# Patient Record
Sex: Male | Born: 1935 | Race: White | Hispanic: No | Marital: Married | Smoking: Former smoker
Health system: Southern US, Community
[De-identification: ages and names within clinical notes are randomized; demographics above are authoritative.]

## PROBLEM LIST (undated history)

## (undated) DIAGNOSIS — I4891 Unspecified atrial fibrillation: Secondary | ICD-10-CM

## (undated) DIAGNOSIS — N028 Recurrent and persistent hematuria with other morphologic changes: Secondary | ICD-10-CM

## (undated) DIAGNOSIS — D649 Anemia, unspecified: Secondary | ICD-10-CM

## (undated) DIAGNOSIS — I10 Essential (primary) hypertension: Secondary | ICD-10-CM

## (undated) DIAGNOSIS — I350 Nonrheumatic aortic (valve) stenosis: Secondary | ICD-10-CM

## (undated) DIAGNOSIS — H9190 Unspecified hearing loss, unspecified ear: Secondary | ICD-10-CM

## (undated) DIAGNOSIS — I2510 Atherosclerotic heart disease of native coronary artery without angina pectoris: Secondary | ICD-10-CM

## (undated) DIAGNOSIS — M1990 Unspecified osteoarthritis, unspecified site: Secondary | ICD-10-CM

## (undated) DIAGNOSIS — N02B9 Other recurrent and persistent immunoglobulin A nephropathy: Secondary | ICD-10-CM

## (undated) DIAGNOSIS — R011 Cardiac murmur, unspecified: Secondary | ICD-10-CM

## (undated) DIAGNOSIS — E119 Type 2 diabetes mellitus without complications: Secondary | ICD-10-CM

## (undated) DIAGNOSIS — E785 Hyperlipidemia, unspecified: Secondary | ICD-10-CM

## (undated) DIAGNOSIS — G510 Bell's palsy: Secondary | ICD-10-CM

## (undated) DIAGNOSIS — Z87442 Personal history of urinary calculi: Secondary | ICD-10-CM

## (undated) DIAGNOSIS — I499 Cardiac arrhythmia, unspecified: Secondary | ICD-10-CM

## (undated) DIAGNOSIS — N390 Urinary tract infection, site not specified: Secondary | ICD-10-CM

## (undated) DIAGNOSIS — I509 Heart failure, unspecified: Secondary | ICD-10-CM

## (undated) DIAGNOSIS — C9110 Chronic lymphocytic leukemia of B-cell type not having achieved remission: Secondary | ICD-10-CM

## (undated) HISTORY — DX: Type 2 diabetes mellitus without complications: E11.9

## (undated) HISTORY — DX: Other recurrent and persistent immunoglobulin A nephropathy: N02.B9

## (undated) HISTORY — DX: Hyperlipidemia, unspecified: E78.5

## (undated) HISTORY — DX: Unspecified atrial fibrillation: I48.91

## (undated) HISTORY — DX: Recurrent and persistent hematuria with other morphologic changes: N02.8

## (undated) HISTORY — DX: Essential (primary) hypertension: I10

## (undated) HISTORY — PX: TRANSURETHRAL RESECTION OF PROSTATE: SHX73

## (undated) HISTORY — DX: Chronic lymphocytic leukemia of B-cell type not having achieved remission: C91.10

## (undated) HISTORY — DX: Atherosclerotic heart disease of native coronary artery without angina pectoris: I25.10

## (undated) HISTORY — PX: ANAL FISSURE REPAIR: SHX2312

## (undated) HISTORY — PX: TONSILLECTOMY: SUR1361

## (undated) HISTORY — PX: FEMORAL HERNIA REPAIR: SHX632

---

## 2000-11-25 ENCOUNTER — Encounter: Payer: Self-pay | Admitting: Specialist

## 2000-11-25 ENCOUNTER — Ambulatory Visit: Admission: RE | Admit: 2000-11-25 | Discharge: 2000-11-25 | Payer: Self-pay | Admitting: Specialist

## 2001-01-11 ENCOUNTER — Inpatient Hospital Stay (HOSPITAL_COMMUNITY): Admission: RE | Admit: 2001-01-11 | Discharge: 2001-01-16 | Payer: Self-pay | Admitting: Specialist

## 2003-03-27 ENCOUNTER — Ambulatory Visit (HOSPITAL_COMMUNITY): Admission: RE | Admit: 2003-03-27 | Discharge: 2003-03-27 | Payer: Self-pay | Admitting: Orthopedic Surgery

## 2003-03-27 ENCOUNTER — Encounter: Payer: Self-pay | Admitting: Orthopedic Surgery

## 2004-01-18 ENCOUNTER — Ambulatory Visit (HOSPITAL_COMMUNITY): Admission: RE | Admit: 2004-01-18 | Discharge: 2004-01-18 | Payer: Self-pay | Admitting: Internal Medicine

## 2004-06-20 ENCOUNTER — Ambulatory Visit (HOSPITAL_COMMUNITY): Admission: RE | Admit: 2004-06-20 | Discharge: 2004-06-20 | Payer: Self-pay | Admitting: Internal Medicine

## 2004-09-02 ENCOUNTER — Ambulatory Visit (HOSPITAL_COMMUNITY): Admission: RE | Admit: 2004-09-02 | Discharge: 2004-09-02 | Payer: Self-pay | Admitting: General Surgery

## 2005-03-02 ENCOUNTER — Inpatient Hospital Stay (HOSPITAL_COMMUNITY): Admission: RE | Admit: 2005-03-02 | Discharge: 2005-03-04 | Payer: Self-pay | Admitting: Urology

## 2005-03-02 ENCOUNTER — Encounter (INDEPENDENT_AMBULATORY_CARE_PROVIDER_SITE_OTHER): Payer: Self-pay | Admitting: Urology

## 2006-08-23 ENCOUNTER — Inpatient Hospital Stay (HOSPITAL_COMMUNITY): Admission: EM | Admit: 2006-08-23 | Discharge: 2006-08-26 | Payer: Self-pay | Admitting: Emergency Medicine

## 2006-08-23 ENCOUNTER — Ambulatory Visit: Payer: Self-pay | Admitting: Orthopedic Surgery

## 2006-08-24 ENCOUNTER — Encounter: Payer: Self-pay | Admitting: Orthopedic Surgery

## 2006-09-02 ENCOUNTER — Ambulatory Visit: Payer: Self-pay | Admitting: Orthopedic Surgery

## 2006-09-20 ENCOUNTER — Ambulatory Visit: Payer: Self-pay | Admitting: Orthopedic Surgery

## 2006-10-05 ENCOUNTER — Ambulatory Visit: Payer: Self-pay | Admitting: Orthopedic Surgery

## 2006-10-18 ENCOUNTER — Ambulatory Visit: Payer: Self-pay | Admitting: Orthopedic Surgery

## 2006-11-18 ENCOUNTER — Ambulatory Visit: Payer: Self-pay | Admitting: Orthopedic Surgery

## 2007-06-03 ENCOUNTER — Encounter: Payer: Self-pay | Admitting: Orthopedic Surgery

## 2007-06-14 ENCOUNTER — Ambulatory Visit: Payer: Self-pay | Admitting: Orthopedic Surgery

## 2007-06-14 DIAGNOSIS — E119 Type 2 diabetes mellitus without complications: Secondary | ICD-10-CM

## 2007-06-14 DIAGNOSIS — M25569 Pain in unspecified knee: Secondary | ICD-10-CM

## 2007-07-05 ENCOUNTER — Ambulatory Visit: Payer: Self-pay | Admitting: Orthopedic Surgery

## 2007-08-12 ENCOUNTER — Encounter: Payer: Self-pay | Admitting: Orthopedic Surgery

## 2007-08-22 HISTORY — PX: OTHER SURGICAL HISTORY: SHX169

## 2008-05-01 ENCOUNTER — Ambulatory Visit: Payer: Self-pay | Admitting: Cardiology

## 2008-05-07 ENCOUNTER — Ambulatory Visit: Payer: Self-pay | Admitting: Cardiology

## 2008-05-17 ENCOUNTER — Inpatient Hospital Stay (HOSPITAL_BASED_OUTPATIENT_CLINIC_OR_DEPARTMENT_OTHER): Admission: RE | Admit: 2008-05-17 | Discharge: 2008-05-17 | Payer: Self-pay | Admitting: Cardiology

## 2008-05-17 ENCOUNTER — Ambulatory Visit: Payer: Self-pay | Admitting: Cardiovascular Disease

## 2008-05-21 ENCOUNTER — Ambulatory Visit: Payer: Self-pay | Admitting: Cardiology

## 2008-05-21 DIAGNOSIS — I4891 Unspecified atrial fibrillation: Secondary | ICD-10-CM

## 2008-05-21 DIAGNOSIS — E785 Hyperlipidemia, unspecified: Secondary | ICD-10-CM | POA: Insufficient documentation

## 2008-05-21 DIAGNOSIS — I2510 Atherosclerotic heart disease of native coronary artery without angina pectoris: Secondary | ICD-10-CM

## 2008-06-12 ENCOUNTER — Ambulatory Visit: Payer: Self-pay | Admitting: Cardiology

## 2008-06-19 ENCOUNTER — Ambulatory Visit: Payer: Self-pay | Admitting: Cardiology

## 2008-06-29 ENCOUNTER — Ambulatory Visit: Payer: Self-pay | Admitting: Cardiology

## 2008-07-13 ENCOUNTER — Ambulatory Visit: Payer: Self-pay | Admitting: Cardiology

## 2008-08-03 ENCOUNTER — Ambulatory Visit: Payer: Self-pay | Admitting: Cardiology

## 2008-09-05 ENCOUNTER — Ambulatory Visit: Payer: Self-pay

## 2008-10-05 ENCOUNTER — Ambulatory Visit: Payer: Self-pay | Admitting: Cardiology

## 2008-11-02 ENCOUNTER — Ambulatory Visit: Payer: Self-pay | Admitting: Cardiology

## 2008-11-19 ENCOUNTER — Encounter: Payer: Self-pay | Admitting: *Deleted

## 2008-11-30 ENCOUNTER — Ambulatory Visit: Payer: Self-pay | Admitting: Cardiology

## 2008-11-30 LAB — CONVERTED CEMR LAB
POC INR: 2
Prothrombin Time: 17.5 s

## 2008-12-21 ENCOUNTER — Ambulatory Visit: Payer: Self-pay | Admitting: Cardiology

## 2008-12-21 LAB — CONVERTED CEMR LAB: POC INR: 2

## 2009-01-16 ENCOUNTER — Encounter (INDEPENDENT_AMBULATORY_CARE_PROVIDER_SITE_OTHER): Payer: Self-pay | Admitting: *Deleted

## 2009-01-18 ENCOUNTER — Ambulatory Visit: Payer: Self-pay | Admitting: Cardiology

## 2009-02-08 ENCOUNTER — Telehealth (INDEPENDENT_AMBULATORY_CARE_PROVIDER_SITE_OTHER): Payer: Self-pay | Admitting: *Deleted

## 2009-02-26 ENCOUNTER — Ambulatory Visit: Payer: Self-pay | Admitting: Cardiology

## 2009-03-22 ENCOUNTER — Ambulatory Visit: Payer: Self-pay | Admitting: Cardiology

## 2009-03-22 LAB — CONVERTED CEMR LAB: POC INR: 2.6

## 2009-04-19 ENCOUNTER — Ambulatory Visit: Payer: Self-pay | Admitting: Cardiology

## 2009-05-17 ENCOUNTER — Ambulatory Visit: Payer: Self-pay | Admitting: Cardiology

## 2009-06-14 ENCOUNTER — Ambulatory Visit: Payer: Self-pay | Admitting: Cardiology

## 2009-06-14 LAB — CONVERTED CEMR LAB: POC INR: 3

## 2009-07-12 ENCOUNTER — Ambulatory Visit: Payer: Self-pay | Admitting: Cardiology

## 2009-07-12 LAB — CONVERTED CEMR LAB: POC INR: 2.2

## 2009-08-20 ENCOUNTER — Ambulatory Visit: Payer: Self-pay | Admitting: Cardiology

## 2009-08-20 LAB — CONVERTED CEMR LAB: POC INR: 2.1

## 2009-09-20 ENCOUNTER — Ambulatory Visit: Payer: Self-pay | Admitting: Cardiology

## 2009-09-20 LAB — CONVERTED CEMR LAB: POC INR: 1.9

## 2009-10-22 ENCOUNTER — Ambulatory Visit: Payer: Self-pay | Admitting: Cardiology

## 2009-10-22 LAB — CONVERTED CEMR LAB: POC INR: 1.8

## 2009-11-15 ENCOUNTER — Ambulatory Visit: Payer: Self-pay | Admitting: Cardiology

## 2009-11-15 LAB — CONVERTED CEMR LAB: POC INR: 2

## 2009-12-13 ENCOUNTER — Ambulatory Visit: Payer: Self-pay | Admitting: Cardiology

## 2010-01-10 ENCOUNTER — Ambulatory Visit: Payer: Self-pay | Admitting: Cardiology

## 2010-02-07 ENCOUNTER — Ambulatory Visit: Payer: Self-pay | Admitting: Cardiology

## 2010-02-07 LAB — CONVERTED CEMR LAB: POC INR: 3.4

## 2010-03-07 ENCOUNTER — Ambulatory Visit: Payer: Self-pay | Admitting: Cardiology

## 2010-04-04 ENCOUNTER — Ambulatory Visit: Payer: Self-pay | Admitting: Cardiology

## 2010-05-02 ENCOUNTER — Ambulatory Visit: Admission: RE | Admit: 2010-05-02 | Discharge: 2010-05-02 | Payer: Self-pay | Source: Home / Self Care

## 2010-05-02 LAB — CONVERTED CEMR LAB: POC INR: 2.2

## 2010-05-06 NOTE — Medication Information (Signed)
Summary: ccr-lr  Anticoagulant Therapy  Managed by: Vashti Hey, RN Supervising MD: Diona Browner MD, Remi Deter Indication 1: Atrial Fibrillation (ICD-427.31) [facility] [place] Site: [place] INR POC 2.2  Dietary changes: no    Health status changes: no    Bleeding/hemorrhagic complications: no    Recent/future hospitalizations: no    Any changes in medication regimen? no    Recent/future dental: no  Any missed doses?: no       Is patient compliant with meds? yes       Allergies: 1)  ! * Penicillin  Anticoagulation Management History:      The patient is taking warfarin and comes in today for a routine follow up visit.  Positive risk factors for bleeding include an age of 74 years or older and presence of serious comorbidities.  The bleeding index is 'intermediate risk'.  Positive CHADS2 values include History of Diabetes.  Negative CHADS2 values include Age > 74 years old.  The start date was 06/08/2008.  Anticoagulation responsible provider: Diona Browner MD, Remi Deter.  INR POC: 2.2.  Cuvette Lot#: [id number].  Exp: 07/10.    Anticoagulation Management Assessment/Plan:      The patient's current anticoagulation dose is Coumadin 5 mg tabs: Take 1-2 tablet by mouth once a day as directed. Contact our office for an appt..  The target INR is 2 - 3.  The next INR is due 05/17/2009.  Anticoagulation instructions were given to patient.  Results were reviewed/authorized by Vashti Hey, RN.  He was notified by Vashti Hey RN.         Prior Anticoagulation Instructions: INR 2.6 Continue coumadin 5mg  once daily except 7.5mg  on Tuesdays and Thursdays  Current Anticoagulation Instructions: INR 2.2 continue coumadin 5mg  once daily except 7.5mg  on Tuesdays and Thursdays

## 2010-05-06 NOTE — Medication Information (Signed)
Summary: ccr-lr  Anticoagulant Therapy  Managed by: Vashti Hey, RN Supervising MD: Diona Browner MD, Remi Deter Indication 1: Atrial Fibrillation (ICD-427.31) [facility] [place] Site: [place] INR POC 2.2  Dietary changes: no    Health status changes: no    Bleeding/hemorrhagic complications: no    Recent/future hospitalizations: no    Any changes in medication regimen? no    Recent/future dental: no  Any missed doses?: no       Is patient compliant with meds? yes       Allergies: 1)  ! * Penicillin  Anticoagulation Management History:      The patient is taking warfarin and comes in today for a routine follow up visit.  Positive risk factors for bleeding include an age of 76 years or older and presence of serious comorbidities.  The bleeding index is 'intermediate risk'.  Positive CHADS2 values include History of Diabetes.  Negative CHADS2 values include Age > 53 years old.  The start date was 06/08/2008.  Anticoagulation responsible provider: Diona Browner MD, Remi Deter.  INR POC: 2.2.  Cuvette Lot#: [id number].  Exp: 07/10.    Anticoagulation Management Assessment/Plan:      The patient's current anticoagulation dose is Coumadin 5 mg tabs: Take 1-2 tablet by mouth once a day as directed. Contact our office for an appt..  The target INR is 2 - 3.  The next INR is due 08/09/2009.  Anticoagulation instructions were given to patient.  Results were reviewed/authorized by Vashti Hey, RN.  He was notified by Vashti Hey RN.         Prior Anticoagulation Instructions: INR 3.0 Continue coumadin 5mg  once daily except 7.5mg  on Tuesdays and Thursdays  Current Anticoagulation Instructions: INR 2.2 Continue coumadin 5mg  once daily except 7.5mg  on T,Th

## 2010-05-06 NOTE — Medication Information (Signed)
Summary: ccr-lr  Anticoagulant Therapy  Managed by: Vashti Hey, RN Supervising MD: Myrtis Ser MD, Tinnie Gens Indication 1: Atrial Fibrillation (ICD-427.31) Lab [facility] [place] Site: [place] INR POC 1.8  Dietary changes: no    Health status changes: no    Bleeding/hemorrhagic complications: no    Recent/future hospitalizations: no    Any changes in medication regimen? no    Recent/future dental: no  Any missed doses?: no       Is patient compliant with meds? yes       Allergies: 1)  ! * Penicillin  Anticoagulation Management History:      The patient is taking warfarin and comes in today for a routine follow up visit.  Positive risk factors for bleeding include an age of 20 years or older and presence of serious comorbidities.  The bleeding index is 'intermediate risk'.  Positive CHADS2 values include History of Diabetes.  Negative CHADS2 values include Age > 22 years old.  The start date was 06/08/2008.  Anticoagulation responsible provider: Myrtis Ser MD, Tinnie Gens.  INR POC: 1.8.  Cuvette Lot#: [id number].  Exp: 07/10.    Anticoagulation Management Assessment/Plan:      The patient's current anticoagulation dose is Coumadin 5 mg tabs: Take 1-2 tablet by mouth once a day as directed. Contact our office for an appt..  The target INR is 2 - 3.  The next INR is due 11/15/2009.  Anticoagulation instructions were given to patient.  Results were reviewed/authorized by Vashti Hey, RN.  He was notified by Vashti Hey RN.         Prior Anticoagulation Instructions: INR 1.9 Take coumadin 10mg  tonight then resume 5mg  once daily except 7.5mg  on Tuesdays and Thursdays  Current Anticoagulation Instructions: INR 1.8 Take coumadin 10mg  tonight then increase dose to 5mg  once daily except 7.5mg  on T,Th,Sat

## 2010-05-06 NOTE — Medication Information (Signed)
Summary: ccr-lr  Anticoagulant Therapy  Managed by: Vashti Hey, RN Supervising MD: Diona Browner MD, Remi Deter Indication 1: Atrial Fibrillation (ICD-427.31) Lab [facility] [place] Site: [place] INR POC 3.0  Dietary changes: no    Health status changes: no    Bleeding/hemorrhagic complications: no    Recent/future hospitalizations: no    Any changes in medication regimen? no    Recent/future dental: no  Any missed doses?: no       Is patient compliant with meds? yes       Allergies: 1)  ! * Penicillin  Anticoagulation Management History:      The patient is taking warfarin and comes in today for a routine follow up visit.  Positive risk factors for bleeding include an age of 76 years or older and presence of serious comorbidities.  The bleeding index is 'intermediate risk'.  Positive CHADS2 values include History of Diabetes.  Negative CHADS2 values include Age > 21 years old.  The start date was 06/08/2008.  Anticoagulation responsible provider: Diona Browner MD, Remi Deter.  INR POC: 3.0.  Cuvette Lot#: [id number].  Exp: 07/10.    Anticoagulation Management Assessment/Plan:      The patient's current anticoagulation dose is Coumadin 5 mg tabs: Take 1-2 tablet by mouth once a day as directed. Contact our office for an appt..  The target INR is 2 - 3.  The next INR is due 07/12/2009.  Anticoagulation instructions were given to patient.  Results were reviewed/authorized by Vashti Hey, RN.         Prior Anticoagulation Instructions: INR 2.7 Continue coumadin 5mg  once daily except 7.5mg  on Tuesdays and Thursdays  Current Anticoagulation Instructions: INR 3.0 Continue coumadin 5mg  once daily except 7.5mg  on Tuesdays and Thursdays

## 2010-05-06 NOTE — Medication Information (Signed)
Summary: ccr-lr  Anticoagulant Therapy  Managed by: Vashti Hey, RN Supervising MD: Andee Lineman MD, Michelle Piper Indication 1: Atrial Fibrillation (ICD-427.31) Lab [facility] [place] Site: [place] INR POC 1.9  Dietary changes: no    Health status changes: no    Bleeding/hemorrhagic complications: no    Recent/future hospitalizations: no    Any changes in medication regimen? no    Recent/future dental: no  Any missed doses?: no       Is patient compliant with meds? yes       Allergies: 1)  ! * Penicillin  Anticoagulation Management History:      The patient is taking warfarin and comes in today for a routine follow up visit.  Positive risk factors for bleeding include an age of 76 years or older and presence of serious comorbidities.  The bleeding index is 'intermediate risk'.  Positive CHADS2 values include History of Diabetes.  Negative CHADS2 values include Age > 49 years old.  The start date was 06/08/2008.  Anticoagulation responsible provider: Andee Lineman MD, Michelle Piper.  INR POC: 1.9.  Cuvette Lot#: [id number].  Exp: 07/10.    Anticoagulation Management Assessment/Plan:      The patient's current anticoagulation dose is Coumadin 5 mg tabs: Take 1-2 tablet by mouth once a day as directed. Contact our office for an appt..  The target INR is 2 - 3.  The next INR is due 10/22/2009.  Anticoagulation instructions were given to patient.  Results were reviewed/authorized by Vashti Hey, RN.  He was notified by Vashti Hey RN.         Prior Anticoagulation Instructions: INR 2.1 Continue coumadin 5mg  once daily except 7.5mg  on Tuesdays and Thursdays  Current Anticoagulation Instructions: INR 1.9 Take coumadin 10mg  tonight then resume 5mg  once daily except 7.5mg  on Tuesdays and Thursdays

## 2010-05-06 NOTE — Medication Information (Signed)
Summary: ccr-lr  Anticoagulant Therapy  Managed by: Vashti Hey, RN Supervising MD: Diona Browner MD, Remi Deter Indication 1: Atrial Fibrillation (ICD-427.31) [facility]  Site: [place] INR POC 2.8  Dietary changes: no    Health status changes: no    Bleeding/hemorrhagic complications: no    Recent/future hospitalizations: no    Any changes in medication regimen? no    Recent/future dental: no  Any missed doses?: no       Is patient compliant with meds? yes       Allergies: 1)  ! * Penicillin  Anticoagulation Management History:      The patient is taking warfarin and comes in today for a routine follow up visit.  Positive risk factors for bleeding include an age of 28 years or older and presence of serious comorbidities.  The bleeding index is 'intermediate risk'.  Positive CHADS2 values include History of Diabetes.  Negative CHADS2 values include Age > 52 years old.  The start date was 06/08/2008.  Anticoagulation responsible provider: Diona Browner MD, Remi Deter.  INR POC: 2.8.  Cuvette Lot#: [id number].  Exp: 07/10.    Anticoagulation Management Assessment/Plan:      The patient's current anticoagulation dose is Coumadin 5 mg tabs: Take 1-2 tablet by mouth once a day as directed. Contact our office for an appt..  The target INR is 2 - 3.  The next INR is due 04/04/2010.  Anticoagulation instructions were given to patient.  Results were reviewed/authorized by Vashti Hey, RN.  He was notified by Vashti Hey RN.         Prior Anticoagulation Instructions: INR 3.4 Hold coumadin tonight then resume 7.5mg  once daily except 5mg  on M,W,F  Current Anticoagulation Instructions: INR 2.8 Continue coumadin 7.5mg  once daily except 5mg  on M,W,F

## 2010-05-06 NOTE — Medication Information (Signed)
Summary: ccr-lr  Anticoagulant Therapy  Managed by: Vashti Hey, RN Supervising MD: Diona Browner MD, Remi Deter Indication 1: Atrial Fibrillation (ICD-427.31) [facility]  Site: [place] INR POC 2.1  Dietary changes: no    Health status changes: no    Bleeding/hemorrhagic complications: no    Recent/future hospitalizations: no    Any changes in medication regimen? no    Recent/future dental: no  Any missed doses?: no       Is patient compliant with meds? yes       Allergies: 1)  ! * Penicillin  Anticoagulation Management History:      The patient is taking warfarin and comes in today for a routine follow up visit.  Positive risk factors for bleeding include an age of 76 years or older and presence of serious comorbidities.  The bleeding index is 'intermediate risk'.  Positive CHADS2 values include History of Diabetes.  Negative CHADS2 values include Age > 8 years old.  The start date was 06/08/2008.  Anticoagulation responsible provider: Diona Browner MD, Remi Deter.  INR POC: 2.1.  Cuvette Lot#: [id number].  Exp: 07/10.    Anticoagulation Management Assessment/Plan:      The patient's current anticoagulation dose is Coumadin 5 mg tabs: Take 1-2 tablet by mouth once a day as directed. Contact our office for an appt..  The target INR is 2 - 3.  The next INR is due 09/20/2009.  Anticoagulation instructions were given to patient.  Results were reviewed/authorized by Vashti Hey, RN.  He was notified by Vashti Hey RN.         Prior Anticoagulation Instructions: INR 2.2 Continue coumadin 5mg  once daily except 7.5mg  on T,Th  Current Anticoagulation Instructions: INR 2.1 Continue coumadin 5mg  once daily except 7.5mg  on Tuesdays and Thursdays

## 2010-05-06 NOTE — Medication Information (Signed)
Summary: ccr-lr  Anticoagulant Therapy  Managed by: Vashti Hey, RN Supervising MD: Antoine Poche MD, Fayrene Fearing Indication 1: Atrial Fibrillation (ICD-427.31) Lab [facility] [place] Site: [place] INR POC 2.4  Dietary changes: no    Health status changes: no    Bleeding/hemorrhagic complications: no    Recent/future hospitalizations: no    Any changes in medication regimen? no    Recent/future dental: no  Any missed doses?: no       Is patient compliant with meds? yes       Allergies: 1)  ! * Penicillin  Anticoagulation Management History:      The patient is taking warfarin and comes in today for a routine follow up visit.  Positive risk factors for bleeding include an age of 76 years or older and presence of serious comorbidities.  The bleeding index is 'intermediate risk'.  Positive CHADS2 values include History of Diabetes.  Negative CHADS2 values include Age > 74 years old.  The start date was 06/08/2008.  Anticoagulation responsible Velma Agnes: Antoine Poche MD, Fayrene Fearing.  INR POC: 2.4.  Cuvette Lot#: [id number].  Exp: 07/10.    Anticoagulation Management Assessment/Plan:      The patient's current anticoagulation dose is Coumadin 5 mg tabs: Take 1-2 tablet by mouth once a day as directed. Contact our office for an appt..  The target INR is 2 - 3.  The next INR is due 02/07/2010.  Anticoagulation instructions were given to patient.  Results were reviewed/authorized by Vashti Hey, RN.  He was notified by Vashti Hey RN.         Prior Anticoagulation Instructions: INR 2.2 Continue coumadin 7.5mg  once daily except 5mg  on M,W,F  Current Anticoagulation Instructions: INR 2.4 Continue coumadin 7.5mg  once daily exept 5mg  on M,W,F

## 2010-05-06 NOTE — Medication Information (Signed)
Summary: ccr-lr  Anticoagulant Therapy  Managed by: Vashti Hey, RN Supervising MD: Andee Lineman MD, Michelle Piper Indication 1: Atrial Fibrillation (ICD-427.31) Lab [facility] [place] Site: [place] INR POC 3.4  Dietary changes: no    Health status changes: no    Bleeding/hemorrhagic complications: no    Recent/future hospitalizations: no    Any changes in medication regimen? no    Recent/future dental: no  Any missed doses?: no       Is patient compliant with meds? yes       Allergies: 1)  ! * Penicillin  Anticoagulation Management History:      The patient is taking warfarin and comes in today for a routine follow up visit.  Positive risk factors for bleeding include an age of 68 years or older and presence of serious comorbidities.  The bleeding index is 'intermediate risk'.  Positive CHADS2 values include History of Diabetes.  Negative CHADS2 values include Age > 66 years old.  The start date was 06/08/2008.  Anticoagulation responsible Tekeya Geffert: Andee Lineman MD, Michelle Piper.  INR POC: 3.4.  Cuvette Lot#: [id number].  Exp: 07/10.    Anticoagulation Management Assessment/Plan:      The patient's current anticoagulation dose is Coumadin 5 mg tabs: Take 1-2 tablet by mouth once a day as directed. Contact our office for an appt..  The target INR is 2 - 3.  The next INR is due 03/07/2010.  Anticoagulation instructions were given to patient.  Results were reviewed/authorized by Vashti Hey, RN.  He was notified by Vashti Hey RN.         Prior Anticoagulation Instructions: INR 2.4 Continue coumadin 7.5mg  once daily exept 5mg  on M,W,F  Current Anticoagulation Instructions: INR 3.4 Hold coumadin tonight then resume 7.5mg  once daily except 5mg  on M,W,F

## 2010-05-06 NOTE — Medication Information (Signed)
Summary: ccr-lr  Anticoagulant Therapy  Managed by: [name] Hey, RN Supervising MD: Myrtis Ser MD, Tinnie Gens Indication 1: Atrial Fibrillation (ICD-427.31) Lab [facility] [place] Site: [place] INR POC 2.7  Dietary changes: no    Health status changes: no    Bleeding/hemorrhagic complications: no    Recent/future hospitalizations: no    Any changes in medication regimen? no    Recent/future dental: no  Any missed doses?: no       Is patient compliant with meds? yes       Allergies: 1)  ! * Penicillin  Anticoagulation Management History:      The patient is taking warfarin and comes in today for a routine follow up visit.  Positive risk factors for bleeding include an age of 76 years or older and presence of serious comorbidities.  The bleeding index is 'intermediate risk'.  Positive CHADS2 values include History of Diabetes.  Negative CHADS2 values include Age > 9 years old.  The start date was 06/08/2008.  Anticoagulation responsible provider: Myrtis Ser MD, Tinnie Gens.  INR POC: 2.7.  Cuvette Lot#: [id number].  Exp: 07/10.    Anticoagulation Management Assessment/Plan:      The patient's current anticoagulation dose is Coumadin 5 mg tabs: Take 1-2 tablet by mouth once a day as directed. Contact our office for an appt..  The target INR is 2 - 3.  The next INR is due 06/14/2009.  Anticoagulation instructions were given to patient.  Results were reviewed/authorized by [name] Hey, RN.  He was notified by [name] Hey RN.         Prior Anticoagulation Instructions: INR 2.2 continue coumadin 5mg  once daily except 7.5mg  on Tuesdays and Thursdays  Current Anticoagulation Instructions: INR 2.7 Continue coumadin 5mg  once daily except 7.5mg  on Tuesdays and Thursdays Prescriptions: COUMADIN 5 MG TABS (WARFARIN SODIUM) Take 1-2 tablet by mouth once a day as directed. Contact our office for an appt.  #90 x 2   Entered by:   [name] Hey RN   Authorized by:   Lewayne Bunting, MD, [facility]   Signed by:    [name] Hey RN on 05/17/2009   Method used:   Electronically to        [facility] (retail)       [address]       [place], [place]  [address]       Ph: [phone]       Fax: [phone]   RxID:   [id number]

## 2010-05-06 NOTE — Medication Information (Signed)
Summary: ccr-lr  Anticoagulant Therapy  Managed by: Vashti Hey, RN Supervising MD: Myrtis Ser MD, Tinnie Gens Indication 1: Atrial Fibrillation (ICD-427.31) Lab [facility] [place] Site: [place] INR POC 2.2  Dietary changes: no    Health status changes: no    Bleeding/hemorrhagic complications: no    Recent/future hospitalizations: no    Any changes in medication regimen? no    Recent/future dental: no  Any missed doses?: no       Is patient compliant with meds? yes       Allergies: 1)  ! * Penicillin  Anticoagulation Management History:      The patient is taking warfarin and comes in today for a routine follow up visit.  Positive risk factors for bleeding include an age of 76 years or older and presence of serious comorbidities.  The bleeding index is 'intermediate risk'.  Positive CHADS2 values include History of Diabetes.  Negative CHADS2 values include Age > 74 years old.  The start date was 06/08/2008.  Anticoagulation responsible [place]: Myrtis Ser MD, Tinnie Gens.  INR POC: 2.2.  Exp: 07/10.    Anticoagulation Management Assessment/Plan:      The patient's current anticoagulation dose is Coumadin 5 mg tabs: Take 1-2 tablet by mouth once a day as directed. Contact our office for an appt..  The target INR is 2 - 3.  The next INR is due 01/10/2010.  Anticoagulation instructions were given to patient.  Results were reviewed/authorized by Vashti Hey, RN.  He was notified by Vashti Hey RN.         Prior Anticoagulation Instructions: INR 2.0 Increase coumadin to 7.5mg  once daily except 5mg  on M,W,F  Current Anticoagulation Instructions: INR 2.2 Continue coumadin 7.5mg  once daily except 5mg  on M,W,F Prescriptions: COUMADIN 5 MG TABS (WARFARIN SODIUM) Take 1-2 tablet by mouth once a day as directed. Contact our office for an appt.  #90 x 2   Entered by:   Vashti Hey RN   Authorized by:   Lewayne Bunting, MD, [facility]   Signed by:   Vashti Hey RN on 12/13/2009   Method used:    Electronically to        [facility] (retail)       [address]       [place], [place]  [address]       Ph: [phone]       Fax: [phone]   RxID:   [id number]

## 2010-05-06 NOTE — Medication Information (Signed)
Summary: ccr-lr  Anticoagulant Therapy  Managed by: Vashti Hey, RN Supervising MD: Myrtis Ser MD, Tinnie Gens Indication 1: Atrial Fibrillation (ICD-427.31) Lab [facility] [place] Site: [place] INR POC 2.0  Dietary changes: no    Health status changes: no    Bleeding/hemorrhagic complications: no    Recent/future hospitalizations: no    Any changes in medication regimen? no    Recent/future dental: no  Any missed doses?: no       Is patient compliant with meds? yes       Allergies: 1)  ! * Penicillin  Anticoagulation Management History:      The patient is taking warfarin and comes in today for a routine follow up visit.  Positive risk factors for bleeding include an age of 76 years or older and presence of serious comorbidities.  The bleeding index is 'intermediate risk'.  Positive CHADS2 values include History of Diabetes.  Negative CHADS2 values include Age > 55 years old.  The start date was 06/08/2008.  Anticoagulation responsible provider: Myrtis Ser MD, Tinnie Gens.  INR POC: 2.0.  Cuvette Lot#: [id number].  Exp: 07/10.    Anticoagulation Management Assessment/Plan:      The patient's current anticoagulation dose is Coumadin 5 mg tabs: Take 1-2 tablet by mouth once a day as directed. Contact our office for an appt..  The target INR is 2 - 3.  The next INR is due 12/13/2009.  Anticoagulation instructions were given to patient.  Results were reviewed/authorized by Vashti Hey, RN.  He was notified by Vashti Hey RN.         Prior Anticoagulation Instructions: INR 1.8 Take coumadin 10mg  tonight then increase dose to 5mg  once daily except 7.5mg  on T,Th,Sat  Current Anticoagulation Instructions: INR 2.0 Increase coumadin to 7.5mg  once daily except 5mg  on M,W,F

## 2010-05-08 NOTE — Medication Information (Signed)
Summary: ccr-lr  Anticoagulant Therapy  Managed by: Vashti Hey, RN Supervising MD: Andee Lineman MD, Michelle Piper Indication 1: Atrial Fibrillation (ICD-427.31) Lab [facility] [place] Site: [place] INR POC 2.2  Dietary changes: no    Health status changes: no    Bleeding/hemorrhagic complications: no    Recent/future hospitalizations: no    Any changes in medication regimen? no    Recent/future dental: no  Any missed doses?: no       Is patient compliant with meds? yes       Allergies: 1)  ! * Penicillin  Anticoagulation Management History:      The patient is taking warfarin and comes in today for a routine follow up visit.  Positive risk factors for bleeding include an age of 76 years or older and presence of serious comorbidities.  The bleeding index is 'intermediate risk'.  Positive CHADS2 values include History of Diabetes.  Negative CHADS2 values include Age > 48 years old.  The start date was 06/08/2008.  Anticoagulation responsible provider: Andee Lineman MD, Michelle Piper.  INR POC: 2.2.  Cuvette Lot#: [id number].  Exp: 07/10.    Anticoagulation Management Assessment/Plan:      The patient's current anticoagulation dose is Coumadin 5 mg tabs: Take 1-2 tablet by mouth once a day as directed. Contact our office for an appt..  The target INR is 2 - 3.  The next INR is due 05/30/2010.  Anticoagulation instructions were given to patient.  Results were reviewed/authorized by Vashti Hey, RN.  He was notified by Vashti Hey RN.         Prior Anticoagulation Instructions: INR 2.2 Continue coumadin 7.5mg  once daily except 5mg  on M,W,F  Current Anticoagulation Instructions: Same as Prior Instructions.

## 2010-05-08 NOTE — Medication Information (Signed)
Summary: ccr-lr  Anticoagulant Therapy  Managed by: Vashti Hey, RN Supervising MD: Diona Browner MD, Remi Deter Indication 1: Atrial Fibrillation (ICD-427.31) Lab Used: [facility] [place] Site: [place] INR POC 2.2  Dietary changes: no    Health status changes: no    Bleeding/hemorrhagic complications: no    Recent/future hospitalizations: no    Any changes in medication regimen? no    Recent/future dental: no  Any missed doses?: no       Is patient compliant with meds? yes       Allergies: 1)  ! * Penicillin  Anticoagulation Management History:      The patient is taking warfarin and comes in today for a routine follow up visit.  Positive risk factors for bleeding include an age of 74 years or older and presence of serious comorbidities.  The bleeding index is 'intermediate risk'.  Positive CHADS2 values include History of Diabetes.  Negative CHADS2 values include Age > 74 years old.  The start date was 06/08/2008.  Anticoagulation responsible Fran Mcree: Diona Browner MD, Remi Deter.  INR POC: 2.2.  Cuvette Lot#: [id number].  Exp: 07/10.    Anticoagulation Management Assessment/Plan:      The patient's current anticoagulation dose is Coumadin 5 mg tabs: Take 1-2 tablet by mouth once a day as directed. Contact our office for an appt..  The target INR is 2 - 3.  The next INR is due 05/02/2010.  Anticoagulation instructions were given to patient.  Results were reviewed/authorized by Vashti Hey, RN.  He was notified by Vashti Hey RN.         Prior Anticoagulation Instructions: INR 2.8 Continue coumadin 7.5mg  once daily except 5mg  on M,W,F  Current Anticoagulation Instructions: INR 2.2 Continue coumadin 7.5mg  once daily except 5mg  on M,W,F

## 2010-05-30 ENCOUNTER — Encounter (INDEPENDENT_AMBULATORY_CARE_PROVIDER_SITE_OTHER): Payer: Medicare Other

## 2010-05-30 ENCOUNTER — Encounter: Payer: Self-pay | Admitting: Cardiology

## 2010-05-30 DIAGNOSIS — Z7901 Long term (current) use of anticoagulants: Secondary | ICD-10-CM

## 2010-05-30 DIAGNOSIS — I4891 Unspecified atrial fibrillation: Secondary | ICD-10-CM

## 2010-05-30 LAB — CONVERTED CEMR LAB: POC INR: 2.7

## 2010-06-03 NOTE — Medication Information (Signed)
Summary: ccr-lr  Anticoagulant Therapy  Managed by: Vashti Hey, RN Supervising MD: Diona Browner MD, Remi Deter Indication 1: Atrial Fibrillation (ICD-427.31) Lab [facility]: [facility] [place] Site: [place] INR POC 2.7  Dietary changes: no    Health status changes: no    Bleeding/hemorrhagic complications: no    Recent/future hospitalizations: no    Any changes in medication regimen? no    Recent/future dental: no  Any missed doses?: no       Is patient compliant with meds? yes       Allergies: 1)  ! * Penicillin  Anticoagulation Management History:      The patient is taking warfarin and comes in today for a routine follow up visit.  Positive risk factors for bleeding include an age of 26 years or older and presence of serious comorbidities.  The bleeding index is 'intermediate risk'.  Positive CHADS2 values include History of Diabetes.  Negative CHADS2 values include Age > 63 years old.  The start date was 06/08/2008.  Anticoagulation responsible provider: Diona Browner MD, Remi Deter.  INR POC: 2.7.  Cuvette Lot#: [id number].  Exp: 07/10.    Anticoagulation Management Assessment/Plan:      The patient's current anticoagulation dose is Coumadin 5 mg tabs: Take 1-2 tablet by mouth once a day as directed. Contact our office for an appt..  The target INR is 2 - 3.  The next INR is due 06/27/2010.  Anticoagulation instructions were given to patient.  Results were reviewed/authorized by Vashti Hey, RN.  He was notified by Vashti Hey RN.         Prior Anticoagulation Instructions: INR 2.2 Continue coumadin 7.5mg  once daily except 5mg  on M,W,F  Current Anticoagulation Instructions: INR 2.7 Continue coumadin 7.5mg  once daily except 5mg  on M,W,F

## 2010-06-25 ENCOUNTER — Encounter: Payer: Self-pay | Admitting: *Deleted

## 2010-06-25 DIAGNOSIS — Z7901 Long term (current) use of anticoagulants: Secondary | ICD-10-CM

## 2010-06-25 DIAGNOSIS — I4891 Unspecified atrial fibrillation: Secondary | ICD-10-CM

## 2010-06-27 ENCOUNTER — Ambulatory Visit (INDEPENDENT_AMBULATORY_CARE_PROVIDER_SITE_OTHER): Payer: Medicare Other | Admitting: *Deleted

## 2010-06-27 DIAGNOSIS — I4891 Unspecified atrial fibrillation: Secondary | ICD-10-CM

## 2010-06-27 DIAGNOSIS — Z7901 Long term (current) use of anticoagulants: Secondary | ICD-10-CM

## 2010-07-04 ENCOUNTER — Other Ambulatory Visit: Payer: Self-pay | Admitting: *Deleted

## 2010-07-04 MED ORDER — WARFARIN SODIUM 5 MG PO TABS: 5.0000 mg | ORAL_TABLET | ORAL | Status: DC

## 2010-07-22 LAB — POCT I-STAT GLUCOSE: Operator id: 221371

## 2010-07-29 ENCOUNTER — Ambulatory Visit (INDEPENDENT_AMBULATORY_CARE_PROVIDER_SITE_OTHER): Payer: Medicare Other | Admitting: *Deleted

## 2010-07-29 DIAGNOSIS — Z7901 Long term (current) use of anticoagulants: Secondary | ICD-10-CM

## 2010-07-29 DIAGNOSIS — I4891 Unspecified atrial fibrillation: Secondary | ICD-10-CM

## 2010-08-19 NOTE — Assessment & Plan Note (Signed)
[facility]                          [facility] NOTE   [name] [name], [name] [name]                        MRN:          [id number]  DATE:05/21/2008                            DOB:          03-21-1935    PRIMARY CARE PHYSICIAN:  Kirstie Peri, MD   REASON FOR VISIT:  Followup cardiac catheterization.   HISTORY OF PRESENT ILLNESS:  I saw [name] [name] as a new patient earlier  this month.  His history is detailed in the previous note by [name] [name].  Followup coronary angiography was actually very reassuring.  He was  noted to have only mild non-obstructive coronary atherosclerosis with  30% stenosis in the left anterior descending, 40% stenosis in the  circumflex, and luminal irregularities in the right coronary artery.  Ejection fraction was 55%.  Based on this, we have recommended risk  factor modification strategies.  At his last visit, [name] [name] and I did  discuss his permanent atrial fibrillation and indications for Coumadin  with a CHADS2 score of 2 (3 when he reaches a 77).  He also has a  systolic murmur at the base with no recent echocardiogram.  We talked  about the risk-benefit profile and our plan will be for him to enroll in  our Coumadin Clinic after [place] from the beach and have a followup  echocardiogram as well.   ALLERGIES:  PENICILLIN.   PRESENT MEDICATIONS:  1. Amaryl 4 mg p.o. b.i.d.  2. Lanoxin 0.25 mg p.o. daily.  3. Norvasc 5 mg p.o. daily.  4. Aspirin 81 mg p.o. b.i.d.  5. Vitamin E 400 international units daily.  6. Hydrochlorothiazide 12.5 mg p.o. q.a.m.  7. Multivitamin.  8. Co Q10 daily.  9. Calcium with vitamin D 600 mg p.o. b.i.d.  10.Omega-3 supplements 1000 mg 2 tablets p.o. b.i.d.  11.Lisinopril 40 mg p.o. daily.  12.Lipitor 10 mg p.o. daily.  13.Januvia 100 mg p.o. daily.  14.Actos 30 mg p.o. nightly.  15.Ativan 1 mg p.o. nightly.  16.Levemir 22 units subcu q.a.m.   REVIEW OF SYSTEMS:  As described in  the history of present illness,  otherwise negative.   PHYSICAL EXAMINATION:  VITAL SIGNS:  Blood pressure is 120/59, heart  rate is 72 and irregular, and weight is 179 pounds.  GENERAL:  The patient is comfortable and in no acute distress.  HEENT:  Conjunctiva is normal.  Oropharynx is clear.  NECK:  Supple.  No elevated jugular venous pressure.  No loud bruits.  No thyromegaly is noted.  LUNGS:  Clear without labored breathing at rest.  CARDIAC:  Irregularly irregular rhythm with a harsh 2-3/6 systolic  murmur heard best at the base.  No S3 gallop or diastolic murmur.  ABDOMEN:  Soft, nontender, normoactive bowel sounds.  EXTREMITIES:  1+ to 2+ distal pulses, some venous stasis and  varicosities noted.  No pitting edema.  SKIN:  Warm and dry.  MUSCULOSKELETAL:  No kyphosis noted.  NEUROPSYCHIATRIC:  The patient is alert and oriented x3.  Affect is  normal.   IMPRESSION AND RECOMMENDATIONS:  1. Mild nonobstructive coronary  artery disease based on cardiac      catheterization on May 17, 2008.  Based on this, we will      recommend continued risk factor modification strategies.  He will      continue on aspirin and statin therapy.  We would aim for LDL      control around 70.  Blood pressure looks good today.  He is not      reporting any angina.  Follow up will be in the next 3 months.  2. Permanent atrial fibrillation, rate controlled.  He is not      particularly symptomatic with this.  His CHADS2 score is 2 at this      time, and will be 3 at age 63.  We talked about the risk-benefit      profile of Coumadin and he will plan to enroll in our Coumadin      Clinic once he returns from the beach over the next few weeks.  His      goal INR would be 2.0-3.0.  3. Cardiac murmur with reported history of rheumatic heart disease.      We will plan a followup echocardiogram to reassess.     Jonelle Sidle, MD  Electronically Signed    SGM/MedQ  DD: 05/21/2008  DT:  05/22/2008  Job #: [id number]   cc:   Kirstie Peri, MD

## 2010-08-19 NOTE — Assessment & Plan Note (Signed)
[facility]                          [facility] NOTE   ROURKE, MCQUITTY                        MRN:          [id number]  DATE:05/07/2008                            DOB:          05-11-1934    REFERRING PHYSICIAN:  Kirstie Peri, MD   PRIMARY CARDIOLOGIST:  Jonelle Sidle, MD (new).   REASON FOR CONSULTATION:  Mr. Dymek is a very pleasant 76 year old male,  with no documented history of coronary artery disease, who underwent  recent screening pharmacologic stress testing.  This was reviewed by Dr.  Andee Lineman, who noted a medium-sized, partially reversible mid-to-basal  inferior defect, consistent with ischemia.  Calculated ejection fraction  was 57%.  The patient is now referred for further evaluation of this  finding, and further recommendations.   Clinically, Mr. Russom denies any antecedent history of exertional angina  pectoris.  He suggests some stable, chronic exertional dyspnea which is  mild.  He denies any symptoms suggestive of congestive heart failure.  He denies any history of myocardial infarction, congestive heart  failure, syncope, or stroke.   Mr. Goehring also presents with atrial fibrillation.  He states that he was  diagnosed with this at age 6.  He has never been treated with  Coumadin.  He denies any history of rheumatic heart disease.   Mr. Tinnel presents with cardiac risk factors notable for longstanding  diabetes mellitus, hypertension, dyslipidemia, and age.  He quit smoking  in 1965.  He denies any history for premature coronary artery disease.   EKG in our office today indicates atrial fibrillation at 72 bpm with  borderline normal axis, and nonspecific ST abnormalities.   ALLERGIES:  PENICILLIN.   CURRENT MEDICATIONS:  1. Aspirin 81 daily.  2. Norvasc 5 daily.  3. Lanoxin 0.25 daily.  4. Amaryl 4 b.i.d.  5. Hydrochlorothiazide 12.5 daily.  6. Fish oil 2 g b.i.d.  7. Lisinopril 40 daily.  8. Lipitor  10 daily.  9. Januvia 100 daily.  10.Actos 30 daily.  11.Ativan 1 mg nightly.  12.Levemir 22 units q. a.m.   PAST MEDICAL HISTORY:  1. Permanent atrial fibrillation.  2. History of normal left ventricular function.  3. Valvular heart disease.      a.     Mild mitral regurgitation, July 1997.  4. IgA nephropathy, followed by Dr. Kristian Covey.  5. IDDM.  6. Hypertension.  7. Dyslipidemia.  8. Chronic lymphocytic leukemia, followed by Dr. Isabel Caprice.  9. Status post right total knee replacement.  10.Status post hernia repair.  11.Status post TURP.  12.Status post anal fissure repair.  13.Remote tobacco.   SOCIAL HISTORY:  The patient is married, has 2 children, and 4  grandchildren.  He is a retired Education administrator.  He quit smoking in 1965.   FAMILY HISTORY:  Mother has history of atrial fibrillation.  There is no  known history of coronary artery disease.   PHYSICAL EXAMINATION:  VITAL SIGNS:  Blood pressure 120/65, pulse 84,  regular, and weight 278.  GENERAL:  A 76 year old male, obese, sitting upright, in no distress.  HEENT:  Normocephalic,  atraumatic.  PERRLA.  EOMI.  NECK:  Palpable bilateral carotid pulses without bruits; unable to  assess JVD, secondary to neck girth.  LUNGS:  Clear to auscultation in all fields.  HEART:  Irregularly regular.  A grade 2-3/6 harsh systolic murmur from  the base extending to the left preaxillary line.  No diastolic blow.  No  rubs.  ABDOMEN:  Protuberant, intact bowel sounds.  Unable to palpate an  epigastric mass.  No bruits.  EXTREMITIES:  Palpable bilateral femoral pulses without bruits.  Minimally palpable dorsalis pedis pulses with 1-2+, bilateral pitting  edema.  SKIN:  No obvious rash or lesions.  MUSCULOSKELETAL:  No gross deformity.  NEURO:  No focal deficit.   IMPRESSION:  1. Abnormal pharmacologic stress test.      a.     Medium-sized inferobasal defect suggestive of ischemia; EF       57%.  2. Permanent atrial fibrillation.       a.     CHAD2:  2.  3. Insulin-dependent diabetes mellitus.  4. Hypertension.  5. Dyslipidemia.  6. Immunoglobulin A nephropathy.  7. Chronic lymphocytic leukemia.  8. Mitral regurgitation.  9. History of normal ventricular function.   PLAN:  Following review with Dr. Diona Browner, recommendation is as follows:  1. The plan is to proceed with diagnostic coronary angiography to      exclude any significant underlying coronary artery disease.  The      patient is in agreement with this plan, and risk/benefits were      discussed.  We will make arrangements to have this scheduled      sometime later this week, with Dr. Charlies Constable, who is well-known      to the family and who has previously treated their son-in-law.  2. The patient is to continue on current medication regimen.  Of note,      we will need to receive his most recent lipid profile, for further      clarification of his underlying lipid status.  3. The issue of whether or not to initiate Coumadin anticoagulation      was briefly discussed.  However, this will be deferred, pending      results of the coronary angiogram.  4. Schedule return clinic follow up with myself and Dr. Diona Browner in 1      month, for review of catheterization results and further      recommendations.      Rozell Searing, PA-C  Electronically Signed      Jonelle Sidle, MD  Electronically Signed   GS/MedQ  DD: 05/07/2008  DT: 05/08/2008  Job #: [id number]   cc:   Kirstie Peri, MD

## 2010-08-19 NOTE — Discharge Summary (Signed)
NAME:  [name] [name], [name] [name] NO.:  [id number]   MEDICAL RECORD NO.:  [id number]          PATIENT TYPE:  INP   LOCATION:  A311                          FACILITY:  [facility]   PHYSICIAN:  Vickki Hearing, M.D.DATE OF BIRTH:  1935/01/11   DATE OF ADMISSION:  08/23/2006  DATE OF DISCHARGE:  05/22/2008LH                               DISCHARGE SUMMARY   ADMITTING DIAGNOSIS:  Ruptured quadriceps tendon, left knee.   DISCHARGE DIAGNOSIS:  Ruptured quadriceps tendon, left knee.   PROCEDURE:  Open repair of the left quadriceps tendon.   DATE OF SURGERY:  Aug 24, 2006.   OPERATIVE FINDINGS:  Complete rupture of the quadriceps tendon, left  knee.   IMPLANTS.:  A #5 Tycron suture, no metal.   HOSPITAL COURSE:  The patient was admitted on the 19th, did well and was  discharged on a the 22nd.  Physical therapy was initiated after surgery.  He tolerated that well.  He ambulated 120 feet, had 0-30 degrees passive  range of motion.  He was in a brace.   His discharge medications:  1. Vicodin 5 mg one p.o. q.4 h. p.r.n.  2. Amaryl 4 mg twice daily.  3. Lanoxin 0.25 mg daily.  4. Norvasc 5 mg daily.  5. Stop aspirin.  6. Hydrochlorothiazide 12.5 mg day.  7. Lisinopril 40 mg daily.  8. Lipitor 10 mg daily.  9. Actos 30 mg daily.  10.Ativan 1 mg at bedtime.  11.Phenergan suppository one p.o. q.6 h. p.r.n. nausea.  12.Coumadin 2.5 mg p.o. daily, #21.  13.Dulcolax suppository one pr daily p.r.n. constipation, #7.   The patient's followup visit in my office on Sep 02, 2006.   He is weightbearing as tolerated.  Passive range of motion zero to 30  until otherwise stated.   Wound to be taken care of at the office.   CONDITION ON DISCHARGE:  Stable      Vickki Hearing, M.D.  Electronically Signed     SEH/MEDQ  D:  09/17/2006  T:  09/17/2006  Job:  [id number]

## 2010-08-19 NOTE — Op Note (Signed)
NAME:  [name] [name], [name] [name] NO.:  [id number]   MEDICAL RECORD NO.:  [id number]          PATIENT TYPE:  INP   LOCATION:  A311                          FACILITY:  [facility]   PHYSICIAN:  Vickki Hearing, M.D.DATE OF BIRTH:  [birth year]   DATE OF PROCEDURE:  08/24/2006  DATE OF DISCHARGE:                               OPERATIVE REPORT   HISTORY:  This 76 year old male stepped off a height, felt a pop in his  left leg, and ruptured his quadriceps tendon.   PREOP DIAGNOSIS:  Quadriceps tendon rupture.   POSTOP DIAGNOSIS:  Quadriceps tendon rupture.   PROCEDURE:  Left quadriceps tendon repair, left knee.   SURGEON:  Vickki Hearing, M.D.   ASSISTANT:  Deniece Portela __________   ANESTHETIC:  Spinal.   OPERATIVE FINDINGS:  Complete rupture of the medial lateral-lateral  retinaculum and the quadriceps tendon from the bone.  Tibial femoral  surfaces were relatively clean with some mild degenerative changes.  There were no specimens.  Blood loss was minimal.  Complications none.  The patient went to PACU in good condition.   PROCEDURE DETAILS:  [name] [name] was identified as [name] and his  left leg was marked for surgery over the knee, countersigned by the  surgeon.  History and physical update was completed.  Antibiotics were  started using vancomycin due to PENICILLIN allergy.   The patient went to the operating room, had a spinal anesthetic; and was  placed supine on the operating table.  His left leg was prepped and  draped using sterile technique.  Time-out procedure completed.   Tourniquet was elevated after exsanguination of the limb elevation to  300 mmHg   Straight incision was made and centered over the patellar tendon,  extended proximally and distally.  Subcutaneous tissue divided.  The  tendon tear was encountered.  The joint was irrigated, and debrided of  soft tissue.  There was calcification in the tendon, itself, which may  explain the rupture.   Joint surfaces were inspected.  X-rays were  correlated with findings on of the joint; and we proceeded to irrigate;  and then repair the tendon passing two #5 sutures; and then with four  strands of suture material three drills holes were made in the sutures  were passed through the patellar tendon; and then tied over the soft  tissue bridge.  The knee was then taken through a range of motion; and  there was no tension on the suture line until about 70-degrees of  flexion.  I think it would be safe to start 0-30 degrees of flexion with  no compromise to the repair.   The wound was irrigated a final time.  Retinaculum was closed with #0  running Monocryl suture.  Subcu layer was closed with #0 and 2-0.  Staples were provided to the skin; 20 mL of Marcaine was injected into  the joint.  It included epinephrine solution.   Sterile dressings were applied after staples; and the cryo cuff and  brace were reapplied.  Postop plan weight bearing as tolerated with a  walker; 0-30  degrees for the first 4 weeks, 0-90 degrees for the  following 4 weeks, then full range of motion is allowed after 6 weeks.  He will go in a hinged knee brace after his staples are removed.      Vickki Hearing, M.D.  Electronically Signed     SEH/MEDQ  D:  08/24/2006  T:  08/24/2006  Job:  [id number]

## 2010-08-19 NOTE — Cardiovascular Report (Signed)
NAME:  [name] [name], [name] [name] NO.:  [id number]   MEDICAL RECORD NO.:  [id number]          PATIENT TYPE:  OIB   LOCATION:  1961                         FACILITY:  [facility]   PHYSICIAN:  Verne Carrow, MDDATE OF BIRTH:  March 04, 1935   DATE OF PROCEDURE:  05/17/2008  DATE OF DISCHARGE:  05/17/2008                            CARDIAC CATHETERIZATION   PRIMARY CARDIOLOGIST:  Jonelle Sidle, MD   PRIMARY CARE PHYSICIAN:  Kirstie Peri, MD   PROCEDURES PERFORMED:  1. Left heart catheterization.  2. Selective coronary angiography.  3. Left ventricular angiogram.   OPERATOR:  Verne Carrow, MD   INDICATION:  This is a 76 year old Caucasian male with a history of  insulin-dependent diabetes mellitus, hypertension, hyperlipidemia,  atrial fibrillation who presented with recent complaints of mild chronic  exertional chest pain.  The patient was referred for a myocardial  perfusion study, which showed a mild reversible defect in the  inferobasal wall.  The patient was referred for an outpatient cardiac  catheterization today.   DETAILS OF PROCEDURE:  The patient was brought to the outpatient cardiac  catheterization laboratory after signing informed consent.  The right  groin was prepped and draped in the sterile fashion.  A 1% lidocaine was  used for local anesthesia.  A 4-French sheath was inserted into the  right femoral artery without difficulty.  A JL4 diagnostic catheter was  used to selectively engage the left coronary system.  A 3DRC diagnostic  catheter was used to selectively engage the right coronary artery.  A 4-  French pigtail catheter was used to cross the aortic valve into the left  ventricle.  Following performance of the left ventricular angiogram, the  catheter was pulled back across the aortic valve with no significant  pressure gradient measured.  The patient tolerated the procedure well.  The sheath was removed in the holding area.  The patient  will have 2  hours of bedrest.   ANGIOGRAPHIC FINDINGS:  1. Left main coronary artery had 20% plaque in the midportion of the      vessel.  2. The left anterior descending had a 30% stenosis in the proximal      portion, just after a large septal perforator.  There were luminal      irregularities in the midportion of the vessel.  The first and      second diagonal branches were small-caliber vessels but had no      disease.  There was no obstructive disease in the left coronary      system.  3. The circumflex artery had a 40% stenosis in the midportion of the      vessel.  There is a small-to-moderate size first obtuse marginal      branch that was free of disease.  4. There is a moderate-to-large size ramus intermedius branch and has      luminal irregularities.  5. The right coronary artery is a large dominant vessel and has      luminal irregularities in the proximal mid and distal portions.      The distal right  coronary bifurcates into a moderate size posterior      descending artery that has plaque disease and a large      posterolateral segment that has luminal irregularities.  6. Left ventricular angiogram was performed in the RAO projection and      shows normal systolic function with no wall motion abnormalities.      Ejection fraction is estimated at 55%.   HEMODYNAMIC DATA:  Central aortic pressure 109/51, LV pressure 113/8,  end-diastolic pressure 12.   IMPRESSION:  1. Mild nonobstructive coronary artery disease.  2. Normal left ventricular systolic function.   RECOMMENDATIONS:  We will continue medical management.  This patient has  mild nonobstructive coronary artery disease.  The patient will follow up  with Dr. Nona Dell in our [place] in 1-2 weeks.      Verne Carrow, MD  Electronically Signed     CM/MEDQ  D:  05/17/2008  T:  05/17/2008  Job:  [id number]   cc:   Kirstie Peri, MD  Jonelle Sidle, MD

## 2010-08-19 NOTE — H&P (Signed)
NAME:  [name] [name], [name] [name] NO.:  [id number]   MEDICAL RECORD NO.:  [id number]          PATIENT TYPE:  INP   LOCATION:  A311                          FACILITY:  [facility]   PHYSICIAN:  Vickki Hearing, M.D.DATE OF BIRTH:  1934/05/01   DATE OF ADMISSION:  08/23/2006  DATE OF DISCHARGE:  LH                              HISTORY & PHYSICAL   CHIEF COMPLAINT:  Left knee pain.   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   MEDICATIONS:  1. Lanoxin 0.25 daily.  2. Aranesp 5 mg daily.  3. Hydrochlorothiazide 12.5 mg daily.  4. Prolix 120 mg three times a day 15 minutes before meals.  5. Lisinopril 40 a day.  6. Lipitor 10 a day.  7. Flomax 0.4 mg a day.  8. Actos 30 mg at bedtime.  9. Ativan 1 mg at bedtime.  10.He takes a one-a-day aspirin.   This gentleman was walking, stepped down a 2-1/2 foot height, felt a pop  in his knee, then fell, was unable to ambulate and was brought to the  hospital.  X-rays showed a low-riding patella indicative of quadriceps  tendon rupture.  He had sustained an abrasion over the left knee, as  well.   This is a surgical condition and will require suture fixation.  Risks  and benefits were explained, postoperative course, and the patient  agrees to have surgery.   PHYSICAL EXAMINATION:  VITAL SIGNS:  Temperature 97.2, pulse 71,  respiratory rate 16, blood pressure 148/73.  CBG at 0600 was 180.  GENERAL APPEARANCE:  The patient is well-developed and nourished.  Grooming and hygiene normal.  There are no deformities.  CARDIOVASCULAR:  There is no peripheral edema.  Good pulses.  No  swelling or  varicose veins.  LYMPH EXAM:  Negative.  SKIN:  Abrasion over the left knee, very superficial.  Otherwise, skin  integrity intact.  Right total knee incision healed and normal.  NEUROLOGIC:  He is awake, alert and oriented x3.  His mood and affect is  normal.  He has normal coordination, sensation and reflexes.  MUSCULOSKELETAL:  He has a large palpable painful defect at the superior  aspect of his patella and medial retinaculum with tenderness, swelling  of the knee joint, and inactive extension.  Otherwise, limbs are normal  in terms of alignment.  No contracture, subluxation, atrophy, tremor,  weakness or deformity.   IMPRESSION:  Quadriceps tendon rupture.   PLAN:  Suture fixation quadriceps back to patella.   LABORATORY DATA:  Results of all labs are sodium 139, potassium 3.8,  chloride 104, CO2 of 29, glucose 171, BUN and creatinine 24 and 0.96.  Hemoglobin is 13.4, white count 31.7, neutrophil count 15%, lymphocyte  count 78%.  Chest x-ray portable revealed mild  cardiomegaly, COPD with  mild bronchitic changes.      Vickki Hearing, M.D.  Electronically Signed     SEH/MEDQ  D:  08/24/2006  T:  08/24/2006  Job:  [id number]   cc:   [facility]

## 2010-08-22 NOTE — Discharge Summary (Signed)
[facility]  Patient:    [name] [name], [name] [name] Visit Number: [id number] MRN: [id number]          Service Type: Attending:  R. Valma Cava, M.D. Dictated by:   Marcie Bal Trocale, P.A.-C. Adm. Date:  01/11/01   CC:         R. Valma Cava, M.D.  Dr. Clelia Croft in [facility]   Referring Physician Discharge [facility]  DATE OF BIRTH:  03-19-1935.  CHIEF COMPLAINT:  Right knee pain.  HISTORY OF PRESENT ILLNESS:  The patient is a 75 year old male who presents with complaints of progressive right knee pain.  He has had knee pain for several years now, and underwent a knee arthroscopy in 1996.  This provided him with significant relief for a few years.  Over the last year though, he has had progressively worsening pain to that right knee.  He has undergone a series of injections in that knee as well as tried various anti-inflammatory medications without improvement.  Because of the impact on his activity to daily living and worsening symptoms, it was recommended he may benefit from surgical intervention.  Of note; he was scheduled for surgery on August 27th of this year, but this surgery had to be postponed.  On his preoperative labs he was noted to have elevated lymphocyte count and overall white blood cell count.  He was sent back to his primary medical physician who saw him and diagnosed him with chronic lymphocytic leukemia.  Apparently this is in the very early stage, and he has been stable from this.  Dr. Thomasena Edis has personally spoke with Dr. Clelia Croft, the patient medical physician, on August 29th and felt the patient had no contraindication to proceeding with surgery even with his diagnosis of CLL.  He was medically clear at that point.  REVIEW OF SYSTEMS:  Denies any recent fever, chills, headaches, diplopia or blurred vision.  No earaches, sore throat, or rhinorrhea.  No chest pain, shortness of breath, or cough.  No abdominal pain, nausea, vomiting,  diarrhea, or constipation.  No melena or bright-red blood per rectum.  No urinary frequency, dysuria, or hematuria.  No numbness or tingling in his extremities.   PAST MEDICAL HISTORY:  Significant for;  1. Chronic lymphocytic leukemia.  2. Chronic atrial fibrillation diagnosed in 1967 not on anticoagulation     therapy.  3. IGA nephropathy.  4. Microscopic hematuria.  5. Type 2 diabetes mellitus.  6. Hypertension.  7. Osteoarthritis.  8. History of fatty liver.  9. Umbilical hernia. 10. Varicose veins of bilateral lower extremities, left more than right.  PAST SURGICAL HISTORY:  Arthroscopy right knee in 1996, tonsillectomy as a child, inguinal hernia repair in 1970, prostate biopsy secondary to a high PSA which revealed a benign nodule and vasectomy.  ALLERGIES:  PENICILLIN CAUSING A RASH AND AMOXICILLIN CAUSING GI DISTRESS.  MEDICATIONS:  1. Amaryl 4 mg q.d. after breakfast.  2. Lanoxin 0.25 mg q.d.  after breakfast.  3. Norvasc 5 mg q.d. after breakfast.  4. Lodine 500 mg q.d.  5. Aspirin 81 mg q.d.  6. Vitamin E 400 IE units q.d. after breakfast.  7. Glucosamine chondroitin.  8. Accupril 40 mg q.d. with supper.  9. Lipitor 10 mg q.d. with supper. 10. Ativan 1 mg p.o. q.h.s.  SOCIAL HISTORY:  The patient is married.  He is has two steps leading into his home.  He does have some equipment at home to assist when he returns.  Denies any  alcohol or tobacco intake.   His primary medical physician is Dr. ___________ Clelia Croft at [facility].  FAMILY HISTORY:  Father died at age 80 of colon cancer.  The patients mother died at age 71 with TIAs.  She also has history of colon cancer, dementia, and atrial fibrillation.  He does have one sister with atrial fibrillation as well.  PHYSICAL EXAMINATION:  VITAL SIGNS: Pulse 86, respiratory 12, blood pressure 138/78.  GENERAL:  This is a well-developed, well-nourished male in no acute distress. He is  moderately obese.  HEENT:  Head atraumatic and normocephalic.  Pupils equal, round and reactive to light.  Extraocular movements are grossly intact.  Oropharynx is clear without redness, exudates, or lesions.  NECK:  Supple with no cervical lymphadenopathy.  CHEST:  Clear to auscultation bilaterally with no wheezes or crackles.  HEART:  An occasional irregular beat, but overall with no murmur, rub, or gallop.  ABDOMEN: Obese, soft, nontender, nondistended with no masses, no hepatosplenomegaly.  BREAST and GENITOURINARY:  Not exam as not pertinent to present illness.  EXTREMITIES:  He has a valgus clunk to the right knee.  Range of motion to 5-110 degrees with crepitation and pain.  There are 2+ dorsalis pedis and posterior tibialis pulses.  Motor and sensory are grossly intact in both lower extremities.  SKIN:  Intact without rashes or lesions.  STUDIES:  X-rays demonstrate valgus deformity with marked narrowing of the lateral compartment. Large osteophytes and subchondral sclerosis.  IMPRESSION:   1. Right knee osteoarthritis.  2. Chronic lymphocytic leukemia.  3. Chronic atrial fibrillation.  4. Immunoglobulin A nephropathy.  5. Type 2 diabetes mellitus.  6. Microscopic hematuria.  7. Hypertension.  8. Osteoarthritis.  9. Fatty liver. 10. Umbilical hernia.  PLAN:  The patient will be admitted to [facility] on January 11, 2001 to undergo a right total knee arthroplasty by Dr. Thomasena Edis.  We have already received preoperative medical clearance from Dr. Clelia Croft as noted earlier in the HPI.  All questions have been encouraged and answered for the patient. Dictated by:   Marcie Bal Trocale, P.A.-C. Attending:  R. Valma Cava, M.D. DD:  01/04/01 TD:  01/04/01 Job: [id number]

## 2010-08-22 NOTE — Consult Note (Signed)
NAME:  [name] [name], [name] [name] NO.:  [id number]   MEDICAL RECORD NO.:  [id number]          PATIENT TYPE:  INP   LOCATION:  A332                          FACILITY:  [facility]   PHYSICIAN:  Vania Rea, M.D. DATE OF BIRTH:  1935-01-29   DATE OF CONSULTATION:  03/02/2005  DATE OF DISCHARGE:                                   CONSULTATION   PRIMARY CARE PHYSICIAN:  Dr. Sherryll Burger in [place].   PHYSICIAN REQUESTING THE CONSULT:  Dennie Maizes, M.D.   REASON FOR CONSULT:  Elderly patient with multiple medical problems, status  post TURP, for medical management.   ASSESSMENT:  1.  Hypertension, controlled.  2.  Chronic atrial fibrillation, rate controlled.  3.  Diabetes type 2, control unknown.  4.  Chronic lymphocytic leukemia, stable.  5.  History of IgA nephropathy, stable.   RECOMMENDATIONS:  1.  Continue current therapy for hypertension, diabetes.  Will go ahead and      get regular Accu-Cheks and get a hemoglobin A1c.  2.  Will go ahead and get an EKG to confirm his atrial fibrillation, but      will withhold anticoagulation this early post TURP.  Will have him      follow up with cardiologist as an outpatient.  Consider anticoagulation      in an elderly patient with chronic atrial fibrillation and multiple      medical problems including hypertension.   HISTORY AND PHYSICAL:  This is a 76 year old obese Caucasian gentleman with  medical problems noted above, who was admitted electively for TURP.  The  patient had successful surgery today, and the hospitalist service was  consulted for management.  Currently, the patient has no acute problems.  He  is lying comfortably in bed and is having no pains.   PAST MEDICAL HISTORY:  1.  Diabetes.  2.  Hypertension.  3.  CLL, with a baseline white count of around 20,000.  4.  Paroxysmal atrial fibrillation since age 11, now converted to chronic      atrial fibrillation.   CURRENT MEDICATIONS:  1.  Norvasc 5 mg daily.  2.   Cipro 500 mg twice daily.  3.  Digoxin 0.25 mg daily.  4.  HCTZ 12.5 mg daily.  5.  Lisinopril 40 mg daily.  6.  Ativan 1 mg at bedtime.  7.  Multivitamins 1 daily.  8.  Starlix 120 mg 3 times daily before meals.  9.  Actos 30 mg at bedtime.  10. Simvastatin 20 mg at bedtime.  Substitute for the patient's Lipitor.  11. Morphine 2 mg IV every 3 hours when necessary.  12. Tylox 1 capsule every 8 hours when necessary.  13. The patient's aspirin and Lodine are on hold.  14. The patient's Flomax is unnecessary status post TURP.   ALLERGIES:  PENICILLIN causes a rash and swelling of the throat.   SOCIAL HISTORY:  No history of tobacco, alcohol, or illicit drug use.   FAMILY HISTORY:  Significant for colon cancer in both parents, and atrial  fibrillation in his mother and sister.  REVIEW OF SYSTEMS:  A 10-point review of systems revealed no additional  information.   PHYSICAL EXAMINATION:  GENERAL:  This is a pleasant morbidly obese Caucasian  gentleman lying flat on his back in no distress, getting continuous bladder  irrigation status post TURP.  VITAL SIGNS:  Temperature 97.9; pulse 70; respirations 20; blood pressure  123/57.  HEENT:  His pupils are round, equal, and reactive.  His mucous membranes are  pink and anicteric.  He is not dehydrated.  He has no oral Candida.  His  dentition is good.  His face is moon shaped and plethoric.  He appears to  have bilateral parotid hypertrophy.  NECK:  Thick.  There is no obvious thyromegaly.  Jugular venous distention  cannot be appreciated.  CHEST:  Clear to auscultation bilaterally.  There is no dullness.  CARDIOVASCULAR:  He has an irregularly irregular rhythm, but he is not  tachycardic.  ABDOMEN:  Obese, soft, and nontender.  EXTREMITIES:  He is wearing TED hose, but he does have 1+ edema bilaterally.  His pulses are full.  NEUROLOGIC:  No obvious cranial nerve deficit is appreciated, and there is  no focal neurologic deficit.    His labs were drawn on February 27, 2005 and reveal a white count of 26.8, a  hemoglobin of 14.1, platelets 172, 16% neutrophils, 78% lymphocytes.  His  absolute lymphocyte count was 20.9.  His INR was 1.1.  His sodium was 134,  potassium 4.2, chloride 98, CO2 30, glucose 193, BUN 26, creatinine 1,  calcium 9.3.      Vania Rea, M.D.  Electronically Signed     LC/MEDQ  D:  03/02/2005  T:  03/02/2005  Job:  [id number]   cc:   Dennie Maizes, M.D.  Fax: [phone]   Kirstie Peri, MD  Fax: [phone]

## 2010-08-22 NOTE — Op Note (Signed)
NAME:  [name] [name], [name] [name]                 ACCOUNT NO.:  [id number]   MEDICAL RECORD NO.:  [id number]          PATIENT TYPE:  AMB   LOCATION:  DAY                           FACILITY:  [facility]   PHYSICIAN:  Jerolyn Shin C. Katrinka Blazing, M.D.   DATE OF BIRTH:  [birth year]   DATE OF PROCEDURE:  DATE OF DISCHARGE:                                 OPERATIVE REPORT   PREOPERATIVE DIAGNOSIS:  Chronic anal fissure, anal stenosis.   POSTOPERATIVE DIAGNOSIS:  Chronic anal fissure, anal stenosis.   PROCEDURE:  Anal fissurectomy with internal anal sphincterotomy.   SURGEON:  Dirk Dress. Katrinka Blazing, M.D.   DESCRIPTION OF PROCEDURE:  Under spinal anesthesia, the patient's perianal  area was prepped and draped in a sterile field.  Digital dilatation to two  fingerbreadths of the anal canal was carried out.  Digital dilatation to two  fingerbreadths of the anal canal was carried out.  Large operating anoscope  was placed and full view of anal canal was carried out.  The patient has a  solitary Y posterior to the anal fissure.  Using Metzenbaum scissors the  internal sphincter was separated.  The internal sphincter was separated from  the underlying musculature.  The undermining was extended about 1/2 of the  length of the sphincter which was about 1.5 cm.  The muscle and surrounding  mucosa was then excised.  The mucosa was undermined on each side and then  closed in the midline using running locking 2-0 chromic.  Local infiltration  with 1% Xylocaine was carried out.  The patient tolerated the procedure  well.  There was essentially no bleeding.  He was then transferred to a bed  and taken to the post anesthetic care unit for monitoring.       LCS/MEDQ  D:  09/02/2004  T:  09/02/2004  Job:  [id number]

## 2010-08-22 NOTE — Op Note (Signed)
NAME:  [name] [name], [name] [name]                 ACCOUNT NO.:  [id number]   MEDICAL RECORD NO.:  [id number]          PATIENT TYPE:  AMB   LOCATION:  DAY                           FACILITY:  [facility]   PHYSICIAN:  [name] [name], M.D.    DATE OF BIRTH:  [birth year]   DATE OF PROCEDURE:  01/18/2004  DATE OF DISCHARGE:                                 OPERATIVE REPORT   PROCEDURE:  Total colonoscopy.   INDICATIONS:  [name] [name] is a 76 year old Caucasian male who is undergoing high-  risk screening colonoscopy.  Both of his parents had colon carcinoma.  His  last colonoscopy was about five years ago.  He presently is free of any GI  symptoms other than occasional hematochezia when his stools are dry.  Procedure and risks were reviewed and informed consent was obtained.   PREOPERATIVE MEDICATIONS:  Demerol 25 mg IV, Versed 4 mg IV.   FINDINGS:  The procedure was performed in the endoscopy suite.  The  patient's vital signs and O2 saturation were monitored during the procedure  and remained stable.  The patient was placed in the left lateral recumbent  position.  Rectal examination was performed.  No abnormality noted on  external or digital exam.  The Olympus videoscope was placed in the rectum  and advanced under vision into the sigmoid colon and beyond.  Preparation  was satisfactory.  The scope was passed to the cecum which was identified by  the appendiceal orifice and ileocecal valve. There were three small polyps  at the ascending colon which were ablated by cold biopsy and submitted in  one container.  These were suspicious or hyperplastic polyps.  The fourth  polyp was slightly larger at mid transverse colon which was also ablated by  cold biopsy and submitted in a separate container.  The mucosa of the rest  of the container was normal.  Rectal mucosa similarly was normal.  The scope  was retroflexed and the anorectal junction was unremarkable.  Endoscope was  straightened and withdrawn.  The patient  tolerated the procedure well.   FINAL DIAGNOSIS:  Four small polyps found that were all ablated by cold  biopsy.  Three were at the ascending colon and the fourth one was at the  transverse colon.   RECOMMENDATIONS:  He will resume his aspirin and Lodine as before as well as  his usual diet and medications.  I will be contacting the patient with the  biopsy results.  Given his family history, he will need to return for repeat  exam in five years from now.     [name] [name]  NR/MEDQ  D:  01/18/2004  T:  01/18/2004  Job:  [id number]   cc:   [name] [name], M.D.  [address]  [place], [place] [address]

## 2010-08-22 NOTE — H&P (Signed)
NAME:  [name] [name], CICERO                 ACCOUNT NO.:  [id number]   MEDICAL RECORD NO.:  [id number]          PATIENT TYPE:  AMB   LOCATION:  DAY                           FACILITY:  [facility]   PHYSICIAN:  Dennie Maizes, M.D.   DATE OF BIRTH:  1935/02/10   DATE OF ADMISSION:  03/02/2005  DATE OF DISCHARGE:  LH                                HISTORY & PHYSICAL   CHIEF COMPLAINT:  Voiding difficulty, urinary frequency, nocturia, enlarged  prostate.   HISTORY OF PRESENT ILLNESS:  A 76 year old male who complains of voiding  difficulty, urinary frequency x6 to 8, and nocturia x2 for several years.  He feels he is not emptying the bladder well.  He has been under the care of  the urologist at [facility] for about 47 years.  He has been  treated with Flomax for his obstructive symptoms.  He did not have good  symptomatic relief.  He was started on Proscar along with Flomax.  Developed  rashes, and the Proscar was discontinued.   PAST MEDICAL HISTORY:  Significant for:  1.  Chronic prostatitis.  2.  Kidney stones.  3.  Prostate biopsy in 1998 which was negative.  4.  Has been evaluated for microhematuria, and abnormality has been noted.  5.  He has been diagnosed with IgM nephropathy by renal biopsy.  6.  History of chronic atrial fibrillation.  7.  Chronic lymphocytic leukemia in remission.  8.  Arthritis.  9.  Status post right knee replacement.  10. Status post inguinal hernia repair.  11. Type 2 diabetes mellitus.  12. BPH with bladder neck obstruction.  13. History of umbilical hernia.  14. Status post vasectomy.   MEDICATIONS:  1.  Lanoxin 0.25 mg p.o. daily.  2.  Aranesp 5 mg 1 p.o. daily.  3.  Hydrochlorothiazide 12.5 mg 1 p.o. daily.  4.  Prolix 120 mg 3 times a day 15 minutes before meals.  5.  Lisinopril 40 mg q p.o. daily.  6.  Lipitor 10 mg 1 p.o. daily.  7.  Flomax 0.4 mg 1 p.o. daily.  8.  Actos 30 mg at bedtime.  9.  Ativan 1 mg at bedtime.  10. The  patient also has been taking aspirin 81 mg daily and Lodine 5 mg      p.o. daily which have been stopped for the surgery.   ALLERGIES:  PENICILLIN.  PROSCAR causes some rashes.   PHYSICAL EXAMINATION:  VITAL SIGNS:  Height 6 feet, weight 175 pounds.  HEAD, EYES, EARS, NOSE, AND THROAT:  Normal.  NECK:  No masses.  LUNGS:  Clear to auscultation.  HEART:  Irregular rate and rhythm, no murmurs.  ABDOMEN:  Soft, no palpable flank mass, no CVA tenderness.  Bladder is not  palpable.  GU:  Penis and testes are normal.  RECTAL:  Large benign prostate about 60 g size.   The patient was evaluated further in the office.  Urinary flow study  revealed a peak rate of 7 mL per second. The patient voided 351 mL.  Cystoscopy was done under local  anesthesia.  Postvoid residual was 150 mL.  There was moderate hypertrophy bilaterally prostate with bladder neck  obstruction.  The bladder was normal.   IMPRESSION:  Benign prostatic hypertrophy with bladder neck obstruction,  prostatism.   PLAN:  The patient has stable medical therapy.  I discussed with him  surgical treatment of benign prostatic hypertrophy, and he was agreeable.  He is scheduled to undergo transurethral resection of the prostate under  anesthesia in short-stay center at [facility].  I have informed the  patient regarding the diagnosis, operative details, alternative treatments,  outcome, possible risks and complications, and he has agreed for the surgery  to be done.  His prostate is too large to be treated with minimal invasive  therapy.      Dennie Maizes, M.D.  Electronically Signed     SK/MEDQ  D:  03/01/2005  T:  03/01/2005  Job:  [id number]

## 2010-08-22 NOTE — H&P (Signed)
[name] [name]. [facility]  Patient:    [name] [name], [name] [name] Visit Number: [id number] MRN: [id number]          Service Type: Attending:  R. Valma Cava, M.D. Dictated by:   Della Goo, P.A.-C. Adm. Date:  11/30/00                           History and Physical  CHIEF COMPLAINT:  Right knee pain.  HISTORY OF PRESENT ILLNESS:  [name] [name] is a  very pleasant 76 year old white male with a longstanding history of right knee pain.  He had a right knee arthroscopy in 1996, which gave him significant relief for a couple of years; however, over the past year or so he has had progressive pain and dysfunction of the right knee.  He has failed conservative treatment, including anti-inflammatory medications, as well as analgesics.  He has also had injections to the knee with corticosteroids.  He has continued to have pain limiting his activity.  X-rays reveal end-stage osteoarthritis of the right knee.  Due to his continued symptoms of pain and dysfunction, as well as findings on x-ray, it was felt that he would require surgical intervention. He is now being admitted to undergo a right total knee replacement.  CURRENT MEDICATIONS:  1. Amaryl 4 mg q.d. after breakfast.  2. Lanoxin 0.25 mg q.d. after breakfast.  3. Norvasc 5 mg q.d. after breakfast.  4. Lodine 500 mg q.d., stopped on November 20, 2000.  5. Aspirin 81 mg q.d., stopped on November 20, 2000.  6. Vitamin E 400 IU q.d. after breakfast.  7. Glucosamine/chondroitin q.d.  8. Accupril 40 mg q.d. with supper.  9. Lipitor 10 mg q.d. with supper. 10. Ativan 1 mg p.o. q.h.s.  ALLERGIES:  The patient reports that PENICILLIN causes him a rash, and AMOXICILLIN causes GI distress.  PAST MEDICAL HISTORY: 1. Chronic atrial fibrillation without anticoagulation therapy. 2. IgA neuropathy. 3. Microscopic hematuria. 4. Type 2 diabetes mellitus. 5. Hypertension. 6. Osteoarthritis. 7. History of fatty liver. 8. Umbilical  hernia. 9. Varicose veins, bilateral lower extremities, left greater than right.  PAST SURGICAL HISTORY: 1. Significant for an arthroscopy of the right knee in 1996. 2. Tonsillectomy as a child. 3. Inguinal hernia repair in the 1970s. 4. Prostate biopsy, secondary to a high PSA, which revealed a benign    nodule.  FAMILY MEDICAL DOCTOR:  Kirstie Peri, M.D. of [place], [facility].  SOCIAL HISTORY:  The patient lives with his wife.  He has two steps into the usual entrance of his home.  He has some equipment available to him, including a walker and an elevated toilet seat which he will use at home.  He has no intake of tobacco and no intake of alcohol.  FAMILY HISTORY:  The patients father died at age 44 of colon cancer.  The patients mother died at age 77 with TIAs.  She also had a history of colon cancer, dementia, and atrial fibrillation.  The patient has one sister with atrial fibrillation as well.  REVIEW OF SYSTEMS:  CNS:  The patient denies blurred vision, double vision, seizure disorder, headaches, or paralysis.  CARDIORESPIRATORY:  No chest pain, shortness of breath, cough, sputum production, or hemoptysis.  GU/GI:  No nausea, vomiting, diarrhea, or constipation.  He has no dysuria.  He does have a history of microscopic hematuria.  He also has erectile dysfunction and has a pump.  He has no  melena or bloody stools.  MUSCULOSKELETAL:  As per the history of present illness.  HEMATOLOGIC:  The patient denies jaundice, hepatitis, and blood clots or bleeding tendencies.  He has not had a blood transfusion in the past.  PHYSICAL EXAMINATION:  VITAL SIGNS:  The patient is afebrile, pulse 72 and irregular, blood pressure 152/62.  GENERAL:  He is a well-developed, well-nourished white male, alert and oriented x 4, in no acute distress.  He is pleasant, and cooperative.  HEENT:  Normocephalic, atraumatic.  Pupils equal, round, reactive to light  and accommodation.  Extraocular movements intact.  Nose without drainage. Oropharynx without edema or erythema.  NECK:  Supple, no adenopathy or thyromegaly.  No carotid bruits heard.  LUNGS:  Clear to auscultation.  HEART:  An irregular rate and rhythm, no murmur appreciated.  ABDOMEN:  Soft, nontender.  Bowel sounds present.  GENITOURINARY/RECTAL/BREASTS:  Examinations not performed, not pertinent to the present illness.  EXTREMITIES:  The patient has varicosities of bilateral lower extremities, with the left being greater than the right.  Pulses +2 at the dorsalis pedis bilaterally.  Sensation intact in both lower extremities.  Examination of the right knee reveals range of motion 5-110 degrees.  He has +1 crepitus.  There is on palpable effusion.  He has joint line tenderness throughout.  A valgus deformity of the right knee.  He does ambulate with a limp.  IMPRESSION: 1. End-stage osteoarthritis of the right knee. 2. Chronic atrial fibrillation. 3. IgA neuropathy. 4. Type 2 diabetes mellitus. 5. Microscopic hematuria. 6. Hypertension. 7. Osteoarthritis. 8. Fatty liver. 9. Umbilical hernia.  PLAN:  The patient is being admitted to the hospital to undergo a right total knee arthroplasty.  He has been seen by his family physician, Dr. Sherryll Burger, and has been cleared for surgical intervention.  He will be on Coumadin postoperatively for deep vein thrombosis and PE prophylaxis.  He will plan to return to him home postoperatively with the use of [facility] for home health physical therapy and other needs. Dictated by:   Della Goo, P.A.-C. Attending:  R. Valma Cava, M.D. DD:  11/22/00 TD:  11/22/00 Job: [id number]

## 2010-08-22 NOTE — Op Note (Signed)
[place]. [facility]  Patient:    [name] [name], [name] [name] Visit Number: [id number] MRN: [id number]          Service Type: SUR Location: 5000 [id number] [id number] Attending Physician:  Erasmo Leventhal Dictated by:   R. Valma Cava, M.D. Proc. Date: 01/11/01 Admit Date:  01/11/2001                             Operative Report  PREOPERATIVE DIAGNOSIS:  Right knee end-stage osteoarthritis with valgus malalignment.  POSTOPERATIVE DIAGNOSIS:  Right knee end-stage osteoarthritis with valgus malalignment.  PROCEDURE:  Right total knee arthroplasty.  SURGEON: R. Valma Cava, M.D.  ASSISTANT:  Irena Cords, P.A.-C.  ANESTHESIA:  General followed by epidural.  ESTIMATED BLOOD LOSS:  Less than 50 cc.  DRAINS:  Two medium Hemovac.  COMPLICATIONS:  None.  TOURNIQUET TIME:  Two hours at 375 mmHg.  OPERATIVE IMPLANTS:  Osteonics components, all cemented, size 13 femur, size 13 tibia, 10 mm polyethylene insert, 28 mm polyethylene patella. Posterior-stabilized.  DESCRIPTION OF PROCEDURE:  The patient was counseled in the holding area, the correct side was identified, an IV started, and antibiotics were given.  The patient had a history of an allergy to penicillin in the past with a rash, and it was decided to give him vancomycin without me being asked.  He was then counseled, the chart was signed, and taken to the OR.  There he was placed under general anesthesia.  The right knee was examined, a 5 knee flexion contracture with good flexion to 120 degrees.  Valgus malalignment.  A Foley catheter was placed utilizing sterile technique by the OR circulating nurse. The right lower extremity was elevated.  It was prepped with Duraprep and all was draped into a sterile fashion.  Exsanguinated with an Esmarch, and the tourniquet was inflated to 375 mmHg.  Midline incision was made through the skin and subcutaneous tissue.  Small veins coagulated.  Medial and lateral  soft tissue flaps were developed at the appropriate level.  He had a thickened prepatellar bursa, and it was excised.  A medial parapatellar arthrotomy was performed, and a very slight medial release was done to the proximal tibia.  The knee was then flexed.  He had end-stage osteoarthritic changes.  Bone-on-bone contact.  The cruciate ligaments were resected.  A starter hole made in the distal femur and the canal  was irrigated until the effluent was clear.  The intramedullary rod was gently placed and I took a 10 mm cut off the distal femur.  The distal femur was found to be a size #13.  Rotation marks were made.  It was cut to fit a size #13.  Medial and lateral menisci were removed, geniculate vessels were coagulated. Osteophytes removed from the proximal tibia, as was the tibial eminence. Proximal tibia was found to be size #13.  Starter hole was made.  Step reamer was utilized.  The canal was again irrigated until the effluent was clear. Intramedullary rod was gently placed, and then I took a 10 mm cut based upon the medial side.  Posterolateral and posteromedial femoral osteophytes were removed under direct visualization.  The femoral trochlea was prepared in standard fashion.  At this time with the trial implants of a 13 femur and a 13 tibia with a 10 mm insert, we were a little bit tight in flexion and extension.  The trials were removed.  We took another 2  mm off the proximal tibia.  At this point in time we had excellent trials with a 13 on both sides, had excellent range of motion and soft tissue balance with the 10 mm insert. Rotation marks were made in the proximal tibia, and the delta keel was then performed in standard fashion.  The patella was found to be a size #28.  It was reamed to a depth of 10 mm, locking holes were made, and excess bone was removed.  At this point using Modern cement technique, all components were cemented into place, a size 13 tibia, size 13  femur, with a 28 patella.  After the cement had cured, we went through trials with trial of a 10 mm tibial insert.  We had excellent flexion-extension gaps, range of motion, and soft tissue balance. This was then removed, excess cement was removed, and a final 10 mm tibial insert was placed.  Patellar tracking was a little bit tight laterally.  A lateral release was performed.  In addition, to help with the soft tissue balance of the knee, a popliteus recession was performed.  At this time patellofemoral tracking was anatomic, the knee was well-balanced.  Bone wax was placed on the exposed bony surfaces, wounds were irrigated with _____ antibiotic solution during the closure.  Two medium Hemovac drains were placed.  A sequential closure of layers were done, the synovium with Vicryl, arthrotomy with Vicryl, subcu with Vicryl, skin closed with subcuticular Monocryl suture. Benzoin and Steri-Strips were applied, a sterile dressing was applied to the knee, Ace wrap.  The tourniquet was deflated, normal pulses in the foot and ankle at the end of the case.  Ice pack, knee immobilizer.  The patient was then turned lateral, where the epidural catheter was placed.  He was then awakened and extubated, taken from the operating room to PACU in stable condition.  Sponge and needle count were correct.  No complications. Dictated by:   R. Valma Cava, M.D. Attending Physician:  Erasmo Leventhal DD:  01/11/01 TD:  01/11/01 Job: [id number]

## 2010-08-22 NOTE — Op Note (Signed)
NAME:  [name] [name], [name] [name]                 ACCOUNT NO.:  [id number]   MEDICAL RECORD NO.:  [id number]          PATIENT TYPE:  AMB   LOCATION:  DAY                           FACILITY:  [facility]   PHYSICIAN:  Dennie Maizes, M.D.   DATE OF BIRTH:  14-Sep-1934   DATE OF PROCEDURE:  03/02/2005  DATE OF DISCHARGE:                                 OPERATIVE REPORT   PREOPERATIVE DIAGNOSIS:  Benign prostate hypertrophy and bladder neck  obstruction, prostatism.   POSTOPERATIVE DIAGNOSIS:  Benign prostate hypertrophy and bladder neck  obstruction, prostatism.   OPERATIVE PROCEDURE:  Transurethral resection of the prostate.   ANESTHESIA:  Spinal.   SURGEON:  Dr. Rito Ehrlich.   COMPLICATIONS:  None.   ESTIMATED BLOOD LOSS:  200 mL.   DRAINS:  A 22-French triple lumen Foley catheter with 30 cc balloon in the  bladder.   SPECIMEN:  Prostate chips which were sent to the pathology lab for  histopathological examination   COMPLICATIONS:  None.   INDICATIONS FOR PROCEDURE:  This 76 year old male had a enlarged prostate  with significant bladder outlet obstructive symptoms. He did not respond  well to medical therapy. He was taken to the OR today for transurethral  resection of the prostate.   DESCRIPTION OF PROCEDURE:  Spinal anesthesia was induced, and the patient  was placed on the OR table in the dorsolithotomy position. The lower abdomen  and genitalia were prepped and draped in a sterile fashion. Cystoscopy was  done with a 25-French scope. The urethra was normal. There was hypertrophy  of both lateral lobes of prostate with bladder neck obstruction. There was  also small median lobe enlargement. The appearance of the bladder was  normal. The cystoscope was then removed.   Ureter was dilated to up 32-French with Sissy Hoff sounds. A 28-French  Iglesias resectoscope with continuous bladder irrigation was then inserted  into the bladder. The small median lobe was resected first. The right and  left lateral lobes were then resected up to the level of capsule. Finally,  dissection was done in anterior midline area. The prostatic fossa was then  closely examined, and complete hemostasis was obtained by cauterization. The  prostate chips were then removed and sent for histopathological examination.  The estimated blood loss blood loss was about  200 mL. A 22-French triple lumen Foley catheter with 30 cc balloon was  inserted into the bladder. Continuous bladder irrigation was started, and  the returns were clear. The patient was transferred to the PACU in  satisfactory condition.      Dennie Maizes, M.D.  Electronically Signed     SK/MEDQ  D:  03/02/2005  T:  03/02/2005  Job:  [id number]   cc:   Sherryll Burger, M.D.  [employer]

## 2010-08-22 NOTE — Discharge Summary (Signed)
[place]. [facility]  Patient:    [name] [name], [name] [name] Visit Number: [id number] MRN: [id number]          Service Type: SUR Location: 5000 [id number] [id number] Attending Physician:  Erasmo Leventhal Dictated by:   Dorie Rank, P.A. Admit Date:  01/11/2001 Discharge Date: 01/16/2001                             Discharge Summary  ADMISSION DIAGNOSES:  1. Osteoarthritis to the right knee.  2. Chronic lymphocytic leukemia.  3. Chronic atrial fibrillation.  4. Immunoglobulin A nephropathy.  5. Type 2 diabetes mellitus.  6. Microscopic hematuria.  7. Hypertension.  8. Osteoarthritis.  9. Spotty liver. 10. Umbilical hernia.  DISCHARGE DIAGNOSES: 1. Right knee osteoarthritis, status post right total knee arthroplasty. 2. Hypokalemia, resolved. 3. Postoperative ______ anemia, stable.  PROCEDURES: 1. On January 11, 2001, the patient was taken to the operating room and    underwent a right total knee arthroplasty.  Surgeon Dr. Benny Lennert,    assistant Irena Cords, P.A.-C.  Anesthesia was general, followed by    epidural.  Complications were none. 2. Placement of epidural catheter for postoperative analgesia.    Anesthesiologist was Dr. Burna Forts.  HISTORY OF PRESENT ILLNESS:  [name] [name] is a pleasant 76 year old male who presented to the office with complaints of progressive right knee pain.  He had had knee pain for several years and underwent a knee arthroscopy in 1996. Over the last year, he had had a significant increase in the pain to his right knee.  He had undergone a series of injections to that knee, as well as had tried various anti-inflammatory medications without improvement.  The pain was to the point where it was interfering with his activities of daily living. Radiographs in the office revealed severe osteoarthritis to the right knee. It was thought he would benefit from undergoing a right total knee arthroplasty.  The risks and  benefits of the procedure were discussed with the patient and Dr. Thomasena Edis spoke with the patients physician, Dr. Sherryll Burger, and he felt there were no contraindications for proceeding with this surgery.  CONSULTS:  None.  HOSPITAL COURSE:  The patient had the above stated surgery on January 11, 2001. He tolerated the surgery well.  While in the operating room, a Hemovac drain was placed into the wound.  This was discontinued on postoperative day #2 without difficulty.  The incision was dressed in a sterile fashion.  On postoperative day #1, it was clean, dry, and intact.  The dressing was changed on postoperative day #2 and daily thereafter found to be free of any erythema or drainage.  While in the operating room, a Foley catheter was placed.  This was discontinued on postoperative day #2.  The patient was voiding well on his own throughout the remainder of his hospital stay.  Initially for pain control, an epidural was placed while in the operating room.  This controlled patients pain postoperatively.  This was discontinued on postoperative day #2. Following that, the patient utilized p.o. pain medications for pain control and this was adequate.  Hemoglobin and hematocrit were checked daily x 3 days and found to be stable. A BMET on postoperative day #1 did reveal some hypokalemia.  He was supplemented with potassium and this did resolve prior to discharge. Discharge potassium was 4.3 on January 16, 2001.  The patient was placed on Coumadin for DVT  prophylaxis.  This was monitored and dosed by the pharmacy.  This was started on postoperative day #1.  An IV was placed while in the operating room and this was [facility] on postoperative day #2, when he was noted to be taking p.o.s well.  An ADA diet was resumed postoperatively.  He tolerated p.o. intake well.  IV Ancef was used for infection control for 48 hours.  His home medications were resumed, including his glycemic control agents.  He was  also placed on a sliding scale of insulin as a precaution.  He utilized a CPN machine while in the hospital for eight to ten hours a day, starting on January 12, 2001.  He utilized a knee immobilizer to his right knee when walking and when he was in bed.  He was weight-bearing as tolerated to the right lower extremity. Physical therapy and occupational therapy worked with the patient for ambulation, as well as ADLs.  On January 16, 2001, he was felt to be medically and orthopedically stable for discharge.  LABORATORY VALUES:  H&H on January 06, 2001, were 15.5 and 45.3 respectively. H&H on January 13, 2001, 13.6 and 38.8 respectively.  On January 14, 2001, hemoglobin was 14.2, hematocrit 41.1.  Coagulation studies on January 06, 2001, prior to Coumadin therapy, PT and INR were 14.0 and 1.1 respectively.  On January 15, 2001, PT 19.9 and INR 2.0.  On January 16, 2001, PT was 22.3 and INR 2.4.  Preoperative BMET on January 06, 2001, was within normal limits other than glucose of 146.  Patient had hypokalemia on January 13, 2001, of 3.4.  On January 14, 2001, potassium was 3.4.  On January 16, 2001, potassium 4.3.  Urinalysis on January 06, 2001, was within normal limits, other than a glucose of 250.  Liver function tests on January 06, 2001, within normal limits.  Blood type on January 06, 2001, was A positive.  RADIOLOGY:  Two-view chest x-ray on November 25, 2000, revealed no active disease.  EKG:  No tracings available on the chart at the time of this dictation.  CONDITION ON DISCHARGE:  Stable.  DISCHARGE PLAN:  The patient was discharged home.  He was to have home health physical therapy and occupational therapy with [place].  Also, Coumadin maintenance through [facility].  There were to check a potassium on their first home blood draw and call the results to Dr. Sherryll Burger.  The patient was to call Dr. Fara Boros office on Wednesday to check recommendations from Dr. Sherryll Burger for his  potassium.   ACTIVITY:  He was to continue his weight-bearing as tolerated activity status to the right lower extremity.  DIET:  ADA diet.  DISCHARGE MEDICATIONS: 1. Resume home medications. 2. Prescriptions for Percocet, Robaxin, and Coumadin per pharmacy. 3. Tylenol p.r.n. fever of greater than 101 and call the office.  FOLLOWUP:  He is to follow up with Dr. Thomasena Edis in two weeks from the date of surgery.  WOUND CARE:  Daily dressing changes. Dictated by:   Dorie Rank, P.A. Attending Physician:  Erasmo Leventhal DD:  02/23/01 TD:  02/23/01 Job: [id number]

## 2010-08-22 NOTE — Discharge Summary (Signed)
[name], [name]                 ACCOUNT NO.:  [id number]   MEDICAL RECORD NO.:  [id number]          PATIENT TYPE:  INP   LOCATION:  A332                          FACILITY:  [facility]   PHYSICIAN:  [name] [name], M.D.   DATE OF BIRTH:  02-06-1935   DATE OF ADMISSION:  03/02/2005  DATE OF DISCHARGE:  11/29/2006LH                                 DISCHARGE SUMMARY   FINAL DIAGNOSES:  1.  Benign prostate hypertrophy, bladder neck obstruction.  2.  Prostatism.  3.  Diabetes mellitus.  4.  Hypertension.  5.  Chronic lymphocytic leukemia.  6.  Paroxysmal atrial fibrillation.   OPERATIVE PROCEDURE:  Transurethral resection of the prostate on March 02, 2005.   COMPLICATIONS:  None.   DISCHARGE SUMMARY:  This 76 year old male had voiding difficulty, urinary  frequency x6-8 and nocturia x2 for several years.  He feels he is not  emptying the bladder well.  He has been under the care of urologist at  [facility] for 47 years.  Was treated with Flomax for his  obstructive symptoms.  He did not have good symptomatic improvement.  He was  started on Proscar along with Flomax.  He developed rash and the Proscar was  discontinued.   The patient was evaluated in the office.  He was noted to have BPH with  bladder neck obstruction.  He was part of the [facility] for  transurethral resection of the prostate.   PAST MEDICAL HISTORY:  Chronic prostatitis, kidney stones, prostate biopsy  in 1998 which was negative.  He has been evaluated for microhematuria and no  abnormality has been noted.  He has IgM nephropathy by renal biopsy, history  of chronic atrial fibrillation, chronic lymphocytic leukemia and remission  arthritis status post right knee replacement, status post inguinal hernia  repair, type 2 diabetes mellitus, BPH with bladder neck obstruction, history  of umbilical hernia, status post vasectomy.   MEDICATIONS:  1.  Lanoxin 0.25 mg one p.o. daily.  2.   Aranesp 5 mg one p.o. daily.  3.  Hydrochlorothiazide 12.5 mg one p.o. daily.  4.  Prolix 120 mg t.i.d. and 15 minutes before meals.  5.  Lisinopril 40 mg one p.o. daily.  6.  Lipitor one p.o. daily.  7.  Flomax 0.1 mg p.o. daily.  8.  Actos 30 mg q.h.s.  9.  Ativan 1 mg q.h.s.  10. Aspirin and Lodine were discontinued for the surgery.   ALLERGIES:  PENICILLIN AND PROSCAR WHICH CAUSES RASHES.   PHYSICAL EXAMINATION:  HEENT:  Normal.  NECK:  No masses.  LUNGS:  Clear to auscultation.  HEART:  Irregular rate and rhythm.  No murmurs.  ABDOMEN:  Soft, nonpalpable flank mass or CVA tenderness.  Bladder is not  palpable.  Penis and testes are normal.  RECTAL:  Large benign prostate about 60 g in size.   HOSPITAL COURSE:  The patient was taken to the [facility] on March 02, 2005.  Under spinal anesthesia, transurethral resection of the prostate  was done.  There were no intraoperative  problems.  The patient did well in  the postoperative.  The bladder irrigation was discontinued after 24 hours.  The patient was seen by the hospitalist for follow up of his medical  problems.  The patient's postoperative labs are within normal range.  Hemoglobin 13.3, hematocrit 38.5, BUN 11, creatinine 1.0.  WBC 22.2.  By  postop day #2, the Foley catheter was removed, and the patient was voiding  well after this.  He had mild hematuria.  He was discharged and sent home on  March 04, 2005.  He was given Cipro 500 mg p.o. b.i.d. and Percocet 5/325  mg one p.o. q.8 h p.r.n. pain.  Pathology of the prostate revealed benign  prostate hyperplasia.  There was no evidence of malignancy.  Condition of  the patient at the time of discharge was stable.  He was advised to call me  for any fever, chills, voiding difficulty or gross hematuria.  He will be  reviewed in the office in two weeks.      [name] [name], M.D.  Electronically Signed     SK/MEDQ  D:  03/18/2005  T:  03/18/2005  Job:   [id number]

## 2010-08-22 NOTE — Procedures (Signed)
. [facility]  Patient:    [name], [name] Visit Number: [id number] MRN: [id number]          Service Type: SUR Location: [id number] Attending Physician:  Erasmo Leventhal Dictated by:   Burna Forts, M.D. Proc. Date: 01/11/01 Admit Date:  01/11/2001                             Procedure Report  PREOPERATIVE DIAGNOSIS:  Degenerative joint disease of the knee.  OPERATIVE PROCEDURE:  Total knee replacement, performed by R. Valma Cava, M.D.  ANESTHESIA PROCEDURE:  Placement of epidural catheter for postoperative analgesia.  DESCRIPTION OF PROCEDURE:  Preoperatively, risks and benefits of placement of the epidural catheter for postoperative analgesia were discussed with the patient, including alternatives for pain control.  This also additionally had been requested by his attending surgeon, Dr. Thomasena Edis.  The patient consented to placement of the epidural catheter for postoperative analgesia.  At the end of the operative procedure, the patient was turned to the right lateral decubitus position and a sterile prep of the lumbar area was conducted using a #17 gauge Tuohy needle adjacent to the L3-4 interspace.  The epidural space was contacted with loss of resistance technique and a catheter threaded approximately 3-4 cm beyond the needle tip, and the needle was removed.  After negative aspiration of both heme and CSF, the catheter was incrementally injected with a total of 8 cc of 0.25% Marcaine containing epinephrine plus 100 mcg of fentanyl.  This was secured in place with tape, patient turned supine and transferred to the PACU.  The patient will be followed daily the department of anesthesiology for his postoperative analgesia via the epidural catheter. Dictated by:   Burna Forts, M.D. Attending Physician:  Erasmo Leventhal DD:  01/11/01 TD:  01/11/01 Job: [id number]

## 2010-08-22 NOTE — H&P (Signed)
NAME:  [name] [name], [name] [name]                 ACCOUNT NO.:  [id number]   MEDICAL RECORD NO.:  [id number]          PATIENT TYPE:  AMB   LOCATION:  DAY                           FACILITY:  [facility]   PHYSICIAN:  Jerolyn Shin C. Katrinka Blazing, M.D.   DATE OF BIRTH:  January 09, 1935   DATE OF ADMISSION:  DATE OF DISCHARGE:  [facility]                                HISTORY & PHYSICAL   A 76 year old male with history of alternating constipation and diarrhea  with pain with each bowel movement.  He has had fresh bleeding occasionally.  He has burning and severe pain occasionally.  He underwent colonoscopy in  October 2005 by Dr. Karilyn Cota.  Colonoscopy showed no hemorrhoids.  No  treatment was given.  He has had five colonoscopies in five years. This last  episode of bleeding was about 14 May.  He was seen in the office and was  found to have a tight anal stenosis with fissure.  The patient was given a  trial of Anusol HC cream, but this has not been effective.  He continues  severe pain.  He is now scheduled for examination under anesthesia with  internal anal sphincterotomy.   PAST MEDICAL HISTORY:  1.  Hypertension.  2.  Chronic lymphocytic leukemia.  3.  Diabetes mellitus.  4.  Osteoarthritis.  5.  IgA nephropathy.   PAST SURGICAL HISTORY:  1.  Right total knee arthroplasty.  2.  Right inguinal hernia repair.   ALLERGIES:  PENICILLIN.   SOCIAL HISTORY:  He is married, retired Financial risk analyst.  No history of  drug, alcohol, or tobacco abuse.   PHYSICAL EXAMINATION:  VITAL SIGNS:  Blood pressure 124/66, pulse 82,  respirations 18, weight 276 pounds.  HEENT:  Unremarkable.  NECK:  Supple.  No JVD, bruit, adenopathy, or thyromegaly.  CHEST:  Clear to auscultation.  HEART:  Regular rate and rhythm without murmur, gallop, or rub.  ABDOMEN: Soft, nontender.  No masses.  RECTAL:  Tight anal stenosis with fissure on the right lateral and posterior  anal canal.  No hemorrhoids noted.  EXTREMITIES:  Bilateral  varicosities.  NEUROLOGIC:  No focal motor, sensory, or cerebellar deficits.   IMPRESSION:  1.  Chronic anal fissure with anal stenosis.  2.  Diabetes mellitus.  3.  Hypertension.  4.  Chronic lymphocytic leukemia.  5.  Varicose vein disease.  6.  IgA nephropathy.   PLAN:  Examination under anesthesia with internal anal sphincterotomy.       LCS/MEDQ  D:  09/01/2004  T:  09/01/2004  Job:  [id number]   cc:   Short Stay Center, [facility]

## 2010-09-05 ENCOUNTER — Ambulatory Visit (INDEPENDENT_AMBULATORY_CARE_PROVIDER_SITE_OTHER): Payer: Medicare Other | Admitting: *Deleted

## 2010-09-05 DIAGNOSIS — I4891 Unspecified atrial fibrillation: Secondary | ICD-10-CM

## 2010-09-05 DIAGNOSIS — Z7901 Long term (current) use of anticoagulants: Secondary | ICD-10-CM

## 2010-09-05 MED ORDER — WARFARIN SODIUM 5 MG PO TABS: ORAL_TABLET | ORAL | Status: DC

## 2010-09-16 ENCOUNTER — Encounter: Payer: Self-pay | Admitting: Cardiology

## 2010-10-03 ENCOUNTER — Ambulatory Visit (INDEPENDENT_AMBULATORY_CARE_PROVIDER_SITE_OTHER): Payer: Medicare Other | Admitting: *Deleted

## 2010-10-03 DIAGNOSIS — I4891 Unspecified atrial fibrillation: Secondary | ICD-10-CM

## 2010-10-03 DIAGNOSIS — Z7901 Long term (current) use of anticoagulants: Secondary | ICD-10-CM

## 2010-10-03 LAB — POCT INR: INR: 2.4

## 2010-10-10 ENCOUNTER — Ambulatory Visit (INDEPENDENT_AMBULATORY_CARE_PROVIDER_SITE_OTHER): Payer: Medicare Other | Admitting: Cardiology

## 2010-10-10 ENCOUNTER — Encounter: Payer: Self-pay | Admitting: Cardiology

## 2010-10-10 VITALS — BP 116/65 | HR 84 | Ht 72.0 in | Wt 270.1 lb

## 2010-10-10 DIAGNOSIS — E782 Mixed hyperlipidemia: Secondary | ICD-10-CM

## 2010-10-10 DIAGNOSIS — E119 Type 2 diabetes mellitus without complications: Secondary | ICD-10-CM

## 2010-10-10 DIAGNOSIS — I2510 Atherosclerotic heart disease of native coronary artery without angina pectoris: Secondary | ICD-10-CM

## 2010-10-10 DIAGNOSIS — I4891 Unspecified atrial fibrillation: Secondary | ICD-10-CM

## 2010-10-10 NOTE — Patient Instructions (Signed)
Your physician you to follow up in 1 year. You will receive a reminder letter in the mail one-two months in advance. If you don't receive a letter, please call our office to schedule the follow-up appointment. Your physician recommends that you continue on your current medications as directed. Please refer to the Current Medication list given to you today. 

## 2010-10-10 NOTE — Assessment & Plan Note (Signed)
Symptomatically stable without palpitations. Heart rate seems well controlled. Reportedly tolerating Coumadin without significant bleeding problems. Continue followup in the Coumadin clinic.

## 2010-10-10 NOTE — Assessment & Plan Note (Signed)
Continue follow up Dr. Sherryll Burger. Would aim for LDL control around 70.

## 2010-10-10 NOTE — Assessment & Plan Note (Signed)
Continue followup with Dr. Shah. 

## 2010-10-10 NOTE — Assessment & Plan Note (Signed)
No active angina, history of nonobstructive disease. 

## 2010-10-10 NOTE — Progress Notes (Signed)
Clinical Summary [name] [name] is a 76 y.o.male presents to the office for followup. He was last seen in February 2010.  History as noted below. He reports no angina or progressive shortness of breath. Not exercising regularly. Reports no problems with palpitations. Continues on Coumadin with follow up in the Coumadin clinic.  He continues to follow [name] [name] for primary care. Lipids are followed at [facility] internal medicine.  Today we discussed diet and exercise. Cardiac testing from 2010 is noted below.   Allergies  Allergen Reactions  . Penicillins     Current outpatient prescriptions:amLODipine (NORVASC) 5 MG tablet, Take 5 mg by mouth daily.  , Disp: , Rfl: ;  Ascorbic Acid (VITAMIN C) 1000 MG tablet, Take 1,000 mg by mouth daily.  , Disp: , Rfl: ;  aspirin 81 MG tablet, Take 81 mg by mouth daily.  , Disp: , Rfl: ;  atorvastatin (LIPITOR) 10 MG tablet, Take 10 mg by mouth daily. Dosage and instructions not specified  , Disp: , Rfl:  Calcium Carbonate-Vitamin D (CALCIUM + D) 600-200 MG-UNIT TABS, Take by mouth 2 (two) times daily.  , Disp: , Rfl: ;  Cholecalciferol (VITAMIN D3) 1000 UNITS CAPS, Take by mouth. Take one table daily , Disp: , Rfl: ;  digoxin (LANOXIN) 0.25 MG tablet, Take 250 mcg by mouth daily. Dosage and instructions not specified , Disp: , Rfl:  glimepiride (AMARYL) 1 MG tablet, Take 1 mg by mouth 2 (two) times daily. Dosage and instructions not specified., Disp: , Rfl: ;  hydrochlorothiazide 25 MG tablet, Take 25 mg by mouth daily.  , Disp: , Rfl: ;  insulin aspart (NOVOLOG) 100 UNIT/ML injection, Inject into the skin 2 (two) times daily.  , Disp: , Rfl: ;  insulin detemir (LEVEMIR FLEXPEN) 100 UNIT/ML injection, Inject 40 Units into the skin daily with breakfast.  , Disp: , Rfl:  Liraglutide (VICTOZA) 18 MG/3ML SOLN, Inject into the skin. One injection every morning , Disp: , Rfl: ;  lisinopril (PRINIVIL,ZESTRIL) 40 MG tablet, Take 40 mg by mouth daily.  , Disp: , Rfl: ;  LORazepam  (ATIVAN) 1 MG tablet, Take 1 mg by mouth at bedtime. UAD. Dosage and instructions not specified, Disp: , Rfl: ;  Multiple Vitamin (MULTIVITAMIN) capsule, Take 1 capsule by mouth daily.  , Disp: , Rfl:  Omega-3 Fatty Acids (FISH OIL) 1000 MG CAPS, Take by mouth. Take 2 tabs twice a day , Disp: , Rfl: ;  pioglitazone (ACTOS) 30 MG tablet, Take 30 mg by mouth daily.  , Disp: , Rfl: ;  vitamin E 400 UNIT capsule, Take 400 Units by mouth daily.  , Disp: , Rfl: ;  warfarin (COUMADIN) 5 MG tablet, Taking 7.5mg  daily except 5mg  on M,W,F, Disp: 90 tablet, Rfl: 3 DISCONTD: amLODipine (NORVASC) 10 MG tablet, Take 10 mg by mouth daily. Dosage and instructions not specified. , Disp: , Rfl: ;  DISCONTD: etodolac (LODINE) 200 MG capsule, Take 200 mg by mouth every 8 (eight) hours. Dosage and instructions not specified , Disp: , Rfl: ;  DISCONTD: HYDROcodone-acetaminophen (VICODIN) 5-500 MG per tablet, Take 1 tablet by mouth every 6 (six) hours as needed.  , Disp: , Rfl:  DISCONTD: levofloxacin (LEVAQUIN) 500 MG tablet, Take 500 mg by mouth daily.  , Disp: , Rfl: ;  DISCONTD: pioglitazone (ACTOS) 15 MG tablet, Take 15 mg by mouth daily. Dosage and instructions not specified. , Disp: , Rfl: ;  DISCONTD: quinapril (ACCUPRIL) 10 MG tablet, Take 10 mg  by mouth daily. Dosage and instructions not specified   , Disp: , Rfl:  DISCONTD: vitamin E 100 UNIT capsule, Take 100 Units by mouth daily. Dosage and instructions not specified. , Disp: , Rfl:   Past Medical History  Diagnosis Date  . Type 2 diabetes mellitus   . IgA nephropathy     Dr. Kristian Covey  . Atrial fibrillation   . Coronary atherosclerosis of native coronary artery     Nonobstructive  . Hyperlipidemia   . Essential hypertension, benign   . Chronic lymphocytic leukemia     Past Surgical History  Procedure Date  . Right total knee replacement   . Open repair left quadriceps tendon. 08/22/07    Dr. Romeo Apple  . Transurethral resection of prostate   . Anal  fissure repair   . Femoral hernia repair     Family History  Problem Relation Age of Onset  . Atrial fibrillation Mother     Social History [name] [name] reports that he quit smoking about 47 years ago. His smoking use included Cigarettes. He quit after 10 years of use. He has never used smokeless tobacco. [name] [name] reports that he does not drink alcohol.  Review of Systems Otherwise negative except as outlined.  Physical Examination Filed Vitals:   10/10/10 0958  BP: 116/65  Pulse: 84  Obese male in no acute distress. HEENT: Conjunctiva and lids are normal, oropharynx with moist mucosa. Neck: Supple, no elevated JVP or carotid bruits, no thyromegaly. Lungs: Clear auscultation, nonlabored. Cardiac: Irregularly irregular, soft systolic murmur at the base, preserved second heart sound, no rub. Abdomen: Obese, protuberant, bowel sounds present, nontender. Skin: Warm and dry. Musculoskeletal: No kyphosis. Extremities: Chronic appearing edema, 2+ pitting, symmetrical, distal pulses one plus. Neuropsychiatric: Alert and oriented x3, hard of hearing, affect appropriate.   ECG Atrial fibrillation at 79 beats per minute with leftward axis, incomplete right bundle-branch block.  Studies Cardiac catheterization 05/17/2008: ANGIOGRAPHIC FINDINGS:   1. Left main coronary artery had 20% plaque in the midportion of the       vessel.   2. The left anterior descending had a 30% stenosis in the proximal       portion, just after a large septal perforator.  There were luminal       irregularities in the midportion of the vessel.  The first and       second diagonal branches were small-caliber vessels but had no       disease.  There was no obstructive disease in the left coronary       system.   3. The circumflex artery had a 40% stenosis in the midportion of the       vessel.  There is a small-to-moderate size first obtuse marginal       branch that was free of disease.   4. There is a  moderate-to-large size ramus intermedius branch and has       luminal irregularities.   5. The right coronary artery is a large dominant vessel and has       luminal irregularities in the proximal mid and distal portions.       The distal right coronary bifurcates into a moderate size posterior       descending artery that has plaque disease and a large       posterolateral segment that has luminal irregularities.   6. Left ventricular angiogram was performed in the RAO projection and       shows normal  systolic function with no wall motion abnormalities.       Ejection fraction is estimated at 55%.  Echocardiogram 05/21/2008: LVEF 55-60%, trace aortic regurgitation, mild mitral regurgitation, mild tricuspid regurgitation, RVSP 35 mm mercury.   Problem List and Plan

## 2010-10-31 ENCOUNTER — Ambulatory Visit (INDEPENDENT_AMBULATORY_CARE_PROVIDER_SITE_OTHER): Payer: Medicare Other | Admitting: *Deleted

## 2010-10-31 DIAGNOSIS — Z7901 Long term (current) use of anticoagulants: Secondary | ICD-10-CM

## 2010-10-31 DIAGNOSIS — I4891 Unspecified atrial fibrillation: Secondary | ICD-10-CM

## 2010-11-28 ENCOUNTER — Ambulatory Visit (INDEPENDENT_AMBULATORY_CARE_PROVIDER_SITE_OTHER): Payer: Medicare Other | Admitting: *Deleted

## 2010-11-28 DIAGNOSIS — I4891 Unspecified atrial fibrillation: Secondary | ICD-10-CM

## 2010-11-28 DIAGNOSIS — Z7901 Long term (current) use of anticoagulants: Secondary | ICD-10-CM

## 2010-11-28 LAB — POCT INR: INR: 2.3

## 2010-12-26 ENCOUNTER — Ambulatory Visit (INDEPENDENT_AMBULATORY_CARE_PROVIDER_SITE_OTHER): Payer: Medicare Other | Admitting: *Deleted

## 2010-12-26 DIAGNOSIS — I4891 Unspecified atrial fibrillation: Secondary | ICD-10-CM

## 2010-12-26 DIAGNOSIS — Z7901 Long term (current) use of anticoagulants: Secondary | ICD-10-CM

## 2011-01-23 ENCOUNTER — Ambulatory Visit (INDEPENDENT_AMBULATORY_CARE_PROVIDER_SITE_OTHER): Payer: Medicare Other | Admitting: *Deleted

## 2011-01-23 DIAGNOSIS — Z7901 Long term (current) use of anticoagulants: Secondary | ICD-10-CM

## 2011-01-23 DIAGNOSIS — I4891 Unspecified atrial fibrillation: Secondary | ICD-10-CM

## 2011-02-20 ENCOUNTER — Encounter: Payer: Medicare Other | Admitting: *Deleted

## 2011-02-24 ENCOUNTER — Encounter (INDEPENDENT_AMBULATORY_CARE_PROVIDER_SITE_OTHER): Payer: Medicare Other | Admitting: *Deleted

## 2011-02-24 ENCOUNTER — Ambulatory Visit (INDEPENDENT_AMBULATORY_CARE_PROVIDER_SITE_OTHER): Payer: Medicare Other | Admitting: *Deleted

## 2011-02-24 DIAGNOSIS — I4891 Unspecified atrial fibrillation: Secondary | ICD-10-CM

## 2011-02-24 DIAGNOSIS — Z7901 Long term (current) use of anticoagulants: Secondary | ICD-10-CM

## 2011-02-24 LAB — POCT INR: INR: 2

## 2011-02-24 NOTE — Progress Notes (Signed)
This encounter was created in error - please disregard.

## 2011-03-27 ENCOUNTER — Ambulatory Visit (INDEPENDENT_AMBULATORY_CARE_PROVIDER_SITE_OTHER): Payer: Medicare Other | Admitting: *Deleted

## 2011-03-27 DIAGNOSIS — Z7901 Long term (current) use of anticoagulants: Secondary | ICD-10-CM

## 2011-03-27 DIAGNOSIS — I4891 Unspecified atrial fibrillation: Secondary | ICD-10-CM

## 2011-03-27 LAB — POCT INR: INR: 2.6

## 2011-04-24 ENCOUNTER — Ambulatory Visit (INDEPENDENT_AMBULATORY_CARE_PROVIDER_SITE_OTHER): Payer: Medicare Other | Admitting: *Deleted

## 2011-04-24 DIAGNOSIS — I4891 Unspecified atrial fibrillation: Secondary | ICD-10-CM

## 2011-04-24 DIAGNOSIS — Z7901 Long term (current) use of anticoagulants: Secondary | ICD-10-CM

## 2011-04-24 LAB — POCT INR: INR: 2.5

## 2011-05-29 ENCOUNTER — Ambulatory Visit (INDEPENDENT_AMBULATORY_CARE_PROVIDER_SITE_OTHER): Payer: Medicare Other | Admitting: *Deleted

## 2011-05-29 DIAGNOSIS — I4891 Unspecified atrial fibrillation: Secondary | ICD-10-CM

## 2011-05-29 DIAGNOSIS — Z7901 Long term (current) use of anticoagulants: Secondary | ICD-10-CM

## 2011-07-02 ENCOUNTER — Telehealth (INDEPENDENT_AMBULATORY_CARE_PROVIDER_SITE_OTHER): Payer: Self-pay | Admitting: *Deleted

## 2011-07-02 ENCOUNTER — Other Ambulatory Visit (INDEPENDENT_AMBULATORY_CARE_PROVIDER_SITE_OTHER): Payer: Self-pay | Admitting: *Deleted

## 2011-07-02 DIAGNOSIS — Z800 Family history of malignant neoplasm of digestive organs: Secondary | ICD-10-CM

## 2011-07-02 DIAGNOSIS — Z86010 Personal history of colonic polyps: Secondary | ICD-10-CM

## 2011-07-02 NOTE — Telephone Encounter (Signed)
Patient needs movi prep 

## 2011-07-06 MED ORDER — PEG-KCL-NACL-NASULF-NA ASC-C 100 G PO SOLR: 1.0000 | Freq: Once | ORAL | Status: DC

## 2011-07-10 ENCOUNTER — Ambulatory Visit (INDEPENDENT_AMBULATORY_CARE_PROVIDER_SITE_OTHER): Payer: Medicare Other | Admitting: *Deleted

## 2011-07-10 DIAGNOSIS — I4891 Unspecified atrial fibrillation: Secondary | ICD-10-CM

## 2011-07-10 DIAGNOSIS — Z7901 Long term (current) use of anticoagulants: Secondary | ICD-10-CM

## 2011-07-10 LAB — POCT INR: INR: 3.4

## 2011-07-10 MED ORDER — WARFARIN SODIUM 5 MG PO TABS: 5.0000 mg | ORAL_TABLET | ORAL | Status: DC

## 2011-07-16 ENCOUNTER — Encounter (INDEPENDENT_AMBULATORY_CARE_PROVIDER_SITE_OTHER): Payer: Self-pay | Admitting: *Deleted

## 2011-08-05 ENCOUNTER — Telehealth (INDEPENDENT_AMBULATORY_CARE_PROVIDER_SITE_OTHER): Payer: Self-pay | Admitting: *Deleted

## 2011-08-05 NOTE — Telephone Encounter (Signed)
PCP/Requesting MD: shah    Name & DOB: [name] [name] Mar 19, 2035      Procedure: tcs  Reason/Indication:  Hx polyps, fam hx crc  Has patient had this procedure before?  yes  If so, when, by whom and where?  10/05  Is there a family history of colon cancer?  yes  Who?  What age when diagnosed?  parents  Is patient diabetic?   yes      Does patient have prosthetic heart valve?  no  Do you have a pacemaker?  no  Has patient had joint replacement within last 12 months?  no  Is patient on Coumadin, Plavix and/or Aspirin? yes  Medications: Current outpatient prescriptions:amLODipine (NORVASC) 5 MG tablet, Take 5 mg by mouth daily.  , Disp: , Rfl: ;  Ascorbic Acid (VITAMIN C) 1000 MG tablet, Take 1,000 mg by mouth daily.  , Disp: , Rfl: ;  aspirin 81 MG tablet, Take 81 mg by mouth daily.  , Disp: , Rfl: ;  atorvastatin (LIPITOR) 10 MG tablet, Take 10 mg by mouth daily. Dosage and instructions not specified  , Disp: , Rfl:  Calcium Carbonate-Vitamin D (CALCIUM + D) 600-200 MG-UNIT TABS, Take by mouth 2 (two) times daily.  , Disp: , Rfl: ;  Cholecalciferol (VITAMIN D3) 1000 UNITS CAPS, Take by mouth. Take one table daily , Disp: , Rfl: ;  digoxin (LANOXIN) 0.25 MG tablet, Take 250 mcg by mouth daily. Dosage and instructions not specified , Disp: , Rfl:  glimepiride (AMARYL) 1 MG tablet, Take 1 mg by mouth 2 (two) times daily. Dosage and instructions not specified., Disp: , Rfl: ;  hydrochlorothiazide 25 MG tablet, Take 25 mg by mouth daily.  , Disp: , Rfl: ;  insulin aspart (NOVOLOG) 100 UNIT/ML injection, Inject into the skin 2 (two) times daily.  , Disp: , Rfl: ;  insulin detemir (LEVEMIR FLEXPEN) 100 UNIT/ML injection, Inject 40 Units into the skin daily with breakfast.  , Disp: , Rfl:  Liraglutide (VICTOZA) 18 MG/3ML SOLN, Inject into the skin. One injection every morning , Disp: , Rfl: ;  lisinopril (PRINIVIL,ZESTRIL) 40 MG tablet, Take 40 mg by mouth daily.  , Disp: , Rfl: ;  LORazepam (ATIVAN) 1  MG tablet, Take 1 mg by mouth at bedtime. UAD. Dosage and instructions not specified, Disp: , Rfl: ;  Multiple Vitamin (MULTIVITAMIN) capsule, Take 1 capsule by mouth daily.  , Disp: , Rfl:  Omega-3 Fatty Acids (FISH OIL) 1000 MG CAPS, Take by mouth. Take 2 tabs twice a day , Disp: , Rfl: ;  peg 3350 powder (MOVIPREP) SOLR, Take 1 kit (100 g total) by mouth once., Disp: 1 kit, Rfl: 0;  pioglitazone (ACTOS) 30 MG tablet, Take 30 mg by mouth daily.  , Disp: , Rfl: ;  vitamin E 400 UNIT capsule, Take 400 Units by mouth daily.  , Disp: , Rfl:  warfarin (COUMADIN) 5 MG tablet, Take 1 tablet (5 mg total) by mouth as directed. Taking 5mg  daily except 7.5mg  on Tuesdays and Fridays, Disp: 45 tablet, Rfl: 3  Allergies: pcn  Medication Adjustment: per dr Karilyn Cota: coumadin 5 days, asa 2 days, decrease levemir to 40 units day before, continue victoza & novolog, stop amaryl  Procedure date & time: 08/19/11 @ 930

## 2011-08-06 NOTE — Telephone Encounter (Signed)
agree

## 2011-08-07 ENCOUNTER — Ambulatory Visit (INDEPENDENT_AMBULATORY_CARE_PROVIDER_SITE_OTHER): Payer: Medicare Other | Admitting: *Deleted

## 2011-08-07 DIAGNOSIS — Z7901 Long term (current) use of anticoagulants: Secondary | ICD-10-CM

## 2011-08-07 DIAGNOSIS — I4891 Unspecified atrial fibrillation: Secondary | ICD-10-CM

## 2011-08-07 LAB — POCT INR: INR: 2.9

## 2011-08-18 MED ORDER — SODIUM CHLORIDE 0.45 % IV SOLN
Freq: Once | INTRAVENOUS | Status: AC
  Administered 2011-08-19: 10:00:00 via INTRAVENOUS

## 2011-08-19 ENCOUNTER — Ambulatory Visit (HOSPITAL_COMMUNITY)
Admission: RE | Admit: 2011-08-19 | Discharge: 2011-08-19 | Disposition: A | Discharge status: Home / Self Care | Payer: Medicare Other | Source: Ambulatory Visit | Attending: Internal Medicine | Admitting: Internal Medicine

## 2011-08-19 ENCOUNTER — Encounter (HOSPITAL_COMMUNITY): Payer: Self-pay | Admitting: *Deleted

## 2011-08-19 ENCOUNTER — Encounter (HOSPITAL_COMMUNITY)
Admission: RE | Disposition: A | Discharge status: Home / Self Care | Payer: Self-pay | Source: Ambulatory Visit | Attending: Internal Medicine

## 2011-08-19 DIAGNOSIS — K5730 Diverticulosis of large intestine without perforation or abscess without bleeding: Secondary | ICD-10-CM | POA: Insufficient documentation

## 2011-08-19 DIAGNOSIS — D126 Benign neoplasm of colon, unspecified: Secondary | ICD-10-CM | POA: Insufficient documentation

## 2011-08-19 DIAGNOSIS — Z860100 Personal history of colon polyps, unspecified: Secondary | ICD-10-CM | POA: Insufficient documentation

## 2011-08-19 DIAGNOSIS — Z79899 Other long term (current) drug therapy: Secondary | ICD-10-CM | POA: Insufficient documentation

## 2011-08-19 DIAGNOSIS — K621 Rectal polyp: Secondary | ICD-10-CM | POA: Insufficient documentation

## 2011-08-19 DIAGNOSIS — E785 Hyperlipidemia, unspecified: Secondary | ICD-10-CM | POA: Insufficient documentation

## 2011-08-19 DIAGNOSIS — Z800 Family history of malignant neoplasm of digestive organs: Secondary | ICD-10-CM | POA: Insufficient documentation

## 2011-08-19 DIAGNOSIS — I10 Essential (primary) hypertension: Secondary | ICD-10-CM | POA: Insufficient documentation

## 2011-08-19 DIAGNOSIS — Z09 Encounter for follow-up examination after completed treatment for conditions other than malignant neoplasm: Secondary | ICD-10-CM | POA: Insufficient documentation

## 2011-08-19 DIAGNOSIS — E119 Type 2 diabetes mellitus without complications: Secondary | ICD-10-CM | POA: Insufficient documentation

## 2011-08-19 DIAGNOSIS — Z01812 Encounter for preprocedural laboratory examination: Secondary | ICD-10-CM | POA: Insufficient documentation

## 2011-08-19 DIAGNOSIS — D128 Benign neoplasm of rectum: Secondary | ICD-10-CM

## 2011-08-19 DIAGNOSIS — Z794 Long term (current) use of insulin: Secondary | ICD-10-CM | POA: Insufficient documentation

## 2011-08-19 DIAGNOSIS — D129 Benign neoplasm of anus and anal canal: Secondary | ICD-10-CM

## 2011-08-19 DIAGNOSIS — K620 Anal polyp: Secondary | ICD-10-CM | POA: Insufficient documentation

## 2011-08-19 HISTORY — PX: COLONOSCOPY: SHX5424

## 2011-08-19 LAB — GLUCOSE, CAPILLARY: Glucose-Capillary: 143 mg/dL — ABNORMAL HIGH (ref 70–99)

## 2011-08-19 SURGERY — COLONOSCOPY
Anesthesia: Moderate Sedation

## 2011-08-19 MED ORDER — MEPERIDINE HCL 50 MG/ML IJ SOLN
INTRAMUSCULAR | Status: DC | PRN
  Administered 2011-08-19 (×2): 25 mg via INTRAVENOUS

## 2011-08-19 MED ORDER — MIDAZOLAM HCL 5 MG/5ML IJ SOLN
INTRAMUSCULAR | Status: AC
  Filled 2011-08-19: qty 10

## 2011-08-19 MED ORDER — MEPERIDINE HCL 50 MG/ML IJ SOLN
INTRAMUSCULAR | Status: AC
  Filled 2011-08-19: qty 1

## 2011-08-19 MED ORDER — MIDAZOLAM HCL 5 MG/5ML IJ SOLN
INTRAMUSCULAR | Status: DC | PRN
  Administered 2011-08-19 (×4): 2 mg via INTRAVENOUS

## 2011-08-19 MED ORDER — STERILE WATER FOR IRRIGATION IR SOLN
Status: DC | PRN
  Administered 2011-08-19: 11:00:00

## 2011-08-19 NOTE — Discharge Instructions (Addendum)
Resume warfarin at usual dose this evening. Resume aspirin in one week. Resume other medications and diet as before. No driving for 24 hours. Physician will contact you with biopsy results.Colon Polyps A polyp is extra tissue that grows inside your body. Colon polyps grow in the large intestine. The large intestine, also called the colon, is part of your digestive system. It is a long, hollow tube at the end of your digestive tract where your body makes and stores stool. Most polyps are not dangerous. They are benign. This means they are not cancerous. But over time, some types of polyps can turn into cancer. Polyps that are smaller than a pea are usually not harmful. But larger polyps could someday become or may already be cancerous. To be safe, doctors remove all polyps and test them.  WHO GETS POLYPS? Anyone can get polyps, but certain people are more likely than others. You may have a greater chance of getting polyps if:  You are over 50.   You have had polyps before.   Someone in your family has had polyps.   Someone in your family has had cancer of the large intestine.   Find out if someone in your family has had polyps. You may also be more likely to get polyps if you:   Eat a lot of fatty foods.   Smoke.   Drink alcohol.   Do not exercise.   Eat too much.  SYMPTOMS  Most small polyps do not cause symptoms. People often do not know they have one until their caregiver finds it during a regular checkup or while testing them for something else. Some people do have symptoms like these:  Bleeding from the anus. You might notice blood on your underwear or on toilet paper after you have had a bowel movement.   Constipation or diarrhea that lasts more than a week.   Blood in the stool. Blood can make stool look black or it can show up as red streaks in the stool.  If you have any of these symptoms, see your caregiver. HOW DOES THE DOCTOR TEST FOR POLYPS? The doctor can use four  tests to check for polyps:  Digital rectal exam. The caregiver wears gloves and checks your rectum (the last part of the large intestine) to see if it feels normal. This test would find polyps only in the rectum. Your caregiver may need to do one of the other tests listed below to find polyps higher up in the intestine.   Barium enema. The caregiver puts a liquid called barium into your rectum before taking x-rays of your large intestine. Barium makes your intestine look white in the pictures. Polyps are dark, so they are easy to see.   Sigmoidoscopy. With this test, the caregiver can see inside your large intestine. A thin flexible tube is placed into your rectum. The device is called a sigmoidoscope, which has a light and a tiny video camera in it. The caregiver uses the sigmoidoscope to look at the last third of your large intestine.   Colonoscopy. This test is like sigmoidoscopy, but the caregiver looks at all of the large intestine. It usually requires sedation. This is the most common method for finding and removing polyps.  TREATMENT   The caregiver will remove the polyp during sigmoidoscopy or colonoscopy. The polyp is then tested for cancer.   If you have had polyps, your caregiver may want you to get tested regularly in the future.  PREVENTION  There  is not one sure way to prevent polyps. You might be able to lower your risk of getting them if you:  Eat more fruits and vegetables and less fatty food.   Do not smoke.   Avoid alcohol.   Exercise every day.   Lose weight if you are overweight.   Eating more calcium and folate can also lower your risk of getting polyps. Some foods that are rich in calcium are milk, cheese, and broccoli. Some foods that are rich in folate are chickpeas, kidney beans, and spinach.   Aspirin might help prevent polyps. Studies are under way.  Document Released: 12/18/2003 Document Revised: 03/12/2011 Document Reviewed: 05/25/2007 [facility] Patient  Information 2012 [place], [place].

## 2011-08-19 NOTE — H&P (Signed)
[name] [name] is an 77 y.o. male.   Chief Complaint: Patient is here for colonoscopy. HPI: Patient is 77 year old Caucasian male with history of colonic polyps and is here for surveillance examination. He has occasional hematochezia. He denies Raylon rectal bleeding abdominal pain or melena. He has umbilicus hernia but does not have any symptoms pertaining to it. Family history is significant for colon carcinoma in his father at age 46 or 72 and he died 10 years later metastatic disease. Mother had part of her colon resected for colonic polyps; she was in her 19s.  Past Medical History  Diagnosis Date  . Type 2 diabetes mellitus   . IgA nephropathy     Dr. Kristian Covey  . Atrial fibrillation   . Coronary atherosclerosis of native coronary artery     Nonobstructive  . Hyperlipidemia   . Essential hypertension, benign   . Chronic lymphocytic leukemia     Past Surgical History  Procedure Date  . Right total knee replacement   . Open repair left quadriceps tendon. 08/22/07    Dr. Romeo Apple  . Transurethral resection of prostate   . Anal fissure repair   . Femoral hernia repair     Family History  Problem Relation Age of Onset  . Atrial fibrillation Mother    Social History:  reports that he quit smoking about 48 years ago. His smoking use included Cigarettes. He quit after 10 years of use. He has never used smokeless tobacco. He reports that he does not drink alcohol or use illicit drugs.  Allergies:  Allergies  Allergen Reactions  . Penicillins Hives    Medications Prior to Admission  Medication Sig Dispense Refill  . amLODipine (NORVASC) 5 MG tablet Take 5 mg by mouth every morning.       . Ascorbic Acid (VITAMIN C) 1000 MG tablet Take 1,000 mg by mouth every morning.       Marland Kitchen aspirin EC 81 MG tablet Take 81 mg by mouth every morning.      Marland Kitchen atorvastatin (LIPITOR) 10 MG tablet Take 10 mg by mouth at bedtime.       . cholecalciferol (VITAMIN D) 1000 UNITS tablet Take 1,000 Units  by mouth every morning.      . digoxin (LANOXIN) 0.25 MG tablet Take 250 mcg by mouth every morning. Dosage and instructions not specified      . glimepiride (AMARYL) 4 MG tablet Take 4 mg by mouth 2 (two) times daily.      . hydrochlorothiazide 25 MG tablet Take 25 mg by mouth every morning.       . insulin aspart (NOVOLOG) 100 UNIT/ML injection Inject 4-10 Units into the skin 2 (two) times daily as needed. For high blood sugar      . insulin detemir (LEVEMIR FLEXPEN) 100 UNIT/ML injection Inject 42 Units into the skin daily with breakfast.       . Liraglutide (VICTOZA) 18 MG/3ML SOLN Inject 18 mg into the skin every morning. One injection every morning      . lisinopril (PRINIVIL,ZESTRIL) 40 MG tablet Take 40 mg by mouth every morning.       Marland Kitchen LORazepam (ATIVAN) 1 MG tablet Take 2 mg by mouth at bedtime.       . Multiple Vitamin (MULITIVITAMIN WITH MINERALS) TABS Take 1 tablet by mouth every morning.      . Omega-3 Fatty Acids (FISH OIL) 1000 MG CAPS Take 2 capsules by mouth every morning. Take 2 tabs twice a day      .  peg 3350 powder (MOVIPREP) SOLR Take 1 kit (100 g total) by mouth once.  1 kit  0  . pioglitazone (ACTOS) 30 MG tablet Take 30 mg by mouth at bedtime.       . vitamin E 400 UNIT capsule Take 400 Units by mouth every morning.       . warfarin (COUMADIN) 5 MG tablet Take 5-7.5 mg by mouth daily. Take 1 and 1/2 tablet (7.5 mg) on Tuesdays, Thursdays and saturdays. Take 1 tablet (5mg ) on all other days        Results for orders placed during the hospital encounter of 08/19/11 (from the past 48 hour(s))  GLUCOSE, CAPILLARY     Status: Abnormal   Collection Time   08/19/11 10:10 AM      Component Value Range Comment   Glucose-Capillary 143 (*) 70 - 99 (mg/dL)    No results found.  Review of Systems  Constitutional: Negative for weight loss.  Gastrointestinal: Negative for abdominal pain, diarrhea, constipation and melena.    Blood pressure 124/60, pulse 73, temperature 97 F  (36.1 C), temperature source Oral, resp. rate 18, SpO2 98.00%. Physical Exam  Constitutional: He appears well-developed and well-nourished.  HENT:  Mouth/Throat: Oropharynx is clear and moist.  Eyes: Conjunctivae are normal. No scleral icterus.  Neck: No thyromegaly present.  Cardiovascular:       Irregular rhythm normal S1 and S2. No murmur or gallop noted.  Respiratory: Effort normal and breath sounds normal.  GI: Soft. He exhibits no distension and no mass. There is no tenderness.       Umbilicus hernia which is soft and reducible.  Musculoskeletal: He exhibits no edema.  Lymphadenopathy:    He has no cervical adenopathy.  Neurological: He is alert.  Skin: Skin is warm.     Assessment/Plan History of colonic polyps. Family history of colon carcinoma. Surveillance colonoscopy.  Calyssa Zobrist U 08/19/2011, 10:44 AM

## 2011-08-19 NOTE — Op Note (Signed)
COLONOSCOPY PROCEDURE REPORT  PATIENT:  [name] [name]  MR#:  [id number] Birthdate:  1934/04/20, 77 y.o., male Endoscopist:  Dr. Malissa Hippo, MD Referred By:  Dr. Bonnetta Barry ref. provider found Procedure Date: 08/19/2011  Procedure:   Colonoscopy with snare polypectomy.  Indications:  Patient is 77 year old Caucasian male with history of colonic adenomas and family history of colon carcinoma.  Informed Consent:  The procedure and risks were reviewed with the patient and informed consent was obtained.  Medications:  Demerol 50 mg IV Versed 8 mg IV  Description of procedure:  After a digital rectal exam was performed, that colonoscope was advanced from the anus through the rectum and colon to the area of the cecum, ileocecal valve and appendiceal orifice. The cecum was deeply intubated. These structures were well-seen and photographed for the record. From the level of the cecum and ileocecal valve, the scope was slowly and cautiously withdrawn. The mucosal surfaces were carefully surveyed utilizing scope tip to flexion to facilitate fold flattening as needed. The scope was pulled down into the rectum where a thorough exam including retroflexion was performed.  Findings:   Prep satisfactory. Few small diverticula at sigmoid and descending colon. 3 small cecal polyps. One ablated via cold biopsy. 2 others were coagulated using APC. 8-10 mm polyp snared from ascending colon. 2 small Mall polyps snared from ascending colon. Another small polyp was coagulated with APC. 5 mm polyp snared from transverse colon. Another 5 mm polyp snared from descending colon. 2 small rectal polyps coagulated with snare tip.  Therapeutic/Diagnostic Maneuvers Performed:  See above  Complications:  None  Cecal Withdrawal Time:  40 minutes  Impression:  Examination performed to cecum. Mild left-sided diverticulosis. 11 polyps treated (5 polyps were snared. One polyp ablated via cold biopsy and rest of the polyps  are coagulated; largest polyp was 8-10 mm at ascending colon).  Recommendations:  Standard instructions given. I will be contacting patient with biopsy results and further recommendations.  Yeison Sippel U  08/19/2011 11:47 AM  CC: Dr. Kirstie Peri, MD, MD & Dr. Bonnetta Barry ref. provider found

## 2011-08-21 ENCOUNTER — Encounter (HOSPITAL_COMMUNITY): Payer: Self-pay | Admitting: Internal Medicine

## 2011-09-01 ENCOUNTER — Telehealth (INDEPENDENT_AMBULATORY_CARE_PROVIDER_SITE_OTHER): Payer: Self-pay | Admitting: *Deleted

## 2011-09-01 NOTE — Telephone Encounter (Signed)
Patient would like for Dr. Karilyn Cota to give him a call with his TCS and biopsy results. The return phone number is [phone] or [phone].

## 2011-09-02 ENCOUNTER — Encounter (INDEPENDENT_AMBULATORY_CARE_PROVIDER_SITE_OTHER): Payer: Self-pay | Admitting: *Deleted

## 2011-09-02 NOTE — Telephone Encounter (Signed)
3 yr TCS noted, procedure note and pathology result letter faxed to PCP  

## 2011-09-02 NOTE — Telephone Encounter (Signed)
Of the polyps that were removed one was serrated adenoma at ascending colon and risks were tubular adenomas. Results reviewed with the patient. He should return for colonoscopy in 3 years. Please send a report to PCP

## 2011-09-25 ENCOUNTER — Ambulatory Visit (INDEPENDENT_AMBULATORY_CARE_PROVIDER_SITE_OTHER): Payer: Medicare Other | Admitting: *Deleted

## 2011-09-25 DIAGNOSIS — I4891 Unspecified atrial fibrillation: Secondary | ICD-10-CM

## 2011-09-25 DIAGNOSIS — Z7901 Long term (current) use of anticoagulants: Secondary | ICD-10-CM

## 2011-10-21 ENCOUNTER — Encounter: Payer: Self-pay | Admitting: Cardiology

## 2011-10-21 ENCOUNTER — Other Ambulatory Visit: Payer: Self-pay | Admitting: Cardiology

## 2011-10-21 ENCOUNTER — Ambulatory Visit (INDEPENDENT_AMBULATORY_CARE_PROVIDER_SITE_OTHER): Payer: Medicare Other | Admitting: Cardiology

## 2011-10-21 VITALS — BP 112/66 | HR 70 | Ht 71.0 in | Wt 280.1 lb

## 2011-10-21 DIAGNOSIS — I2510 Atherosclerotic heart disease of native coronary artery without angina pectoris: Secondary | ICD-10-CM

## 2011-10-21 DIAGNOSIS — R011 Cardiac murmur, unspecified: Secondary | ICD-10-CM

## 2011-10-21 DIAGNOSIS — I4891 Unspecified atrial fibrillation: Secondary | ICD-10-CM

## 2011-10-21 NOTE — Patient Instructions (Addendum)
Your physician recommends that you schedule a follow-up appointment in: 1 year with Dr. McDowell. You will receive a reminder letter in the mail in about 10 months reminding you to call and schedule your appointment. If you don't receive this letter, please contact our office. Your physician recommends that you continue on your current medications as directed. Please refer to the Current Medication list given to you today.  Your physician has requested that you have an echocardiogram. Echocardiography is a painless test that uses sound waves to create images of your heart. It provides your doctor with information about the size and shape of your heart and how well your heart's chambers and valves are working. This procedure takes approximately one hour. There are no restrictions for this procedure.  

## 2011-10-21 NOTE — Progress Notes (Signed)
Clinical Summary [name] is a 77 y.o.male presenting for followup. He was last seen in July 2012. He states that he has been doing reasonably well, has no sense of palpitations, no progressive shortness of breath or chest pain. States he is as active as he is able, still mows his yard.  Recent colonoscopy noted in May with findings of mild left-sided diverticulosis, removal of polyps. This was done for routine surveillance. He denies any obvious melena or hematochezia.  He does have chronic leg edema, wear his compression hose and also elevates his legs.  His last echocardiogram in 2010 showed LVEF of 55-60% with trace aortic regurgitation, mild mitral regurgitation, mild tricuspid regurgitation, and RVSP of 35 mm mercury.   Allergies  Allergen Reactions  . Penicillins Hives    Current Outpatient Prescriptions  Medication Sig Dispense Refill  . amLODipine (NORVASC) 5 MG tablet Take 5 mg by mouth every morning.       . Ascorbic Acid (VITAMIN C) 1000 MG tablet Take 1,000 mg by mouth every morning.       Marland Kitchen atorvastatin (LIPITOR) 10 MG tablet Take 10 mg by mouth at bedtime.       . cholecalciferol (VITAMIN D) 1000 UNITS tablet Take 1,000 Units by mouth every morning.      . digoxin (LANOXIN) 0.25 MG tablet Take 250 mcg by mouth every morning. Dosage and instructions not specified      . glimepiride (AMARYL) 4 MG tablet Take 4 mg by mouth 2 (two) times daily.      . hydrochlorothiazide 25 MG tablet Take 25 mg by mouth every morning.       . insulin aspart (NOVOLOG) 100 UNIT/ML injection Inject 4-10 Units into the skin 2 (two) times daily as needed. For high blood sugar      . insulin detemir (LEVEMIR FLEXPEN) 100 UNIT/ML injection Inject 42 Units into the skin daily with breakfast.       . Liraglutide (VICTOZA) 18 MG/3ML SOLN Inject 30 mg into the skin every morning. One injection every morning      . lisinopril (PRINIVIL,ZESTRIL) 40 MG tablet Take 40 mg by mouth every morning.       Marland Kitchen  LORazepam (ATIVAN) 1 MG tablet Take 2 mg by mouth at bedtime.       . Multiple Vitamin (MULITIVITAMIN WITH MINERALS) TABS Take 1 tablet by mouth every morning.      . Omega-3 Fatty Acids (FISH OIL) 1000 MG CAPS Take 2 capsules by mouth every morning. Take 2 tabs twice a day      . pioglitazone (ACTOS) 30 MG tablet Take 30 mg by mouth at bedtime.       . vitamin E 400 UNIT capsule Take 400 Units by mouth every morning.       . warfarin (COUMADIN) 5 MG tablet Take 5-7.5 mg by mouth daily. Take 1 and 1/2 tablet (7.5 mg) on Tuesdays, Thursdays and saturdays. Take 1 tablet (5mg ) on all other days      . aspirin 81 MG tablet Take 81 mg by mouth daily.        Past Medical History  Diagnosis Date  . Type 2 diabetes mellitus   . IgA nephropathy     Dr. Kristian Covey  . Atrial fibrillation   . Coronary atherosclerosis of native coronary artery     Nonobstructive  . Hyperlipidemia   . Essential hypertension, benign   . Chronic lymphocytic leukemia     Social History Mr.  [name] [name] reports that he quit smoking about 48 years ago. His smoking use included Cigarettes. He quit after 10 years of use. He has never used smokeless tobacco. [name] reports that he does not drink alcohol.  Review of Systems Hard of hearing. Otherwise negative except as outlined above.  Physical Examination Filed Vitals:   10/21/11 1343  BP: 112/66  Pulse: 70    Obese male in no acute distress.  HEENT: Conjunctiva and lids are normal, oropharynx with moist mucosa.  Neck: Supple, no elevated JVP or carotid bruits, no thyromegaly.  Lungs: Clear auscultation, nonlabored.  Cardiac: Irregularly irregular, 2/6 systolic murmur at the base, preserved second heart sound, no rub.  Abdomen: Obese, protuberant, bowel sounds present, nontender.  Skin: Warm and dry.  Musculoskeletal: No kyphosis.  Extremities: Chronic appearing edema, 2+ pitting, symmetrical, distal pulses one plus.  Neuropsychiatric: Alert and oriented x3, hard of  hearing, affect appropriate.    ECG Atrial fibrillation at 73 beats per minute with right bundle branch block.  Problem List and Plan   ATRIAL FIBRILLATION Chronic, no complaint of palpitations, ECG reviewed. Continue medical therapy including Coumadin.  CORONARY ATHEROSCLEROSIS NATIVE CORONARY ARTERY Nonobstructive based on previous assessment without active angina symptoms. Continue followup with Dr. Sherryll Burger for risk factor modification.  Cardiac murmur Seems more prominent compared to previous office visit. Last echocardiogram was in 2010. Will arrange a followup study.    Jonelle Sidle, M.D., F.A.C.C.

## 2011-10-21 NOTE — Assessment & Plan Note (Signed)
Seems more prominent compared to previous office visit. Last echocardiogram was in 2010. Will arrange a followup study.

## 2011-10-21 NOTE — Assessment & Plan Note (Signed)
Nonobstructive based on previous assessment without active angina symptoms. Continue followup with Dr. Sherryll Burger for risk factor modification.

## 2011-10-21 NOTE — Assessment & Plan Note (Signed)
Chronic, no complaint of palpitations, ECG reviewed. Continue medical therapy including Coumadin.

## 2011-10-27 ENCOUNTER — Ambulatory Visit (INDEPENDENT_AMBULATORY_CARE_PROVIDER_SITE_OTHER): Payer: Medicare Other | Admitting: *Deleted

## 2011-10-27 DIAGNOSIS — I4891 Unspecified atrial fibrillation: Secondary | ICD-10-CM

## 2011-10-27 DIAGNOSIS — Z7901 Long term (current) use of anticoagulants: Secondary | ICD-10-CM

## 2011-10-29 ENCOUNTER — Telehealth: Payer: Self-pay | Admitting: *Deleted

## 2011-10-29 NOTE — Telephone Encounter (Signed)
Patient informed. 

## 2011-10-29 NOTE — Telephone Encounter (Signed)
Message copied by Eustace Moore on Thu Oct 29, 2011 10:54 AM ------      Message from: MCDOWELL, Illene Bolus      Created: Wed Oct 28, 2011  9:31 AM       Reviewed. LV function normal at 65%, mild mitral regurgitation, trace aortic regurgitation, RV pressure stable. No major changes.

## 2011-11-27 ENCOUNTER — Ambulatory Visit (INDEPENDENT_AMBULATORY_CARE_PROVIDER_SITE_OTHER): Payer: Medicare Other | Admitting: *Deleted

## 2011-11-27 DIAGNOSIS — I4891 Unspecified atrial fibrillation: Secondary | ICD-10-CM

## 2011-11-27 DIAGNOSIS — Z7901 Long term (current) use of anticoagulants: Secondary | ICD-10-CM

## 2011-11-27 LAB — POCT INR: INR: 2.9

## 2011-12-25 ENCOUNTER — Ambulatory Visit (INDEPENDENT_AMBULATORY_CARE_PROVIDER_SITE_OTHER): Payer: Medicare Other | Admitting: *Deleted

## 2011-12-25 DIAGNOSIS — Z7901 Long term (current) use of anticoagulants: Secondary | ICD-10-CM

## 2011-12-25 DIAGNOSIS — I4891 Unspecified atrial fibrillation: Secondary | ICD-10-CM

## 2011-12-25 MED ORDER — WARFARIN SODIUM 5 MG PO TABS: ORAL_TABLET | ORAL | Status: DC

## 2011-12-29 ENCOUNTER — Encounter: Payer: Medicare Other | Admitting: Hematology and Oncology

## 2011-12-29 DIAGNOSIS — N400 Enlarged prostate without lower urinary tract symptoms: Secondary | ICD-10-CM

## 2011-12-29 DIAGNOSIS — I2510 Atherosclerotic heart disease of native coronary artery without angina pectoris: Secondary | ICD-10-CM

## 2011-12-29 DIAGNOSIS — C9110 Chronic lymphocytic leukemia of B-cell type not having achieved remission: Secondary | ICD-10-CM

## 2011-12-29 DIAGNOSIS — E119 Type 2 diabetes mellitus without complications: Secondary | ICD-10-CM

## 2012-01-22 ENCOUNTER — Ambulatory Visit (INDEPENDENT_AMBULATORY_CARE_PROVIDER_SITE_OTHER): Payer: Medicare Other | Admitting: *Deleted

## 2012-01-22 DIAGNOSIS — I4891 Unspecified atrial fibrillation: Secondary | ICD-10-CM

## 2012-01-22 DIAGNOSIS — Z7901 Long term (current) use of anticoagulants: Secondary | ICD-10-CM

## 2012-03-08 ENCOUNTER — Ambulatory Visit (INDEPENDENT_AMBULATORY_CARE_PROVIDER_SITE_OTHER): Payer: Medicare Other | Admitting: *Deleted

## 2012-03-08 DIAGNOSIS — Z7901 Long term (current) use of anticoagulants: Secondary | ICD-10-CM

## 2012-03-08 DIAGNOSIS — I4891 Unspecified atrial fibrillation: Secondary | ICD-10-CM

## 2012-04-05 ENCOUNTER — Ambulatory Visit (INDEPENDENT_AMBULATORY_CARE_PROVIDER_SITE_OTHER): Payer: Medicare Other | Admitting: *Deleted

## 2012-04-05 DIAGNOSIS — I4891 Unspecified atrial fibrillation: Secondary | ICD-10-CM

## 2012-04-05 DIAGNOSIS — Z7901 Long term (current) use of anticoagulants: Secondary | ICD-10-CM

## 2012-04-05 LAB — POCT INR: INR: 1.9

## 2012-05-10 ENCOUNTER — Ambulatory Visit (INDEPENDENT_AMBULATORY_CARE_PROVIDER_SITE_OTHER): Payer: Medicare Other | Admitting: *Deleted

## 2012-05-10 DIAGNOSIS — I4891 Unspecified atrial fibrillation: Secondary | ICD-10-CM

## 2012-05-10 DIAGNOSIS — Z7901 Long term (current) use of anticoagulants: Secondary | ICD-10-CM

## 2012-06-07 ENCOUNTER — Ambulatory Visit (INDEPENDENT_AMBULATORY_CARE_PROVIDER_SITE_OTHER): Payer: Medicare Other | Admitting: *Deleted

## 2012-06-07 DIAGNOSIS — Z7901 Long term (current) use of anticoagulants: Secondary | ICD-10-CM

## 2012-06-07 DIAGNOSIS — I4891 Unspecified atrial fibrillation: Secondary | ICD-10-CM

## 2012-07-05 ENCOUNTER — Ambulatory Visit (INDEPENDENT_AMBULATORY_CARE_PROVIDER_SITE_OTHER): Payer: Medicare Other | Admitting: *Deleted

## 2012-07-05 DIAGNOSIS — Z7901 Long term (current) use of anticoagulants: Secondary | ICD-10-CM

## 2012-07-05 DIAGNOSIS — I4891 Unspecified atrial fibrillation: Secondary | ICD-10-CM

## 2012-07-05 LAB — POCT INR: INR: 2.3

## 2012-08-09 ENCOUNTER — Other Ambulatory Visit (HOSPITAL_COMMUNITY): Payer: Self-pay | Admitting: Podiatry

## 2012-08-09 DIAGNOSIS — Z01818 Encounter for other preprocedural examination: Secondary | ICD-10-CM

## 2012-08-09 DIAGNOSIS — E119 Type 2 diabetes mellitus without complications: Secondary | ICD-10-CM

## 2012-08-12 ENCOUNTER — Ambulatory Visit (HOSPITAL_COMMUNITY)
Admission: RE | Admit: 2012-08-12 | Discharge: 2012-08-12 | Disposition: A | Discharge status: Home / Self Care | Payer: Medicare Other | Source: Ambulatory Visit | Attending: Podiatry | Admitting: Podiatry

## 2012-08-12 DIAGNOSIS — E119 Type 2 diabetes mellitus without complications: Secondary | ICD-10-CM | POA: Insufficient documentation

## 2012-08-12 DIAGNOSIS — Z01818 Encounter for other preprocedural examination: Secondary | ICD-10-CM | POA: Insufficient documentation

## 2012-08-16 ENCOUNTER — Ambulatory Visit (INDEPENDENT_AMBULATORY_CARE_PROVIDER_SITE_OTHER): Payer: Medicare Other | Admitting: *Deleted

## 2012-08-16 DIAGNOSIS — Z7901 Long term (current) use of anticoagulants: Secondary | ICD-10-CM

## 2012-08-16 DIAGNOSIS — I4891 Unspecified atrial fibrillation: Secondary | ICD-10-CM

## 2012-08-16 LAB — POCT INR: INR: 2.5

## 2012-08-25 ENCOUNTER — Other Ambulatory Visit: Payer: Self-pay | Admitting: Podiatry

## 2012-09-01 ENCOUNTER — Encounter (HOSPITAL_COMMUNITY): Payer: Self-pay | Admitting: Pharmacy Technician

## 2012-09-06 NOTE — Addendum Note (Signed)
Addended by: Ferman Hamming on: 09/06/2012 08:06 AM   Modules accepted: Orders

## 2012-09-15 ENCOUNTER — Ambulatory Visit (HOSPITAL_COMMUNITY)
Admission: RE | Admit: 2012-09-15 | Discharge: 2012-09-15 | Disposition: A | Discharge status: Home / Self Care | Payer: Medicare Other | Source: Ambulatory Visit | Attending: Podiatry | Admitting: Podiatry

## 2012-09-15 ENCOUNTER — Encounter (HOSPITAL_COMMUNITY): Payer: Self-pay | Admitting: *Deleted

## 2012-09-15 ENCOUNTER — Encounter (HOSPITAL_COMMUNITY)
Admission: RE | Disposition: A | Discharge status: Home / Self Care | Payer: Self-pay | Source: Ambulatory Visit | Attending: Podiatry

## 2012-09-15 ENCOUNTER — Ambulatory Visit (HOSPITAL_COMMUNITY): Payer: Medicare Other

## 2012-09-15 DIAGNOSIS — E119 Type 2 diabetes mellitus without complications: Secondary | ICD-10-CM | POA: Insufficient documentation

## 2012-09-15 DIAGNOSIS — M2042 Other hammer toe(s) (acquired), left foot: Secondary | ICD-10-CM

## 2012-09-15 DIAGNOSIS — M79609 Pain in unspecified limb: Secondary | ICD-10-CM | POA: Insufficient documentation

## 2012-09-15 DIAGNOSIS — Z01812 Encounter for preprocedural laboratory examination: Secondary | ICD-10-CM | POA: Insufficient documentation

## 2012-09-15 DIAGNOSIS — M2040 Other hammer toe(s) (acquired), unspecified foot: Secondary | ICD-10-CM | POA: Insufficient documentation

## 2012-09-15 DIAGNOSIS — Z794 Long term (current) use of insulin: Secondary | ICD-10-CM | POA: Insufficient documentation

## 2012-09-15 HISTORY — PX: AMPUTATION: SHX166

## 2012-09-15 LAB — GLUCOSE, CAPILLARY: Glucose-Capillary: 149 mg/dL — ABNORMAL HIGH (ref 70–99)

## 2012-09-15 SURGERY — AMPUTATION DIGIT
Anesthesia: LOCAL | Site: Toe | Laterality: Left | Wound class: Clean

## 2012-09-15 MED ORDER — 0.9 % SODIUM CHLORIDE (POUR BTL) OPTIME
TOPICAL | Status: DC | PRN
  Administered 2012-09-15: 500 mL

## 2012-09-15 MED ORDER — CLINDAMYCIN PHOSPHATE 600 MG/50ML IV SOLN
INTRAVENOUS | Status: AC
  Filled 2012-09-15: qty 50

## 2012-09-15 MED ORDER — LIDOCAINE HCL (PF) 1 % IJ SOLN
INTRAMUSCULAR | Status: AC
  Filled 2012-09-15: qty 30

## 2012-09-15 MED ORDER — BUPIVACAINE HCL (PF) 0.5 % IJ SOLN
INTRAMUSCULAR | Status: AC
  Filled 2012-09-15: qty 30

## 2012-09-15 MED ORDER — LACTATED RINGERS IV SOLN
INTRAVENOUS | Status: DC
  Administered 2012-09-15: 20 mL/h via INTRAVENOUS

## 2012-09-15 MED ORDER — BUPIVACAINE HCL (PF) 0.5 % IJ SOLN
INTRAMUSCULAR | Status: DC | PRN
  Administered 2012-09-15: 5 mL

## 2012-09-15 MED ORDER — CLINDAMYCIN PHOSPHATE 600 MG/50ML IV SOLN
600.0000 mg | Freq: Once | INTRAVENOUS | Status: AC
  Administered 2012-09-15: 600 mg via INTRAVENOUS

## 2012-09-15 SURGICAL SUPPLY — 37 items
BAG HAMPER (MISCELLANEOUS) ×2 IMPLANT
BANDAGE CONFORM 2  STR LF (GAUZE/BANDAGES/DRESSINGS) ×2 IMPLANT
BANDAGE ELASTIC 4 VELCRO NS (GAUZE/BANDAGES/DRESSINGS) ×2 IMPLANT
BANDAGE ESMARK 4X12 BL STRL LF (DISPOSABLE) ×1 IMPLANT
BANDAGE GAUZE ELAST BULKY 4 IN (GAUZE/BANDAGES/DRESSINGS) ×2 IMPLANT
BLADE AVERAGE 25X9 (BLADE) IMPLANT
BLADE SURG 15 STRL LF DISP TIS (BLADE) ×2 IMPLANT
BLADE SURG 15 STRL SS (BLADE) ×4
BNDG CMPR 12X4 ELC STRL LF (DISPOSABLE) ×1
BNDG ESMARK 4X12 BLUE STRL LF (DISPOSABLE) ×2
CLOTH BEACON ORANGE TIMEOUT ST (SAFETY) ×2 IMPLANT
COVER LIGHT HANDLE STERIS (MISCELLANEOUS) ×4 IMPLANT
CUFF TOURNIQUET SINGLE 18IN (TOURNIQUET CUFF) ×2 IMPLANT
DECANTER SPIKE VIAL GLASS SM (MISCELLANEOUS) ×2 IMPLANT
DRSG ADAPTIC 3X8 NADH LF (GAUZE/BANDAGES/DRESSINGS) ×2 IMPLANT
ELECT REM PT RETURN 9FT ADLT (ELECTROSURGICAL) ×2
ELECTRODE REM PT RTRN 9FT ADLT (ELECTROSURGICAL) ×1 IMPLANT
GLOVE BIO SURGEON STRL SZ7.5 (GLOVE) ×2 IMPLANT
GLOVE ECLIPSE 6.5 STRL STRAW (GLOVE) ×1 IMPLANT
GLOVE INDICATOR 7.0 STRL GRN (GLOVE) ×2 IMPLANT
GOWN STRL REIN XL XLG (GOWN DISPOSABLE) ×4 IMPLANT
KIT ROOM TURNOVER APOR (KITS) ×2 IMPLANT
MANIFOLD NEPTUNE II (INSTRUMENTS) ×2 IMPLANT
NDL HYPO 27GX1-1/4 (NEEDLE) ×2 IMPLANT
NEEDLE HYPO 27GX1-1/4 (NEEDLE) ×4 IMPLANT
NS IRRIG 1000ML POUR BTL (IV SOLUTION) ×2 IMPLANT
PACK BASIC LIMB (CUSTOM PROCEDURE TRAY) ×2 IMPLANT
PAD ARMBOARD 7.5X6 YLW CONV (MISCELLANEOUS) ×2 IMPLANT
RASP SM TEAR CROSS CUT (RASP) IMPLANT
SET BASIN LINEN APH (SET/KITS/TRAYS/PACK) ×2 IMPLANT
SOL PREP PROV IODINE SCRUB 4OZ (MISCELLANEOUS) ×2 IMPLANT
SPONGE GAUZE 4X4 12PLY (GAUZE/BANDAGES/DRESSINGS) ×2 IMPLANT
SPONGE LAP 18X18 X RAY DECT (DISPOSABLE) ×1 IMPLANT
SUT ETHILON 4 0 PS 2 18 (SUTURE) ×1 IMPLANT
SUT PROLENE 4 0 PS 2 18 (SUTURE) ×1 IMPLANT
SUT VIC AB 4-0 PS2 27 (SUTURE) IMPLANT
SYR CONTROL 10ML LL (SYRINGE) ×4 IMPLANT

## 2012-09-15 NOTE — H&P (Signed)
HISTORY AND PHYSICAL INTERVAL NOTE:  09/15/2012  12:09 PM  [name]  has presented today for surgery, with the diagnosis of hammer toe deformity 3rd toe left foot.  The various methods of treatment have been discussed with the patient.  No guarantees were given.  After consideration of risks, benefits and other options for treatment, the patient has consented to surgery.  I have reviewed the patients' chart and labs.    Patient Vitals for the past 24 hrs:  BP Temp Temp src Pulse Resp SpO2 Height Weight  09/15/12 1126 129/61 mmHg 97.6 F (36.4 C) Oral 81 22 93 % - -  09/15/12 1108 - - - - - - 5\' 11"  (1.803 m) 124.739 kg (275 lb)    A history and physical examination was performed in my office.  The patient was reexamined.  There have been no changes to this history and physical examination.  Dallas Schimke, DPM

## 2012-09-15 NOTE — Op Note (Signed)
OPERATIVE NOTE  DATE OF PROCEDURE:  09/15/2012  SURGEON:   Dallas Schimke, DPM  OR STAFF:   Circulator: Lennox Pippins, RN; Rogene Houston, RN Relief Circulator: Eliane Decree Page, RN Scrub Person: Diana Eves, CST   PREOPERATIVE DIAGNOSIS:   Hammer toe deformity 3rd toe left foot  POSTOPERATIVE DIAGNOSIS: Same  PROCEDURE: Amputation of the distal aspect of the 3rd toe left foot  ANESTHESIA:  Local   HEMOSTASIS:   Pneumatic ankle tourniquet set at 250 mmHg  ESTIMATED BLOOD LOSS:   Minimal (<5 cc)  MATERIALS USED:  None  INJECTABLES: Marcaine 0.5% plain; 5mL  PATHOLOGY:   Distal aspect of the left third toe  COMPLICATIONS:   None  INDICATIONS:  Painful hammertoe deformity of the left 3rd toe that failed to respond to non-surgical care.  DESCRIPTION OF THE PROCEDURE:   The patient was brought to the operating room and placed on the operative table in the supine position. The foot was anesthetized using 0.5% Marcaine plain.  The foot was scrubbed prepped and draped in the usual sterile manner.  The limb was then elevated and exsanguinated and the pneumatic ankle tourniquet inflated to 250 mmHg.  Attention was directed to the distal aspect of the left third toe.  A fishmouth incision was performed.  Dissection was continued deep down to the level of the distal interphalangeal joint.  The distal interphalangeal joint was disarticulated using a #15 blade.  The distal aspect of the toe was removed and passed from the operative field.  The wound was irrigated with copious amounts of sterile irrigant.  The skin was reapproximated using 4-0 nylon and a simple suture technique.  A sterile compressive dressing was applied to the left foot.  The pneumatic ankle tourniquet was deflated and a prompt hyperemic response was noted to all digits of the left foot.  The patient tolerated the procedure and anesthesia well.  He was transferred from the operating room to the  post anesthesia care unit with vital signs stable and vascular status intact to all digits of the left foot.

## 2012-09-16 ENCOUNTER — Encounter (HOSPITAL_COMMUNITY): Payer: Self-pay | Admitting: Podiatry

## 2012-09-23 ENCOUNTER — Telehealth: Payer: Self-pay | Admitting: *Deleted

## 2012-09-23 NOTE — Telephone Encounter (Signed)
Pt needed to rescheduled INR appt.  Had partial toe amputation.  Is on bedrest til 7/1.  Will come for INR check that day.

## 2012-09-23 NOTE — Telephone Encounter (Signed)
Please call patient- has questions.

## 2012-10-04 ENCOUNTER — Ambulatory Visit (INDEPENDENT_AMBULATORY_CARE_PROVIDER_SITE_OTHER): Payer: Medicare Other | Admitting: *Deleted

## 2012-10-04 DIAGNOSIS — Z7901 Long term (current) use of anticoagulants: Secondary | ICD-10-CM

## 2012-10-04 DIAGNOSIS — I4891 Unspecified atrial fibrillation: Secondary | ICD-10-CM

## 2012-10-18 ENCOUNTER — Ambulatory Visit (INDEPENDENT_AMBULATORY_CARE_PROVIDER_SITE_OTHER): Payer: Medicare Other | Admitting: *Deleted

## 2012-10-18 DIAGNOSIS — Z7901 Long term (current) use of anticoagulants: Secondary | ICD-10-CM

## 2012-10-18 DIAGNOSIS — I4891 Unspecified atrial fibrillation: Secondary | ICD-10-CM

## 2012-10-18 LAB — POCT INR: INR: 1.6

## 2012-10-28 ENCOUNTER — Ambulatory Visit (INDEPENDENT_AMBULATORY_CARE_PROVIDER_SITE_OTHER): Payer: Medicare Other | Admitting: Cardiology

## 2012-10-28 ENCOUNTER — Encounter: Payer: Self-pay | Admitting: Cardiology

## 2012-10-28 VITALS — BP 111/70 | HR 77 | Ht 71.0 in | Wt 275.8 lb

## 2012-10-28 DIAGNOSIS — I4891 Unspecified atrial fibrillation: Secondary | ICD-10-CM

## 2012-10-28 DIAGNOSIS — R011 Cardiac murmur, unspecified: Secondary | ICD-10-CM

## 2012-10-28 DIAGNOSIS — E782 Mixed hyperlipidemia: Secondary | ICD-10-CM

## 2012-10-28 DIAGNOSIS — I2510 Atherosclerotic heart disease of native coronary artery without angina pectoris: Secondary | ICD-10-CM

## 2012-10-28 NOTE — Assessment & Plan Note (Signed)
Sclerotic aortic valve with trace aortic regurgitation, mild mitral regurgitation.

## 2012-10-28 NOTE — Patient Instructions (Addendum)

## 2012-10-28 NOTE — Progress Notes (Signed)
Clinical Summary [name] [name] is a 78 y.o.male last seen in July 2013. He has been doing well from a cardiac perspective, does not report any sense of palpitations, no dizziness or syncope. He is not having any exertional chest pain symptoms.  Echocardiogram from July 2013 revealed mild to moderate LVH with LVEF greater than 65%, moderate left atrial enlargement, mild mitral regurgitation, trace aortic regurgitation, mildly dilated right ventricle and moderately dilated the right atrium, RVSP 40 mmHg.  ECG today shows atrial fibrillation with right bundle branch block and left anterior fascicular block.  Recent lab work from June showed LDL 73, normal AST and ALT, BUN 27, creatinine 0.8, potassium 4.4, hemoglobin 14.6, platelets 182, TSH 1.5.  Allergies  Allergen Reactions  . Penicillins Hives    Current Outpatient Prescriptions  Medication Sig Dispense Refill  . amLODipine (NORVASC) 5 MG tablet Take 5 mg by mouth every morning.       . Ascorbic Acid (VITAMIN C) 1000 MG tablet Take 1,000 mg by mouth every morning.       Marland Kitchen aspirin 81 MG tablet Take 81 mg by mouth daily.      Marland Kitchen atorvastatin (LIPITOR) 10 MG tablet Take 10 mg by mouth at bedtime.       . calcium carbonate (OS-CAL) 600 MG TABS Take 600 mg by mouth daily.      . cholecalciferol (VITAMIN D) 1000 UNITS tablet Take 1,000 Units by mouth every morning.      . Coenzyme Q10 (CO Q-10) 100 MG CAPS Take 100 mg by mouth daily.      . digoxin (LANOXIN) 0.25 MG tablet Take 250 mcg by mouth every morning. Dosage and instructions not specified      . glimepiride (AMARYL) 4 MG tablet Take 4 mg by mouth 2 (two) times daily.      . hydrochlorothiazide 25 MG tablet Take 25 mg by mouth every morning.       . insulin aspart (NOVOLOG) 100 UNIT/ML injection Inject 4-18 Units into the skin 2 (two) times daily as needed. For high blood sugar, takes before lunch and dinner      . insulin detemir (LEVEMIR FLEXPEN) 100 UNIT/ML injection Inject 42 Units  into the skin daily with breakfast.       . Liraglutide (VICTOZA) 18 MG/3ML SOLN Inject 1.8 mg into the skin every morning. One injection every morning      . lisinopril (PRINIVIL,ZESTRIL) 40 MG tablet Take 40 mg by mouth daily after supper.       Marland Kitchen LORazepam (ATIVAN) 1 MG tablet Take 2 mg by mouth at bedtime.       . Multiple Vitamin (MULITIVITAMIN WITH MINERALS) TABS Take 1 tablet by mouth every morning.      . Omega-3 Fatty Acids (FISH OIL) 1200 MG CAPS Take 2,400 mg by mouth 2 (two) times daily.      . pioglitazone (ACTOS) 15 MG tablet Take 15 mg by mouth daily after supper.      . vitamin E 400 UNIT capsule Take 400 Units by mouth every morning.       . warfarin (COUMADIN) 5 MG tablet Take 1 tablet on Sun, Mon, Wed, and Friday and take 1 1/2 tablets all the other days.       No current facility-administered medications for this visit.    Past Medical History  Diagnosis Date  . Type 2 diabetes mellitus   . IgA nephropathy     Dr. Kristian Covey  . Atrial  fibrillation   . Coronary atherosclerosis of native coronary artery     Nonobstructive  . Hyperlipidemia   . Essential hypertension, benign   . Chronic lymphocytic leukemia     Social History [name] [name] reports that he quit smoking about 49 years ago. His smoking use included Cigarettes. He smoked 0.00 packs per day for 10 years. He has never used smokeless tobacco. [name] [name] reports that he does not drink alcohol.  Review of Systems Part of hearing. Stable appetite, no reported bleeding problems. Had some minor foot surgery since I saw him. Otherwise negative.  Physical Examination Filed Vitals:   10/28/12 1308  BP: 111/70  Pulse: 77   Filed Weights   10/28/12 1308  Weight: 275 lb 12.8 oz (125.102 kg)    Appears comfortable at rest. HEENT: Conjunctiva and lids are normal, oropharynx with moist mucosa.  Neck: Supple, no elevated JVP or carotid bruits, no thyromegaly.  Lungs: Clear auscultation, nonlabored.  Cardiac:  Irregularly irregular, 2/6 systolic murmur at the base, preserved second heart sound, no rub.  Abdomen: Obese, protuberant, bowel sounds present, nontender.  Skin: Warm and dry.  Musculoskeletal: No kyphosis.  Extremities: Chronic appearing edema, 2+ pitting, symmetrical, distal pulses one plus.  Neuropsychiatric: Alert and oriented x3, hard of hearing, affect appropriate.    Problem List and Plan   Atrial fibrillation Chronic, symptomatically stable. ECG reviewed. Continue medical therapy including Coumadin.  Cardiac murmur Sclerotic aortic valve with trace aortic regurgitation, mild mitral regurgitation.  CORONARY ATHEROSCLEROSIS NATIVE CORONARY ARTERY Nonobstructive by prior assessment, no active angina symptoms.  Mixed hyperlipidemia Recent LDL 73.    Jonelle Sidle, M.D., F.A.C.C.

## 2012-10-28 NOTE — Assessment & Plan Note (Signed)
Recent LDL 73.

## 2012-10-28 NOTE — Assessment & Plan Note (Signed)
Nonobstructive by prior assessment, no active angina symptoms.

## 2012-10-28 NOTE — Assessment & Plan Note (Signed)
Chronic, symptomatically stable. ECG reviewed. Continue medical therapy including Coumadin.

## 2012-11-01 ENCOUNTER — Ambulatory Visit (INDEPENDENT_AMBULATORY_CARE_PROVIDER_SITE_OTHER): Payer: Medicare Other | Admitting: *Deleted

## 2012-11-01 DIAGNOSIS — I4891 Unspecified atrial fibrillation: Secondary | ICD-10-CM

## 2012-11-01 DIAGNOSIS — Z7901 Long term (current) use of anticoagulants: Secondary | ICD-10-CM

## 2012-11-01 LAB — POCT INR: INR: 1.9

## 2012-11-09 ENCOUNTER — Other Ambulatory Visit: Payer: Self-pay

## 2012-11-22 ENCOUNTER — Ambulatory Visit (INDEPENDENT_AMBULATORY_CARE_PROVIDER_SITE_OTHER): Payer: Medicare Other | Admitting: *Deleted

## 2012-11-22 DIAGNOSIS — Z7901 Long term (current) use of anticoagulants: Secondary | ICD-10-CM

## 2012-11-22 DIAGNOSIS — I4891 Unspecified atrial fibrillation: Secondary | ICD-10-CM

## 2012-12-27 ENCOUNTER — Ambulatory Visit (INDEPENDENT_AMBULATORY_CARE_PROVIDER_SITE_OTHER): Payer: Medicare Other | Admitting: *Deleted

## 2012-12-27 DIAGNOSIS — I4891 Unspecified atrial fibrillation: Secondary | ICD-10-CM

## 2012-12-27 DIAGNOSIS — Z7901 Long term (current) use of anticoagulants: Secondary | ICD-10-CM

## 2012-12-27 MED ORDER — WARFARIN SODIUM 5 MG PO TABS: 5.0000 mg | ORAL_TABLET | ORAL | Status: DC

## 2013-02-07 ENCOUNTER — Ambulatory Visit (INDEPENDENT_AMBULATORY_CARE_PROVIDER_SITE_OTHER): Payer: Medicare Other | Admitting: *Deleted

## 2013-02-07 DIAGNOSIS — I4891 Unspecified atrial fibrillation: Secondary | ICD-10-CM

## 2013-02-07 DIAGNOSIS — Z7901 Long term (current) use of anticoagulants: Secondary | ICD-10-CM

## 2013-02-07 LAB — POCT INR: INR: 3

## 2013-02-09 ENCOUNTER — Other Ambulatory Visit: Payer: Self-pay

## 2013-03-21 ENCOUNTER — Ambulatory Visit (INDEPENDENT_AMBULATORY_CARE_PROVIDER_SITE_OTHER): Payer: Medicare Other | Admitting: *Deleted

## 2013-03-21 DIAGNOSIS — I4891 Unspecified atrial fibrillation: Secondary | ICD-10-CM

## 2013-03-21 DIAGNOSIS — Z7901 Long term (current) use of anticoagulants: Secondary | ICD-10-CM

## 2013-03-27 ENCOUNTER — Other Ambulatory Visit: Payer: Self-pay | Admitting: Podiatry

## 2013-04-11 ENCOUNTER — Encounter (HOSPITAL_COMMUNITY): Payer: Self-pay | Admitting: Pharmacy Technician

## 2013-04-11 ENCOUNTER — Ambulatory Visit (INDEPENDENT_AMBULATORY_CARE_PROVIDER_SITE_OTHER): Payer: Medicare Other | Admitting: *Deleted

## 2013-04-11 DIAGNOSIS — I4891 Unspecified atrial fibrillation: Secondary | ICD-10-CM

## 2013-04-11 DIAGNOSIS — Z7901 Long term (current) use of anticoagulants: Secondary | ICD-10-CM

## 2013-04-11 LAB — POCT INR: INR: 2.2

## 2013-04-19 ENCOUNTER — Other Ambulatory Visit: Payer: Self-pay | Admitting: Podiatry

## 2013-04-20 ENCOUNTER — Ambulatory Visit (HOSPITAL_COMMUNITY): Payer: Medicare Other

## 2013-04-20 ENCOUNTER — Encounter (HOSPITAL_COMMUNITY): Payer: Self-pay | Admitting: *Deleted

## 2013-04-20 ENCOUNTER — Encounter (HOSPITAL_COMMUNITY)
Admission: RE | Disposition: A | Discharge status: Home / Self Care | Payer: Self-pay | Source: Ambulatory Visit | Attending: Podiatry

## 2013-04-20 ENCOUNTER — Ambulatory Visit (HOSPITAL_COMMUNITY)
Admission: RE | Admit: 2013-04-20 | Discharge: 2013-04-20 | Disposition: A | Discharge status: Home / Self Care | Payer: Medicare Other | Source: Ambulatory Visit | Attending: Podiatry | Admitting: Podiatry

## 2013-04-20 DIAGNOSIS — Z01812 Encounter for preprocedural laboratory examination: Secondary | ICD-10-CM | POA: Diagnosis not present

## 2013-04-20 DIAGNOSIS — Z7982 Long term (current) use of aspirin: Secondary | ICD-10-CM | POA: Diagnosis not present

## 2013-04-20 DIAGNOSIS — M2040 Other hammer toe(s) (acquired), unspecified foot: Secondary | ICD-10-CM | POA: Diagnosis present

## 2013-04-20 DIAGNOSIS — Z794 Long term (current) use of insulin: Secondary | ICD-10-CM | POA: Insufficient documentation

## 2013-04-20 DIAGNOSIS — E119 Type 2 diabetes mellitus without complications: Secondary | ICD-10-CM | POA: Insufficient documentation

## 2013-04-20 DIAGNOSIS — M2042 Other hammer toe(s) (acquired), left foot: Secondary | ICD-10-CM

## 2013-04-20 HISTORY — PX: AMPUTATION: SHX166

## 2013-04-20 LAB — GLUCOSE, CAPILLARY
GLUCOSE-CAPILLARY: 84 mg/dL (ref 70–99)
Glucose-Capillary: 111 mg/dL — ABNORMAL HIGH (ref 70–99)

## 2013-04-20 SURGERY — AMPUTATION DIGIT
Anesthesia: LOCAL | Site: Toe | Laterality: Left

## 2013-04-20 MED ORDER — BUPIVACAINE HCL (PF) 0.5 % IJ SOLN
INTRAMUSCULAR | Status: DC | PRN
  Administered 2013-04-20: 3.5 mL

## 2013-04-20 MED ORDER — LIDOCAINE HCL (PF) 1 % IJ SOLN
INTRAMUSCULAR | Status: AC
  Filled 2013-04-20: qty 30

## 2013-04-20 MED ORDER — CLINDAMYCIN PHOSPHATE 600 MG/50ML IV SOLN
600.0000 mg | Freq: Once | INTRAVENOUS | Status: AC
  Administered 2013-04-20: 600 mg via INTRAVENOUS

## 2013-04-20 MED ORDER — BUPIVACAINE HCL (PF) 0.5 % IJ SOLN
INTRAMUSCULAR | Status: AC
  Filled 2013-04-20: qty 30

## 2013-04-20 MED ORDER — 0.9 % SODIUM CHLORIDE (POUR BTL) OPTIME
TOPICAL | Status: DC | PRN
  Administered 2013-04-20: 1000 mL

## 2013-04-20 MED ORDER — SODIUM CHLORIDE 0.9 % IV SOLN
INTRAVENOUS | Status: DC
  Administered 2013-04-20: 1000 mL via INTRAVENOUS

## 2013-04-20 MED ORDER — CLINDAMYCIN PHOSPHATE 600 MG/50ML IV SOLN
INTRAVENOUS | Status: AC
  Filled 2013-04-20: qty 50

## 2013-04-20 MED ORDER — POVIDONE-IODINE 10 % EX SOLN
CUTANEOUS | Status: DC | PRN
  Administered 2013-04-20: 1 via TOPICAL

## 2013-04-20 MED ORDER — LIDOCAINE HCL (PF) 1 % IJ SOLN
INTRAMUSCULAR | Status: DC | PRN
  Administered 2013-04-20: 3.5 mL

## 2013-04-20 SURGICAL SUPPLY — 39 items
BAG HAMPER (MISCELLANEOUS) ×2 IMPLANT
BANDAGE CONFORM 2  STR LF (GAUZE/BANDAGES/DRESSINGS) ×2 IMPLANT
BANDAGE ELASTIC 4 VELCRO NS (GAUZE/BANDAGES/DRESSINGS) ×2 IMPLANT
BANDAGE ESMARK 4X12 BL STRL LF (DISPOSABLE) ×1 IMPLANT
BANDAGE GAUZE ELAST BULKY 4 IN (GAUZE/BANDAGES/DRESSINGS) ×2 IMPLANT
BLADE SURG 15 STRL LF DISP TIS (BLADE) ×2 IMPLANT
BLADE SURG 15 STRL SS (BLADE) ×4
BNDG CMPR 12X4 ELC STRL LF (DISPOSABLE) ×1
BNDG ESMARK 4X12 BLUE STRL LF (DISPOSABLE) ×2
CLOTH BEACON ORANGE TIMEOUT ST (SAFETY) ×2 IMPLANT
COVER LIGHT HANDLE STERIS (MISCELLANEOUS) ×4 IMPLANT
CUFF TOURNIQUET SINGLE 18IN (TOURNIQUET CUFF) ×2 IMPLANT
DECANTER SPIKE VIAL GLASS SM (MISCELLANEOUS) ×3 IMPLANT
DRSG ADAPTIC 3X8 NADH LF (GAUZE/BANDAGES/DRESSINGS) ×2 IMPLANT
ELECT REM PT RETURN 9FT ADLT (ELECTROSURGICAL) ×2
ELECTRODE REM PT RTRN 9FT ADLT (ELECTROSURGICAL) ×1 IMPLANT
GAUZE KERLIX 2  STERILE LF (GAUZE/BANDAGES/DRESSINGS) ×1 IMPLANT
GLOVE BIO SURGEON STRL SZ7.5 (GLOVE) ×2 IMPLANT
GLOVE BIOGEL PI IND STRL 7.0 (GLOVE) IMPLANT
GLOVE BIOGEL PI IND STRL 7.5 (GLOVE) IMPLANT
GLOVE BIOGEL PI INDICATOR 7.0 (GLOVE) ×2
GLOVE BIOGEL PI INDICATOR 7.5 (GLOVE) ×3
GLOVE ECLIPSE 7.0 STRL STRAW (GLOVE) ×3 IMPLANT
GLOVE SS BIOGEL STRL SZ 6.5 (GLOVE) IMPLANT
GLOVE SUPERSENSE BIOGEL SZ 6.5 (GLOVE) ×1
GOWN STRL REUS W/TWL LRG LVL3 (GOWN DISPOSABLE) ×5 IMPLANT
KIT ROOM TURNOVER APOR (KITS) ×2 IMPLANT
MANIFOLD NEPTUNE II (INSTRUMENTS) ×2 IMPLANT
NDL HYPO 27GX1-1/4 (NEEDLE) ×2 IMPLANT
NEEDLE HYPO 27GX1-1/4 (NEEDLE) ×4 IMPLANT
NS IRRIG 1000ML POUR BTL (IV SOLUTION) ×2 IMPLANT
PACK BASIC LIMB (CUSTOM PROCEDURE TRAY) ×2 IMPLANT
PAD ARMBOARD 7.5X6 YLW CONV (MISCELLANEOUS) ×2 IMPLANT
SET BASIN LINEN APH (SET/KITS/TRAYS/PACK) ×2 IMPLANT
SOL PREP PROV IODINE SCRUB 4OZ (MISCELLANEOUS) ×2 IMPLANT
SPONGE GAUZE 4X4 12PLY (GAUZE/BANDAGES/DRESSINGS) ×2 IMPLANT
SPONGE LAP 18X18 X RAY DECT (DISPOSABLE) ×2 IMPLANT
SUT PROLENE 4 0 PS 2 18 (SUTURE) ×2 IMPLANT
SYR CONTROL 10ML LL (SYRINGE) ×4 IMPLANT

## 2013-04-20 NOTE — H&P (Signed)
HISTORY AND PHYSICAL INTERVAL NOTE:  04/20/2013  11:01 AM  [name] [name]  has presented today for surgery, with the diagnosis of hammer toe deformity 2nd left foot.  The various methods of treatment have been discussed with the patient.  No guarantees were given.  After consideration of risks, benefits and other options for treatment, the patient has consented to surgery.  I have reviewed the patients' chart and labs.    Patient Vitals for the past 24 hrs:  BP Temp Temp src Pulse Resp SpO2 Height Weight  04/20/13 1043 115/67 mmHg 97.5 F (36.4 C) Oral 69 22 97 % 5\' 11"  (1.803 m) 275 lb (124.739 kg)    A history and physical examination was performed in my office.  The patient was reexamined.  There have been no changes to this history and physical examination.  Marcheta Grammes, DPM

## 2013-04-20 NOTE — Op Note (Signed)
OPERATIVE NOTE  DATE OF PROCEDURE:  04/20/2013  SURGEON:   Marcheta Grammes, DPM  OR STAFF:   Circulator: Kristopher Oppenheim Protzek, RN; Effie Berkshire, RN Scrub Person: Romero Liner, CST; Sue Lush   PREOPERATIVE DIAGNOSIS:   Hammer toe deformity 2nd digit, left foot.  POSTOPERATIVE DIAGNOSIS: Same  PROCEDURE: Partial amputation of the 2nd digit, left foot.  ANESTHESIA:  Local   HEMOSTASIS:   Pneumatic ankle tourniquet set at 250 mmHg  ESTIMATED BLOOD LOSS:   Minimal (<5 cc)  MATERIALS USED:  None  INJECTABLES: Marcaine 0.5% plain; 53mL  PATHOLOGY:   Distal aspect of the 2nd toe, left foot.  COMPLICATIONS:   None  INDICATIONS:  Painful hammertoe deformity of the left second toe.  DESCRIPTION OF THE PROCEDURE:   The patient was brought to the operating room and placed on the operative table in the supine position.  A pneumatic ankle tourniquet was applied to the patient's ankle.  The surgical site was anesthetized with 0.5% Marcaine plain.  The foot was then prepped, scrubbed, and draped in the usual sterile technique.  The foot was elevated, exsanguinated and the pneumatic ankle tourniquet inflated to 250 mmHg.    Attention was directed to the second toe of the left foot.  2 semi-elliptical incisions were made encompassing the distal aspect of the toes circumferentially.  Dissection was continued deep down to the level of the distal interphalangeal joint.  The joint was disarticulated.  The distal aspect of the toe was passed from the operative field and sent to pathology for evaluation.  The contracture of the second toe was found to be reduced.  The wound was irrigated with copious amounts of sterile irrigant.  The incision was closed with 4-0 Prolene in a simple suture technique.  A sterile compressive dressing was applied to the left foot.  The pneumatic ankle tourniquet was deflated and a prompt hyperemic response was noted to all digits of the left  foot.   The patient tolerated the procedure well.  The patient was then transferred to Short Stay with vital signs stable and vascular status intact to all toes of the operative foot.  Following a period of postoperative monitoring, the patient will be discharged home.

## 2013-04-20 NOTE — Discharge Instructions (Signed)
OK to start coumadin tomorrow night   Toe Injuries and Amputations You have cut off (amputated) part of your toe. Your outcome depends largely on how much was amputated. If just the tip is amputated, often the end of the toe will grow back and the toe may return much to the same as it was before the injury. If more of the toe is missing, your caregiver has done the best with the tissue remaining to allow you to keep as much toe as is possible or has finished the amputation at a level that will leave you with the most functional toe. This means a toe that will work the best for you. Please read the instructions outlined below and refer to this sheet in the next few weeks. These instructions provide you with general information on caring for yourself. Your caregiver may also give you specific instructions. While your treatment has been done according to the most current medical practices available, unavoidable complications occasionally occur. If you have any problems or questions after discharge, call your caregiver. HOME CARE INSTRUCTIONS   You may resume a normal diet and activities as directed or allowed.  Keep your foot elevated when possible. This helps decrease pain and swelling.  Keep ice packs (a bag of ice wrapped in a towel) on the injured area for 15-20 minutes, 03-04 times per day, for the first two days. Use ice only if OK with your caregiver.  Change dressings if necessary or as directed.  Clean the wounded area as directed.  Only take over-the-counter or prescription medicines for pain, discomfort, or fever as directed by your caregiver.  Keep appointments as directed. SEEK IMMEDIATE MEDICAL CARE IF:  There is redness, swelling, numbness or increasing pain in the wound.  There is pus coming from wound.  You have an unexplained oral temperature above 102 F (38.9 C) or as your caregiver suggests.  There is a bad (foul) smell coming from the wound or dressing.  The edges of  the wound break open (the edges are not staying together) after sutures or staples have been removed. Document Released: 02/11/2005 Document Revised: 06/15/2011 Document Reviewed: 07/11/2008 [facility] Patient Information 2014 [place], [place].

## 2013-04-24 ENCOUNTER — Encounter (HOSPITAL_COMMUNITY): Payer: Self-pay | Admitting: Podiatry

## 2013-05-16 ENCOUNTER — Ambulatory Visit (INDEPENDENT_AMBULATORY_CARE_PROVIDER_SITE_OTHER): Payer: Medicare Other | Admitting: *Deleted

## 2013-05-16 DIAGNOSIS — I4891 Unspecified atrial fibrillation: Secondary | ICD-10-CM

## 2013-05-16 DIAGNOSIS — Z7901 Long term (current) use of anticoagulants: Secondary | ICD-10-CM

## 2013-05-16 DIAGNOSIS — Z5181 Encounter for therapeutic drug level monitoring: Secondary | ICD-10-CM | POA: Insufficient documentation

## 2013-05-16 LAB — POCT INR: INR: 2

## 2013-05-25 ENCOUNTER — Ambulatory Visit: Payer: Medicare Other | Admitting: Cardiology

## 2013-06-06 ENCOUNTER — Ambulatory Visit (INDEPENDENT_AMBULATORY_CARE_PROVIDER_SITE_OTHER): Payer: Medicare Other | Admitting: *Deleted

## 2013-06-06 DIAGNOSIS — I4891 Unspecified atrial fibrillation: Secondary | ICD-10-CM

## 2013-06-06 DIAGNOSIS — Z5181 Encounter for therapeutic drug level monitoring: Secondary | ICD-10-CM

## 2013-06-06 DIAGNOSIS — Z7901 Long term (current) use of anticoagulants: Secondary | ICD-10-CM

## 2013-06-06 LAB — POCT INR: INR: 1.9

## 2013-07-04 ENCOUNTER — Ambulatory Visit (INDEPENDENT_AMBULATORY_CARE_PROVIDER_SITE_OTHER): Payer: Medicare Other | Admitting: *Deleted

## 2013-07-04 DIAGNOSIS — Z7901 Long term (current) use of anticoagulants: Secondary | ICD-10-CM

## 2013-07-04 DIAGNOSIS — I4891 Unspecified atrial fibrillation: Secondary | ICD-10-CM

## 2013-07-04 DIAGNOSIS — Z5181 Encounter for therapeutic drug level monitoring: Secondary | ICD-10-CM

## 2013-07-04 LAB — POCT INR: INR: 2.1

## 2013-07-31 ENCOUNTER — Encounter: Payer: Self-pay | Admitting: Cardiology

## 2013-07-31 ENCOUNTER — Ambulatory Visit (INDEPENDENT_AMBULATORY_CARE_PROVIDER_SITE_OTHER): Payer: Medicare Other | Admitting: Cardiology

## 2013-07-31 VITALS — BP 122/65 | HR 73 | Ht 71.0 in | Wt 281.4 lb

## 2013-07-31 DIAGNOSIS — I2510 Atherosclerotic heart disease of native coronary artery without angina pectoris: Secondary | ICD-10-CM

## 2013-07-31 DIAGNOSIS — E782 Mixed hyperlipidemia: Secondary | ICD-10-CM

## 2013-07-31 DIAGNOSIS — I4891 Unspecified atrial fibrillation: Secondary | ICD-10-CM

## 2013-07-31 NOTE — Assessment & Plan Note (Signed)
No angina symptoms with history of nonobstructive disease. 

## 2013-07-31 NOTE — Assessment & Plan Note (Signed)
He continues on Lipitor, followed by Dr. Manuella Ghazi.

## 2013-07-31 NOTE — Progress Notes (Signed)
Clinical Summary [name] is a 79 y.o.male last seen in July 2014. He continues to do well from a cardiac perspective, denies any chest pain or palpitations. Tells me that since I saw him he did have a toe amputation, also UTI. He was off Coumadin intermittently. Last INR was 2.1 on March 31.  Echocardiogram from July 2013 revealed mild to moderate LVH with LVEF greater than 65%, moderate left atrial enlargement, mild mitral regurgitation, trace aortic regurgitation, mildly dilated right ventricle and moderately dilated the right atrium, RVSP 40 mmHg.   Allergies  Allergen Reactions  . Penicillins Hives    Current Outpatient Prescriptions  Medication Sig Dispense Refill  . amLODipine (NORVASC) 5 MG tablet Take 5 mg by mouth every morning.       . Ascorbic Acid (VITAMIN C) 1000 MG tablet Take 1,000 mg by mouth every morning.       Marland Kitchen aspirin 81 MG tablet Take 81 mg by mouth daily.      Marland Kitchen atorvastatin (LIPITOR) 10 MG tablet Take 10 mg by mouth at bedtime.       . calcium carbonate (OS-CAL) 600 MG TABS Take 600 mg by mouth daily.      . cholecalciferol (VITAMIN D) 1000 UNITS tablet Take 1,000 Units by mouth every morning.      . Coenzyme Q10 (CO Q-10) 100 MG CAPS Take 200 mg by mouth daily.       . digoxin (LANOXIN) 0.25 MG tablet Take 250 mcg by mouth every morning. Dosage and instructions not specified      . glimepiride (AMARYL) 4 MG tablet Take 4 mg by mouth 2 (two) times daily.      . hydrochlorothiazide 25 MG tablet Take 25 mg by mouth every morning.       . insulin aspart (NOVOLOG FLEXPEN) 100 UNIT/ML FlexPen Inject 6-20 Units into the skin every evening.      . insulin detemir (LEVEMIR FLEXPEN) 100 UNIT/ML injection Inject 42 Units into the skin daily with breakfast.       . Liraglutide (VICTOZA) 18 MG/3ML SOLN Inject 1.8 mg into the skin every morning. One injection every morning      . lisinopril (PRINIVIL,ZESTRIL) 40 MG tablet Take 40 mg by mouth daily after supper.       Marland Kitchen  LORazepam (ATIVAN) 1 MG tablet Take 2 mg by mouth at bedtime.       . Multiple Vitamin (MULITIVITAMIN WITH MINERALS) TABS Take 1 tablet by mouth every morning.      . Omega-3 Fatty Acids (FISH OIL) 1200 MG CAPS Take 1,200 mg by mouth 2 (two) times daily.       . pioglitazone (ACTOS) 30 MG tablet Take 30 mg by mouth daily.      . vitamin E 400 UNIT capsule Take 400 Units by mouth every morning.       . warfarin (COUMADIN) 5 MG tablet Take 5-7.5 mg by mouth daily. Take 1 tablet every day. Except on Tuesday's and Friday's take 1 & 1/2 tablets       No current facility-administered medications for this visit.    Past Medical History  Diagnosis Date  . Type 2 diabetes mellitus   . IgA nephropathy     Dr. Lowanda Foster  . Atrial fibrillation   . Coronary atherosclerosis of native coronary artery     Nonobstructive  . Hyperlipidemia   . Essential hypertension, benign   . Chronic lymphocytic leukemia     Social  History [name] [name] reports that he quit smoking about 50 years ago. His smoking use included Cigarettes. He smoked 0.00 packs per day for 10 years. He has never used smokeless tobacco. [name] [name] reports that he does not drink alcohol.  Review of Systems Hard of hearing. Otherwise negative except as outlined.  Physical Examination Filed Vitals:   07/31/13 1356  BP: 122/65  Pulse: 73   Filed Weights   07/31/13 1356  Weight: 281 lb 6.4 oz (127.642 kg)    Appears comfortable at rest.  HEENT: Conjunctiva and lids are normal, oropharynx with moist mucosa.  Neck: Supple, no elevated JVP or carotid bruits, no thyromegaly.  Lungs: Clear auscultation, nonlabored.  Cardiac: Irregularly irregular, 2/6 systolic murmur at the base, preserved second heart sound, no rub.  Abdomen: Obese, protuberant, bowel sounds present, nontender.  Skin: Warm and dry.  Musculoskeletal: No kyphosis.  Extremities: Chronic appearing edema, 2+ pitting, symmetrical, distal pulses one plus.  Neuropsychiatric:  Alert and oriented x3, hard of hearing, affect appropriate.     Problem List and Plan   Atrial fibrillation Chronic and symptomatically stable. Continue medical therapy including Coumadin.  CORONARY ATHEROSCLEROSIS NATIVE CORONARY ARTERY No angina symptoms with history of nonobstructive disease.  Mixed hyperlipidemia He continues on Lipitor, followed by Dr. Manuella Ghazi.    Satira Sark, M.D., F.A.C.C.

## 2013-07-31 NOTE — Patient Instructions (Signed)

## 2013-07-31 NOTE — Assessment & Plan Note (Signed)
Chronic and symptomatically stable. Continue medical therapy including Coumadin.

## 2013-08-08 ENCOUNTER — Ambulatory Visit (INDEPENDENT_AMBULATORY_CARE_PROVIDER_SITE_OTHER): Payer: Medicare Other | Admitting: *Deleted

## 2013-08-08 DIAGNOSIS — I4891 Unspecified atrial fibrillation: Secondary | ICD-10-CM

## 2013-08-08 DIAGNOSIS — Z5181 Encounter for therapeutic drug level monitoring: Secondary | ICD-10-CM

## 2013-08-08 DIAGNOSIS — Z7901 Long term (current) use of anticoagulants: Secondary | ICD-10-CM

## 2013-08-08 LAB — POCT INR: INR: 2.2

## 2013-09-19 ENCOUNTER — Ambulatory Visit (INDEPENDENT_AMBULATORY_CARE_PROVIDER_SITE_OTHER): Payer: Medicare Other | Admitting: *Deleted

## 2013-09-19 DIAGNOSIS — Z7901 Long term (current) use of anticoagulants: Secondary | ICD-10-CM

## 2013-09-19 DIAGNOSIS — Z5181 Encounter for therapeutic drug level monitoring: Secondary | ICD-10-CM

## 2013-09-19 DIAGNOSIS — I4891 Unspecified atrial fibrillation: Secondary | ICD-10-CM

## 2013-09-19 LAB — POCT INR: INR: 2.7

## 2013-10-31 ENCOUNTER — Ambulatory Visit (INDEPENDENT_AMBULATORY_CARE_PROVIDER_SITE_OTHER): Payer: Medicare Other | Admitting: *Deleted

## 2013-10-31 DIAGNOSIS — I4891 Unspecified atrial fibrillation: Secondary | ICD-10-CM

## 2013-10-31 DIAGNOSIS — Z5181 Encounter for therapeutic drug level monitoring: Secondary | ICD-10-CM

## 2013-10-31 DIAGNOSIS — Z7901 Long term (current) use of anticoagulants: Secondary | ICD-10-CM

## 2013-10-31 LAB — POCT INR: INR: 3

## 2013-11-28 ENCOUNTER — Ambulatory Visit (INDEPENDENT_AMBULATORY_CARE_PROVIDER_SITE_OTHER): Payer: Medicare Other | Admitting: *Deleted

## 2013-11-28 DIAGNOSIS — Z7901 Long term (current) use of anticoagulants: Secondary | ICD-10-CM

## 2013-11-28 DIAGNOSIS — I4891 Unspecified atrial fibrillation: Secondary | ICD-10-CM

## 2013-11-28 DIAGNOSIS — Z5181 Encounter for therapeutic drug level monitoring: Secondary | ICD-10-CM

## 2013-11-28 LAB — POCT INR: INR: 2.6

## 2014-01-09 ENCOUNTER — Ambulatory Visit (INDEPENDENT_AMBULATORY_CARE_PROVIDER_SITE_OTHER): Payer: Medicare Other | Admitting: Pharmacist Clinician (PhC)/ Clinical Pharmacy Specialist

## 2014-01-09 DIAGNOSIS — I4891 Unspecified atrial fibrillation: Secondary | ICD-10-CM

## 2014-01-09 DIAGNOSIS — Z5181 Encounter for therapeutic drug level monitoring: Secondary | ICD-10-CM

## 2014-01-09 DIAGNOSIS — Z7901 Long term (current) use of anticoagulants: Secondary | ICD-10-CM

## 2014-01-09 LAB — POCT INR: INR: 2

## 2014-01-09 MED ORDER — WARFARIN SODIUM 5 MG PO TABS: ORAL_TABLET | ORAL | Status: DC

## 2014-01-19 ENCOUNTER — Other Ambulatory Visit: Payer: Self-pay

## 2014-02-22 ENCOUNTER — Ambulatory Visit (INDEPENDENT_AMBULATORY_CARE_PROVIDER_SITE_OTHER): Payer: Medicare Other | Admitting: *Deleted

## 2014-02-22 DIAGNOSIS — Z5181 Encounter for therapeutic drug level monitoring: Secondary | ICD-10-CM

## 2014-02-22 DIAGNOSIS — I4891 Unspecified atrial fibrillation: Secondary | ICD-10-CM

## 2014-02-22 DIAGNOSIS — Z7901 Long term (current) use of anticoagulants: Secondary | ICD-10-CM

## 2014-02-22 LAB — POCT INR: INR: 2.5

## 2014-04-05 ENCOUNTER — Ambulatory Visit (INDEPENDENT_AMBULATORY_CARE_PROVIDER_SITE_OTHER): Payer: Medicare Other | Admitting: *Deleted

## 2014-04-05 DIAGNOSIS — Z7901 Long term (current) use of anticoagulants: Secondary | ICD-10-CM

## 2014-04-05 DIAGNOSIS — I4891 Unspecified atrial fibrillation: Secondary | ICD-10-CM

## 2014-04-05 DIAGNOSIS — Z5181 Encounter for therapeutic drug level monitoring: Secondary | ICD-10-CM

## 2014-04-05 LAB — POCT INR: INR: 2.5

## 2014-04-12 ENCOUNTER — Ambulatory Visit (INDEPENDENT_AMBULATORY_CARE_PROVIDER_SITE_OTHER): Payer: Medicare Other | Admitting: Otolaryngology

## 2014-04-12 DIAGNOSIS — H903 Sensorineural hearing loss, bilateral: Secondary | ICD-10-CM

## 2014-05-24 ENCOUNTER — Ambulatory Visit (INDEPENDENT_AMBULATORY_CARE_PROVIDER_SITE_OTHER): Payer: Medicare Other | Admitting: *Deleted

## 2014-05-24 DIAGNOSIS — I4891 Unspecified atrial fibrillation: Secondary | ICD-10-CM

## 2014-05-24 DIAGNOSIS — Z7901 Long term (current) use of anticoagulants: Secondary | ICD-10-CM

## 2014-05-24 DIAGNOSIS — Z5181 Encounter for therapeutic drug level monitoring: Secondary | ICD-10-CM

## 2014-05-24 LAB — POCT INR: INR: 3.2

## 2014-06-05 ENCOUNTER — Telehealth: Payer: Self-pay | Admitting: *Deleted

## 2014-06-05 NOTE — Telephone Encounter (Signed)
Spoke with pt.  He was started on Levaquin and cough syrup with codeine on 2/25.  Told pt to take coumadin 2.5mg  tonight and tomorrow night then take 5mg  daily until INR check 3/10.  He verbalized understanding.

## 2014-06-14 ENCOUNTER — Ambulatory Visit (INDEPENDENT_AMBULATORY_CARE_PROVIDER_SITE_OTHER): Payer: Medicare Other | Admitting: *Deleted

## 2014-06-14 DIAGNOSIS — Z7901 Long term (current) use of anticoagulants: Secondary | ICD-10-CM | POA: Diagnosis not present

## 2014-06-14 DIAGNOSIS — Z5181 Encounter for therapeutic drug level monitoring: Secondary | ICD-10-CM

## 2014-06-14 DIAGNOSIS — I4891 Unspecified atrial fibrillation: Secondary | ICD-10-CM | POA: Diagnosis not present

## 2014-06-14 DIAGNOSIS — I480 Paroxysmal atrial fibrillation: Secondary | ICD-10-CM | POA: Diagnosis not present

## 2014-06-14 LAB — POCT INR: INR: 2.8

## 2014-06-19 ENCOUNTER — Ambulatory Visit (INDEPENDENT_AMBULATORY_CARE_PROVIDER_SITE_OTHER): Payer: Medicare Other | Admitting: Cardiology

## 2014-06-19 ENCOUNTER — Encounter: Payer: Self-pay | Admitting: Cardiology

## 2014-06-19 VITALS — BP 128/80 | HR 90 | Ht 71.0 in | Wt 283.0 lb

## 2014-06-19 DIAGNOSIS — I4891 Unspecified atrial fibrillation: Secondary | ICD-10-CM

## 2014-06-19 DIAGNOSIS — I10 Essential (primary) hypertension: Secondary | ICD-10-CM

## 2014-06-19 DIAGNOSIS — I482 Chronic atrial fibrillation, unspecified: Secondary | ICD-10-CM

## 2014-06-19 DIAGNOSIS — I2510 Atherosclerotic heart disease of native coronary artery without angina pectoris: Secondary | ICD-10-CM

## 2014-06-19 NOTE — Patient Instructions (Signed)
Your physician recommends that you schedule a follow-up appointment in: 6 months. You will receive a reminder letter in the mail in about 4 months reminding you to call and schedule your appointment. If you don't receive this letter, please contact our office. Your physician has recommended you make the following change in your medication:  Stop aspirin. Continue all other medications the same.

## 2014-06-19 NOTE — Progress Notes (Signed)
Cardiology Office Note  Date: 06/19/2014   ID: [name] [name], DOB 08-19-1934, MRN [id number]  PCP: [name], MD  Primary Cardiologist: [name], MD   Chief Complaint  Patient presents with  . Atrial Fibrillation  . Coronary Artery Disease    History of Present Illness: [name] [name] is a 80 y.o. male last seen in April 2015. He presents for a routine visit today. Reports no palpitations or chest pain. States that he had an upper respiratory tract infection back in January and February, thought he might of had "pneumonia." Ultimately he was prescribed antibiotics and cough syrup, his symptoms have resolved.  He continues on Coumadin, recent INR 2.8.  We reviewed his cardiac medications which are outlined below. He has continued on Lanoxin long-term for rate control of his atrial fibrillation, dose is at 0.125 mg daily. No obvious side effects.  He has a history of nonobstructive CAD, no active angina symptoms. I asked him to go ahead and stop aspirin for now since he continues on Coumadin.   Past Medical History  Diagnosis Date  . Type 2 diabetes mellitus   . IgA nephropathy     Dr. Lowanda Foster  . Atrial fibrillation   . Coronary atherosclerosis of native coronary artery     Nonobstructive  . Hyperlipidemia   . Essential hypertension, benign   . Chronic lymphocytic leukemia     Past Surgical History  Procedure Laterality Date  . Right total knee replacement    . Open repair left quadriceps tendon.  08/22/07    Dr. Aline Brochure  . Transurethral resection of prostate    . Anal fissure repair    . Femoral hernia repair    . Colonoscopy  08/19/2011    Procedure: COLONOSCOPY;  Surgeon: Rogene Houston, MD;  [facility];  Service: Endoscopy;  Laterality: N/A;  930  . Amputation Left 09/15/2012    Procedure: PARTIAL AMPUTATION 3rd TOE LEFT FOOT;  Surgeon: Marcheta Grammes, DPM;  Location: [facility];  Service: Orthopedics;  Laterality: Left;  . Amputation  Left 04/20/2013    Procedure: PARTIAL AMPUTATION 2ND TOE LEFT FOOT;  Surgeon: Marcheta Grammes, DPM;  Location: [facility];  Service: Orthopedics;  Laterality: Left;    Current Outpatient Prescriptions  Medication Sig Dispense Refill  . amLODipine (NORVASC) 5 MG tablet Take 5 mg by mouth every morning.     . Ascorbic Acid (VITAMIN C) 1000 MG tablet Take 1,000 mg by mouth every morning.     Marland Kitchen atorvastatin (LIPITOR) 10 MG tablet Take 10 mg by mouth at bedtime.     . calcium carbonate (OS-CAL) 600 MG TABS Take 600 mg by mouth daily.    . cholecalciferol (VITAMIN D) 1000 UNITS tablet Take 1,000 Units by mouth every morning.    . Coenzyme Q10 (CO Q-10) 100 MG CAPS Take 200 mg by mouth daily.     . digoxin (LANOXIN) 0.25 MG tablet Take 0.125 mcg by mouth every morning. Dosage and instructions not specified    . glimepiride (AMARYL) 4 MG tablet Take 4 mg by mouth 2 (two) times daily.    . hydrochlorothiazide 25 MG tablet Take 25 mg by mouth every morning.     . insulin aspart (NOVOLOG FLEXPEN) 100 UNIT/ML FlexPen Inject 6-20 Units into the skin every evening.    . insulin detemir (LEVEMIR FLEXPEN) 100 UNIT/ML injection Inject 42 Units into the skin daily with breakfast.     . Liraglutide (VICTOZA) 18 MG/3ML  SOLN Inject 1.8 mg into the skin every morning. One injection every morning    . lisinopril (PRINIVIL,ZESTRIL) 40 MG tablet Take 40 mg by mouth daily after supper.     Marland Kitchen LORazepam (ATIVAN) 1 MG tablet Take 2 mg by mouth at bedtime.     . Multiple Vitamin (MULITIVITAMIN WITH MINERALS) TABS Take 1 tablet by mouth every morning.    . Omega-3 Fatty Acids (FISH OIL) 1200 MG CAPS Take 1,200 mg by mouth 2 (two) times daily.     . pioglitazone (ACTOS) 30 MG tablet Take 30 mg by mouth daily.    . vitamin E 400 UNIT capsule Take 400 Units by mouth every morning.     . warfarin (COUMADIN) 5 MG tablet Take 1 to 1.5 tablets by mouth daily as directed by coumadin clinic 105 tablet 2   No current  facility-administered medications for this visit.    Allergies:  Penicillins   Social History: The patient  reports that he quit smoking about 51 years ago. His smoking use included Cigarettes. He quit after 10 years of use. He has never used smokeless tobacco. He reports that he does not drink alcohol or use illicit drugs.   ROS:  Please see the history of present illness. Otherwise, complete review of systems is positive for none.  All other systems are reviewed and negative.    Physical Exam: VS:  BP 128/80 mmHg  Pulse 90  Ht 5\' 11"  (1.803 m)  Wt 283 lb (128.368 kg)  BMI 39.49 kg/m2  SpO2 97%, BMI Body mass index is 39.49 kg/(m^2).  Wt Readings from Last 3 Encounters:  06/19/14 283 lb (128.368 kg)  07/31/13 281 lb 6.4 oz (127.642 kg)  04/20/13 275 lb (124.739 kg)     Appears comfortable at rest.  HEENT: Conjunctiva and lids are normal, oropharynx with moist mucosa.  Neck: Supple, no elevated JVP or carotid bruits, no thyromegaly.  Lungs: Clear auscultation, nonlabored.  Cardiac: Irregularly irregular, 2/6 systolic murmur at the base, preserved second heart sound, no rub.  Abdomen: Obese, protuberant, bowel sounds present, nontender.  Skin: Warm and dry.  Musculoskeletal: No kyphosis.  Extremities: Chronic appearing edema, 2+ pitting, symmetrical, distal pulses one plus.  Neuropsychiatric: Alert and oriented x3, hard of hearing, affect appropriate.    ECG: ECG is ordered today and reviewed showing rate-controlled atrial fibrillation with right bundle branch block and left anterior fascicular block.   Other Studies Reviewed Today:  Echocardiogram from July 2013 revealed mild to moderate LVH with LVEF greater than 65%, moderate left atrial enlargement, mild mitral regurgitation, trace aortic regurgitation, mildly dilated right ventricle and moderately dilated right atrium, RVSP 40 mmHg.  Assessment and Plan:  1. Chronic atrial fibrillation. Continue strategy of  heart rate control and anticoagulation.  2. History of nonobstructive CAD without active angina symptoms. He continues on statin therapy. We are dropping aspirin from his medical regimen.  3. Essential hypertension, good blood pressure control today.  Current medicines are reviewed at length with the patient today.  The patient does not have concerns regarding medicines.   Orders Placed This Encounter  Procedures  . EKG 12-Lead    Disposition: FU with me in 6 months.   Signed, Satira Sark, MD, [facility] 06/19/2014 1:32 PM    [facility] [address] [address], [place], [place] [address] Phone: [phone]; Fax: [phone]

## 2014-07-12 ENCOUNTER — Ambulatory Visit (INDEPENDENT_AMBULATORY_CARE_PROVIDER_SITE_OTHER): Payer: Medicare Other | Admitting: *Deleted

## 2014-07-12 DIAGNOSIS — I4891 Unspecified atrial fibrillation: Secondary | ICD-10-CM | POA: Diagnosis not present

## 2014-07-12 DIAGNOSIS — Z5181 Encounter for therapeutic drug level monitoring: Secondary | ICD-10-CM | POA: Diagnosis not present

## 2014-07-12 DIAGNOSIS — Z7901 Long term (current) use of anticoagulants: Secondary | ICD-10-CM

## 2014-07-12 LAB — POCT INR: INR: 2.5

## 2014-08-02 ENCOUNTER — Other Ambulatory Visit (INDEPENDENT_AMBULATORY_CARE_PROVIDER_SITE_OTHER): Payer: Self-pay | Admitting: *Deleted

## 2014-08-02 ENCOUNTER — Telehealth (INDEPENDENT_AMBULATORY_CARE_PROVIDER_SITE_OTHER): Payer: Self-pay | Admitting: *Deleted

## 2014-08-02 ENCOUNTER — Encounter (INDEPENDENT_AMBULATORY_CARE_PROVIDER_SITE_OTHER): Payer: Self-pay | Admitting: *Deleted

## 2014-08-02 DIAGNOSIS — Z800 Family history of malignant neoplasm of digestive organs: Secondary | ICD-10-CM

## 2014-08-02 DIAGNOSIS — Z86010 Personal history of colonic polyps: Secondary | ICD-10-CM

## 2014-08-02 NOTE — Telephone Encounter (Signed)
This encounter was created in error - please disregard.

## 2014-08-02 NOTE — Telephone Encounter (Signed)
[name] [name] is scheduled for colonoscopy 09/19/14 and needs to stop Coumadin 5 days prior, is this ok, please advise. thanks

## 2014-08-07 NOTE — Telephone Encounter (Signed)
OK to hold coumadin 5 days before procedure and resume night of procedure if OK with Dr Laural Golden.

## 2014-08-08 ENCOUNTER — Telehealth (INDEPENDENT_AMBULATORY_CARE_PROVIDER_SITE_OTHER): Payer: Self-pay | Admitting: *Deleted

## 2014-08-08 DIAGNOSIS — Z1211 Encounter for screening for malignant neoplasm of colon: Secondary | ICD-10-CM

## 2014-08-08 NOTE — Telephone Encounter (Signed)
Patient needs trilyte 

## 2014-08-09 ENCOUNTER — Ambulatory Visit (INDEPENDENT_AMBULATORY_CARE_PROVIDER_SITE_OTHER): Payer: Medicare Other | Admitting: *Deleted

## 2014-08-09 DIAGNOSIS — Z7901 Long term (current) use of anticoagulants: Secondary | ICD-10-CM | POA: Diagnosis not present

## 2014-08-09 DIAGNOSIS — Z5181 Encounter for therapeutic drug level monitoring: Secondary | ICD-10-CM | POA: Diagnosis not present

## 2014-08-09 DIAGNOSIS — I4891 Unspecified atrial fibrillation: Secondary | ICD-10-CM | POA: Diagnosis not present

## 2014-08-09 LAB — POCT INR: INR: 2.5

## 2014-08-16 MED ORDER — PEG 3350-KCL-NA BICARB-NACL 420 G PO SOLR: 4000.0000 mL | Freq: Once | ORAL | Status: DC

## 2014-08-21 ENCOUNTER — Telehealth (INDEPENDENT_AMBULATORY_CARE_PROVIDER_SITE_OTHER): Payer: Self-pay | Admitting: *Deleted

## 2014-08-21 NOTE — Telephone Encounter (Signed)
Referring MD/PCP: shah   Procedure: tcs  Reason/Indication:  Hx polyps, fam hx colon ca  Has patient had this procedure before?  Yes, 2013 -- epic  If so, when, by whom and where?    Is there a family history of colon cancer?  Yes, parents  Who?  What age when diagnosed?    Is patient diabetic?   yes      Does patient have prosthetic heart valve?  no  Do you have a pacemaker?  no  Has patient ever had endocarditis? no  Has patient had joint replacement within last 12 months?  no  Does patient tend to be constipated or take laxatives? no  Is patient on Coumadin, Plavix and/or Aspirin? yes  Medications: coumadin 5 mg daily, actos 30 mg daily, victoza 1.8 mg daily, levemir 42 units daily, amaryl 4 mg bid, novolog sliding scale, lisinopril 40 mg daily, Lipitor 10 mg daily, digoxin  0.125 mg daily, hctz 25 mg daily, co q 10 200 mg daily, fish oil 1200 mg 2 tab bid, vit e 400 iu daily, vit d3 1000 iu daily, multi vit daily, calcium + d 600 mg daily, vit c 1000 mg daily  Allergies: pcn  Medication Adjustment: coumadin 5 days, hold actos, victoza & amaryl evening before & morning of, decrease levemir to 20 units days before & decrease novolog to 1/2 normal dose  Procedure date & time: 09/19/14 at 1055

## 2014-08-22 NOTE — Telephone Encounter (Signed)
agree

## 2014-09-19 ENCOUNTER — Ambulatory Visit (HOSPITAL_COMMUNITY)
Admission: RE | Admit: 2014-09-19 | Discharge: 2014-09-19 | Disposition: A | Discharge status: Home / Self Care | Payer: Medicare Other | Source: Ambulatory Visit | Attending: Internal Medicine | Admitting: Internal Medicine

## 2014-09-19 ENCOUNTER — Encounter (HOSPITAL_COMMUNITY)
Admission: RE | Disposition: A | Discharge status: Home / Self Care | Payer: Self-pay | Source: Ambulatory Visit | Attending: Internal Medicine

## 2014-09-19 DIAGNOSIS — D123 Benign neoplasm of transverse colon: Secondary | ICD-10-CM

## 2014-09-19 DIAGNOSIS — D122 Benign neoplasm of ascending colon: Secondary | ICD-10-CM | POA: Diagnosis not present

## 2014-09-19 DIAGNOSIS — K648 Other hemorrhoids: Secondary | ICD-10-CM

## 2014-09-19 DIAGNOSIS — I2510 Atherosclerotic heart disease of native coronary artery without angina pectoris: Secondary | ICD-10-CM | POA: Insufficient documentation

## 2014-09-19 DIAGNOSIS — I10 Essential (primary) hypertension: Secondary | ICD-10-CM | POA: Diagnosis not present

## 2014-09-19 DIAGNOSIS — Z86010 Personal history of colonic polyps: Secondary | ICD-10-CM | POA: Insufficient documentation

## 2014-09-19 DIAGNOSIS — D124 Benign neoplasm of descending colon: Secondary | ICD-10-CM | POA: Diagnosis not present

## 2014-09-19 DIAGNOSIS — Z880 Allergy status to penicillin: Secondary | ICD-10-CM | POA: Insufficient documentation

## 2014-09-19 DIAGNOSIS — E119 Type 2 diabetes mellitus without complications: Secondary | ICD-10-CM | POA: Diagnosis not present

## 2014-09-19 DIAGNOSIS — Z1211 Encounter for screening for malignant neoplasm of colon: Secondary | ICD-10-CM | POA: Insufficient documentation

## 2014-09-19 DIAGNOSIS — Z800 Family history of malignant neoplasm of digestive organs: Secondary | ICD-10-CM | POA: Insufficient documentation

## 2014-09-19 DIAGNOSIS — E785 Hyperlipidemia, unspecified: Secondary | ICD-10-CM | POA: Diagnosis not present

## 2014-09-19 DIAGNOSIS — Z794 Long term (current) use of insulin: Secondary | ICD-10-CM | POA: Diagnosis not present

## 2014-09-19 DIAGNOSIS — K649 Unspecified hemorrhoids: Secondary | ICD-10-CM | POA: Diagnosis not present

## 2014-09-19 DIAGNOSIS — Z87891 Personal history of nicotine dependence: Secondary | ICD-10-CM | POA: Diagnosis not present

## 2014-09-19 DIAGNOSIS — I4891 Unspecified atrial fibrillation: Secondary | ICD-10-CM | POA: Diagnosis not present

## 2014-09-19 DIAGNOSIS — Z79899 Other long term (current) drug therapy: Secondary | ICD-10-CM | POA: Diagnosis not present

## 2014-09-19 DIAGNOSIS — K644 Residual hemorrhoidal skin tags: Secondary | ICD-10-CM | POA: Insufficient documentation

## 2014-09-19 DIAGNOSIS — R195 Other fecal abnormalities: Secondary | ICD-10-CM

## 2014-09-19 DIAGNOSIS — D120 Benign neoplasm of cecum: Secondary | ICD-10-CM

## 2014-09-19 HISTORY — PX: COLONOSCOPY: SHX5424

## 2014-09-19 LAB — CLOSTRIDIUM DIFFICILE BY PCR: CDIFFPCR: NEGATIVE

## 2014-09-19 LAB — GLUCOSE, CAPILLARY: GLUCOSE-CAPILLARY: 169 mg/dL — AB (ref 65–99)

## 2014-09-19 SURGERY — COLONOSCOPY
Anesthesia: Moderate Sedation

## 2014-09-19 MED ORDER — MEPERIDINE HCL 50 MG/ML IJ SOLN
INTRAMUSCULAR | Status: AC
  Filled 2014-09-19: qty 1

## 2014-09-19 MED ORDER — SODIUM CHLORIDE 0.9 % IV SOLN
INTRAVENOUS | Status: DC
  Administered 2014-09-19: 1000 mL via INTRAVENOUS

## 2014-09-19 MED ORDER — MIDAZOLAM HCL 5 MG/5ML IJ SOLN
INTRAMUSCULAR | Status: DC | PRN
  Administered 2014-09-19: 2 mg via INTRAVENOUS
  Administered 2014-09-19: 1 mg via INTRAVENOUS
  Administered 2014-09-19: 2 mg via INTRAVENOUS

## 2014-09-19 MED ORDER — MIDAZOLAM HCL 5 MG/5ML IJ SOLN
INTRAMUSCULAR | Status: AC
  Filled 2014-09-19: qty 10

## 2014-09-19 MED ORDER — STERILE WATER FOR IRRIGATION IR SOLN
Status: DC | PRN
  Administered 2014-09-19: 11:00:00

## 2014-09-19 MED ORDER — MEPERIDINE HCL 50 MG/ML IJ SOLN
INTRAMUSCULAR | Status: DC | PRN
  Administered 2014-09-19 (×2): 25 mg via INTRAVENOUS

## 2014-09-19 NOTE — H&P (Signed)
[name] [name] is an 80 y.o. male.   Chief Complaint: Patient is here for colonoscopy. HPI: A she is 80 year old Caucasian male who has history of multiple colonic adenomas was here for surveillance colonoscopy. His last exam was in May 2013 with removal of 11 polyps. Most of the polyps are tubular adenomas and one was sessile serrated polyp. He denies abdominal pain change in his bowel habits. Family history significant for CRC and father who was ports 49 time of diagnosis and died at 57. Mother had segment of her colon removed for large colonic polyp.  Past Medical History  Diagnosis Date  . Type 2 diabetes mellitus   . IgA nephropathy     Dr. Lowanda Foster  . Atrial fibrillation   . Coronary atherosclerosis of native coronary artery     Nonobstructive  . Hyperlipidemia   . Essential hypertension, benign   . Chronic lymphocytic leukemia     Past Surgical History  Procedure Laterality Date  . Right total knee replacement    . Open repair left quadriceps tendon.  08/22/07    Dr. Aline Brochure  . Transurethral resection of prostate    . Anal fissure repair    . Femoral hernia repair    . Colonoscopy  08/19/2011    Procedure: COLONOSCOPY;  Surgeon: Rogene Houston, MD;  [facility];  Service: Endoscopy;  Laterality: N/A;  930  . Amputation Left 09/15/2012    Procedure: PARTIAL AMPUTATION 3rd TOE LEFT FOOT;  Surgeon: Marcheta Grammes, DPM;  Location: [facility];  Service: Orthopedics;  Laterality: Left;  . Amputation Left 04/20/2013    Procedure: PARTIAL AMPUTATION 2ND TOE LEFT FOOT;  Surgeon: Marcheta Grammes, DPM;  Location: [facility];  Service: Orthopedics;  Laterality: Left;    Family History  Problem Relation Age of Onset  . Atrial fibrillation Mother    Social History:  reports that he quit smoking about 51 years ago. His smoking use included Cigarettes. He quit after 10 years of use. He has never used smokeless tobacco. He reports that he does not drink alcohol or use  illicit drugs.  Allergies:  Allergies  Allergen Reactions  . Penicillins Hives    Medications Prior to Admission  Medication Sig Dispense Refill  . amLODipine (NORVASC) 5 MG tablet Take 5 mg by mouth every morning.     . Ascorbic Acid (VITAMIN C) 1000 MG tablet Take 1,000 mg by mouth every morning.     Marland Kitchen atorvastatin (LIPITOR) 10 MG tablet Take 10 mg by mouth at bedtime.     . calcium carbonate (OS-CAL) 600 MG TABS Take 600 mg by mouth daily.    . cholecalciferol (VITAMIN D) 1000 UNITS tablet Take 1,000 Units by mouth every morning.    . Coenzyme Q10 (CO Q-10) 100 MG CAPS Take 200 mg by mouth daily.     . digoxin (LANOXIN) 0.25 MG tablet Take 0.125 mcg by mouth every morning. Dosage and instructions not specified    . glimepiride (AMARYL) 4 MG tablet Take 4 mg by mouth 2 (two) times daily.    . hydrochlorothiazide 25 MG tablet Take 25 mg by mouth every morning.     . insulin aspart (NOVOLOG FLEXPEN) 100 UNIT/ML FlexPen Inject 6-20 Units into the skin every evening.    . insulin detemir (LEVEMIR FLEXPEN) 100 UNIT/ML injection Inject 42 Units into the skin daily with breakfast.     . Liraglutide (VICTOZA) 18 MG/3ML SOLN Inject 1.8 mg into the skin every  morning. One injection every morning    . lisinopril (PRINIVIL,ZESTRIL) 40 MG tablet Take 40 mg by mouth daily after supper.     Marland Kitchen LORazepam (ATIVAN) 1 MG tablet Take 2 mg by mouth at bedtime.     . Multiple Vitamin (MULITIVITAMIN WITH MINERALS) TABS Take 1 tablet by mouth every morning.    . Omega-3 Fatty Acids (FISH OIL) 1200 MG CAPS Take 1,200 mg by mouth 2 (two) times daily.     . pioglitazone (ACTOS) 30 MG tablet Take 30 mg by mouth daily.    . polyethylene glycol-electrolytes (NULYTELY/GOLYTELY) 420 G solution Take 4,000 mLs by mouth once. 4000 mL 0  . vitamin E 400 UNIT capsule Take 400 Units by mouth every morning.     . warfarin (COUMADIN) 5 MG tablet Take 1 to 1.5 tablets by mouth daily as directed by coumadin clinic 105 tablet 2     Results for orders placed or performed during the hospital encounter of 09/19/14 (from the past 48 hour(s))  Glucose, capillary     Status: Abnormal   Collection Time: 09/19/14 10:00 AM  Result Value Ref Range   Glucose-Capillary 169 (H) 65 - 99 mg/dL   No results found.  ROS  Blood pressure 124/58, pulse 78, temperature 97.6 F (36.4 C), temperature source Oral, resp. rate 13, SpO2 95 %. Physical Exam  Constitutional: He appears well-developed and well-nourished.  HENT:  Mouth/Throat: Oropharynx is clear and moist.  Eyes: Conjunctivae are normal. No scleral icterus.  Neck: No thyromegaly present.  Cardiovascular:  Irregular rhythm normal S1 and S2. Grade 2/6 systolic ejection murmur best heard at left sternal border.  Respiratory: Effort normal and breath sounds normal.  GI:  Umbilicus hernia about the size of golf ball. Abdomen is soft and nontender without organomegaly or masses.  Musculoskeletal: He exhibits edema (trace edema around ankles).  Lymphadenopathy:    He has no cervical adenopathy.  Neurological: He is alert.  Skin: Skin is warm.     Assessment/Plan History of colonic adenomas. Family history of CRC in a first-degree relative(father at age 105 at the time of diagnosis).  REHMAN,NAJEEB U 09/19/2014, 11:00 AM

## 2014-09-19 NOTE — Discharge Instructions (Signed)
Resume warfarin starting this evening at usual dose and get INR checked in 7-10 days. Resume other medications and diet as before. No driving for 24 hours. Patient will call with results of biopsy and stool test.  Colonoscopy, Care After These instructions give you information on caring for yourself after your procedure. Your doctor may also give you more specific instructions. Call your doctor if you have any problems or questions after your procedure. HOME CARE  Do not drive for 24 hours.  Do not sign important papers or use machinery for 24 hours.  You may shower.  You may go back to your usual activities, but go slower for the first 24 hours.  Take rest breaks often during the first 24 hours.  Walk around or use warm packs on your belly (abdomen) if you have belly cramping or gas.  Drink enough fluids to keep your pee (urine) clear or pale yellow.  Resume your normal diet. Avoid heavy or fried foods.  Avoid drinking alcohol for 24 hours or as told by your doctor.  Only take medicines as told by your doctor. If a tissue sample (biopsy) was taken during the procedure:   Do not take aspirin or blood thinners for 7 days, or as told by your doctor.  Do not drink alcohol for 7 days, or as told by your doctor.  Eat soft foods for the first 24 hours. GET HELP IF: You still have a small amount of blood in your poop (stool) 2-3 days after the procedure. GET HELP RIGHT AWAY IF:  You have more than a small amount of blood in your poop.  You see clumps of tissue (blood clots) in your poop.  Your belly is puffy (swollen).  You feel sick to your stomach (nauseous) or throw up (vomit).  You have a fever.  You have belly pain that gets worse and medicine does not help. MAKE SURE YOU:  Understand these instructions.  Will watch your condition.  Will get help right away if you are not doing well or get worse. Document Released: 04/25/2010 Document Revised: 03/28/2013  Document Reviewed: 11/28/2012 [facility] Patient Information 2015 [place], [place]. This information is not intended to replace advice given to you by your health care provider. Make sure you discuss any questions you have with your health care provider.  Colon Polyps Polyps are lumps of extra tissue growing inside the body. Polyps can grow in the large intestine (colon). Most colon polyps are noncancerous (benign). However, some colon polyps can become cancerous over time. Polyps that are larger than a pea may be harmful. To be safe, caregivers remove and test all polyps. CAUSES  Polyps form when mutations in the genes cause your cells to grow and divide even though no more tissue is needed. RISK FACTORS There are a number of risk factors that can increase your chances of getting colon polyps. They include:  Being older than 50 years.  Family history of colon polyps or colon cancer.  Long-term colon diseases, such as colitis or Crohn disease.  Being overweight.  Smoking.  Being inactive.  Drinking too much alcohol. SYMPTOMS  Most small polyps do not cause symptoms. If symptoms are present, they may include:  Blood in the stool. The stool may look dark red or black.  Constipation or diarrhea that lasts longer than 1 week. DIAGNOSIS People often do not know they have polyps until their caregiver finds them during a regular checkup. Your caregiver can use 4 tests to check for  polyps:  Digital rectal exam. The caregiver wears gloves and feels inside the rectum. This test would find polyps only in the rectum.  Barium enema. The caregiver puts a liquid called barium into your rectum before taking X-rays of your colon. Barium makes your colon look white. Polyps are dark, so they are easy to see in the X-ray pictures.  Sigmoidoscopy. A thin, flexible tube (sigmoidoscope) is placed into your rectum. The sigmoidoscope has a light and tiny camera in it. The caregiver uses the sigmoidoscope to  look at the last third of your colon.  Colonoscopy. This test is like sigmoidoscopy, but the caregiver looks at the entire colon. This is the most common method for finding and removing polyps. TREATMENT  Any polyps will be removed during a sigmoidoscopy or colonoscopy. The polyps are then tested for cancer. PREVENTION  To help lower your risk of getting more colon polyps:  Eat plenty of fruits and vegetables. Avoid eating fatty foods.  Do not smoke.  Avoid drinking alcohol.  Exercise every day.  Lose weight if recommended by your caregiver.  Eat plenty of calcium and folate. Foods that are rich in calcium include milk, cheese, and broccoli. Foods that are rich in folate include chickpeas, kidney beans, and spinach. HOME CARE INSTRUCTIONS Keep all follow-up appointments as directed by your caregiver. You may need periodic exams to check for polyps. SEEK MEDICAL CARE IF: You notice bleeding during a bowel movement. Document Released: 12/18/2003 Document Revised: 06/15/2011 Document Reviewed: 06/02/2011 [facility] Patient Information 2015 [place], [place]. This information is not intended to replace advice given to you by your health care provider. Make sure you discuss any questions you have with your health care provider.

## 2014-09-19 NOTE — Op Note (Addendum)
COLONOSCOPY PROCEDURE REPORT  PATIENT:  [name] [name]  MR#:  [id number] Birthdate:  19-Aug-1934, 80 y.o., male Endoscopist:  Dr. Rogene Houston, MD Referred By:  Dr. [name], MD  Procedure Date: 09/19/2014  Procedure:   Colonoscopy  Indications:  Patient is 80 year old Caucasian male who has history of colonic adenomas and is returning for surveillance colonoscopy. Family history of CRC in father. His last colonoscopy was in May 2013 with removal of 11 polyps most of which were adenomatous.   Informed Consent:  The procedure and risks were reviewed with the patient and informed consent was obtained.  Medications:  Demerol 50 mg IV Versed 5 mg IV  Description of procedure:  After a digital rectal exam was performed, that colonoscope was advanced from the anus through the rectum and colon to the area of the cecum, ileocecal valve and appendiceal orifice. The cecum was deeply intubated. These structures were well-seen and photographed for the record. From the level of the cecum and ileocecal valve, the scope was slowly and cautiously withdrawn. The mucosal surfaces were carefully surveyed utilizing scope tip to flexion to facilitate fold flattening as needed. The scope was pulled down into the rectum where a thorough exam including retroflexion was performed.  Findings:   Prep satisfactory. He had a lot of liquid stool. Sample was aspirated and sent to the lab for C. difficile by PCR. Patchy fine nodularity noted to mucosa without erythema or friability. 5 small polyps were cold snared and submitted together. These are located at cecum, ascending colon, hepatic flexure and descending colon. Normal rectal mucosa. Small hemorrhoids below the dentate line.   Therapeutic/Diagnostic Maneuvers Performed:  See above  Complications:  None  Cecal Withdrawal Time:  20 minutes  Impression:  Examination performed to cecum. Patchy fine nodularity noted to colonic mucosa consistent with  lymphoid hyperplasia without changes of colitis. Stool sample sent to the left for C. difficile by PCR. 5 small polyps were cold snared and submitted together(2 at cecum, one each at ascending colon, hepatic flexure and descending colon0. Small external hemorrhoids.  Comment; Patchy lymphoid hyperplasia may be marker for early or resolving C. difficile colitis. Resume he was on amoxicillin for UTI for 3 weeks.  Recommendations:  Standard instructions given. Patient will resume warfarin this evening at usual dose and get INR checked in 7-10 days. I will contact patient with biopsy results and further recommendations.  Joycelyn Liska U  09/19/2014 11:58 AM  CC: Dr. [name], MD & Dr. Rayne Du ref. provider found

## 2014-09-21 ENCOUNTER — Encounter (HOSPITAL_COMMUNITY): Payer: Self-pay | Admitting: Internal Medicine

## 2014-09-27 ENCOUNTER — Ambulatory Visit (INDEPENDENT_AMBULATORY_CARE_PROVIDER_SITE_OTHER): Payer: Medicare Other | Admitting: *Deleted

## 2014-09-27 DIAGNOSIS — Z7901 Long term (current) use of anticoagulants: Secondary | ICD-10-CM

## 2014-09-27 DIAGNOSIS — I4891 Unspecified atrial fibrillation: Secondary | ICD-10-CM

## 2014-09-27 DIAGNOSIS — Z5181 Encounter for therapeutic drug level monitoring: Secondary | ICD-10-CM | POA: Diagnosis not present

## 2014-09-27 LAB — POCT INR: INR: 2

## 2014-09-28 ENCOUNTER — Encounter (INDEPENDENT_AMBULATORY_CARE_PROVIDER_SITE_OTHER): Payer: Self-pay | Admitting: *Deleted

## 2014-10-01 ENCOUNTER — Other Ambulatory Visit: Payer: Self-pay

## 2014-11-08 ENCOUNTER — Ambulatory Visit (INDEPENDENT_AMBULATORY_CARE_PROVIDER_SITE_OTHER): Payer: Medicare Other | Admitting: *Deleted

## 2014-11-08 DIAGNOSIS — I4891 Unspecified atrial fibrillation: Secondary | ICD-10-CM | POA: Diagnosis not present

## 2014-11-08 DIAGNOSIS — Z7901 Long term (current) use of anticoagulants: Secondary | ICD-10-CM | POA: Diagnosis not present

## 2014-11-08 DIAGNOSIS — Z5181 Encounter for therapeutic drug level monitoring: Secondary | ICD-10-CM | POA: Diagnosis not present

## 2014-11-08 LAB — POCT INR: INR: 2.9

## 2014-12-20 ENCOUNTER — Ambulatory Visit (INDEPENDENT_AMBULATORY_CARE_PROVIDER_SITE_OTHER): Payer: Medicare Other | Admitting: Otolaryngology

## 2014-12-20 DIAGNOSIS — J310 Chronic rhinitis: Secondary | ICD-10-CM | POA: Diagnosis not present

## 2014-12-20 DIAGNOSIS — J343 Hypertrophy of nasal turbinates: Secondary | ICD-10-CM | POA: Diagnosis not present

## 2014-12-20 DIAGNOSIS — J342 Deviated nasal septum: Secondary | ICD-10-CM | POA: Diagnosis not present

## 2014-12-20 DIAGNOSIS — R0982 Postnasal drip: Secondary | ICD-10-CM

## 2014-12-25 ENCOUNTER — Ambulatory Visit (INDEPENDENT_AMBULATORY_CARE_PROVIDER_SITE_OTHER): Payer: Medicare Other | Admitting: *Deleted

## 2014-12-25 DIAGNOSIS — I4891 Unspecified atrial fibrillation: Secondary | ICD-10-CM | POA: Diagnosis not present

## 2014-12-25 DIAGNOSIS — Z5181 Encounter for therapeutic drug level monitoring: Secondary | ICD-10-CM

## 2014-12-25 DIAGNOSIS — Z7901 Long term (current) use of anticoagulants: Secondary | ICD-10-CM | POA: Diagnosis not present

## 2014-12-25 LAB — POCT INR: INR: 2.9

## 2014-12-25 MED ORDER — WARFARIN SODIUM 5 MG PO TABS: ORAL_TABLET | ORAL | Status: DC

## 2015-01-03 ENCOUNTER — Encounter: Payer: Self-pay | Admitting: Cardiology

## 2015-01-03 ENCOUNTER — Ambulatory Visit (INDEPENDENT_AMBULATORY_CARE_PROVIDER_SITE_OTHER): Payer: Medicare Other | Admitting: Cardiology

## 2015-01-03 VITALS — BP 117/70 | HR 83 | Ht 71.0 in | Wt 292.1 lb

## 2015-01-03 DIAGNOSIS — I2510 Atherosclerotic heart disease of native coronary artery without angina pectoris: Secondary | ICD-10-CM | POA: Diagnosis not present

## 2015-01-03 DIAGNOSIS — I482 Chronic atrial fibrillation, unspecified: Secondary | ICD-10-CM

## 2015-01-03 NOTE — Patient Instructions (Signed)
Your physician recommends that you continue on your current medications as directed. Please refer to the Current Medication list given to you today. Your physician recommends that you schedule a follow-up appointment in: 6 months. You will receive a reminder letter in the mail in about 4 months reminding you to call and schedule your appointment. If you don't receive this letter, please contact our office. 

## 2015-01-03 NOTE — Progress Notes (Signed)
Cardiology Office Note  Date: 01/03/2015   ID: [name] [name], DOB 06-19-1934, MRN [id number]  PCP: [name], MD  Primary Cardiologist: [name], MD   Chief Complaint  Patient presents with  . Atrial Fibrillation    History of Present Illness: [name] [name] is a 80 y.o. male last seen in March. He presents for a routine follow-up visit. Since last encounter a does not report any progressing since of palpitations or chest pain. He continues on Coumadin and digoxin which he has been on long-term for management of atrial fibrillation. He has had no obvious side effects or problems with this combination.  He continues to follow with Dr. Manuella Ghazi. He will be having lab work later this year. He denies any significant bleeding problems. Recent INR was 2.9 on September 20.   Past Medical History  Diagnosis Date  . Type 2 diabetes mellitus   . IgA nephropathy     Dr. Lowanda Foster  . Atrial fibrillation   . Coronary atherosclerosis of native coronary artery     Nonobstructive  . Hyperlipidemia   . Essential hypertension, benign   . Chronic lymphocytic leukemia     Current Outpatient Prescriptions  Medication Sig Dispense Refill  . amLODipine (NORVASC) 5 MG tablet Take 5 mg by mouth every morning.     . Ascorbic Acid (VITAMIN C) 1000 MG tablet Take 1,000 mg by mouth every morning.     Marland Kitchen atorvastatin (LIPITOR) 10 MG tablet Take 10 mg by mouth at bedtime.     . calcium carbonate (OS-CAL) 600 MG TABS Take 600 mg by mouth daily.    . cholecalciferol (VITAMIN D) 1000 UNITS tablet Take 1,000 Units by mouth every morning.    . Coenzyme Q10 (CO Q-10) 100 MG CAPS Take 200 mg by mouth daily.     . digoxin (LANOXIN) 0.25 MG tablet Take 0.125 mcg by mouth every morning. Dosage and instructions not specified    . glimepiride (AMARYL) 4 MG tablet Take 4 mg by mouth 2 (two) times daily.    . hydrochlorothiazide 25 MG tablet Take 25 mg by mouth every morning.     . insulin aspart (NOVOLOG  FLEXPEN) 100 UNIT/ML FlexPen Inject 6-20 Units into the skin every evening.    . insulin detemir (LEVEMIR FLEXPEN) 100 UNIT/ML injection Inject 42 Units into the skin daily with breakfast.     . Liraglutide (VICTOZA) 18 MG/3ML SOLN Inject 1.8 mg into the skin every morning. One injection every morning    . lisinopril (PRINIVIL,ZESTRIL) 40 MG tablet Take 40 mg by mouth daily after supper.     Marland Kitchen LORazepam (ATIVAN) 1 MG tablet Take 2 mg by mouth at bedtime.     . Multiple Vitamin (MULITIVITAMIN WITH MINERALS) TABS Take 1 tablet by mouth every morning.    . Omega-3 Fatty Acids (FISH OIL) 1200 MG CAPS Take 1,200 mg by mouth 2 (two) times daily.     . pioglitazone (ACTOS) 30 MG tablet Take 30 mg by mouth daily.    . vitamin E 400 UNIT capsule Take 400 Units by mouth every morning.     . warfarin (COUMADIN) 5 MG tablet Take 1 to 1.5 tablets by mouth daily as directed by coumadin clinic 105 tablet 3   No current facility-administered medications for this visit.    Allergies:  Penicillins   Social History: The patient  reports that he quit smoking about 51 years ago. His smoking use included Cigarettes. He quit after 10  years of use. He has never used smokeless tobacco. He reports that he does not drink alcohol or use illicit drugs.   ROS:  Please see the history of present illness. Otherwise, complete review of systems is positive for arthritic pains.  All other systems are reviewed and negative.   Physical Exam: VS:  BP 117/70 mmHg  Pulse 83  Ht 5\' 11"  (1.803 m)  Wt 292 lb 1.9 oz (132.505 kg)  BMI 40.76 kg/m2  SpO2 96%, BMI Body mass index is 40.76 kg/(m^2).  Wt Readings from Last 3 Encounters:  01/03/15 292 lb 1.9 oz (132.505 kg)  06/19/14 283 lb (128.368 kg)  07/31/13 281 lb 6.4 oz (127.642 kg)     Appears comfortable at rest.  HEENT: Conjunctiva and lids are normal, oropharynx with moist mucosa.  Neck: Supple, no elevated JVP or carotid bruits, no thyromegaly.  Lungs: Clear  auscultation, nonlabored.  Cardiac: Irregularly irregular, 2/6 systolic murmur at the base, preserved second heart sound, no rub.  Abdomen: Obese, protuberant, bowel sounds present, nontender.  Skin: Warm and dry.    ECG: ECG is not ordered today.  Other Studies Reviewed Today:  Echocardiogram from July 2013 revealed mild to moderate LVH with LVEF greater than 65%, moderate left atrial enlargement, mild mitral regurgitation, trace aortic regurgitation, mildly dilated right ventricle and moderately dilated right atrium, RVSP 40 mmHg.  Assessment and Plan:  1. Chronic atrial fibrillation. Symptomatically stable. Continue strategy of heart rate control and anticoagulation. He has been on digoxin for many years and has tolerated it well.  2. History of nonobstructive CAD without active angina symptoms. He is on ACE inhibitor and statin therapy.  Current medicines were reviewed with the patient today.  Disposition: FU with me in 6 months.   Signed, Satira Sark, MD, [facility] 01/03/2015 1:55 PM    [facility] [place], [place], [place] [address] Phone: [phone]; Fax: [phone]

## 2015-01-31 ENCOUNTER — Ambulatory Visit (INDEPENDENT_AMBULATORY_CARE_PROVIDER_SITE_OTHER): Payer: Medicare Other | Admitting: Otolaryngology

## 2015-01-31 DIAGNOSIS — J310 Chronic rhinitis: Secondary | ICD-10-CM | POA: Diagnosis not present

## 2015-02-05 ENCOUNTER — Ambulatory Visit (INDEPENDENT_AMBULATORY_CARE_PROVIDER_SITE_OTHER): Payer: Medicare Other | Admitting: *Deleted

## 2015-02-05 DIAGNOSIS — Z7901 Long term (current) use of anticoagulants: Secondary | ICD-10-CM | POA: Diagnosis not present

## 2015-02-05 DIAGNOSIS — I4891 Unspecified atrial fibrillation: Secondary | ICD-10-CM | POA: Diagnosis not present

## 2015-02-05 DIAGNOSIS — Z5181 Encounter for therapeutic drug level monitoring: Secondary | ICD-10-CM | POA: Diagnosis not present

## 2015-02-05 LAB — POCT INR: INR: 2.7

## 2015-03-19 ENCOUNTER — Ambulatory Visit (INDEPENDENT_AMBULATORY_CARE_PROVIDER_SITE_OTHER): Payer: Medicare Other | Admitting: *Deleted

## 2015-03-19 DIAGNOSIS — Z7901 Long term (current) use of anticoagulants: Secondary | ICD-10-CM

## 2015-03-19 DIAGNOSIS — I4891 Unspecified atrial fibrillation: Secondary | ICD-10-CM | POA: Diagnosis not present

## 2015-03-19 DIAGNOSIS — Z5181 Encounter for therapeutic drug level monitoring: Secondary | ICD-10-CM

## 2015-03-19 LAB — POCT INR: INR: 2.5

## 2015-04-11 ENCOUNTER — Ambulatory Visit (INDEPENDENT_AMBULATORY_CARE_PROVIDER_SITE_OTHER): Payer: Medicare Other | Admitting: Otolaryngology

## 2015-04-11 DIAGNOSIS — J310 Chronic rhinitis: Secondary | ICD-10-CM

## 2015-04-11 DIAGNOSIS — H903 Sensorineural hearing loss, bilateral: Secondary | ICD-10-CM

## 2015-04-12 DIAGNOSIS — Z418 Encounter for other procedures for purposes other than remedying health state: Secondary | ICD-10-CM | POA: Diagnosis not present

## 2015-04-12 DIAGNOSIS — Z789 Other specified health status: Secondary | ICD-10-CM | POA: Diagnosis not present

## 2015-04-12 DIAGNOSIS — E1142 Type 2 diabetes mellitus with diabetic polyneuropathy: Secondary | ICD-10-CM | POA: Diagnosis not present

## 2015-04-30 ENCOUNTER — Ambulatory Visit (INDEPENDENT_AMBULATORY_CARE_PROVIDER_SITE_OTHER): Payer: Medicare Other | Admitting: *Deleted

## 2015-04-30 DIAGNOSIS — E1142 Type 2 diabetes mellitus with diabetic polyneuropathy: Secondary | ICD-10-CM | POA: Diagnosis not present

## 2015-04-30 DIAGNOSIS — I4891 Unspecified atrial fibrillation: Secondary | ICD-10-CM | POA: Diagnosis not present

## 2015-04-30 DIAGNOSIS — B351 Tinea unguium: Secondary | ICD-10-CM | POA: Diagnosis not present

## 2015-04-30 DIAGNOSIS — L851 Acquired keratosis [keratoderma] palmaris et plantaris: Secondary | ICD-10-CM | POA: Diagnosis not present

## 2015-04-30 DIAGNOSIS — Z5181 Encounter for therapeutic drug level monitoring: Secondary | ICD-10-CM | POA: Diagnosis not present

## 2015-04-30 DIAGNOSIS — Z7901 Long term (current) use of anticoagulants: Secondary | ICD-10-CM

## 2015-04-30 LAB — POCT INR: INR: 2.4

## 2015-05-13 DIAGNOSIS — E1142 Type 2 diabetes mellitus with diabetic polyneuropathy: Secondary | ICD-10-CM | POA: Diagnosis not present

## 2015-05-13 DIAGNOSIS — N182 Chronic kidney disease, stage 2 (mild): Secondary | ICD-10-CM | POA: Diagnosis not present

## 2015-05-13 DIAGNOSIS — Z6841 Body Mass Index (BMI) 40.0 and over, adult: Secondary | ICD-10-CM | POA: Diagnosis not present

## 2015-05-13 DIAGNOSIS — C9111 Chronic lymphocytic leukemia of B-cell type in remission: Secondary | ICD-10-CM | POA: Diagnosis not present

## 2015-06-14 ENCOUNTER — Ambulatory Visit (INDEPENDENT_AMBULATORY_CARE_PROVIDER_SITE_OTHER): Payer: Medicare Other | Admitting: *Deleted

## 2015-06-14 DIAGNOSIS — Z5181 Encounter for therapeutic drug level monitoring: Secondary | ICD-10-CM | POA: Diagnosis not present

## 2015-06-14 DIAGNOSIS — Z7901 Long term (current) use of anticoagulants: Secondary | ICD-10-CM | POA: Diagnosis not present

## 2015-06-14 DIAGNOSIS — I4891 Unspecified atrial fibrillation: Secondary | ICD-10-CM

## 2015-06-14 LAB — POCT INR: INR: 2

## 2015-07-09 DIAGNOSIS — E1142 Type 2 diabetes mellitus with diabetic polyneuropathy: Secondary | ICD-10-CM | POA: Diagnosis not present

## 2015-07-09 DIAGNOSIS — L851 Acquired keratosis [keratoderma] palmaris et plantaris: Secondary | ICD-10-CM | POA: Diagnosis not present

## 2015-07-09 DIAGNOSIS — B351 Tinea unguium: Secondary | ICD-10-CM | POA: Diagnosis not present

## 2015-07-25 ENCOUNTER — Ambulatory Visit (INDEPENDENT_AMBULATORY_CARE_PROVIDER_SITE_OTHER): Payer: Medicare Other | Admitting: *Deleted

## 2015-07-25 DIAGNOSIS — I4891 Unspecified atrial fibrillation: Secondary | ICD-10-CM

## 2015-07-25 DIAGNOSIS — Z7901 Long term (current) use of anticoagulants: Secondary | ICD-10-CM | POA: Diagnosis not present

## 2015-07-25 DIAGNOSIS — Z5181 Encounter for therapeutic drug level monitoring: Secondary | ICD-10-CM | POA: Diagnosis not present

## 2015-07-25 LAB — POCT INR: INR: 2

## 2015-07-26 ENCOUNTER — Encounter: Payer: Self-pay | Admitting: Cardiology

## 2015-07-26 ENCOUNTER — Ambulatory Visit (INDEPENDENT_AMBULATORY_CARE_PROVIDER_SITE_OTHER): Payer: Medicare Other | Admitting: Cardiology

## 2015-07-26 VITALS — BP 109/66 | HR 73 | Ht 71.0 in | Wt 293.4 lb

## 2015-07-26 DIAGNOSIS — I10 Essential (primary) hypertension: Secondary | ICD-10-CM

## 2015-07-26 DIAGNOSIS — I482 Chronic atrial fibrillation, unspecified: Secondary | ICD-10-CM

## 2015-07-26 DIAGNOSIS — E782 Mixed hyperlipidemia: Secondary | ICD-10-CM

## 2015-07-26 DIAGNOSIS — I4891 Unspecified atrial fibrillation: Secondary | ICD-10-CM | POA: Diagnosis not present

## 2015-07-26 NOTE — Patient Instructions (Signed)
Your physician recommends that you continue on your current medications as directed. Please refer to the Current Medication list given to you today. Your physician recommends that you schedule a follow-up appointment in: 6 months. You will receive a reminder letter in the mail in about 4 months reminding you to call and schedule your appointment. If you don't receive this letter, please contact our office. 

## 2015-07-26 NOTE — Progress Notes (Signed)
Cardiology Office Note  Date: 07/26/2015   ID: [name] [name], DOB [birth year], MRN [id number]  PCP: [name], MD  Primary Cardiologist: [name], MD   Chief Complaint  Patient presents with  . Atrial Fibrillation    History of Present Illness: [name] [name] is a 81 y.o. male last seen in September 2016. He presents for a routine follow-up visit. Since last encounter he has not reported any significant palpitations or chest pain. He continues to follow with Dr. Manuella Ghazi, has been working on trying to get his glucose under better control.  He continues on Coumadin with follow-up in the anticoagulation clinic. Recent INR 2.0. I reviewed his ECG today which shows rate-controlled atrial fibrillation with right bundle branch block and left anterior second block.  I reviewed his medications. He continues on Norvasc, Lipitor, Lanoxin, lisinopril, and Coumadin.  Past Medical History  Diagnosis Date  . Type 2 diabetes mellitus ([place])   . IgA nephropathy     Dr. Lowanda Foster  . Atrial fibrillation ([place])   . Coronary atherosclerosis of native coronary artery     Nonobstructive  . Hyperlipidemia   . Essential hypertension, benign   . Chronic lymphocytic leukemia ([place])     Past Surgical History  Procedure Laterality Date  . Right total knee replacement    . Open repair left quadriceps tendon.  08/22/07    Dr. Aline Brochure  . Transurethral resection of prostate    . Anal fissure repair    . Femoral hernia repair    . Colonoscopy  08/19/2011    Procedure: COLONOSCOPY;  Surgeon: Rogene Houston, MD;  Location: [facility];  Service: Endoscopy;  Laterality: N/A;  930  . Amputation Left 09/15/2012    Procedure: PARTIAL AMPUTATION 3rd TOE LEFT FOOT;  Surgeon: Marcheta Grammes, DPM;  Location: [facility] ORS;  Service: Orthopedics;  Laterality: Left;  . Amputation Left 04/20/2013    Procedure: PARTIAL AMPUTATION 2ND TOE LEFT FOOT;  Surgeon: Marcheta Grammes, DPM;  Location: [facility] OR[facility];   Service: Orthopedics;  Laterality: Left;  . Colonoscopy N/A 09/19/2014    Procedure: COLONOSCOPY;  Surgeon: Rogene Houston, MD;  Location: [facility];  Service: Endoscopy;  Laterality: N/A;  1055    Current Outpatient Prescriptions  Medication Sig Dispense Refill  . amLODipine (NORVASC) 5 MG tablet Take 5 mg by mouth every morning.     . Ascorbic Acid (VITAMIN C) 1000 MG tablet Take 1,000 mg by mouth every morning.     Marland Kitchen atorvastatin (LIPITOR) 10 MG tablet Take 10 mg by mouth at bedtime.     . calcium carbonate (OS-CAL) 600 MG TABS Take 600 mg by mouth daily.    . cholecalciferol (VITAMIN D) 1000 UNITS tablet Take 1,000 Units by mouth every morning.    . Coenzyme Q10 (CO Q-10) 100 MG CAPS Take 200 mg by mouth daily.     . digoxin (LANOXIN) 0.25 MG tablet Take 0.125 mcg by mouth every morning. Dosage and instructions not specified    . glimepiride (AMARYL) 4 MG tablet Take 4 mg by mouth 2 (two) times daily.    . hydrochlorothiazide 25 MG tablet Take 25 mg by mouth every morning.     . insulin aspart (NOVOLOG FLEXPEN) 100 UNIT/ML FlexPen Inject 6-20 Units into the skin every evening.    . insulin detemir (LEVEMIR FLEXPEN) 100 UNIT/ML injection Inject 50 Units into the skin daily with breakfast.     . Liraglutide (VICTOZA) 18 MG/3ML SOLN  Inject 1.8 mg into the skin every morning. One injection every morning    . lisinopril (PRINIVIL,ZESTRIL) 40 MG tablet Take 40 mg by mouth daily after supper.     Marland Kitchen LORazepam (ATIVAN) 1 MG tablet Take 2 mg by mouth at bedtime.     . Multiple Vitamin (MULITIVITAMIN WITH MINERALS) TABS Take 1 tablet by mouth every morning.    . Omega-3 Fatty Acids (FISH OIL) 1200 MG CAPS Take 1,200 mg by mouth 2 (two) times daily.     . pioglitazone (ACTOS) 30 MG tablet Take 30 mg by mouth daily.    . vitamin E 400 UNIT capsule Take 400 Units by mouth every morning.     . warfarin (COUMADIN) 5 MG tablet Take 1 to 1.5 tablets by mouth daily as directed by coumadin clinic 105  tablet 3   No current facility-administered medications for this visit.   Allergies:  Penicillins   Social History: The patient  reports that he quit smoking about 52 years ago. His smoking use included Cigarettes. He quit after 10 years of use. He has never used smokeless tobacco. He reports that he does not drink alcohol or use illicit drugs.   ROS:  Please see the history of present illness. Otherwise, complete review of systems is positive for decreased hearing.  All other systems are reviewed and negative.   Physical Exam: VS:  BP 109/66 mmHg  Pulse 73  Ht 5\' 11"  (1.803 m)  Wt 293 lb 6.4 oz (133.085 kg)  BMI 40.94 kg/m2  SpO2 95%, BMI Body mass index is 40.94 kg/(m^2).  Wt Readings from Last 3 Encounters:  07/26/15 293 lb 6.4 oz (133.085 kg)  01/03/15 292 lb 1.9 oz (132.505 kg)  06/19/14 283 lb (128.368 kg)    Appears comfortable at rest.  HEENT: Conjunctiva and lids are normal, oropharynx with moist mucosa.  Neck: Supple, no elevated JVP or carotid bruits, no thyromegaly.  Lungs: Clear auscultation, nonlabored.  Cardiac: Irregularly irregular, 2/6 systolic murmur at the base, preserved second heart sound, no rub.  Abdomen: Obese, protuberant, bowel sounds present, nontender.  Skin: Warm and dry.   ECG: I personally reviewed the prior tracing from 06/19/2014 which showed rate-controlled atrial fibrillation with right bundle branch block.  Other Studies Reviewed Today:  Echocardiogram July 2013: Mild to moderate LVH with LVEF greater than 65%, moderate left atrial enlargement, mild mitral regurgitation, trace aortic regurgitation, mildly dilated right ventricle and moderately dilated right atrium, RVSP 40 mmHg.  Assessment and Plan:  1. Chronic atrial fibrillation, asymptomatic. Continue strategy of heart rate control and anticoagulation.  2. Essential hypertension, blood pressure is well controlled today.  3. Hyperlipidemia, continues on Lipitor. Followed by Dr.  Manuella Ghazi.  Current medicines were reviewed with the patient today.   Orders Placed This Encounter  Procedures  . EKG 12-Lead    Disposition: FU with me in 6 months.   Signed, Satira Sark, MD, [facility] 07/26/2015 2:51 PM    [facility] [place], [place], [place] [address] Phone: [phone]; Fax: [phone]

## 2015-08-02 DIAGNOSIS — E119 Type 2 diabetes mellitus without complications: Secondary | ICD-10-CM | POA: Diagnosis not present

## 2015-08-02 DIAGNOSIS — Z7984 Long term (current) use of oral hypoglycemic drugs: Secondary | ICD-10-CM | POA: Diagnosis not present

## 2015-08-02 DIAGNOSIS — Z794 Long term (current) use of insulin: Secondary | ICD-10-CM | POA: Diagnosis not present

## 2015-08-02 DIAGNOSIS — E1165 Type 2 diabetes mellitus with hyperglycemia: Secondary | ICD-10-CM | POA: Diagnosis not present

## 2015-08-08 DIAGNOSIS — I10 Essential (primary) hypertension: Secondary | ICD-10-CM | POA: Diagnosis not present

## 2015-08-08 DIAGNOSIS — E119 Type 2 diabetes mellitus without complications: Secondary | ICD-10-CM | POA: Diagnosis not present

## 2015-08-16 DIAGNOSIS — E1129 Type 2 diabetes mellitus with other diabetic kidney complication: Secondary | ICD-10-CM | POA: Diagnosis not present

## 2015-08-16 DIAGNOSIS — F419 Anxiety disorder, unspecified: Secondary | ICD-10-CM | POA: Diagnosis not present

## 2015-08-16 DIAGNOSIS — N182 Chronic kidney disease, stage 2 (mild): Secondary | ICD-10-CM | POA: Diagnosis not present

## 2015-09-05 ENCOUNTER — Ambulatory Visit (INDEPENDENT_AMBULATORY_CARE_PROVIDER_SITE_OTHER): Payer: Medicare Other | Admitting: *Deleted

## 2015-09-05 DIAGNOSIS — Z7901 Long term (current) use of anticoagulants: Secondary | ICD-10-CM

## 2015-09-05 DIAGNOSIS — I4891 Unspecified atrial fibrillation: Secondary | ICD-10-CM | POA: Diagnosis not present

## 2015-09-05 DIAGNOSIS — Z5181 Encounter for therapeutic drug level monitoring: Secondary | ICD-10-CM

## 2015-09-05 LAB — POCT INR: INR: 2.4

## 2015-09-09 DIAGNOSIS — H2511 Age-related nuclear cataract, right eye: Secondary | ICD-10-CM | POA: Diagnosis not present

## 2015-09-09 DIAGNOSIS — H35371 Puckering of macula, right eye: Secondary | ICD-10-CM | POA: Diagnosis not present

## 2015-09-09 DIAGNOSIS — H2513 Age-related nuclear cataract, bilateral: Secondary | ICD-10-CM | POA: Diagnosis not present

## 2015-09-09 DIAGNOSIS — E119 Type 2 diabetes mellitus without complications: Secondary | ICD-10-CM | POA: Diagnosis not present

## 2015-09-17 DIAGNOSIS — L851 Acquired keratosis [keratoderma] palmaris et plantaris: Secondary | ICD-10-CM | POA: Diagnosis not present

## 2015-09-17 DIAGNOSIS — E1142 Type 2 diabetes mellitus with diabetic polyneuropathy: Secondary | ICD-10-CM | POA: Diagnosis not present

## 2015-09-17 DIAGNOSIS — B351 Tinea unguium: Secondary | ICD-10-CM | POA: Diagnosis not present

## 2015-09-24 NOTE — Patient Instructions (Addendum)
Your procedure is scheduled on:  09/30/2015               Report to [facility] at   6:30  AM.  Call this number if you have problems the morning of surgery: [phone]   Remember:   Do not eat or drink :After Midnight.    Take these medicines the morning of surgery with A SIP OF WATER:  Amlodipine, digoxin, and lisinopril - Takes at night        No diabetic medication morning of procedure   Do not wear jewelry, make-up or nail polish.  Do not wear lotions, powders, or perfumes. You may wear deodorant.  Do not bring valuables to the hospital.  Contacts, dentures or bridgework may not be worn into surgery.  Patients discharged the day of surgery will not be allowed to drive home.  Name and phone number of your driver:    @[email or web address] @ Cataract Surgery  A cataract is a clouding of the lens of the eye. When a lens becomes cloudy, vision is reduced based on the degree and nature of the clouding. Surgery may be needed to improve vision. Surgery removes the cloudy lens and usually replaces it with a substitute lens (intraocular lens, IOL). LET YOUR EYE DOCTOR KNOW ABOUT:  Allergies to food or medicine.   Medicines taken including herbs, eyedrops, over-the-counter medicines, and creams.   Use of steroids (by mouth or creams).   Previous problems with anesthetics or numbing medicine.   History of bleeding problems or blood clots.   Previous surgery.   Other health problems, including diabetes and kidney problems.   Possibility of pregnancy, if this applies.  RISKS AND COMPLICATIONS  Infection.   Inflammation of the eyeball (endophthalmitis) that can spread to both eyes (sympathetic ophthalmia).   Poor wound healing.   If an IOL is inserted, it can later fall out of proper position. This is very uncommon.   Clouding of the part of your eye that holds an IOL in place. This is called an "after-cataract." These are uncommon, but easily treated.  BEFORE THE PROCEDURE  Do  not eat or drink anything except small amounts of water for 8 to 12 before your surgery, or as directed by your caregiver.   Unless you are told otherwise, continue any eyedrops you have been prescribed.   Talk to your primary caregiver about all other medicines that you take (both prescription and non-prescription). In some cases, you may need to stop or change medicines near the time of your surgery. This is most important if you are taking blood-thinning medicine.Do not stop medicines unless you are told to do so.   Arrange for someone to drive you to and from the procedure.   Do not put contact lenses in either eye on the day of your surgery.  PROCEDURE There is more than one method for safely removing a cataract. Your doctor can explain the differences and help determine which is best for you. Phacoemulsification surgery is the most common form of cataract surgery.  An injection is given behind the eye or eyedrops are given to make this a painless procedure.   A small cut (incision) is made on the edge of the clear, dome-shaped surface that covers the front of the eye (cornea).   A tiny probe is painlessly inserted into the eye. This device gives off ultrasound waves that soften and break up the cloudy center of the lens. This makes it easier for the cloudy  lens to be removed by suction.   An IOL may be implanted.   The normal lens of the eye is covered by a clear capsule. Part of that capsule is intentionally left in the eye to support the IOL.   Your surgeon may or may not use stitches to close the incision.  There are other forms of cataract surgery that require a larger incision and stiches to close the eye. This approach is taken in cases where the doctor feels that the cataract cannot be easily removed using phacoemulsification. AFTER THE PROCEDURE  When an IOL is implanted, it does not need care. It becomes a permanent part of your eye and cannot be seen or felt.   Your  doctor will schedule follow-up exams to check on your progress.   Review your other medicines with your doctor to see which can be resumed after surgery.   Use eyedrops or take medicine as prescribed by your doctor.  Document Released: 03/12/2011 Document Reviewed: 03/09/2011 [facility] Patient Information 2012 [place].  .Cataract Surgery Care After Refer to this sheet in the next few weeks. These instructions provide you with information on caring for yourself after your procedure. Your caregiver may also give you more specific instructions. Your treatment has been planned according to current medical practices, but problems sometimes occur. Call your caregiver if you have any problems or questions after your procedure.  HOME CARE INSTRUCTIONS   Avoid strenuous activities as directed by your caregiver.   Ask your caregiver when you can resume driving.   Use eyedrops or other medicines to help healing and control pressure inside your eye as directed by your caregiver.   Only take over-the-counter or prescription medicines for pain, discomfort, or fever as directed by your caregiver.   Do not to touch or rub your eyes.   You may be instructed to use a protective shield during the first few days and nights after surgery. If not, wear sunglasses to protect your eyes. This is to protect the eye from pressure or from being accidentally bumped.   Keep the area around your eye clean and dry. Avoid swimming or allowing water to hit you directly in the face while showering. Keep soap and shampoo out of your eyes.   Do not bend or lift heavy objects. Bending increases pressure in the eye. You can walk, climb stairs, and do light household chores.   Do not put a contact lens into the eye that had surgery until your caregiver says it is okay to do so.   Ask your doctor when you can return to work. This will depend on the kind of work that you do. If you work in a dusty environment, you may  be advised to wear protective eyewear for a period of time.   Ask your caregiver when it will be safe to engage in sexual activity.   Continue with your regular eye exams as directed by your caregiver.  What to expect:  It is normal to feel itching and mild discomfort for a few days after cataract surgery. Some fluid discharge is also common, and your eye may be sensitive to light and touch.   After 1 to 2 days, even moderate discomfort should disappear. In most cases, healing will take about 6 weeks.   If you received an intraocular lens (IOL), you may notice that colors are very bright or have a blue tinge. Also, if you have been in bright sunlight, everything may appear reddish for  a few hours. If you see these color tinges, it is because your lens is clear and no longer cloudy. Within a few months after receiving an IOL, these extra colors should go away. When you have healed, you will probably need new glasses.  SEEK MEDICAL CARE IF:   You have increased bruising around your eye.   You have discomfort not helped by medicine.  SEEK IMMEDIATE MEDICAL CARE IF:   You have a fever.   You have a worsening or sudden vision loss.   You have redness, swelling, or increasing pain in the eye.   You have a thick discharge from the eye that had surgery.  MAKE SURE YOU:  Understand these instructions.   Will watch your condition.   Will get help right away if you are not doing well or get worse.  Document Released: 10/10/2004 Document Revised: 03/12/2011 Document Reviewed: 11/14/2010 [facility] Patient Information 2012 [place].    Monitored Anesthesia Care  Monitored anesthesia care is an anesthesia service for a medical procedure. Anesthesia is the loss of the ability to feel pain. It is produced by medications called anesthetics. It may affect a small area of your body (local anesthesia), a large area of your body (regional anesthesia), or your entire body (general anesthesia).  The need for monitored anesthesia care depends your procedure, your condition, and the potential need for regional or general anesthesia. It is often provided during procedures where:   General anesthesia may be needed if there are complications. This is because you need special care when you are under general anesthesia.   You will be under local or regional anesthesia. This is so that you are able to have higher levels of anesthesia if needed.   You will receive calming medications (sedatives). This is especially the case if sedatives are given to put you in a semi-conscious state of relaxation (deep sedation). This is because the amount of sedative needed to produce this state can be hard to predict. Too much of a sedative can produce general anesthesia. Monitored anesthesia care is performed by one or more caregivers who have special training in all types of anesthesia. You will need to meet with these caregivers before your procedure. During this meeting, they will ask you about your medical history. They will also give you instructions to follow. (For example, you will need to stop eating and drinking before your procedure. You may also need to stop or change medications you are taking.) During your procedure, your caregivers will stay with you. They will:   Watch your condition. This includes watching you blood pressure, breathing, and level of pain.   Diagnose and treat problems that occur.   Give medications if they are needed. These may include calming medications (sedatives) and anesthetics.   Make sure you are comfortable.  Having monitored anesthesia care does not necessarily mean that you will be under anesthesia. It does mean that your caregivers will be able to manage anesthesia if you need it or if it occurs. It also means that you will be able to have a different type of anesthesia than you are having if you need it. When your procedure is complete, your caregivers will  continue to watch your condition. They will make sure any medications wear off before you are allowed to go home.  Document Released: 12/17/2004 Document Revised: 07/18/2012 Document Reviewed: 05/04/2012 [facility] Patient Information 2014 [place], [place].

## 2015-09-25 ENCOUNTER — Encounter (HOSPITAL_COMMUNITY)
Admission: RE | Admit: 2015-09-25 | Discharge: 2015-09-25 | Disposition: A | Discharge status: Home / Self Care | Payer: Medicare Other | Source: Ambulatory Visit | Attending: Ophthalmology | Admitting: Ophthalmology

## 2015-09-25 ENCOUNTER — Encounter (HOSPITAL_COMMUNITY): Payer: Self-pay

## 2015-09-25 DIAGNOSIS — Z01812 Encounter for preprocedural laboratory examination: Secondary | ICD-10-CM | POA: Insufficient documentation

## 2015-09-25 DIAGNOSIS — H269 Unspecified cataract: Secondary | ICD-10-CM | POA: Insufficient documentation

## 2015-09-25 LAB — BASIC METABOLIC PANEL
Anion gap: 7 (ref 5–15)
BUN: 29 mg/dL — AB (ref 6–20)
CO2: 26 mmol/L (ref 22–32)
Calcium: 9.3 mg/dL (ref 8.9–10.3)
Chloride: 103 mmol/L (ref 101–111)
Creatinine, Ser: 0.88 mg/dL (ref 0.61–1.24)
GFR calc Af Amer: >60 mL/min (ref >60)
GFR calc non Af Amer: >60 mL/min (ref >60)
Glucose, Bld: 188 mg/dL — ABNORMAL HIGH (ref 65–99)
Potassium: 3.9 mmol/L (ref 3.5–5.1)
SODIUM: 136 mmol/L (ref 135–145)

## 2015-09-25 LAB — CBC
HCT: 43.5 % (ref 39.0–52.0)
Hemoglobin: 14.1 g/dL (ref 13.0–17.0)
MCH: 28.9 pg (ref 26.0–34.0)
MCHC: 32.4 g/dL (ref 30.0–36.0)
MCV: 89.1 fL (ref 78.0–100.0)
Platelets: 163 10*3/uL (ref 150–400)
RBC: 4.88 MIL/uL (ref 4.22–5.81)
RDW: 15.3 % (ref 11.5–15.5)
WBC: 16.5 10*3/uL — ABNORMAL HIGH (ref 4.0–10.5)

## 2015-09-26 NOTE — Anesthesia Preprocedure Evaluation (Addendum)
Anesthesia Evaluation  Patient identified by MRN, date of birth, ID band Patient awake    Reviewed: Allergy & Precautions, NPO status , Patient's Chart, lab work & pertinent test results  Airway Mallampati: II  TM Distance: >3 FB Neck ROM: Full    Dental  (+) Partial Upper   Pulmonary former smoker,    breath sounds clear to auscultation       Cardiovascular hypertension, Pt. on medications + CAD  + dysrhythmias Atrial Fibrillation  Rhythm:Regular Rate:Normal     Neuro/Psych negative neurological ROS  negative psych ROS   GI/Hepatic negative GI ROS, Neg liver ROS,   Endo/Other  diabetes, Type 2, Insulin Dependent, Oral Hypoglycemic Agents  Renal/GU Renal disease  negative genitourinary   Musculoskeletal negative musculoskeletal ROS (+)   Abdominal   Peds negative pediatric ROS (+)  Hematology negative hematology ROS (+)   Anesthesia Other Findings   Reproductive/Obstetrics negative OB ROS                            Lab Results  Component Value Date   WBC 16.5* 09/25/2015   HGB 14.1 09/25/2015   HCT 43.5 09/25/2015   MCV 89.1 09/25/2015   PLT 163 09/25/2015   Lab Results  Component Value Date   INR 2.4 09/05/2015   INR 2.0 07/25/2015   INR 2.0 06/14/2015     Anesthesia Physical Anesthesia Plan  ASA: III  Anesthesia Plan: MAC   Post-op Pain Management:    Induction: Intravenous  Airway Management Planned: Natural Airway  Additional Equipment:   Intra-op Plan:   Post-operative Plan:   Informed Consent: I have reviewed the patients History and Physical, chart, labs and discussed the procedure including the risks, benefits and alternatives for the proposed anesthesia with the patient or authorized representative who has indicated his/her understanding and acceptance.     Plan Discussed with: CRNA  Anesthesia Plan Comments:         Anesthesia Quick  Evaluation

## 2015-09-30 ENCOUNTER — Ambulatory Visit (HOSPITAL_COMMUNITY)
Admission: RE | Admit: 2015-09-30 | Discharge: 2015-09-30 | Disposition: A | Discharge status: Home / Self Care | Payer: Medicare Other | Source: Ambulatory Visit | Attending: Ophthalmology | Admitting: Ophthalmology

## 2015-09-30 ENCOUNTER — Ambulatory Visit (HOSPITAL_COMMUNITY): Payer: Medicare Other | Admitting: Anesthesiology

## 2015-09-30 ENCOUNTER — Encounter (HOSPITAL_COMMUNITY)
Admission: RE | Disposition: A | Discharge status: Home / Self Care | Payer: Self-pay | Source: Ambulatory Visit | Attending: Ophthalmology

## 2015-09-30 ENCOUNTER — Encounter (HOSPITAL_COMMUNITY): Payer: Self-pay | Admitting: Ophthalmology

## 2015-09-30 DIAGNOSIS — Z87891 Personal history of nicotine dependence: Secondary | ICD-10-CM | POA: Diagnosis not present

## 2015-09-30 DIAGNOSIS — Z794 Long term (current) use of insulin: Secondary | ICD-10-CM | POA: Insufficient documentation

## 2015-09-30 DIAGNOSIS — Z79899 Other long term (current) drug therapy: Secondary | ICD-10-CM | POA: Insufficient documentation

## 2015-09-30 DIAGNOSIS — I4891 Unspecified atrial fibrillation: Secondary | ICD-10-CM | POA: Insufficient documentation

## 2015-09-30 DIAGNOSIS — H2511 Age-related nuclear cataract, right eye: Secondary | ICD-10-CM | POA: Diagnosis not present

## 2015-09-30 DIAGNOSIS — I10 Essential (primary) hypertension: Secondary | ICD-10-CM | POA: Diagnosis not present

## 2015-09-30 DIAGNOSIS — E118 Type 2 diabetes mellitus with unspecified complications: Secondary | ICD-10-CM | POA: Insufficient documentation

## 2015-09-30 DIAGNOSIS — Z7901 Long term (current) use of anticoagulants: Secondary | ICD-10-CM | POA: Diagnosis not present

## 2015-09-30 HISTORY — PX: CATARACT EXTRACTION W/PHACO: SHX586

## 2015-09-30 LAB — GLUCOSE, CAPILLARY: GLUCOSE-CAPILLARY: 95 mg/dL (ref 65–99)

## 2015-09-30 SURGERY — PHACOEMULSIFICATION, CATARACT, WITH IOL INSERTION
Anesthesia: Monitor Anesthesia Care | Site: Eye | Laterality: Right

## 2015-09-30 MED ORDER — LIDOCAINE HCL 3.5 % OP GEL
1.0000 "application " | Freq: Once | OPHTHALMIC | Status: AC
  Administered 2015-09-30: 1 via OPHTHALMIC

## 2015-09-30 MED ORDER — PROVISC 10 MG/ML IO SOLN
INTRAOCULAR | Status: DC | PRN
  Administered 2015-09-30: 0.85 mL via INTRAOCULAR

## 2015-09-30 MED ORDER — MIDAZOLAM HCL 2 MG/2ML IJ SOLN
1.0000 mg | INTRAMUSCULAR | Status: DC | PRN
  Administered 2015-09-30: 2 mg via INTRAVENOUS

## 2015-09-30 MED ORDER — NEOMYCIN-POLYMYXIN-DEXAMETH 3.5-10000-0.1 OP SUSP
OPHTHALMIC | Status: DC | PRN
  Administered 2015-09-30: 2 [drp] via OPHTHALMIC

## 2015-09-30 MED ORDER — LIDOCAINE HCL (PF) 1 % IJ SOLN
INTRAOCULAR | Status: DC | PRN
  Administered 2015-09-30: .9 mL via OPHTHALMIC

## 2015-09-30 MED ORDER — LACTATED RINGERS IV SOLN
INTRAVENOUS | Status: DC
  Administered 2015-09-30: 08:00:00 via INTRAVENOUS

## 2015-09-30 MED ORDER — MIDAZOLAM HCL 2 MG/2ML IJ SOLN
INTRAMUSCULAR | Status: AC
  Filled 2015-09-30: qty 2

## 2015-09-30 MED ORDER — CYCLOPENTOLATE-PHENYLEPHRINE 0.2-1 % OP SOLN
1.0000 [drp] | OPHTHALMIC | Status: AC
  Administered 2015-09-30 (×3): 1 [drp] via OPHTHALMIC

## 2015-09-30 MED ORDER — LIDOCAINE 3.5 % OP GEL OPTIME - NO CHARGE
OPHTHALMIC | Status: DC | PRN
  Administered 2015-09-30: 1 [drp] via OPHTHALMIC

## 2015-09-30 MED ORDER — BSS IO SOLN
INTRAOCULAR | Status: DC | PRN
  Administered 2015-09-30: 15 mL

## 2015-09-30 MED ORDER — FENTANYL CITRATE (PF) 100 MCG/2ML IJ SOLN
25.0000 ug | INTRAMUSCULAR | Status: DC | PRN
  Administered 2015-09-30: 25 ug via INTRAVENOUS

## 2015-09-30 MED ORDER — EPINEPHRINE HCL 1 MG/ML IJ SOLN
INTRAMUSCULAR | Status: AC
  Filled 2015-09-30: qty 1

## 2015-09-30 MED ORDER — TETRACAINE HCL 0.5 % OP SOLN
1.0000 [drp] | OPHTHALMIC | Status: AC
  Administered 2015-09-30 (×3): 1 [drp] via OPHTHALMIC

## 2015-09-30 MED ORDER — FENTANYL CITRATE (PF) 100 MCG/2ML IJ SOLN
INTRAMUSCULAR | Status: AC
  Filled 2015-09-30: qty 2

## 2015-09-30 MED ORDER — EPINEPHRINE HCL 1 MG/ML IJ SOLN
INTRAOCULAR | Status: DC | PRN
  Administered 2015-09-30: 500 mL

## 2015-09-30 MED ORDER — PHENYLEPHRINE HCL 2.5 % OP SOLN
1.0000 [drp] | OPHTHALMIC | Status: AC
  Administered 2015-09-30 (×3): 1 [drp] via OPHTHALMIC

## 2015-09-30 MED ORDER — POVIDONE-IODINE 5 % OP SOLN
OPHTHALMIC | Status: DC | PRN
  Administered 2015-09-30: 1 via OPHTHALMIC

## 2015-09-30 MED ORDER — LACTATED RINGERS IV SOLN
INTRAVENOUS | Status: DC | PRN
  Administered 2015-09-30: 07:00:00 via INTRAVENOUS

## 2015-09-30 SURGICAL SUPPLY — 11 items
CLOTH BEACON ORANGE TIMEOUT ST (SAFETY) ×1 IMPLANT
EYE SHIELD UNIVERSAL CLEAR (GAUZE/BANDAGES/DRESSINGS) ×1 IMPLANT
GLOVE BIOGEL PI IND STRL 7.0 (GLOVE) IMPLANT
GLOVE BIOGEL PI INDICATOR 7.0 (GLOVE) ×1
GLOVE EXAM NITRILE MD LF STRL (GLOVE) ×1 IMPLANT
PAD ARMBOARD 7.5X6 YLW CONV (MISCELLANEOUS) ×1 IMPLANT
SIGHTPATH CAT PROC W REG LENS (Ophthalmic Related) ×2 IMPLANT
SYRINGE LUER LOK 1CC (MISCELLANEOUS) ×1 IMPLANT
TAPE SURG TRANSPORE 1 IN (GAUZE/BANDAGES/DRESSINGS) IMPLANT
TAPE SURGICAL TRANSPORE 1 IN (GAUZE/BANDAGES/DRESSINGS) ×1
WATER STERILE IRR 250ML POUR (IV SOLUTION) ×1 IMPLANT

## 2015-09-30 NOTE — Anesthesia Postprocedure Evaluation (Signed)
Anesthesia Post Note  Patient: [name] [name]  Procedure(s) Performed: Procedure(s) (LRB): CATARACT EXTRACTION PHACO AND INTRAOCULAR LENS PLACEMENT (IOC) (Right)  Patient location during evaluation: Short Stay Anesthesia Type: MAC Level of consciousness: awake and alert, oriented and patient cooperative Pain management: pain level controlled Vital Signs Assessment: post-procedure vital signs reviewed and stable Respiratory status: spontaneous breathing, nonlabored ventilation and respiratory function stable Cardiovascular status: blood pressure returned to baseline Postop Assessment: no signs of nausea or vomiting Anesthetic complications: no    Last Vitals:  Filed Vitals:   09/30/15 0745 09/30/15 0750  BP: 117/51 116/59  Temp:    Resp: 15 22    Last Pain: There were no vitals filed for this visit.               Champagne Paletta J

## 2015-09-30 NOTE — Transfer of Care (Signed)
Immediate Anesthesia Transfer of Care Note  Patient: [name] [name]  Procedure(s) Performed: Procedure(s) with comments: CATARACT EXTRACTION PHACO AND INTRAOCULAR LENS PLACEMENT (IOC) (Right) - CDE: 11.78  Patient Location: Short Stay  Anesthesia Type:MAC  Level of Consciousness: awake, alert , oriented and patient cooperative  Airway & Oxygen Therapy: Patient Spontanous Breathing  Post-op Assessment: Report given to RN, Post -op Vital signs reviewed and stable and Patient moving all extremities  Post vital signs: Reviewed and stable  Last Vitals:  Filed Vitals:   09/30/15 0745 09/30/15 0750  BP: 117/51 116/59  Temp:    Resp: 15 22    Last Pain: There were no vitals filed for this visit.    Patients Stated Pain Goal: 8 ([id number])  Complications: No apparent anesthesia complications

## 2015-09-30 NOTE — Discharge Instructions (Signed)
Anesthesia, Adult, Care After °Refer to this sheet in the next few weeks. These instructions provide you with information on caring for yourself after your procedure. Your health care provider may also give you more specific instructions. Your treatment has been planned according to current medical practices, but problems sometimes occur. Call your health care provider if you have any problems or questions after your procedure. °WHAT TO EXPECT AFTER THE PROCEDURE °After the procedure, it is typical to experience: °· Sleepiness. °· Nausea and vomiting. °HOME CARE INSTRUCTIONS °· For the first 24 hours after general anesthesia: °¨ Have a responsible person with you. °¨ Do not drive a car. If you are alone, do not take public transportation. °¨ Do not drink alcohol. °¨ Do not take medicine that has not been prescribed by your health care provider. °¨ Do not sign important papers or make important decisions. °¨ You may resume a normal diet and activities as directed by your health care provider. °· Change bandages (dressings) as directed. °· If you have questions or problems that seem related to general anesthesia, call the hospital and ask for the anesthetist or anesthesiologist on call. °SEEK MEDICAL CARE IF: °· You have nausea and vomiting that continue the day after anesthesia. °· You develop a rash. °SEEK IMMEDIATE MEDICAL CARE IF:  °· You have difficulty breathing. °· You have chest pain. °· You have any allergic problems. °  °This information is not intended to replace advice given to you by your health care provider. Make sure you discuss any questions you have with your health care provider. °  °Document Released: 06/29/2000 Document Revised: 04/13/2014 Document Reviewed: 07/22/2011 °Elsevier Interactive Patient Education ©2016 [employer] ° °

## 2015-09-30 NOTE — H&P (Signed)
I have reviewed the H&P, the patient was re-examined, and I have identified no interval changes in medical condition and plan of care since the history and physical of record  

## 2015-09-30 NOTE — Op Note (Signed)
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  Assistants: None  Anesthesia: Topical with MAC  Indications: Painless, progressive loss of vision with compromise of daily activities.  Surgery: Cataract Extraction with Intraocular lens Implant Right Eye  Discription: The patient had dilating drops and viscous lidocaine placed into the Right eye in the pre-op holding area. After transfer to the operating room, a time out was performed. The patient was then prepped and draped. Beginning with a 69 degree blade a paracentesis port was made at the surgeon's 2 o'clock position. The anterior chamber was then filled with 1% non-preserved lidocaine with epinepherine. This was followed by filling the anterior chamber with Provisc.  A 2.63mm keratome blade was used to make a clear corneal incision at the temporal limbus.  A bent cystatome needle was used to create a continuous tear capsulotomy. Hydrodissection was performed with balanced salt solution on a Fine canula. The lens nucleus was then removed using the phacoemulsification handpiece. Residual cortex was removed with the I&A handpiece. The anterior chamber and capsular bag were refilled with Provisc. A posterior chamber intraocular lens was placed into the capsular bag with it's injector. The implant was positioned with the Kuglan hook. The Provisc was then removed from the anterior chamber and capsular bag with the I&A handpiece. Stromal hydration of the main incision and paracentesis port was performed with BSS on a Fine canula. The wounds were tested for leak which was negative. The patient tolerated the procedure well. There were no operative complications. The patient was then transferred to the recovery room in stable condition.  Complications: None  Specimen: None  EBL: None  Prosthetic device: Hoya iSert  250, power 20.5 D, SN [id number].

## 2015-10-01 ENCOUNTER — Encounter (HOSPITAL_COMMUNITY): Payer: Self-pay | Admitting: Ophthalmology

## 2015-10-03 DIAGNOSIS — I10 Essential (primary) hypertension: Secondary | ICD-10-CM | POA: Diagnosis not present

## 2015-10-03 DIAGNOSIS — E119 Type 2 diabetes mellitus without complications: Secondary | ICD-10-CM | POA: Diagnosis not present

## 2015-10-15 DIAGNOSIS — N3090 Cystitis, unspecified without hematuria: Secondary | ICD-10-CM | POA: Diagnosis not present

## 2015-10-15 DIAGNOSIS — N400 Enlarged prostate without lower urinary tract symptoms: Secondary | ICD-10-CM | POA: Diagnosis not present

## 2015-10-21 DIAGNOSIS — Z961 Presence of intraocular lens: Secondary | ICD-10-CM | POA: Diagnosis not present

## 2015-10-21 DIAGNOSIS — H2512 Age-related nuclear cataract, left eye: Secondary | ICD-10-CM | POA: Diagnosis not present

## 2015-10-22 ENCOUNTER — Ambulatory Visit (INDEPENDENT_AMBULATORY_CARE_PROVIDER_SITE_OTHER): Payer: Medicare Other | Admitting: *Deleted

## 2015-10-22 DIAGNOSIS — I4891 Unspecified atrial fibrillation: Secondary | ICD-10-CM | POA: Diagnosis not present

## 2015-10-22 DIAGNOSIS — Z7901 Long term (current) use of anticoagulants: Secondary | ICD-10-CM

## 2015-10-22 DIAGNOSIS — Z5181 Encounter for therapeutic drug level monitoring: Secondary | ICD-10-CM | POA: Diagnosis not present

## 2015-10-22 LAB — POCT INR: INR: 1.8

## 2015-10-28 DIAGNOSIS — R350 Frequency of micturition: Secondary | ICD-10-CM | POA: Diagnosis not present

## 2015-10-28 DIAGNOSIS — I4891 Unspecified atrial fibrillation: Secondary | ICD-10-CM | POA: Diagnosis not present

## 2015-10-28 DIAGNOSIS — E7800 Pure hypercholesterolemia, unspecified: Secondary | ICD-10-CM | POA: Diagnosis not present

## 2015-10-28 DIAGNOSIS — E1142 Type 2 diabetes mellitus with diabetic polyneuropathy: Secondary | ICD-10-CM | POA: Diagnosis not present

## 2015-10-28 DIAGNOSIS — N419 Inflammatory disease of prostate, unspecified: Secondary | ICD-10-CM | POA: Diagnosis not present

## 2015-10-29 ENCOUNTER — Encounter (HOSPITAL_COMMUNITY)
Admission: RE | Admit: 2015-10-29 | Discharge: 2015-10-29 | Disposition: A | Discharge status: Home / Self Care | Payer: Medicare Other | Source: Ambulatory Visit | Attending: Ophthalmology | Admitting: Ophthalmology

## 2015-10-31 ENCOUNTER — Ambulatory Visit (HOSPITAL_COMMUNITY): Payer: Medicare Other | Admitting: Anesthesiology

## 2015-10-31 ENCOUNTER — Encounter (HOSPITAL_COMMUNITY): Payer: Self-pay | Admitting: *Deleted

## 2015-10-31 ENCOUNTER — Ambulatory Visit (HOSPITAL_COMMUNITY)
Admission: RE | Admit: 2015-10-31 | Discharge: 2015-10-31 | Disposition: A | Discharge status: Home / Self Care | Payer: Medicare Other | Source: Ambulatory Visit | Attending: Ophthalmology | Admitting: Ophthalmology

## 2015-10-31 ENCOUNTER — Encounter (HOSPITAL_COMMUNITY)
Admission: RE | Disposition: A | Discharge status: Home / Self Care | Payer: Self-pay | Source: Ambulatory Visit | Attending: Ophthalmology

## 2015-10-31 DIAGNOSIS — H269 Unspecified cataract: Secondary | ICD-10-CM | POA: Diagnosis not present

## 2015-10-31 DIAGNOSIS — E119 Type 2 diabetes mellitus without complications: Secondary | ICD-10-CM | POA: Insufficient documentation

## 2015-10-31 DIAGNOSIS — Z87891 Personal history of nicotine dependence: Secondary | ICD-10-CM | POA: Insufficient documentation

## 2015-10-31 DIAGNOSIS — I2510 Atherosclerotic heart disease of native coronary artery without angina pectoris: Secondary | ICD-10-CM | POA: Diagnosis not present

## 2015-10-31 DIAGNOSIS — I10 Essential (primary) hypertension: Secondary | ICD-10-CM | POA: Insufficient documentation

## 2015-10-31 DIAGNOSIS — H2512 Age-related nuclear cataract, left eye: Secondary | ICD-10-CM | POA: Insufficient documentation

## 2015-10-31 DIAGNOSIS — Z794 Long term (current) use of insulin: Secondary | ICD-10-CM | POA: Diagnosis not present

## 2015-10-31 HISTORY — DX: Urinary tract infection, site not specified: N39.0

## 2015-10-31 HISTORY — PX: CATARACT EXTRACTION W/PHACO: SHX586

## 2015-10-31 LAB — GLUCOSE, CAPILLARY: Glucose-Capillary: 111 mg/dL — ABNORMAL HIGH (ref 65–99)

## 2015-10-31 SURGERY — PHACOEMULSIFICATION, CATARACT, WITH IOL INSERTION
Anesthesia: Monitor Anesthesia Care | Site: Eye | Laterality: Left

## 2015-10-31 MED ORDER — EPINEPHRINE HCL 1 MG/ML IJ SOLN
INTRAMUSCULAR | Status: AC
  Filled 2015-10-31: qty 1

## 2015-10-31 MED ORDER — FENTANYL CITRATE (PF) 100 MCG/2ML IJ SOLN
25.0000 ug | INTRAMUSCULAR | Status: DC | PRN
  Administered 2015-10-31: 25 ug via INTRAVENOUS

## 2015-10-31 MED ORDER — TETRACAINE HCL 0.5 % OP SOLN
1.0000 [drp] | OPHTHALMIC | Status: AC
  Administered 2015-10-31 (×3): 1 [drp] via OPHTHALMIC

## 2015-10-31 MED ORDER — PROVISC 10 MG/ML IO SOLN
INTRAOCULAR | Status: DC | PRN
  Administered 2015-10-31: 0.85 mL via INTRAOCULAR

## 2015-10-31 MED ORDER — EPINEPHRINE HCL 1 MG/ML IJ SOLN
INTRAOCULAR | Status: DC | PRN
  Administered 2015-10-31: 500 mL

## 2015-10-31 MED ORDER — CYCLOPENTOLATE-PHENYLEPHRINE 0.2-1 % OP SOLN
1.0000 [drp] | OPHTHALMIC | Status: AC
  Administered 2015-10-31 (×3): 1 [drp] via OPHTHALMIC

## 2015-10-31 MED ORDER — LACTATED RINGERS IV SOLN
INTRAVENOUS | Status: DC
  Administered 2015-10-31: 10:00:00 via INTRAVENOUS

## 2015-10-31 MED ORDER — FENTANYL CITRATE (PF) 100 MCG/2ML IJ SOLN
INTRAMUSCULAR | Status: AC
  Filled 2015-10-31: qty 2

## 2015-10-31 MED ORDER — LIDOCAINE HCL 3.5 % OP GEL
1.0000 "application " | Freq: Once | OPHTHALMIC | Status: AC
  Administered 2015-10-31: 1 via OPHTHALMIC

## 2015-10-31 MED ORDER — LIDOCAINE HCL (PF) 1 % IJ SOLN
INTRAOCULAR | Status: DC | PRN
  Administered 2015-10-31: 1 mL via OPHTHALMIC

## 2015-10-31 MED ORDER — MIDAZOLAM HCL 2 MG/2ML IJ SOLN
1.0000 mg | INTRAMUSCULAR | Status: DC | PRN
  Administered 2015-10-31: 2 mg via INTRAVENOUS

## 2015-10-31 MED ORDER — MIDAZOLAM HCL 2 MG/2ML IJ SOLN
INTRAMUSCULAR | Status: AC
  Filled 2015-10-31: qty 2

## 2015-10-31 MED ORDER — NEOMYCIN-POLYMYXIN-DEXAMETH 3.5-10000-0.1 OP SUSP
OPHTHALMIC | Status: DC | PRN
  Administered 2015-10-31: 2 [drp] via OPHTHALMIC

## 2015-10-31 MED ORDER — POVIDONE-IODINE 5 % OP SOLN
OPHTHALMIC | Status: DC | PRN
  Administered 2015-10-31: 1 via OPHTHALMIC

## 2015-10-31 MED ORDER — BSS IO SOLN
INTRAOCULAR | Status: DC | PRN
  Administered 2015-10-31: 15 mL via INTRAOCULAR

## 2015-10-31 MED ORDER — PHENYLEPHRINE HCL 2.5 % OP SOLN
1.0000 [drp] | OPHTHALMIC | Status: AC
  Administered 2015-10-31 (×3): 1 [drp] via OPHTHALMIC

## 2015-10-31 SURGICAL SUPPLY — 25 items
CAPSULAR TENSION RING-AMO (OPHTHALMIC RELATED) IMPLANT
CLOTH BEACON ORANGE TIMEOUT ST (SAFETY) ×1 IMPLANT
EYE SHIELD UNIVERSAL CLEAR (GAUZE/BANDAGES/DRESSINGS) ×1 IMPLANT
GLOVE BIOGEL PI IND STRL 6.5 (GLOVE) IMPLANT
GLOVE BIOGEL PI IND STRL 7.0 (GLOVE) IMPLANT
GLOVE BIOGEL PI IND STRL 7.5 (GLOVE) IMPLANT
GLOVE BIOGEL PI INDICATOR 6.5 (GLOVE) ×1
GLOVE BIOGEL PI INDICATOR 7.0 (GLOVE)
GLOVE BIOGEL PI INDICATOR 7.5 (GLOVE)
GLOVE EXAM NITRILE LRG STRL (GLOVE) IMPLANT
GLOVE EXAM NITRILE MD LF STRL (GLOVE) ×1 IMPLANT
KIT VITRECTOMY (OPHTHALMIC RELATED) IMPLANT
PAD ARMBOARD 7.5X6 YLW CONV (MISCELLANEOUS) ×1 IMPLANT
PROC W NO LENS (INTRAOCULAR LENS)
PROC W SPEC LENS (INTRAOCULAR LENS)
PROCESS W NO LENS (INTRAOCULAR LENS) IMPLANT
PROCESS W SPEC LENS (INTRAOCULAR LENS) IMPLANT
RETRACTOR IRIS SIGHTPATH (OPHTHALMIC RELATED) IMPLANT
RING MALYGIN (MISCELLANEOUS) IMPLANT
SIGHTPATH CAT PROC W REG LENS (Ophthalmic Related) ×2 IMPLANT
SYRINGE LUER LOK 1CC (MISCELLANEOUS) ×1 IMPLANT
TAPE SURG TRANSPORE 1 IN (GAUZE/BANDAGES/DRESSINGS) IMPLANT
TAPE SURGICAL TRANSPORE 1 IN (GAUZE/BANDAGES/DRESSINGS) ×1
VISCOELASTIC ADDITIONAL (OPHTHALMIC RELATED) IMPLANT
WATER STERILE IRR 250ML POUR (IV SOLUTION) ×1 IMPLANT

## 2015-10-31 NOTE — H&P (Signed)
I have reviewed the H&P, the patient was re-examined, and I have identified no interval changes in medical condition and plan of care since the history and physical of record  

## 2015-10-31 NOTE — Anesthesia Postprocedure Evaluation (Signed)
Anesthesia Post Note  Patient: [name] [name]  Procedure(s) Performed: Procedure(s) (LRB): CATARACT EXTRACTION PHACO AND INTRAOCULAR LENS PLACEMENT LEFT EYE; CDE:  9.10 (Left)  Patient location during evaluation: Short Stay Anesthesia Type: MAC Level of consciousness: awake and alert, oriented and patient cooperative Pain management: pain level controlled Vital Signs Assessment: post-procedure vital signs reviewed and stable Respiratory status: spontaneous breathing, nonlabored ventilation and respiratory function stable Cardiovascular status: blood pressure returned to baseline Postop Assessment: no signs of nausea or vomiting Anesthetic complications: no    Last Vitals:  Vitals:   10/31/15 0914  Temp: 36.7 C    Last Pain: There were no vitals filed for this visit.               Nainoa Woldt J

## 2015-10-31 NOTE — Discharge Instructions (Signed)

## 2015-10-31 NOTE — Anesthesia Preprocedure Evaluation (Signed)
Anesthesia Evaluation  Patient identified by MRN, date of birth, ID band Patient awake    Reviewed: Allergy & Precautions, NPO status , Patient's Chart, lab work & pertinent test results  Airway Mallampati: II  TM Distance: >3 FB Neck ROM: Full    Dental  (+) Partial Upper   Pulmonary former smoker,    breath sounds clear to auscultation       Cardiovascular hypertension, Pt. on medications + CAD  + dysrhythmias Atrial Fibrillation  Rhythm:Regular Rate:Normal     Neuro/Psych negative neurological ROS  negative psych ROS   GI/Hepatic negative GI ROS, Neg liver ROS,   Endo/Other  diabetes, Type 2, Insulin Dependent, Oral Hypoglycemic Agents  Renal/GU Renal disease  negative genitourinary   Musculoskeletal negative musculoskeletal ROS (+)   Abdominal   Peds negative pediatric ROS (+)  Hematology negative hematology ROS (+)   Anesthesia Other Findings   Reproductive/Obstetrics negative OB ROS                             Anesthesia Physical Anesthesia Plan  ASA: III  Anesthesia Plan: MAC   Post-op Pain Management:    Induction: Intravenous  Airway Management Planned: Natural Airway  Additional Equipment:   Intra-op Plan:   Post-operative Plan:   Informed Consent: I have reviewed the patients History and Physical, chart, labs and discussed the procedure including the risks, benefits and alternatives for the proposed anesthesia with the patient or authorized representative who has indicated his/her understanding and acceptance.     Plan Discussed with: CRNA  Anesthesia Plan Comments:         Anesthesia Quick Evaluation

## 2015-10-31 NOTE — Transfer of Care (Signed)
Immediate Anesthesia Transfer of Care Note  Patient: [name] [name]  Procedure(s) Performed: Procedure(s): CATARACT EXTRACTION PHACO AND INTRAOCULAR LENS PLACEMENT LEFT EYE; CDE:  9.10 (Left)  Patient Location: Short Stay  Anesthesia Type:MAC  Level of Consciousness: awake, alert , oriented and patient cooperative  Airway & Oxygen Therapy: Patient Spontanous Breathing  Post-op Assessment: Report given to RN, Post -op Vital signs reviewed and stable and Patient moving all extremities  Post vital signs: Reviewed and stable  Last Vitals:  Vitals:   10/31/15 0914  Temp: 36.7 C    Last Pain: There were no vitals filed for this visit.    Patients Stated Pain Goal: 3 ([id number])  Complications: No apparent anesthesia complications

## 2015-10-31 NOTE — Op Note (Signed)
Date of Admission: 10/31/2015  Date of Surgery: 10/31/2015   Pre-Op Dx: Cataract Left Eye  Post-Op Dx: Senile Nuclear Cataract Left  Eye,  Dx Code H25.12  Surgeon: Tonny Branch, M.D.  Assistants: None  Anesthesia: Topical with MAC  Indications: Painless, progressive loss of vision with compromise of daily activities.  Surgery: Cataract Extraction with Intraocular lens Implant Left Eye  Discription: The patient had dilating drops and viscous lidocaine placed into the Left eye in the pre-op holding area. After transfer to the operating room, a time out was performed. The patient was then prepped and draped. Beginning with a 65 degree blade a paracentesis port was made at the surgeon's 2 o'clock position. The anterior chamber was then filled with 1% non-preserved lidocaine. This was followed by filling the anterior chamber with Provisc.  A 2.67mm keratome blade was used to make a clear corneal incision at the temporal limbus.  A bent cystatome needle was used to create a continuous tear capsulotomy. Hydrodissection was performed with balanced salt solution on a Fine canula. The lens nucleus was then removed using the phacoemulsification handpiece. Residual cortex was removed with the I&A handpiece. The anterior chamber and capsular bag were refilled with Provisc. A posterior chamber intraocular lens was placed into the capsular bag with it's injector. The implant was positioned with the Kuglan hook. The Provisc was then removed from the anterior chamber and capsular bag with the I&A handpiece. Stromal hydration of the main incision and paracentesis port was performed with BSS on a Fine canula. The wounds were tested for leak which was negative. The patient tolerated the procedure well. There were no operative complications. The patient was then transferred to the recovery room in stable condition.  Complications: None  Specimen: None  EBL: None  Prosthetic device: Hoya iSert 250, power 21.0 D, SN  [id number].

## 2015-11-06 ENCOUNTER — Encounter (HOSPITAL_COMMUNITY): Payer: Self-pay | Admitting: Ophthalmology

## 2015-11-12 ENCOUNTER — Ambulatory Visit (INDEPENDENT_AMBULATORY_CARE_PROVIDER_SITE_OTHER): Payer: Medicare Other | Admitting: *Deleted

## 2015-11-12 DIAGNOSIS — Z5181 Encounter for therapeutic drug level monitoring: Secondary | ICD-10-CM | POA: Diagnosis not present

## 2015-11-12 DIAGNOSIS — I4891 Unspecified atrial fibrillation: Secondary | ICD-10-CM

## 2015-11-12 DIAGNOSIS — E119 Type 2 diabetes mellitus without complications: Secondary | ICD-10-CM | POA: Diagnosis not present

## 2015-11-12 DIAGNOSIS — I10 Essential (primary) hypertension: Secondary | ICD-10-CM | POA: Diagnosis not present

## 2015-11-12 DIAGNOSIS — Z7901 Long term (current) use of anticoagulants: Secondary | ICD-10-CM

## 2015-11-12 LAB — POCT INR: INR: 1.8

## 2015-11-20 DIAGNOSIS — E1365 Other specified diabetes mellitus with hyperglycemia: Secondary | ICD-10-CM | POA: Diagnosis not present

## 2015-11-20 DIAGNOSIS — I10 Essential (primary) hypertension: Secondary | ICD-10-CM | POA: Diagnosis not present

## 2015-11-20 DIAGNOSIS — N182 Chronic kidney disease, stage 2 (mild): Secondary | ICD-10-CM | POA: Diagnosis not present

## 2015-11-20 DIAGNOSIS — E1322 Other specified diabetes mellitus with diabetic chronic kidney disease: Secondary | ICD-10-CM | POA: Diagnosis not present

## 2015-11-26 DIAGNOSIS — E1142 Type 2 diabetes mellitus with diabetic polyneuropathy: Secondary | ICD-10-CM | POA: Diagnosis not present

## 2015-11-26 DIAGNOSIS — B351 Tinea unguium: Secondary | ICD-10-CM | POA: Diagnosis not present

## 2015-11-26 DIAGNOSIS — L851 Acquired keratosis [keratoderma] palmaris et plantaris: Secondary | ICD-10-CM | POA: Diagnosis not present

## 2015-11-28 ENCOUNTER — Ambulatory Visit (INDEPENDENT_AMBULATORY_CARE_PROVIDER_SITE_OTHER): Payer: Medicare Other | Admitting: Internal Medicine

## 2015-11-28 ENCOUNTER — Encounter (INDEPENDENT_AMBULATORY_CARE_PROVIDER_SITE_OTHER): Payer: Self-pay

## 2015-11-28 ENCOUNTER — Encounter (INDEPENDENT_AMBULATORY_CARE_PROVIDER_SITE_OTHER): Payer: Self-pay | Admitting: Internal Medicine

## 2015-11-28 VITALS — BP 160/80 | HR 72 | Temp 97.7°F | Ht 71.0 in | Wt 289.0 lb

## 2015-11-28 DIAGNOSIS — R195 Other fecal abnormalities: Secondary | ICD-10-CM | POA: Diagnosis not present

## 2015-11-28 LAB — CBC
HEMATOCRIT: 39.6 % (ref 38.5–50.0)
Hemoglobin: 13.7 g/dL (ref 13.2–17.1)
MCH: 30.6 pg (ref 27.0–33.0)
MCHC: 34.6 g/dL (ref 32.0–36.0)
MCV: 88.6 fL (ref 80.0–100.0)
MPV: 9.9 fL (ref 7.5–12.5)
PLATELETS: 163 10*3/uL (ref 140–400)
RBC: 4.47 MIL/uL (ref 4.20–5.80)
RDW: 15.6 % — AB (ref 11.0–15.0)
WBC: 17.7 10*3/uL — AB (ref 3.8–10.8)

## 2015-11-28 NOTE — Patient Instructions (Signed)
CBC and 3 stool cards home with patient.  

## 2015-11-28 NOTE — Progress Notes (Signed)
Subjective:    Patient ID: [name] [name], male    DOB: 1934-08-31, 81 y.o.   MRN: [id number]  HPI Referred by Dr. Manuella Ghazi for blood in stool/colonoscopy. He tells me he had a positive stool card. He does not know if he ate any red meat or had a hard BM. He says he has not seen any blood.  There has been no change in his stools.  Sometimes his stools are soft and sometimes they are hard. There  Has been no weight loss. Appetite is good.  No abdominal pain.  No rectal bleeding.      09/20/2014:   Colonoscopy  Indications:  Patient is 81 year old Caucasian male who has history of colonic adenomas and is returning for surveillance colonoscopy. Family history of CRC in father. His last colonoscopy was in May 2013 with removal of 11 polyps most of which were adenomatous.  Impression:  Examination performed to cecum. Patchy fine nodularity noted to colonic mucosa consistent with lymphoid hyperplasia without changes of colitis. Stool sample sent to the left for C. difficile by PCR. 5 small polyps were cold snared and submitted together(2 at cecum, one each at ascending colon, hepatic flexure and descending colon0. Small external hemorrhoids.  He had 5 small polyps and these are tubular adenomas.     Review of Systems Past Medical History:  Diagnosis Date  . Atrial fibrillation ([place])   . Chronic lymphocytic leukemia ([place])   . Coronary atherosclerosis of native coronary artery    Nonobstructive  . Essential hypertension, benign   . Hyperlipidemia   . IgA nephropathy    Dr. Lowanda Foster  . Type 2 diabetes mellitus ([place])   . UTI (urinary tract infection) july  10 th   taking  med     Past Surgical History:  Procedure Laterality Date  . AMPUTATION Left 09/15/2012   Procedure: PARTIAL AMPUTATION 3rd TOE LEFT FOOT;  Surgeon: Marcheta Grammes, DPM;  Location: [facility];  Service: Orthopedics;  Laterality: Left;  . AMPUTATION Left 04/20/2013   Procedure: PARTIAL AMPUTATION 2ND TOE LEFT  FOOT;  Surgeon: Marcheta Grammes, DPM;  Location: [facility];  Service: Orthopedics;  Laterality: Left;  . ANAL FISSURE REPAIR    . CATARACT EXTRACTION W/PHACO Right 09/30/2015   Procedure: CATARACT EXTRACTION PHACO AND INTRAOCULAR LENS PLACEMENT (IOC);  Surgeon: Tonny Branch, MD;  Location: [facility];  Service: Ophthalmology;  Laterality: Right;  CDE: 11.78  . CATARACT EXTRACTION W/PHACO Left 10/31/2015   Procedure: CATARACT EXTRACTION PHACO AND INTRAOCULAR LENS PLACEMENT LEFT EYE; CDE:  9.10;  Surgeon: Tonny Branch, MD;  Location: [facility];  Service: Ophthalmology;  Laterality: Left;  . COLONOSCOPY  08/19/2011   Procedure: COLONOSCOPY;  Surgeon: Rogene Houston, MD;  Location: [facility];  Service: Endoscopy;  Laterality: N/A;  930  . COLONOSCOPY N/A 09/19/2014   Procedure: COLONOSCOPY;  Surgeon: Rogene Houston, MD;  Location: [facility];  Service: Endoscopy;  Laterality: N/A;  1055  . FEMORAL HERNIA REPAIR    . Open repair left quadriceps tendon.  08/22/07   Dr. Aline Brochure  . Right total knee replacement    . TRANSURETHRAL RESECTION OF PROSTATE      Allergies  Allergen Reactions  . Penicillins Hives    Has patient had a PCN reaction causing immediate rash, facial/tongue/throat swelling, SOB or lightheadedness with hypotension: No Has patient had a PCN reaction causing severe rash involving mucus membranes or skin necrosis: No Has patient had a PCN reaction that  required hospitalization No Has patient had a PCN reaction occurring within the last 10 years: No If all of the above answers are "NO", then may proceed with Cephalosporin use.     Current Outpatient Prescriptions on File Prior to Visit  Medication Sig Dispense Refill  . amLODipine (NORVASC) 5 MG tablet Take 5 mg by mouth every morning.     . Ascorbic Acid (VITAMIN C) 1000 MG tablet Take 1,000 mg by mouth every morning.     . calcium carbonate (OS-CAL) 600 MG TABS Take 600 mg by mouth daily.    . cholecalciferol (VITAMIN D) 1000  UNITS tablet Take 1,000 Units by mouth every morning.    . Coenzyme Q10 (CO Q-10) 100 MG CAPS Take 200 mg by mouth daily.     . digoxin (LANOXIN) 0.25 MG tablet Take 0.125 mcg by mouth every morning. Dosage and instructions not specified    . glimepiride (AMARYL) 4 MG tablet Take 4 mg by mouth 2 (two) times daily.    . hydrochlorothiazide 25 MG tablet Take 25 mg by mouth every morning.     . insulin aspart (NOVOLOG FLEXPEN) 100 UNIT/ML FlexPen Inject 4-18 Units into the skin 3 (three) times daily with meals.     . insulin detemir (LEVEMIR FLEXPEN) 100 UNIT/ML injection Inject 50 Units into the skin daily with breakfast.     . ipratropium (ATROVENT) 0.06 % nasal spray Place 1 spray into both nostrils daily.     . Liraglutide (VICTOZA) 18 MG/3ML SOLN Inject 1.8 mg into the skin every morning. One injection every morning    . lisinopril (PRINIVIL,ZESTRIL) 40 MG tablet Take 40 mg by mouth daily after supper.     Marland Kitchen LORazepam (ATIVAN) 1 MG tablet Take 2 mg by mouth at bedtime.     . Multiple Vitamin (MULITIVITAMIN WITH MINERALS) TABS Take 1 tablet by mouth every morning.    . Omega-3 Fatty Acids (FISH OIL) 1200 MG CAPS Take 1,200 mg by mouth 2 (two) times daily.     . pioglitazone (ACTOS) 30 MG tablet Take 30 mg by mouth daily.    . vitamin E 400 UNIT capsule Take 400 Units by mouth every morning.     . warfarin (COUMADIN) 5 MG tablet Take 1 to 1.5 tablets by mouth daily as directed by coumadin clinic (Patient taking differently: Take 5 mg by mouth daily at 6 PM. 5mg  daily.) 105 tablet 3  . acetaminophen (TYLENOL) 650 MG CR tablet Take 650 mg by mouth every 8 (eight) hours as needed for pain.    . ciclopirox (LOPROX) 0.77 % cream Apply 1 application topically 2 (two) times daily.     No current facility-administered medications on file prior to visit.        Objective:   Physical Exam Blood pressure (!) 160/80, pulse 72, temperature 97.7 F (36.5 C), height 5\' 11"  (1.803 m), weight 289 lb (131.1  kg). Alert and oriented. Skin warm and dry. Oral mucosa is moist.   . Sclera anicteric, conjunctivae is pink. Thyroid not enlarged. No cervical lymphadenopathy. Lungs clear. Heart regular rate and rhythm.  Abdomen is soft. Bowel sounds are positive. No hepatomegaly. No abdominal masses felt. No tenderness.  No edema to lower extremities.          Assessment & Plan:  Guaiac positive stool. Hx of tubular adenoma.  I discussed with Dr. Pollyann Kennedy. CBC today and 3 stool cards home with patient.

## 2015-12-03 ENCOUNTER — Ambulatory Visit (INDEPENDENT_AMBULATORY_CARE_PROVIDER_SITE_OTHER): Payer: Medicare Other | Admitting: *Deleted

## 2015-12-03 DIAGNOSIS — I4891 Unspecified atrial fibrillation: Secondary | ICD-10-CM | POA: Diagnosis not present

## 2015-12-03 DIAGNOSIS — Z5181 Encounter for therapeutic drug level monitoring: Secondary | ICD-10-CM | POA: Diagnosis not present

## 2015-12-03 DIAGNOSIS — Z7901 Long term (current) use of anticoagulants: Secondary | ICD-10-CM

## 2015-12-03 LAB — POCT INR: INR: 3.3

## 2015-12-10 ENCOUNTER — Encounter (INDEPENDENT_AMBULATORY_CARE_PROVIDER_SITE_OTHER): Payer: Self-pay | Admitting: Internal Medicine

## 2015-12-10 ENCOUNTER — Other Ambulatory Visit (INDEPENDENT_AMBULATORY_CARE_PROVIDER_SITE_OTHER): Payer: Self-pay | Admitting: Internal Medicine

## 2015-12-10 DIAGNOSIS — K921 Melena: Secondary | ICD-10-CM

## 2015-12-10 LAB — POC HEMOCCULT BLD/STL (HOME/3-CARD/SCREEN)
Card #3 Fecal Occult Blood, POC: NEGATIVE
FECAL OCCULT BLD: POSITIVE
Fecal Occult Blood, POC: POSITIVE — AB

## 2015-12-13 ENCOUNTER — Telehealth (INDEPENDENT_AMBULATORY_CARE_PROVIDER_SITE_OTHER): Payer: Self-pay | Admitting: Internal Medicine

## 2015-12-13 DIAGNOSIS — E1142 Type 2 diabetes mellitus with diabetic polyneuropathy: Secondary | ICD-10-CM | POA: Diagnosis not present

## 2015-12-13 NOTE — Telephone Encounter (Signed)
Dr. Laural Golden. We discussed this case. Patient referred for positive stool card.  I got a CBC and he turned in 3 stool cards. Two were positive. What next? Last colonoscopy in 2016.  CBC    Component Value Date/Time   WBC 17.7 (H) 11/28/2015 0956   RBC 4.47 11/28/2015 0956   HGB 13.7 11/28/2015 0956   HCT 39.6 11/28/2015 0956   PLT 163 11/28/2015 0956   MCV 88.6 11/28/2015 0956   MCH 30.6 11/28/2015 0956   MCHC 34.6 11/28/2015 0956   RDW 15.6 (H) 11/28/2015 [id number]

## 2015-12-13 NOTE — Progress Notes (Signed)
Results given to patient. I sent a note to Dr. Laural Golden

## 2015-12-16 ENCOUNTER — Telehealth (INDEPENDENT_AMBULATORY_CARE_PROVIDER_SITE_OTHER): Payer: Self-pay | Admitting: Internal Medicine

## 2015-12-16 ENCOUNTER — Other Ambulatory Visit (INDEPENDENT_AMBULATORY_CARE_PROVIDER_SITE_OTHER): Payer: Self-pay | Admitting: *Deleted

## 2015-12-16 DIAGNOSIS — Z6841 Body Mass Index (BMI) 40.0 and over, adult: Secondary | ICD-10-CM | POA: Diagnosis not present

## 2015-12-16 DIAGNOSIS — E1142 Type 2 diabetes mellitus with diabetic polyneuropathy: Secondary | ICD-10-CM | POA: Diagnosis not present

## 2015-12-16 DIAGNOSIS — R195 Other fecal abnormalities: Secondary | ICD-10-CM

## 2015-12-16 DIAGNOSIS — N390 Urinary tract infection, site not specified: Secondary | ICD-10-CM | POA: Diagnosis not present

## 2015-12-16 DIAGNOSIS — R350 Frequency of micturition: Secondary | ICD-10-CM | POA: Diagnosis not present

## 2015-12-16 NOTE — Telephone Encounter (Signed)
Dr. Olevia Perches recommendations given to wife .Repeat CBC in 3 months and we are going to watch.    Tammy, CBC in 3 months

## 2015-12-16 NOTE — Telephone Encounter (Signed)
CBC is noted for 3 months.

## 2015-12-16 NOTE — Telephone Encounter (Signed)
Since patient has no symptoms and his H&H is normal would recommend repeating CBC and Hemoccult in 3 months.

## 2015-12-24 ENCOUNTER — Ambulatory Visit (INDEPENDENT_AMBULATORY_CARE_PROVIDER_SITE_OTHER): Payer: Medicare Other | Admitting: *Deleted

## 2015-12-24 DIAGNOSIS — I4891 Unspecified atrial fibrillation: Secondary | ICD-10-CM | POA: Diagnosis not present

## 2015-12-24 DIAGNOSIS — Z5181 Encounter for therapeutic drug level monitoring: Secondary | ICD-10-CM

## 2015-12-24 DIAGNOSIS — Z7901 Long term (current) use of anticoagulants: Secondary | ICD-10-CM

## 2015-12-24 LAB — POCT INR: INR: 3

## 2015-12-27 DIAGNOSIS — N3090 Cystitis, unspecified without hematuria: Secondary | ICD-10-CM | POA: Diagnosis not present

## 2015-12-27 DIAGNOSIS — N400 Enlarged prostate without lower urinary tract symptoms: Secondary | ICD-10-CM | POA: Diagnosis not present

## 2015-12-27 DIAGNOSIS — R351 Nocturia: Secondary | ICD-10-CM | POA: Diagnosis not present

## 2015-12-27 DIAGNOSIS — R350 Frequency of micturition: Secondary | ICD-10-CM | POA: Diagnosis not present

## 2016-01-20 DIAGNOSIS — N401 Enlarged prostate with lower urinary tract symptoms: Secondary | ICD-10-CM | POA: Diagnosis not present

## 2016-01-20 DIAGNOSIS — R350 Frequency of micturition: Secondary | ICD-10-CM | POA: Diagnosis not present

## 2016-01-21 ENCOUNTER — Ambulatory Visit (INDEPENDENT_AMBULATORY_CARE_PROVIDER_SITE_OTHER): Payer: Medicare Other | Admitting: *Deleted

## 2016-01-21 DIAGNOSIS — Z5181 Encounter for therapeutic drug level monitoring: Secondary | ICD-10-CM | POA: Diagnosis not present

## 2016-01-21 DIAGNOSIS — Z7901 Long term (current) use of anticoagulants: Secondary | ICD-10-CM

## 2016-01-21 DIAGNOSIS — Z6841 Body Mass Index (BMI) 40.0 and over, adult: Secondary | ICD-10-CM | POA: Diagnosis not present

## 2016-01-21 DIAGNOSIS — Z87891 Personal history of nicotine dependence: Secondary | ICD-10-CM | POA: Diagnosis not present

## 2016-01-21 DIAGNOSIS — E1129 Type 2 diabetes mellitus with other diabetic kidney complication: Secondary | ICD-10-CM | POA: Diagnosis not present

## 2016-01-21 DIAGNOSIS — I4891 Unspecified atrial fibrillation: Secondary | ICD-10-CM | POA: Diagnosis not present

## 2016-01-21 DIAGNOSIS — J069 Acute upper respiratory infection, unspecified: Secondary | ICD-10-CM | POA: Diagnosis not present

## 2016-01-21 DIAGNOSIS — Z299 Encounter for prophylactic measures, unspecified: Secondary | ICD-10-CM | POA: Diagnosis not present

## 2016-01-21 LAB — POCT INR: INR: 2.2

## 2016-01-21 MED ORDER — WARFARIN SODIUM 5 MG PO TABS: ORAL_TABLET | ORAL | 3 refills | Status: DC

## 2016-01-21 NOTE — Progress Notes (Signed)
Cardiology Office Note  Date: 01/22/2016   ID: [name] [name], DOB 11/30/1934, MRN [id number]  PCP: [name] Blitz, MD  Primary Cardiologist: [name] Lesches, MD   Chief Complaint  Patient presents with  . Atrial Fibrillation    History of Present Illness: [name] [name] is a 81 y.o. male last seen in April. He presents for a routine follow-up visit. Reports no problems with palpitations or chest pain. He has had a recent cold, saw Dr. Manuella [name]. No other major health changes other than cataract surgery.  He continues on Coumadin with follow-up in the anticoagulation clinic. He is due for a visit next week.  I reviewed his medications. Regimen includes Norvasc, Lipitor, Lanoxin, lisinopril, and Coumadin.  Past Medical History:  Diagnosis Date  . Atrial fibrillation ([place])   . Chronic lymphocytic leukemia ([name] [name])   . Coronary atherosclerosis of native coronary artery    Nonobstructive  . Essential hypertension, benign   . Hyperlipidemia   . IgA nephropathy    Dr. Lowanda [name]  . Type 2 diabetes mellitus ([place])   . UTI (urinary tract infection) july  10 th   taking  med     Current Outpatient Prescriptions  Medication Sig Dispense Refill  . acetaminophen (TYLENOL) 650 MG CR tablet Take 650 mg by mouth every 8 (eight) hours as needed for pain.    Marland Kitchen amLODipine (NORVASC) 5 MG tablet Take 5 mg by mouth every morning.     . Ascorbic Acid (VITAMIN C) 1000 MG tablet Take 1,000 mg by mouth every morning.     Marland Kitchen atorvastatin (LIPITOR) 40 MG tablet Take 40 mg by mouth daily.    . calcium carbonate (OS-CAL) 600 MG TABS Take 600 mg by mouth daily.    . cholecalciferol (VITAMIN D) 1000 UNITS tablet Take 1,000 Units by mouth every morning.    . ciclopirox (LOPROX) 0.77 % cream Apply 1 application topically 2 (two) times daily.    . Coenzyme Q10 (CO Q-10) 100 MG CAPS Take 200 mg by mouth daily.     . digoxin (LANOXIN) 0.25 MG tablet Take 0.125 mcg by mouth every morning. Dosage and instructions not  specified    . glimepiride (AMARYL) 4 MG tablet Take 4 mg by mouth 2 (two) times daily.    . hydrochlorothiazide 25 MG tablet Take 25 mg by mouth every morning.     . insulin aspart (NOVOLOG FLEXPEN) 100 UNIT/ML FlexPen Inject 4-18 Units into the skin 3 (three) times daily with meals.     . insulin detemir (LEVEMIR FLEXPEN) 100 UNIT/ML injection Inject 50 Units into the skin daily with breakfast.     . ipratropium (ATROVENT) 0.06 % nasal spray Place 1 spray into both nostrils daily.     Marland Kitchen levofloxacin (LEVAQUIN) 500 MG tablet Take 500 mg by mouth daily. 10 days starting 01/22/16    . Liraglutide (VICTOZA) 18 MG/3ML SOLN Inject 1.8 mg into the skin every morning. One injection every morning    . lisinopril (PRINIVIL,ZESTRIL) 40 MG tablet Take 40 mg by mouth daily after supper.     Marland Kitchen LORazepam (ATIVAN) 1 MG tablet Take 2 mg by mouth at bedtime.     . Multiple Vitamin (MULITIVITAMIN WITH MINERALS) TABS Take 1 tablet by mouth every morning.    . Omega-3 Fatty Acids (FISH OIL) 1200 MG CAPS Take 1,200 mg by mouth 2 (two) times daily.     . pioglitazone (ACTOS) 30 MG tablet Take 30 mg by mouth daily.    Marland Kitchen  prednisoLONE 5 MG TABS tablet Take 5 mg by mouth. 21 tabs - starting 01/22/16    . vitamin E 400 UNIT capsule Take 400 Units by mouth every morning.     . warfarin (COUMADIN) 5 MG tablet Take 1 to 1.5 tablets by mouth daily as directed by coumadin clinic 105 tablet 3   No current facility-administered medications for this visit.    Allergies:  Penicillins   Social History: The patient  reports that he quit smoking about 52 years ago. His smoking use included Cigarettes. He quit after 10.00 years of use. He has never used smokeless tobacco. He reports that he does not drink alcohol or use drugs.   ROS:  Please see the history of present illness. Otherwise, complete review of systems is positive for decreased hearing.  All other systems are reviewed and negative.   Physical Exam: VS:  BP 132/60    Pulse 89   Ht 6' (1.829 m)   Wt 295 lb (133.8 kg)   SpO2 94%   BMI 40.01 kg/m , BMI Body mass index is 40.01 kg/m.  Wt Readings from Last 3 Encounters:  01/22/16 295 lb (133.8 kg)  11/28/15 289 lb (131.1 kg)  10/31/15 296 lb (134.3 kg)    Obese male, appears comfortable at rest.  HEENT: Conjunctiva and lids are normal, oropharynx with moist mucosa.  Neck: Supple, no elevated JVP or carotid bruits, no thyromegaly.  Lungs: Clear auscultation, nonlabored.  Cardiac: Irregularly irregular, 2/6 systolic murmur at the base, preserved second heart sound, no rub.  Abdomen: Obese, protuberant, bowel sounds present, nontender.  Skin: Warm and dry.   ECG: I personally reviewed the tracing from 07/26/2015 which showed rate-controlled atrial fibrillation with right bundle branch block and left anterior fascicular block.  Recent Labwork: 09/25/2015: BUN 29; Creatinine, Ser 0.88; Potassium 3.9; Sodium 136 11/28/2015: Hemoglobin 13.7; Platelets 163   Other Studies Reviewed Today:  Echocardiogram July 2013: Mild to moderate LVH with LVEF greater than 65%, moderate left atrial enlargement, mild mitral regurgitation, trace aortic regurgitation, mildly dilated right ventricle and moderately dilated right atrium, RVSP 40 mmHg.  Assessment and Plan:  1. Chronic atrial fibrillation. Asymptomatic in terms of palpitations. Plan to continue current medical regimen including Coumadin with follow-up in anticoagulation clinic.  2. Essential hypertension, blood pressure is adequate control today.  Current medicines were reviewed with the patient today.   Disposition: Follow-up with me in 6 months.  Signed, [name] Sark, MD, [facility] 01/22/2016 1:09 PM    [facility] [place], [place], [place] [address] Phone: [phone]; Fax: [phone]

## 2016-01-22 ENCOUNTER — Encounter: Payer: Self-pay | Admitting: Cardiology

## 2016-01-22 ENCOUNTER — Ambulatory Visit (INDEPENDENT_AMBULATORY_CARE_PROVIDER_SITE_OTHER): Payer: Medicare Other | Admitting: Cardiology

## 2016-01-22 VITALS — BP 132/60 | HR 89 | Ht 72.0 in | Wt 295.0 lb

## 2016-01-22 DIAGNOSIS — I10 Essential (primary) hypertension: Secondary | ICD-10-CM | POA: Diagnosis not present

## 2016-01-22 DIAGNOSIS — I482 Chronic atrial fibrillation, unspecified: Secondary | ICD-10-CM

## 2016-01-22 NOTE — Patient Instructions (Signed)

## 2016-01-30 ENCOUNTER — Ambulatory Visit (INDEPENDENT_AMBULATORY_CARE_PROVIDER_SITE_OTHER): Payer: Medicare Other | Admitting: *Deleted

## 2016-01-30 DIAGNOSIS — Z7901 Long term (current) use of anticoagulants: Secondary | ICD-10-CM | POA: Diagnosis not present

## 2016-01-30 DIAGNOSIS — I4891 Unspecified atrial fibrillation: Secondary | ICD-10-CM | POA: Diagnosis not present

## 2016-01-30 DIAGNOSIS — Z5181 Encounter for therapeutic drug level monitoring: Secondary | ICD-10-CM

## 2016-01-30 LAB — POCT INR: INR: 2

## 2016-02-03 DIAGNOSIS — I10 Essential (primary) hypertension: Secondary | ICD-10-CM | POA: Diagnosis not present

## 2016-02-03 DIAGNOSIS — E119 Type 2 diabetes mellitus without complications: Secondary | ICD-10-CM | POA: Diagnosis not present

## 2016-02-03 DIAGNOSIS — I4891 Unspecified atrial fibrillation: Secondary | ICD-10-CM | POA: Diagnosis not present

## 2016-02-04 DIAGNOSIS — L851 Acquired keratosis [keratoderma] palmaris et plantaris: Secondary | ICD-10-CM | POA: Diagnosis not present

## 2016-02-04 DIAGNOSIS — B351 Tinea unguium: Secondary | ICD-10-CM | POA: Diagnosis not present

## 2016-02-04 DIAGNOSIS — E1142 Type 2 diabetes mellitus with diabetic polyneuropathy: Secondary | ICD-10-CM | POA: Diagnosis not present

## 2016-02-05 DIAGNOSIS — Z961 Presence of intraocular lens: Secondary | ICD-10-CM | POA: Diagnosis not present

## 2016-02-05 DIAGNOSIS — H26493 Other secondary cataract, bilateral: Secondary | ICD-10-CM | POA: Diagnosis not present

## 2016-02-11 DIAGNOSIS — Z23 Encounter for immunization: Secondary | ICD-10-CM | POA: Diagnosis not present

## 2016-02-24 ENCOUNTER — Encounter (INDEPENDENT_AMBULATORY_CARE_PROVIDER_SITE_OTHER): Payer: Self-pay | Admitting: *Deleted

## 2016-02-24 ENCOUNTER — Other Ambulatory Visit (INDEPENDENT_AMBULATORY_CARE_PROVIDER_SITE_OTHER): Payer: Self-pay | Admitting: *Deleted

## 2016-02-24 DIAGNOSIS — R195 Other fecal abnormalities: Secondary | ICD-10-CM

## 2016-02-25 ENCOUNTER — Ambulatory Visit (INDEPENDENT_AMBULATORY_CARE_PROVIDER_SITE_OTHER): Payer: Medicare Other | Admitting: *Deleted

## 2016-02-25 DIAGNOSIS — I4891 Unspecified atrial fibrillation: Secondary | ICD-10-CM

## 2016-02-25 DIAGNOSIS — Z5181 Encounter for therapeutic drug level monitoring: Secondary | ICD-10-CM | POA: Diagnosis not present

## 2016-02-25 DIAGNOSIS — Z7901 Long term (current) use of anticoagulants: Secondary | ICD-10-CM | POA: Diagnosis not present

## 2016-02-25 LAB — POCT INR: INR: 3.2

## 2016-03-03 DIAGNOSIS — I4891 Unspecified atrial fibrillation: Secondary | ICD-10-CM | POA: Diagnosis not present

## 2016-03-03 DIAGNOSIS — I10 Essential (primary) hypertension: Secondary | ICD-10-CM | POA: Diagnosis not present

## 2016-03-03 DIAGNOSIS — E119 Type 2 diabetes mellitus without complications: Secondary | ICD-10-CM | POA: Diagnosis not present

## 2016-03-04 DIAGNOSIS — N182 Chronic kidney disease, stage 2 (mild): Secondary | ICD-10-CM | POA: Diagnosis not present

## 2016-03-04 DIAGNOSIS — I10 Essential (primary) hypertension: Secondary | ICD-10-CM | POA: Diagnosis not present

## 2016-03-04 DIAGNOSIS — Z299 Encounter for prophylactic measures, unspecified: Secondary | ICD-10-CM | POA: Diagnosis not present

## 2016-03-04 DIAGNOSIS — E1129 Type 2 diabetes mellitus with other diabetic kidney complication: Secondary | ICD-10-CM | POA: Diagnosis not present

## 2016-03-04 DIAGNOSIS — E7800 Pure hypercholesterolemia, unspecified: Secondary | ICD-10-CM | POA: Diagnosis not present

## 2016-03-16 DIAGNOSIS — R195 Other fecal abnormalities: Secondary | ICD-10-CM | POA: Diagnosis not present

## 2016-03-17 LAB — CBC
HCT: 41.4 % (ref 38.5–50.0)
Hemoglobin: 13.8 g/dL (ref 13.2–17.1)
MCH: 29.9 pg (ref 27.0–33.0)
MCHC: 33.3 g/dL (ref 32.0–36.0)
MCV: 89.6 fL (ref 80.0–100.0)
MPV: 9.6 fL (ref 7.5–12.5)
PLATELETS: 178 10*3/uL (ref 140–400)
RBC: 4.62 MIL/uL (ref 4.20–5.80)
RDW: 15.3 % — AB (ref 11.0–15.0)
WBC: 15.6 10*3/uL — AB (ref 3.8–10.8)

## 2016-03-18 ENCOUNTER — Other Ambulatory Visit (INDEPENDENT_AMBULATORY_CARE_PROVIDER_SITE_OTHER): Payer: Self-pay | Admitting: *Deleted

## 2016-03-18 DIAGNOSIS — R195 Other fecal abnormalities: Secondary | ICD-10-CM

## 2016-03-19 DIAGNOSIS — Z299 Encounter for prophylactic measures, unspecified: Secondary | ICD-10-CM | POA: Diagnosis not present

## 2016-03-19 DIAGNOSIS — I10 Essential (primary) hypertension: Secondary | ICD-10-CM | POA: Diagnosis not present

## 2016-03-19 DIAGNOSIS — J069 Acute upper respiratory infection, unspecified: Secondary | ICD-10-CM | POA: Diagnosis not present

## 2016-03-19 DIAGNOSIS — N182 Chronic kidney disease, stage 2 (mild): Secondary | ICD-10-CM | POA: Diagnosis not present

## 2016-03-19 DIAGNOSIS — I2510 Atherosclerotic heart disease of native coronary artery without angina pectoris: Secondary | ICD-10-CM | POA: Diagnosis not present

## 2016-03-20 DIAGNOSIS — I10 Essential (primary) hypertension: Secondary | ICD-10-CM | POA: Diagnosis not present

## 2016-03-20 DIAGNOSIS — I4891 Unspecified atrial fibrillation: Secondary | ICD-10-CM | POA: Diagnosis not present

## 2016-03-20 DIAGNOSIS — E119 Type 2 diabetes mellitus without complications: Secondary | ICD-10-CM | POA: Diagnosis not present

## 2016-03-24 ENCOUNTER — Ambulatory Visit (INDEPENDENT_AMBULATORY_CARE_PROVIDER_SITE_OTHER): Payer: Medicare Other | Admitting: *Deleted

## 2016-03-24 DIAGNOSIS — Z7901 Long term (current) use of anticoagulants: Secondary | ICD-10-CM | POA: Diagnosis not present

## 2016-03-24 DIAGNOSIS — Z5181 Encounter for therapeutic drug level monitoring: Secondary | ICD-10-CM | POA: Diagnosis not present

## 2016-03-24 DIAGNOSIS — I4891 Unspecified atrial fibrillation: Secondary | ICD-10-CM

## 2016-03-24 LAB — POCT INR: INR: 3.3

## 2016-03-25 DIAGNOSIS — J309 Allergic rhinitis, unspecified: Secondary | ICD-10-CM | POA: Diagnosis not present

## 2016-03-25 DIAGNOSIS — I4891 Unspecified atrial fibrillation: Secondary | ICD-10-CM | POA: Diagnosis not present

## 2016-03-25 DIAGNOSIS — Z6841 Body Mass Index (BMI) 40.0 and over, adult: Secondary | ICD-10-CM | POA: Diagnosis not present

## 2016-03-25 DIAGNOSIS — Z299 Encounter for prophylactic measures, unspecified: Secondary | ICD-10-CM | POA: Diagnosis not present

## 2016-03-25 DIAGNOSIS — Z87891 Personal history of nicotine dependence: Secondary | ICD-10-CM | POA: Diagnosis not present

## 2016-03-25 DIAGNOSIS — E1129 Type 2 diabetes mellitus with other diabetic kidney complication: Secondary | ICD-10-CM | POA: Diagnosis not present

## 2016-04-14 ENCOUNTER — Ambulatory Visit (INDEPENDENT_AMBULATORY_CARE_PROVIDER_SITE_OTHER): Payer: Medicare Other | Admitting: *Deleted

## 2016-04-14 DIAGNOSIS — I4891 Unspecified atrial fibrillation: Secondary | ICD-10-CM | POA: Diagnosis not present

## 2016-04-14 DIAGNOSIS — E1142 Type 2 diabetes mellitus with diabetic polyneuropathy: Secondary | ICD-10-CM | POA: Diagnosis not present

## 2016-04-14 DIAGNOSIS — Z7901 Long term (current) use of anticoagulants: Secondary | ICD-10-CM

## 2016-04-14 DIAGNOSIS — Z5181 Encounter for therapeutic drug level monitoring: Secondary | ICD-10-CM

## 2016-04-14 DIAGNOSIS — L851 Acquired keratosis [keratoderma] palmaris et plantaris: Secondary | ICD-10-CM | POA: Diagnosis not present

## 2016-04-14 DIAGNOSIS — B351 Tinea unguium: Secondary | ICD-10-CM | POA: Diagnosis not present

## 2016-04-14 LAB — POCT INR: INR: 2.4

## 2016-04-16 ENCOUNTER — Ambulatory Visit (INDEPENDENT_AMBULATORY_CARE_PROVIDER_SITE_OTHER): Payer: Medicare Other | Admitting: Otolaryngology

## 2016-04-16 DIAGNOSIS — H903 Sensorineural hearing loss, bilateral: Secondary | ICD-10-CM | POA: Diagnosis not present

## 2016-04-16 DIAGNOSIS — J310 Chronic rhinitis: Secondary | ICD-10-CM

## 2016-05-12 ENCOUNTER — Ambulatory Visit (INDEPENDENT_AMBULATORY_CARE_PROVIDER_SITE_OTHER): Payer: Medicare Other | Admitting: *Deleted

## 2016-05-12 DIAGNOSIS — Z7901 Long term (current) use of anticoagulants: Secondary | ICD-10-CM | POA: Diagnosis not present

## 2016-05-12 DIAGNOSIS — I4891 Unspecified atrial fibrillation: Secondary | ICD-10-CM

## 2016-05-12 DIAGNOSIS — Z5181 Encounter for therapeutic drug level monitoring: Secondary | ICD-10-CM

## 2016-05-12 LAB — POCT INR: INR: 2.9

## 2016-06-01 ENCOUNTER — Encounter (INDEPENDENT_AMBULATORY_CARE_PROVIDER_SITE_OTHER): Payer: Self-pay | Admitting: *Deleted

## 2016-06-01 ENCOUNTER — Other Ambulatory Visit (INDEPENDENT_AMBULATORY_CARE_PROVIDER_SITE_OTHER): Payer: Self-pay | Admitting: *Deleted

## 2016-06-01 DIAGNOSIS — R195 Other fecal abnormalities: Secondary | ICD-10-CM

## 2016-06-09 ENCOUNTER — Ambulatory Visit (INDEPENDENT_AMBULATORY_CARE_PROVIDER_SITE_OTHER): Payer: Medicare Other | Admitting: *Deleted

## 2016-06-09 DIAGNOSIS — Z7901 Long term (current) use of anticoagulants: Secondary | ICD-10-CM

## 2016-06-09 DIAGNOSIS — I4891 Unspecified atrial fibrillation: Secondary | ICD-10-CM | POA: Diagnosis not present

## 2016-06-09 DIAGNOSIS — Z5181 Encounter for therapeutic drug level monitoring: Secondary | ICD-10-CM | POA: Diagnosis not present

## 2016-06-09 LAB — POCT INR: INR: 2.8

## 2016-06-11 DIAGNOSIS — E1129 Type 2 diabetes mellitus with other diabetic kidney complication: Secondary | ICD-10-CM | POA: Diagnosis not present

## 2016-06-11 DIAGNOSIS — N182 Chronic kidney disease, stage 2 (mild): Secondary | ICD-10-CM | POA: Diagnosis not present

## 2016-06-11 DIAGNOSIS — E1365 Other specified diabetes mellitus with hyperglycemia: Secondary | ICD-10-CM | POA: Diagnosis not present

## 2016-06-11 DIAGNOSIS — E1322 Other specified diabetes mellitus with diabetic chronic kidney disease: Secondary | ICD-10-CM | POA: Diagnosis not present

## 2016-06-11 DIAGNOSIS — E7800 Pure hypercholesterolemia, unspecified: Secondary | ICD-10-CM | POA: Diagnosis not present

## 2016-06-11 DIAGNOSIS — E1142 Type 2 diabetes mellitus with diabetic polyneuropathy: Secondary | ICD-10-CM | POA: Diagnosis not present

## 2016-06-11 DIAGNOSIS — I4891 Unspecified atrial fibrillation: Secondary | ICD-10-CM | POA: Diagnosis not present

## 2016-06-11 DIAGNOSIS — Z6841 Body Mass Index (BMI) 40.0 and over, adult: Secondary | ICD-10-CM | POA: Diagnosis not present

## 2016-06-11 DIAGNOSIS — Z87891 Personal history of nicotine dependence: Secondary | ICD-10-CM | POA: Diagnosis not present

## 2016-06-11 DIAGNOSIS — Z299 Encounter for prophylactic measures, unspecified: Secondary | ICD-10-CM | POA: Diagnosis not present

## 2016-06-11 DIAGNOSIS — I10 Essential (primary) hypertension: Secondary | ICD-10-CM | POA: Diagnosis not present

## 2016-06-11 DIAGNOSIS — C9191 Lymphoid leukemia, unspecified, in remission: Secondary | ICD-10-CM | POA: Diagnosis not present

## 2016-06-16 DIAGNOSIS — R195 Other fecal abnormalities: Secondary | ICD-10-CM | POA: Diagnosis not present

## 2016-06-16 LAB — CBC
HCT: 44 % (ref 38.5–50.0)
Hemoglobin: 14.3 g/dL (ref 13.2–17.1)
MCH: 29.5 pg (ref 27.0–33.0)
MCHC: 32.5 g/dL (ref 32.0–36.0)
MCV: 90.7 fL (ref 80.0–100.0)
MPV: 10.1 fL (ref 7.5–12.5)
PLATELETS: 199 10*3/uL (ref 140–400)
RBC: 4.85 MIL/uL (ref 4.20–5.80)
RDW: 15 % (ref 11.0–15.0)
WBC: 15.9 10*3/uL — ABNORMAL HIGH (ref 3.8–10.8)

## 2016-06-18 ENCOUNTER — Other Ambulatory Visit (INDEPENDENT_AMBULATORY_CARE_PROVIDER_SITE_OTHER): Payer: Self-pay | Admitting: *Deleted

## 2016-06-18 DIAGNOSIS — K921 Melena: Secondary | ICD-10-CM

## 2016-06-20 ENCOUNTER — Encounter (HOSPITAL_COMMUNITY): Payer: Self-pay | Admitting: *Deleted

## 2016-06-20 ENCOUNTER — Emergency Department (HOSPITAL_COMMUNITY): Payer: Medicare Other

## 2016-06-20 ENCOUNTER — Inpatient Hospital Stay (HOSPITAL_COMMUNITY)
Admission: EM | Admit: 2016-06-20 | Discharge: 2016-06-25 | DRG: 871 | Disposition: A | Discharge status: Home / Self Care | Payer: Medicare Other | Attending: Internal Medicine | Admitting: Internal Medicine

## 2016-06-20 DIAGNOSIS — E162 Hypoglycemia, unspecified: Secondary | ICD-10-CM | POA: Diagnosis present

## 2016-06-20 DIAGNOSIS — A419 Sepsis, unspecified organism: Secondary | ICD-10-CM

## 2016-06-20 DIAGNOSIS — I119 Hypertensive heart disease without heart failure: Secondary | ICD-10-CM | POA: Diagnosis present

## 2016-06-20 DIAGNOSIS — E119 Type 2 diabetes mellitus without complications: Secondary | ICD-10-CM

## 2016-06-20 DIAGNOSIS — N501 Vascular disorders of male genital organs: Secondary | ICD-10-CM | POA: Diagnosis not present

## 2016-06-20 DIAGNOSIS — Z9841 Cataract extraction status, right eye: Secondary | ICD-10-CM

## 2016-06-20 DIAGNOSIS — G9341 Metabolic encephalopathy: Secondary | ICD-10-CM | POA: Diagnosis present

## 2016-06-20 DIAGNOSIS — Z961 Presence of intraocular lens: Secondary | ICD-10-CM | POA: Diagnosis present

## 2016-06-20 DIAGNOSIS — B9620 Unspecified Escherichia coli [E. coli] as the cause of diseases classified elsewhere: Secondary | ICD-10-CM | POA: Diagnosis present

## 2016-06-20 DIAGNOSIS — E11649 Type 2 diabetes mellitus with hypoglycemia without coma: Secondary | ICD-10-CM | POA: Diagnosis present

## 2016-06-20 DIAGNOSIS — R310 Gross hematuria: Secondary | ICD-10-CM

## 2016-06-20 DIAGNOSIS — A4150 Gram-negative sepsis, unspecified: Principal | ICD-10-CM | POA: Diagnosis present

## 2016-06-20 DIAGNOSIS — R6883 Chills (without fever): Secondary | ICD-10-CM | POA: Diagnosis not present

## 2016-06-20 DIAGNOSIS — Z880 Allergy status to penicillin: Secondary | ICD-10-CM

## 2016-06-20 DIAGNOSIS — Z79899 Other long term (current) drug therapy: Secondary | ICD-10-CM

## 2016-06-20 DIAGNOSIS — I700 Atherosclerosis of aorta: Secondary | ICD-10-CM | POA: Diagnosis not present

## 2016-06-20 DIAGNOSIS — Z7901 Long term (current) use of anticoagulants: Secondary | ICD-10-CM

## 2016-06-20 DIAGNOSIS — N390 Urinary tract infection, site not specified: Secondary | ICD-10-CM | POA: Diagnosis not present

## 2016-06-20 DIAGNOSIS — R531 Weakness: Secondary | ICD-10-CM | POA: Diagnosis not present

## 2016-06-20 DIAGNOSIS — Z9842 Cataract extraction status, left eye: Secondary | ICD-10-CM

## 2016-06-20 DIAGNOSIS — E785 Hyperlipidemia, unspecified: Secondary | ICD-10-CM | POA: Diagnosis present

## 2016-06-20 DIAGNOSIS — R404 Transient alteration of awareness: Secondary | ICD-10-CM | POA: Diagnosis not present

## 2016-06-20 DIAGNOSIS — E118 Type 2 diabetes mellitus with unspecified complications: Secondary | ICD-10-CM

## 2016-06-20 DIAGNOSIS — I482 Chronic atrial fibrillation: Secondary | ICD-10-CM | POA: Diagnosis not present

## 2016-06-20 DIAGNOSIS — Z794 Long term (current) use of insulin: Secondary | ICD-10-CM

## 2016-06-20 DIAGNOSIS — C9110 Chronic lymphocytic leukemia of B-cell type not having achieved remission: Secondary | ICD-10-CM | POA: Diagnosis not present

## 2016-06-20 DIAGNOSIS — I2510 Atherosclerotic heart disease of native coronary artery without angina pectoris: Secondary | ICD-10-CM | POA: Diagnosis present

## 2016-06-20 DIAGNOSIS — I517 Cardiomegaly: Secondary | ICD-10-CM | POA: Diagnosis present

## 2016-06-20 DIAGNOSIS — R4182 Altered mental status, unspecified: Secondary | ICD-10-CM | POA: Diagnosis not present

## 2016-06-20 DIAGNOSIS — I4891 Unspecified atrial fibrillation: Secondary | ICD-10-CM | POA: Diagnosis present

## 2016-06-20 LAB — CBC WITH DIFFERENTIAL/PLATELET
Basophils Absolute: 0 10*3/uL (ref 0.0–0.1)
Basophils Relative: 0 %
EOS PCT: 0 %
Eosinophils Absolute: 0 10*3/uL (ref 0.0–0.7)
HCT: 43.8 % (ref 39.0–52.0)
HEMOGLOBIN: 14.9 g/dL (ref 13.0–17.0)
LYMPHS ABS: 3.8 10*3/uL (ref 0.7–4.0)
LYMPHS PCT: 31 %
MCH: 30.2 pg (ref 26.0–34.0)
MCHC: 34 g/dL (ref 30.0–36.0)
MCV: 88.8 fL (ref 78.0–100.0)
MONOS PCT: 1 %
Monocytes Absolute: 0.1 10*3/uL (ref 0.1–1.0)
Neutro Abs: 8.3 10*3/uL — ABNORMAL HIGH (ref 1.7–7.7)
Neutrophils Relative %: 68 %
PLATELETS: 141 10*3/uL — AB (ref 150–400)
RBC: 4.93 MIL/uL (ref 4.22–5.81)
RDW: 15.2 % (ref 11.5–15.5)
WBC: 12.2 10*3/uL — AB (ref 4.0–10.5)

## 2016-06-20 LAB — COMPREHENSIVE METABOLIC PANEL
ALT: 21 U/L (ref 17–63)
AST: 41 U/L (ref 15–41)
Albumin: 3.7 g/dL (ref 3.5–5.0)
Alkaline Phosphatase: 122 U/L (ref 38–126)
Anion gap: 10 (ref 5–15)
BUN: 26 mg/dL — AB (ref 6–20)
CO2: 27 mmol/L (ref 22–32)
CREATININE: 1.09 mg/dL (ref 0.61–1.24)
Calcium: 10 mg/dL (ref 8.9–10.3)
Chloride: 100 mmol/L — ABNORMAL LOW (ref 101–111)
GFR calc Af Amer: >60 mL/min (ref >60)
GLUCOSE: 62 mg/dL — AB (ref 65–99)
POTASSIUM: 3.3 mmol/L — AB (ref 3.5–5.1)
SODIUM: 137 mmol/L (ref 135–145)
Total Bilirubin: 2.3 mg/dL — ABNORMAL HIGH (ref 0.3–1.2)
Total Protein: 6.4 g/dL — ABNORMAL LOW (ref 6.5–8.1)

## 2016-06-20 LAB — I-STAT CG4 LACTIC ACID, ED: LACTIC ACID, VENOUS: 3.67 mmol/L — AB (ref 0.5–1.9)

## 2016-06-20 LAB — CBG MONITORING, ED: GLUCOSE-CAPILLARY: 70 mg/dL (ref 65–99)

## 2016-06-20 MED ORDER — DEXTROSE 50 % IV SOLN
25.0000 mL | Freq: Once | INTRAVENOUS | Status: AC
  Administered 2016-06-20: 25 mL via INTRAVENOUS

## 2016-06-20 MED ORDER — SODIUM CHLORIDE 0.9 % IV BOLUS (SEPSIS)
1000.0000 mL | Freq: Once | INTRAVENOUS | Status: AC
  Administered 2016-06-20: 1000 mL via INTRAVENOUS

## 2016-06-20 MED ORDER — SODIUM CHLORIDE 0.9 % IV BOLUS (SEPSIS): 1000.0000 mL | Freq: Once | INTRAVENOUS | Status: AC

## 2016-06-20 MED ORDER — DEXTROSE 5 % IV SOLN
2.0000 g | Freq: Once | INTRAVENOUS | Status: AC
  Administered 2016-06-20: 2 g via INTRAVENOUS
  Filled 2016-06-20: qty 2

## 2016-06-20 MED ORDER — DEXTROSE 5 % IV SOLN
2.0000 g | Freq: Once | INTRAVENOUS | Status: DC
  Filled 2016-06-20: qty 2

## 2016-06-20 MED ORDER — LEVOFLOXACIN IN D5W 750 MG/150ML IV SOLN
750.0000 mg | Freq: Once | INTRAVENOUS | Status: AC
  Administered 2016-06-20: 750 mg via INTRAVENOUS
  Filled 2016-06-20: qty 150

## 2016-06-20 MED ORDER — VANCOMYCIN HCL IN DEXTROSE 1-5 GM/200ML-% IV SOLN
1000.0000 mg | Freq: Once | INTRAVENOUS | Status: AC
  Administered 2016-06-20: 1000 mg via INTRAVENOUS
  Filled 2016-06-20: qty 200

## 2016-06-20 MED ORDER — DEXTROSE 50 % IV SOLN
INTRAVENOUS | Status: AC
  Filled 2016-06-20: qty 50

## 2016-06-20 NOTE — ED Notes (Signed)
Dr Yelverton in to assess 

## 2016-06-20 NOTE — ED Triage Notes (Signed)
Pt c/o bleeding from penis, ems reports that wife sent pt to er for /uti symptoms, was given a dose of cipro that was his brother's by spouse before ems arrival to residence,

## 2016-06-20 NOTE — ED Provider Notes (Signed)
[facility] DEPT Provider Note   CSN: [id number] Arrival date & time: 06/20/16  2158   By signing my name below, I, Eunice Blase, attest that this documentation has been prepared under the direction and in the presence of Julianne Rice, MD. Electronically signed, Eunice Blase, ED Scribe. 06/20/16. 10:20 PM.   History   Chief Complaint Chief Complaint  Patient presents with  . Urinary Tract Infection   The history is provided by medical records. The history is limited by the condition of the patient. No language interpreter was used.    LEVEL 5 CAVEAT D/T ALTERED MENTAL STATUS  HPI Comments: [name] [name] is a 82 y.o. male BIB EMS who presents to the Emergency Department for bloody penile discharge. Pt reportedly sent to [facility] ED this evening by his wife to rule out UTI. Pt further reportedly given 1 dose of ciprofloxin that belonged to a family member prior to EMS arrival.   Past Medical History:  Diagnosis Date  . Atrial fibrillation ([place])   . Chronic lymphocytic leukemia ([place])   . Coronary atherosclerosis of native coronary artery    Nonobstructive  . Essential hypertension, benign   . Hyperlipidemia   . IgA nephropathy    Dr. Lowanda Foster  . Type 2 diabetes mellitus ([place])   . UTI (urinary tract infection) july  10 th   taking  med     Patient Active Problem List   Diagnosis Date Noted  . Sepsis ([place]) 06/21/2016  . Hypoglycemia 06/21/2016  . Acute metabolic encephalopathy 64/33/2951  . Type 2 diabetes mellitus ([place])   . Chronic lymphocytic leukemia ([place])   . Encounter for therapeutic drug monitoring 05/16/2013  . Cardiac murmur 10/21/2011  . Long term current use of anticoagulant therapy 06/27/2010  . Mixed hyperlipidemia 05/21/2008  . CORONARY ATHEROSCLEROSIS NATIVE CORONARY ARTERY 05/21/2008  . Atrial fibrillation ([place]) 05/21/2008  . DIABETES 06/14/2007  . KNEE PAIN 06/14/2007    Past Surgical History:  Procedure Laterality Date  . AMPUTATION Left  09/15/2012   Procedure: PARTIAL AMPUTATION 3rd TOE LEFT FOOT;  Surgeon: Marcheta Grammes, DPM;  Location: [facility] ORS;  Service: Orthopedics;  Laterality: Left;  . AMPUTATION Left 04/20/2013   Procedure: PARTIAL AMPUTATION 2ND TOE LEFT FOOT;  Surgeon: Marcheta Grammes, DPM;  Location: [facility];  Service: Orthopedics;  Laterality: Left;  . ANAL FISSURE REPAIR    . CATARACT EXTRACTION W/PHACO Right 09/30/2015   Procedure: CATARACT EXTRACTION PHACO AND INTRAOCULAR LENS PLACEMENT (IOC);  Surgeon: Tonny Branch, MD;  Location: [facility];  Service: Ophthalmology;  Laterality: Right;  CDE: 11.78  . CATARACT EXTRACTION W/PHACO Left 10/31/2015   Procedure: CATARACT EXTRACTION PHACO AND INTRAOCULAR LENS PLACEMENT LEFT EYE; CDE:  9.10;  Surgeon: Tonny Branch, MD;  Location: [facility];  Service: Ophthalmology;  Laterality: Left;  . COLONOSCOPY  08/19/2011   Procedure: COLONOSCOPY;  Surgeon: Rogene Houston, MD;  Location: [facility] END[facility] SU[facility];  Service: Endoscopy;  Laterality: N/A;  930  . COLONOSCOPY N/A 09/19/2014   Procedure: COLONOSCOPY;  Surgeon: Rogene Houston, MD;  Location: [facility];  Service: Endoscopy;  Laterality: N/A;  1055  . FEMORAL HERNIA REPAIR    . Open repair left quadriceps tendon.  08/22/07   Dr. Aline Brochure  . Right total knee replacement    . TRANSURETHRAL RESECTION OF PROSTATE         Home Medications    Prior to Admission medications   Medication Sig Start Date End Date Taking? Authorizing Provider  acetaminophen (  TYLENOL) 650 MG CR tablet Take 650 mg by mouth every 8 (eight) hours as needed for pain.   Yes Historical Provider, MD  amLODipine (NORVASC) 5 MG tablet Take 5 mg by mouth daily.    Yes Historical Provider, MD  Ascorbic Acid (VITAMIN C) 1000 MG tablet Take 1,000 mg by mouth daily.    Yes Historical Provider, MD  atorvastatin (LIPITOR) 10 MG tablet Take 10 mg by mouth every evening.   Yes Historical Provider, MD  Calcium Carbonate-Vitamin D (CALCIUM 600+D) 600-400 MG-UNIT tablet  Take 1 tablet by mouth daily.   Yes Historical Provider, MD  cholecalciferol (VITAMIN D) 1000 UNITS tablet Take 1,000 Units by mouth daily.    Yes Historical Provider, MD  ciclopirox (LOPROX) 0.77 % cream Apply 1 application topically daily as needed (for rash on foot).   Yes Historical Provider, MD  Coenzyme Q10 (CO Q-10) 100 MG CAPS Take 200 mg by mouth daily.    Yes Historical Provider, MD  digoxin (LANOXIN) 0.125 MG tablet Take 0.125 mg by mouth daily.   Yes Historical Provider, MD  glimepiride (AMARYL) 4 MG tablet Take 4 mg by mouth 2 (two) times daily.   Yes Historical Provider, MD  hydrochlorothiazide 25 MG tablet Take 25 mg by mouth daily.    Yes Historical Provider, MD  insulin aspart (NOVOLOG FLEXPEN) 100 UNIT/ML FlexPen Inject 4-18 Units into the skin 2 (two) times daily before a meal. Pt uses per sliding scale before lunch and dinner.   Yes Historical Provider, MD  insulin detemir (LEVEMIR FLEXPEN) 100 UNIT/ML injection Inject 50 Units into the skin daily.    Yes Historical Provider, MD  Liraglutide (VICTOZA) 18 MG/3ML SOLN Inject 1.8 mg into the skin daily.    Yes Historical Provider, MD  lisinopril (PRINIVIL,ZESTRIL) 40 MG tablet Take 40 mg by mouth every evening.    Yes Historical Provider, MD  LORazepam (ATIVAN) 1 MG tablet Take 2 mg by mouth at bedtime.    Yes Historical Provider, MD  Multiple Vitamin (MULITIVITAMIN WITH MINERALS) TABS Take 1 tablet by mouth daily.    Yes Historical Provider, MD  Omega-3 Fatty Acids (FISH OIL) 1200 MG CAPS Take 1,200 mg by mouth 2 (two) times daily.    Yes Historical Provider, MD  pioglitazone (ACTOS) 30 MG tablet Take 30 mg by mouth every evening.    Yes Historical Provider, MD  vitamin E 400 UNIT capsule Take 400 Units by mouth daily.    Yes Historical Provider, MD  warfarin (COUMADIN) 5 MG tablet Take 5 mg by mouth every evening.   Yes Historical Provider, MD    Family History Family History  Problem Relation Age of Onset  . Atrial  fibrillation Mother     Social History Social History  Substance Use Topics  . Smoking status: Former Smoker    Years: 10.00    Types: Cigarettes    Quit date: 04/07/1963  . Smokeless tobacco: Never Used  . Alcohol use No     Allergies   Penicillins   Review of Systems Review of Systems  Unable to perform ROS: Mental status change     Physical Exam Updated Vital Signs BP (!) 129/54 (BP Location: Right Arm)   Pulse (!) 109   Temp (!) 101.1 F (38.4 C) (Oral)   Resp (!) 28   Ht 5\' 11"  (1.803 m)   Wt 295 lb (133.8 kg)   SpO2 94%   BMI 41.14 kg/m   Physical Exam  Constitutional: He  appears well-developed and well-nourished. He appears distressed.  Lethargic  HENT:  Head: Normocephalic and atraumatic.  Mouth/Throat: Oropharynx is clear and moist.  Dry mucous membranes  Eyes: EOM are normal. Pupils are equal, round, and reactive to light.  3 mm and sluggish  Neck: Normal range of motion. Neck supple.  Cardiovascular: Regular rhythm.   Tachycardia  Pulmonary/Chest: Effort normal and breath sounds normal.  Shallow breathing. Tachypnea. diminished breath sounds in bilateral bases.  Abdominal: Soft. Bowel sounds are normal. There is no tenderness. There is no rebound and no guarding.  Obese abdomen. Reducible umbilical hernia  Genitourinary:  Genitourinary Comments: Uncircumcised penis. Small amount of blood around the urethral meatus. No obvious injury.  Musculoskeletal: Normal range of motion. He exhibits no edema or tenderness.  Neurological:  Lethargic. Will awaken to voice. Appears to be moving all extremities.  Skin: Skin is warm and dry. Capillary refill takes less than 2 seconds. No rash noted. No erythema.  Nursing note and vitals reviewed.    ED Treatments / Results  DIAGNOSTIC STUDIES: Oxygen Saturation is 94% on RA, normal by my interpretation.    COORDINATION OF CARE: 10:20 PM Discussed treatment plan with pt at bedside and pt agreed to plan.   Labs (all labs ordered are listed, but only abnormal results are displayed) Labs Reviewed  CULTURE, BLOOD (ROUTINE X 2) - Abnormal; Notable for the following:       Result Value   Culture ESCHERICHIA COLI (*)    All other components within normal limits  URINE CULTURE - Abnormal; Notable for the following:    Culture >=100,000 COLONIES/mL ESCHERICHIA COLI (*)    Organism ID, Bacteria ESCHERICHIA COLI (*)    All other components within normal limits  BLOOD CULTURE ID PANEL (REFLEXED) - Abnormal; Notable for the following:    Enterobacteriaceae species DETECTED (*)    Escherichia coli DETECTED (*)    All other components within normal limits  COMPREHENSIVE METABOLIC PANEL - Abnormal; Notable for the following:    Potassium 3.3 (*)    Chloride 100 (*)    Glucose, Bld 62 (*)    BUN 26 (*)    Total Protein 6.4 (*)    Total Bilirubin 2.3 (*)    All other components within normal limits  CBC WITH DIFFERENTIAL/PLATELET - Abnormal; Notable for the following:    WBC 12.2 (*)    Platelets 141 (*)    Neutro Abs 8.3 (*)    All other components within normal limits  URINALYSIS, ROUTINE W REFLEX MICROSCOPIC - Abnormal; Notable for the following:    Color, Urine AMBER (*)    APPearance HAZY (*)    Hgb urine dipstick SMALL (*)    Bacteria, UA RARE (*)    All other components within normal limits  PROTIME-INR - Abnormal; Notable for the following:    Prothrombin Time 27.9 (*)    All other components within normal limits  LACTIC ACID, PLASMA - Abnormal; Notable for the following:    Lactic Acid, Venous 3.1 (*)    All other components within normal limits  LACTIC ACID, PLASMA - Abnormal; Notable for the following:    Lactic Acid, Venous 3.6 (*)    All other components within normal limits  DIGOXIN LEVEL - Abnormal; Notable for the following:    Digoxin Level 0.3 (*)    All other components within normal limits  BASIC METABOLIC PANEL - Abnormal; Notable for the following:    Sodium 134 (*)  Potassium 3.4 (*)    Glucose, Bld 137 (*)    BUN 26 (*)    Calcium 8.6 (*)    GFR calc non Af Amer 59 (*)    All other components within normal limits  CBC - Abnormal; Notable for the following:    WBC 21.8 (*)    Platelets 122 (*)    All other components within normal limits  GLUCOSE, CAPILLARY - Abnormal; Notable for the following:    Glucose-Capillary 105 (*)    All other components within normal limits  GLUCOSE, CAPILLARY - Abnormal; Notable for the following:    Glucose-Capillary 122 (*)    All other components within normal limits  GLUCOSE, CAPILLARY - Abnormal; Notable for the following:    Glucose-Capillary 128 (*)    All other components within normal limits  GLUCOSE, CAPILLARY - Abnormal; Notable for the following:    Glucose-Capillary 138 (*)    All other components within normal limits  PROTIME-INR - Abnormal; Notable for the following:    Prothrombin Time 32.1 (*)    All other components within normal limits  GLUCOSE, CAPILLARY - Abnormal; Notable for the following:    Glucose-Capillary 229 (*)    All other components within normal limits  GLUCOSE, CAPILLARY - Abnormal; Notable for the following:    Glucose-Capillary 283 (*)    All other components within normal limits  BASIC METABOLIC PANEL - Abnormal; Notable for the following:    Potassium 3.1 (*)    Glucose, Bld 172 (*)    BUN 38 (*)    Calcium 8.6 (*)    All other components within normal limits  CBC - Abnormal; Notable for the following:    WBC 22.7 (*)    RBC 4.09 (*)    Hemoglobin 12.4 (*)    HCT 35.9 (*)    Platelets 110 (*)    All other components within normal limits  PROTIME-INR - Abnormal; Notable for the following:    Prothrombin Time 24.1 (*)    All other components within normal limits  GLUCOSE, CAPILLARY - Abnormal; Notable for the following:    Glucose-Capillary 229 (*)    All other components within normal limits  GLUCOSE, CAPILLARY - Abnormal; Notable for the following:     Glucose-Capillary 172 (*)    All other components within normal limits  GLUCOSE, CAPILLARY - Abnormal; Notable for the following:    Glucose-Capillary 157 (*)    All other components within normal limits  MAGNESIUM - Abnormal; Notable for the following:    Magnesium 1.4 (*)    All other components within normal limits  HEMOGLOBIN A1C - Abnormal; Notable for the following:    Hgb A1c MFr Bld 7.7 (*)    All other components within normal limits  GLUCOSE, CAPILLARY - Abnormal; Notable for the following:    Glucose-Capillary 302 (*)    All other components within normal limits  GLUCOSE, CAPILLARY - Abnormal; Notable for the following:    Glucose-Capillary 286 (*)    All other components within normal limits  BASIC METABOLIC PANEL - Abnormal; Notable for the following:    Glucose, Bld 178 (*)    BUN 30 (*)    Calcium 8.5 (*)    All other components within normal limits  CBC - Abnormal; Notable for the following:    WBC 15.2 (*)    Hemoglobin 12.7 (*)    HCT 37.4 (*)    RDW 15.6 (*)    Platelets 112 (*)  All other components within normal limits  PROTIME-INR - Abnormal; Notable for the following:    Prothrombin Time 20.0 (*)    All other components within normal limits  MAGNESIUM - Abnormal; Notable for the following:    Magnesium 1.6 (*)    All other components within normal limits  GLUCOSE, CAPILLARY - Abnormal; Notable for the following:    Glucose-Capillary 220 (*)    All other components within normal limits  GLUCOSE, CAPILLARY - Abnormal; Notable for the following:    Glucose-Capillary 193 (*)    All other components within normal limits  GLUCOSE, CAPILLARY - Abnormal; Notable for the following:    Glucose-Capillary 303 (*)    All other components within normal limits  GLUCOSE, CAPILLARY - Abnormal; Notable for the following:    Glucose-Capillary 245 (*)    All other components within normal limits  BASIC METABOLIC PANEL - Abnormal; Notable for the following:     Glucose, Bld 201 (*)    BUN 22 (*)    Calcium 8.3 (*)    All other components within normal limits  CBC - Abnormal; Notable for the following:    WBC 14.1 (*)    Hemoglobin 12.8 (*)    HCT 37.5 (*)    Platelets 113 (*)    All other components within normal limits  PROTIME-INR - Abnormal; Notable for the following:    Prothrombin Time 21.9 (*)    All other components within normal limits  GLUCOSE, CAPILLARY - Abnormal; Notable for the following:    Glucose-Capillary 238 (*)    All other components within normal limits  GLUCOSE, CAPILLARY - Abnormal; Notable for the following:    Glucose-Capillary 163 (*)    All other components within normal limits  GLUCOSE, CAPILLARY - Abnormal; Notable for the following:    Glucose-Capillary 187 (*)    All other components within normal limits  GLUCOSE, CAPILLARY - Abnormal; Notable for the following:    Glucose-Capillary 311 (*)    All other components within normal limits  GLUCOSE, CAPILLARY - Abnormal; Notable for the following:    Glucose-Capillary 248 (*)    All other components within normal limits  I-STAT CG4 LACTIC ACID, ED - Abnormal; Notable for the following:    Lactic Acid, Venous 3.67 (*)    All other components within normal limits  I-STAT CG4 LACTIC ACID, ED - Abnormal; Notable for the following:    Lactic Acid, Venous 3.66 (*)    All other components within normal limits  CBG MONITORING, ED - Abnormal; Notable for the following:    Glucose-Capillary 114 (*)    All other components within normal limits  CULTURE, BLOOD (ROUTINE X 2)  LACTIC ACID, PLASMA  PROTIME-INR  CBG MONITORING, ED    EKG  EKG Interpretation None       Radiology US Renal  Result Date: 06/23/2016 CLINICAL DATA:  Gross hematuria and urinary tract infections EXAM: RENAL / URINARY TRACT ULTRASOUND COMPLETE COMPARISON:  None. FINDINGS: Right Kidney: Length: 13.6 cm. Echogenicity and renal cortical thickness are within normal limits. No perinephric  fluid or hydronephrosis visualized. There is a cyst arising from the upper pole the right kidney measuring 5.3 x 3.9 x 4.3 cm. A cyst arising from the lower pole of the right kidney measures 2.3 x 2.6 x 2.7 cm. No sonographically demonstrable calculus or ureterectasis. Left Kidney: Length: 13.2 cm. Echogenicity and renal cortical thickness are within normal limits. No perinephric fluid or hydronephrosis visualized. There is a  cyst arising from the upper pole of the left kidney measuring 2.6 x 2.4 x 3.7 cm. There is a nearby cyst measuring 1.9 x 1.7 x 1.7 cm. No sonographically demonstrable calculus or ureterectasis. Bladder: There is irregular debris noted in the urinary bladder. Urinary bladder wall appears mildly thickened. Flow from the distal ureters is seen in the bladder. The patient was unable to void. IMPRESSION: Complex debris noted within the urinary bladder with mild urinary bladder wall thickening. Suspect cystitis. Note that patient was unable to void; a finding concerning for bladder outlet obstruction. Etiology for bladder outlet obstruction uncertain. Cysts noted in each kidney.  No obstructing focus in either kidney. Electronically Signed   By: Lowella Grip III M.D.   On: 06/23/2016 11:18    Procedures Procedures (including critical care time)  Medications Ordered in ED Medications  digoxin (LANOXIN) tablet 0.125 mg (0.125 mg Oral Given 06/24/16 1044)  acetaminophen (TYLENOL) tablet 650 mg (650 mg Oral Given 06/22/16 0149)    Or  acetaminophen (TYLENOL) suppository 650 mg ( Rectal See Alternative 06/22/16 0149)  insulin aspart (novoLOG) injection 0-9 Units (3 Units Subcutaneous Given 06/24/16 1718)  0.9 % NaCl with KCl 20 mEq/ L  infusion ( Intravenous Stopped 06/22/16 1713)  Warfarin - Pharmacist Dosing Inpatient ( Does not apply Given 06/24/16 1600)  insulin aspart (novoLOG) injection 8 Units (8 Units Subcutaneous Given 06/24/16 1718)  insulin glargine (LANTUS) injection 12 Units (12  Units Subcutaneous Given 06/23/16 2112)  cefTRIAXone (ROCEPHIN) 2 g in dextrose 5 % 50 mL IVPB (2 g Intravenous Given 06/24/16 1044)  atorvastatin (LIPITOR) tablet 10 mg (10 mg Oral Given 06/24/16 1718)  amLODipine (NORVASC) tablet 5 mg (5 mg Oral Given 06/24/16 1718)  lisinopril (PRINIVIL,ZESTRIL) tablet 40 mg (40 mg Oral Given 06/24/16 1718)  LORazepam (ATIVAN) tablet 2 mg (not administered)  levofloxacin (LEVAQUIN) IVPB 750 mg (0 mg Intravenous Stopped 06/21/16 0031)  aztreonam (AZACTAM) 2 g in dextrose 5 % 50 mL IVPB (0 g Intravenous Stopped 06/20/16 2334)  vancomycin (VANCOCIN) IVPB 1000 mg/200 mL premix (0 mg Intravenous Stopped 06/21/16 0016)  sodium chloride 0.9 % bolus 1,000 mL (1,000 mLs Intravenous Given by EMS 06/20/16 2233)  dextrose 50 % solution 25 mL (25 mLs Intravenous Given 06/20/16 2300)  vancomycin (VANCOCIN) IVPB 1000 mg/200 mL premix (0 mg Intravenous Stopped 06/21/16 0121)  sodium chloride 0.9 % bolus 1,000 mL (0 mLs Intravenous Stopped 06/21/16 0016)  warfarin (COUMADIN) tablet 2.5 mg (2.5 mg Oral Given 06/21/16 1702)  potassium chloride SA (K-DUR,KLOR-CON) CR tablet 60 mEq (60 mEq Oral Given 06/22/16 0917)  warfarin (COUMADIN) tablet 5 mg (5 mg Oral Given 06/22/16 1712)  warfarin (COUMADIN) tablet 6 mg (6 mg Oral Given 06/23/16 1256)  warfarin (COUMADIN) tablet 5 mg (5 mg Oral Given 06/24/16 1718)   CRITICAL CARE Performed by: Lita Mains, Camryn Lampson Total critical care time: 35 minutes Critical care time was exclusive of separately billable procedures and treating other patients. Critical care was necessary to treat or prevent imminent or life-threatening deterioration. Critical care was time spent personally by me on the following activities: development of treatment plan with patient and/or surrogate as well as nursing, discussions with consultants, evaluation of patient's response to treatment, examination of patient, obtaining history from patient or surrogate, ordering and performing  treatments and interventions, ordering and review of laboratory studies, ordering and review of radiographic studies, pulse oximetry and re-evaluation of patient's condition.  Initial Impression / Assessment and Plan / ED Course  I have  reviewed the triage vital signs and the nursing notes.  Pertinent labs & imaging results that were available during my care of the patient were reviewed by me and considered in my medical decision making (see chart for details).     I personally performed the services described in this documentation, which was scribed in my presence. The recorded information has been reviewed and is accurate.   Patient appears to be septic likely from urinary source.  Patient noted to be hypoglycemic. Given D50. Also given IV fluids and antibiotics cover for urinary sepsis. Patient with improved mentation. Vital signs remained stable. Given stable vital signs and concern for pulmonary edema, only 2 L of IV fluids were given in the emergency department. Discuss with hospitalist and will admit.  Final Clinical Impressions(s) / ED Diagnoses   Final diagnoses:  Sepsis due to urinary tract infection [facility])    New Prescriptions Current Discharge Medication List       Julianne Rice, MD 06/24/16 2118

## 2016-06-21 ENCOUNTER — Encounter (HOSPITAL_COMMUNITY): Payer: Self-pay | Admitting: Family Medicine

## 2016-06-21 DIAGNOSIS — Z7901 Long term (current) use of anticoagulants: Secondary | ICD-10-CM | POA: Diagnosis not present

## 2016-06-21 DIAGNOSIS — A4151 Sepsis due to Escherichia coli [E. coli]: Secondary | ICD-10-CM | POA: Diagnosis not present

## 2016-06-21 DIAGNOSIS — R310 Gross hematuria: Secondary | ICD-10-CM | POA: Diagnosis not present

## 2016-06-21 DIAGNOSIS — E162 Hypoglycemia, unspecified: Secondary | ICD-10-CM | POA: Diagnosis present

## 2016-06-21 DIAGNOSIS — I119 Hypertensive heart disease without heart failure: Secondary | ICD-10-CM | POA: Diagnosis present

## 2016-06-21 DIAGNOSIS — Z9841 Cataract extraction status, right eye: Secondary | ICD-10-CM | POA: Diagnosis not present

## 2016-06-21 DIAGNOSIS — E785 Hyperlipidemia, unspecified: Secondary | ICD-10-CM | POA: Diagnosis present

## 2016-06-21 DIAGNOSIS — Z880 Allergy status to penicillin: Secondary | ICD-10-CM | POA: Diagnosis not present

## 2016-06-21 DIAGNOSIS — Z794 Long term (current) use of insulin: Secondary | ICD-10-CM | POA: Diagnosis not present

## 2016-06-21 DIAGNOSIS — A419 Sepsis, unspecified organism: Secondary | ICD-10-CM | POA: Diagnosis not present

## 2016-06-21 DIAGNOSIS — G9341 Metabolic encephalopathy: Secondary | ICD-10-CM | POA: Diagnosis not present

## 2016-06-21 DIAGNOSIS — C9110 Chronic lymphocytic leukemia of B-cell type not having achieved remission: Secondary | ICD-10-CM | POA: Diagnosis not present

## 2016-06-21 DIAGNOSIS — Z79899 Other long term (current) drug therapy: Secondary | ICD-10-CM | POA: Diagnosis not present

## 2016-06-21 DIAGNOSIS — N281 Cyst of kidney, acquired: Secondary | ICD-10-CM | POA: Diagnosis not present

## 2016-06-21 DIAGNOSIS — A4150 Gram-negative sepsis, unspecified: Secondary | ICD-10-CM | POA: Diagnosis present

## 2016-06-21 DIAGNOSIS — I517 Cardiomegaly: Secondary | ICD-10-CM | POA: Diagnosis present

## 2016-06-21 DIAGNOSIS — R531 Weakness: Secondary | ICD-10-CM | POA: Diagnosis not present

## 2016-06-21 DIAGNOSIS — I2510 Atherosclerotic heart disease of native coronary artery without angina pectoris: Secondary | ICD-10-CM | POA: Diagnosis not present

## 2016-06-21 DIAGNOSIS — N390 Urinary tract infection, site not specified: Secondary | ICD-10-CM | POA: Diagnosis not present

## 2016-06-21 DIAGNOSIS — B9620 Unspecified Escherichia coli [E. coli] as the cause of diseases classified elsewhere: Secondary | ICD-10-CM | POA: Diagnosis not present

## 2016-06-21 DIAGNOSIS — I4891 Unspecified atrial fibrillation: Secondary | ICD-10-CM | POA: Diagnosis not present

## 2016-06-21 DIAGNOSIS — Z961 Presence of intraocular lens: Secondary | ICD-10-CM | POA: Diagnosis present

## 2016-06-21 DIAGNOSIS — E118 Type 2 diabetes mellitus with unspecified complications: Secondary | ICD-10-CM | POA: Diagnosis not present

## 2016-06-21 DIAGNOSIS — E11649 Type 2 diabetes mellitus with hypoglycemia without coma: Secondary | ICD-10-CM | POA: Diagnosis present

## 2016-06-21 DIAGNOSIS — Z9842 Cataract extraction status, left eye: Secondary | ICD-10-CM | POA: Diagnosis not present

## 2016-06-21 LAB — CBC
HEMATOCRIT: 39.3 % (ref 39.0–52.0)
Hemoglobin: 13.1 g/dL (ref 13.0–17.0)
MCH: 30 pg (ref 26.0–34.0)
MCHC: 33.3 g/dL (ref 30.0–36.0)
MCV: 90.1 fL (ref 78.0–100.0)
Platelets: 122 10*3/uL — ABNORMAL LOW (ref 150–400)
RBC: 4.36 MIL/uL (ref 4.22–5.81)
RDW: 15.2 % (ref 11.5–15.5)
WBC: 21.8 10*3/uL — AB (ref 4.0–10.5)

## 2016-06-21 LAB — I-STAT CG4 LACTIC ACID, ED: LACTIC ACID, VENOUS: 3.66 mmol/L — AB (ref 0.5–1.9)

## 2016-06-21 LAB — BASIC METABOLIC PANEL
Anion gap: 10 (ref 5–15)
BUN: 26 mg/dL — AB (ref 6–20)
CALCIUM: 8.6 mg/dL — AB (ref 8.9–10.3)
CO2: 22 mmol/L (ref 22–32)
CREATININE: 1.15 mg/dL (ref 0.61–1.24)
Chloride: 102 mmol/L (ref 101–111)
GFR calc Af Amer: >60 mL/min (ref >60)
GFR, EST NON AFRICAN AMERICAN: 59 mL/min — AB (ref >60)
Glucose, Bld: 137 mg/dL — ABNORMAL HIGH (ref 65–99)
POTASSIUM: 3.4 mmol/L — AB (ref 3.5–5.1)
SODIUM: 134 mmol/L — AB (ref 135–145)

## 2016-06-21 LAB — URINALYSIS, ROUTINE W REFLEX MICROSCOPIC
Bilirubin Urine: NEGATIVE
Glucose, UA: NEGATIVE mg/dL
Ketones, ur: NEGATIVE mg/dL
Leukocytes, UA: NEGATIVE
Nitrite: NEGATIVE
PH: 5 (ref 5.0–8.0)
Protein, ur: NEGATIVE mg/dL
Specific Gravity, Urine: 1.01 (ref 1.005–1.030)

## 2016-06-21 LAB — PROTIME-INR
INR: 2.55
INR: 3.04
Prothrombin Time: 27.9 seconds — ABNORMAL HIGH (ref 11.4–15.2)
Prothrombin Time: 32.1 seconds — ABNORMAL HIGH (ref 11.4–15.2)

## 2016-06-21 LAB — GLUCOSE, CAPILLARY
GLUCOSE-CAPILLARY: 122 mg/dL — AB (ref 65–99)
GLUCOSE-CAPILLARY: 128 mg/dL — AB (ref 65–99)
GLUCOSE-CAPILLARY: 138 mg/dL — AB (ref 65–99)
GLUCOSE-CAPILLARY: 283 mg/dL — AB (ref 65–99)
Glucose-Capillary: 105 mg/dL — ABNORMAL HIGH (ref 65–99)
Glucose-Capillary: 229 mg/dL — ABNORMAL HIGH (ref 65–99)
Glucose-Capillary: 229 mg/dL — ABNORMAL HIGH (ref 65–99)

## 2016-06-21 LAB — CBG MONITORING, ED: GLUCOSE-CAPILLARY: 114 mg/dL — AB (ref 65–99)

## 2016-06-21 LAB — LACTIC ACID, PLASMA
LACTIC ACID, VENOUS: 3.6 mmol/L — AB (ref 0.5–1.9)
Lactic Acid, Venous: 3.1 mmol/L (ref 0.5–1.9)

## 2016-06-21 LAB — DIGOXIN LEVEL: DIGOXIN LVL: 0.3 ng/mL — AB (ref 0.8–2.0)

## 2016-06-21 MED ORDER — DIGOXIN 125 MCG PO TABS
0.1250 mg | ORAL_TABLET | Freq: Every day | ORAL | Status: DC
Start: 2016-06-21 — End: 2016-06-25
  Administered 2016-06-21 – 2016-06-25 (×5): 0.125 mg via ORAL
  Filled 2016-06-21 (×5): qty 1

## 2016-06-21 MED ORDER — DEXTROSE 5 % IV SOLN
INTRAVENOUS | Status: AC
  Filled 2016-06-21: qty 2

## 2016-06-21 MED ORDER — SODIUM CHLORIDE 0.9 % IV SOLN
INTRAVENOUS | Status: DC
  Administered 2016-06-21: 03:00:00 via INTRAVENOUS

## 2016-06-21 MED ORDER — WARFARIN - PHARMACIST DOSING INPATIENT
Status: DC
  Administered 2016-06-22 – 2016-06-25 (×4)

## 2016-06-21 MED ORDER — WARFARIN - PHYSICIAN DOSING INPATIENT: Freq: Every day | Status: DC

## 2016-06-21 MED ORDER — WARFARIN SODIUM 2.5 MG PO TABS
2.5000 mg | ORAL_TABLET | Freq: Once | ORAL | Status: AC
  Administered 2016-06-21: 2.5 mg via ORAL
  Filled 2016-06-21: qty 1

## 2016-06-21 MED ORDER — ACETAMINOPHEN 325 MG PO TABS
650.0000 mg | ORAL_TABLET | Freq: Four times a day (QID) | ORAL | Status: DC | PRN
  Administered 2016-06-22: 650 mg via ORAL
  Filled 2016-06-21: qty 2

## 2016-06-21 MED ORDER — ACETAMINOPHEN 650 MG RE SUPP: 650.0000 mg | Freq: Four times a day (QID) | RECTAL | Status: DC | PRN

## 2016-06-21 MED ORDER — WARFARIN SODIUM 5 MG PO TABS: 5.0000 mg | ORAL_TABLET | Freq: Every evening | ORAL | Status: DC

## 2016-06-21 MED ORDER — WARFARIN - PHARMACIST DOSING INPATIENT: Freq: Every day | Status: DC

## 2016-06-21 MED ORDER — INSULIN ASPART 100 UNIT/ML ~~LOC~~ SOLN
0.0000 [IU] | Freq: Three times a day (TID) | SUBCUTANEOUS | Status: DC
  Administered 2016-06-21: 3 [IU] via SUBCUTANEOUS
  Administered 2016-06-21 – 2016-06-22 (×2): 5 [IU] via SUBCUTANEOUS
  Administered 2016-06-22: 2 [IU] via SUBCUTANEOUS
  Administered 2016-06-22: 7 [IU] via SUBCUTANEOUS
  Administered 2016-06-23: 2 [IU] via SUBCUTANEOUS
  Administered 2016-06-23: 7 [IU] via SUBCUTANEOUS
  Administered 2016-06-23: 3 [IU] via SUBCUTANEOUS
  Administered 2016-06-24: 7 [IU] via SUBCUTANEOUS
  Administered 2016-06-24: 3 [IU] via SUBCUTANEOUS
  Administered 2016-06-24: 2 [IU] via SUBCUTANEOUS
  Administered 2016-06-25: 3 [IU] via SUBCUTANEOUS
  Administered 2016-06-25: 5 [IU] via SUBCUTANEOUS
  Administered 2016-06-25: 7 [IU] via SUBCUTANEOUS

## 2016-06-21 MED ORDER — DEXTROSE 5 % IV SOLN
2.0000 g | Freq: Three times a day (TID) | INTRAVENOUS | Status: DC
  Administered 2016-06-21 – 2016-06-22 (×4): 2 g via INTRAVENOUS
  Filled 2016-06-21 (×7): qty 2

## 2016-06-21 MED ORDER — POTASSIUM CHLORIDE IN NACL 20-0.9 MEQ/L-% IV SOLN
INTRAVENOUS | Status: AC
Start: 2016-06-21 — End: 2016-06-22
  Administered 2016-06-21 – 2016-06-22 (×3): via INTRAVENOUS

## 2016-06-21 MED ORDER — LEVOFLOXACIN IN D5W 750 MG/150ML IV SOLN
750.0000 mg | INTRAVENOUS | Status: DC
  Administered 2016-06-21 – 2016-06-22 (×2): 750 mg via INTRAVENOUS
  Filled 2016-06-21 (×2): qty 150

## 2016-06-21 MED ORDER — VANCOMYCIN HCL 10 G IV SOLR
1500.0000 mg | INTRAVENOUS | Status: DC
  Administered 2016-06-21: 1500 mg via INTRAVENOUS
  Filled 2016-06-21 (×2): qty 1500

## 2016-06-21 NOTE — Progress Notes (Signed)
06/20/2016  9:58 PM  06/21/2016 1:27 PM  [name] was seen and examined.  The H&P by the admitting provider , orders, imaging was reviewed.  Please see orders.  Will continue to follow.   Murvin Natal, MD [facility]

## 2016-06-21 NOTE — Progress Notes (Signed)
CRITICAL VALUE ALERT  Critical value received:  Lactic acid 3.66  Date of notification:  06/21/16  Time of notification:  0210  Critical value read back:Yes.    Nurse who received alert:  Elmyra Ricks, RN  MD notified (1st page):  Dr. Shanon Brow  Time of first page:  Notified in person at bedside  MD notified (2nd page):  Time of second page:  Responding MD:   Time MD responded:

## 2016-06-21 NOTE — Progress Notes (Signed)
ANTICOAGULATION CONSULT NOTE - Initial Consult  Pharmacy Consult for Coumadin Indication: atrial fibrillation  Allergies  Allergen Reactions  . Penicillins Hives and Other (See Comments)    Has patient had a PCN reaction causing immediate rash, facial/tongue/throat swelling, SOB or lightheadedness with hypotension: No Has patient had a PCN reaction causing severe rash involving mucus membranes or skin necrosis: No Has patient had a PCN reaction that required hospitalization No Has patient had a PCN reaction occurring within the last 10 years: No If all of the above answers are "NO", then may proceed with Cephalosporin use.     Patient Measurements: Height: 5\' 11"  (180.3 cm) Weight: 288 lb (130.6 kg) IBW/kg (Calculated) : 75.3 Heparin Dosing Weight:   Vital Signs: Temp: 97.9 F (36.6 C) (03/18 0200) Temp Source: Oral (03/18 0200) BP: 115/57 (03/18 0200) Pulse Rate: 88 (03/18 0200)  Labs:  Recent Labs  06/20/16 2219 06/21/16 0425 06/21/16 1029  HGB 14.9 13.1  --   HCT 43.8 39.3  --   PLT 141* 122*  --   LABPROT 27.9*  --  32.1*  INR 2.55  --  3.04  CREATININE 1.09 1.15  --     Estimated Creatinine Clearance: 71.8 mL/min (by C-G formula based on SCr of 1.15 mg/dL).   Medical History: Past Medical History:  Diagnosis Date  . Atrial fibrillation ([place])   . Chronic lymphocytic leukemia ([place])   . Coronary atherosclerosis of native coronary artery    Nonobstructive  . Essential hypertension, benign   . Hyperlipidemia   . IgA nephropathy    Dr. Lowanda Foster  . Type 2 diabetes mellitus ([place])   . UTI (urinary tract infection) july  10 th   taking  med     Medications:  Prescriptions Prior to Admission  Medication Sig Dispense Refill Last Dose  . acetaminophen (TYLENOL) 650 MG CR tablet Take 650 mg by mouth every 8 (eight) hours as needed for pain.   Past Week at Unknown time  . amLODipine (NORVASC) 5 MG tablet Take 5 mg by mouth daily.    06/20/2016 at Unknown time   . Ascorbic Acid (VITAMIN C) 1000 MG tablet Take 1,000 mg by mouth daily.    06/20/2016 at Unknown time  . atorvastatin (LIPITOR) 10 MG tablet Take 10 mg by mouth every evening.   06/20/2016 at Unknown time  . Calcium Carbonate-Vitamin D (CALCIUM 600+D) 600-400 MG-UNIT tablet Take 1 tablet by mouth daily.   06/20/2016 at Unknown time  . cholecalciferol (VITAMIN D) 1000 UNITS tablet Take 1,000 Units by mouth daily.    06/20/2016 at Unknown time  . ciclopirox (LOPROX) 0.77 % cream Apply 1 application topically daily as needed (for rash on foot).   Past Week at Unknown time  . Coenzyme Q10 (CO Q-10) 100 MG CAPS Take 200 mg by mouth daily.    06/20/2016 at Unknown time  . digoxin (LANOXIN) 0.125 MG tablet Take 0.125 mg by mouth daily.   06/20/2016 at unsure  . glimepiride (AMARYL) 4 MG tablet Take 4 mg by mouth 2 (two) times daily.   06/20/2016 at Unknown time  . hydrochlorothiazide 25 MG tablet Take 25 mg by mouth daily.    06/20/2016 at Unknown time  . insulin aspart (NOVOLOG FLEXPEN) 100 UNIT/ML FlexPen Inject 4-18 Units into the skin 2 (two) times daily before a meal. Pt uses per sliding scale before lunch and dinner.   06/20/2016 at Unknown time  . insulin detemir (LEVEMIR FLEXPEN) 100 UNIT/ML injection Inject  50 Units into the skin daily.    06/20/2016 at Unknown time  . Liraglutide (VICTOZA) 18 MG/3ML SOLN Inject 1.8 mg into the skin daily.    06/20/2016 at Unknown time  . lisinopril (PRINIVIL,ZESTRIL) 40 MG tablet Take 40 mg by mouth every evening.    06/20/2016 at Unknown time  . LORazepam (ATIVAN) 1 MG tablet Take 2 mg by mouth at bedtime.    06/19/2016 at Unknown time  . Multiple Vitamin (MULITIVITAMIN WITH MINERALS) TABS Take 1 tablet by mouth daily.    06/20/2016 at Unknown time  . Omega-3 Fatty Acids (FISH OIL) 1200 MG CAPS Take 1,200 mg by mouth 2 (two) times daily.    06/20/2016 at Unknown time  . pioglitazone (ACTOS) 30 MG tablet Take 30 mg by mouth every evening.    06/19/2016 at Unknown time  .  vitamin E 400 UNIT capsule Take 400 Units by mouth daily.    06/20/2016 at Unknown time  . warfarin (COUMADIN) 5 MG tablet Take 5 mg by mouth every evening.   06/20/2016 at 1800    Assessment: Continuation of coumadin PTA for AFIB. INR slightly elevated this AM, 3.04 Coumadin taken last evening before admission per PTA list. Anticoagulation Dose Instructions as of 06/09/2016    Total Sun Mon Tue Wed Thu Fri Sat  New Dose 35 mg 5 mg 5 mg 5 mg 5 mg 5 mg 5 mg 5 mg    (5 mg x 1) (5 mg x 1) (5 mg x 1) (5 mg x 1) (5 mg x 1) (5 mg x 1) (5 mg x 1)    Goal of Therapy:  INR 2-3 Monitor platelets by anticoagulation protocol: Yes   Plan:  Coumadin 2.5 mg po x 1 dose today, lower dose than normal dose due to slightly elevated INR. INR/PT daily  Abner Greenspan, Kjell Brannen Wendell 06/21/2016,11:33 AM

## 2016-06-21 NOTE — H&P (Signed)
History and Physical    [name] [name] DPO:[id number] DOB: 1934/10/27 DOA: 06/20/2016  PCP: [name], MD  Patient coming from: home  Chief Complaint:  Weakness, blood in urine  HPI: [name] [name] is a 82 y.o. male with medical history significant of afib, DM, CAD, CLL lives at home with wife comes in tonight after not being able to get up and stand up.  He reports he went to pee and it was bloody.  Family noted he was very confused also.  In the ED on arrival his temp was over 101.  He denies any cough or respiratory symptoms.  Denies any sob, denies any dysuria or change in urinary stream.  No rashes.  He did vomit earlier once, no diarrhea.  No abdominal pain or chest pain.  Pt sugar was also in the 60s.  He was given some ivf, glucose in the ED and his confusion has resolved.  Pt referred for admission for likely sepsis.  Review of Systems: As per HPI otherwise 10 point review of systems negative.   Past Medical History:  Diagnosis Date  . Atrial fibrillation ([place])   . Chronic lymphocytic leukemia ([place])   . Coronary atherosclerosis of native coronary artery    Nonobstructive  . Essential hypertension, benign   . Hyperlipidemia   . IgA nephropathy    Dr. Lowanda Foster  . Type 2 diabetes mellitus ([place])   . UTI (urinary tract infection) july  10 th   taking  med     Past Surgical History:  Procedure Laterality Date  . AMPUTATION Left 09/15/2012   Procedure: PARTIAL AMPUTATION 3rd TOE LEFT FOOT;  Surgeon: Marcheta Grammes, DPM;  Location: [facility];  Service: Orthopedics;  Laterality: Left;  . AMPUTATION Left 04/20/2013   Procedure: PARTIAL AMPUTATION 2ND TOE LEFT FOOT;  Surgeon: Marcheta Grammes, DPM;  Location: [facility];  Service: Orthopedics;  Laterality: Left;  . ANAL FISSURE REPAIR    . CATARACT EXTRACTION W/PHACO Right 09/30/2015   Procedure: CATARACT EXTRACTION PHACO AND INTRAOCULAR LENS PLACEMENT (IOC);  Surgeon: Tonny Branch, MD;  Location: [facility];  Service: Ophthalmology;   Laterality: Right;  CDE: 11.78  . CATARACT EXTRACTION W/PHACO Left 10/31/2015   Procedure: CATARACT EXTRACTION PHACO AND INTRAOCULAR LENS PLACEMENT LEFT EYE; CDE:  9.10;  Surgeon: Tonny Branch, MD;  Location: [facility];  Service: Ophthalmology;  Laterality: Left;  . COLONOSCOPY  08/19/2011   Procedure: COLONOSCOPY;  Surgeon: Rogene Houston, MD;  Location: [facility];  Service: Endoscopy;  Laterality: N/A;  930  . COLONOSCOPY N/A 09/19/2014   Procedure: COLONOSCOPY;  Surgeon: Rogene Houston, MD;  Location: [facility];  Service: Endoscopy;  Laterality: N/A;  1055  . FEMORAL HERNIA REPAIR    . Open repair left quadriceps tendon.  08/22/07   Dr. Aline Brochure  . Right total knee replacement    . TRANSURETHRAL RESECTION OF PROSTATE       reports that he quit smoking about 53 years ago. His smoking use included Cigarettes. He quit after 10.00 years of use. He has never used smokeless tobacco. He reports that he does not drink alcohol or use drugs.  Allergies  Allergen Reactions  . Penicillins Hives and Other (See Comments)    Has patient had a PCN reaction causing immediate rash, facial/tongue/throat swelling, SOB or lightheadedness with hypotension: No Has patient had a PCN reaction causing severe rash involving mucus membranes or skin necrosis: No Has patient had a PCN reaction that required hospitalization  No Has patient had a PCN reaction occurring within the last 10 years: No If all of the above answers are "NO", then may proceed with Cephalosporin use.     Family History  Problem Relation Age of Onset  . Atrial fibrillation Mother     Prior to Admission medications   Medication Sig Start Date End Date Taking? Authorizing Provider  acetaminophen (TYLENOL) 650 MG CR tablet Take 650 mg by mouth every 8 (eight) hours as needed for pain.   Yes Historical Provider, MD  amLODipine (NORVASC) 5 MG tablet Take 5 mg by mouth daily.    Yes Historical Provider, MD  Ascorbic Acid (VITAMIN C) 1000 MG  tablet Take 1,000 mg by mouth daily.    Yes Historical Provider, MD  atorvastatin (LIPITOR) 10 MG tablet Take 10 mg by mouth every evening.   Yes Historical Provider, MD  Calcium Carbonate-Vitamin D (CALCIUM 600+D) 600-400 MG-UNIT tablet Take 1 tablet by mouth daily.   Yes Historical Provider, MD  cholecalciferol (VITAMIN D) 1000 UNITS tablet Take 1,000 Units by mouth daily.    Yes Historical Provider, MD  ciclopirox (LOPROX) 0.77 % cream Apply 1 application topically daily as needed (for rash on foot).   Yes Historical Provider, MD  Coenzyme Q10 (CO Q-10) 100 MG CAPS Take 200 mg by mouth daily.    Yes Historical Provider, MD  digoxin (LANOXIN) 0.125 MG tablet Take 0.125 mg by mouth daily.   Yes Historical Provider, MD  glimepiride (AMARYL) 4 MG tablet Take 4 mg by mouth 2 (two) times daily.   Yes Historical Provider, MD  hydrochlorothiazide 25 MG tablet Take 25 mg by mouth daily.    Yes Historical Provider, MD  insulin aspart (NOVOLOG FLEXPEN) 100 UNIT/ML FlexPen Inject 4-18 Units into the skin 2 (two) times daily before a meal. Pt uses per sliding scale before lunch and dinner.   Yes Historical Provider, MD  insulin detemir (LEVEMIR FLEXPEN) 100 UNIT/ML injection Inject 50 Units into the skin daily.    Yes Historical Provider, MD  Liraglutide (VICTOZA) 18 MG/3ML SOLN Inject 1.8 mg into the skin daily.    Yes Historical Provider, MD  lisinopril (PRINIVIL,ZESTRIL) 40 MG tablet Take 40 mg by mouth every evening.    Yes Historical Provider, MD  LORazepam (ATIVAN) 1 MG tablet Take 2 mg by mouth at bedtime.    Yes Historical Provider, MD  Multiple Vitamin (MULITIVITAMIN WITH MINERALS) TABS Take 1 tablet by mouth daily.    Yes Historical Provider, MD  Omega-3 Fatty Acids (FISH OIL) 1200 MG CAPS Take 1,200 mg by mouth 2 (two) times daily.    Yes Historical Provider, MD  pioglitazone (ACTOS) 30 MG tablet Take 30 mg by mouth every evening.    Yes Historical Provider, MD  vitamin E 400 UNIT capsule Take 400  Units by mouth daily.    Yes Historical Provider, MD  warfarin (COUMADIN) 5 MG tablet Take 5 mg by mouth every evening.   Yes Historical Provider, MD    Physical Exam: Vitals:   06/21/16 0030 06/21/16 0045 06/21/16 0100 06/21/16 0115  BP: 105/61 118/62 125/60 135/66  Pulse: 94 93 93 90  Resp: (!) 26 (!) 28 (!) 28 (!) 23  Temp:      TempSrc:      SpO2: 98% 98% 99% 98%  Weight:      Height:         Constitutional: NAD, calm, comfortable Vitals:   06/21/16 0030 06/21/16 0045 06/21/16 0100 06/21/16  0115  BP: 105/61 118/62 125/60 135/66  Pulse: 94 93 93 90  Resp: (!) 26 (!) 28 (!) 28 (!) 23  Temp:      TempSrc:      SpO2: 98% 98% 99% 98%  Weight:      Height:       Eyes: PERRL, lids and conjunctivae normal ENMT: Mucous membranes are moist. Posterior pharynx clear of any exudate or lesions.Normal dentition.  Neck: normal, supple, no masses, no thyromegaly Respiratory: clear to auscultation bilaterally, no wheezing, no crackles. Normal respiratory effort. No accessory muscle use.  Cardiovascular: Regular rate and rhythm, no murmurs / rubs / gallops. No extremity edema. 2+ pedal pulses. No carotid bruits.  Abdomen: no tenderness, no masses palpated. No hepatosplenomegaly. Bowel sounds positive.  Musculoskeletal: no clubbing / cyanosis. No joint deformity upper and lower extremities. Good ROM, no contractures. Normal muscle tone.  Skin: no rashes, lesions, ulcers. No induration Neurologic: CN 2-12 grossly intact. Sensation intact, DTR normal. Strength 5/5 in all 4.  Psychiatric: Normal judgment and insight. Alert and oriented x 3. Normal mood.    Labs on Admission: I have personally reviewed following labs and imaging studies  CBC:  Recent Labs Lab 06/16/16 1436 06/20/16 2219  WBC 15.9* 12.2*  NEUTROABS  --  8.3*  HGB 14.3 14.9  HCT 44.0 43.8  MCV 90.7 88.8  PLT 199 009*   Basic Metabolic Panel:  Recent Labs Lab 06/20/16 2219  NA 137  K 3.3*  CL 100*  CO2 27    GLUCOSE 62*  BUN 26*  CREATININE 1.09  CALCIUM 10.0   GFR: Estimated Creatinine Clearance: 76.7 mL/min (by C-G formula based on SCr of 1.09 mg/dL). Liver Function Tests:  Recent Labs Lab 06/20/16 2219  AST 41  ALT 21  ALKPHOS 122  BILITOT 2.3*  PROT 6.4*  ALBUMIN 3.7   Coagulation Profile:  Recent Labs Lab 06/20/16 2219  INR 2.55   CBG:  Recent Labs Lab 06/20/16 2228 06/21/16 0110  GLUCAP 70 114*   Urine analysis:    Component Value Date/Time   COLORURINE AMBER (A) 06/20/2016 2354   APPEARANCEUR HAZY (A) 06/20/2016 2354   LABSPEC 1.010 06/20/2016 2354   PHURINE 5.0 06/20/2016 2354   GLUCOSEU NEGATIVE 06/20/2016 2354   HGBUR SMALL (A) 06/20/2016 2354   BILIRUBINUR NEGATIVE 06/20/2016 2354   [place] 06/20/2016 2354   PROTEINUR NEGATIVE 06/20/2016 2354   NITRITE NEGATIVE 06/20/2016 2354   LEUKOCYTESUR NEGATIVE 06/20/2016 2354    Recent Results (from the past 240 hour(s))  Blood Culture (routine x 2)     Status: None (Preliminary result)   Collection Time: 06/20/16 10:19 PM  Result Value Ref Range Status   Specimen Description RIGHT ANTECUBITAL  Final   Special Requests BOTTLES DRAWN AEROBIC AND ANAEROBIC [facility] EACH  Final   Culture PENDING  Incomplete   Report Status PENDING  Incomplete  Blood Culture (routine x 2)     Status: None (Preliminary result)   Collection Time: 06/20/16 10:33 PM  Result Value Ref Range Status   Specimen Description BLOOD RIGHT ARM  Final   Special Requests BOTTLES DRAWN AEROBIC AND ANAEROBIC [facility] EACH  Final   Culture PENDING  Incomplete   Report Status PENDING  Incomplete     Radiological Exams on Admission: Dg Chest Port 1 View  Result Date: 06/20/2016 CLINICAL DATA:  82 year old male with history of fever. Bleeding from the penis. EXAM: PORTABLE CHEST 1 VIEW COMPARISON:  Chest x-ray 08/23/2006. FINDINGS:  There is cephalization of the pulmonary vasculature and slight indistinctness of the interstitial markings  suggestive of mild pulmonary edema. A a small left pleural effusion. Mild cardiomegaly. Upper mediastinal contours are within normal limits. Aortic atherosclerosis. IMPRESSION: 1. The appearance the chest is most suggestive of mild congestive heart failure. 2. Aortic atherosclerosis. Electronically Signed   By: Vinnie Langton M.D.   On: 06/20/2016 23:24   Old chart reviewed Case discussed with EDP cxr reviewed mild edema   Assessment/Plan 82 yo male with sepsis from unclear source  Principal Problem:   Sepsis ([place])- PCN allergic, given vanc/azactam/levo until culture data available.  Blood and urine cx obtained.  cxr no infiltrate.  Pt given only 2 liters ivf in ED due to evidence of possible edema on cxr.  Will continue ivf.  Lactate is not much improved but was repeat only after about a liter of ivf.  Serial q 3 hours.  o2 sats are normal and lung exam is clear.    Active Problems:   Hypoglycemia- hold insulin products.  Check glucose q 2hours   Acute metabolic encephalopathy- due to infection plus hypoglycemia, resolved   CORONARY ATHEROSCLEROSIS NATIVE CORONARY ARTERY- noted   Atrial fibrillation ([place])- rate controlled at this time   Long term current use of anticoagulant therapy- coumadin per pharm   Type 2 diabetes mellitus ([place])-  Holding insulin products due to gluc of 62   Chronic lymphocytic leukemia ([place])- noted, and stable   DVT prophylaxis:  On coumadin Code Status:  full Family Communication:  Son at bedside  Disposition Plan:  Per day team Consults called:  none Admission status:  admission   DAVID,RACHAL A MD [facility]  If 7PM-7AM, please contact night-coverage [email or web address] Password [email or web address]  06/21/2016, 1:44 AM

## 2016-06-21 NOTE — Progress Notes (Signed)
Pharmacy Antibiotic Note  [name] [name] is a 82 y.o. male admitted on 06/20/2016 with sepsis.  Pharmacy has been consulted for Azactam, Vancomycin and Levaquin dosing. Loading doses given last evening. Calculated normalized CrCl 53 ml/min, obese patient  Plan: Azactam 2 GM IV every 8 hours Levaquin 750 mg IV every 24 hours Vancomycin 1500 mg IV every 24 hours Labs per protocol   Height: 5\' 11"  (180.3 cm) Weight: 288 lb (130.6 kg) IBW/kg (Calculated) : 75.3  Temp (24hrs), Avg:99.5 F (37.5 C), Min:97.9 F (36.6 C), Max:101.1 F (38.4 C)   Recent Labs Lab 06/16/16 1436 06/20/16 2219 06/20/16 2252 06/21/16 0109 06/21/16 0112 06/21/16 0425  WBC 15.9* 12.2*  --   --   --  21.8*  CREATININE  --  1.09  --   --   --  1.15  LATICACIDVEN  --   --  3.67* 3.1* 3.66* 3.6*    Estimated Creatinine Clearance: 71.8 mL/min (by C-G formula based on SCr of 1.15 mg/dL).    Allergies  Allergen Reactions  . Penicillins Hives and Other (See Comments)    Has patient had a PCN reaction causing immediate rash, facial/tongue/throat swelling, SOB or lightheadedness with hypotension: No Has patient had a PCN reaction causing severe rash involving mucus membranes or skin necrosis: No Has patient had a PCN reaction that required hospitalization No Has patient had a PCN reaction occurring within the last 10 years: No If all of the above answers are "NO", then may proceed with Cephalosporin use.     Antimicrobials this admission: Azactam 3/18 >>  Vancomycin 3/17 >>  Levaquin 3/17 >>    Thank you for allowing pharmacy to be a part of this patient's care.  Chriss Czar 06/21/2016 8:33 AM

## 2016-06-21 NOTE — Progress Notes (Signed)
ANTIBIOTIC CONSULT NOTE-Preliminary  Pharmacy Consult for aztreonam, vancomycin Indication: sepsis  Allergies  Allergen Reactions  . Penicillins Hives and Other (See Comments)    Has patient had a PCN reaction causing immediate rash, facial/tongue/throat swelling, SOB or lightheadedness with hypotension: No Has patient had a PCN reaction causing severe rash involving mucus membranes or skin necrosis: No Has patient had a PCN reaction that required hospitalization No Has patient had a PCN reaction occurring within the last 10 years: No If all of the above answers are "NO", then may proceed with Cephalosporin use.     Patient Measurements: Height: 5\' 11"  (180.3 cm) Weight: 295 lb (133.8 kg) IBW/kg (Calculated) : 75.3 Adjusted Body Weight:   Vital Signs: Temp: 101.1 F (38.4 C) (03/17 2213) Temp Source: Oral (03/17 2213) BP: 135/66 (03/18 0115) Pulse Rate: 90 (03/18 0115)  Labs:  Recent Labs  06/20/16 2219  WBC 12.2*  HGB 14.9  PLT 141*  CREATININE 1.09    Estimated Creatinine Clearance: 76.7 mL/min (by C-G formula based on SCr of 1.09 mg/dL).  No results for input(s): VANCOTROUGH, VANCOPEAK, VANCORANDOM, GENTTROUGH, GENTPEAK, GENTRANDOM, TOBRATROUGH, TOBRAPEAK, TOBRARND, AMIKACINPEAK, AMIKACINTROU, AMIKACIN in the last 72 hours.   Microbiology: Recent Results (from the past 720 hour(s))  Blood Culture (routine x 2)     Status: None (Preliminary result)   Collection Time: 06/20/16 10:19 PM  Result Value Ref Range Status   Specimen Description RIGHT ANTECUBITAL  Final   Special Requests BOTTLES DRAWN AEROBIC AND ANAEROBIC [facility] EACH  Final   Culture PENDING  Incomplete   Report Status PENDING  Incomplete  Blood Culture (routine x 2)     Status: None (Preliminary result)   Collection Time: 06/20/16 10:33 PM  Result Value Ref Range Status   Specimen Description BLOOD RIGHT ARM  Final   Special Requests BOTTLES DRAWN AEROBIC AND ANAEROBIC [facility] EACH  Final   Culture  PENDING  Incomplete   Report Status PENDING  Incomplete    Medical History: Past Medical History:  Diagnosis Date  . Atrial fibrillation ([place])   . Chronic lymphocytic leukemia ([place])   . Coronary atherosclerosis of native coronary artery    Nonobstructive  . Essential hypertension, benign   . Hyperlipidemia   . IgA nephropathy    Dr. Lowanda Foster  . Type 2 diabetes mellitus ([place])   . UTI (urinary tract infection) july  10 th   taking  med     Medications:   (Not in a hospital admission) Scheduled:   Infusions:  . aztreonam     PRN:  Anti-infectives    Start     Dose/Rate Route Frequency Ordered Stop   06/21/16 0700  aztreonam (AZACTAM) 2 g in dextrose 5 % 50 mL IVPB     2 g 100 mL/hr over 30 Minutes Intravenous  Once 06/20/16 2331     06/20/16 2315  vancomycin (VANCOCIN) IVPB 1000 mg/200 mL premix     1,000 mg 200 mL/hr over 60 Minutes Intravenous  Once 06/20/16 2302 06/21/16 0121   06/20/16 2230  levofloxacin (LEVAQUIN) IVPB 750 mg     750 mg 100 mL/hr over 90 Minutes Intravenous  Once 06/20/16 2219 06/21/16 0031   06/20/16 2230  aztreonam (AZACTAM) 2 g in dextrose 5 % 50 mL IVPB     2 g 100 mL/hr over 30 Minutes Intravenous  Once 06/20/16 2219 06/20/16 2334   06/20/16 2230  vancomycin (VANCOCIN) IVPB 1000 mg/200 mL premix     1,000 mg 200  mL/hr over 60 Minutes Intravenous  Once 06/20/16 2219 06/21/16 0016      Assessment: 82 yo male in ED for bloody penile discharge. Starting vancomycin, Levaquin, and aztreonam for sepsis presumably from urinary source  Goal of Therapy:  Vancomycin trough level 15-20 mcg/ml  Plan:  Preliminary review of pertinent patient information completed.  Protocol will be initiated with dose(s) of aztreonam 2 grams 8 hours after ED dose, and an additional 1 gram of vancomycin for 2 gram loading dose.  Forestine Na clinical pharmacist will complete review during morning rounds to assess patient and finalize treatment regimen if  needed.  Kristoff Coonradt, [place], [place] 06/21/2016,1:27 AM

## 2016-06-22 DIAGNOSIS — I2510 Atherosclerotic heart disease of native coronary artery without angina pectoris: Secondary | ICD-10-CM

## 2016-06-22 DIAGNOSIS — Z7901 Long term (current) use of anticoagulants: Secondary | ICD-10-CM

## 2016-06-22 DIAGNOSIS — E118 Type 2 diabetes mellitus with unspecified complications: Secondary | ICD-10-CM

## 2016-06-22 DIAGNOSIS — G9341 Metabolic encephalopathy: Secondary | ICD-10-CM

## 2016-06-22 DIAGNOSIS — E162 Hypoglycemia, unspecified: Secondary | ICD-10-CM

## 2016-06-22 DIAGNOSIS — C9110 Chronic lymphocytic leukemia of B-cell type not having achieved remission: Secondary | ICD-10-CM

## 2016-06-22 DIAGNOSIS — I4891 Unspecified atrial fibrillation: Secondary | ICD-10-CM

## 2016-06-22 LAB — BASIC METABOLIC PANEL
ANION GAP: 7 (ref 5–15)
BUN: 38 mg/dL — ABNORMAL HIGH (ref 6–20)
CO2: 22 mmol/L (ref 22–32)
Calcium: 8.6 mg/dL — ABNORMAL LOW (ref 8.9–10.3)
Chloride: 107 mmol/L (ref 101–111)
Creatinine, Ser: 1.03 mg/dL (ref 0.61–1.24)
GFR calc Af Amer: >60 mL/min (ref >60)
Glucose, Bld: 172 mg/dL — ABNORMAL HIGH (ref 65–99)
POTASSIUM: 3.1 mmol/L — AB (ref 3.5–5.1)
SODIUM: 136 mmol/L (ref 135–145)

## 2016-06-22 LAB — CBC
HEMATOCRIT: 35.9 % — AB (ref 39.0–52.0)
HEMOGLOBIN: 12.4 g/dL — AB (ref 13.0–17.0)
MCH: 30.3 pg (ref 26.0–34.0)
MCHC: 34.5 g/dL (ref 30.0–36.0)
MCV: 87.8 fL (ref 78.0–100.0)
Platelets: 110 10*3/uL — ABNORMAL LOW (ref 150–400)
RBC: 4.09 MIL/uL — AB (ref 4.22–5.81)
RDW: 15.3 % (ref 11.5–15.5)
WBC: 22.7 10*3/uL — AB (ref 4.0–10.5)

## 2016-06-22 LAB — BLOOD CULTURE ID PANEL (REFLEXED)
ACINETOBACTER BAUMANNII: NOT DETECTED
CANDIDA ALBICANS: NOT DETECTED
CANDIDA GLABRATA: NOT DETECTED
CANDIDA KRUSEI: NOT DETECTED
CANDIDA PARAPSILOSIS: NOT DETECTED
CARBAPENEM RESISTANCE: NOT DETECTED
Candida tropicalis: NOT DETECTED
ESCHERICHIA COLI: DETECTED — AB
Enterobacter cloacae complex: NOT DETECTED
Enterobacteriaceae species: DETECTED — AB
Enterococcus species: NOT DETECTED
Haemophilus influenzae: NOT DETECTED
KLEBSIELLA OXYTOCA: NOT DETECTED
KLEBSIELLA PNEUMONIAE: NOT DETECTED
Listeria monocytogenes: NOT DETECTED
Neisseria meningitidis: NOT DETECTED
PSEUDOMONAS AERUGINOSA: NOT DETECTED
Proteus species: NOT DETECTED
STAPHYLOCOCCUS AUREUS BCID: NOT DETECTED
STREPTOCOCCUS PYOGENES: NOT DETECTED
Serratia marcescens: NOT DETECTED
Staphylococcus species: NOT DETECTED
Streptococcus agalactiae: NOT DETECTED
Streptococcus pneumoniae: NOT DETECTED
Streptococcus species: NOT DETECTED

## 2016-06-22 LAB — MAGNESIUM: MAGNESIUM: 1.4 mg/dL — AB (ref 1.7–2.4)

## 2016-06-22 LAB — PROTIME-INR
INR: 2.12
Prothrombin Time: 24.1 seconds — ABNORMAL HIGH (ref 11.4–15.2)

## 2016-06-22 LAB — GLUCOSE, CAPILLARY
GLUCOSE-CAPILLARY: 172 mg/dL — AB (ref 65–99)
GLUCOSE-CAPILLARY: 286 mg/dL — AB (ref 65–99)
Glucose-Capillary: 157 mg/dL — ABNORMAL HIGH (ref 65–99)
Glucose-Capillary: 302 mg/dL — ABNORMAL HIGH (ref 65–99)
Glucose-Capillary: 220 mg/dL — ABNORMAL HIGH (ref 65–99)

## 2016-06-22 LAB — LACTIC ACID, PLASMA: Lactic Acid, Venous: 1.3 mmol/L (ref 0.5–1.9)

## 2016-06-22 MED ORDER — POTASSIUM CHLORIDE CRYS ER 20 MEQ PO TBCR
60.0000 meq | EXTENDED_RELEASE_TABLET | Freq: Once | ORAL | Status: AC
  Administered 2016-06-22: 60 meq via ORAL
  Filled 2016-06-22: qty 3

## 2016-06-22 MED ORDER — INSULIN ASPART 100 UNIT/ML ~~LOC~~ SOLN
4.0000 [IU] | Freq: Three times a day (TID) | SUBCUTANEOUS | Status: DC
  Administered 2016-06-22 (×2): 4 [IU] via SUBCUTANEOUS

## 2016-06-22 MED ORDER — WARFARIN SODIUM 5 MG PO TABS
5.0000 mg | ORAL_TABLET | Freq: Once | ORAL | Status: AC
  Administered 2016-06-22: 5 mg via ORAL
  Filled 2016-06-22: qty 1

## 2016-06-22 MED ORDER — INSULIN ASPART 100 UNIT/ML ~~LOC~~ SOLN
8.0000 [IU] | Freq: Three times a day (TID) | SUBCUTANEOUS | Status: DC
  Administered 2016-06-22 – 2016-06-25 (×10): 8 [IU] via SUBCUTANEOUS

## 2016-06-22 NOTE — Progress Notes (Signed)
Inpatient Diabetes Program Recommendations  AACE/ADA: New Consensus Statement on Inpatient Glycemic Control (2015)  Target Ranges:  Prepandial:   less than 140 mg/dL      Peak postprandial:   less than 180 mg/dL (1-2 hours)      Critically ill patients:  140 - 180 mg/dL  Results for DONG, NIMMONS (MRN [id number]) as of 06/22/2016 07:59  Ref. Range 06/22/2016 05:05 06/22/2016 07:53  Glucose-Capillary Latest Ref Range: 65 - 99 mg/dL 172 (H) 157 (H)   Results for LARRY, ALCOCK (MRN [id number]) as of 06/22/2016 07:59  Ref. Range 06/20/2016 22:28 06/21/2016 01:10 06/21/2016 02:06 06/21/2016 04:12 06/21/2016 05:51 06/21/2016 07:43 06/21/2016 11:20 06/21/2016 16:26 06/21/2016 21:26  Glucose-Capillary Latest Ref Range: 65 - 99 mg/dL 70 114 (H) 105 (H) 122 (H) 128 (H) 138 (H) 229 (H) 283 (H) 229 (H)   Review of Glycemic Control  Diabetes history: DM2 Outpatient Diabetes medications: Amaryl 4 mg BID, Levemir 50 units daily, Novolog 4-18 units BID (lunch and supper), Actos 30 mg QPM, Victoza 1.8 mg daily Current orders for Inpatient glycemic control: Novolog 0-9 units TID with meals  Inpatient Diabetes Program Recommendations: Correction (SSI): Please consider adding Novolog bedtime correction scale. Insulin - Meal Coverage: If patient is eating at least 50% of meals, please consider ordering Novolog 4 units TID with meals for meal coverage. HgbA1C: Please consider adding on an A1C to blood in lab if available (or with next scheduled blood draw) to evaluate glycemic control over the past 2-3 months.  NOTE: In reviewing chart, noted patient presented initially with hypoglycemia. Patient is on multiple DM medications as an outpatient and currently only ordered Novolog correction TID with meals.  Thanks, Barnie Alderman, RN, MSN, CDE Diabetes Coordinator Inpatient Diabetes Program [phone] (Team Pager from 8am to 5pm)

## 2016-06-22 NOTE — Progress Notes (Signed)
PHARMACY - PHYSICIAN COMMUNICATION CRITICAL VALUE ALERT - BLOOD CULTURE IDENTIFICATION (BCID)  1 of 2 cultures reported positive:  Possible contamination  Results for orders placed or performed during the hospital encounter of 06/20/16  Blood Culture ID Panel (Reflexed) (Collected: 06/20/2016 10:19 PM)  Result Value Ref Range   Enterococcus species NOT DETECTED NOT DETECTED   Listeria monocytogenes NOT DETECTED NOT DETECTED   Staphylococcus species NOT DETECTED NOT DETECTED   Staphylococcus aureus NOT DETECTED NOT DETECTED   Streptococcus species NOT DETECTED NOT DETECTED   Streptococcus agalactiae NOT DETECTED NOT DETECTED   Streptococcus pneumoniae NOT DETECTED NOT DETECTED   Streptococcus pyogenes NOT DETECTED NOT DETECTED   Acinetobacter baumannii NOT DETECTED NOT DETECTED   Enterobacteriaceae species DETECTED (A) NOT DETECTED   Enterobacter cloacae complex NOT DETECTED NOT DETECTED   Escherichia coli DETECTED (A) NOT DETECTED   Klebsiella oxytoca NOT DETECTED NOT DETECTED   Klebsiella pneumoniae NOT DETECTED NOT DETECTED   Proteus species NOT DETECTED NOT DETECTED   Serratia marcescens NOT DETECTED NOT DETECTED   Carbapenem resistance NOT DETECTED NOT DETECTED   Haemophilus influenzae NOT DETECTED NOT DETECTED   Neisseria meningitidis NOT DETECTED NOT DETECTED   Pseudomonas aeruginosa NOT DETECTED NOT DETECTED   Candida albicans NOT DETECTED NOT DETECTED   Candida glabrata NOT DETECTED NOT DETECTED   Candida krusei NOT DETECTED NOT DETECTED   Candida parapsilosis NOT DETECTED NOT DETECTED   Candida tropicalis NOT DETECTED NOT DETECTED   Name of physician (or Provider) Contacted: Dr Wynetta Emery  Changes to prescribed antibiotics required: Continue Levaquin, f/u sensitivities, consider switch to Rocephin if worsens.  Hart Robinsons A 06/22/2016  3:30 PM

## 2016-06-22 NOTE — Progress Notes (Signed)
0921CRITICAL VALUE ALERT  Critical value received:  Blood culture gram negative rods  Date of notification:  06/22/2016  Time of notification:  0918  Critical value read back:Yes.    Nurse who received alert:  MBHawkins, RN  MD notified (1st page):  Dr. Wynetta Emery  Time of first page:  0921  MD notified (2nd page):  Time of second page:  Responding MD:    Time MD responded:

## 2016-06-22 NOTE — Progress Notes (Signed)
[place] for Coumadin Indication: atrial fibrillation  Allergies  Allergen Reactions  . Penicillins Hives and Other (See Comments)    Has patient had a PCN reaction causing immediate rash, facial/tongue/throat swelling, SOB or lightheadedness with hypotension: No Has patient had a PCN reaction causing severe rash involving mucus membranes or skin necrosis: No Has patient had a PCN reaction that required hospitalization No Has patient had a PCN reaction occurring within the last 10 years: No If all of the above answers are "NO", then may proceed with Cephalosporin use.    Patient Measurements: Height: 5\' 11"  (180.3 cm) Weight: 290 lb 2 oz (131.6 kg) IBW/kg (Calculated) : 75.3 Heparin Dosing Weight:   Vital Signs: Temp: 97.7 F (36.5 C) (03/19 0543) Temp Source: Oral (03/19 0543) BP: 104/49 (03/19 0543) Pulse Rate: 88 (03/19 0543)  Labs:  Recent Labs  06/20/16 2219 06/21/16 0425 06/21/16 1029 06/22/16 0443  HGB 14.9 13.1  --  12.4*  HCT 43.8 39.3  --  35.9*  PLT 141* 122*  --  110*  LABPROT 27.9*  --  32.1* 24.1*  INR 2.55  --  3.04 2.12  CREATININE 1.09 1.15  --  1.03    Estimated Creatinine Clearance: 80.4 mL/min (by C-G formula based on SCr of 1.03 mg/dL).  Medical History: Past Medical History:  Diagnosis Date  . Atrial fibrillation ([place])   . Chronic lymphocytic leukemia ([place])   . Coronary atherosclerosis of native coronary artery    Nonobstructive  . Essential hypertension, benign   . Hyperlipidemia   . IgA nephropathy    Dr. Lowanda Foster  . Type 2 diabetes mellitus ([place])   . UTI (urinary tract infection) july  10 th   taking  med    Medications:  Prescriptions Prior to Admission  Medication Sig Dispense Refill Last Dose  . acetaminophen (TYLENOL) 650 MG CR tablet Take 650 mg by mouth every 8 (eight) hours as needed for pain.   Past Week at Unknown time  . amLODipine (NORVASC) 5 MG tablet Take 5 mg by mouth daily.     06/20/2016 at Unknown time  . Ascorbic Acid (VITAMIN C) 1000 MG tablet Take 1,000 mg by mouth daily.    06/20/2016 at Unknown time  . atorvastatin (LIPITOR) 10 MG tablet Take 10 mg by mouth every evening.   06/20/2016 at Unknown time  . Calcium Carbonate-Vitamin D (CALCIUM 600+D) 600-400 MG-UNIT tablet Take 1 tablet by mouth daily.   06/20/2016 at Unknown time  . cholecalciferol (VITAMIN D) 1000 UNITS tablet Take 1,000 Units by mouth daily.    06/20/2016 at Unknown time  . ciclopirox (LOPROX) 0.77 % cream Apply 1 application topically daily as needed (for rash on foot).   Past Week at Unknown time  . Coenzyme Q10 (CO Q-10) 100 MG CAPS Take 200 mg by mouth daily.    06/20/2016 at Unknown time  . digoxin (LANOXIN) 0.125 MG tablet Take 0.125 mg by mouth daily.   06/20/2016 at unsure  . glimepiride (AMARYL) 4 MG tablet Take 4 mg by mouth 2 (two) times daily.   06/20/2016 at Unknown time  . hydrochlorothiazide 25 MG tablet Take 25 mg by mouth daily.    06/20/2016 at Unknown time  . insulin aspart (NOVOLOG FLEXPEN) 100 UNIT/ML FlexPen Inject 4-18 Units into the skin 2 (two) times daily before a meal. Pt uses per sliding scale before lunch and dinner.   06/20/2016 at Unknown time  . insulin detemir (LEVEMIR  FLEXPEN) 100 UNIT/ML injection Inject 50 Units into the skin daily.    06/20/2016 at Unknown time  . Liraglutide (VICTOZA) 18 MG/3ML SOLN Inject 1.8 mg into the skin daily.    06/20/2016 at Unknown time  . lisinopril (PRINIVIL,ZESTRIL) 40 MG tablet Take 40 mg by mouth every evening.    06/20/2016 at Unknown time  . LORazepam (ATIVAN) 1 MG tablet Take 2 mg by mouth at bedtime.    06/19/2016 at Unknown time  . Multiple Vitamin (MULITIVITAMIN WITH MINERALS) TABS Take 1 tablet by mouth daily.    06/20/2016 at Unknown time  . Omega-3 Fatty Acids (FISH OIL) 1200 MG CAPS Take 1,200 mg by mouth 2 (two) times daily.    06/20/2016 at Unknown time  . pioglitazone (ACTOS) 30 MG tablet Take 30 mg by mouth every evening.     06/19/2016 at Unknown time  . vitamin E 400 UNIT capsule Take 400 Units by mouth daily.    06/20/2016 at Unknown time  . warfarin (COUMADIN) 5 MG tablet Take 5 mg by mouth every evening.   06/20/2016 at 1800   Assessment: Continuation of coumadin PTA for AFIB. INR therapeutic.   Anticoagulation Dose Instructions as of 06/09/2016    Total Sun Mon Tue Wed Thu Fri Sat  New Dose 35 mg 5 mg 5 mg 5 mg 5 mg 5 mg 5 mg 5 mg    (5 mg x 1) (5 mg x 1) (5 mg x 1) (5 mg x 1) (5 mg x 1) (5 mg x 1) (5 mg x 1)   Goal of Therapy:  INR 2-3 Monitor platelets by anticoagulation protocol: Yes   Plan:  Coumadin 5 mg po x 1 dose today INR/PT daily  [name] [name] A 06/22/2016,8:57 AM

## 2016-06-22 NOTE — Progress Notes (Signed)
Pt O2 saturation 84 on tele monitor. 2L O2 applied and sats now at 98%. Notified RN taking care of this pt.

## 2016-06-22 NOTE — Progress Notes (Signed)
PROGRESS NOTE    [name] [name]  IZT:[id number]  DOB: 06/08/1934  DOA: 06/20/2016 PCP: [name], MD Outpatient Specialists:   Hospital course: [name] [name] is a 82 y.o. male with medical history significant of afib, DM, CAD, CLL lives at home with wife comes in tonight after not being able to get up and stand up.  He reports he went to pee and it was bloody.  Family noted he was very confused also.  In the ED on arrival his temp was over 101.  He denies any cough or respiratory symptoms.  Denies any sob, denies any dysuria or change in urinary stream.  No rashes.  He did vomit earlier once, no diarrhea.  No abdominal pain or chest pain.  Pt sugar was also in the 60s.  He was given some ivf, glucose in the ED and his confusion has resolved.  Pt referred for admission for likely sepsis.  Assessment & Plan:   Gram negative Sepsis - urine culture growing gram negative rods, will de-escalate antibiotic coverage.  Because of urinary source and penile bleeding on presentation, requested a urology consultation, Pt [facility] to [facility]rology as a patient, will be in [facility] 3/20.   Continue IV levaquin.  Follow cultures.  Continue supportive therapy. Clinically patient is feeling better.    Hypoglycemia - likely secondary to sepsis and poor oral intake at admission, follow closely but it appears to be resolved now.   Atrial fibrillation - chronic, rate controlled, following, pharmacy assisting with warfarin management in the hospital.    Type 2 diabetes mellitus, now with hyperglycemia - see orders for adding prandial coverage, continue supplemental coverage.   CLL - stable    DVT prophylaxis: warfarin Code Status: full  Family Communication: wife Disposition Plan: home   Subjective: Pt feeling better today.    Objective: Vitals:   06/21/16 0200 06/21/16 0825 06/21/16 2129 06/22/16 0543  BP: (!) 115/57  (!) 94/38 (!) 104/49  Pulse: 88  79 88  Resp: (!) 32  20 16  Temp: 97.9 F  (36.6 C)  98.5 F (36.9 C) 97.7 F (36.5 C)  TempSrc: Oral  Oral Oral  SpO2: 93% 95% 96% 100%  Weight: 130.6 kg (288 lb)   131.6 kg (290 lb 2 oz)  Height: 5\' 11"  (1.803 m)       Intake/Output Summary (Last 24 hours) at 06/22/16 1102 Last data filed at 06/22/16 0539  Gross per 24 hour  Intake             1341 ml  Output             1780 ml  Net             -439 ml   Filed Weights   06/20/16 2200 06/21/16 0200 06/22/16 0543  Weight: 133.8 kg (295 lb) 130.6 kg (288 lb) 131.6 kg (290 lb 2 oz)    Exam:  General exam: awake, alert, Nad. Cooperative.  Respiratory system: No increased work of breathing. Cardiovascular system: S1 & S2 heard.  Gastrointestinal system: Abdomen is nondistended, soft and nontender. Normal bowel sounds heard. Central nervous system: Alert and oriented. No focal neurological deficits. Extremities: no cyanosis.  Data Reviewed: Basic Metabolic Panel:  Recent Labs Lab 06/20/16 2219 06/21/16 0425 06/22/16 0443  NA 137 134* 136  K 3.3* 3.4* 3.1*  CL 100* 102 107  CO2 27 22 22   GLUCOSE 62* 137* 172*  BUN 26* 26* 38*  CREATININE 1.09 1.15 1.03  CALCIUM 10.0 8.6* 8.6*  MG  --   --  1.4*   Liver Function Tests:  Recent Labs Lab 06/20/16 2219  AST 41  ALT 21  ALKPHOS 122  BILITOT 2.3*  PROT 6.4*  ALBUMIN 3.7   No results for input(s): LIPASE, AMYLASE in the last 168 hours. No results for input(s): AMMONIA in the last 168 hours. CBC:  Recent Labs Lab 06/16/16 1436 06/20/16 2219 06/21/16 0425 06/22/16 0443  WBC 15.9* 12.2* 21.8* 22.7*  NEUTROABS  --  8.3*  --   --   HGB 14.3 14.9 13.1 12.4*  HCT 44.0 43.8 39.3 35.9*  MCV 90.7 88.8 90.1 87.8  PLT 199 141* 122* 110*   Cardiac Enzymes: No results for input(s): CKTOTAL, CKMB, CKMBINDEX, TROPONINI in the last 168 hours. CBG (last 3)   Recent Labs  06/21/16 2126 06/22/16 0505 06/22/16 0753  GLUCAP 229* 172* 157*   Recent Results (from the past 240 hour(s))  Blood Culture  (routine x 2)     Status: None (Preliminary result)   Collection Time: 06/20/16 10:19 PM  Result Value Ref Range Status   Specimen Description RIGHT ANTECUBITAL  Final   Special Requests BOTTLES DRAWN AEROBIC AND ANAEROBIC [facility] EACH  Final   Culture  Setup Time   Final    Gram Stain Report Called to,Read Back By and Verified With: HAWKINS M. AT 0915 ON [id number] BY THOMPSON S.   Culture NO GROWTH 2 DAYS  Final   Report Status PENDING  Incomplete  Blood Culture (routine x 2)     Status: None (Preliminary result)   Collection Time: 06/20/16 10:33 PM  Result Value Ref Range Status   Specimen Description BLOOD RIGHT ARM  Final   Special Requests BOTTLES DRAWN AEROBIC AND ANAEROBIC 6CC EACH  Final   Culture NO GROWTH 2 DAYS  Final   Report Status PENDING  Incomplete  Urine culture     Status: Abnormal (Preliminary result)   Collection Time: 06/20/16 11:54 PM  Result Value Ref Range Status   Specimen Description URINE, CATHETERIZED  Final   Special Requests NONE  Final   Culture >=100,000 COLONIES/mL GRAM NEGATIVE RODS (A)  Final   Report Status PENDING  Incomplete     Studies: Dg Chest Port 1 View  Result Date: 06/20/2016 CLINICAL DATA:  82 year old male with history of fever. Bleeding from the penis. EXAM: PORTABLE CHEST 1 VIEW COMPARISON:  Chest x-ray 08/23/2006. FINDINGS: There is cephalization of the pulmonary vasculature and slight indistinctness of the interstitial markings suggestive of mild pulmonary edema. A a small left pleural effusion. Mild cardiomegaly. Upper mediastinal contours are within normal limits. Aortic atherosclerosis. IMPRESSION: 1. The appearance the chest is most suggestive of mild congestive heart failure. 2. Aortic atherosclerosis. Electronically Signed   By: Vinnie Langton M.D.   On: 06/20/2016 23:24   Scheduled Meds: . digoxin  0.125 mg Oral Daily  . insulin aspart  0-9 Units Subcutaneous TID WC  . insulin aspart  4 Units Subcutaneous TID WC  . levofloxacin  (LEVAQUIN) IV  750 mg Intravenous Q24H  . warfarin  5 mg Oral Once  . Warfarin - Pharmacist Dosing Inpatient   Does not apply Q24H   Continuous Infusions: . 0.9 % NaCl with KCl 20 mEq / L 75 mL/hr at 06/22/16 0911    Principal Problem:   Sepsis ([place]) Active Problems:   CORONARY ATHEROSCLEROSIS NATIVE CORONARY ARTERY   Atrial fibrillation ([place])   Long term current use of  anticoagulant therapy   Type 2 diabetes mellitus ([facility])   Chronic lymphocytic leukemia ([facility])   Hypoglycemia   Acute metabolic encephalopathy Time spent:   Irwin Brakeman, MD, FAAFP [facility] Pager [phone]  If 7PM-7AM, please contact night-coverage [email or web address] Password [email or web address] 06/22/2016, 11:02 AM    LOS: 1 day

## 2016-06-22 NOTE — Care Management Note (Signed)
Case Management Note  Patient Details  Name: [name] [name] MRN: [id number] Date of Birth: Jun 25, 1934  Subjective/Objective:                  Pt admitted with sepsis. Pt from home, lives with wife and is ind with ADL's. Pt has PCP, drives himself to appointments and has no difficulty affording or managing medications. Pt's wife at bedside. Pt has walker to use as needed when his knee bothers him.   Action/Plan: Pt plans to return home with self care.   Expected Discharge Date:      06/25/2016            Expected Discharge Plan:  Home/Self Care  In-House Referral:  NA  Discharge planning Services  CM Consult  Post Acute Care Choice:  NA Choice offered to:  NA  Status of Service:  Completed, signed off    Sherald Barge, RN 06/22/2016, 2:39 PM

## 2016-06-23 ENCOUNTER — Inpatient Hospital Stay (HOSPITAL_COMMUNITY): Payer: Medicare Other

## 2016-06-23 DIAGNOSIS — B9620 Unspecified Escherichia coli [E. coli] as the cause of diseases classified elsewhere: Secondary | ICD-10-CM

## 2016-06-23 DIAGNOSIS — R310 Gross hematuria: Secondary | ICD-10-CM

## 2016-06-23 DIAGNOSIS — A4151 Sepsis due to Escherichia coli [E. coli]: Secondary | ICD-10-CM

## 2016-06-23 DIAGNOSIS — N390 Urinary tract infection, site not specified: Secondary | ICD-10-CM

## 2016-06-23 LAB — BASIC METABOLIC PANEL
ANION GAP: 7 (ref 5–15)
BUN: 30 mg/dL — ABNORMAL HIGH (ref 6–20)
CALCIUM: 8.5 mg/dL — AB (ref 8.9–10.3)
CO2: 22 mmol/L (ref 22–32)
Chloride: 108 mmol/L (ref 101–111)
Creatinine, Ser: 0.8 mg/dL (ref 0.61–1.24)
GFR calc Af Amer: >60 mL/min (ref >60)
Glucose, Bld: 178 mg/dL — ABNORMAL HIGH (ref 65–99)
POTASSIUM: 3.6 mmol/L (ref 3.5–5.1)
SODIUM: 137 mmol/L (ref 135–145)

## 2016-06-23 LAB — CBC
HEMATOCRIT: 37.4 % — AB (ref 39.0–52.0)
HEMOGLOBIN: 12.7 g/dL — AB (ref 13.0–17.0)
MCH: 29.8 pg (ref 26.0–34.0)
MCHC: 34 g/dL (ref 30.0–36.0)
MCV: 87.8 fL (ref 78.0–100.0)
Platelets: 112 10*3/uL — ABNORMAL LOW (ref 150–400)
RBC: 4.26 MIL/uL (ref 4.22–5.81)
RDW: 15.6 % — ABNORMAL HIGH (ref 11.5–15.5)
WBC: 15.2 10*3/uL — AB (ref 4.0–10.5)

## 2016-06-23 LAB — GLUCOSE, CAPILLARY
GLUCOSE-CAPILLARY: 193 mg/dL — AB (ref 65–99)
GLUCOSE-CAPILLARY: 303 mg/dL — AB (ref 65–99)
GLUCOSE-CAPILLARY: 245 mg/dL — AB (ref 65–99)
GLUCOSE-CAPILLARY: 238 mg/dL — AB (ref 65–99)

## 2016-06-23 LAB — URINE CULTURE

## 2016-06-23 LAB — MAGNESIUM: MAGNESIUM: 1.6 mg/dL — AB (ref 1.7–2.4)

## 2016-06-23 LAB — HEMOGLOBIN A1C
Hgb A1c MFr Bld: 7.7 % — ABNORMAL HIGH (ref 4.8–5.6)
Mean Plasma Glucose: 174 mg/dL

## 2016-06-23 LAB — PROTIME-INR
INR: 1.68
PROTHROMBIN TIME: 20 s — AB (ref 11.4–15.2)

## 2016-06-23 MED ORDER — WARFARIN SODIUM 5 MG PO TABS
6.0000 mg | ORAL_TABLET | Freq: Once | ORAL | Status: AC
  Administered 2016-06-23: 13:00:00 6 mg via ORAL
  Filled 2016-06-23: qty 1

## 2016-06-23 MED ORDER — DEXTROSE 5 % IV SOLN
2.0000 g | INTRAVENOUS | Status: DC
  Administered 2016-06-23 – 2016-06-25 (×3): 2 g via INTRAVENOUS
  Filled 2016-06-23 (×4): qty 2

## 2016-06-23 MED ORDER — INSULIN GLARGINE 100 UNIT/ML ~~LOC~~ SOLN
12.0000 [IU] | Freq: Every day | SUBCUTANEOUS | Status: DC
Start: 2016-06-23 — End: 2016-06-25
  Administered 2016-06-23 – 2016-06-24 (×2): 12 [IU] via SUBCUTANEOUS
  Filled 2016-06-23 (×3): qty 0.12

## 2016-06-23 NOTE — Progress Notes (Signed)
[name] [name] for Coumadin Indication: atrial fibrillation  Allergies  Allergen Reactions  . Penicillins Hives and Other (See Comments)    Has patient had a PCN reaction causing immediate rash, facial/tongue/throat swelling, SOB or lightheadedness with hypotension: No Has patient had a PCN reaction causing severe rash involving mucus membranes or skin necrosis: No Has patient had a PCN reaction that required hospitalization No Has patient had a PCN reaction occurring within the last 10 years: No If all of the above answers are "NO", then may proceed with Cephalosporin use.    Patient Measurements: Height: 5\' 11"  (180.3 cm) Weight: 293 lb 3.4 oz (133 kg) IBW/kg (Calculated) : 75.3  Vital Signs: Temp: 97.9 F (36.6 C) (03/20 0500) Temp Source: Oral (03/20 0500) BP: 100/60 (03/20 0500) Pulse Rate: 90 (03/20 0836)  Labs:  Recent Labs  06/21/16 0425 06/21/16 1029 06/22/16 0443 06/23/16 0452  HGB 13.1  --  12.4* 12.7*  HCT 39.3  --  35.9* 37.4*  PLT 122*  --  110* 112*  LABPROT  --  32.1* 24.1* 20.0*  INR  --  3.04 2.12 1.68  CREATININE 1.15  --  1.03 0.80   Estimated Creatinine Clearance: 104.2 mL/min (by C-G formula based on SCr of 0.8 mg/dL).  Medical History: Past Medical History:  Diagnosis Date  . Atrial fibrillation ([name] [name])   . Chronic lymphocytic leukemia ([name] [name])   . Coronary atherosclerosis of native coronary artery    Nonobstructive  . Essential hypertension, benign   . Hyperlipidemia   . IgA nephropathy    Dr. Lowanda Foster  . Type 2 diabetes mellitus ([name] [name])   . UTI (urinary tract infection) july  10 th   taking  med    Medications:  Prescriptions Prior to Admission  Medication Sig Dispense Refill Last Dose  . acetaminophen (TYLENOL) 650 MG CR tablet Take 650 mg by mouth every 8 (eight) hours as needed for pain.   Past Week at Unknown time  . amLODipine (NORVASC) 5 MG tablet Take 5 mg by mouth daily.    06/20/2016 at Unknown  time  . Ascorbic Acid (VITAMIN C) 1000 MG tablet Take 1,000 mg by mouth daily.    06/20/2016 at Unknown time  . atorvastatin (LIPITOR) 10 MG tablet Take 10 mg by mouth every evening.   06/20/2016 at Unknown time  . Calcium Carbonate-Vitamin D (CALCIUM 600+D) 600-400 MG-UNIT tablet Take 1 tablet by mouth daily.   06/20/2016 at Unknown time  . cholecalciferol (VITAMIN D) 1000 UNITS tablet Take 1,000 Units by mouth daily.    06/20/2016 at Unknown time  . ciclopirox (LOPROX) 0.77 % cream Apply 1 application topically daily as needed (for rash on foot).   Past Week at Unknown time  . Coenzyme Q10 (CO Q-10) 100 MG CAPS Take 200 mg by mouth daily.    06/20/2016 at Unknown time  . digoxin (LANOXIN) 0.125 MG tablet Take 0.125 mg by mouth daily.   06/20/2016 at unsure  . glimepiride (AMARYL) 4 MG tablet Take 4 mg by mouth 2 (two) times daily.   06/20/2016 at Unknown time  . hydrochlorothiazide 25 MG tablet Take 25 mg by mouth daily.    06/20/2016 at Unknown time  . insulin aspart (NOVOLOG FLEXPEN) 100 UNIT/ML FlexPen Inject 4-18 Units into the skin 2 (two) times daily before a meal. Pt uses per sliding scale before lunch and dinner.   06/20/2016 at Unknown time  . insulin detemir (LEVEMIR FLEXPEN) 100 UNIT/ML injection Inject  50 Units into the skin daily.    06/20/2016 at Unknown time  . Liraglutide (VICTOZA) 18 MG/3ML SOLN Inject 1.8 mg into the skin daily.    06/20/2016 at Unknown time  . lisinopril (PRINIVIL,ZESTRIL) 40 MG tablet Take 40 mg by mouth every evening.    06/20/2016 at Unknown time  . LORazepam (ATIVAN) 1 MG tablet Take 2 mg by mouth at bedtime.    06/19/2016 at Unknown time  . Multiple Vitamin (MULITIVITAMIN WITH MINERALS) TABS Take 1 tablet by mouth daily.    06/20/2016 at Unknown time  . Omega-3 Fatty Acids (FISH OIL) 1200 MG CAPS Take 1,200 mg by mouth 2 (two) times daily.    06/20/2016 at Unknown time  . pioglitazone (ACTOS) 30 MG tablet Take 30 mg by mouth every evening.    06/19/2016 at Unknown time  .  vitamin E 400 UNIT capsule Take 400 Units by mouth daily.    06/20/2016 at Unknown time  . warfarin (COUMADIN) 5 MG tablet Take 5 mg by mouth every evening.   06/20/2016 at 1800   Assessment: Continuation of coumadin PTA for AFIB. INR below goal, hematuria resolving per urology.  Anticoagulation Dose Instructions as of 06/09/2016    Total Sun Mon Tue Wed Thu Fri Sat  New Dose 35 mg 5 mg 5 mg 5 mg 5 mg 5 mg 5 mg 5 mg    (5 mg x 1) (5 mg x 1) (5 mg x 1) (5 mg x 1) (5 mg x 1) (5 mg x 1) (5 mg x 1)   Goal of Therapy:  INR 2-3   Plan:  Coumadin 6 mg po x 1 dose today INR/PT daily  Pricilla [name] 06/23/2016,12:00 PM

## 2016-06-23 NOTE — Consult Note (Signed)
Urology Consult  Consulting MD: Irwin Brakeman, M.D.  CC: Blood in urine, UTI  HPI: This is a 82 year old male with diabetes and multiple medical issues, followed by Dr. Junious Silk in our [place] office.  He has a remote history of TURP , apparently, of about 12 years ago here in [place].  The patient has apparently been on out for blockers in the past, but I cannot see recent use of this medication.  His residual urine volume measured within the past 2 years.  Was approximately 120 mL.  Per the patient states that usually he empties fairly well.  He has mild to moderate voiding symptomatology.  He apparently was treated for urinary tract infection in July 2017.  He was recently admitted with gross hematuria, urinary tract infection and probable sepsis.  Since his hospitalization/treatment, his hematuria has resolved.  He is voiding clear urine by report.  He is growing Escherichia coli.  Urologic consultation is requested.  PMH: Past Medical History:  Diagnosis Date  . Atrial fibrillation ([place])   . Chronic lymphocytic leukemia ([place])   . Coronary atherosclerosis of native coronary artery    Nonobstructive  . Essential hypertension, benign   . Hyperlipidemia   . IgA nephropathy    Dr. Lowanda Foster  . Type 2 diabetes mellitus ([place])   . UTI (urinary tract infection) july  10 th   taking  med     PSH: Past Surgical History:  Procedure Laterality Date  . AMPUTATION Left 09/15/2012   Procedure: PARTIAL AMPUTATION 3rd TOE LEFT FOOT;  Surgeon: Marcheta Grammes, DPM;  Location: [facility];  Service: Orthopedics;  Laterality: Left;  . AMPUTATION Left 04/20/2013   Procedure: PARTIAL AMPUTATION 2ND TOE LEFT FOOT;  Surgeon: Marcheta Grammes, DPM;  Location: [facility];  Service: Orthopedics;  Laterality: Left;  . ANAL FISSURE REPAIR    . CATARACT EXTRACTION W/PHACO Right 09/30/2015   Procedure: CATARACT EXTRACTION PHACO AND INTRAOCULAR LENS PLACEMENT (IOC);  Surgeon: Tonny Branch, MD;   Location: [facility];  Service: Ophthalmology;  Laterality: Right;  CDE: 11.78  . CATARACT EXTRACTION W/PHACO Left 10/31/2015   Procedure: CATARACT EXTRACTION PHACO AND INTRAOCULAR LENS PLACEMENT LEFT EYE; CDE:  9.10;  Surgeon: Tonny Branch, MD;  Location: [facility];  Service: Ophthalmology;  Laterality: Left;  . COLONOSCOPY  08/19/2011   Procedure: COLONOSCOPY;  Surgeon: Rogene Houston, MD;  Location: [facility];  Service: Endoscopy;  Laterality: N/A;  930  . COLONOSCOPY N/A 09/19/2014   Procedure: COLONOSCOPY;  Surgeon: Rogene Houston, MD;  Location: [facility];  Service: Endoscopy;  Laterality: N/A;  1055  . FEMORAL HERNIA REPAIR    . Open repair left quadriceps tendon.  08/22/07   Dr. Aline Brochure  . Right total knee replacement    . TRANSURETHRAL RESECTION OF PROSTATE      Allergies: Allergies  Allergen Reactions  . Penicillins Hives and Other (See Comments)    Has patient had a PCN reaction causing immediate rash, facial/tongue/throat swelling, SOB or lightheadedness with hypotension: No Has patient had a PCN reaction causing severe rash involving mucus membranes or skin necrosis: No Has patient had a PCN reaction that required hospitalization No Has patient had a PCN reaction occurring within the last 10 years: No If all of the above answers are "NO", then may proceed with Cephalosporin use.     Medications: Prescriptions Prior to Admission  Medication Sig Dispense Refill Last Dose  . acetaminophen (TYLENOL) 650 MG CR tablet Take 650  mg by mouth every 8 (eight) hours as needed for pain.   Past Week at Unknown time  . amLODipine (NORVASC) 5 MG tablet Take 5 mg by mouth daily.    06/20/2016 at Unknown time  . Ascorbic Acid (VITAMIN C) 1000 MG tablet Take 1,000 mg by mouth daily.    06/20/2016 at Unknown time  . atorvastatin (LIPITOR) 10 MG tablet Take 10 mg by mouth every evening.   06/20/2016 at Unknown time  . Calcium Carbonate-Vitamin D (CALCIUM 600+D) 600-400 MG-UNIT tablet Take 1  tablet by mouth daily.   06/20/2016 at Unknown time  . cholecalciferol (VITAMIN D) 1000 UNITS tablet Take 1,000 Units by mouth daily.    06/20/2016 at Unknown time  . ciclopirox (LOPROX) 0.77 % cream Apply 1 application topically daily as needed (for rash on foot).   Past Week at Unknown time  . Coenzyme Q10 (CO Q-10) 100 MG CAPS Take 200 mg by mouth daily.    06/20/2016 at Unknown time  . digoxin (LANOXIN) 0.125 MG tablet Take 0.125 mg by mouth daily.   06/20/2016 at unsure  . glimepiride (AMARYL) 4 MG tablet Take 4 mg by mouth 2 (two) times daily.   06/20/2016 at Unknown time  . hydrochlorothiazide 25 MG tablet Take 25 mg by mouth daily.    06/20/2016 at Unknown time  . insulin aspart (NOVOLOG FLEXPEN) 100 UNIT/ML FlexPen Inject 4-18 Units into the skin 2 (two) times daily before a meal. Pt uses per sliding scale before lunch and dinner.   06/20/2016 at Unknown time  . insulin detemir (LEVEMIR FLEXPEN) 100 UNIT/ML injection Inject 50 Units into the skin daily.    06/20/2016 at Unknown time  . Liraglutide (VICTOZA) 18 MG/3ML SOLN Inject 1.8 mg into the skin daily.    06/20/2016 at Unknown time  . lisinopril (PRINIVIL,ZESTRIL) 40 MG tablet Take 40 mg by mouth every evening.    06/20/2016 at Unknown time  . LORazepam (ATIVAN) 1 MG tablet Take 2 mg by mouth at bedtime.    06/19/2016 at Unknown time  . Multiple Vitamin (MULITIVITAMIN WITH MINERALS) TABS Take 1 tablet by mouth daily.    06/20/2016 at Unknown time  . Omega-3 Fatty Acids (FISH OIL) 1200 MG CAPS Take 1,200 mg by mouth 2 (two) times daily.    06/20/2016 at Unknown time  . pioglitazone (ACTOS) 30 MG tablet Take 30 mg by mouth every evening.    06/19/2016 at Unknown time  . vitamin E 400 UNIT capsule Take 400 Units by mouth daily.    06/20/2016 at Unknown time  . warfarin (COUMADIN) 5 MG tablet Take 5 mg by mouth every evening.   06/20/2016 at 1800     Social History: Social History   Social History  . Marital status: Married    Spouse name: N/A  .  Number of children: N/A  . Years of education: N/A   Occupational History  . Retired Retired    Curator   Social History Main Topics  . Smoking status: Former Smoker    Years: 10.00    Types: Cigarettes    Quit date: 04/07/1963  . Smokeless tobacco: Never Used  . Alcohol use No  . Drug use: No  . Sexual activity: Not on file   Other Topics Concern  . Not on file   Social History Narrative  . No narrative on file    Family History: Family History  Problem Relation Age of Onset  . Atrial fibrillation Mother  Review of Systems: Positive: Gross hematuria, resolved.  Moderate lower urinary tract symptomatology.  Weakness, joint stiffness. Negative:   A further 10 point review of systems was negative except what is listed in the HPI.  Physical Exam: @VITALS2 @ General: No acute distress.  Awake. Head:  Normocephalic.  Atraumatic. ENT:  EOMI.  Mucous membranes moist Neck:  Supple.  No lymphadenopathy. CV:  S1 present. S2 present. Regular rate. Pulmonary: Equal effort bilaterally.  Clear to auscultation bilaterally. Abdomen: Soft.  Non- tender to palpation. Skin:  Normal turgor.  No visible rash. Extremity: No gross deformity of bilateral upper extremities.  No gross deformity of    bilateral lower extremities. Neurologic: Alert. Appropriate mood.    Studies:  Recent Labs     06/22/16  0443  06/23/16  0452  HGB  12.4*  12.7*  WBC  22.7*  15.2*  PLT  110*  112*    Recent Labs     06/22/16  0443  06/23/16  0452  NA  136  137  K  3.1*  3.6  CL  107  108  CO2  22  22  BUN  38*  30*  CREATININE  1.03  0.80  CALCIUM  8.6*  8.5*  GFRNONAA  >60  >60  GFRAA  >60  >60     Recent Labs     06/22/16  0443  06/23/16  0452  INR  2.12  1.68     Invalid input(s): ABG  Laboratories above and urine culture have been reviewed.  Assessment:  1.  Urinary tract infection, properly treated.  Possible sepsis.  2.  Hematuria, resolving  3.  History of bladder  outlet obstruction, status post TURP approximately 12-13 years ago.  Rule out improper emptying  Plan: 1.  I agree with current medical therapy.  2.  I will order renal ultrasound to assure proper bladder emptying, and rule out upper tract abnormalities.  3.  No other current recommendations, but we will follow    Pager:[phone]

## 2016-06-23 NOTE — Progress Notes (Signed)
Pharmacy Antibiotic Note  [name] [name] is a 82 y.o. male admitted on 06/20/2016 with sepsis, UTI and bactermia (E.Coli).  Pharmacy has been consulted for Rocephin  Penicillin allergy noted (hives).  Patient reported potentially taking Keflex in the past for UTI, but this could not be validated with retail pharmacy.  There records only had recent ciprofloxacin.  E.Coli in urine resistant to quinolones.  Blood culture sensitivies pending.  Discussed with MD, will transition Rocephin dosing.  Monitor closely for reaction.  Plan: Rocephin 2gm IV every 24 hours. Labs per protocol  Height: 5\' 11"  (180.3 cm) Weight: 293 lb 3.4 oz (133 kg) IBW/kg (Calculated) : 75.3  Temp (24hrs), Avg:98.3 F (36.8 C), Min:97.9 F (36.6 C), Max:98.8 F (37.1 C)   Recent Labs Lab 06/16/16 1436 06/20/16 2219 06/20/16 2252 06/21/16 0109 06/21/16 0112 06/21/16 0425 06/22/16 0443 06/23/16 0452  WBC 15.9* 12.2*  --   --   --  21.8* 22.7* 15.2*  CREATININE  --  1.09  --   --   --  1.15 1.03 0.80  LATICACIDVEN  --   --  3.67* 3.1* 3.66* 3.6* 1.3  --     Estimated Creatinine Clearance: 104.2 mL/min (by C-G formula based on SCr of 0.8 mg/dL).    Allergies  Allergen Reactions  . Penicillins Hives and Other (See Comments)    Has patient had a PCN reaction causing immediate rash, facial/tongue/throat swelling, SOB or lightheadedness with hypotension: No Has patient had a PCN reaction causing severe rash involving mucus membranes or skin necrosis: No Has patient had a PCN reaction that required hospitalization No Has patient had a PCN reaction occurring within the last 10 years: No If all of the above answers are "NO", then may proceed with Cephalosporin use.     Antimicrobials this admission: Azactam 3/18 >> 3/19 Vancomycin 3/17 >> 3/18 Levaquin 3/17 >>3/20 Rocephin 3/20 >>  Thank you for allowing pharmacy to be a part of this patient's care.  Pricilla Larsson 06/23/2016 11:05 AM

## 2016-06-23 NOTE — Progress Notes (Signed)
PROGRESS NOTE    [name] [name]  FAO:[id number]  DOB: 1934/07/13  DOA: 06/20/2016 PCP: [name], MD Outpatient Specialists:   Hospital course: [name] [name] is a 82 y.o. male with medical history significant of afib, DM, CAD, CLL lives at home with wife comes in tonight after not being able to get up and stand up.  He reports he went to pee and it was bloody.  Family noted he was very confused also.  In the ED on arrival his temp was over 101.  He denies any cough or respiratory symptoms.  Denies any sob, denies any dysuria or change in urinary stream.  No rashes.  He did vomit earlier once, no diarrhea.  No abdominal pain or chest pain.  Pt sugar was also in the 60s.  He was given some ivf, glucose in the ED and his confusion has resolved.  Pt referred for admission for likely sepsis.  Assessment & Plan:   Gram negative Sepsis due to E coli  -Blood culture and urine culture growing e coli, sensitivities pending, continue IV levofloxacin for now, Pt is afebrile and hemodynamics have improved.  Because of urinary source and penile bleeding on presentation, requested a urology consultation, Pt goes to [facility] urology as a patient, will be in [facility] 3/20.   Continue IV levaquin.  Follow cultures.  Continue supportive therapy. Clinically patient is feeling better.    Hypoglycemia on admission - likely secondary to sepsis and poor oral intake at admission, follow closely but it appears to be resolved now.   Atrial fibrillation - chronic, rate controlled, following, pharmacy assisting with warfarin management in the hospital.    Type 2 diabetes mellitus, now with hyperglycemia - see orders for adding prandial coverage, continue supplemental coverage. Added low dose lantus 3/20.    CLL - stable - follow up with hem/onc after discharge.   Leukocytosis - WBC trending down today.    DVT prophylaxis: warfarin Code Status: full  Family Communication: wife Disposition Plan: home    Subjective: Pt without complaints.    Objective: Vitals:   06/22/16 2248 06/23/16 0500 06/23/16 0730 06/23/16 0836  BP: (!) 107/51 100/60    Pulse: 90 80  90  Resp: 17 15    Temp: 98.8 F (37.1 C) 97.9 F (36.6 C)    TempSrc: Oral Oral    SpO2: 100% 100% 96%   Weight:  133 kg (293 lb 3.4 oz)    Height:        Intake/Output Summary (Last 24 hours) at 06/23/16 0847 Last data filed at 06/23/16 0730  Gross per 24 hour  Intake             1020 ml  Output              800 ml  Net              220 ml   Filed Weights   06/21/16 0200 06/22/16 0543 06/23/16 0500  Weight: 130.6 kg (288 lb) 131.6 kg (290 lb 2 oz) 133 kg (293 lb 3.4 oz)    Exam:  General exam: awake, alert, Nad. Cooperative.  Respiratory system: No increased work of breathing. Cardiovascular system: S1 & S2 heard.  Gastrointestinal system: Abdomen is nondistended, soft and nontender. Normal bowel sounds heard. Central nervous system: Alert and oriented. No focal neurological deficits. Extremities: no cyanosis.  Data Reviewed: Basic Metabolic Panel:  Recent Labs Lab 06/20/16 2219 06/21/16 0425 06/22/16 0443 06/23/16 0452  NA 137  134* 136 137  K 3.3* 3.4* 3.1* 3.6  CL 100* 102 107 108  CO2 27 22 22 22   GLUCOSE 62* 137* 172* 178*  BUN 26* 26* 38* 30*  CREATININE 1.09 1.15 1.03 0.80  CALCIUM 10.0 8.6* 8.6* 8.5*  MG  --   --  1.4* 1.6*   Liver Function Tests:  Recent Labs Lab 06/20/16 2219  AST 41  ALT 21  ALKPHOS 122  BILITOT 2.3*  PROT 6.4*  ALBUMIN 3.7   No results for input(s): LIPASE, AMYLASE in the last 168 hours. No results for input(s): AMMONIA in the last 168 hours. CBC:  Recent Labs Lab 06/16/16 1436 06/20/16 2219 06/21/16 0425 06/22/16 0443 06/23/16 0452  WBC 15.9* 12.2* 21.8* 22.7* 15.2*  NEUTROABS  --  8.3*  --   --   --   HGB 14.3 14.9 13.1 12.4* 12.7*  HCT 44.0 43.8 39.3 35.9* 37.4*  MCV 90.7 88.8 90.1 87.8 87.8  PLT 199 141* 122* 110* 112*   Cardiac  Enzymes: No results for input(s): CKTOTAL, CKMB, CKMBINDEX, TROPONINI in the last 168 hours. CBG (last 3)   Recent Labs  06/22/16 1608 06/22/16 2229 06/23/16 0751  GLUCAP 286* 220* 193*   Recent Results (from the past 240 hour(s))  Blood Culture (routine x 2)     Status: Abnormal (Preliminary result)   Collection Time: 06/20/16 10:19 PM  Result Value Ref Range Status   Specimen Description RIGHT ANTECUBITAL  Final   Special Requests BOTTLES DRAWN AEROBIC AND ANAEROBIC [facility] EACH  Final   Culture  Setup Time   Final    Gram Stain Report Called to,Read Back By and Verified With: HAWKINS M. AT 0915 ON [id number] BY THOMPSON S.   Culture (A)  Final    ESCHERICHIA COLI SUSCEPTIBILITIES TO FOLLOW Performed at [facility], [place] [address] [place], [place] [address]    Report Status PENDING  Incomplete  Blood Culture ID Panel (Reflexed)     Status: Abnormal   Collection Time: 06/20/16 10:19 PM  Result Value Ref Range Status   Enterococcus species NOT DETECTED NOT DETECTED Final   Listeria monocytogenes NOT DETECTED NOT DETECTED Final   Staphylococcus species NOT DETECTED NOT DETECTED Final   Staphylococcus aureus NOT DETECTED NOT DETECTED Final   Streptococcus species NOT DETECTED NOT DETECTED Final   Streptococcus agalactiae NOT DETECTED NOT DETECTED Final   Streptococcus pneumoniae NOT DETECTED NOT DETECTED Final   Streptococcus pyogenes NOT DETECTED NOT DETECTED Final   Acinetobacter baumannii NOT DETECTED NOT DETECTED Final   Enterobacteriaceae species DETECTED (A) NOT DETECTED Final    Comment: Enterobacteriaceae represent a large family of gram-negative bacteria, not a single organism. CRITICAL RESULT CALLED TO, READ BACK BY AND VERIFIED WITH: B. [employer].D. 14:30 06/22/16 ([email or web address])    Enterobacter cloacae complex NOT DETECTED NOT DETECTED Final   Escherichia coli DETECTED (A) NOT DETECTED Final    Comment: CRITICAL RESULT CALLED TO, READ BACK BY AND VERIFIED WITH: B.  [employer].D. 14:30 06/22/16 (wilsonm)    Klebsiella oxytoca NOT DETECTED NOT DETECTED Final   Klebsiella pneumoniae NOT DETECTED NOT DETECTED Final   Proteus species NOT DETECTED NOT DETECTED Final   Serratia marcescens NOT DETECTED NOT DETECTED Final   Carbapenem resistance NOT DETECTED NOT DETECTED Final   Haemophilus influenzae NOT DETECTED NOT DETECTED Final   Neisseria meningitidis NOT DETECTED NOT DETECTED Final   Pseudomonas aeruginosa NOT DETECTED NOT DETECTED Final   Candida albicans NOT DETECTED NOT  DETECTED Final   Candida glabrata NOT DETECTED NOT DETECTED Final   Candida krusei NOT DETECTED NOT DETECTED Final   Candida parapsilosis NOT DETECTED NOT DETECTED Final   Candida tropicalis NOT DETECTED NOT DETECTED Final    Comment: Performed at [facility], [place] [address], [place], [place] [address]  Blood Culture (routine x 2)     Status: None (Preliminary result)   Collection Time: 06/20/16 10:33 PM  Result Value Ref Range Status   Specimen Description BLOOD RIGHT ARM  Final   Special Requests BOTTLES DRAWN AEROBIC AND ANAEROBIC 6CC EACH  Final   Culture NO GROWTH 3 DAYS  Final   Report Status PENDING  Incomplete  Urine culture     Status: Abnormal (Preliminary result)   Collection Time: 06/20/16 11:54 PM  Result Value Ref Range Status   Specimen Description URINE, CATHETERIZED  Final   Special Requests NONE  Final   Culture >=100,000 COLONIES/mL ESCHERICHIA COLI (A)  Final   Report Status PENDING  Incomplete     Studies: No results found. Scheduled Meds: . digoxin  0.125 mg Oral Daily  . insulin aspart  0-9 Units Subcutaneous TID WC  . insulin aspart  8 Units Subcutaneous TID WC  . insulin glargine  12 Units Subcutaneous QHS  . levofloxacin (LEVAQUIN) IV  750 mg Intravenous Q24H  . Warfarin - Pharmacist Dosing Inpatient   Does not apply Q24H   Continuous Infusions:  Principal Problem:   Sepsis ([place]) Active Problems:   CORONARY ATHEROSCLEROSIS NATIVE  CORONARY ARTERY   Atrial fibrillation ([facility])   Long term current use of anticoagulant therapy   Type 2 diabetes mellitus ([facility])   Chronic lymphocytic leukemia ([facility])   Hypoglycemia   Acute metabolic encephalopathy Time spent:   Irwin Brakeman, MD, FAAFP [facility] Pager [phone]  If 7PM-7AM, please contact night-coverage [email or web address] Password [facility] 06/23/2016, 8:47 AM    LOS: 2 days

## 2016-06-24 DIAGNOSIS — R310 Gross hematuria: Secondary | ICD-10-CM

## 2016-06-24 DIAGNOSIS — N390 Urinary tract infection, site not specified: Secondary | ICD-10-CM

## 2016-06-24 LAB — PROTIME-INR
INR: 1.88
PROTHROMBIN TIME: 21.9 s — AB (ref 11.4–15.2)

## 2016-06-24 LAB — CBC
HCT: 37.5 % — ABNORMAL LOW (ref 39.0–52.0)
Hemoglobin: 12.8 g/dL — ABNORMAL LOW (ref 13.0–17.0)
MCH: 30 pg (ref 26.0–34.0)
MCHC: 34.1 g/dL (ref 30.0–36.0)
MCV: 88 fL (ref 78.0–100.0)
Platelets: 113 10*3/uL — ABNORMAL LOW (ref 150–400)
RBC: 4.26 MIL/uL (ref 4.22–5.81)
RDW: 15.5 % (ref 11.5–15.5)
WBC: 14.1 10*3/uL — ABNORMAL HIGH (ref 4.0–10.5)

## 2016-06-24 LAB — BASIC METABOLIC PANEL
Anion gap: 6 (ref 5–15)
BUN: 22 mg/dL — AB (ref 6–20)
CALCIUM: 8.3 mg/dL — AB (ref 8.9–10.3)
CO2: 23 mmol/L (ref 22–32)
CREATININE: 0.68 mg/dL (ref 0.61–1.24)
Chloride: 106 mmol/L (ref 101–111)
GFR calc Af Amer: >60 mL/min (ref >60)
GFR calc non Af Amer: >60 mL/min (ref >60)
GLUCOSE: 201 mg/dL — AB (ref 65–99)
Potassium: 3.5 mmol/L (ref 3.5–5.1)
Sodium: 135 mmol/L (ref 135–145)

## 2016-06-24 LAB — GLUCOSE, CAPILLARY
Glucose-Capillary: 163 mg/dL — ABNORMAL HIGH (ref 65–99)
Glucose-Capillary: 187 mg/dL — ABNORMAL HIGH (ref 65–99)
Glucose-Capillary: 311 mg/dL — ABNORMAL HIGH (ref 65–99)
Glucose-Capillary: 248 mg/dL — ABNORMAL HIGH (ref 65–99)
Glucose-Capillary: 246 mg/dL — ABNORMAL HIGH (ref 65–99)

## 2016-06-24 LAB — CULTURE, BLOOD (ROUTINE X 2)

## 2016-06-24 MED ORDER — LISINOPRIL 10 MG PO TABS
40.0000 mg | ORAL_TABLET | Freq: Every evening | ORAL | Status: DC
  Administered 2016-06-24: 40 mg via ORAL
  Filled 2016-06-24: qty 4

## 2016-06-24 MED ORDER — WARFARIN SODIUM 5 MG PO TABS
5.0000 mg | ORAL_TABLET | Freq: Once | ORAL | Status: AC
  Administered 2016-06-24: 5 mg via ORAL
  Filled 2016-06-24: qty 1

## 2016-06-24 MED ORDER — LORAZEPAM 1 MG PO TABS
2.0000 mg | ORAL_TABLET | Freq: Every day | ORAL | Status: DC
  Administered 2016-06-24: 2 mg via ORAL
  Filled 2016-06-24: qty 2

## 2016-06-24 MED ORDER — AMLODIPINE BESYLATE 5 MG PO TABS
5.0000 mg | ORAL_TABLET | Freq: Every day | ORAL | Status: DC
  Administered 2016-06-24 – 2016-06-25 (×2): 5 mg via ORAL
  Filled 2016-06-24 (×2): qty 1

## 2016-06-24 MED ORDER — ATORVASTATIN CALCIUM 10 MG PO TABS
10.0000 mg | ORAL_TABLET | Freq: Every evening | ORAL | Status: DC
  Administered 2016-06-24: 10 mg via ORAL
  Filled 2016-06-24: qty 1

## 2016-06-24 NOTE — Progress Notes (Signed)
[place] for Coumadin Indication: atrial fibrillation  Allergies  Allergen Reactions  . Penicillins Hives and Other (See Comments)    Has patient had a PCN reaction causing immediate rash, facial/tongue/throat swelling, SOB or lightheadedness with hypotension: No Has patient had a PCN reaction causing severe rash involving mucus membranes or skin necrosis: No Has patient had a PCN reaction that required hospitalization No Has patient had a PCN reaction occurring within the last 10 years: No If all of the above answers are "NO", then may proceed with Cephalosporin use.    Patient Measurements: Height: 5\' 11"  (180.3 cm) Weight: 288 lb 12.8 oz (131 kg) IBW/kg (Calculated) : 75.3  Vital Signs: Temp: 98.7 F (37.1 C) (03/21 0500) Temp Source: Oral (03/21 0500) BP: 121/51 (03/21 0500) Pulse Rate: 81 (03/21 0500)  Labs:  Recent Labs  06/22/16 0443 06/23/16 0452 06/24/16 0549  HGB 12.4* 12.7* 12.8*  HCT 35.9* 37.4* 37.5*  PLT 110* 112* 113*  LABPROT 24.1* 20.0* 21.9*  INR 2.12 1.68 1.88  CREATININE 1.03 0.80 0.68   Estimated Creatinine Clearance: 103.4 mL/min (by C-G formula based on SCr of 0.68 mg/dL).  Medical History: Past Medical History:  Diagnosis Date  . Atrial fibrillation ([place])   . Chronic lymphocytic leukemia ([place])   . Coronary atherosclerosis of native coronary artery    Nonobstructive  . Essential hypertension, benign   . Hyperlipidemia   . IgA nephropathy    Dr. Lowanda Foster  . Type 2 diabetes mellitus ([place])   . UTI (urinary tract infection) july  10 th   taking  med    Medications:  Prescriptions Prior to Admission  Medication Sig Dispense Refill Last Dose  . acetaminophen (TYLENOL) 650 MG CR tablet Take 650 mg by mouth every 8 (eight) hours as needed for pain.   Past Week at Unknown time  . amLODipine (NORVASC) 5 MG tablet Take 5 mg by mouth daily.    06/20/2016 at Unknown time  . Ascorbic Acid (VITAMIN C) 1000 MG  tablet Take 1,000 mg by mouth daily.    06/20/2016 at Unknown time  . atorvastatin (LIPITOR) 10 MG tablet Take 10 mg by mouth every evening.   06/20/2016 at Unknown time  . Calcium Carbonate-Vitamin D (CALCIUM 600+D) 600-400 MG-UNIT tablet Take 1 tablet by mouth daily.   06/20/2016 at Unknown time  . cholecalciferol (VITAMIN D) 1000 UNITS tablet Take 1,000 Units by mouth daily.    06/20/2016 at Unknown time  . ciclopirox (LOPROX) 0.77 % cream Apply 1 application topically daily as needed (for rash on foot).   Past Week at Unknown time  . Coenzyme Q10 (CO Q-10) 100 MG CAPS Take 200 mg by mouth daily.    06/20/2016 at Unknown time  . digoxin (LANOXIN) 0.125 MG tablet Take 0.125 mg by mouth daily.   06/20/2016 at unsure  . glimepiride (AMARYL) 4 MG tablet Take 4 mg by mouth 2 (two) times daily.   06/20/2016 at Unknown time  . hydrochlorothiazide 25 MG tablet Take 25 mg by mouth daily.    06/20/2016 at Unknown time  . insulin aspart (NOVOLOG FLEXPEN) 100 UNIT/ML FlexPen Inject 4-18 Units into the skin 2 (two) times daily before a meal. Pt uses per sliding scale before lunch and dinner.   06/20/2016 at Unknown time  . insulin detemir (LEVEMIR FLEXPEN) 100 UNIT/ML injection Inject 50 Units into the skin daily.    06/20/2016 at Unknown time  . Liraglutide (VICTOZA) 18 MG/3ML SOLN  Inject 1.8 mg into the skin daily.    06/20/2016 at Unknown time  . lisinopril (PRINIVIL,ZESTRIL) 40 MG tablet Take 40 mg by mouth every evening.    06/20/2016 at Unknown time  . LORazepam (ATIVAN) 1 MG tablet Take 2 mg by mouth at bedtime.    06/19/2016 at Unknown time  . Multiple Vitamin (MULITIVITAMIN WITH MINERALS) TABS Take 1 tablet by mouth daily.    06/20/2016 at Unknown time  . Omega-3 Fatty Acids (FISH OIL) 1200 MG CAPS Take 1,200 mg by mouth 2 (two) times daily.    06/20/2016 at Unknown time  . pioglitazone (ACTOS) 30 MG tablet Take 30 mg by mouth every evening.    06/19/2016 at Unknown time  . vitamin E 400 UNIT capsule Take 400 Units  by mouth daily.    06/20/2016 at Unknown time  . warfarin (COUMADIN) 5 MG tablet Take 5 mg by mouth every evening.   06/20/2016 at 1800   Assessment: Continuation of coumadin PTA for AFIB. INR below goal but trending up, hematuria resolving per urology note on 3/20.  H/H stable, low pltc (stable)  Anticoagulation Dose Instructions as of 06/09/2016    Total Sun Mon Tue Wed Thu Fri Sat  New Dose 35 mg 5 mg 5 mg 5 mg 5 mg 5 mg 5 mg 5 mg    (5 mg x 1) (5 mg x 1) (5 mg x 1) (5 mg x 1) (5 mg x 1) (5 mg x 1) (5 mg x 1)   Goal of Therapy:  INR 2-3   Plan:  Coumadin 5 mg po x 1 dose today INR/PT daily  Pricilla Larsson 06/24/2016,8:46 AM

## 2016-06-24 NOTE — Progress Notes (Signed)
Inpatient Diabetes Program Recommendations  AACE/ADA: New Consensus Statement on Inpatient Glycemic Control (2015)  Target Ranges:  Prepandial:   less than 140 mg/dL      Peak postprandial:   less than 180 mg/dL (1-2 hours)      Critically ill patients:  140 - 180 mg/dL  Results for ISHAM, SMITHERMAN (MRN [id number]) as of 06/24/2016 07:24  Ref. Range 06/23/2016 07:51 06/23/2016 12:55 06/23/2016 16:28 06/23/2016 20:53 06/24/2016 03:06  Glucose-Capillary Latest Ref Range: 65 - 99 mg/dL 193 (H) 303 (H) 245 (H) 238 (H) 163 (H)    Review of Glycemic Control Diabetes history: DM2 Outpatient Diabetes medications: Amaryl 4 mg BID, Levemir 50 units daily, Novolog 4-18 units BID (lunch and supper), Actos 30 mg QPM, Victoza 1.8 mg daily Current orders for Inpatient glycemic control: Novolog 0-9 units TID with meals, Lantus 12 units QHS, Novolog 8 units TID with meals for meal coverage  Inpatient Diabetes Program Recommendations: Correction (SSI): Please consider ordering Novolog bedtime correction scale. Insulin - Meal Coverage: Please consider increasing meal coverage to Novolog 12 units TID with meals for meal coverage.  Thanks, Barnie Alderman, RN, MSN, CDE Diabetes Coordinator Inpatient Diabetes Program [phone] (Team Pager from 8am to 5pm)

## 2016-06-24 NOTE — Progress Notes (Signed)
PROGRESS NOTE    AKEEM HEPPLER  YYT:[id number]  DOB: Jan 03, 1935  DOA: 06/20/2016 PCP: [name], MD Outpatient Specialists:   Hospital course: KEYNAN HEFFERN is a 82 y.o. male with medical history significant of afib, DM, CAD, CLL lives at home with wife comes in tonight after not being able to get up and stand up.  He reports he went to pee and it was bloody.  Family noted he was very confused also.  In the ED on arrival his temp was over 101.  He denies any cough or respiratory symptoms.  Denies any sob, denies any dysuria or change in urinary stream.  No rashes.  He did vomit earlier once, no diarrhea.  No abdominal pain or chest pain.  Pt sugar was also in the 60s.  He was given some ivf, glucose in the ED and his confusion has resolved.  Pt referred for admission for likely sepsis.  Assessment & Plan:   Gram negative Sepsis due to E coli  -Blood culture and urine culture growing e coli,  Pt is afebrile and hemodynamics have improved.  Because of urinary source and penile bleeding on presentation, urology consulted, and ordered renal ultrasound which showed bladder debris and wall thickening. Will discuss further with urology. Check post void residual. Continue IV rocephin.  Continue supportive therapy. Clinically patient is feeling better.    Hypoglycemia on admission - likely secondary to sepsis and poor oral intake at admission, follow closely but it appears to be resolved now.   Atrial fibrillation - chronic, rate controlled, following, pharmacy assisting with warfarin management in the hospital.    Type 2 diabetes mellitus, now with hyperglycemia - see orders for adding prandial coverage, continue supplemental coverage. Added low dose lantus 3/20.  Blood sugars have been stable  CLL - stable - follow up with hem/onc after discharge.   Leukocytosis - WBC trending down today.    DVT prophylaxis: warfarin Code Status: full  Family Communication: wife Disposition Plan: home    Subjective: No difficulty in passing urine. No shortness of breath  Objective: Vitals:   06/23/16 2100 06/24/16 0500 06/24/16 0700 06/24/16 1500  BP: (!) 124/54 (!) 121/51  (!) 138/50  Pulse: 77 81  70  Resp: 18 18  18   Temp: 98.4 F (36.9 C) 98.7 F (37.1 C)  98.4 F (36.9 C)  TempSrc: Oral Oral  Oral  SpO2: 100% 99%  98%  Weight:   131 kg (288 lb 12.8 oz)   Height:        Intake/Output Summary (Last 24 hours) at 06/24/16 1909 Last data filed at 06/24/16 1823  Gross per 24 hour  Intake              770 ml  Output                0 ml  Net              770 ml   Filed Weights   06/22/16 0543 06/23/16 0500 06/24/16 0700  Weight: 131.6 kg (290 lb 2 oz) 133 kg (293 lb 3.4 oz) 131 kg (288 lb 12.8 oz)    Exam:  General exam: awake, alert, Nad. Cooperative.  Respiratory system: No increased work of breathing. Cardiovascular system: S1 & S2 heard.  Gastrointestinal system: Abdomen is nondistended, soft and nontender. Normal bowel sounds heard. Central nervous system: Alert and oriented. No focal neurological deficits. Extremities: no cyanosis.  Data Reviewed: Basic Metabolic Panel:  Recent Labs  Lab 06/20/16 2219 06/21/16 0425 06/22/16 0443 06/23/16 0452 06/24/16 0549  NA 137 134* 136 137 135  K 3.3* 3.4* 3.1* 3.6 3.5  CL 100* 102 107 108 106  CO2 27 22 22 22 23   GLUCOSE 62* 137* 172* 178* 201*  BUN 26* 26* 38* 30* 22*  CREATININE 1.09 1.15 1.03 0.80 0.68  CALCIUM 10.0 8.6* 8.6* 8.5* 8.3*  MG  --   --  1.4* 1.6*  --    Liver Function Tests:  Recent Labs Lab 06/20/16 2219  AST 41  ALT 21  ALKPHOS 122  BILITOT 2.3*  PROT 6.4*  ALBUMIN 3.7   No results for input(s): LIPASE, AMYLASE in the last 168 hours. No results for input(s): AMMONIA in the last 168 hours. CBC:  Recent Labs Lab 06/20/16 2219 06/21/16 0425 06/22/16 0443 06/23/16 0452 06/24/16 0549  WBC 12.2* 21.8* 22.7* 15.2* 14.1*  NEUTROABS 8.3*  --   --   --   --   HGB 14.9 13.1 12.4*  12.7* 12.8*  HCT 43.8 39.3 35.9* 37.4* 37.5*  MCV 88.8 90.1 87.8 87.8 88.0  PLT 141* 122* 110* 112* 113*   Cardiac Enzymes: No results for input(s): CKTOTAL, CKMB, CKMBINDEX, TROPONINI in the last 168 hours. CBG (last 3)   Recent Labs  06/24/16 0751 06/24/16 1135 06/24/16 1624  GLUCAP 187* 311* 248*   Recent Results (from the past 240 hour(s))  Blood Culture (routine x 2)     Status: Abnormal   Collection Time: 06/20/16 10:19 PM  Result Value Ref Range Status   Specimen Description RIGHT ANTECUBITAL  Final   Special Requests BOTTLES DRAWN AEROBIC AND ANAEROBIC [facility] EACH  Final   Culture  Setup Time   Final    Gram Stain Report Called to,Read Back By and Verified With: HAWKINS M. AT 0915 ON [id number] BY THOMPSON S. Performed at [facility], [place] [address] [place], [place] [address]    Culture ESCHERICHIA COLI (A)  Final   Report Status 06/24/2016 FINAL  Final   Organism ID, Bacteria ESCHERICHIA COLI  Final      Susceptibility   Escherichia coli - MIC*    AMPICILLIN >=32 RESISTANT Resistant     CEFAZOLIN <=4 SENSITIVE Sensitive     CEFEPIME <=1 SENSITIVE Sensitive     CEFTAZIDIME <=1 SENSITIVE Sensitive     CEFTRIAXONE <=1 SENSITIVE Sensitive     CIPROFLOXACIN >=4 RESISTANT Resistant     GENTAMICIN <=1 SENSITIVE Sensitive     IMIPENEM <=0.25 SENSITIVE Sensitive     TRIMETH/SULFA <=20 SENSITIVE Sensitive     AMPICILLIN/SULBACTAM 16 INTERMEDIATE Intermediate     PIP/TAZO <=4 SENSITIVE Sensitive     Extended ESBL NEGATIVE Sensitive     * ESCHERICHIA COLI  Blood Culture ID Panel (Reflexed)     Status: Abnormal   Collection Time: 06/20/16 10:19 PM  Result Value Ref Range Status   Enterococcus species NOT DETECTED NOT DETECTED Final   Listeria monocytogenes NOT DETECTED NOT DETECTED Final   Staphylococcus species NOT DETECTED NOT DETECTED Final   Staphylococcus aureus NOT DETECTED NOT DETECTED Final   Streptococcus species NOT DETECTED NOT DETECTED Final    Streptococcus agalactiae NOT DETECTED NOT DETECTED Final   Streptococcus pneumoniae NOT DETECTED NOT DETECTED Final   Streptococcus pyogenes NOT DETECTED NOT DETECTED Final   Acinetobacter baumannii NOT DETECTED NOT DETECTED Final   Enterobacteriaceae species DETECTED (A) NOT DETECTED Final    Comment: Enterobacteriaceae represent a large family of gram-negative  bacteria, not a single organism. CRITICAL RESULT CALLED TO, READ BACK BY AND VERIFIED WITH: B. [employer].D. 14:30 06/22/16 ([email or web address])    Enterobacter cloacae complex NOT DETECTED NOT DETECTED Final   Escherichia coli DETECTED (A) NOT DETECTED Final    Comment: CRITICAL RESULT CALLED TO, READ BACK BY AND VERIFIED WITH: B. [employer].D. 14:30 06/22/16 ([email or web address])    Klebsiella oxytoca NOT DETECTED NOT DETECTED Final   Klebsiella pneumoniae NOT DETECTED NOT DETECTED Final   Proteus species NOT DETECTED NOT DETECTED Final   Serratia marcescens NOT DETECTED NOT DETECTED Final   Carbapenem resistance NOT DETECTED NOT DETECTED Final   Haemophilus influenzae NOT DETECTED NOT DETECTED Final   Neisseria meningitidis NOT DETECTED NOT DETECTED Final   Pseudomonas aeruginosa NOT DETECTED NOT DETECTED Final   Candida albicans NOT DETECTED NOT DETECTED Final   Candida glabrata NOT DETECTED NOT DETECTED Final   Candida krusei NOT DETECTED NOT DETECTED Final   Candida parapsilosis NOT DETECTED NOT DETECTED Final   Candida tropicalis NOT DETECTED NOT DETECTED Final    Comment: Performed at [facility], [place] [address] [place], [place] [address]  Blood Culture (routine x 2)     Status: None (Preliminary result)   Collection Time: 06/20/16 10:33 PM  Result Value Ref Range Status   Specimen Description BLOOD RIGHT ARM  Final   Special Requests BOTTLES DRAWN AEROBIC AND ANAEROBIC 6CC EACH  Final   Culture NO GROWTH 4 DAYS  Final   Report Status PENDING  Incomplete  Urine culture     Status: Abnormal   Collection Time: 06/20/16 11:54 PM   Result Value Ref Range Status   Specimen Description URINE, CATHETERIZED  Final   Special Requests NONE  Final   Culture >=100,000 COLONIES/mL ESCHERICHIA COLI (A)  Final   Report Status 06/23/2016 FINAL  Final   Organism ID, Bacteria ESCHERICHIA COLI (A)  Final      Susceptibility   Escherichia coli - MIC*    AMPICILLIN >=32 RESISTANT Resistant     CEFAZOLIN <=4 SENSITIVE Sensitive     CEFTRIAXONE <=1 SENSITIVE Sensitive     CIPROFLOXACIN >=4 RESISTANT Resistant     GENTAMICIN <=1 SENSITIVE Sensitive     IMIPENEM <=0.25 SENSITIVE Sensitive     NITROFURANTOIN <=16 SENSITIVE Sensitive     TRIMETH/SULFA <=20 SENSITIVE Sensitive     AMPICILLIN/SULBACTAM 16 INTERMEDIATE Intermediate     PIP/TAZO <=4 SENSITIVE Sensitive     Extended ESBL NEGATIVE Sensitive     * >=100,000 COLONIES/mL ESCHERICHIA COLI     Studies: US Renal  Result Date: 06/23/2016 CLINICAL DATA:  Gross hematuria and urinary tract infections EXAM: RENAL / URINARY TRACT ULTRASOUND COMPLETE COMPARISON:  None. FINDINGS: Right Kidney: Length: 13.6 cm. Echogenicity and renal cortical thickness are within normal limits. No perinephric fluid or hydronephrosis visualized. There is a cyst arising from the upper pole the right kidney measuring 5.3 x 3.9 x 4.3 cm. A cyst arising from the lower pole of the right kidney measures 2.3 x 2.6 x 2.7 cm. No sonographically demonstrable calculus or ureterectasis. Left Kidney: Length: 13.2 cm. Echogenicity and renal cortical thickness are within normal limits. No perinephric fluid or hydronephrosis visualized. There is a cyst arising from the upper pole of the left kidney measuring 2.6 x 2.4 x 3.7 cm. There is a nearby cyst measuring 1.9 x 1.7 x 1.7 cm. No sonographically demonstrable calculus or ureterectasis. Bladder: There is irregular debris noted in the urinary bladder. Urinary bladder wall  appears mildly thickened. Flow from the distal ureters is seen in the bladder. The patient was unable to  void. IMPRESSION: Complex debris noted within the urinary bladder with mild urinary bladder wall thickening. Suspect cystitis. Note that patient was unable to void; a finding concerning for bladder outlet obstruction. Etiology for bladder outlet obstruction uncertain. Cysts noted in each kidney.  No obstructing focus in either kidney. Electronically Signed   By: Lowella Grip III M.D.   On: 06/23/2016 11:18   Scheduled Meds: . amLODipine  5 mg Oral Daily  . atorvastatin  10 mg Oral QPM  . cefTRIAXone (ROCEPHIN)  IV  2 g Intravenous Q24H  . digoxin  0.125 mg Oral Daily  . insulin aspart  0-9 Units Subcutaneous TID WC  . insulin aspart  8 Units Subcutaneous TID WC  . insulin glargine  12 Units Subcutaneous QHS  . lisinopril  40 mg Oral QPM  . LORazepam  2 mg Oral QHS  . Warfarin - Pharmacist Dosing Inpatient   Does not apply Q24H   Continuous Infusions:  Principal Problem:   Sepsis ([place]) Active Problems:   CORONARY ATHEROSCLEROSIS NATIVE CORONARY ARTERY   Atrial fibrillation (HCC)   Long term current use of anticoagulant therapy   Type 2 diabetes mellitus (HCC)   Chronic lymphocytic leukemia (HCC)   Hypoglycemia   Acute metabolic encephalopathy Time spent: 3mins  Emmah Bratcher, MD, [facility] Pager [phone]  If 7PM-7AM, please contact night-coverage [email or web address] Password [email or web address] 06/24/2016, 7:09 PM    LOS: 3 days

## 2016-06-25 LAB — GLUCOSE, CAPILLARY
Glucose-Capillary: 193 mg/dL — ABNORMAL HIGH (ref 65–99)
Glucose-Capillary: 226 mg/dL — ABNORMAL HIGH (ref 65–99)
Glucose-Capillary: 329 mg/dL — ABNORMAL HIGH (ref 65–99)
Glucose-Capillary: 293 mg/dL — ABNORMAL HIGH (ref 65–99)

## 2016-06-25 LAB — PROTIME-INR
INR: 1.8
PROTHROMBIN TIME: 21.1 s — AB (ref 11.4–15.2)

## 2016-06-25 MED ORDER — GUAIFENESIN-DM 100-10 MG/5ML PO SYRP
5.0000 mL | ORAL_SOLUTION | ORAL | Status: DC | PRN
  Administered 2016-06-25: 5 mL via ORAL
  Filled 2016-06-25: qty 5

## 2016-06-25 MED ORDER — WARFARIN SODIUM 7.5 MG PO TABS
7.5000 mg | ORAL_TABLET | Freq: Once | ORAL | Status: AC
  Administered 2016-06-25: 7.5 mg via ORAL
  Filled 2016-06-25: qty 1

## 2016-06-25 MED ORDER — CEFDINIR 300 MG PO CAPS: 300.0000 mg | ORAL_CAPSULE | Freq: Two times a day (BID) | ORAL | 0 refills | Status: DC

## 2016-06-25 NOTE — Evaluation (Signed)
Physical Therapy Evaluation Patient Details Name: [name] [name] MRN: [id number] DOB: 04-Nov-1934 Today's Date: 06/25/2016   History of Present Illness  82 y.o. male with medical history significant of afib, DM, CAD, CLL lives at home with wife comes in tonight after not being able to get up and stand up.  He reports he went to pee and it was bloody.  Family noted he was very confused also.  In the ED on arrival his temp was over 101.  He denies any cough or respiratory symptoms.  Denies any sob, denies any dysuria or change in urinary stream.  No rashes.  He did vomit earlier once, no diarrhea.  No abdominal pain or chest pain.  Pt sugar was also in the 60s.  He was given some ivf, glucose in the ED and his confusion has resolved.  Pt referred for admission for likely sepsis.  Gram negative Sepsis due to E coli  -Blood culture and urine culture growing e coli,  Pt is afebrile and hemodynamics have improved.  Because of urinary source and penile bleeding on presentation, urology consulted, and ordered renal ultrasound which showed bladder debris and wall thickening.  Clinical Impression  Pt received sitting up in the chair, wife present.  Pt states that he has only been using the RW for the past few days prior to admission due to increased pain in his knees.  Wife reports that he had been doing a lot of kneeling while working recently.  During PT evaluation he demonstrates all functional mobility at Modified independent level with use of a RW.  Educated pt on need for a new RW that fit his height requirement.  Recommend that he follow up with OPPT upon d/c.      Follow Up Recommendations Outpatient PT    Equipment Recommendations  Rolling walker with 5" wheels    Recommendations for Other Services       Precautions / Restrictions Precautions Precautions: None Restrictions Weight Bearing Restrictions: No      Mobility  Bed Mobility                  Transfers Overall transfer level:  Needs assistance Equipment used: Rolling walker (2 wheeled) Transfers: Sit to/from Stand Sit to Stand: Modified independent (Device/Increase time)            Ambulation/Gait Ambulation/Gait assistance: Modified independent (Device/Increase time) Ambulation Distance (Feet): 400 Feet Assistive device: Rolling walker (2 wheeled) Gait Pattern/deviations: Step-through pattern;Wide base of support;Antalgic;Trunk flexed     General Gait Details: Pt demonstrates increased forward flexion, which he states is due to previous back issues.  He was able to ambulate for a short period of time withouth the RW, however he was close to the wall.  Pt has his walker from home in the room.  It is a Comptroller.  Pt educated on need for RW to fit his height, and recommend a new one for d/c.   Stairs            Wheelchair Mobility    Modified Rankin (Stroke Patients Only)       Balance Overall balance assessment: Needs assistance Sitting-balance support: No upper extremity supported;Feet supported Sitting balance-Leahy Scale: Good     Standing balance support: Bilateral upper extremity supported Standing balance-Leahy Scale: Fair                               Pertinent Vitals/Pain Pain  Assessment: No/denies pain    Home Living   Living Arrangements: Spouse/significant other   Type of Home: House Home Access: Stairs to enter   Entrance Stairs-Number of Steps: 2 no HR.  Home Layout: Two level;Able to live on main level with bedroom/bathroom Home Equipment: Gilford Rile - 2 wheels;Cane - single point;Bedside commode;Shower seat      Prior Function     Gait / Transfers Assistance Needed: Using RW for a few days prior to admission due to pain in knees which he has intermittently.    ADL's / Homemaking Assistance Needed: independent        Hand Dominance   Dominant Hand: Right    Extremity/Trunk Assessment   Upper Extremity Assessment Upper Extremity Assessment:  Overall WFL for tasks assessed    Lower Extremity Assessment Lower Extremity Assessment: Generalized weakness    Cervical / Trunk Assessment Cervical / Trunk Assessment: Kyphotic  Communication   Communication: HOH  Cognition Arousal/Alertness: Awake/alert Behavior During Therapy: WFL for tasks assessed/performed Overall Cognitive Status: Within Functional Limits for tasks assessed                      General Comments      Exercises     Assessment/Plan    PT Assessment All further PT needs can be met in the next venue of care  PT Problem List Decreased strength;Decreased activity tolerance;Decreased balance;Decreased mobility;Obesity       PT Treatment Interventions DME instruction;Gait training;Functional mobility training;Therapeutic activities;Therapeutic exercise;Balance training    PT Goals (Current goals can be found in the Care Plan section)  Acute Rehab PT Goals Patient Stated Goal: To go home and figure out what is wrong with his knee.  PT Goal Formulation: All assessment and education complete, DC therapy    Frequency     Barriers to discharge        Co-evaluation               End of Session Equipment Utilized During Treatment: Gait belt Activity Tolerance: Patient tolerated treatment well Patient left: in chair;with call bell/phone within reach;with family/visitor present Nurse Communication: Mobility status (mobility sheet left haning in the room. ) PT Visit Diagnosis: Muscle weakness (generalized) (M62.81);Other abnormalities of gait and mobility (R26.89)    Functional Assessment Tool Used: AM-PAC 6 Clicks Basic Mobility;Clinical judgement Functional Limitation: Mobility: Walking and moving around Mobility: Walking and Moving Around Current Status (Q3300): At least 20 percent but less than 40 percent impaired, limited or restricted Mobility: Walking and Moving Around Goal Status [phone]): At least 20 percent but less than 40 percent  impaired, limited or restricted Mobility: Walking and Moving Around Discharge Status [phone]): At least 20 percent but less than 40 percent impaired, limited or restricted    Time: 0956-1021 PT Time Calculation (min) (ACUTE ONLY): 25 min   Charges:   PT Evaluation $PT Eval Low Complexity: 1 Procedure PT Treatments $Gait Training: 8-22 mins   PT G Codes:   PT G-Codes **NOT FOR INPATIENT CLASS** Functional Assessment Tool Used: AM-PAC 6 Clicks Basic Mobility;Clinical judgement Functional Limitation: Mobility: Walking and moving around Mobility: Walking and Moving Around Current Status (T6256): At least 20 percent but less than 40 percent impaired, limited or restricted Mobility: Walking and Moving Around Goal Status [phone]): At least 20 percent but less than 40 percent impaired, limited or restricted Mobility: Walking and Moving Around Discharge Status [phone]): At least 20 percent but less than 40 percent impaired, limited or restricted  Beth Darlina Mccaughey, PT, DPT X: [phone]

## 2016-06-25 NOTE — Discharge Summary (Signed)
Physician Discharge Summary  THELMER LEGLER EXB:[id number] DOB: 17-Jun-1934 DOA: 06/20/2016  PCP: [name], MD  Admit date: 06/20/2016 Discharge date: 06/25/2016  Admitted From: home Disposition: home  Recommendations for Outpatient Follow-up:  1. Follow up with PCP in 1-2 weeks 2. Please obtain BMP/CBC in one week  Home Health: Equipment/Devices:  Discharge Condition: stable CODE STATUS: full code Diet recommendation: Heart Healthy / Carb Modified   Brief/Interim Summary: 82 year old male with a history of diabetes, CLL, coronary artery disease, was brought to the hospital with fever, inability to stand and hematuria. Found to have UTI due to Escherichia coli with associated bacteremia. He was treated with intravenous Rocephin and has since significantly improved. Neurology was consulted who ordered renal ultrasound did not show any significant abnormalities. He did not have any significant postvoid residual volume. The patient is adequately emptying his bladder. He has been transitioned to oral Omnicef to complete his antibiotic course. His lab work is otherwise unremarkable and vitals stable. He was seen by physical therapy who did not recommend any home health. He is continued on his Coumadin for atrial fibrillation. Patient is otherwise stable for discharge home.  Discharge Diagnoses:  Principal Problem:   Sepsis ([place]) Active Problems:   CORONARY ATHEROSCLEROSIS NATIVE CORONARY ARTERY   Atrial fibrillation ([place])   Long term current use of anticoagulant therapy   Type 2 diabetes mellitus (HCC)   Chronic lymphocytic leukemia (HCC)   Hypoglycemia   Acute metabolic encephalopathy    Discharge Instructions  Discharge Instructions    Diet - low sodium heart healthy    Complete by:  As directed    Increase activity slowly    Complete by:  As directed      Allergies as of 06/25/2016      Reactions   Penicillins Hives, Other (See Comments)   Has patient had a PCN reaction  causing immediate rash, facial/tongue/throat swelling, SOB or lightheadedness with hypotension: No Has patient had a PCN reaction causing severe rash involving mucus membranes or skin necrosis: No Has patient had a PCN reaction that required hospitalization No Has patient had a PCN reaction occurring within the last 10 years: No If all of the above answers are "NO", then may proceed with Cephalosporin use.      Medication List    TAKE these medications   acetaminophen 650 MG CR tablet Commonly known as:  TYLENOL Take 650 mg by mouth every 8 (eight) hours as needed for pain.   amLODipine 5 MG tablet Commonly known as:  NORVASC Take 5 mg by mouth daily.   atorvastatin 10 MG tablet Commonly known as:  LIPITOR Take 10 mg by mouth every evening.   CALCIUM 600+D 600-400 MG-UNIT tablet Generic drug:  Calcium Carbonate-Vitamin D Take 1 tablet by mouth daily.   cefdinir 300 MG capsule Commonly known as:  OMNICEF Take 1 capsule (300 mg total) by mouth 2 (two) times daily.   cholecalciferol 1000 units tablet Commonly known as:  VITAMIN D Take 1,000 Units by mouth daily.   ciclopirox 0.77 % cream Commonly known as:  LOPROX Apply 1 application topically daily as needed (for rash on foot).   Co Q-10 100 MG Caps Take 200 mg by mouth daily.   digoxin 0.125 MG tablet Commonly known as:  LANOXIN Take 0.125 mg by mouth daily.   Fish Oil 1200 MG Caps Take 1,200 mg by mouth 2 (two) times daily.   glimepiride 4 MG tablet Commonly known as:  AMARYL Take 4 mg  by mouth 2 (two) times daily.   hydrochlorothiazide 25 MG tablet Commonly known as:  HYDRODIURIL Take 25 mg by mouth daily.   LEVEMIR FLEXPEN 100 UNIT/ML injection Generic drug:  insulin detemir Inject 50 Units into the skin daily.   lisinopril 40 MG tablet Commonly known as:  PRINIVIL,ZESTRIL Take 40 mg by mouth every evening.   LORazepam 1 MG tablet Commonly known as:  ATIVAN Take 2 mg by mouth at bedtime.    multivitamin with minerals Tabs tablet Take 1 tablet by mouth daily.   NOVOLOG FLEXPEN 100 UNIT/ML FlexPen Generic drug:  insulin aspart Inject 4-18 Units into the skin 2 (two) times daily before a meal. Pt uses per sliding scale before lunch and dinner.   pioglitazone 30 MG tablet Commonly known as:  ACTOS Take 30 mg by mouth every evening.   VICTOZA 18 MG/3ML Soln injection Generic drug:  Liraglutide Inject 1.8 mg into the skin daily.   vitamin C 1000 MG tablet Take 1,000 mg by mouth daily.   vitamin E 400 UNIT capsule Take 400 Units by mouth daily.   warfarin 5 MG tablet Commonly known as:  COUMADIN Take 5 mg by mouth every evening.            Durable Medical Equipment        Start     Ordered   06/25/16 1025  For home use only DME Walker rolling  Once    Question:  Patient needs a walker to treat with the following condition  Answer:  Knee joint replacement status, unspecified laterality   06/25/16 1029      Allergies  Allergen Reactions  . Penicillins Hives and Other (See Comments)    Has patient had a PCN reaction causing immediate rash, facial/tongue/throat swelling, SOB or lightheadedness with hypotension: No Has patient had a PCN reaction causing severe rash involving mucus membranes or skin necrosis: No Has patient had a PCN reaction that required hospitalization No Has patient had a PCN reaction occurring within the last 10 years: No If all of the above answers are "NO", then may proceed with Cephalosporin use.     Consultations:  urology   Procedures/Studies: US Renal  Result Date: 06/23/2016 CLINICAL DATA:  Gross hematuria and urinary tract infections EXAM: RENAL / URINARY TRACT ULTRASOUND COMPLETE COMPARISON:  None. FINDINGS: Right Kidney: Length: 13.6 cm. Echogenicity and renal cortical thickness are within normal limits. No perinephric fluid or hydronephrosis visualized. There is a cyst arising from the upper pole the right kidney  measuring 5.3 x 3.9 x 4.3 cm. A cyst arising from the lower pole of the right kidney measures 2.3 x 2.6 x 2.7 cm. No sonographically demonstrable calculus or ureterectasis. Left Kidney: Length: 13.2 cm. Echogenicity and renal cortical thickness are within normal limits. No perinephric fluid or hydronephrosis visualized. There is a cyst arising from the upper pole of the left kidney measuring 2.6 x 2.4 x 3.7 cm. There is a nearby cyst measuring 1.9 x 1.7 x 1.7 cm. No sonographically demonstrable calculus or ureterectasis. Bladder: There is irregular debris noted in the urinary bladder. Urinary bladder wall appears mildly thickened. Flow from the distal ureters is seen in the bladder. The patient was unable to void. IMPRESSION: Complex debris noted within the urinary bladder with mild urinary bladder wall thickening. Suspect cystitis. Note that patient was unable to void; a finding concerning for bladder outlet obstruction. Etiology for bladder outlet obstruction uncertain. Cysts noted in each kidney.  No obstructing focus  in either kidney. Electronically Signed   By: Lowella Grip III M.D.   On: 06/23/2016 11:18   Dg Chest Port 1 View  Result Date: 06/20/2016 CLINICAL DATA:  82 year old male with history of fever. Bleeding from the penis. EXAM: PORTABLE CHEST 1 VIEW COMPARISON:  Chest x-ray 08/23/2006. FINDINGS: There is cephalization of the pulmonary vasculature and slight indistinctness of the interstitial markings suggestive of mild pulmonary edema. A a small left pleural effusion. Mild cardiomegaly. Upper mediastinal contours are within normal limits. Aortic atherosclerosis. IMPRESSION: 1. The appearance the chest is most suggestive of mild congestive heart failure. 2. Aortic atherosclerosis. Electronically Signed   By: Vinnie Langton M.D.   On: 06/20/2016 23:24       Subjective: No complaints  Discharge Exam: Vitals:   06/24/16 2126 06/25/16 0541  BP: 133/78 129/62  Pulse: 73 86  Resp: 18  18  Temp: 98.9 F (37.2 C) 99.5 F (37.5 C)   Vitals:   06/24/16 0700 06/24/16 1500 06/24/16 2126 06/25/16 0541  BP:  (!) 138/50 133/78 129/62  Pulse:  70 73 86  Resp:  18 18 18   Temp:  98.4 F (36.9 C) 98.9 F (37.2 C) 99.5 F (37.5 C)  TempSrc:  Oral Oral Oral  SpO2:  98% 100% 96%  Weight: 131 kg (288 lb 12.8 oz)   133 kg (293 lb 4.8 oz)  Height:        General: Pt is alert, awake, not in acute distress Cardiovascular: RRR, S1/S2 +, no rubs, no gallops Respiratory: CTA bilaterally, no wheezing, no rhonchi Abdominal: Soft, NT, ND, bowel sounds + Extremities: no edema, no cyanosis    The results of significant diagnostics from this hospitalization (including imaging, microbiology, ancillary and laboratory) are listed below for reference.     Microbiology: Recent Results (from the past 240 hour(s))  Blood Culture (routine x 2)     Status: Abnormal   Collection Time: 06/20/16 10:19 PM  Result Value Ref Range Status   Specimen Description RIGHT ANTECUBITAL  Final   Special Requests BOTTLES DRAWN AEROBIC AND ANAEROBIC [place] EACH  Final   Culture  Setup Time   Final    Gram Stain Report Called to,Read Back By and Verified With: HAWKINS M. AT 0915 ON [id number] BY THOMPSON S. Performed at [facility], [place] [address], [place], [place] [address]    Culture ESCHERICHIA COLI (A)  Final   Report Status 06/24/2016 FINAL  Final   Organism ID, Bacteria ESCHERICHIA COLI  Final      Susceptibility   Escherichia coli - MIC*    AMPICILLIN >=32 RESISTANT Resistant     CEFAZOLIN <=4 SENSITIVE Sensitive     CEFEPIME <=1 SENSITIVE Sensitive     CEFTAZIDIME <=1 SENSITIVE Sensitive     CEFTRIAXONE <=1 SENSITIVE Sensitive     CIPROFLOXACIN >=4 RESISTANT Resistant     GENTAMICIN <=1 SENSITIVE Sensitive     IMIPENEM <=0.25 SENSITIVE Sensitive     TRIMETH/SULFA <=20 SENSITIVE Sensitive     AMPICILLIN/SULBACTAM 16 INTERMEDIATE Intermediate     PIP/TAZO <=4 SENSITIVE Sensitive      Extended ESBL NEGATIVE Sensitive     * ESCHERICHIA COLI  Blood Culture ID Panel (Reflexed)     Status: Abnormal   Collection Time: 06/20/16 10:19 PM  Result Value Ref Range Status   Enterococcus species NOT DETECTED NOT DETECTED Final   Listeria monocytogenes NOT DETECTED NOT DETECTED Final   Staphylococcus species NOT DETECTED NOT DETECTED Final  Staphylococcus aureus NOT DETECTED NOT DETECTED Final   Streptococcus species NOT DETECTED NOT DETECTED Final   Streptococcus agalactiae NOT DETECTED NOT DETECTED Final   Streptococcus pneumoniae NOT DETECTED NOT DETECTED Final   Streptococcus pyogenes NOT DETECTED NOT DETECTED Final   Acinetobacter baumannii NOT DETECTED NOT DETECTED Final   Enterobacteriaceae species DETECTED (A) NOT DETECTED Final    Comment: Enterobacteriaceae represent a large family of gram-negative bacteria, not a single organism. CRITICAL RESULT CALLED TO, READ BACK BY AND VERIFIED WITH: B. [employer].D. 14:30 06/22/16 ([email or web address])    Enterobacter cloacae complex NOT DETECTED NOT DETECTED Final   Escherichia coli DETECTED (A) NOT DETECTED Final    Comment: CRITICAL RESULT CALLED TO, READ BACK BY AND VERIFIED WITH: B. [employer].D. 14:30 06/22/16 ([email or web address])    Klebsiella oxytoca NOT DETECTED NOT DETECTED Final   Klebsiella pneumoniae NOT DETECTED NOT DETECTED Final   Proteus species NOT DETECTED NOT DETECTED Final   Serratia marcescens NOT DETECTED NOT DETECTED Final   Carbapenem resistance NOT DETECTED NOT DETECTED Final   Haemophilus influenzae NOT DETECTED NOT DETECTED Final   Neisseria meningitidis NOT DETECTED NOT DETECTED Final   Pseudomonas aeruginosa NOT DETECTED NOT DETECTED Final   Candida albicans NOT DETECTED NOT DETECTED Final   Candida glabrata NOT DETECTED NOT DETECTED Final   Candida krusei NOT DETECTED NOT DETECTED Final   Candida parapsilosis NOT DETECTED NOT DETECTED Final   Candida tropicalis NOT DETECTED NOT DETECTED Final    Comment:  Performed at [facility], [place] [address], [place], [place] [address]  Blood Culture (routine x 2)     Status: None (Preliminary result)   Collection Time: 06/20/16 10:33 PM  Result Value Ref Range Status   Specimen Description BLOOD RIGHT ARM  Final   Special Requests BOTTLES DRAWN AEROBIC AND ANAEROBIC 6CC EACH  Final   Culture NO GROWTH 4 DAYS  Final   Report Status PENDING  Incomplete  Urine culture     Status: Abnormal   Collection Time: 06/20/16 11:54 PM  Result Value Ref Range Status   Specimen Description URINE, CATHETERIZED  Final   Special Requests NONE  Final   Culture >=100,000 COLONIES/mL ESCHERICHIA COLI (A)  Final   Report Status 06/23/2016 FINAL  Final   Organism ID, Bacteria ESCHERICHIA COLI (A)  Final      Susceptibility   Escherichia coli - MIC*    AMPICILLIN >=32 RESISTANT Resistant     CEFAZOLIN <=4 SENSITIVE Sensitive     CEFTRIAXONE <=1 SENSITIVE Sensitive     CIPROFLOXACIN >=4 RESISTANT Resistant     GENTAMICIN <=1 SENSITIVE Sensitive     IMIPENEM <=0.25 SENSITIVE Sensitive     NITROFURANTOIN <=16 SENSITIVE Sensitive     TRIMETH/SULFA <=20 SENSITIVE Sensitive     AMPICILLIN/SULBACTAM 16 INTERMEDIATE Intermediate     PIP/TAZO <=4 SENSITIVE Sensitive     Extended ESBL NEGATIVE Sensitive     * >=100,000 COLONIES/mL ESCHERICHIA COLI     Labs: BNP (last 3 results) No results for input(s): BNP in the last 8760 hours. Basic Metabolic Panel:  Recent Labs Lab 06/20/16 2219 06/21/16 0425 06/22/16 0443 06/23/16 0452 06/24/16 0549  NA 137 134* 136 137 135  K 3.3* 3.4* 3.1* 3.6 3.5  CL 100* 102 107 108 106  CO2 27 22 22 22 23   GLUCOSE 62* 137* 172* 178* 201*  BUN 26* 26* 38* 30* 22*  CREATININE 1.09 1.15 1.03 0.80 0.68  CALCIUM 10.0 8.6* 8.6* 8.5* 8.3*  MG  --   --  1.4* 1.6*  --    Liver Function Tests:  Recent Labs Lab 06/20/16 2219  AST 41  ALT 21  ALKPHOS 122  BILITOT 2.3*  PROT 6.4*  ALBUMIN 3.7   No results for input(s): LIPASE,  AMYLASE in the last 168 hours. No results for input(s): AMMONIA in the last 168 hours. CBC:  Recent Labs Lab 06/20/16 2219 06/21/16 0425 06/22/16 0443 06/23/16 0452 06/24/16 0549  WBC 12.2* 21.8* 22.7* 15.2* 14.1*  NEUTROABS 8.3*  --   --   --   --   HGB 14.9 13.1 12.4* 12.7* 12.8*  HCT 43.8 39.3 35.9* 37.4* 37.5*  MCV 88.8 90.1 87.8 87.8 88.0  PLT 141* 122* 110* 112* 113*   Cardiac Enzymes: No results for input(s): CKTOTAL, CKMB, CKMBINDEX, TROPONINI in the last 168 hours. BNP: Invalid input(s): POCBNP CBG:  Recent Labs Lab 06/24/16 1624 06/24/16 2125 06/25/16 0329 06/25/16 0738 06/25/16 1114  GLUCAP 248* 246* 193* 226* 329*   D-Dimer No results for input(s): DDIMER in the last 72 hours. Hgb A1c No results for input(s): HGBA1C in the last 72 hours. Lipid Profile No results for input(s): CHOL, HDL, LDLCALC, TRIG, CHOLHDL, LDLDIRECT in the last 72 hours. Thyroid function studies No results for input(s): TSH, T4TOTAL, T3FREE, THYROIDAB in the last 72 hours.  Invalid input(s): FREET3 Anemia work up No results for input(s): VITAMINB12, FOLATE, FERRITIN, TIBC, IRON, RETICCTPCT in the last 72 hours. Urinalysis    Component Value Date/Time   COLORURINE AMBER (A) 06/20/2016 2354   APPEARANCEUR HAZY (A) 06/20/2016 2354   LABSPEC 1.010 06/20/2016 2354   PHURINE 5.0 06/20/2016 2354   GLUCOSEU NEGATIVE 06/20/2016 2354   HGBUR SMALL (A) 06/20/2016 2354   BILIRUBINUR NEGATIVE 06/20/2016 2354   KETONESUR NEGATIVE 06/20/2016 2354   PROTEINUR NEGATIVE 06/20/2016 2354   NITRITE NEGATIVE 06/20/2016 2354   LEUKOCYTESUR NEGATIVE 06/20/2016 2354   Sepsis Labs Invalid input(s): PROCALCITONIN,  WBC,  LACTICIDVEN Microbiology Recent Results (from the past 240 hour(s))  Blood Culture (routine x 2)     Status: Abnormal   Collection Time: 06/20/16 10:19 PM  Result Value Ref Range Status   Specimen Description RIGHT ANTECUBITAL  Final   Special Requests BOTTLES DRAWN AEROBIC  AND ANAEROBIC [facility] EACH  Final   Culture  Setup Time   Final    Gram Stain Report Called to,Read Back By and Verified With: HAWKINS M. AT 0915 ON [id number] BY THOMPSON S. Performed at [facility], [place] [address], [place], [place] [address]    Culture ESCHERICHIA COLI (A)  Final   Report Status 06/24/2016 FINAL  Final   Organism ID, Bacteria ESCHERICHIA COLI  Final      Susceptibility   Escherichia coli - MIC*    AMPICILLIN >=32 RESISTANT Resistant     CEFAZOLIN <=4 SENSITIVE Sensitive     CEFEPIME <=1 SENSITIVE Sensitive     CEFTAZIDIME <=1 SENSITIVE Sensitive     CEFTRIAXONE <=1 SENSITIVE Sensitive     CIPROFLOXACIN >=4 RESISTANT Resistant     GENTAMICIN <=1 SENSITIVE Sensitive     IMIPENEM <=0.25 SENSITIVE Sensitive     TRIMETH/SULFA <=20 SENSITIVE Sensitive     AMPICILLIN/SULBACTAM 16 INTERMEDIATE Intermediate     PIP/TAZO <=4 SENSITIVE Sensitive     Extended ESBL NEGATIVE Sensitive     * ESCHERICHIA COLI  Blood Culture ID Panel (Reflexed)     Status: Abnormal   Collection Time: 06/20/16 10:19 PM  Result  Value Ref Range Status   Enterococcus species NOT DETECTED NOT DETECTED Final   Listeria monocytogenes NOT DETECTED NOT DETECTED Final   Staphylococcus species NOT DETECTED NOT DETECTED Final   Staphylococcus aureus NOT DETECTED NOT DETECTED Final   Streptococcus species NOT DETECTED NOT DETECTED Final   Streptococcus agalactiae NOT DETECTED NOT DETECTED Final   Streptococcus pneumoniae NOT DETECTED NOT DETECTED Final   Streptococcus pyogenes NOT DETECTED NOT DETECTED Final   Acinetobacter baumannii NOT DETECTED NOT DETECTED Final   Enterobacteriaceae species DETECTED (A) NOT DETECTED Final    Comment: Enterobacteriaceae represent a large family of gram-negative bacteria, not a single organism. CRITICAL RESULT CALLED TO, READ BACK BY AND VERIFIED WITH: B. [employer].D. 14:30 06/22/16 ([email or web address])    Enterobacter cloacae complex NOT DETECTED NOT DETECTED Final    Escherichia coli DETECTED (A) NOT DETECTED Final    Comment: CRITICAL RESULT CALLED TO, READ BACK BY AND VERIFIED WITH: B. [employer].D. 14:30 06/22/16 ([email or web address])    Klebsiella oxytoca NOT DETECTED NOT DETECTED Final   Klebsiella pneumoniae NOT DETECTED NOT DETECTED Final   Proteus species NOT DETECTED NOT DETECTED Final   Serratia marcescens NOT DETECTED NOT DETECTED Final   Carbapenem resistance NOT DETECTED NOT DETECTED Final   Haemophilus influenzae NOT DETECTED NOT DETECTED Final   Neisseria meningitidis NOT DETECTED NOT DETECTED Final   Pseudomonas aeruginosa NOT DETECTED NOT DETECTED Final   Candida albicans NOT DETECTED NOT DETECTED Final   Candida glabrata NOT DETECTED NOT DETECTED Final   Candida krusei NOT DETECTED NOT DETECTED Final   Candida parapsilosis NOT DETECTED NOT DETECTED Final   Candida tropicalis NOT DETECTED NOT DETECTED Final    Comment: Performed at [facility], [place] [address] [place], [place] [address]  Blood Culture (routine x 2)     Status: None (Preliminary result)   Collection Time: 06/20/16 10:33 PM  Result Value Ref Range Status   Specimen Description BLOOD RIGHT ARM  Final   Special Requests BOTTLES DRAWN AEROBIC AND ANAEROBIC 6CC EACH  Final   Culture NO GROWTH 4 DAYS  Final   Report Status PENDING  Incomplete  Urine culture     Status: Abnormal   Collection Time: 06/20/16 11:54 PM  Result Value Ref Range Status   Specimen Description URINE, CATHETERIZED  Final   Special Requests NONE  Final   Culture >=100,000 COLONIES/mL ESCHERICHIA COLI (A)  Final   Report Status 06/23/2016 FINAL  Final   Organism ID, Bacteria ESCHERICHIA COLI (A)  Final      Susceptibility   Escherichia coli - MIC*    AMPICILLIN >=32 RESISTANT Resistant     CEFAZOLIN <=4 SENSITIVE Sensitive     CEFTRIAXONE <=1 SENSITIVE Sensitive     CIPROFLOXACIN >=4 RESISTANT Resistant     GENTAMICIN <=1 SENSITIVE Sensitive     IMIPENEM <=0.25 SENSITIVE Sensitive      NITROFURANTOIN <=16 SENSITIVE Sensitive     TRIMETH/SULFA <=20 SENSITIVE Sensitive     AMPICILLIN/SULBACTAM 16 INTERMEDIATE Intermediate     PIP/TAZO <=4 SENSITIVE Sensitive     Extended ESBL NEGATIVE Sensitive     * >=100,000 COLONIES/mL ESCHERICHIA COLI     Time coordinating discharge: Over 30 minutes  SIGNED:   Kathie Dike, MD  [facility] 06/25/2016, 2:54 PM Pager   If 7PM-7AM, please contact night-coverage [email or web address] Password [email or web address]

## 2016-06-25 NOTE — Progress Notes (Signed)
Results for ESLI, JERNIGAN (MRN [id number]) as of 06/25/2016 14:05  Ref. Range 06/24/2016 16:24 06/24/2016 21:25 06/25/2016 03:29 06/25/2016 07:38 06/25/2016 11:14  Glucose-Capillary Latest Ref Range: 65 - 99 mg/dL 248 (H) 246 (H) 193 (H) 226 (H) 329 (H)  Noted that blood sugars continue to be greater than 180 mg/dl.  Recommend increasing Lantus to 15-20 units daily if blood sugars continue to be elevated. Will continue to monitor blood sugars while in the hospital. Harvel Ricks RN BSN CDE

## 2016-06-25 NOTE — Progress Notes (Signed)
Pt voided 450 ml clear yellow urine in urinal. Post void residual 16 ml on bladder scan.

## 2016-06-25 NOTE — Care Management Note (Addendum)
Case Management Note  Patient Details  Name: NIKOLAJ GERAGHTY MRN: [id number] Date of Birth: 1934/05/16   Expected Discharge Date:  06/25/16               Expected Discharge Plan:  Home/Self Care  In-House Referral:  NA  Discharge planning Services  CM Consult  Post Acute Care Choice:  Durable Medical Equipment Choice offered to:  Patient  DME Arranged:  Gilford Rile rolling DME Agency:  [place].  Status of Service:  Completed, signed off  Additional Comments: Pt discharging home today with self care. Pt needs new walker and has chosen [facility] from list of DME providers. Jermaine, [facility] rep, aware of referral and will obtain pt info from chart and deliver DME to pt room prior to discharge. PT has recommended OP PT and pt wishes to wait until his ortho appointment coming up in the next few weeks. No other needs communicated.  Sherald Barge, RN 06/25/2016, 4:16 PM

## 2016-06-25 NOTE — Care Management Important Message (Signed)
Important Message  Patient Details  Name: [name] [name] MRN: [id number] Date of Birth: 1934-12-23   Medicare Important Message Given:  Yes    Sherald Barge, RN 06/25/2016, 4:21 PM

## 2016-06-25 NOTE — Progress Notes (Signed)
[facility] for Coumadin Indication: atrial fibrillation  Allergies  Allergen Reactions  . Penicillins Hives and Other (See Comments)    Has patient had a PCN reaction causing immediate rash, facial/tongue/throat swelling, SOB or lightheadedness with hypotension: No Has patient had a PCN reaction causing severe rash involving mucus membranes or skin necrosis: No Has patient had a PCN reaction that required hospitalization No Has patient had a PCN reaction occurring within the last 10 years: No If all of the above answers are "NO", then may proceed with Cephalosporin use.    Patient Measurements: Height: 5\' 11"  (180.3 cm) Weight: 293 lb 4.8 oz (133 kg) IBW/kg (Calculated) : 75.3  Vital Signs: Temp: 99.5 F (37.5 C) (03/22 0541) Temp Source: Oral (03/22 0541) BP: 129/62 (03/22 0541) Pulse Rate: 86 (03/22 0541)  Labs:  Recent Labs  06/23/16 0452 06/24/16 0549 06/25/16 0548  HGB 12.7* 12.8*  --   HCT 37.4* 37.5*  --   PLT 112* 113*  --   LABPROT 20.0* 21.9* 21.1*  INR 1.68 1.88 1.80  CREATININE 0.80 0.68  --    Estimated Creatinine Clearance: 104.2 mL/min (by C-G formula based on SCr of 0.68 mg/dL).  Medical History: Past Medical History:  Diagnosis Date  . Atrial fibrillation ([place])   . Chronic lymphocytic leukemia ([place])   . Coronary atherosclerosis of native coronary artery    Nonobstructive  . Essential hypertension, benign   . Hyperlipidemia   . IgA nephropathy    Dr. Lowanda Foster  . Type 2 diabetes mellitus ([place])   . UTI (urinary tract infection) july  10 th   taking  med    Medications:  Prescriptions Prior to Admission  Medication Sig Dispense Refill Last Dose  . acetaminophen (TYLENOL) 650 MG CR tablet Take 650 mg by mouth every 8 (eight) hours as needed for pain.   Past Week at Unknown time  . amLODipine (NORVASC) 5 MG tablet Take 5 mg by mouth daily.    06/20/2016 at Unknown time  . Ascorbic Acid (VITAMIN C) 1000 MG  tablet Take 1,000 mg by mouth daily.    06/20/2016 at Unknown time  . atorvastatin (LIPITOR) 10 MG tablet Take 10 mg by mouth every evening.   06/20/2016 at Unknown time  . Calcium Carbonate-Vitamin D (CALCIUM 600+D) 600-400 MG-UNIT tablet Take 1 tablet by mouth daily.   06/20/2016 at Unknown time  . cholecalciferol (VITAMIN D) 1000 UNITS tablet Take 1,000 Units by mouth daily.    06/20/2016 at Unknown time  . ciclopirox (LOPROX) 0.77 % cream Apply 1 application topically daily as needed (for rash on foot).   Past Week at Unknown time  . Coenzyme Q10 (CO Q-10) 100 MG CAPS Take 200 mg by mouth daily.    06/20/2016 at Unknown time  . digoxin (LANOXIN) 0.125 MG tablet Take 0.125 mg by mouth daily.   06/20/2016 at unsure  . glimepiride (AMARYL) 4 MG tablet Take 4 mg by mouth 2 (two) times daily.   06/20/2016 at Unknown time  . hydrochlorothiazide 25 MG tablet Take 25 mg by mouth daily.    06/20/2016 at Unknown time  . insulin aspart (NOVOLOG FLEXPEN) 100 UNIT/ML FlexPen Inject 4-18 Units into the skin 2 (two) times daily before a meal. Pt uses per sliding scale before lunch and dinner.   06/20/2016 at Unknown time  . insulin detemir (LEVEMIR FLEXPEN) 100 UNIT/ML injection Inject 50 Units into the skin daily.    06/20/2016 at Unknown  time  . Liraglutide (VICTOZA) 18 MG/3ML SOLN Inject 1.8 mg into the skin daily.    06/20/2016 at Unknown time  . lisinopril (PRINIVIL,ZESTRIL) 40 MG tablet Take 40 mg by mouth every evening.    06/20/2016 at Unknown time  . LORazepam (ATIVAN) 1 MG tablet Take 2 mg by mouth at bedtime.    06/19/2016 at Unknown time  . Multiple Vitamin (MULITIVITAMIN WITH MINERALS) TABS Take 1 tablet by mouth daily.    06/20/2016 at Unknown time  . Omega-3 Fatty Acids (FISH OIL) 1200 MG CAPS Take 1,200 mg by mouth 2 (two) times daily.    06/20/2016 at Unknown time  . pioglitazone (ACTOS) 30 MG tablet Take 30 mg by mouth every evening.    06/19/2016 at Unknown time  . vitamin E 400 UNIT capsule Take 400 Units  by mouth daily.    06/20/2016 at Unknown time  . warfarin (COUMADIN) 5 MG tablet Take 5 mg by mouth every evening.   06/20/2016 at 1800   Assessment: Continuation of coumadin PTA for AFIB. INR remains below goal, hematuria and penile bleeding resolved.    Anticoagulation Dose Instructions as of 06/09/2016    Total Sun Mon Tue Wed Thu Fri Sat  New Dose 35 mg 5 mg 5 mg 5 mg 5 mg 5 mg 5 mg 5 mg    (5 mg x 1) (5 mg x 1) (5 mg x 1) (5 mg x 1) (5 mg x 1) (5 mg x 1) (5 mg x 1)   Goal of Therapy:  INR 2-3   Plan:  Coumadin 7.5 mg po x 1 dose today. PT/INR daily.  Pricilla Larsson 06/25/2016,9:16 AM

## 2016-06-26 LAB — CULTURE, BLOOD (ROUTINE X 2): CULTURE: NO GROWTH

## 2016-06-30 ENCOUNTER — Telehealth: Payer: Self-pay | Admitting: Cardiology

## 2016-06-30 DIAGNOSIS — B351 Tinea unguium: Secondary | ICD-10-CM | POA: Diagnosis not present

## 2016-06-30 DIAGNOSIS — L851 Acquired keratosis [keratoderma] palmaris et plantaris: Secondary | ICD-10-CM | POA: Diagnosis not present

## 2016-06-30 DIAGNOSIS — E1142 Type 2 diabetes mellitus with diabetic polyneuropathy: Secondary | ICD-10-CM | POA: Diagnosis not present

## 2016-06-30 NOTE — Telephone Encounter (Signed)
Patient is requesting Lattie Haw call him back

## 2016-06-30 NOTE — Telephone Encounter (Signed)
Spoke with pt.  He was in [facility] 3/17 - 3/23 for UTI with sepsis.  Was d/c charged on Omnicef 300mg  bid.  Will finish 07/05/16. D/C INR was 1.8.   Made pt appt for INR check on 07/02/16.  He verbalized understanding.

## 2016-07-02 ENCOUNTER — Ambulatory Visit (INDEPENDENT_AMBULATORY_CARE_PROVIDER_SITE_OTHER): Payer: Medicare Other | Admitting: *Deleted

## 2016-07-02 DIAGNOSIS — I4891 Unspecified atrial fibrillation: Secondary | ICD-10-CM

## 2016-07-02 DIAGNOSIS — Z7901 Long term (current) use of anticoagulants: Secondary | ICD-10-CM

## 2016-07-02 DIAGNOSIS — Z5181 Encounter for therapeutic drug level monitoring: Secondary | ICD-10-CM | POA: Diagnosis not present

## 2016-07-02 LAB — POCT INR: INR: 2.5

## 2016-07-08 DIAGNOSIS — Z96651 Presence of right artificial knee joint: Secondary | ICD-10-CM | POA: Diagnosis not present

## 2016-07-08 DIAGNOSIS — R29898 Other symptoms and signs involving the musculoskeletal system: Secondary | ICD-10-CM | POA: Diagnosis not present

## 2016-07-13 DIAGNOSIS — M6281 Muscle weakness (generalized): Secondary | ICD-10-CM | POA: Diagnosis not present

## 2016-07-13 DIAGNOSIS — Z96651 Presence of right artificial knee joint: Secondary | ICD-10-CM | POA: Diagnosis not present

## 2016-07-13 DIAGNOSIS — R262 Difficulty in walking, not elsewhere classified: Secondary | ICD-10-CM | POA: Diagnosis not present

## 2016-07-13 DIAGNOSIS — R29898 Other symptoms and signs involving the musculoskeletal system: Secondary | ICD-10-CM | POA: Diagnosis not present

## 2016-07-14 DIAGNOSIS — R29898 Other symptoms and signs involving the musculoskeletal system: Secondary | ICD-10-CM | POA: Diagnosis not present

## 2016-07-14 DIAGNOSIS — Z96651 Presence of right artificial knee joint: Secondary | ICD-10-CM | POA: Diagnosis not present

## 2016-07-14 DIAGNOSIS — M6281 Muscle weakness (generalized): Secondary | ICD-10-CM | POA: Diagnosis not present

## 2016-07-14 DIAGNOSIS — R262 Difficulty in walking, not elsewhere classified: Secondary | ICD-10-CM | POA: Diagnosis not present

## 2016-07-16 DIAGNOSIS — Z96651 Presence of right artificial knee joint: Secondary | ICD-10-CM | POA: Diagnosis not present

## 2016-07-16 DIAGNOSIS — M6281 Muscle weakness (generalized): Secondary | ICD-10-CM | POA: Diagnosis not present

## 2016-07-16 DIAGNOSIS — R262 Difficulty in walking, not elsewhere classified: Secondary | ICD-10-CM | POA: Diagnosis not present

## 2016-07-16 DIAGNOSIS — R29898 Other symptoms and signs involving the musculoskeletal system: Secondary | ICD-10-CM | POA: Diagnosis not present

## 2016-07-21 ENCOUNTER — Ambulatory Visit (INDEPENDENT_AMBULATORY_CARE_PROVIDER_SITE_OTHER): Payer: Medicare Other | Admitting: *Deleted

## 2016-07-21 DIAGNOSIS — Z7901 Long term (current) use of anticoagulants: Secondary | ICD-10-CM | POA: Diagnosis not present

## 2016-07-21 DIAGNOSIS — Z5181 Encounter for therapeutic drug level monitoring: Secondary | ICD-10-CM

## 2016-07-21 DIAGNOSIS — I4891 Unspecified atrial fibrillation: Secondary | ICD-10-CM | POA: Diagnosis not present

## 2016-07-21 DIAGNOSIS — E1342 Other specified diabetes mellitus with diabetic polyneuropathy: Secondary | ICD-10-CM | POA: Diagnosis not present

## 2016-07-21 DIAGNOSIS — S91339A Puncture wound without foreign body, unspecified foot, initial encounter: Secondary | ICD-10-CM | POA: Diagnosis not present

## 2016-07-21 LAB — POCT INR: INR: 2.3

## 2016-07-28 DIAGNOSIS — M795 Residual foreign body in soft tissue: Secondary | ICD-10-CM | POA: Diagnosis not present

## 2016-07-28 DIAGNOSIS — S91331D Puncture wound without foreign body, right foot, subsequent encounter: Secondary | ICD-10-CM | POA: Diagnosis not present

## 2016-07-29 ENCOUNTER — Other Ambulatory Visit (HOSPITAL_COMMUNITY): Payer: Self-pay | Admitting: Podiatry

## 2016-07-29 DIAGNOSIS — I10 Essential (primary) hypertension: Secondary | ICD-10-CM | POA: Diagnosis not present

## 2016-07-29 DIAGNOSIS — M795 Residual foreign body in soft tissue: Secondary | ICD-10-CM

## 2016-07-29 DIAGNOSIS — F419 Anxiety disorder, unspecified: Secondary | ICD-10-CM | POA: Diagnosis not present

## 2016-07-29 DIAGNOSIS — N390 Urinary tract infection, site not specified: Secondary | ICD-10-CM | POA: Diagnosis not present

## 2016-07-29 DIAGNOSIS — E1165 Type 2 diabetes mellitus with hyperglycemia: Secondary | ICD-10-CM | POA: Diagnosis not present

## 2016-07-29 DIAGNOSIS — L089 Local infection of the skin and subcutaneous tissue, unspecified: Secondary | ICD-10-CM | POA: Diagnosis not present

## 2016-07-29 DIAGNOSIS — E1142 Type 2 diabetes mellitus with diabetic polyneuropathy: Secondary | ICD-10-CM | POA: Diagnosis not present

## 2016-07-29 DIAGNOSIS — I4891 Unspecified atrial fibrillation: Secondary | ICD-10-CM | POA: Diagnosis not present

## 2016-07-29 DIAGNOSIS — S91331D Puncture wound without foreign body, right foot, subsequent encounter: Principal | ICD-10-CM

## 2016-07-29 DIAGNOSIS — E7800 Pure hypercholesterolemia, unspecified: Secondary | ICD-10-CM | POA: Diagnosis not present

## 2016-07-29 DIAGNOSIS — Z789 Other specified health status: Secondary | ICD-10-CM | POA: Diagnosis not present

## 2016-07-29 DIAGNOSIS — Z6841 Body Mass Index (BMI) 40.0 and over, adult: Secondary | ICD-10-CM | POA: Diagnosis not present

## 2016-08-03 ENCOUNTER — Ambulatory Visit (HOSPITAL_COMMUNITY)
Admission: RE | Admit: 2016-08-03 | Discharge: 2016-08-03 | Disposition: A | Discharge status: Home / Self Care | Payer: Medicare Other | Source: Ambulatory Visit | Attending: Podiatry | Admitting: Podiatry

## 2016-08-03 DIAGNOSIS — S91339A Puncture wound without foreign body, unspecified foot, initial encounter: Secondary | ICD-10-CM | POA: Diagnosis present

## 2016-08-03 DIAGNOSIS — X58XXXA Exposure to other specified factors, initial encounter: Secondary | ICD-10-CM | POA: Insufficient documentation

## 2016-08-03 DIAGNOSIS — L089 Local infection of the skin and subcutaneous tissue, unspecified: Secondary | ICD-10-CM | POA: Insufficient documentation

## 2016-08-03 DIAGNOSIS — S91341D Puncture wound with foreign body, right foot, subsequent encounter: Secondary | ICD-10-CM | POA: Diagnosis not present

## 2016-08-03 DIAGNOSIS — M795 Residual foreign body in soft tissue: Secondary | ICD-10-CM

## 2016-08-03 DIAGNOSIS — S91301A Unspecified open wound, right foot, initial encounter: Secondary | ICD-10-CM | POA: Diagnosis not present

## 2016-08-03 DIAGNOSIS — S91331D Puncture wound without foreign body, right foot, subsequent encounter: Secondary | ICD-10-CM

## 2016-08-07 DIAGNOSIS — S91331D Puncture wound without foreign body, right foot, subsequent encounter: Secondary | ICD-10-CM | POA: Diagnosis not present

## 2016-08-07 DIAGNOSIS — M795 Residual foreign body in soft tissue: Secondary | ICD-10-CM | POA: Diagnosis not present

## 2016-08-10 NOTE — Patient Instructions (Signed)
[facility]  08/10/2016     @PREFPERIOPPHARMACY @   Your procedure is scheduled on   08/12/2016   Report to [facility] at  9   A.M.  Call this number if you have problems the morning of surgery:  [phone]   Remember:  Do not eat food or drink liquids after midnight.  Take these medicines the morning of surgery with A SIP OF WATER  Amlodipine, digoxin, lisinopril. Take 1/2 of your usual insulin dosage. DO NOT take any medications for your diabetes the morning of your surgery.   Do not wear jewelry, make-up or nail polish.  Do not wear lotions, powders, or perfumes, or deoderant.  Do not shave 48 hours prior to surgery.  Men may shave face and neck.  Do not bring valuables to the hospital.  [facility] is not responsible for any belongings or valuables.  Contacts, dentures or bridgework may not be worn into surgery.  Leave your suitcase in the car.  After surgery it may be brought to your room.  For patients admitted to the hospital, discharge time will be determined by your treatment team.  Patients discharged the day of surgery will not be allowed to drive home.   Name and phone number of your driver:   family Special instructions:  Follow the instructions given to you from Dr Caprice Beaver concerning your coumadin.  Please read over the following fact sheets that you were given. Anesthesia Post-op Instructions and Care and Recovery After Surgery      Surgical Wound Debridement Surgical wound debridement is a procedure that removes dead or infected tissue and other substances from a wound. To heal, a wound must be clean. It also must have an adequate blood supply. Anything that prevents this must be taken out of the wound. This may include:  Dead tissue.  Scar tissue.  Fluid that has built up.  Debris from outside of the body. You may need this procedure if you have a wound that has not healed (chronic wound). Tell a health care provider about:  Any  allergies you have.  All medicines you are taking, including vitamins, herbs, eyedrops, creams, and over-the-counter medicines.  Any problems you or family members have had with anesthetic medicines.  Any blood disorders you have.  Any surgeries you have had.  Any medical conditions you have.  Whether you are pregnant or may be pregnant. What are the risks? Generally, this is a safe procedure. However, problems may occur, including:  Bleeding.  Infection.  Damage to nerves, blood vessels, or healthy tissue inside the wound.  Scarring and loss of function. What happens before the procedure?  Ask your health care provider about:  Changing or stopping your regular medicines. This is especially important if you are taking diabetes medicines or blood thinners.  Taking medicines such as aspirin and ibuprofen. These medicines can thin your blood. Do not take these medicines before your procedure if your health care provider instructs you not to.  Follow instructions from your health care provider about eating or drinking restrictions.  You may be given antibiotic medicine to help prevent or treat infection.  You may have blood tests.  Plan to have someone take you home after the procedure. What happens during the procedure?  To reduce your risk of infection:  Your health care team will wash or sanitize their hands.  Your skin will be washed with soap.  You may have small monitors  placed on your body. These are used to check your heart, blood pressure, and oxygen level.  An IV will be put in your hand or arm.  You will be given one or more of the following:  A medicine to help you relax (sedative).  A medicine to numb the area (local anesthetic).  A medicine to make you fall asleep (general anesthetic).  A medicine that is injected into your spine to numb the area below and slightly above the injection site (spinal anesthetic).  A medicine that is injected into an  area of your body to numb everything below the injection site (regional anesthetic).  Your health care provider will clean your wound with a sterile salt-water (saline) solution.  Your health care provider will use scissors, surgical knives (scalpels) and surgical tweezers (forceps), or a laser to remove dead tissue. Your health care provider will also remove any other material that should not be in the wound.  After the tissue and other materials have been removed from the wound, your health care provider will clean the wound again and apply a bandage (dressing). The procedure may vary among health care providers and hospitals. What happens after the procedure?  Your blood pressure, heart rate, breathing rate, and blood oxygen level will be monitored often until the medicines you were given have worn off.  You will be given medicine for pain.  You will continue to receive antibiotic medicine if it was started before your procedure. This information is not intended to replace advice given to you by your health care provider. Make sure you discuss any questions you have with your health care provider. Document Released: 06/17/2009 Document Revised: 08/29/2015 Document Reviewed: 08/01/2014 Elsevier Interactive Patient Education  2017 [employer].  Surgical Wound Debridement, Care After Refer to this sheet in the next few weeks. These instructions provide you with information about caring for yourself after your procedure. Your health care provider may also give you more specific instructions. Your treatment has been planned according to current medical practices, but problems sometimes occur. Call your health care provider if you have any problems or questions after your procedure. What can I expect after the procedure? After the procedure, it is common to have:  Pain or soreness.  Fluid that leaks from the wound.  Stiffness. Follow these instructions at home: Medicines   Take  over-the-counter and prescription medicines only as told by your health care provider.  If you were prescribed an antibiotic medicine, take it or apply it as told by your health care provider. Do not stop taking or using the antibiotic even if your condition improves. Wound care   Follow instructions from your health care provider about:  How to take care of your wound.  When and how you should change your dressing.  When you should remove your dressing. If your dressing is dry and stuck when you try to remove it, moisten or wet the dressing with saline or water so that it can be removed without harming your skin or wound tissue.  Check your wound every day for signs of infection. Have a caregiver do this for you if you are not able. Watch for:  More redness, swelling, or pain.  More fluid, blood, or pus.  A bad smell. Activity    Do not drive or operate heavy machinery while taking prescription pain medicine.  Ask your health care provider what activities are safe for you. General instructions   Eat a healthy diet with lots of  protein. Ask your health care provider to suggest the best diet for you.  Do not smoke. Smoking makes it harder for your body to heal.  Keep all follow-up visits as told by your health care provider. This is important.  Do not take baths, swim, or use a hot tub until your health care provider approves. Contact a health care provider if:  You have a fever.  Your pain medicine is not helping.  Your wound is red and swollen.  You have increased bleeding.  You have pus coming from your wound.  You have a bad smell coming from your wound.  Your wound is not getting better after 1-2 weeks of treatment.  You develop a new medical condition, such as diabetes, peripheral vascular disease, or conditions that affect your defense (immune) system. This information is not intended to replace advice given to you by your health care provider. Make sure you  discuss any questions you have with your health care provider. Document Released: 03/09/2012 Document Revised: 08/28/2015 Document Reviewed: 08/01/2014 Elsevier Interactive Patient Education  2017 [employer] PATIENT INSTRUCTIONS POST-ANESTHESIA  IMMEDIATELY FOLLOWING SURGERY:  Do not drive or operate machinery for the first twenty four hours after surgery.  Do not make any important decisions for twenty four hours after surgery or while taking narcotic pain medications or sedatives.  If you develop intractable nausea and vomiting or a severe headache please notify your doctor immediately.  FOLLOW-UP:  Please make an appointment with your surgeon as instructed. You do not need to follow up with anesthesia unless specifically instructed to do so.  WOUND CARE INSTRUCTIONS (if applicable):  Keep a dry clean dressing on the anesthesia/puncture wound site if there is drainage.  Once the wound has quit draining you may leave it open to air.  Generally you should leave the bandage intact for twenty four hours unless there is drainage.  If the epidural site drains for more than 36-48 hours please call the anesthesia department.  QUESTIONS?:  Please feel free to call your physician or the hospital operator if you have any questions, and they will be happy to assist you.

## 2016-08-11 ENCOUNTER — Encounter (HOSPITAL_COMMUNITY)
Admission: RE | Admit: 2016-08-11 | Discharge: 2016-08-11 | Disposition: A | Discharge status: Home / Self Care | Payer: Medicare Other | Source: Ambulatory Visit | Attending: Podiatry | Admitting: Podiatry

## 2016-08-11 ENCOUNTER — Encounter (HOSPITAL_COMMUNITY): Payer: Self-pay

## 2016-08-11 ENCOUNTER — Other Ambulatory Visit: Payer: Self-pay

## 2016-08-11 ENCOUNTER — Other Ambulatory Visit: Payer: Self-pay | Admitting: Podiatry

## 2016-08-11 DIAGNOSIS — I10 Essential (primary) hypertension: Secondary | ICD-10-CM | POA: Diagnosis not present

## 2016-08-11 DIAGNOSIS — M795 Residual foreign body in soft tissue: Secondary | ICD-10-CM | POA: Diagnosis not present

## 2016-08-11 DIAGNOSIS — Z9841 Cataract extraction status, right eye: Secondary | ICD-10-CM | POA: Diagnosis not present

## 2016-08-11 DIAGNOSIS — I2510 Atherosclerotic heart disease of native coronary artery without angina pectoris: Secondary | ICD-10-CM | POA: Diagnosis not present

## 2016-08-11 DIAGNOSIS — E1142 Type 2 diabetes mellitus with diabetic polyneuropathy: Secondary | ICD-10-CM | POA: Diagnosis not present

## 2016-08-11 DIAGNOSIS — Z96651 Presence of right artificial knee joint: Secondary | ICD-10-CM | POA: Diagnosis not present

## 2016-08-11 DIAGNOSIS — Z9889 Other specified postprocedural states: Secondary | ICD-10-CM | POA: Diagnosis not present

## 2016-08-11 DIAGNOSIS — Z9842 Cataract extraction status, left eye: Secondary | ICD-10-CM | POA: Diagnosis not present

## 2016-08-11 DIAGNOSIS — Z7901 Long term (current) use of anticoagulants: Secondary | ICD-10-CM | POA: Diagnosis not present

## 2016-08-11 DIAGNOSIS — Z8719 Personal history of other diseases of the digestive system: Secondary | ICD-10-CM | POA: Diagnosis not present

## 2016-08-11 DIAGNOSIS — Z87891 Personal history of nicotine dependence: Secondary | ICD-10-CM | POA: Diagnosis not present

## 2016-08-11 DIAGNOSIS — Z794 Long term (current) use of insulin: Secondary | ICD-10-CM | POA: Diagnosis not present

## 2016-08-11 DIAGNOSIS — I4891 Unspecified atrial fibrillation: Secondary | ICD-10-CM | POA: Diagnosis not present

## 2016-08-11 DIAGNOSIS — X58XXXA Exposure to other specified factors, initial encounter: Secondary | ICD-10-CM | POA: Diagnosis not present

## 2016-08-11 DIAGNOSIS — L089 Local infection of the skin and subcutaneous tissue, unspecified: Secondary | ICD-10-CM | POA: Diagnosis not present

## 2016-08-11 DIAGNOSIS — D72829 Elevated white blood cell count, unspecified: Secondary | ICD-10-CM | POA: Diagnosis not present

## 2016-08-11 DIAGNOSIS — S91331D Puncture wound without foreign body, right foot, subsequent encounter: Secondary | ICD-10-CM | POA: Diagnosis not present

## 2016-08-11 DIAGNOSIS — S91331A Puncture wound without foreign body, right foot, initial encounter: Secondary | ICD-10-CM | POA: Diagnosis not present

## 2016-08-11 HISTORY — DX: Cardiac arrhythmia, unspecified: I49.9

## 2016-08-11 LAB — CBC WITH DIFFERENTIAL/PLATELET
Basophils Absolute: 0.1 10*3/uL (ref 0.0–0.1)
Basophils Relative: 0 %
EOS PCT: 3 %
Eosinophils Absolute: 0.5 10*3/uL (ref 0.0–0.7)
HEMATOCRIT: 41.1 % (ref 39.0–52.0)
HEMOGLOBIN: 13.6 g/dL (ref 13.0–17.0)
LYMPHS ABS: 10.3 10*3/uL — AB (ref 0.7–4.0)
LYMPHS PCT: 64 %
MCH: 29.8 pg (ref 26.0–34.0)
MCHC: 33.1 g/dL (ref 30.0–36.0)
MCV: 90.1 fL (ref 78.0–100.0)
MONO ABS: 1.2 10*3/uL — AB (ref 0.1–1.0)
Monocytes Relative: 7 %
NEUTROS ABS: 4.2 10*3/uL (ref 1.7–7.7)
Neutrophils Relative %: 26 %
Platelets: 190 10*3/uL (ref 150–400)
RBC: 4.56 MIL/uL (ref 4.22–5.81)
RDW: 15.3 % (ref 11.5–15.5)
WBC: 16.2 10*3/uL — ABNORMAL HIGH (ref 4.0–10.5)

## 2016-08-11 LAB — SURGICAL PCR SCREEN
MRSA, PCR: NEGATIVE
Staphylococcus aureus: NEGATIVE

## 2016-08-11 LAB — BASIC METABOLIC PANEL
ANION GAP: 9 (ref 5–15)
BUN: 24 mg/dL — ABNORMAL HIGH (ref 6–20)
CALCIUM: 9.8 mg/dL (ref 8.9–10.3)
CHLORIDE: 102 mmol/L (ref 101–111)
CO2: 27 mmol/L (ref 22–32)
CREATININE: 0.94 mg/dL (ref 0.61–1.24)
GFR calc non Af Amer: >60 mL/min (ref >60)
GLUCOSE: 168 mg/dL — AB (ref 65–99)
Potassium: 3.9 mmol/L (ref 3.5–5.1)
Sodium: 138 mmol/L (ref 135–145)

## 2016-08-11 LAB — PROTIME-INR
INR: 2.01
Prothrombin Time: 23.1 seconds — ABNORMAL HIGH (ref 11.4–15.2)

## 2016-08-11 MED ORDER — CLINDAMYCIN PHOSPHATE 900 MG/50ML IV SOLN: 900.0000 mg | INTRAVENOUS | Status: DC

## 2016-08-11 MED ORDER — GENTAMICIN SULFATE 40 MG/ML IJ SOLN: 5.0000 mg/kg | INTRAVENOUS | Status: DC

## 2016-08-12 ENCOUNTER — Ambulatory Visit (HOSPITAL_COMMUNITY)
Admission: RE | Admit: 2016-08-12 | Discharge: 2016-08-12 | Disposition: A | Discharge status: Home / Self Care | Payer: Medicare Other | Source: Ambulatory Visit | Attending: Podiatry | Admitting: Podiatry

## 2016-08-12 ENCOUNTER — Encounter (HOSPITAL_COMMUNITY): Payer: Self-pay | Admitting: *Deleted

## 2016-08-12 ENCOUNTER — Ambulatory Visit (HOSPITAL_COMMUNITY): Payer: Medicare Other | Admitting: Anesthesiology

## 2016-08-12 ENCOUNTER — Encounter (HOSPITAL_COMMUNITY)
Admission: RE | Disposition: A | Discharge status: Home / Self Care | Payer: Self-pay | Source: Ambulatory Visit | Attending: Podiatry

## 2016-08-12 DIAGNOSIS — L089 Local infection of the skin and subcutaneous tissue, unspecified: Secondary | ICD-10-CM | POA: Insufficient documentation

## 2016-08-12 DIAGNOSIS — E1142 Type 2 diabetes mellitus with diabetic polyneuropathy: Secondary | ICD-10-CM | POA: Diagnosis not present

## 2016-08-12 DIAGNOSIS — I10 Essential (primary) hypertension: Secondary | ICD-10-CM | POA: Diagnosis not present

## 2016-08-12 DIAGNOSIS — S91341A Puncture wound with foreign body, right foot, initial encounter: Secondary | ICD-10-CM | POA: Diagnosis not present

## 2016-08-12 DIAGNOSIS — S91331D Puncture wound without foreign body, right foot, subsequent encounter: Secondary | ICD-10-CM

## 2016-08-12 DIAGNOSIS — Z96651 Presence of right artificial knee joint: Secondary | ICD-10-CM | POA: Insufficient documentation

## 2016-08-12 DIAGNOSIS — D72829 Elevated white blood cell count, unspecified: Secondary | ICD-10-CM | POA: Diagnosis not present

## 2016-08-12 DIAGNOSIS — Z87891 Personal history of nicotine dependence: Secondary | ICD-10-CM | POA: Insufficient documentation

## 2016-08-12 DIAGNOSIS — Z794 Long term (current) use of insulin: Secondary | ICD-10-CM | POA: Diagnosis not present

## 2016-08-12 DIAGNOSIS — I2510 Atherosclerotic heart disease of native coronary artery without angina pectoris: Secondary | ICD-10-CM | POA: Diagnosis not present

## 2016-08-12 DIAGNOSIS — Z8719 Personal history of other diseases of the digestive system: Secondary | ICD-10-CM | POA: Diagnosis not present

## 2016-08-12 DIAGNOSIS — Z9889 Other specified postprocedural states: Secondary | ICD-10-CM | POA: Insufficient documentation

## 2016-08-12 DIAGNOSIS — S91331A Puncture wound without foreign body, right foot, initial encounter: Secondary | ICD-10-CM | POA: Insufficient documentation

## 2016-08-12 DIAGNOSIS — Z9842 Cataract extraction status, left eye: Secondary | ICD-10-CM | POA: Insufficient documentation

## 2016-08-12 DIAGNOSIS — Z7901 Long term (current) use of anticoagulants: Secondary | ICD-10-CM | POA: Diagnosis not present

## 2016-08-12 DIAGNOSIS — X58XXXA Exposure to other specified factors, initial encounter: Secondary | ICD-10-CM | POA: Insufficient documentation

## 2016-08-12 DIAGNOSIS — I4891 Unspecified atrial fibrillation: Secondary | ICD-10-CM | POA: Insufficient documentation

## 2016-08-12 DIAGNOSIS — Z9841 Cataract extraction status, right eye: Secondary | ICD-10-CM | POA: Insufficient documentation

## 2016-08-12 HISTORY — PX: INCISION AND DRAINAGE OF WOUND: SHX1803

## 2016-08-12 HISTORY — PX: WOUND EXPLORATION: SHX6188

## 2016-08-12 LAB — GLUCOSE, CAPILLARY
GLUCOSE-CAPILLARY: 104 mg/dL — AB (ref 65–99)
Glucose-Capillary: 116 mg/dL — ABNORMAL HIGH (ref 65–99)

## 2016-08-12 LAB — PATHOLOGIST SMEAR REVIEW

## 2016-08-12 SURGERY — IRRIGATION AND DEBRIDEMENT WOUND
Anesthesia: Monitor Anesthesia Care | Laterality: Right

## 2016-08-12 MED ORDER — PROPOFOL 10 MG/ML IV BOLUS
INTRAVENOUS | Status: AC
  Filled 2016-08-12: qty 40

## 2016-08-12 MED ORDER — PROPOFOL 500 MG/50ML IV EMUL
INTRAVENOUS | Status: DC | PRN
  Administered 2016-08-12: 100 ug/kg/min via INTRAVENOUS
  Administered 2016-08-12 (×2): via INTRAVENOUS

## 2016-08-12 MED ORDER — FENTANYL CITRATE (PF) 100 MCG/2ML IJ SOLN: 25.0000 ug | INTRAMUSCULAR | Status: DC | PRN

## 2016-08-12 MED ORDER — LIDOCAINE HCL (PF) 1 % IJ SOLN
INTRAMUSCULAR | Status: AC
  Filled 2016-08-12: qty 30

## 2016-08-12 MED ORDER — CHLORHEXIDINE GLUCONATE CLOTH 2 % EX PADS: 6.0000 | MEDICATED_PAD | Freq: Once | CUTANEOUS | Status: DC

## 2016-08-12 MED ORDER — MIDAZOLAM HCL 2 MG/2ML IJ SOLN
INTRAMUSCULAR | Status: AC
  Filled 2016-08-12: qty 2

## 2016-08-12 MED ORDER — FENTANYL CITRATE (PF) 100 MCG/2ML IJ SOLN
INTRAMUSCULAR | Status: DC | PRN
  Administered 2016-08-12 (×2): 25 ug via INTRAVENOUS

## 2016-08-12 MED ORDER — BUPIVACAINE HCL (PF) 0.5 % IJ SOLN
INTRAMUSCULAR | Status: AC
  Filled 2016-08-12: qty 60

## 2016-08-12 MED ORDER — MIDAZOLAM HCL 5 MG/5ML IJ SOLN
INTRAMUSCULAR | Status: DC | PRN
  Administered 2016-08-12 (×2): 1 mg via INTRAVENOUS

## 2016-08-12 MED ORDER — SODIUM CHLORIDE 0.9 % IR SOLN
Status: DC | PRN
  Administered 2016-08-12: 3000 mL

## 2016-08-12 MED ORDER — MIDAZOLAM HCL 2 MG/2ML IJ SOLN
1.0000 mg | INTRAMUSCULAR | Status: AC
  Administered 2016-08-12: 2 mg via INTRAVENOUS

## 2016-08-12 MED ORDER — FENTANYL CITRATE (PF) 100 MCG/2ML IJ SOLN
INTRAMUSCULAR | Status: AC
  Filled 2016-08-12: qty 2

## 2016-08-12 MED ORDER — PROPOFOL 10 MG/ML IV BOLUS
INTRAVENOUS | Status: AC
  Filled 2016-08-12: qty 20

## 2016-08-12 MED ORDER — LACTATED RINGERS IV SOLN
INTRAVENOUS | Status: DC
  Administered 2016-08-12: 07:00:00 via INTRAVENOUS

## 2016-08-12 MED ORDER — LIDOCAINE-EPINEPHRINE (PF) 1 %-1:200000 IJ SOLN
INTRAMUSCULAR | Status: AC
  Filled 2016-08-12: qty 30

## 2016-08-12 MED ORDER — CLINDAMYCIN PHOSPHATE 600 MG/50ML IV SOLN
600.0000 mg | INTRAVENOUS | Status: AC
  Administered 2016-08-12: 600 mg via INTRAVENOUS
  Filled 2016-08-12: qty 50

## 2016-08-12 MED ORDER — BUPIVACAINE HCL (PF) 0.5 % IJ SOLN
INTRAMUSCULAR | Status: DC | PRN
  Administered 2016-08-12: 20 mL

## 2016-08-12 MED ORDER — FENTANYL CITRATE (PF) 100 MCG/2ML IJ SOLN
25.0000 ug | Freq: Once | INTRAMUSCULAR | Status: AC
  Administered 2016-08-12: 25 ug via INTRAVENOUS

## 2016-08-12 MED ORDER — LIDOCAINE-EPINEPHRINE 0.5 %-1:200000 IJ SOLN
INTRAMUSCULAR | Status: DC | PRN
  Administered 2016-08-12: 9 mL

## 2016-08-12 MED ORDER — SODIUM CHLORIDE 0.9 % IR SOLN
Status: DC | PRN
  Administered 2016-08-12: 1000 mL

## 2016-08-12 MED ORDER — EPINEPHRINE PF 1 MG/ML IJ SOLN
INTRAMUSCULAR | Status: AC
  Filled 2016-08-12: qty 1

## 2016-08-12 MED ORDER — MIDAZOLAM HCL 2 MG/2ML IJ SOLN
INTRAMUSCULAR | Status: AC
Start: 2016-08-12 — End: ?
  Filled 2016-08-12: qty 2

## 2016-08-12 SURGICAL SUPPLY — 42 items
APL SKNCLS STERI-STRIP NONHPOA (GAUZE/BANDAGES/DRESSINGS) ×1
BAG HAMPER (MISCELLANEOUS) ×2 IMPLANT
BANDAGE ELASTIC 4 VELCRO NS (GAUZE/BANDAGES/DRESSINGS) ×2 IMPLANT
BANDAGE ESMARK 4X12 BL STRL LF (DISPOSABLE) ×1 IMPLANT
BENZOIN TINCTURE PRP APPL 2/3 (GAUZE/BANDAGES/DRESSINGS) ×2 IMPLANT
BLADE 15 SAFETY STRL DISP (BLADE) ×4 IMPLANT
BNDG CMPR 12X4 ELC STRL LF (DISPOSABLE) ×1
BNDG ESMARK 4X12 BLUE STRL LF (DISPOSABLE) ×2
BNDG GAUZE ELAST 4 BULKY (GAUZE/BANDAGES/DRESSINGS) ×2 IMPLANT
BOOT STEPPER DURA LG (SOFTGOODS) ×1 IMPLANT
CLOTH BEACON ORANGE TIMEOUT ST (SAFETY) ×2 IMPLANT
COVER LIGHT HANDLE STERIS (MISCELLANEOUS) ×4 IMPLANT
CUFF TOURNIQUET SINGLE 24IN (TOURNIQUET CUFF) ×1 IMPLANT
DECANTER SPIKE VIAL GLASS SM (MISCELLANEOUS) ×3 IMPLANT
DRAPE OEC MINIVIEW 54X84 (DRAPES) ×2 IMPLANT
DRSG ADAPTIC 3X8 NADH LF (GAUZE/BANDAGES/DRESSINGS) ×2 IMPLANT
ELECT REM PT RETURN 9FT ADLT (ELECTROSURGICAL) ×2
ELECTRODE REM PT RTRN 9FT ADLT (ELECTROSURGICAL) ×1 IMPLANT
GAUZE PACKING IODOFORM 1/4X15 (GAUZE/BANDAGES/DRESSINGS) ×1 IMPLANT
GAUZE SPONGE 4X4 12PLY STRL (GAUZE/BANDAGES/DRESSINGS) ×2 IMPLANT
GLOVE BIO SURGEON STRL SZ7.5 (GLOVE) ×2 IMPLANT
GLOVE BIOGEL PI IND STRL 7.0 (GLOVE) ×1 IMPLANT
GLOVE BIOGEL PI INDICATOR 7.0 (GLOVE) ×1
GOWN STRL REUS W/TWL LRG LVL3 (GOWN DISPOSABLE) ×6 IMPLANT
HANDPIECE INTERPULSE COAX TIP (DISPOSABLE) ×2
IV NS IRRIG 3000ML ARTHROMATIC (IV SOLUTION) ×1 IMPLANT
KIT ROOM TURNOVER AP CYSTO (KITS) ×2 IMPLANT
MANIFOLD NEPTUNE II (INSTRUMENTS) ×2 IMPLANT
NDL HYPO 27GX1-1/4 (NEEDLE) ×3 IMPLANT
NEEDLE HYPO 27GX1-1/4 (NEEDLE) ×6 IMPLANT
NS IRRIG 1000ML POUR BTL (IV SOLUTION) ×2 IMPLANT
PACK BASIC LIMB (CUSTOM PROCEDURE TRAY) ×2 IMPLANT
PAD ARMBOARD 7.5X6 YLW CONV (MISCELLANEOUS) ×2 IMPLANT
SET BASIN LINEN APH (SET/KITS/TRAYS/PACK) ×2 IMPLANT
SET HNDPC FAN SPRY TIP SCT (DISPOSABLE) IMPLANT
STRIP CLOSURE SKIN 1/2X4 (GAUZE/BANDAGES/DRESSINGS) ×4 IMPLANT
SUT MON AB 5-0 PS2 18 (SUTURE) ×2 IMPLANT
SUT VIC AB 2-0 CT2 27 (SUTURE) ×2 IMPLANT
SUT VIC AB 4-0 PS2 27 (SUTURE) ×2 IMPLANT
SUT VICRYL AB 3-0 FS1 BRD 27IN (SUTURE) ×2 IMPLANT
SYR BULB IRRIGATION 50ML (SYRINGE) ×3 IMPLANT
SYR CONTROL 10ML LL (SYRINGE) ×5 IMPLANT

## 2016-08-12 NOTE — Discharge Instructions (Signed)
These instructions will give you an idea of what to expect after surgery and how to manage issues that may arise before your first post op office visit.  Pain Management Pain is best managed by staying ahead of it. If pain gets out of control, it is difficult to get it back under control. Local anesthesia that lasts 6-8 hours is used to numb the foot and decrease pain.  For the best pain control, take the pain medication every 4 hours for the first 2 days post op. On the third day pain medication can be taken as needed.   Post Op Nausea Nausea is common after surgery, so it is managed proactively.  If prescribed, use the prescribed nausea medication regularly for the first 2 days post op.  Bandages Do not worry if there is blood on the bandage. What looks like a lot of blood on the bandage is actually a small amount. Blood on the dressing spreads out as it is absorbed by the gauze, the same way a drop of water spreads out on a paper towel.  If the bandages feel wet or dry, stiff and uncomfortable, call the office during office hours and we will schedule a time for you to have the bandage changed.  Unless you are specifically told otherwise, we will do the first bandage change in the office.  Keep your bandage dry. If the bandage becomes wet or soiled, notify the office and we will schedule a time to change the bandage.  Activity It is best to spend most of the first 2 days after surgery lying down with the foot elevated above the level of your heart. You may put weight on your heel while wearing the CAM Walker.   You may only get up to go to the restroom.  Driving Do not drive until you are able to respond in an emergency (i.e. slam on the brakes). This usually occurs after the bone has healed - 6 to 8 weeks.  Call the Office If you have a fever over 101F.  If you have increasing pain after the initial post op pain has settled down.  If you have increasing redness, swelling, or drainage.   If you have any questions or concerns.      PATIENT INSTRUCTIONS POST-ANESTHESIA  IMMEDIATELY FOLLOWING SURGERY:  Do not drive or operate machinery for the first twenty four hours after surgery.  Do not make any important decisions for twenty four hours after surgery or while taking narcotic pain medications or sedatives.  If you develop intractable nausea and vomiting or a severe headache please notify your doctor immediately.  FOLLOW-UP:  Please make an appointment with your surgeon as instructed. You do not need to follow up with anesthesia unless specifically instructed to do so.  WOUND CARE INSTRUCTIONS (if applicable):  Keep a dry clean dressing on the anesthesia/puncture wound site if there is drainage.  Once the wound has quit draining you may leave it open to air.  Generally you should leave the bandage intact for twenty four hours unless there is drainage.  If the epidural site drains for more than 36-48 hours please call the anesthesia department.  QUESTIONS?:  Please feel free to call your physician or the hospital operator if you have any questions, and they will be happy to assist you.

## 2016-08-12 NOTE — Op Note (Signed)
OPERATIVE NOTE  DATE OF PROCEDURE 08/12/2016  SURGEON Marcheta Grammes, DPM  ASSISTANT SURGEON Jilda Panda, DPM  OR STAFF Circulator: Cox, Gershon Mussel, RN Scrub Person: Karin Lieu, CST Circulator Assistant: Towanda Malkin, RN   PREOPERATIVE DIAGNOSIS 1.  Puncture wound with infection, right heel 2.  Possible foreign body, right heel 3.  Diabetes mellitus with peripheral neuropathy  POSTOPERATIVE DIAGNOSIS 1.  Puncture wound with infection, right heel 2.  Diabetes mellitus with peripheral neuropathy  PROCEDURE 1.  Debridement of skin and subcutaneous tissue, right heel 2.  Irrigation of puncture wound, right heel  ANESTHESIA Monitor Anesthesia Care   HEMOSTASIS Pneumatic ankle tourniquet set at 250 mmHg  ESTIMATED BLOOD LOSS Minimal (<5 cc)  MATERIALS USED None  INJECTABLES 0.5% Marcaine plain 1% Lidocaine with epi  PATHOLOGY Aerobic culture Anaerobic culture  COMPLICATIONS None  INDICATIONS:  Nonhealing puncture wound of the right heel  DESCRIPTION OF THE PROCEDURE:  The patient was brought to the operating room and placed on the operative table in the supine position.  A pneumatic ankle tourniquet was placed about the patient's right ankle.  The foot was anesthetized with 0.5% Marcaine plain.  The foot was scrubbed, prepped and draped in the usual sterile manner.  The limb was then elevated and the pneumatic ankle tourniquet inflated to 250 mmHg.  Attention was directed to the plantar lateral aspect of the right heel where the puncture wound was identified.  The wound measured 1.8 centimeters in length by 0.8 centimeters in width by 3.2 centimeters in depth.  The wound margins were macerated and red.  Two converging semi-elliptical incisions were made with a number 15 blade encompassing the wound itself in its entirety.  The wedge of skin was removed and passed from the operative field.  1% Lidocaine with epi was injected locally.  Aerobic and anaerobic  cultures were obtained.  The wound bed was debrided free of fibrotic tissue and fat necrosis.  No foreign body was encountered.  Fluoroscopy was used to evaluate for presence of foreign body.  No foreign body was visualized.  The wound was irrigated with 3 L of saline using pulse lavage.  The wound was packed with gauze.  A sterile compressive dressing was applied.  The pneumatic ankle tourniquet was deflated and a hyperemic response was noted to all digits of the right foot.  The patient tolerated the procedure well.  The patient was then transferred to PACU with vital signs stable and vascular status intact to all toes of the operative foot.

## 2016-08-12 NOTE — Transfer of Care (Signed)
Immediate Anesthesia Transfer of Care Note  Patient: [name] [name]  Procedure(s) Performed: Procedure(s) with comments: DEBRIDEMENT WOUND RIGHT HEEL (Right) - right heel EXPLORATION OF WOUND FOR FOREIGN BODY RIGHT HEEL (Right) - right heel  Patient Location: PACU  Anesthesia Type:MAC  Level of Consciousness: awake and patient cooperative  Airway & Oxygen Therapy: Patient Spontanous Breathing and Patient connected to face mask oxygen  Post-op Assessment: Report given to RN, Post -op Vital signs reviewed and stable and Patient moving all extremities  Post vital signs: Reviewed and stable  Last Vitals:  Vitals:   08/12/16 0712 08/12/16 0729  BP: 124/65   Pulse:    Resp: 15 18  Temp:      Last Pain:  Vitals:   08/12/16 0641  TempSrc: Oral      Patients Stated Pain Goal: 5 (58/85/02 7741)  Complications: No apparent anesthesia complications

## 2016-08-12 NOTE — Anesthesia Postprocedure Evaluation (Signed)
Anesthesia Post Note  Patient: [name] [name]  Procedure(s) Performed: Procedure(s) (LRB): DEBRIDEMENT WOUND RIGHT HEEL (Right) EXPLORATION OF WOUND FOR FOREIGN BODY RIGHT HEEL (Right)  Patient location during evaluation: PACU Anesthesia Type: MAC Level of consciousness: awake and patient cooperative Pain management: pain level controlled Vital Signs Assessment: post-procedure vital signs reviewed and stable Respiratory status: spontaneous breathing, nonlabored ventilation and respiratory function stable Cardiovascular status: blood pressure returned to baseline Postop Assessment: no signs of nausea or vomiting Anesthetic complications: no     Last Vitals:  Vitals:   08/12/16 0712 08/12/16 0729  BP: 124/65   Pulse:    Resp: 15 18  Temp:      Last Pain:  Vitals:   08/12/16 0641  TempSrc: Oral                 Anuj Summons J

## 2016-08-12 NOTE — Brief Op Note (Signed)
BRIEF OPERATIVE NOTE  DATE OF PROCEDURE 08/12/2016  SURGEON Marcheta Grammes, DPM  ASSISTANT SURGEON Jilda Panda, DPM  OR STAFF Circulator: Cox, Gershon Mussel, RN Scrub Person: Karin Lieu, CST Circulator Assistant: Towanda Malkin, RN   PREOPERATIVE DIAGNOSIS 1.  Puncture wound with infection, right heel 2.  Possible foreign body, right heel 3.  Diabetes mellitus with peripheral neuropathy  POSTOPERATIVE DIAGNOSIS 1.  Puncture wound with infection, right heel 2.  Diabetes mellitus with peripheral neuropathy  PROCEDURE 1.  Debridement of skin and subcutaneous tissue, right heel 2.  Irrigation of puncture wound, right heel  ANESTHESIA Monitor Anesthesia Care   HEMOSTASIS Pneumatic ankle tourniquet set at 250 mmHg  ESTIMATED BLOOD LOSS Minimal (<5 cc)  MATERIALS USED None  INJECTABLES 0.5% Marcaine plain 1% Lidocaine with epi  PATHOLOGY Aerobic culture Anaerobic culture  COMPLICATIONS None  INDICATIONS:  Nonhealing puncture wound of the right heel

## 2016-08-12 NOTE — H&P (Signed)
HISTORY AND PHYSICAL INTERVAL NOTE:  08/12/2016  7:03 AM  [name]  has presented today for surgery, with the diagnosis of puncture wound with infection right heel, possible foreign body right heel, diabetes mellitus, peripheral neuropathy.  The various methods of treatment have been discussed with the patient.  No guarantees were given.  After consideration of risks, benefits and other options for treatment, the patient has consented to surgery.  I have reviewed the patients' chart and labs.    Patient Vitals for the past 24 hrs:  BP Temp Temp src Pulse Resp SpO2  08/12/16 0641 120/63 97.6 F (36.4 C) Oral 71 18 94 %    A history and physical examination was performed in my office.  The patient was reexamined.  There have been no changes to this history and physical examination.  Marcheta Grammes, DPM

## 2016-08-12 NOTE — Anesthesia Preprocedure Evaluation (Signed)
Anesthesia Evaluation  Patient identified by MRN, date of birth, ID band Patient awake    Reviewed: Allergy & Precautions, NPO status , Patient's Chart, lab work & pertinent test results  Airway Mallampati: II  TM Distance: >3 FB Neck ROM: Full    Dental  (+) Partial Upper   Pulmonary former smoker,    breath sounds clear to auscultation       Cardiovascular hypertension, Pt. on medications + CAD  + dysrhythmias Atrial Fibrillation  Rhythm:Regular Rate:Normal     Neuro/Psych negative neurological ROS  negative psych ROS   GI/Hepatic negative GI ROS, Neg liver ROS,   Endo/Other  diabetes, Type 2, Insulin Dependent, Oral Hypoglycemic Agents  Renal/GU Renal disease  negative genitourinary   Musculoskeletal negative musculoskeletal ROS (+)   Abdominal   Peds negative pediatric ROS (+)  Hematology negative hematology ROS (+)   Anesthesia Other Findings   Reproductive/Obstetrics negative OB ROS                             Anesthesia Physical Anesthesia Plan  ASA: III  Anesthesia Plan: MAC   Post-op Pain Management:    Induction: Intravenous  Airway Management Planned: Natural Airway and Simple Face Mask  Additional Equipment:   Intra-op Plan:   Post-operative Plan:   Informed Consent: I have reviewed the patients History and Physical, chart, labs and discussed the procedure including the risks, benefits and alternatives for the proposed anesthesia with the patient or authorized representative who has indicated his/her understanding and acceptance.     Plan Discussed with: CRNA  Anesthesia Plan Comments:         Anesthesia Quick Evaluation

## 2016-08-13 ENCOUNTER — Other Ambulatory Visit: Payer: Self-pay | Admitting: Podiatry

## 2016-08-13 ENCOUNTER — Encounter (HOSPITAL_COMMUNITY): Payer: Self-pay | Admitting: Podiatry

## 2016-08-14 ENCOUNTER — Encounter (HOSPITAL_COMMUNITY): Payer: Self-pay

## 2016-08-15 DIAGNOSIS — Z7901 Long term (current) use of anticoagulants: Secondary | ICD-10-CM | POA: Diagnosis not present

## 2016-08-15 DIAGNOSIS — T8189XD Other complications of procedures, not elsewhere classified, subsequent encounter: Secondary | ICD-10-CM | POA: Diagnosis not present

## 2016-08-15 DIAGNOSIS — I4891 Unspecified atrial fibrillation: Secondary | ICD-10-CM | POA: Diagnosis not present

## 2016-08-15 DIAGNOSIS — E119 Type 2 diabetes mellitus without complications: Secondary | ICD-10-CM | POA: Diagnosis not present

## 2016-08-15 DIAGNOSIS — I2510 Atherosclerotic heart disease of native coronary artery without angina pectoris: Secondary | ICD-10-CM | POA: Diagnosis not present

## 2016-08-15 DIAGNOSIS — I10 Essential (primary) hypertension: Secondary | ICD-10-CM | POA: Diagnosis not present

## 2016-08-17 DIAGNOSIS — I2510 Atherosclerotic heart disease of native coronary artery without angina pectoris: Secondary | ICD-10-CM | POA: Diagnosis not present

## 2016-08-17 DIAGNOSIS — I4891 Unspecified atrial fibrillation: Secondary | ICD-10-CM | POA: Diagnosis not present

## 2016-08-17 DIAGNOSIS — T8189XD Other complications of procedures, not elsewhere classified, subsequent encounter: Secondary | ICD-10-CM | POA: Diagnosis not present

## 2016-08-17 DIAGNOSIS — Z7901 Long term (current) use of anticoagulants: Secondary | ICD-10-CM | POA: Diagnosis not present

## 2016-08-17 DIAGNOSIS — E119 Type 2 diabetes mellitus without complications: Secondary | ICD-10-CM | POA: Diagnosis not present

## 2016-08-17 DIAGNOSIS — I10 Essential (primary) hypertension: Secondary | ICD-10-CM | POA: Diagnosis not present

## 2016-08-17 LAB — AEROBIC/ANAEROBIC CULTURE (SURGICAL/DEEP WOUND)

## 2016-08-17 LAB — AEROBIC/ANAEROBIC CULTURE W GRAM STAIN (SURGICAL/DEEP WOUND)
Culture: NORMAL
Gram Stain: NONE SEEN

## 2016-08-19 DIAGNOSIS — Z7901 Long term (current) use of anticoagulants: Secondary | ICD-10-CM | POA: Diagnosis not present

## 2016-08-19 DIAGNOSIS — E119 Type 2 diabetes mellitus without complications: Secondary | ICD-10-CM | POA: Diagnosis not present

## 2016-08-19 DIAGNOSIS — T8189XD Other complications of procedures, not elsewhere classified, subsequent encounter: Secondary | ICD-10-CM | POA: Diagnosis not present

## 2016-08-19 DIAGNOSIS — I4891 Unspecified atrial fibrillation: Secondary | ICD-10-CM | POA: Diagnosis not present

## 2016-08-19 DIAGNOSIS — I2510 Atherosclerotic heart disease of native coronary artery without angina pectoris: Secondary | ICD-10-CM | POA: Diagnosis not present

## 2016-08-19 DIAGNOSIS — I10 Essential (primary) hypertension: Secondary | ICD-10-CM | POA: Diagnosis not present

## 2016-08-24 DIAGNOSIS — I10 Essential (primary) hypertension: Secondary | ICD-10-CM | POA: Diagnosis not present

## 2016-08-24 DIAGNOSIS — Z7901 Long term (current) use of anticoagulants: Secondary | ICD-10-CM | POA: Diagnosis not present

## 2016-08-24 DIAGNOSIS — E119 Type 2 diabetes mellitus without complications: Secondary | ICD-10-CM | POA: Diagnosis not present

## 2016-08-24 DIAGNOSIS — T8189XD Other complications of procedures, not elsewhere classified, subsequent encounter: Secondary | ICD-10-CM | POA: Diagnosis not present

## 2016-08-24 DIAGNOSIS — I4891 Unspecified atrial fibrillation: Secondary | ICD-10-CM | POA: Diagnosis not present

## 2016-08-24 DIAGNOSIS — I2510 Atherosclerotic heart disease of native coronary artery without angina pectoris: Secondary | ICD-10-CM | POA: Diagnosis not present

## 2016-08-26 DIAGNOSIS — I2510 Atherosclerotic heart disease of native coronary artery without angina pectoris: Secondary | ICD-10-CM | POA: Diagnosis not present

## 2016-08-26 DIAGNOSIS — T8189XD Other complications of procedures, not elsewhere classified, subsequent encounter: Secondary | ICD-10-CM | POA: Diagnosis not present

## 2016-08-26 DIAGNOSIS — Z7901 Long term (current) use of anticoagulants: Secondary | ICD-10-CM | POA: Diagnosis not present

## 2016-08-26 DIAGNOSIS — E119 Type 2 diabetes mellitus without complications: Secondary | ICD-10-CM | POA: Diagnosis not present

## 2016-08-26 DIAGNOSIS — I4891 Unspecified atrial fibrillation: Secondary | ICD-10-CM | POA: Diagnosis not present

## 2016-08-26 DIAGNOSIS — I10 Essential (primary) hypertension: Secondary | ICD-10-CM | POA: Diagnosis not present

## 2016-08-27 ENCOUNTER — Other Ambulatory Visit (INDEPENDENT_AMBULATORY_CARE_PROVIDER_SITE_OTHER): Payer: Self-pay | Admitting: *Deleted

## 2016-08-27 ENCOUNTER — Encounter (INDEPENDENT_AMBULATORY_CARE_PROVIDER_SITE_OTHER): Payer: Self-pay | Admitting: *Deleted

## 2016-08-27 DIAGNOSIS — K921 Melena: Secondary | ICD-10-CM

## 2016-08-28 ENCOUNTER — Telehealth: Payer: Self-pay | Admitting: *Deleted

## 2016-08-28 DIAGNOSIS — L03119 Cellulitis of unspecified part of limb: Secondary | ICD-10-CM | POA: Diagnosis not present

## 2016-08-28 DIAGNOSIS — S91331D Puncture wound without foreign body, right foot, subsequent encounter: Secondary | ICD-10-CM | POA: Diagnosis not present

## 2016-08-28 DIAGNOSIS — E1342 Other specified diabetes mellitus with diabetic polyneuropathy: Secondary | ICD-10-CM | POA: Diagnosis not present

## 2016-08-28 NOTE — Telephone Encounter (Signed)
Dr. Caprice Beaver from [facility] called in regards to [name] [name]. States that he stepped on a toothpick. Has a wound to his foot that he is treating. He wanted our Coumdin Nurse to know that he is being placed on Cipro 500mg  for 7 days as of today.  [name] [name] has follow up appointment on Tuesday in the [facility].

## 2016-08-28 NOTE — Telephone Encounter (Signed)
Returned a call to the pt since he is starting Cipro 500mg  twice a daily starting today for 7 days & the med interacts with Coumadin.  Confirmed dose with Victor. Since the med interacts with Coumadin called the pt back & instructed the pt to take 2.5mg  Coumadin on Saturday, and Sunday. Spoke with pt & wife to inform them on the Coumadin dose & they repeated the dose back to me confirming what to do.  Also, changed pt's appt to morning from afternoon on 09/01/16 as they have another appt in the afternoon, too.

## 2016-09-01 ENCOUNTER — Ambulatory Visit (INDEPENDENT_AMBULATORY_CARE_PROVIDER_SITE_OTHER): Payer: Medicare Other | Admitting: *Deleted

## 2016-09-01 ENCOUNTER — Encounter: Payer: Medicare Other | Attending: Internal Medicine | Admitting: Internal Medicine

## 2016-09-01 DIAGNOSIS — Z5181 Encounter for therapeutic drug level monitoring: Secondary | ICD-10-CM

## 2016-09-01 DIAGNOSIS — E1151 Type 2 diabetes mellitus with diabetic peripheral angiopathy without gangrene: Secondary | ICD-10-CM | POA: Diagnosis not present

## 2016-09-01 DIAGNOSIS — E11628 Type 2 diabetes mellitus with other skin complications: Secondary | ICD-10-CM | POA: Diagnosis not present

## 2016-09-01 DIAGNOSIS — Z880 Allergy status to penicillin: Secondary | ICD-10-CM | POA: Insufficient documentation

## 2016-09-01 DIAGNOSIS — I4891 Unspecified atrial fibrillation: Secondary | ICD-10-CM | POA: Diagnosis not present

## 2016-09-01 DIAGNOSIS — L97414 Non-pressure chronic ulcer of right heel and midfoot with necrosis of bone: Secondary | ICD-10-CM | POA: Insufficient documentation

## 2016-09-01 DIAGNOSIS — I2510 Atherosclerotic heart disease of native coronary artery without angina pectoris: Secondary | ICD-10-CM | POA: Diagnosis not present

## 2016-09-01 DIAGNOSIS — E11621 Type 2 diabetes mellitus with foot ulcer: Secondary | ICD-10-CM | POA: Insufficient documentation

## 2016-09-01 DIAGNOSIS — Z87891 Personal history of nicotine dependence: Secondary | ICD-10-CM | POA: Diagnosis not present

## 2016-09-01 DIAGNOSIS — Z7901 Long term (current) use of anticoagulants: Secondary | ICD-10-CM | POA: Diagnosis not present

## 2016-09-01 DIAGNOSIS — L089 Local infection of the skin and subcutaneous tissue, unspecified: Secondary | ICD-10-CM | POA: Diagnosis not present

## 2016-09-01 DIAGNOSIS — S91301A Unspecified open wound, right foot, initial encounter: Secondary | ICD-10-CM | POA: Diagnosis not present

## 2016-09-01 DIAGNOSIS — L03115 Cellulitis of right lower limb: Secondary | ICD-10-CM | POA: Insufficient documentation

## 2016-09-01 LAB — POCT INR: INR: 2

## 2016-09-02 ENCOUNTER — Other Ambulatory Visit: Payer: Self-pay | Admitting: Internal Medicine

## 2016-09-02 ENCOUNTER — Other Ambulatory Visit
Admission: RE | Admit: 2016-09-02 | Discharge: 2016-09-02 | Disposition: A | Discharge status: Home / Self Care | Payer: Medicare Other | Source: Ambulatory Visit | Attending: Internal Medicine | Admitting: Internal Medicine

## 2016-09-02 DIAGNOSIS — Z7901 Long term (current) use of anticoagulants: Secondary | ICD-10-CM | POA: Diagnosis not present

## 2016-09-02 DIAGNOSIS — I4891 Unspecified atrial fibrillation: Secondary | ICD-10-CM | POA: Diagnosis not present

## 2016-09-02 DIAGNOSIS — L97509 Non-pressure chronic ulcer of other part of unspecified foot with unspecified severity: Principal | ICD-10-CM

## 2016-09-02 DIAGNOSIS — S91301A Unspecified open wound, right foot, initial encounter: Secondary | ICD-10-CM | POA: Insufficient documentation

## 2016-09-02 DIAGNOSIS — T8189XD Other complications of procedures, not elsewhere classified, subsequent encounter: Secondary | ICD-10-CM | POA: Diagnosis not present

## 2016-09-02 DIAGNOSIS — E11621 Type 2 diabetes mellitus with foot ulcer: Secondary | ICD-10-CM

## 2016-09-02 DIAGNOSIS — E119 Type 2 diabetes mellitus without complications: Secondary | ICD-10-CM | POA: Diagnosis not present

## 2016-09-02 DIAGNOSIS — E1169 Type 2 diabetes mellitus with other specified complication: Principal | ICD-10-CM

## 2016-09-02 DIAGNOSIS — L97414 Non-pressure chronic ulcer of right heel and midfoot with necrosis of bone: Secondary | ICD-10-CM

## 2016-09-02 DIAGNOSIS — M869 Osteomyelitis, unspecified: Principal | ICD-10-CM

## 2016-09-02 DIAGNOSIS — I2510 Atherosclerotic heart disease of native coronary artery without angina pectoris: Secondary | ICD-10-CM | POA: Diagnosis not present

## 2016-09-02 DIAGNOSIS — I10 Essential (primary) hypertension: Secondary | ICD-10-CM | POA: Diagnosis not present

## 2016-09-02 NOTE — Progress Notes (Signed)
JACQUES, FIFE ([id number]) Visit Report for 09/01/2016 Allergy List Details Patient Name: NERO, SAWATZKY. Date of Service: 09/01/2016 2:30 PM Medical Record Number: [id number] Patient Account Number: [id number] Date of Birth/Sex: [birth year] (82 y.o. Male) Treating RN: Montey Hora Primary Care Hara Milholland: [facility], [place] Other Clinician: Referring Rogelio Winbush: Caprice Beaver Treating Raylynn Hersh/Extender: Ricard Dillon Weeks in Treatment: 0 Allergies Active Allergies penicillin Allergy Notes Electronic Signature(s) Signed: 09/01/2016 5:22:44 PM By: Montey Hora Entered By: Montey Hora on 09/01/2016 14:45:47 [name] ([id number]) -------------------------------------------------------------------------------- Arrival Information Details Patient Name: BOSCO, PAPARELLA. Date of Service: 09/01/2016 2:30 PM Medical Record Number: [id number] Patient Account Number: [id number] Date of Birth/Sex: Oct 19, 1934 (82 y.o. Male) Treating RN: Montey Hora Primary Care Angelie Kram: [facility], [place] Other Clinician: Referring Jaid Quirion: Caprice Beaver Treating Rayya Yagi/Extender: Tito Dine in Treatment: 0 Visit Information Patient Arrived: Walker Arrival Time: 14:32 Accompanied By: spouse Transfer Assistance: None Patient Identification Verified: Yes Secondary Verification Process Yes Completed: Patient Has Alerts: Yes Patient Alerts: Patient on Blood Thinner warfarin DMII Electronic Signature(s) Signed: 09/01/2016 5:22:44 PM By: Montey Hora Entered By: Montey Hora on 09/01/2016 14:32:51 [name] ([id number]) -------------------------------------------------------------------------------- Clinic Level of Care Assessment Details Patient Name: [name] Date of Service: 09/01/2016 2:30 PM Medical Record Number: [id number] Patient Account Number: [id number] Date of Birth/Sex: 12-15-1934 (82 y.o. Male) Treating RN: Montey Hora Primary Care Maigan Bittinger:  [facility], [place] Other Clinician: Referring Grace Haggart: Caprice Beaver Treating Emmauel Hallums/Extender: Tito Dine in Treatment: 0 Clinic Level of Care Assessment Items TOOL 2 Quantity Score []  - Use when only an EandM is performed on the INITIAL visit 0 ASSESSMENTS - Nursing Assessment / Reassessment X - General Physical Exam (combine w/ comprehensive assessment (listed just 1 20 below) when performed on new pt. evals) X - Comprehensive Assessment (HX, ROS, Risk Assessments, Wounds Hx, etc.) 1 25 ASSESSMENTS - Wound and Skin Assessment / Reassessment X - Simple Wound Assessment / Reassessment - one wound 1 5 []  - Complex Wound Assessment / Reassessment - multiple wounds 0 []  - Dermatologic / Skin Assessment (not related to wound area) 0 ASSESSMENTS - Ostomy and/or Continence Assessment and Care []  - Incontinence Assessment and Management 0 []  - Ostomy Care Assessment and Management (repouching, etc.) 0 PROCESS - Coordination of Care X - Simple Patient / Family Education for ongoing care 1 15 []  - Complex (extensive) Patient / Family Education for ongoing care 0 []  - Staff obtains Programmer, systems, Records, Test Results / Process Orders 0 []  - Staff telephones HHA, Nursing Homes / Clarify orders / etc 0 []  - Routine Transfer to another Facility (non-emergent condition) 0 []  - Routine Hospital Admission (non-emergent condition) 0 []  - New Admissions / Biomedical engineer / Ordering NPWT, Apligraf, etc. 0 []  - Emergency Hospital Admission (emergent condition) 0 X - Simple Discharge Coordination 1 10 AERO, DRUMMONDS. ([id number]) []  - Complex (extensive) Discharge Coordination 0 PROCESS - Special Needs []  - Pediatric / Minor Patient Management 0 []  - Isolation Patient Management 0 []  - Hearing / Language / Visual special needs 0 []  - Assessment of Community assistance (transportation, D/C planning, etc.) 0 []  - Additional assistance / Altered mentation 0 []  - Support Surface(s)  Assessment (bed, cushion, seat, etc.) 0 INTERVENTIONS - Wound Cleansing / Measurement X - Wound Imaging (photographs - any number of wounds) 1 5 []  - Wound Tracing (instead of photographs) 0 X - Simple Wound Measurement - one wound 1 5 []  - Complex Wound Measurement - multiple wounds 0 X -  Simple Wound Cleansing - one wound 1 5 []  - Complex Wound Cleansing - multiple wounds 0 INTERVENTIONS - Wound Dressings X - Small Wound Dressing one or multiple wounds 1 10 []  - Medium Wound Dressing one or multiple wounds 0 []  - Large Wound Dressing one or multiple wounds 0 []  - Application of Medications - injection 0 INTERVENTIONS - Miscellaneous []  - External ear exam 0 X - Specimen Collection (cultures, biopsies, blood, body fluids, etc.) 1 5 []  - Specimen(s) / Culture(s) sent or taken to Lab for analysis 0 []  - Patient Transfer (multiple staff / Harrel Lemon Lift / Similar devices) 0 []  - Simple Staple / Suture removal (25 or less) 0 []  - Complex Staple / Suture removal (26 or more) 0 Duma, VERYL WINEMILLER. ([id number]) []  - Hypo / Hyperglycemic Management (close monitor of Blood Glucose) 0 X - Ankle / Brachial Index (ABI) - do not check if billed separately 1 15 Has the patient been seen at the hospital within the last three years: Yes Total Score: 120 Level Of Care: New/Established - Level 4 Electronic Signature(s) Signed: 09/01/2016 5:22:44 PM By: Montey Hora Entered By: Montey Hora on 09/01/2016 16:21:44 [name] ([id number]) -------------------------------------------------------------------------------- Encounter Discharge Information Details Patient Name: SHAQUILLE, JANES. Date of Service: 09/01/2016 2:30 PM Medical Record Number: [id number] Patient Account Number: [id number] Date of Birth/Sex: 1935-01-15 (82 y.o. Male) Treating RN: Montey Hora Primary Care Tiarna Koppen: [facility], [place] Other Clinician: Referring Fallan Mccarey: Caprice Beaver Treating Nicklos Gaxiola/Extender: Tito Dine in Treatment: 0 Encounter Discharge Information Items Discharge Pain Level: 0 Discharge Condition: Stable Ambulatory Status: Walker Discharge Destination: Home Transportation: Private Auto Accompanied By: spouse Schedule Follow-up Appointment: Yes Medication Reconciliation completed No and provided to Patient/Care Jasmine Maceachern: Provided on Clinical Summary of Care: 09/01/2016 Form Type Recipient Paper Patient FW Electronic Signature(s) Signed: 09/01/2016 4:25:32 PM By: Montey Hora Previous Signature: 09/01/2016 3:55:24 PM Version By: Ruthine Dose Entered By: Montey Hora on 09/01/2016 16:25:31 [name] ([id number]) -------------------------------------------------------------------------------- Lower Extremity Assessment Details Patient Name: [name]. Date of Service: 09/01/2016 2:30 PM Medical Record Number: [id number] Patient Account Number: [id number] Date of Birth/Sex: 12/08/1934 (82 y.o. Male) Treating RN: Montey Hora Primary Care Khamiyah Grefe: [facility], [place] Other Clinician: Referring Ascencion Coye: Caprice Beaver Treating Heyward Douthit/Extender: Ricard Dillon Weeks in Treatment: 0 Edema Assessment Assessed: [Left: No] [Right: No] Edema: [Left: Ye] [Right: s] Calf Left: Right: Point of Measurement: 38 cm From Medial Instep cm 41.4 cm Ankle Left: Right: Point of Measurement: 12 cm From Medial Instep cm 28.7 cm Vascular Assessment Pulses: Dorsalis Pedis Palpable: [Right:Yes] Doppler Audible: [Right:Yes] Posterior Tibial Palpable: [Right:Yes] Doppler Audible: [Right:Yes] Extremity colors, hair growth, and conditions: Extremity Color: [Right:Hyperpigmented] Hair Growth on Extremity: [Right:No] Temperature of Extremity: [Right:Warm] Capillary Refill: [Right:< 3 seconds] Blood Pressure: Brachial: [Right:122] Dorsalis Pedis: [Left:Dorsalis Pedis: 96] Ankle: Posterior Tibial: [Left:Posterior Tibial: 106] [Right:0.87] Toe Nail Assessment Left:  Right: Thick: Yes Discolored: Yes Deformed: Yes Improper Length and Hygiene: No JUSTYCE, BABY ([id number]) Electronic Signature(s) Signed: 09/01/2016 5:22:44 PM By: Montey Hora Entered By: Montey Hora on 09/01/2016 15:07:04 [name] ([id number]) -------------------------------------------------------------------------------- Multi Wound Chart Details Patient Name: [name]. Date of Service: 09/01/2016 2:30 PM Medical Record Number: [id number] Patient Account Number: [id number] Date of Birth/Sex: Feb 28, 1935 (82 y.o. Male) Treating RN: Montey Hora Primary Care Glenola Wheat: [facility], [place] Other Clinician: Referring Lourdez Mcgahan: Caprice Beaver Treating Shaquille Janes/Extender: Ricard Dillon Weeks in Treatment: 0 Vital Signs Height(in): 72 Pulse(bpm): 81 Weight(lbs): 285 Blood Pressure 125/56 (mmHg): Body Mass  Index(BMI): 39 Temperature(F): 97.7 Respiratory Rate 18 (breaths/min): Photos: [1:No Photos] [N/A:N/A] Wound Location: [1:Right Calcaneus] [N/A:N/A] Wounding Event: [1:Trauma] [N/A:N/A] Primary Etiology: [1:Diabetic Wound/Ulcer of the Lower Extremity] [N/A:N/A] Comorbid History: [1:Cataracts, Arrhythmia, Coronary Artery Disease, Type II Diabetes] [N/A:N/A] Date Acquired: [1:07/13/2016] [N/A:N/A] Weeks of Treatment: [1:0] [N/A:N/A] Wound Status: [1:Open] [N/A:N/A] Measurements L x W x D 1x5.2x1.8 [N/A:N/A] (cm) Area (cm) : [1:4.084] [N/A:N/A] Volume (cm) : [1:7.351] [N/A:N/A] Classification: [1:Grade 1] [N/A:N/A] Exudate Amount: [1:Large] [N/A:N/A] Exudate Type: [1:Serous] [N/A:N/A] Exudate Color: [1:amber] [N/A:N/A] Wound Margin: [1:Flat and Intact] [N/A:N/A] Granulation Amount: [1:Medium (34-66%)] [N/A:N/A] Granulation Quality: [1:Red] [N/A:N/A] Necrotic Amount: [1:Medium (34-66%)] [N/A:N/A] Necrotic Tissue: [1:Eschar, Adherent Slough] [N/A:N/A] Exposed Structures: [1:Fascia: No Fat Layer (Subcutaneous Tissue) Exposed: No Tendon: No Muscle: No]  [N/A:N/A] Joint: No Bone: No Epithelialization: None N/A N/A Periwound Skin Texture: Excoriation: No N/A N/A Induration: No Callus: No Crepitus: No Rash: No Scarring: No Periwound Skin Maceration: Yes N/A N/A Moisture: Dry/Scaly: No Periwound Skin Color: Atrophie Blanche: No N/A N/A Cyanosis: No Ecchymosis: No Erythema: No Hemosiderin Staining: No Mottled: No Pallor: No Rubor: No Temperature: No Abnormality N/A N/A Tenderness on Yes N/A N/A Palpation: Wound Preparation: Ulcer Cleansing: N/A N/A Rinsed/Irrigated with Saline Topical Anesthetic Applied: Other: lidocaine 4% Treatment Notes Electronic Signature(s) Signed: 09/01/2016 5:12:21 PM By: Linton Ham MD Entered By: Linton Ham on 09/01/2016 16:03:45 [name] ([id number]) -------------------------------------------------------------------------------- [place] Details Patient Name: NORI, POLAND. Date of Service: 09/01/2016 2:30 PM Medical Record Number: [id number] Patient Account Number: [id number] Date of Birth/Sex: Jul 31, 1934 (82 y.o. Male) Treating RN: Montey Hora Primary Care Lora Chavers: [facility], [place] Other Clinician: Referring Melony Tenpas: Caprice Beaver Treating Cotina Freedman/Extender: Tito Dine in Treatment: 0 Active Inactive ` Abuse / Safety / Falls / Self Care Management Nursing Diagnoses: Impaired physical mobility Goals: Patient will remain injury free related to falls Date Initiated: 09/01/2016 Target Resolution Date: 11/06/2016 Goal Status: Active Interventions: Assess fall risk on admission and as needed Notes: ` Orientation to the Wound Care Program Nursing Diagnoses: Knowledge deficit related to the wound healing center program Goals: Patient/caregiver will verbalize understanding of the [place] Program Date Initiated: 09/01/2016 Target Resolution Date: 11/06/2016 Goal Status: Active Interventions: Provide education on  orientation to the wound center Notes: ` Wound/Skin Impairment Nursing Diagnoses: Impaired tissue integrity SIM, CHOQUETTE ([id number]) Goals: Ulcer/skin breakdown will have a volume reduction of 30% by week 4 Date Initiated: 09/01/2016 Target Resolution Date: 11/06/2016 Goal Status: Active Ulcer/skin breakdown will have a volume reduction of 50% by week 8 Date Initiated: 09/01/2016 Target Resolution Date: 11/06/2016 Goal Status: Active Ulcer/skin breakdown will have a volume reduction of 80% by week 12 Date Initiated: 09/01/2016 Target Resolution Date: 11/06/2016 Goal Status: Active Ulcer/skin breakdown will heal within 14 weeks Date Initiated: 09/01/2016 Target Resolution Date: 11/06/2016 Goal Status: Active Interventions: Assess patient/caregiver ability to obtain necessary supplies Assess patient/caregiver ability to perform ulcer/skin care regimen upon admission and as needed Assess ulceration(s) every visit Notes: Electronic Signature(s) Signed: 09/01/2016 5:22:44 PM By: Montey Hora Entered By: Montey Hora on 09/01/2016 15:17:37 [name] ([id number]) -------------------------------------------------------------------------------- Pain Assessment Details Patient Name: [name]. Date of Service: 09/01/2016 2:30 PM Medical Record Number: [id number] Patient Account Number: [id number] Date of Birth/Sex: 14-Nov-1934 (82 y.o. Male) Treating RN: Montey Hora Primary Care Albin Duckett: [facility], [place] Other Clinician: Referring Eulas Schweitzer: Caprice Beaver Treating Kariann Wecker/Extender: Ricard Dillon Weeks in Treatment: 0 Active Problems Location of Pain Severity and Description of Pain Patient Has Paino Yes Site Locations  Pain Location: Pain in Ulcers With Dressing Change: Yes Duration of the Pain. Constant / Intermittento Constant Pain Management and Medication Current Pain Management: Notes Topical or injectable lidocaine is offered to patient for acute pain when  surgical debridement is performed. If needed, Patient is instructed to use over the counter pain medication for the following 24-48 hours after debridement. Wound care MDs do not prescribed pain medications. Patient has chronic pain or uncontrolled pain. Patient has been instructed to make an appointment with their Primary Care Physician for pain management. Electronic Signature(s) Signed: 09/01/2016 5:22:44 PM By: Montey Hora Entered By: Montey Hora on 09/01/2016 14:33:11 [name] ([id number]) -------------------------------------------------------------------------------- Patient/Caregiver Education Details Patient Name: JAKE, FUHRMANN. Date of Service: 09/01/2016 2:30 PM Medical Record Number: [id number] Patient Account Number: [id number] Date of Birth/Gender: 11/12/1934 (82 y.o. Male) Treating RN: Montey Hora Primary Care Physician: [facility], [place] Other Clinician: Referring Physician: Caprice Beaver Treating Physician/Extender: Tito Dine in Treatment: 0 Education Assessment Education Provided To: Patient Education Topics Provided Wound/Skin Impairment: Handouts: Other: wound care as ordered Methods: Demonstration, Explain/Verbal Responses: State content correctly Electronic Signature(s) Signed: 09/01/2016 5:22:44 PM By: Montey Hora Entered By: Montey Hora on 09/01/2016 16:26:11 [name] ([id number]) -------------------------------------------------------------------------------- Wound Assessment Details Patient Name: [name]. Date of Service: 09/01/2016 2:30 PM Medical Record Number: [id number] Patient Account Number: [id number] Date of Birth/Sex: September 24, 1934 (82 y.o. Male) Treating RN: Montey Hora Primary Care Dalia Jollie: [facility], [place] Other Clinician: Referring Shalom Ware: Caprice Beaver Treating Hanad Leino/Extender: Ricard Dillon Weeks in Treatment: 0 Wound Status Wound Number: 1 Primary Diabetic Wound/Ulcer of the  Lower Etiology: Extremity Wound Location: Right Calcaneus Wound Open Wounding Event: Trauma Status: Date Acquired: 07/13/2016 Comorbid Cataracts, Arrhythmia, Coronary Weeks Of Treatment: 0 History: Artery Disease, Type II Diabetes Clustered Wound: No Photos Photo Uploaded By: Montey Hora on 09/01/2016 16:27:57 Wound Measurements Length: (cm) 1 Width: (cm) 5.2 Depth: (cm) 1.8 Area: (cm) 4.084 Volume: (cm) 7.351 % Reduction in Area: % Reduction in Volume: Epithelialization: None Tunneling: No Undermining: No Wound Description Classification: Grade 1 Wound Margin: Flat and Intact Exudate Amount: Large Exudate Type: Serous Exudate Color: amber Foul Odor After Cleansing: No Slough/Fibrino Yes Wound Bed Granulation Amount: Medium (34-66%) Exposed Structure Granulation Quality: Red Fascia Exposed: No Necrotic Amount: Medium (34-66%) Fat Layer (Subcutaneous Tissue) Exposed: No Necrotic Quality: Eschar, Adherent Slough Tendon Exposed: No KAZIM, CORRALES ([id number]) Muscle Exposed: No Joint Exposed: No Bone Exposed: No Periwound Skin Texture Texture Color No Abnormalities Noted: No No Abnormalities Noted: No Callus: No Atrophie Blanche: No Crepitus: No Cyanosis: No Excoriation: No Ecchymosis: No Induration: No Erythema: No Rash: No Hemosiderin Staining: No Scarring: No Mottled: No Pallor: No Moisture Rubor: No No Abnormalities Noted: No Dry / Scaly: No Temperature / Pain Maceration: Yes Temperature: No Abnormality Tenderness on Palpation: Yes Wound Preparation Ulcer Cleansing: Rinsed/Irrigated with Saline Topical Anesthetic Applied: Other: lidocaine 4%, Treatment Notes Wound #1 (Right Calcaneus) 1. Cleansed with: Clean wound with Normal Saline 2. Anesthetic Topical Lidocaine 4% cream to wound bed prior to debridement 4. Dressing Applied: Aquacel Ag Other dressing (specify in notes) 5. Secondary Dressing Applied Foam Gauze and  Kerlix/Conform 7. Secured with Recruitment consultant) Signed: 09/01/2016 5:22:44 PM By: Montey Hora Entered By: Montey Hora on 09/01/2016 14:55:15 [name] ([id number]) -------------------------------------------------------------------------------- [place] Details Patient Name: JONANTHONY, NAHAR. Date of Service: 09/01/2016 2:30 PM Medical Record Number: [id number] Patient Account Number: [id number] Date of Birth/Sex: [birth year] (82 y.o. Male) Treating RN: Montey Hora Primary Care  Zuleyka Kloc: [facility], [place] Other Clinician: Referring Recardo Linn: Caprice Beaver Treating Hilary Milks/Extender: Tito Dine in Treatment: 0 Vital Signs Time Taken: 14:33 Temperature (F): 97.7 Height (in): 72 Pulse (bpm): 81 Source: Measured Respiratory Rate (breaths/min): 18 Weight (lbs): 285 Blood Pressure (mmHg): 125/56 Source: Measured Reference Range: 80 - 120 mg / dl Body Mass Index (BMI): 38.6 Electronic Signature(s) Signed: 09/01/2016 5:22:44 PM By: Montey Hora Entered By: Montey Hora on 09/01/2016 14:35:49

## 2016-09-02 NOTE — Progress Notes (Signed)
[name] [name] ([id number]) Visit Report for 09/01/2016 Abuse/Suicide Risk Screen Details Patient Name: [name] [name], [name] [name]. Date of Service: 09/01/2016 2:30 PM Medical Record Number: [id number] Patient Account Number: [id number] Date of Birth/Sex: 05-22-1934 (82 y.o. Male) Treating RN: Montey Hora Primary Care Ja Pistole: [facility], [place] Other Clinician: Referring Gwenneth Whiteman: Caprice Beaver Treating Rasheka Denard/Extender: Ricard Dillon Weeks in Treatment: 0 Abuse/Suicide Risk Screen Items Answer ABUSE/SUICIDE RISK SCREEN: Has anyone close to you tried to hurt or harm you recentlyo No Do you feel uncomfortable with anyone in your familyo No Has anyone forced you do things that you didnot want to doo No Do you have any thoughts of harming yourselfo No Patient displays signs or symptoms of abuse and/or neglect. No Electronic Signature(s) Signed: 09/01/2016 5:22:44 PM By: Montey Hora Entered By: Montey Hora on 09/01/2016 14:37:10 [name] [name] ([id number]) -------------------------------------------------------------------------------- Activities of Daily Living Details Patient Name: [name] [name]. Date of Service: 09/01/2016 2:30 PM Medical Record Number: [id number] Patient Account Number: [id number] Date of Birth/Sex: 06/02/1934 (82 y.o. Male) Treating RN: Montey Hora Primary Care Severino Paolo: [facility], [place] Other Clinician: Referring Serah Nicoletti: Caprice Beaver Treating Keyonia Gluth/Extender: Ricard Dillon Weeks in Treatment: 0 Activities of Daily Living Items Answer Activities of Daily Living (Please select one for each item) Drive Automobile Not Able Take Medications Completely Able Use Telephone Completely Able Care for Appearance Completely Able Use Toilet Need Assistance Bath / Shower Need Assistance Dress Self Completely Able Feed Self Completely Able Walk Need Assistance Get In / Out Bed Completely [place] for Self Need Assistance Electronic Signature(s) Signed: 09/01/2016 5:22:44 PM By: Montey Hora Entered By: Montey Hora on 09/01/2016 14:37:59 [name] [name] ([id number]) -------------------------------------------------------------------------------- Education Assessment Details Patient Name: [name] [name] Date of Service: 09/01/2016 2:30 PM Medical Record Number: [id number] Patient Account Number: [id number] Date of Birth/Sex: [birth year] (82 y.o. Male) Treating RN: Montey Hora Primary Care Channah Godeaux: [facility], [place] Other Clinician: Referring Zelda Reames: Caprice Beaver Treating Jayse Hodkinson/Extender: Tito Dine in Treatment: 0 Primary Learner Assessed: Caregiver Reason Patient is not Primary Learner: wound location Learning Preferences/Education Level/Primary Language Learning Preference: Explanation, Demonstration Highest Education Level: College or Above Preferred Language: English Cognitive Barrier Assessment/Beliefs Language Barrier: No Translator Needed: No Memory Deficit: No Emotional Barrier: No Cultural/Religious Beliefs Affecting Medical No Care: Physical Barrier Assessment Impaired Vision: No Impaired Hearing: No Decreased Hand dexterity: No Knowledge/Comprehension Assessment Knowledge Level: Medium Comprehension Level: Medium Ability to understand written Medium instructions: Ability to understand verbal Medium instructions: Motivation Assessment Anxiety Level: Calm Cooperation: Cooperative Education Importance: Acknowledges Need Interest in Health Problems: Asks Questions Perception: Coherent Willingness to Engage in Self- Medium Management Activities: Readiness to Engage in Self- Medium Management Activities: EZECHIEL, [place] ([id number]) Electronic Signature(s) Signed: 09/01/2016 5:22:44 PM By: Montey Hora Entered By: Montey Hora on 09/01/2016 14:38:24 CLAUDE, WALDMAN  ([id number]) -------------------------------------------------------------------------------- Fall Risk Assessment Details Patient Name: [name] [name]. Date of Service: 09/01/2016 2:30 PM Medical Record Number: [id number] Patient Account Number: [id number] Date of Birth/Sex: Apr 22, 1934 (82 y.o. Male) Treating RN: Montey Hora Primary Care Roshan Salamon: [facility], [place] Other Clinician: Referring Tnya Ades: Caprice Beaver Treating Kjerstin Abrigo/Extender: Ricard Dillon Weeks in Treatment: 0 Fall Risk Assessment Items Have you had 2 or more falls in the last 12 monthso 0 No Have you had any fall that resulted in injury in the last 12 monthso 0 No FALL RISK ASSESSMENT: History of falling - immediate or within 3 months 0 No Secondary diagnosis  0 No Ambulatory aid None/bed rest/wheelchair/nurse 0 No Crutches/cane/walker 15 Yes Furniture 0 No IV Access/Saline Lock 0 No Gait/Training Normal/bed rest/immobile 0 No Weak 10 Yes Impaired 0 No Mental Status Oriented to own ability 0 Yes Electronic Signature(s) Signed: 09/01/2016 5:22:44 PM By: Montey Hora Entered By: Montey Hora on 09/01/2016 14:38:34 [name] [name] ([id number]) -------------------------------------------------------------------------------- Nutrition Risk Assessment Details Patient Name: [name] [name]. Date of Service: 09/01/2016 2:30 PM Medical Record Number: [id number] Patient Account Number: [id number] Date of Birth/Sex: [birth year] (82 y.o. Male) Treating RN: Montey Hora Primary Care Yides Saidi: [facility], [place] Other Clinician: Referring Veyda Kaufman: Caprice Beaver Treating Kate Larock/Extender: Ricard Dillon Weeks in Treatment: 0 Height (in): 72 Weight (lbs): 285 Body Mass Index (BMI): 38.6 Nutrition Risk Assessment Items NUTRITION RISK SCREEN: I have an illness or condition that made me change the kind and/or 0 No amount of food I eat I eat fewer than two meals per day 0 No I eat few fruits and vegetables,  or milk products 0 No I have three or more drinks of beer, liquor or wine almost every day 0 No I have tooth or mouth problems that make it hard for me to eat 0 No I don't always have enough money to buy the food I need 0 No I eat alone most of the time 0 No I take three or more different prescribed or over-the-counter drugs a 1 Yes day Without wanting to, I have lost or gained 10 pounds in the last six 0 No months I am not always physically able to shop, cook and/or feed myself 0 No Nutrition Protocols Good Risk Protocol 0 No interventions needed Moderate Risk Protocol Electronic Signature(s) Signed: 09/01/2016 5:22:44 PM By: Montey Hora Entered By: Montey Hora on 09/01/2016 14:38:41

## 2016-09-02 NOTE — Progress Notes (Signed)
JERRET, MCBANE ([id number]) Visit Report for 09/01/2016 Chief Complaint Document Details Patient Name: [name] [name], [name] [name]. Date of Service: 09/01/2016 2:30 PM Medical Record Number: [id number] Patient Account Number: [id number] Date of Birth/Sex: [birth year] (82 y.o. Male) Treating RN: Montey Hora Primary Care Provider: [facility], [place] Other Clinician: Referring Provider: Caprice Beaver Treating Provider/Extender: Ricard Dillon Weeks in Treatment: 0 Information Obtained from: Patient Chief Complaint 09/01/16; patient is here for review of wounds on his right plantar heel in the setting of type 2 diabetes Electronic Signature(s) Signed: 09/01/2016 5:12:21 PM By: Linton Ham MD Entered By: Linton Ham on 09/01/2016 [place], Mas[place] ([id number]) -------------------------------------------------------------------------------- HPI Details Patient Name: [name] [name], [name] [name]. Date of Service: 09/01/2016 2:30 PM Medical Record Number: [id number] Patient Account Number: [id number] Date of Birth/Sex: [birth year] (82 y.o. Male) Treating RN: Montey Hora Primary Care Provider: [facility], [place] Other Clinician: Referring Provider: Caprice Beaver Treating Provider/Extender: Ricard Dillon Weeks in Treatment: 0 History of Present Illness HPI Description: 09/01/16 this is a pleasant 82 year old man who arrives in clinic accompanied by his wife. They live in [place]. He is a type II diabetic with last hemoglobin A1c of 7.7 in March/18. His problem in his right heel began early in the month. He apparently was pushing off on a reclining chair brought his heel down and suffered some form of puncture wound in the tip of his right heel. He was eventually referred to Dr. Caprice Beaver. I am not sure of the exact date of this however he had an MRI of the ankle on 08/03/16 which did not show osteomyelitis of the right ankle there was a small soft tissue wound on the posterior aspect of the  calcaneus without focal fluid collection this suggest an abscess. Nevertheless this did not improve and he went to the OR with Dr. Caprice Beaver on 08/12/16 for operative debridement. Her Dorothyann Peng was also given a cam walking boot to protect the heel although the patient found this too uncomfortable. 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 Apparently x-rays have been done of the heel that did not show underlying problems according to the patient. Cultures of the heel were apparently also done although I don't see these in Epic. Apparently a "blood blister" was noted by the patient's wife late last week. The patient saw Dr. Caprice Beaver and ciprofloxacin was prescribed. He has enough ciprofloxacin until this Friday. The patient is uncomfortable but not systemically unwell. Electronic Signature(s) Signed: 09/01/2016 5:12:21 PM By: Linton Ham MD Entered By: Linton Ham on 09/01/2016 16:14:59 [name] [name] ([id number]) -------------------------------------------------------------------------------- Physical Exam Details Patient Name: [name] [name], [name] [name]. Date of Service: 09/01/2016 2:30 PM Medical Record Number: [id number] Patient Account Number: [id number] Date of Birth/Sex: 07-18-1934 (82 y.o. Male) Treating RN: Montey Hora Primary Care Provider: [facility], [place] Other Clinician: Referring Provider: Caprice Beaver Treating Provider/Extender: Ricard Dillon Weeks in Treatment: 0 Constitutional Sitting or standing Blood Pressure is within target range for patient.. Pulse regular and within target range for patient.Marland Kitchen Respirations regular, non-labored and within target range.. Temperature is normal and within the target range for the patient.Marland Kitchen appears in no distress. Respiratory Respiratory effort is easy and symmetric bilaterally. Rate is normal at rest and on room air.. Bilateral breath sounds are clear  and equal in all lobes with no wheezes, rales or rhonchi.. Cardiovascular Heart rhythm and rate regular, without murmur or gallop.. Pedal pulses palpable and  strong bilaterally.. Signs of venous insufficiency mild edema. Lymphatic None palpable the right popliteal area. Integumentary (Hair, Skin) No rash. Notes Wound exam; the patient's original wound's on the tip of his right calcaneus. This has some surrounding maceration but the base of the wound actually doesn't look to bad. However the area that came up on the medial heel last week probes right to bone. There is. Limit material coming out of this and a mild-to- moderate amount. Specimen obtained for culture. There is also surrounding erythema and the heel and palpable tenderness without crepitus. Electronic Signature(s) Signed: 09/01/2016 5:12:21 PM By: Linton Ham MD Entered By: Linton Ham on 09/01/2016 16:17:06 [name] [name] ([id number]) -------------------------------------------------------------------------------- Physician Orders Details Patient Name: [name] [name], [name] [name]. Date of Service: 09/01/2016 2:30 PM Medical Record Number: [id number] Patient Account Number: [id number] Date of Birth/Sex: [birth year] (82 y.o. Male) Treating RN: Montey Hora Primary Care Provider: [facility], [place] Other Clinician: Referring Provider: Caprice Beaver Treating Provider/Extender: Tito Dine in Treatment: 0 Verbal / Phone Orders: No Diagnosis Coding Wound Cleansing Wound #1 Right Calcaneus o Clean wound with Normal Saline. Anesthetic Wound #1 Right Calcaneus o Topical Lidocaine 4% cream applied to wound bed prior to debridement Primary Wound Dressing Wound #1 Right Calcaneus o Aquacel Ag Secondary Dressing Wound #1 Right Calcaneus o Gauze and Kerlix/Conform o Foam - heel cup o Drawtex Dressing Change Frequency Wound #1 Right Calcaneus o Change dressing every day. Follow-up Appointments Wound #1  Right Calcaneus o Return Appointment in 1 week. Edema Control Wound #1 Right Calcaneus o Elevate legs to the level of the heart and pump ankles as often as possible Additional Orders / Instructions Wound #1 Right Calcaneus o Increase protein intake. o Other: - Please add vitamin A, vitamin C and zinc supplements to your diet [name] [name], [name] [name] ([id number]) Greasy #1 Right [place] Nurse may visit PRN to address patientos wound care needs. o FACE TO FACE ENCOUNTER: MEDICARE and MEDICAID PATIENTS: I certify that this patient is under my care and that I had a face-to-face encounter that meets the physician face-to-face encounter requirements with this patient on this date. The encounter with the patient was in whole or in part for the following MEDICAL CONDITION: (primary reason for [place]) MEDICAL NECESSITY: I certify, that based on my findings, NURSING services are a medically necessary home health service. HOME BOUND STATUS: I certify that my clinical findings support that this patient is homebound (i.e., Due to illness or injury, pt requires aid of supportive devices such as crutches, cane, wheelchairs, walkers, the use of special transportation or the assistance of another person to leave their place of residence. There is a normal inability to leave the home and doing so requires considerable and taxing effort. Other absences are for medical reasons / religious services and are infrequent or of short duration when for other reasons). o If current dressing causes regression in wound condition, may D/C ordered dressing product/s and apply Normal Saline Moist Dressing daily until next [place] / Other MD appointment. [place] of regression in wound condition at [phone]. o Please direct any NON-WOUND related issues/requests for orders to patient's Primary  Care Physician Medications-please add to medication list. Wound #1 Right Calcaneus o P.O. Antibiotics Laboratory o Bacteria identified in Wound by Culture (MICRO) - right calcaneous oooo LOINC Code: 4196-2 oooo Convenience Name: Wound culture routine Radiology o MRI, lower extremity with contast -  right calcaneous Services and Therapies o Arterial Studies- Bilateral Patient Medications Allergies: penicillin Notifications Medication Indication Start End clindamycin HCl in addition to 09/01/2016 cipro/diabetic foot infection DOSE qid - oral 300 mg capsule - qid capsule oral [name] [name], [name] [name] ([id number]) Electronic Signature(s) Signed: 09/01/2016 4:00:00 PM By: Linton Ham MD Entered By: Linton Ham on 09/01/2016 15:59:59 [name] [name] ([id number]) -------------------------------------------------------------------------------- Problem List Details Patient Name: RHEN, KAWECKI. Date of Service: 09/01/2016 2:30 PM Medical Record Number: [id number] Patient Account Number: [id number] Date of Birth/Sex: 1934/04/16 (82 y.o. 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 Date of Service: 09/01/2016 2:30 PM Medical Record Number: [id number] Patient Account Number: [id number] Date of Birth/Sex: 03-04-1935 (82 y.o. Male) Treating RN: Montey Hora Primary Care Provider: [facility], [place] Other Clinician: Referring Provider: Caprice Beaver Treating Provider/Extender: Ricard Dillon Weeks in Treatment: 0 Subjective Chief Complaint Information obtained from Patient 09/01/16; patient is here for review of wounds on his right plantar heel in the setting of type 2 diabetes History of Present Illness (HPI) 09/01/16 this is a pleasant 82 year old man who arrives in clinic accompanied by his wife. They live in [place]. He is a type II diabetic with last hemoglobin A1c of 7.7 in March/18. His problem in his right heel began early in the month. He apparently was pushing off on a reclining chair brought his heel down and suffered some form of puncture wound in the tip of his right heel. He was eventually referred to Dr. Caprice Beaver. I am not sure of the exact date of this however he had an MRI of the ankle on 08/03/16 which did not show osteomyelitis of the right ankle there was a small soft tissue wound on the posterior aspect of the calcaneus without focal fluid collection this suggest an abscess. Nevertheless this did not improve and he went to the OR with Dr. Caprice Beaver on 08/12/16 for operative debridement. Her Dorothyann Peng was also given a cam walking boot to protect the heel although the patient found this too uncomfortable. 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 Apparently x-rays have been done of the heel that did not show underlying problems according to the patient. Cultures of the heel were apparently also done although I don't  see these in Epic. Apparently a "blood blister" was noted by the patient's wife late last week. The patient saw Dr. Caprice Beaver and ciprofloxacin was prescribed. He has enough ciprofloxacin until this Friday. The patient is uncomfortable but not systemically unwell. Wound History Patient presents with 1 open wound that has been present for approximately April 2018. Patient has been treating wound in the following manner: collagen. Laboratory tests have been performed in the last month. Patient reportedly has not tested positive for an antibiotic resistant organism. Patient reportedly has  not tested positive for osteomyelitis. Patient reportedly has had testing performed to evaluate circulation in the legs. Patient experiences the following problems associated with their wounds: infection, swelling. Patient History Information obtained from Patient. Allergies penicillin Social History KALOB, BERGEN ([id number]) Former smoker - smoked 50+ years ago, Marital Status - Married, Alcohol Use - Never, Drug Use - No History, Caffeine Use - Never. Medical History Eyes Patient has history of Cataracts - removed both eyes Denies history of Glaucoma, Optic Neuritis Ear/Nose/Mouth/Throat Denies history of Chronic sinus problems/congestion, Middle ear problems Hematologic/Lymphatic Denies history of Anemia, Hemophilia, Human Immunodeficiency Virus, Lymphedema, Sickle Cell Disease Respiratory Denies history of Aspiration, Asthma, Chronic Obstructive Pulmonary Disease (COPD), Pneumothorax, Sleep Apnea, Tuberculosis Cardiovascular Patient has history of Arrhythmia - a fib, Coronary Artery Disease Denies history of Angina, Congestive Heart Failure, Deep Vein Thrombosis, Hypertension, Hypotension, Myocardial Infarction, Peripheral Arterial Disease, Peripheral Venous Disease, Phlebitis, Vasculitis Gastrointestinal Denies history of Cirrhosis , Colitis, Crohn s, Hepatitis A, Hepatitis B, Hepatitis  C Endocrine Patient has history of Type II Diabetes Immunological Denies history of Lupus Erythematosus, Raynaud s, Scleroderma Integumentary (Skin) Denies history of History of Burn, History of pressure wounds Musculoskeletal Denies history of Gout, Rheumatoid Arthritis, Osteoarthritis, Osteomyelitis Neurologic Denies history of Dementia, Neuropathy, Quadriplegia, Paraplegia, Seizure Disorder Oncologic Denies history of Received Chemotherapy, Received Radiation Medical And Surgical History Notes Genitourinary hematuria 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 Eyes The patient has no complaints or symptoms. Ear/Nose/Mouth/Throat The patient has no complaints or symptoms. Hematologic/Lymphatic The patient has no complaints or symptoms. Respiratory The patient has no complaints or symptoms. Cardiovascular HAZEN, BRUMETT ([id number]) Complains or has symptoms of LE edema. Denies complaints or symptoms of Chest pain. Gastrointestinal The patient has no complaints or symptoms. Endocrine The patient has no complaints or symptoms. Genitourinary The patient has no complaints or symptoms. Immunological The patient has no complaints or symptoms. Integumentary (Skin) The patient has no complaints or symptoms. Musculoskeletal The patient has no complaints or symptoms. Neurologic The patient has no complaints or symptoms. Oncologic The patient has no complaints or symptoms. Psychiatric The patient has no complaints or symptoms. Objective Constitutional Sitting or standing Blood Pressure is within target range for patient.. Pulse regular and within target range for patient.Marland Kitchen Respirations regular, non-labored and within target range.. Temperature is normal and within the target range for the patient.Marland Kitchen appears in no distress. Vitals Time Taken: 2:33 PM, Height: 72 in, Source:  Measured, Weight: 285 lbs, Source: Measured, BMI: 38.6, Temperature: 97.7 F, Pulse: 81 bpm, Respiratory Rate: 18 breaths/min, Blood Pressure: 125/56 mmHg. Respiratory Respiratory effort is easy and symmetric bilaterally. Rate is normal at rest and on room air.. Bilateral breath sounds are clear and equal in all lobes with no wheezes, rales or rhonchi.. Cardiovascular Heart rhythm and rate regular, without murmur or gallop.. Pedal pulses palpable and strong bilaterally.. Signs of venous insufficiency mild edema. Lymphatic None palpable the right popliteal area. PHIL, MICHELS ([id number]) General Notes: Wound exam; the patient's original wound's on the tip of his right calcaneus. This has some surrounding maceration but the base of the wound actually doesn't look to bad. However the area that came up on the medial heel last week probes right to bone. There is. Limit material coming out of this and a mild-to-moderate amount. Specimen obtained for culture. There is also surrounding erythema and the heel and palpable tenderness without crepitus. Integumentary (Hair, Skin) No  rash. Wound #1 status is Open. Original cause of wound was Trauma. The wound is located on the Right Calcaneus. The wound measures 1cm length x 5.2cm width x 1.8cm depth; 4.084cm^2 area and 7.351cm^3 volume. There is no tunneling or undermining noted. There is a large amount of serous drainage noted. The wound margin is flat and intact. There is medium (34-66%) red granulation within the wound bed. There is a medium (34-66%) amount of necrotic tissue within the wound bed including Eschar and Adherent Slough. The periwound skin appearance exhibited: Maceration. The periwound skin appearance did not exhibit: Callus, Crepitus, Excoriation, Induration, Rash, Scarring, Dry/Scaly, Atrophie Blanche, Cyanosis, Ecchymosis, Hemosiderin Staining, Mottled, Pallor, Rubor, Erythema. Periwound temperature was noted as No Abnormality.  The periwound has tenderness on palpation. 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 Anesthetic: Wound #1 Right Calcaneus: Topical Lidocaine 4% cream applied to wound bed prior to debridement Primary Wound Dressing: Wound #1 Right Calcaneus: Aquacel Ag Secondary Dressing: Wound #1 Right Calcaneus: MILIANO, COTTEN. ([id number]) Gauze and Kerlix/Conform Foam - heel cup Drawtex Dressing Change Frequency: Wound #1 Right Calcaneus: Change dressing every day. Follow-up Appointments: Wound #1 Right Calcaneus: Return Appointment in 1 week. Edema Control: Wound #1 Right Calcaneus: Elevate legs to the level of the heart and pump ankles as often as possible Additional Orders / Instructions: Wound #1 Right Calcaneus: Increase protein intake. Other: - Please add vitamin A, vitamin C and zinc supplements to your diet Home Health: Wound #1 Right Calcaneus: Continue Home Health Visits - [facility] Nurse may visit PRN to address patient s wound care needs. FACE TO FACE ENCOUNTER: MEDICARE and MEDICAID PATIENTS: I certify that this patient is under my care and that I had a face-to-face encounter that meets the physician face-to-face encounter requirements with this patient on this date. The encounter with the patient was in whole or in part for the following MEDICAL CONDITION: (primary reason for [place]) MEDICAL NECESSITY: I certify, that based on my findings, NURSING services are a medically necessary home health service. HOME BOUND STATUS: I certify that my clinical findings support that this patient is homebound (i.e., Due to illness or  injury, pt requires aid of supportive devices such as crutches, cane, wheelchairs, walkers, the use of special transportation or the assistance of another person to leave their place of residence. There is a normal inability to leave the home and doing so requires considerable and taxing effort. Other absences are for medical reasons / religious services and are infrequent or of short duration when for other reasons). If current dressing causes regression in wound condition, may D/C ordered dressing product/s and apply Normal Saline Moist Dressing daily until next [place] / Other MD appointment. [place] of regression in wound condition at [phone]. Please direct any NON-WOUND related issues/requests for orders to patient's Primary Care Physician Medications-please add to medication list.: Wound #1 Right Calcaneus: P.O. Antibiotics Laboratory ordered were: Wound culture routine - right calcaneous Radiology ordered were: MRI, lower extremity with contast - right calcaneous Services and Therapies ordered were: Arterial Studies- Bilateral The following medication(s) was prescribed: clindamycin HCl oral 300 mg capsule qid qid capsule oral for in addition to  cipro/diabetic foot infection starting 09/01/2016 MAXSON, ODDO ([id number]) #1 diabetic foot infection involving the patient's right heel with an original wound on the tip of the heel and a new probing area that came up on the medial aspect of the heel last week. The latter probes to bone and as. Went drainage #2 a specimen of the pus coming out of this wound was sent for culture. The patient is allergic to penicillin and is on Coumadin. The options for staph and anaerobic coverage in this setting were limited. I therefore added clindamycin 300 mg 4 times a day to the ciprofloxacin. I will look forward to the test results on Friday so we can tailor the antibiotics #3 MRI with contrast to compare with the  one done on 08/03/16 I would think that the likelihood of underlying osteomyelitis/acute osteomyelitis in this setting is quite high. He had trouble with leg cramps during his last MRI #4 we have provided him with a offloading heel boot. Apparently he would not wear the cam walker. #5 clearly the original wound started with some type of trauma/laceration. Whether there was a daycare not apparently is unclear, none was ever found at operative debridement by Dr. Caprice Beaver is podiatric surgeon #6 the patient had reasonably normal arterial studies in 2014 with an ABI on the right of 1.08 on the left of 1.11 however in our clinic today is ABI was 0.87. Given the seriousness of the underlying infection I think repeat arterial studies are warranted Electronic Signature(s) Signed: 09/01/2016 5:12:21 PM By: Linton Ham MD Entered By: Linton Ham on 09/01/2016 16:20:27 [name] [name], [name] [name] ([id number]) -------------------------------------------------------------------------------- ROS/PFSH Details Patient Name: [name] [name], [name] [name]. Date of Service: 09/01/2016 2:30 PM Medical Record Number: [id number] Patient Account Number: [id number] Date of Birth/Sex: [birth year] (82 y.o. Male) Treating RN: Montey Hora Primary Care Provider: [facility], [place] Other Clinician: Referring Provider: Caprice Beaver Treating Provider/Extender: Ricard Dillon Weeks in Treatment: 0 Information Obtained From Patient Wound History Do you currently have one or more open woundso Yes How many open wounds do you currently haveo 1 Approximately how long have you had your woundso April 2018 How have you been treating your wound(s) until nowo collagen Has your wound(s) ever healed and then re-openedo No Have you had any lab work done in the past montho Yes Who ordered the lab work doneo PCP Have you tested positive for an antibiotic resistant organism (MRSA, VRE)o No Have you tested positive for osteomyelitis (bone  infection)o No Have you had any tests for circulation on your legso Yes Who ordered the testo PCP Where was the test doneo [facility] Have you had other problems associated with your woundso Infection, Swelling Cardiovascular Complaints and Symptoms: Positive for: LE edema Negative for: Chest pain Medical History: Positive for: Arrhythmia - a fib; Coronary Artery Disease Negative for: Angina; Congestive Heart Failure; Deep Vein Thrombosis; Hypertension; Hypotension; Myocardial Infarction; Peripheral Arterial Disease; Peripheral Venous Disease; Phlebitis; Vasculitis Constitutional Symptoms (General Health) Complaints and Symptoms: No Complaints or Symptoms Eyes Complaints and Symptoms: No Complaints or Symptoms Medical History: Positive for: Cataracts - removed both eyes Negative for: Glaucoma; Optic Neuritis [name] [name], [name] [name] ([id number]) Ear/Nose/Mouth/Throat Complaints and Symptoms: No Complaints or Symptoms Medical History: Negative for: Chronic sinus problems/congestion; Middle ear problems Hematologic/Lymphatic Complaints and Symptoms: No Complaints or Symptoms Medical History: Negative for: Anemia; Hemophilia; Human Immunodeficiency Virus; Lymphedema; Sickle Cell Disease Respiratory Complaints and Symptoms: No Complaints or Symptoms Medical History: Negative for: Aspiration; Asthma; Chronic Obstructive Pulmonary Disease (COPD); Pneumothorax; Sleep  Apnea; Tuberculosis Gastrointestinal Complaints and Symptoms: No Complaints or Symptoms Medical History: Negative for: Cirrhosis ; Colitis; Crohnos; Hepatitis A; Hepatitis B; Hepatitis C Endocrine Complaints and Symptoms: No Complaints or Symptoms Medical History: Positive for: Type II Diabetes Genitourinary Complaints and Symptoms: No Complaints or Symptoms Medical History: Past Medical History Notes: hematuria Immunological [name] [name], [name] [name]. ([id number]) Complaints and Symptoms: No Complaints or Symptoms Medical  History: Negative for: Lupus Erythematosus; Raynaudos; Scleroderma Integumentary (Skin) Complaints and Symptoms: No Complaints or Symptoms Medical History: Negative for: History of Burn; History of pressure wounds Musculoskeletal Complaints and Symptoms: No Complaints or Symptoms Medical History: Negative for: Gout; Rheumatoid Arthritis; Osteoarthritis; Osteomyelitis Neurologic Complaints and Symptoms: No Complaints or Symptoms Medical History: Negative for: Dementia; Neuropathy; Quadriplegia; Paraplegia; Seizure Disorder Oncologic Complaints and Symptoms: No Complaints or Symptoms Medical History: Negative for: Received Chemotherapy; Received Radiation Past Medical History Notes: CLL - no treatment Psychiatric Complaints and Symptoms: No Complaints or Symptoms HBO Extended History Items Eyes: Cataracts Immunizations Pneumococcal Vaccine: [name] [name], GRIGGS ([id number]) Received Pneumococcal Vaccination: Yes Immunization Notes: up to date Family and Social History Former smoker - smoked 50+ years ago; Marital Status - Married; Alcohol Use: Never; Drug Use: No History; Caffeine Use: Never; Financial Concerns: No; Food, Clothing or Shelter Needs: No; Support System Lacking: No; Transportation Concerns: No; Advanced Directives: Yes; Living Will: Yes; [place]: Yes - spouse - Riki Altes McFatter Electronic Signature(s) Signed: 09/01/2016 5:12:21 PM By: Linton Ham MD Signed: 09/01/2016 5:22:44 PM By: Montey Hora Entered By: Montey Hora on 09/01/2016 14:45:24 [name] [name] ([id number]) -------------------------------------------------------------------------------- [place] Details Patient Name: GRAM, SIEDLECKI. Date of Service: 09/01/2016 Medical Record Number: [id number] Patient Account Number: [id number] Date of Birth/Sex: 09-21-1934 (82 y.o. 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

## 2016-09-03 ENCOUNTER — Other Ambulatory Visit: Payer: Self-pay | Admitting: Internal Medicine

## 2016-09-03 DIAGNOSIS — L97511 Non-pressure chronic ulcer of other part of right foot limited to breakdown of skin: Principal | ICD-10-CM

## 2016-09-03 DIAGNOSIS — E08621 Diabetes mellitus due to underlying condition with foot ulcer: Secondary | ICD-10-CM

## 2016-09-04 DIAGNOSIS — T8189XD Other complications of procedures, not elsewhere classified, subsequent encounter: Secondary | ICD-10-CM | POA: Diagnosis not present

## 2016-09-04 DIAGNOSIS — I10 Essential (primary) hypertension: Secondary | ICD-10-CM | POA: Diagnosis not present

## 2016-09-04 DIAGNOSIS — I4891 Unspecified atrial fibrillation: Secondary | ICD-10-CM | POA: Diagnosis not present

## 2016-09-04 DIAGNOSIS — I2510 Atherosclerotic heart disease of native coronary artery without angina pectoris: Secondary | ICD-10-CM | POA: Diagnosis not present

## 2016-09-04 DIAGNOSIS — Z7901 Long term (current) use of anticoagulants: Secondary | ICD-10-CM | POA: Diagnosis not present

## 2016-09-04 DIAGNOSIS — E119 Type 2 diabetes mellitus without complications: Secondary | ICD-10-CM | POA: Diagnosis not present

## 2016-09-04 LAB — AEROBIC CULTURE W GRAM STAIN (SUPERFICIAL SPECIMEN)

## 2016-09-04 LAB — AEROBIC CULTURE  (SUPERFICIAL SPECIMEN): CULTURE: NORMAL

## 2016-09-05 DIAGNOSIS — I10 Essential (primary) hypertension: Secondary | ICD-10-CM | POA: Diagnosis not present

## 2016-09-05 DIAGNOSIS — T8189XD Other complications of procedures, not elsewhere classified, subsequent encounter: Secondary | ICD-10-CM | POA: Diagnosis not present

## 2016-09-05 DIAGNOSIS — I4891 Unspecified atrial fibrillation: Secondary | ICD-10-CM | POA: Diagnosis not present

## 2016-09-05 DIAGNOSIS — Z7901 Long term (current) use of anticoagulants: Secondary | ICD-10-CM | POA: Diagnosis not present

## 2016-09-05 DIAGNOSIS — I2510 Atherosclerotic heart disease of native coronary artery without angina pectoris: Secondary | ICD-10-CM | POA: Diagnosis not present

## 2016-09-05 DIAGNOSIS — E119 Type 2 diabetes mellitus without complications: Secondary | ICD-10-CM | POA: Diagnosis not present

## 2016-09-06 DIAGNOSIS — I2510 Atherosclerotic heart disease of native coronary artery without angina pectoris: Secondary | ICD-10-CM | POA: Diagnosis not present

## 2016-09-06 DIAGNOSIS — I4891 Unspecified atrial fibrillation: Secondary | ICD-10-CM | POA: Diagnosis not present

## 2016-09-06 DIAGNOSIS — T8189XD Other complications of procedures, not elsewhere classified, subsequent encounter: Secondary | ICD-10-CM | POA: Diagnosis not present

## 2016-09-06 DIAGNOSIS — E119 Type 2 diabetes mellitus without complications: Secondary | ICD-10-CM | POA: Diagnosis not present

## 2016-09-06 DIAGNOSIS — Z7901 Long term (current) use of anticoagulants: Secondary | ICD-10-CM | POA: Diagnosis not present

## 2016-09-06 DIAGNOSIS — I10 Essential (primary) hypertension: Secondary | ICD-10-CM | POA: Diagnosis not present

## 2016-09-07 DIAGNOSIS — Z7901 Long term (current) use of anticoagulants: Secondary | ICD-10-CM | POA: Diagnosis not present

## 2016-09-07 DIAGNOSIS — T8189XD Other complications of procedures, not elsewhere classified, subsequent encounter: Secondary | ICD-10-CM | POA: Diagnosis not present

## 2016-09-07 DIAGNOSIS — I4891 Unspecified atrial fibrillation: Secondary | ICD-10-CM | POA: Diagnosis not present

## 2016-09-07 DIAGNOSIS — I2510 Atherosclerotic heart disease of native coronary artery without angina pectoris: Secondary | ICD-10-CM | POA: Diagnosis not present

## 2016-09-07 DIAGNOSIS — E119 Type 2 diabetes mellitus without complications: Secondary | ICD-10-CM | POA: Diagnosis not present

## 2016-09-07 DIAGNOSIS — I10 Essential (primary) hypertension: Secondary | ICD-10-CM | POA: Diagnosis not present

## 2016-09-08 ENCOUNTER — Encounter: Payer: Medicare Other | Attending: Internal Medicine | Admitting: Internal Medicine

## 2016-09-08 ENCOUNTER — Encounter (HOSPITAL_COMMUNITY): Payer: Self-pay | Admitting: Emergency Medicine

## 2016-09-08 ENCOUNTER — Emergency Department (HOSPITAL_COMMUNITY): Payer: Medicare Other

## 2016-09-08 ENCOUNTER — Ambulatory Visit (HOSPITAL_COMMUNITY): Admission: RE | Admit: 2016-09-08 | Payer: Medicare Other | Source: Ambulatory Visit

## 2016-09-08 ENCOUNTER — Inpatient Hospital Stay (HOSPITAL_COMMUNITY)
Admission: EM | Admit: 2016-09-08 | Discharge: 2016-09-11 | DRG: 623 | Disposition: A | Discharge status: Skilled Nursing Facility | Payer: Medicare Other | Attending: Family Medicine | Admitting: Family Medicine

## 2016-09-08 DIAGNOSIS — L03115 Cellulitis of right lower limb: Secondary | ICD-10-CM | POA: Diagnosis present

## 2016-09-08 DIAGNOSIS — E11622 Type 2 diabetes mellitus with other skin ulcer: Secondary | ICD-10-CM | POA: Diagnosis not present

## 2016-09-08 DIAGNOSIS — L03125 Acute lymphangitis of right lower limb: Secondary | ICD-10-CM | POA: Diagnosis not present

## 2016-09-08 DIAGNOSIS — R262 Difficulty in walking, not elsewhere classified: Secondary | ICD-10-CM | POA: Diagnosis not present

## 2016-09-08 DIAGNOSIS — I4891 Unspecified atrial fibrillation: Secondary | ICD-10-CM | POA: Diagnosis not present

## 2016-09-08 DIAGNOSIS — M8660 Other chronic osteomyelitis, unspecified site: Secondary | ICD-10-CM | POA: Diagnosis not present

## 2016-09-08 DIAGNOSIS — M869 Osteomyelitis, unspecified: Secondary | ICD-10-CM | POA: Diagnosis present

## 2016-09-08 DIAGNOSIS — Z87891 Personal history of nicotine dependence: Secondary | ICD-10-CM | POA: Insufficient documentation

## 2016-09-08 DIAGNOSIS — I10 Essential (primary) hypertension: Secondary | ICD-10-CM | POA: Diagnosis not present

## 2016-09-08 DIAGNOSIS — L97412 Non-pressure chronic ulcer of right heel and midfoot with fat layer exposed: Secondary | ICD-10-CM | POA: Diagnosis not present

## 2016-09-08 DIAGNOSIS — E119 Type 2 diabetes mellitus without complications: Secondary | ICD-10-CM

## 2016-09-08 DIAGNOSIS — Z7901 Long term (current) use of anticoagulants: Secondary | ICD-10-CM

## 2016-09-08 DIAGNOSIS — S91331D Puncture wound without foreign body, right foot, subsequent encounter: Secondary | ICD-10-CM | POA: Diagnosis not present

## 2016-09-08 DIAGNOSIS — E785 Hyperlipidemia, unspecified: Secondary | ICD-10-CM | POA: Diagnosis not present

## 2016-09-08 DIAGNOSIS — L97414 Non-pressure chronic ulcer of right heel and midfoot with necrosis of bone: Secondary | ICD-10-CM | POA: Diagnosis not present

## 2016-09-08 DIAGNOSIS — E11621 Type 2 diabetes mellitus with foot ulcer: Secondary | ICD-10-CM | POA: Diagnosis not present

## 2016-09-08 DIAGNOSIS — K5900 Constipation, unspecified: Secondary | ICD-10-CM | POA: Diagnosis not present

## 2016-09-08 DIAGNOSIS — Z880 Allergy status to penicillin: Secondary | ICD-10-CM | POA: Insufficient documentation

## 2016-09-08 DIAGNOSIS — E118 Type 2 diabetes mellitus with unspecified complications: Secondary | ICD-10-CM | POA: Diagnosis not present

## 2016-09-08 DIAGNOSIS — I7092 Chronic total occlusion of artery of the extremities: Secondary | ICD-10-CM | POA: Diagnosis not present

## 2016-09-08 DIAGNOSIS — Z978 Presence of other specified devices: Secondary | ICD-10-CM | POA: Diagnosis not present

## 2016-09-08 DIAGNOSIS — M86171 Other acute osteomyelitis, right ankle and foot: Secondary | ICD-10-CM | POA: Diagnosis not present

## 2016-09-08 DIAGNOSIS — S91309A Unspecified open wound, unspecified foot, initial encounter: Secondary | ICD-10-CM

## 2016-09-08 DIAGNOSIS — L97419 Non-pressure chronic ulcer of right heel and midfoot with unspecified severity: Secondary | ICD-10-CM | POA: Diagnosis not present

## 2016-09-08 DIAGNOSIS — E11628 Type 2 diabetes mellitus with other skin complications: Secondary | ICD-10-CM | POA: Diagnosis not present

## 2016-09-08 DIAGNOSIS — I2510 Atherosclerotic heart disease of native coronary artery without angina pectoris: Secondary | ICD-10-CM | POA: Diagnosis present

## 2016-09-08 DIAGNOSIS — I482 Chronic atrial fibrillation: Secondary | ICD-10-CM | POA: Diagnosis not present

## 2016-09-08 DIAGNOSIS — E1165 Type 2 diabetes mellitus with hyperglycemia: Secondary | ICD-10-CM | POA: Diagnosis present

## 2016-09-08 DIAGNOSIS — M6281 Muscle weakness (generalized): Secondary | ICD-10-CM | POA: Diagnosis not present

## 2016-09-08 DIAGNOSIS — Z794 Long term (current) use of insulin: Secondary | ICD-10-CM | POA: Diagnosis not present

## 2016-09-08 DIAGNOSIS — E1151 Type 2 diabetes mellitus with diabetic peripheral angiopathy without gangrene: Secondary | ICD-10-CM | POA: Diagnosis not present

## 2016-09-08 DIAGNOSIS — B954 Other streptococcus as the cause of diseases classified elsewhere: Secondary | ICD-10-CM | POA: Diagnosis not present

## 2016-09-08 DIAGNOSIS — M8618 Other acute osteomyelitis, other site: Secondary | ICD-10-CM | POA: Diagnosis not present

## 2016-09-08 DIAGNOSIS — E1169 Type 2 diabetes mellitus with other specified complication: Principal | ICD-10-CM | POA: Diagnosis present

## 2016-09-08 DIAGNOSIS — Z881 Allergy status to other antibiotic agents status: Secondary | ICD-10-CM | POA: Diagnosis not present

## 2016-09-08 DIAGNOSIS — L97409 Non-pressure chronic ulcer of unspecified heel and midfoot with unspecified severity: Secondary | ICD-10-CM

## 2016-09-08 DIAGNOSIS — E11649 Type 2 diabetes mellitus with hypoglycemia without coma: Secondary | ICD-10-CM | POA: Diagnosis not present

## 2016-09-08 DIAGNOSIS — Z79899 Other long term (current) drug therapy: Secondary | ICD-10-CM | POA: Diagnosis not present

## 2016-09-08 DIAGNOSIS — S91301D Unspecified open wound, right foot, subsequent encounter: Secondary | ICD-10-CM | POA: Diagnosis not present

## 2016-09-08 DIAGNOSIS — E1142 Type 2 diabetes mellitus with diabetic polyneuropathy: Secondary | ICD-10-CM | POA: Diagnosis not present

## 2016-09-08 DIAGNOSIS — C9110 Chronic lymphocytic leukemia of B-cell type not having achieved remission: Secondary | ICD-10-CM | POA: Diagnosis not present

## 2016-09-08 DIAGNOSIS — S91331A Puncture wound without foreign body, right foot, initial encounter: Secondary | ICD-10-CM | POA: Diagnosis not present

## 2016-09-08 LAB — CBC WITH DIFFERENTIAL/PLATELET
Basophils Absolute: 0 10*3/uL (ref 0.0–0.1)
Basophils Relative: 0 %
EOS ABS: 0.7 10*3/uL (ref 0.0–0.7)
Eosinophils Relative: 4 %
HCT: 39.4 % (ref 39.0–52.0)
HEMOGLOBIN: 12.9 g/dL — AB (ref 13.0–17.0)
LYMPHS PCT: 56 %
Lymphs Abs: 9.7 10*3/uL — ABNORMAL HIGH (ref 0.7–4.0)
MCH: 29 pg (ref 26.0–34.0)
MCHC: 32.7 g/dL (ref 30.0–36.0)
MCV: 88.5 fL (ref 78.0–100.0)
Monocytes Absolute: 1.2 10*3/uL — ABNORMAL HIGH (ref 0.1–1.0)
Monocytes Relative: 7 %
NEUTROS PCT: 33 %
Neutro Abs: 5.7 10*3/uL (ref 1.7–7.7)
Platelets: 226 10*3/uL (ref 150–400)
RBC: 4.45 MIL/uL (ref 4.22–5.81)
RDW: 14.5 % (ref 11.5–15.5)
WBC: 17.3 10*3/uL — AB (ref 4.0–10.5)

## 2016-09-08 LAB — COMPREHENSIVE METABOLIC PANEL
ALBUMIN: 3.5 g/dL (ref 3.5–5.0)
ALK PHOS: 61 U/L (ref 38–126)
ALT: 14 U/L — AB (ref 17–63)
AST: 19 U/L (ref 15–41)
Anion gap: 9 (ref 5–15)
BUN: 33 mg/dL — ABNORMAL HIGH (ref 6–20)
CALCIUM: 9.5 mg/dL (ref 8.9–10.3)
CHLORIDE: 100 mmol/L — AB (ref 101–111)
CO2: 28 mmol/L (ref 22–32)
CREATININE: 0.92 mg/dL (ref 0.61–1.24)
GFR calc Af Amer: >60 mL/min (ref >60)
GFR calc non Af Amer: >60 mL/min (ref >60)
GLUCOSE: 190 mg/dL — AB (ref 65–99)
Potassium: 4 mmol/L (ref 3.5–5.1)
SODIUM: 137 mmol/L (ref 135–145)
Total Bilirubin: 0.8 mg/dL (ref 0.3–1.2)
Total Protein: 6.3 g/dL — ABNORMAL LOW (ref 6.5–8.1)

## 2016-09-08 LAB — SEDIMENTATION RATE: Sed Rate: 32 mm/hr — ABNORMAL HIGH (ref 0–16)

## 2016-09-08 LAB — PROTIME-INR
INR: 2.01
PROTHROMBIN TIME: 23.1 s — AB (ref 11.4–15.2)

## 2016-09-08 LAB — C-REACTIVE PROTEIN

## 2016-09-08 LAB — GLUCOSE, CAPILLARY: Glucose-Capillary: 197 mg/dL — ABNORMAL HIGH (ref 65–99)

## 2016-09-08 MED ORDER — DEXTROSE 5 % IV SOLN
2.0000 g | Freq: Once | INTRAVENOUS | Status: AC
  Administered 2016-09-08: 2 g via INTRAVENOUS
  Filled 2016-09-08: qty 2

## 2016-09-08 MED ORDER — ONDANSETRON HCL 4 MG PO TABS: 4.0000 mg | ORAL_TABLET | Freq: Four times a day (QID) | ORAL | Status: DC | PRN

## 2016-09-08 MED ORDER — OMEGA-3-ACID ETHYL ESTERS 1 G PO CAPS
2.0000 | ORAL_CAPSULE | Freq: Two times a day (BID) | ORAL | Status: DC
  Administered 2016-09-08 – 2016-09-11 (×5): 2 g via ORAL
  Filled 2016-09-08 (×5): qty 2

## 2016-09-08 MED ORDER — POLYETHYLENE GLYCOL 3350 17 G PO PACK
17.0000 g | PACK | Freq: Every day | ORAL | Status: DC | PRN
  Administered 2016-09-09 – 2016-09-10 (×2): 17 g via ORAL
  Filled 2016-09-08 (×2): qty 1

## 2016-09-08 MED ORDER — SODIUM CHLORIDE 0.9% FLUSH: 3.0000 mL | INTRAVENOUS | Status: DC | PRN

## 2016-09-08 MED ORDER — WARFARIN SODIUM 5 MG PO TABS: 5.0000 mg | ORAL_TABLET | Freq: Every evening | ORAL | Status: DC

## 2016-09-08 MED ORDER — GLIMEPIRIDE 2 MG PO TABS: 2.0000 mg | ORAL_TABLET | Freq: Every day | ORAL | Status: DC

## 2016-09-08 MED ORDER — AZTREONAM 1 G IJ SOLR
1.0000 g | Freq: Three times a day (TID) | INTRAMUSCULAR | Status: DC
  Filled 2016-09-08 (×2): qty 1

## 2016-09-08 MED ORDER — PIOGLITAZONE HCL 30 MG PO TABS: 30.0000 mg | ORAL_TABLET | Freq: Every evening | ORAL | Status: DC

## 2016-09-08 MED ORDER — LISINOPRIL 10 MG PO TABS
40.0000 mg | ORAL_TABLET | Freq: Every day | ORAL | Status: DC
  Administered 2016-09-09 – 2016-09-11 (×3): 40 mg via ORAL
  Filled 2016-09-08 (×3): qty 4

## 2016-09-08 MED ORDER — ADULT MULTIVITAMIN W/MINERALS CH
1.0000 | ORAL_TABLET | Freq: Every day | ORAL | Status: DC
  Administered 2016-09-10 – 2016-09-11 (×2): 1 via ORAL
  Filled 2016-09-08 (×2): qty 1

## 2016-09-08 MED ORDER — CICLOPIROX OLAMINE 0.77 % EX CREA: 1.0000 "application " | TOPICAL_CREAM | Freq: Every day | CUTANEOUS | Status: DC | PRN

## 2016-09-08 MED ORDER — AMLODIPINE BESYLATE 5 MG PO TABS
5.0000 mg | ORAL_TABLET | Freq: Every day | ORAL | Status: DC
  Administered 2016-09-10 – 2016-09-11 (×3): 5 mg via ORAL
  Filled 2016-09-08 (×2): qty 1

## 2016-09-08 MED ORDER — LIRAGLUTIDE 18 MG/3ML ~~LOC~~ SOPN
1.8000 mg | PEN_INJECTOR | Freq: Every day | SUBCUTANEOUS | Status: DC
  Filled 2016-09-08: qty 3

## 2016-09-08 MED ORDER — INSULIN ASPART 100 UNIT/ML ~~LOC~~ SOLN
0.0000 [IU] | Freq: Three times a day (TID) | SUBCUTANEOUS | Status: DC
Start: 2016-09-09 — End: 2016-09-10
  Administered 2016-09-09: 3 [IU] via SUBCUTANEOUS
  Administered 2016-09-09 – 2016-09-10 (×2): 5 [IU] via SUBCUTANEOUS
  Administered 2016-09-10: 3 [IU] via SUBCUTANEOUS

## 2016-09-08 MED ORDER — ATORVASTATIN CALCIUM 10 MG PO TABS
10.0000 mg | ORAL_TABLET | Freq: Every evening | ORAL | Status: DC
  Administered 2016-09-08 – 2016-09-11 (×4): 10 mg via ORAL
  Filled 2016-09-08 (×4): qty 1

## 2016-09-08 MED ORDER — VITAMIN D 1000 UNITS PO TABS
1000.0000 [IU] | ORAL_TABLET | Freq: Every morning | ORAL | Status: DC
  Administered 2016-09-10 – 2016-09-11 (×2): 1000 [IU] via ORAL
  Filled 2016-09-08 (×2): qty 1

## 2016-09-08 MED ORDER — VITAMIN C 500 MG PO TABS
1000.0000 mg | ORAL_TABLET | Freq: Every day | ORAL | Status: DC
  Administered 2016-09-10 – 2016-09-11 (×2): 1000 mg via ORAL
  Filled 2016-09-08 (×2): qty 2

## 2016-09-08 MED ORDER — DIGOXIN 125 MCG PO TABS
0.1250 mg | ORAL_TABLET | Freq: Every day | ORAL | Status: DC
  Administered 2016-09-10 – 2016-09-11 (×2): 0.125 mg via ORAL
  Filled 2016-09-08 (×2): qty 1

## 2016-09-08 MED ORDER — ONDANSETRON HCL 4 MG/2ML IJ SOLN: 4.0000 mg | Freq: Four times a day (QID) | INTRAMUSCULAR | Status: DC | PRN

## 2016-09-08 MED ORDER — SODIUM CHLORIDE 0.9% FLUSH
3.0000 mL | Freq: Two times a day (BID) | INTRAVENOUS | Status: DC
  Administered 2016-09-08 – 2016-09-11 (×4): 3 mL via INTRAVENOUS

## 2016-09-08 MED ORDER — VANCOMYCIN HCL IN DEXTROSE 1-5 GM/200ML-% IV SOLN
1000.0000 mg | Freq: Two times a day (BID) | INTRAVENOUS | Status: DC
  Administered 2016-09-08 – 2016-09-10 (×5): 1000 mg via INTRAVENOUS
  Filled 2016-09-08 (×6): qty 200

## 2016-09-08 MED ORDER — HYDROCODONE-ACETAMINOPHEN 7.5-325 MG PO TABS
1.0000 | ORAL_TABLET | ORAL | Status: DC | PRN
  Administered 2016-09-09 – 2016-09-11 (×3): 1 via ORAL
  Filled 2016-09-08 (×3): qty 1

## 2016-09-08 MED ORDER — LORAZEPAM 1 MG PO TABS
2.0000 mg | ORAL_TABLET | Freq: Every day | ORAL | Status: DC
  Administered 2016-09-08 – 2016-09-10 (×3): 2 mg via ORAL
  Filled 2016-09-08 (×3): qty 2

## 2016-09-08 MED ORDER — CO Q-10 100 MG PO CAPS: 100.0000 mg | ORAL_CAPSULE | Freq: Every day | ORAL | Status: DC

## 2016-09-08 MED ORDER — INSULIN DETEMIR 100 UNIT/ML FLEXPEN: 50.0000 [IU] | PEN_INJECTOR | Freq: Every day | SUBCUTANEOUS | Status: DC

## 2016-09-08 MED ORDER — VANCOMYCIN HCL IN DEXTROSE 1-5 GM/200ML-% IV SOLN
1000.0000 mg | Freq: Once | INTRAVENOUS | Status: AC
  Administered 2016-09-08: 1000 mg via INTRAVENOUS
  Filled 2016-09-08: qty 200

## 2016-09-08 MED ORDER — IPRATROPIUM BROMIDE 0.06 % NA SOLN
2.0000 | Freq: Two times a day (BID) | NASAL | Status: DC
  Administered 2016-09-09 – 2016-09-10 (×4): 2 via NASAL
  Filled 2016-09-08: qty 15

## 2016-09-08 MED ORDER — SODIUM CHLORIDE 0.9 % IV SOLN: 250.0000 mL | INTRAVENOUS | Status: DC | PRN

## 2016-09-08 MED ORDER — SENNA 8.6 MG PO TABS
1.0000 | ORAL_TABLET | Freq: Two times a day (BID) | ORAL | Status: DC
  Administered 2016-09-08 – 2016-09-11 (×5): 8.6 mg via ORAL
  Filled 2016-09-08 (×5): qty 1

## 2016-09-08 MED ORDER — TRAZODONE HCL 50 MG PO TABS: 50.0000 mg | ORAL_TABLET | Freq: Every evening | ORAL | Status: DC | PRN

## 2016-09-08 MED ORDER — ACETAMINOPHEN 325 MG PO TABS: 650.0000 mg | ORAL_TABLET | Freq: Four times a day (QID) | ORAL | Status: DC | PRN

## 2016-09-08 MED ORDER — CALCIUM CARBONATE-VITAMIN D 500-200 MG-UNIT PO TABS
2.0000 | ORAL_TABLET | Freq: Every day | ORAL | Status: DC
  Administered 2016-09-10 – 2016-09-11 (×2): 2 via ORAL
  Filled 2016-09-08: qty 1
  Filled 2016-09-08: qty 2

## 2016-09-08 MED ORDER — VITAMIN E 180 MG (400 UNIT) PO CAPS
400.0000 [IU] | ORAL_CAPSULE | Freq: Every day | ORAL | Status: DC
  Administered 2016-09-10 – 2016-09-11 (×3): 400 [IU] via ORAL
  Filled 2016-09-08 (×3): qty 1

## 2016-09-08 MED ORDER — ALBUTEROL SULFATE (2.5 MG/3ML) 0.083% IN NEBU: 2.5000 mg | INHALATION_SOLUTION | RESPIRATORY_TRACT | Status: DC | PRN

## 2016-09-08 MED ORDER — ACETAMINOPHEN 650 MG RE SUPP: 650.0000 mg | Freq: Four times a day (QID) | RECTAL | Status: DC | PRN

## 2016-09-08 NOTE — Progress Notes (Signed)
PHARMACIST - PHYSICIAN ORDER COMMUNICATION  CONCERNING: P&T Medication Policy on Herbal Medications  DESCRIPTION:  This patient's order for:  Co-Q10  has been noted.  This product(s) is classified as an "herbal" or natural product. Due to a lack of definitive safety studies or FDA approval, nonstandard manufacturing practices, plus the potential risk of unknown drug-drug interactions while on inpatient medications, the Pharmacy and Therapeutics Committee does not permit the use of "herbal" or natural products of this type within [facility].   ACTION TAKEN: The pharmacy department is unable to verify this order at this time and your patient has been informed of this safety policy. Please reevaluate patient's clinical condition at discharge and address if the herbal or natural product(s) should be resumed at that time.  Thanks,  Fabio Neighbors, PharmD

## 2016-09-08 NOTE — ED Triage Notes (Signed)
Patient sent here by wound clinic for worsening wound to right heel. States wound has purulent drainage. States he is scheduled for MRI on wound today at 1700. States he took one hydrocodone prior to arrival to ER today.

## 2016-09-08 NOTE — ED Provider Notes (Signed)
[facility] DEPT Provider Note   CSN: [id number] Arrival date & time: 09/08/16  1451     History   Chief Complaint Chief Complaint  Patient presents with  . Wound Check    HPI [name] [name] is a 82 y.o. male.  HPI Patient with puncture wound to the right calcaneus the end of April. He's had surgical debridement and is currently on clindamycin. Was seen in the wound clinic today. Concern for worsening infection. He's had purulent drainage from the wound and diffuse swelling. Denies subjective fevers or chills. Continues to have a good appetite. Past Medical History:  Diagnosis Date  . Atrial fibrillation ([place])   . Chronic lymphocytic leukemia ([place])   . Coronary atherosclerosis of native coronary artery    Nonobstructive  . Dysrhythmia   . Essential hypertension, benign   . Hyperlipidemia   . IgA nephropathy    Dr. Lowanda Foster  . Type 2 diabetes mellitus ([place])   . UTI (urinary tract infection) july  10 th   taking  med     Patient Active Problem List   Diagnosis Date Noted  . 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 Cellulitis of heel, right 09/08/2016  . Sepsis ([place]) 06/21/2016  . Hypoglycemia 06/21/2016  . Acute metabolic encephalopathy 62/83/1517  . Type 2 diabetes mellitus ([place])   . Chronic lymphocytic leukemia ([place])   . Encounter for therapeutic drug monitoring 05/16/2013  . Cardiac murmur 10/21/2011  . Long term current use of anticoagulant therapy 06/27/2010  . Mixed hyperlipidemia 05/21/2008  . CORONARY ATHEROSCLEROSIS NATIVE CORONARY ARTERY 05/21/2008  . Atrial fibrillation ([place]) 05/21/2008  . DIABETES 06/14/2007  . KNEE PAIN 06/14/2007    Past Surgical History:  Procedure Laterality Date  . AMPUTATION Left 09/15/2012   Procedure: PARTIAL AMPUTATION 3rd TOE LEFT FOOT;  Surgeon: Marcheta Grammes, DPM;  Location: [facility];  Service: Orthopedics;  Laterality: Left;  . AMPUTATION Left 04/20/2013   Procedure: PARTIAL AMPUTATION 2ND TOE LEFT FOOT;   Surgeon: Marcheta Grammes, DPM;  Location: [facility];  Service: Orthopedics;  Laterality: Left;  . ANAL FISSURE REPAIR    . APPLICATION OF WOUND VAC Right 09/09/2016   Procedure: APPLICATION OF WOUND VAC;  Surgeon: Caprice Beaver, DPM;  Location: [facility];  Service: Podiatry;  Laterality: Right;  . CATARACT EXTRACTION W/PHACO Right 09/30/2015   Procedure: CATARACT EXTRACTION PHACO AND INTRAOCULAR LENS PLACEMENT (IOC);  Surgeon: Tonny Branch, MD;  Location: [facility];  Service: Ophthalmology;  Laterality: Right;  CDE: 11.78  . CATARACT EXTRACTION W/PHACO Left 10/31/2015   Procedure: CATARACT EXTRACTION PHACO AND INTRAOCULAR LENS PLACEMENT LEFT EYE; CDE:  9.10;  Surgeon: Tonny Branch, MD;  Location: [facility];  Service: Ophthalmology;  Laterality: Left;  . COLONOSCOPY  08/19/2011   Procedure: COLONOSCOPY;  Surgeon: Rogene Houston, MD;  Location: [facility];  Service: Endoscopy;  Laterality: N/A;  930  . COLONOSCOPY N/A 09/19/2014   Procedure: COLONOSCOPY;  Surgeon: Rogene Houston, MD;  Location: [facility];  Service: Endoscopy;  Laterality: N/A;  1055  . FEMORAL HERNIA REPAIR    . INCISION AND DRAINAGE Right 09/09/2016   Procedure: DEBRIDEMENT WOUND RT heel with wound vac attachment;  Surgeon: Caprice Beaver, DPM;  Location: [facility];  Service: Podiatry;  Laterality: Right;  . INCISION AND DRAINAGE OF WOUND Right 08/12/2016   Procedure: DEBRIDEMENT WOUND RIGHT HEEL;  Surgeon: Caprice Beaver, DPM;  Location: [facility];  Service: Podiatry;  Laterality: Right;  right heel  . Open  repair left quadriceps tendon.  08/22/07   Dr. Aline Brochure  . Right total knee replacement    . TRANSURETHRAL RESECTION OF PROSTATE    . WOUND EXPLORATION Right 08/12/2016   Procedure: EXPLORATION OF WOUND FOR FOREIGN BODY RIGHT HEEL;  Surgeon: Caprice Beaver, DPM;  Location: [facility];  Service: Podiatry;  Laterality: Right;  right heel       Home Medications    Prior to Admission medications   Medication Sig Start Date  End Date Taking? Authorizing Provider  acetaminophen (TYLENOL) 650 MG CR tablet Take 650 mg by mouth every 8 (eight) hours as needed for pain.   Yes [provider]  amLODipine (NORVASC) 5 MG tablet Take 5 mg by mouth daily with breakfast.   Yes [provider]  Ascorbic Acid (VITAMIN C) 1000 MG tablet Take 1,000 mg by mouth daily with breakfast.    Yes [provider]  atorvastatin (LIPITOR) 10 MG tablet Take 10 mg by mouth every evening.   Yes [provider]  Calcium Carb-Cholecalciferol (CALCIUM 600+D) 600-800 MG-UNIT TABS Take 2 tablets by mouth daily with breakfast.   Yes [provider]  cholecalciferol (VITAMIN D) 1000 units tablet Take 1,000 Units by mouth every morning.   Yes [provider]  ciclopirox (LOPROX) 0.77 % cream Apply 1 application topically daily as needed (for rash on foot).   Yes [provider]  Coenzyme Q10 (CO Q-10) 100 MG CAPS Take 100 mg by mouth daily with breakfast.    Yes [provider]  digoxin (LANOXIN) 0.125 MG tablet Take 0.125 mg by mouth daily with breakfast.    Yes [provider]  Fish Oil-Cholecalciferol (FISH OIL + D3) 1200-1000 MG-UNIT CAPS Take 2 capsules by mouth 2 (two) times daily.   Yes [provider]  glimepiride (AMARYL) 2 MG tablet Take 2 mg by mouth daily with breakfast.  06/11/16  Yes [provider]  hydrochlorothiazide 25 MG tablet Take 25 mg by mouth daily with breakfast.    Yes [provider]  HYDROcodone-acetaminophen (NORCO) 7.5-325 MG tablet Take 1 tablet by mouth every 4 (four) hours as needed for moderate pain.   Yes [provider]  insulin aspart (NOVOLOG FLEXPEN) 100 UNIT/ML FlexPen Inject 4-18 Units into the skin 2 (two) times daily before a meal. Pt uses per sliding scale before lunch and dinner.   Yes [provider]  ipratropium (ATROVENT) 0.06 % nasal spray Place 2 sprays into both nostrils 2 (two) times  daily.   Yes [provider]  LEVEMIR FLEXTOUCH 100 UNIT/ML Pen Inject 50 Units into the skin daily with breakfast. 06/04/16  Yes [provider]  lisinopril (PRINIVIL,ZESTRIL) 20 MG tablet Take 40 mg by mouth daily with supper. 07/07/16  Yes [provider]  LORazepam (ATIVAN) 1 MG tablet Take 2 mg by mouth at bedtime.    Yes [provider]  Multiple Vitamin (MULTIVITAMIN WITH MINERALS) TABS tablet Take 1 tablet by mouth daily with breakfast.   Yes [provider]  pioglitazone (ACTOS) 30 MG tablet Take 30 mg by mouth every evening.    Yes [provider]  VICTOZA 18 MG/3ML SOPN Inject 1.8 mg into the skin daily with breakfast. 06/05/16  Yes [provider]  vitamin E 400 UNIT capsule Take 400 Units by mouth daily with breakfast.    Yes [provider]  warfarin (COUMADIN) 5 MG tablet Take 5 mg by mouth every evening.   Yes [provider]  cefdinir (OMNICEF) 300 MG capsule Take 1 capsule (300 mg total) by mouth 2 (two) times daily. Patient not taking: Reported on 08/10/2016 06/25/16   Kathie Dike, MD  clindamycin (CLEOCIN) 300 MG capsule Take 300 mg by mouth 4 (four) times daily. 09/01/16   [provider]    Family History Family History  Problem Relation Age of Onset  . Atrial fibrillation Mother     Social History Social History  Substance Use Topics  . Smoking status: Former Smoker    Years: 10.00    Types: Cigarettes    Quit date: 04/07/1963  . Smokeless tobacco: Never Used  . Alcohol use No     Allergies   Erythromycin and Penicillins   Review of Systems Review of Systems  Constitutional: Negative for appetite change, chills, fatigue and fever.  Respiratory: Negative for shortness of breath.   Cardiovascular: Negative for chest pain.  Gastrointestinal: Negative for abdominal pain, nausea and vomiting.  Musculoskeletal: Negative for arthralgias and joint swelling.  Skin: Positive for  wound.  Neurological: Negative for weakness and numbness.  All other systems reviewed and are negative.    Physical Exam Updated Vital Signs BP (!) 131/48 (BP Location: Right Arm)   Pulse 82   Temp 98.3 F (36.8 C) (Oral)   Resp 18   Ht 6' (1.829 m)   Wt 129.8 kg (286 lb 3.2 oz)   SpO2 96%   BMI 38.82 kg/m   Physical Exam  Constitutional: He is oriented to person, place, and time. He appears well-developed and well-nourished.  HENT:  Head: Normocephalic and atraumatic.  Eyes: EOM are normal. Pupils are equal, round, and reactive to light.  Neck: Normal range of motion. Neck supple.  Cardiovascular: Normal rate and regular rhythm.   Pulmonary/Chest: Effort normal and breath sounds normal.  Abdominal: Soft. Bowel sounds are normal. There is no tenderness. There is no rebound and no guarding.  Musculoskeletal: Normal range of motion. He exhibits edema and tenderness.  Patient appears to have 2 ulcerations to the posterior right foot overlying the calcaneus. There is surrounding mild erythema and edema to the mid foot. Moderate purulent discharge.  Neurological: He is alert and oriented to person, place, and time.  Hard of hearing. Moving all extremities without deficit. Sensation intact.  Skin: Skin is warm and dry. No rash noted. No erythema.  Psychiatric: He has a normal mood and affect. His behavior is normal.  Nursing note and vitals reviewed.    ED Treatments / Results  Labs (all labs ordered are listed, but only abnormal results are displayed) Labs Reviewed  CBC WITH DIFFERENTIAL/PLATELET - Abnormal; Notable for the following:       Result Value   WBC 17.3 (*)    Hemoglobin 12.9 (*)    Lymphs Abs 9.7 (*)    Monocytes Absolute 1.2 (*)    All other components within normal limits  COMPREHENSIVE METABOLIC PANEL - Abnormal; Notable for the following:    Chloride 100 (*)    Glucose, Bld 190 (*)    BUN 33 (*)    Total Protein 6.3 (*)    ALT 14 (*)    All other  components within normal limits  SEDIMENTATION RATE - Abnormal; Notable for the following:    Sed Rate 32 (*)    All other components within normal limits  PROTIME-INR - Abnormal; Notable for the following:    Prothrombin Time 23.1 (*)    All other components within normal limits  BASIC  METABOLIC PANEL - Abnormal; Notable for the following:    Chloride 99 (*)    Glucose, Bld 243 (*)    BUN 32 (*)    All other components within normal limits  CBC - Abnormal; Notable for the following:    WBC 16.0 (*)    Hemoglobin 12.8 (*)    All other components within normal limits  PROTIME-INR - Abnormal; Notable for the following:    Prothrombin Time 22.4 (*)    All other components within normal limits  GLUCOSE, CAPILLARY - Abnormal; Notable for the following:    Glucose-Capillary 197 (*)    All other components within normal limits  GLUCOSE, CAPILLARY - Abnormal; Notable for the following:    Glucose-Capillary 211 (*)    All other components within normal limits  GLUCOSE, CAPILLARY - Abnormal; Notable for the following:    Glucose-Capillary 183 (*)    All other components within normal limits  GLUCOSE, CAPILLARY - Abnormal; Notable for the following:    Glucose-Capillary 258 (*)    All other components within normal limits  GLUCOSE, CAPILLARY - Abnormal; Notable for the following:    Glucose-Capillary 249 (*)    All other components within normal limits  GLUCOSE, CAPILLARY - Abnormal; Notable for the following:    Glucose-Capillary 262 (*)    All other components within normal limits  GLUCOSE, CAPILLARY - Abnormal; Notable for the following:    Glucose-Capillary 234 (*)    All other components within normal limits  AEROBIC CULTURE (SUPERFICIAL SPECIMEN)  CULTURE, BLOOD (ROUTINE X 2)  CULTURE, BLOOD (ROUTINE X 2)  AEROBIC/ANAEROBIC CULTURE (SURGICAL/DEEP WOUND)  C-REACTIVE PROTEIN    EKG  EKG Interpretation None       Radiology Mr Foot Right Wo Contrast  Result Date:  09/08/2016 CLINICAL DATA:  Right heel pain, nonhealing ulcer to the heel. EXAM: MRI OF THE RIGHT FOREFOOT WITHOUT CONTRAST TECHNIQUE: Multiplanar, multisequence MR imaging of the right hindfoot was performed. No intravenous contrast was administered. COMPARISON:  08/03/2016 MRI, 09/08/2016 radiographs FINDINGS: Once again, no T1 weighted images performed secondary to patient discomfort. Patient refused additional imaging. Proton density and T2 fat saturated axial images in the sagittal inversion recovery sequence were able to be portably. Bones/Joint/Cartilage The posterolateral corner of the calcaneus demonstrates subtle subcortical marrow edema and an indistinct appearance of the cortical margin, series 4, image 20 and series 5, image 22. This is deep to an area of known ulceration and findings are concerning for changes of early osteomyelitis given technical limitations of an incomplete study and lack of IV contrast. Ligaments Intact lateral and medial ligamentous complexes crossing the ankle joint. Muscles and Tendons The flexor and extensor tendons crossing the ankle joint are of normal signal intensity morphology. Achilles tendon appears intact. Soft tissues Heel ulcer measuring approximately 3.5 x 1.7 x 2 cm. IMPRESSION: Soft tissue ulceration of the heel estimated at 3.5 x 1.7 x 2 cm with underlying cortical involvement of the posterolateral corner of the calcaneus and with subcortical marrow edema noted. Findings are suspicious for developing osteomyelitis. Electronically Signed   By: Ashley Royalty M.D.   On: 09/08/2016 18:17   US Arterial Seg Multiple  Result Date: 09/09/2016 CLINICAL DATA:  Nonhealing open wound of heel EXAM: NONINVASIVE PHYSIOLOGIC VASCULAR STUDY OF BILATERAL LOWER EXTREMITIES TECHNIQUE: Evaluation of both lower extremities was performed at rest, including calculation of ankle-brachial indices, multiple segmental pressure evaluation, segmental Doppler and segmental pulse volume  recording. COMPARISON:  08/12/2012 FINDINGS: Right ABI:  Non calculable due to vascular noncompressibility Left ABI:  Non calculable due to vascular noncompressibility Right Lower Extremity: Biphasic arterial waveforms are noted distally. Multiple levels of vascular noncompressibility . Left Lower Extremity: Biphasic arterial waveforms distally. Multiple levels of vascular noncompressibility . IMPRESSION: Segmental noninvasive evaluation is limited due to extensive vascular noncompressibility presumably secondary to medial calcification. However, the normal distal arterial waveforms suggest no high-grade lower extremity arterial occlusive disease at rest. If there is continued high clinical concern of hemodynamically significant lower extremity arterial occlusive disease, recommend MRA runoff for better evaluation. Electronically Signed   By: Lucrezia Europe M.D.   On: 09/09/2016 16:57   Dg Foot Complete Right  Result Date: 09/08/2016 CLINICAL DATA:  Right heel ulcer worsening. EXAM: RIGHT FOOT COMPLETE - 3+ VIEW COMPARISON:  None. FINDINGS: Mild degenerative change over the first MTP joint and interphalangeal joints. No evidence of acute fracture or dislocation. No evidence of bone destruction to suggest osteomyelitis. No significant air in the soft tissues. Mild soft tissue irregularity over the posterior soft tissues of the ankle/heel compatible with known ulceration. Minimal spurring over the inferior posterior calcaneus. Small vessel atherosclerotic disease. IMPRESSION: Soft tissue changes compatible with known heel ulcer. No underlying bony abnormality. Mild degenerative changes as described. Electronically Signed   By: Marin Olp M.D.   On: 09/08/2016 17:13    Procedures Procedures (including critical care time)  Medications Ordered in ED Medications  LORazepam (ATIVAN) tablet 2 mg (2 mg Oral Given 09/09/16 2137)  vitamin E capsule 400 Units (400 Units Oral Given 09/10/16 0815)  vitamin C (ASCORBIC  ACID) tablet 1,000 mg (1,000 mg Oral Given 09/10/16 0810)  digoxin (LANOXIN) tablet 0.125 mg (0.125 mg Oral Given 09/10/16 0813)  atorvastatin (LIPITOR) tablet 10 mg (10 mg Oral Given 09/09/16 1710)  lisinopril (PRINIVIL,ZESTRIL) tablet 40 mg (40 mg Oral Given 09/09/16 1710)  liraglutide SOPN 1.8 mg (1.8 mg Subcutaneous Not Given 09/10/16 0800)  amLODipine (NORVASC) tablet 5 mg (5 mg Oral Given 09/10/16 0910)  omega-3 acid ethyl esters (LOVAZA) capsule 2 g (2 g Oral Given 09/10/16 1054)  multivitamin with minerals tablet 1 tablet (1 tablet Oral Given 09/10/16 0815)  calcium-vitamin D (OSCAL WITH D) 500-200 MG-UNIT per tablet 2 tablet (2 tablets Oral Given 09/10/16 0814)  ipratropium (ATROVENT) 0.06 % nasal spray 2 spray (2 sprays Each Nare Given 09/10/16 1023)  HYDROcodone-acetaminophen (NORCO) 7.5-325 MG per tablet 1 tablet (1 tablet Oral Given 09/09/16 1851)  cholecalciferol (VITAMIN D) tablet 1,000 Units (1,000 Units Oral Given 09/10/16 1025)  sodium chloride flush (NS) 0.9 % injection 3 mL (3 mLs Intravenous Not Given 09/10/16 1000)  sodium chloride flush (NS) 0.9 % injection 3 mL ( Intravenous MAR Unhold 09/09/16 1314)  0.9 %  sodium chloride infusion ( Intravenous MAR Unhold 09/09/16 1314)  acetaminophen (TYLENOL) tablet 650 mg ( Oral MAR Unhold 09/09/16 1314)    Or  acetaminophen (TYLENOL) suppository 650 mg ( Rectal MAR Unhold 09/09/16 1314)  traZODone (DESYREL) tablet 50 mg ( Oral MAR Unhold 09/09/16 1314)  senna (SENOKOT) tablet 8.6 mg (8.6 mg Oral Given 09/10/16 1025)  polyethylene glycol (MIRALAX / GLYCOLAX) packet 17 g (17 g Oral Given 09/09/16 1853)  ondansetron (ZOFRAN) tablet 4 mg ( Oral MAR Unhold 09/09/16 1314)    Or  ondansetron (ZOFRAN) injection 4 mg ( Intravenous MAR Unhold 09/09/16 1314)  albuterol (PROVENTIL) (2.5 MG/3ML) 0.083% nebulizer solution 2.5 mg ( Nebulization MAR Unhold 09/09/16 1314)  insulin aspart (novoLOG) injection 0-9 Units (3 Units  Subcutaneous Given 09/10/16 1245)  vancomycin (VANCOCIN) IVPB 1000  mg/200 mL premix (0 mg Intravenous Stopped 09/10/16 1345)  ceFEPIme (MAXIPIME) 2 g in dextrose 5 % 50 mL IVPB (0 g Intravenous Stopped 09/10/16 1056)  insulin glargine (LANTUS) injection 10 Units (10 Units Subcutaneous Given 09/09/16 2300)  insulin aspart (novoLOG) injection 5 Units (5 Units Subcutaneous Given 09/10/16 1244)  vancomycin (VANCOCIN) IVPB 1000 mg/200 mL premix (0 mg Intravenous Stopped 09/08/16 1651)  aztreonam (AZACTAM) 2 g in dextrose 5 % 50 mL IVPB (0 g Intravenous Stopped 09/08/16 2233)  Chlorhexidine Gluconate Cloth 2 % PADS 6 each (6 each Topical Given 09/09/16 0609)    And  Chlorhexidine Gluconate Cloth 2 % PADS 6 each (6 each Topical Given 09/09/16 0215)  clindamycin (CLEOCIN) IVPB 600 mg (600 mg Intravenous Given 09/09/16 1045)  fentaNYL (SUBLIMAZE) injection 25 mcg (25 mcg Intravenous Given 09/09/16 0947)     Initial Impression / Assessment and Plan / ED Course  I have reviewed the triage vital signs and the nursing notes.  Pertinent labs & imaging results that were available during my care of the patient were reviewed by me and considered in my medical decision making (see chart for details).   started IV vancomycin. Discussed with patient's podiatrist who will see in the emergency department. Hospitalist to admit.  Final Clinical Impressions(s) / ED Diagnoses   Final diagnoses:  Heel ulcer due to DM [facility])    New Prescriptions Current Discharge Medication List       Julianne Rice, MD 09/10/16 1401

## 2016-09-08 NOTE — Progress Notes (Signed)
Pharmacy Antibiotic Note  [name] [name] is a 82 y.o. male admitted on 09/08/2016 with sepsis / osteomyelitis.  Pharmacy has been consulted for Vancomycin and Aztreonam dosing.  Plan:  Vancomycin 1000mg  IV q12h Check trough at steady state Aztreonam 2gm x 1 then 1gm IV q8h, EID Monitor labs, renal fxn, progress and c/s Deescalate ABX when improved / appropriate.    Height: 6' (182.9 cm) Weight: 285 lb (129.3 kg) IBW/kg (Calculated) : 77.6  Temp (24hrs), Avg:97.6 F (36.4 C), Min:97.6 F (36.4 C), Max:97.6 F (36.4 C)   Recent Labs Lab 09/08/16 1545  WBC 17.3*  CREATININE 0.92    Estimated Creatinine Clearance: 90.5 mL/min (by C-G formula based on SCr of 0.92 mg/dL).    Allergies  Allergen Reactions  . Erythromycin Other (See Comments)  . Penicillins Hives and Other (See Comments)    Has patient had a PCN reaction causing immediate rash, facial/tongue/throat swelling, SOB or lightheadedness with hypotension: No Has patient had a PCN reaction causing severe rash involving mucus membranes or skin necrosis: No Has patient had a PCN reaction that required hospitalization No Has patient had a PCN reaction occurring within the last 10 years: No If all of the above answers are "NO", then may proceed with Cephalosporin use.     Antimicrobials this admission: Vancomycin 6/5 >>  Aztreonam 6/5 >>   Dose adjustments this admission:  Microbiology results:  BCx: pending  UCx: pending   Sputum:    MRSA PCR:   Thank you for allowing pharmacy to be a part of this patient's care.  Hart Robinsons A 09/08/2016 8:25 PM

## 2016-09-08 NOTE — Consult Note (Signed)
Podiatry Consult Note  Reason for Consultation:  Concern for osteomyelitis of right calcaneus.  History of Present Illness: [name] [name] is a 82 y.o. male well known to me from my private office presented to the ED this evening at the recommendation of the [facility].  Radiographs and MRI have been performed.  MRI revealed findings concerning for osteomyelitis.  His wife and son are present for today's visit.  07/21/2016:  He initially presented to my office with a wound of his right heel after he stepped on a toothpick.  He found half of the toothpick.  The wound was debrided.  A culture was performed.  He was placed on Clindamycin.  Betadine dressing was applied.  The culture revealed:  07/28/2016:  Follow-up visit.  No significant change in appearance of wound.  Clindamycin continued.  MRI ordered.  08/07/2016:  Follow-up visit.  No significant change.  MRI reviewed. No foreign body or osteomyelitis.  Placed on doxycycline.  Debridement scheduled.  08/12/2016:  Debridement performed at [facility].  08/14/2016:  Postoperative visit.  Wound packed.  Home health ordered for packing change.  08/21/2016:  Postoperative visit:  Discontinue packing.  Transition to collagen.  08/28/2016:  Postoperative visit:  Wound smaller but persistent periwound erythema extending to the medial heel.  Placed on Cipro.  Referred to [facility].   Past Medical History:  Diagnosis Date  . Atrial fibrillation ([place])   . Chronic lymphocytic leukemia ([place])   . Coronary atherosclerosis of native coronary artery    Nonobstructive  . Dysrhythmia   . Essential hypertension, benign   . Hyperlipidemia   . IgA nephropathy    Dr. Lowanda Foster  . Type 2 diabetes mellitus ([place])   . UTI (urinary tract infection) july  10 th   taking  med    Scheduled Meds: Continuous Infusions: PRN Meds:.  Allergies  Allergen Reactions  . Erythromycin Other (See Comments)  .  Penicillins Hives and Other (See Comments)    Has patient had a PCN reaction causing immediate rash, facial/tongue/throat swelling, SOB or lightheadedness with hypotension: No Has patient had a PCN reaction causing severe rash involving mucus membranes or skin necrosis: No Has patient had a PCN reaction that required hospitalization No Has patient had a PCN reaction occurring within the last 10 years: No If all of the above answers are "NO", then may proceed with Cephalosporin use.    Past Surgical History:  Procedure Laterality Date  . AMPUTATION Left 09/15/2012   Procedure: PARTIAL AMPUTATION 3rd TOE LEFT FOOT;  Surgeon: Marcheta Grammes, DPM;  Location: [facility];  Service: Orthopedics;  Laterality: Left;  . AMPUTATION Left 04/20/2013   Procedure: PARTIAL AMPUTATION 2ND TOE LEFT FOOT;  Surgeon: Marcheta Grammes, DPM;  Location: [facility];  Service: Orthopedics;  Laterality: Left;  . ANAL FISSURE REPAIR    . CATARACT EXTRACTION W/PHACO Right 09/30/2015   Procedure: CATARACT EXTRACTION PHACO AND INTRAOCULAR LENS PLACEMENT (IOC);  Surgeon: Tonny Branch, MD;  Location: [facility];  Service: Ophthalmology;  Laterality: Right;  CDE: 11.78  . CATARACT EXTRACTION W/PHACO Left 10/31/2015   Procedure: CATARACT EXTRACTION PHACO AND INTRAOCULAR LENS PLACEMENT LEFT EYE; CDE:  9.10;  Surgeon: Tonny Branch, MD;  Location: [facility];  Service: Ophthalmology;  Laterality: Left;  . COLONOSCOPY  08/19/2011   Procedure: COLONOSCOPY;  Surgeon: Rogene Houston, MD;  Location: [facility];  Service: Endoscopy;  Laterality: N/A;  930  . COLONOSCOPY N/A 09/19/2014  Procedure: COLONOSCOPY;  Surgeon: Rogene Houston, MD;  Location: [facility];  Service: Endoscopy;  Laterality: N/A;  1055  . FEMORAL HERNIA REPAIR    . INCISION AND DRAINAGE OF WOUND Right 08/12/2016   Procedure: DEBRIDEMENT WOUND RIGHT HEEL;  Surgeon: Caprice Beaver, DPM;  Location: [facility];  Service: Podiatry;  Laterality: Right;  right heel  . Open  repair left quadriceps tendon.  08/22/07   Dr. Aline Brochure  . Right total knee replacement    . TRANSURETHRAL RESECTION OF PROSTATE    . WOUND EXPLORATION Right 08/12/2016   Procedure: EXPLORATION OF WOUND FOR FOREIGN BODY RIGHT HEEL;  Surgeon: Caprice Beaver, DPM;  Location: [facility];  Service: Podiatry;  Laterality: Right;  right heel   Family History  Problem Relation Age of Onset  . Atrial fibrillation Mother    Social History:  reports that he quit smoking about 53 years ago. His smoking use included Cigarettes. He quit after 10.00 years of use. He has never used smokeless tobacco. He reports that he does not drink alcohol or use drugs.  Review of Systems: Significant for right heel pain.  Denies nausea, vomiting, fever or chills.  Physical Examination: Vital signs in last 24 hours:   Temp:  [97.6 F (36.4 C)] 97.6 F (36.4 C) (06/05 1503) Pulse Rate:  [82] 82 (06/05 1503) Resp:  [22] 22 (06/05 1503) BP: (139)/(50) 139/50 (06/05 1503) SpO2:  [96 %] 96 % (06/05 1503) Weight:  [285 lb (129.3 kg)] 285 lb (129.3 kg) (06/05 1504)  Right heel:  Wound along the plantar lateral aspect of the right heel appears continuous with medial heel wound.  The medial heel wound is new since 08/28/2016 appointment.  Heavy purulent exudate with significant periwound erythema and fluctuance of skin bridge.  The wound was not probed at bedside secondary to pain.  Patient's wife related that the Wound Care Provider was able to probe to bone.  Dorsalis pedis pulse is palpable.  Posterior tibial pulse is obscurred by edema.    Lab/Test Results:   Recent Labs  09/08/16 1545  WBC 17.3*  HGB 12.9*  HCT 39.4  PLT 226  NA 137  K 4.0  CL 100*  CO2 28  BUN 33*  CREATININE 0.92  GLUCOSE 190*  CALCIUM 9.5    Recent Results (from the past 240 hour(s))  Aerobic Culture (superficial specimen)     Status: None   Collection Time: 09/01/16  3:25 PM  Result Value Ref Range Status   Specimen Description  HEEL  Final   Special Requests NONE  Final   Gram Stain   Final    ABUNDANT WBC PRESENT,BOTH PMN AND MONONUCLEAR RARE GRAM POSITIVE COCCI IN PAIRS    Culture   Final    NORMAL SKIN FLORA Performed at [facility], [place] [address], [place], [place] [address]    Report Status 09/04/2016 FINAL  Final     Mr Foot Right Wo Contrast  Result Date: 09/08/2016 CLINICAL DATA:  Right heel pain, nonhealing ulcer to the heel. EXAM: MRI OF THE RIGHT FOREFOOT WITHOUT CONTRAST TECHNIQUE: Multiplanar, multisequence MR imaging of the right hindfoot was performed. No intravenous contrast was administered. COMPARISON:  08/03/2016 MRI, 09/08/2016 radiographs FINDINGS: Once again, no T1 weighted images performed secondary to patient discomfort. Patient refused additional imaging. Proton density and T2 fat saturated axial images in the sagittal inversion recovery sequence were able to be portably. Bones/Joint/Cartilage The posterolateral corner of the calcaneus demonstrates subtle subcortical  marrow edema and an indistinct appearance of the cortical margin, series 4, image 20 and series 5, image 22. This is deep to an area of known ulceration and findings are concerning for changes of early osteomyelitis given technical limitations of an incomplete study and lack of IV contrast. Ligaments Intact lateral and medial ligamentous complexes crossing the ankle joint. Muscles and Tendons The flexor and extensor tendons crossing the ankle joint are of normal signal intensity morphology. Achilles tendon appears intact. Soft tissues Heel ulcer measuring approximately 3.5 x 1.7 x 2 cm. IMPRESSION: Soft tissue ulceration of the heel estimated at 3.5 x 1.7 x 2 cm with underlying cortical involvement of the posterolateral corner of the calcaneus and with subcortical marrow edema noted. Findings are suspicious for developing osteomyelitis. Electronically Signed   By: Ashley Royalty M.D.   On: 09/08/2016 18:17   Dg Foot Complete  Right  Result Date: 09/08/2016 CLINICAL DATA:  Right heel ulcer worsening. EXAM: RIGHT FOOT COMPLETE - 3+ VIEW COMPARISON:  None. FINDINGS: Mild degenerative change over the first MTP joint and interphalangeal joints. No evidence of acute fracture or dislocation. No evidence of bone destruction to suggest osteomyelitis. No significant air in the soft tissues. Mild soft tissue irregularity over the posterior soft tissues of the ankle/heel compatible with known ulceration. Minimal spurring over the inferior posterior calcaneus. Small vessel atherosclerotic disease. IMPRESSION: Soft tissue changes compatible with known heel ulcer. No underlying bony abnormality. Mild degenerative changes as described. Electronically Signed   By: Marin Olp M.D.   On: 09/08/2016 17:13    Assessment: 1.  Infected, nonhealing heel wound with concern for underlying osteomyelitis of the right heel. 2.  Diabetes mellitus.  Plan: The podiatric pathology and treatment options were explained to the patient, his wife and son.  Risks of limb loss explained.  Admission per hospitalist service.  Culture performed by RN in emergency department.  Results pending.  Debridement and Wound VAC placement planned for tomorrow.  Planned procedure explained.  Order placed to obtain consent.  NPO after midnight.  Hold Coumadin.  Repeat INR in AM.  Recommend ID consult.  Will likely require IV antibiotics for 6-8 weeks given the location and MRI findings.  Defer to ID for antibiotic selection and length of treatment.  Noninvasive arterial study ordered.  Granite Godman IVAN 09/08/2016, 7:45 PM

## 2016-09-08 NOTE — H&P (Signed)
Patient Demographics:    [name] [name], is a 82 y.o. male  MRN: [id number]   DOB - [birth year]  Admit Date - 09/08/2016  Outpatient Primary MD for the patient is [name], MD   Assessment & Plan:    Principal Problem:   Rt Heel Osteomyelitis [facility]) Active Problems:   Cellulitis of heel, right    1)Rt Heel Infected Ulcers and Osteomyelitis- to OR on 09/09/16  for debridement of right heel ulcers by podiatrist Dr. Caprice Beaver, patient has allergy to penicillin, treat empirically with IV vancomycin and Aztreonan pending wound and Blood cultures. white count is 17,000, however patient has persistent leukocytosis in part due to underlying leukemia   2)Afib- INR is 2, will hold Coumadin to allow for debridement of right heel ulcers by podiatrist Dr. Caprice Beaver  3)DM- Hold Levemir Actos and Amaryl as patient will be nothing by mouth to allow for debridement of right heel ulcers by podiatrist Dr. Caprice Beaver. Allow some permissive Hyperglycemia rather than risk life-threatening hypoglycemia in a patient with unreliable oral intake. Use Novolog/Humalog Sliding scale insulin with Accu-Cheks/Fingersticks as ordered. Please restart insulin regimen as well as oral hypoglycemic agents once patient is post op and  oral intake has been established  4)Social/Dispo- patient is a full code, patient probably need wound VAC prior to discharge home, patient may also need PICC line placement for IV antibiotics  With History of - Reviewed by me  Past Medical History:  Diagnosis Date  . Atrial fibrillation ([place])   . Chronic lymphocytic leukemia ([place])   . Coronary atherosclerosis of native coronary artery    Nonobstructive  . Dysrhythmia   . Essential hypertension, benign   . Hyperlipidemia   . IgA nephropathy    Dr. Lowanda Foster  . Type 2  diabetes mellitus ([place])   . UTI (urinary tract infection) july  10 th   taking  med       Past Surgical History:  Procedure Laterality Date  . AMPUTATION Left 09/15/2012   Procedure: PARTIAL AMPUTATION 3rd TOE LEFT FOOT;  Surgeon: Marcheta Grammes, DPM;  Location: [facility];  Service: Orthopedics;  Laterality: Left;  . AMPUTATION Left 04/20/2013   Procedure: PARTIAL AMPUTATION 2ND TOE LEFT FOOT;  Surgeon: Marcheta Grammes, DPM;  Location: [facility];  Service: Orthopedics;  Laterality: Left;  . ANAL FISSURE REPAIR    . CATARACT EXTRACTION W/PHACO Right 09/30/2015   Procedure: CATARACT EXTRACTION PHACO AND INTRAOCULAR LENS PLACEMENT (IOC);  Surgeon: Tonny Branch, MD;  Location: [facility];  Service: Ophthalmology;  Laterality: Right;  CDE: 11.78  . CATARACT EXTRACTION W/PHACO Left 10/31/2015   Procedure: CATARACT EXTRACTION PHACO AND INTRAOCULAR LENS PLACEMENT LEFT EYE; CDE:  9.10;  Surgeon: Tonny Branch, MD;  Location: [facility];  Service: Ophthalmology;  Laterality: Left;  . COLONOSCOPY  08/19/2011   Procedure: COLONOSCOPY;  Surgeon: Rogene Houston, MD;  Location: [facility];  Service: Endoscopy;  Laterality: N/A;  930  .  COLONOSCOPY N/A 09/19/2014   Procedure: COLONOSCOPY;  Surgeon: Rogene Houston, MD;  Location: [facility];  Service: Endoscopy;  Laterality: N/A;  1055  . FEMORAL HERNIA REPAIR    . INCISION AND DRAINAGE OF WOUND Right 08/12/2016   Procedure: DEBRIDEMENT WOUND RIGHT HEEL;  Surgeon: Caprice Beaver, DPM;  Location: [facility];  Service: Podiatry;  Laterality: Right;  right heel  . Open repair left quadriceps tendon.  08/22/07   Dr. Aline Brochure  . Right total knee replacement    . TRANSURETHRAL RESECTION OF PROSTATE    . WOUND EXPLORATION Right 08/12/2016   Procedure: EXPLORATION OF WOUND FOR FOREIGN BODY RIGHT HEEL;  Surgeon: Caprice Beaver, DPM;  Location: [facility];  Service: Podiatry;  Laterality: Right;  right heel      Chief Complaint  Patient presents with  . Wound Check       HPI:    [name] [name]  is a 82 y.o. male, With with past medical history relevant for diabetes, atrial fibrillation on chronic anticoagulation who presents with nonhealing right heel wounds . Patient has persistent drainage from right heel wound , chills but no fevers . He has been following with wound clinic and with his podiatrist Dr. Caprice Beaver   In ED MRI of the right heel suggest osteomyelitis osteomyelitis   No cp, no sob,    Review of systems:    In addition to the HPI above,   A full 12 point Review of 10 Systems was done, except as stated above, all other Review of 10 Systems were negative.    Social History:  Reviewed by me    Social History  Substance Use Topics  . Smoking status: Former Smoker    Years: 10.00    Types: Cigarettes    Quit date: 04/07/1963  . Smokeless tobacco: Never Used  . Alcohol use No       Family History :  Reviewed by me    Family History  Problem Relation Age of Onset  . Atrial fibrillation Mother      Home Medications:   Prior to Admission medications   Medication Sig Start Date End Date Taking? Authorizing Provider  acetaminophen (TYLENOL) 650 MG CR tablet Take 650 mg by mouth every 8 (eight) hours as needed for pain.   Yes [provider]  amLODipine (NORVASC) 5 MG tablet Take 5 mg by mouth daily with breakfast.   Yes [provider]  Ascorbic Acid (VITAMIN C) 1000 MG tablet Take 1,000 mg by mouth daily with breakfast.    Yes [provider]  atorvastatin (LIPITOR) 10 MG tablet Take 10 mg by mouth every evening.   Yes [provider]  Calcium Carb-Cholecalciferol (CALCIUM 600+D) 600-800 MG-UNIT TABS Take 2 tablets by mouth daily with breakfast.   Yes [provider]  cholecalciferol (VITAMIN D) 1000 units tablet Take 1,000 Units by mouth every morning.   Yes [provider]  ciclopirox (LOPROX) 0.77 % cream Apply 1 application topically daily as needed (for rash on foot).    Yes [provider]  Coenzyme Q10 (CO Q-10) 100 MG CAPS Take 100 mg by mouth daily with breakfast.    Yes [provider]  digoxin (LANOXIN) 0.125 MG tablet Take 0.125 mg by mouth daily with breakfast.    Yes [provider]  Fish Oil-Cholecalciferol (FISH OIL + D3) 1200-1000 MG-UNIT CAPS Take 2 capsules by mouth 2 (two) times daily.   Yes [provider]  glimepiride (AMARYL) 2  MG tablet Take 2 mg by mouth daily with breakfast.  06/11/16  Yes [provider]  hydrochlorothiazide 25 MG tablet Take 25 mg by mouth daily with breakfast.    Yes [provider]  HYDROcodone-acetaminophen (NORCO) 7.5-325 MG tablet Take 1 tablet by mouth every 4 (four) hours as needed for moderate pain.   Yes [provider]  insulin aspart (NOVOLOG FLEXPEN) 100 UNIT/ML FlexPen Inject 4-18 Units into the skin 2 (two) times daily before a meal. Pt uses per sliding scale before lunch and dinner.   Yes [provider]  ipratropium (ATROVENT) 0.06 % nasal spray Place 2 sprays into both nostrils 2 (two) times daily.   Yes [provider]  LEVEMIR FLEXTOUCH 100 UNIT/ML Pen Inject 50 Units into the skin daily with breakfast. 06/04/16  Yes [provider]  lisinopril (PRINIVIL,ZESTRIL) 20 MG tablet Take 40 mg by mouth daily with supper. 07/07/16  Yes [provider]  LORazepam (ATIVAN) 1 MG tablet Take 2 mg by mouth at bedtime.    Yes [provider]  Multiple Vitamin (MULTIVITAMIN WITH MINERALS) TABS tablet Take 1 tablet by mouth daily with breakfast.   Yes [provider]  pioglitazone (ACTOS) 30 MG tablet Take 30 mg by mouth every evening.    Yes [provider]  VICTOZA 18 MG/3ML SOPN Inject 1.8 mg into the skin daily with breakfast. 06/05/16  Yes [provider]  vitamin E 400 UNIT capsule Take 400 Units by mouth daily with breakfast.    Yes [provider]  warfarin (COUMADIN) 5 MG tablet  Take 5 mg by mouth every evening.   Yes [provider]  cefdinir (OMNICEF) 300 MG capsule Take 1 capsule (300 mg total) by mouth 2 (two) times daily. Patient not taking: Reported on 08/10/2016 06/25/16   Kathie Dike, MD  clindamycin (CLEOCIN) 300 MG capsule Take 300 mg by mouth 4 (four) times daily. 09/01/16   [provider]     Allergies:     Allergies  Allergen Reactions  . Erythromycin Other (See Comments)  . Penicillins Hives and Other (See Comments)    Has patient had a PCN reaction causing immediate rash, facial/tongue/throat swelling, SOB or lightheadedness with hypotension: No Has patient had a PCN reaction causing severe rash involving mucus membranes or skin necrosis: No Has patient had a PCN reaction that required hospitalization No Has patient had a PCN reaction occurring within the last 10 years: No If all of the above answers are "NO", then may proceed with Cephalosporin use.      Physical Exam:   Vitals  Blood pressure (!) 108/52, pulse 67, temperature 97.6 F (36.4 C), temperature source Oral, resp. rate 20, height 6' (1.829 m), weight 129.3 kg (285 lb), SpO2 97 %.  Physical Examination: General appearance - alert, well appearing, and in no distress Mental status - alert, oriented to person, place, and time,  Eyes - sclera anicteric Neck - supple, no JVD elevation , Chest - clear  to auscultation bilaterally, symmetrical air movement,  Heart - S1 and S2 normal, Irregular  Abdomen - soft, nontender, nondistended, no masses or organomegaly Neurological - screening mental status exam normal, neck supple without rigidity, cranial nerves II through XII intact, DTR's normal and symmetric Extremities - right heel with 2 open wounds,  one on either side with purulent drainage significant erythema and swelling, tenderness,  intact peripheral pulses     Data Review:    CBC  Recent Labs Lab  09/08/16 1545  WBC 17.3*  HGB 12.9*  HCT 39.4  PLT  226  MCV 88.5  MCH 29.0  MCHC 32.7  RDW 14.5  LYMPHSABS 9.7*  MONOABS 1.2*  EOSABS 0.7  BASOSABS 0.0   ------------------------------------------------------------------------------------------------------------------  Chemistries   Recent Labs Lab 09/08/16 1545  NA 137  K 4.0  CL 100*  CO2 28  GLUCOSE 190*  BUN 33*  CREATININE 0.92  CALCIUM 9.5  AST 19  ALT 14*  ALKPHOS 61  BILITOT 0.8   ------------------------------------------------------------------------------------------------------------------ estimated creatinine clearance is 90.5 mL/min (by C-G formula based on SCr of 0.92 mg/dL). ------------------------------------------------------------------------------------------------------------------ No results for input(s): TSH, T4TOTAL, T3FREE, THYROIDAB in the last 72 hours.  Invalid input(s): FREET3   Coagulation profile  Recent Labs Lab 09/08/16 1545  INR 2.01   ------------------------------------------------------------------------------------------------------------------- No results for input(s): DDIMER in the last 72 hours. -------------------------------------------------------------------------------------------------------------------  Cardiac Enzymes No results for input(s): CKMB, TROPONINI, MYOGLOBIN in the last 168 hours.  Invalid input(s): CK ------------------------------------------------------------------------------------------------------------------ No results found for: BNP   ---------------------------------------------------------------------------------------------------------------  Urinalysis    Component Value Date/Time   COLORURINE AMBER (A) 06/20/2016 2354   APPEARANCEUR HAZY (A) 06/20/2016 2354   LABSPEC 1.010 06/20/2016 2354   PHURINE 5.0 06/20/2016 2354   GLUCOSEU NEGATIVE 06/20/2016 2354   HGBUR SMALL (A) 06/20/2016 2354   BILIRUBINUR NEGATIVE 06/20/2016 2354   KETONESUR NEGATIVE 06/20/2016 2354   PROTEINUR  NEGATIVE 06/20/2016 2354   NITRITE NEGATIVE 06/20/2016 2354   LEUKOCYTESUR NEGATIVE 06/20/2016 2354    ----------------------------------------------------------------------------------------------------------------   Imaging Results:    Mr Foot Right Wo Contrast  Result Date: 09/08/2016 CLINICAL DATA:  Right heel pain, nonhealing ulcer to the heel. EXAM: MRI OF THE RIGHT FOREFOOT WITHOUT CONTRAST TECHNIQUE: Multiplanar, multisequence MR imaging of the right hindfoot was performed. No intravenous contrast was administered. COMPARISON:  08/03/2016 MRI, 09/08/2016 radiographs FINDINGS: Once again, no T1 weighted images performed secondary to patient discomfort. Patient refused additional imaging. Proton density and T2 fat saturated axial images in the sagittal inversion recovery sequence were able to be portably. Bones/Joint/Cartilage The posterolateral corner of the calcaneus demonstrates subtle subcortical marrow edema and an indistinct appearance of the cortical margin, series 4, image 20 and series 5, image 22. This is deep to an area of known ulceration and findings are concerning for changes of early osteomyelitis given technical limitations of an incomplete study and lack of IV contrast. Ligaments Intact lateral and medial ligamentous complexes crossing the ankle joint. Muscles and Tendons The flexor and extensor tendons crossing the ankle joint are of normal signal intensity morphology. Achilles tendon appears intact. Soft tissues Heel ulcer measuring approximately 3.5 x 1.7 x 2 cm. IMPRESSION: Soft tissue ulceration of the heel estimated at 3.5 x 1.7 x 2 cm with underlying cortical involvement of the posterolateral corner of the calcaneus and with subcortical marrow edema noted. Findings are suspicious for developing osteomyelitis. Electronically Signed   By: Ashley Royalty M.D.   On: 09/08/2016 18:17   Dg Foot Complete Right  Result Date: 09/08/2016 CLINICAL DATA:  Right heel ulcer worsening. EXAM:  RIGHT FOOT COMPLETE - 3+ VIEW COMPARISON:  None. FINDINGS: Mild degenerative change over the first MTP joint and interphalangeal joints. No evidence of acute fracture or dislocation. No evidence of bone destruction to suggest osteomyelitis. No significant air in the soft tissues. Mild soft tissue irregularity over the posterior soft tissues of the ankle/heel compatible with known ulceration. Minimal spurring over the inferior posterior calcaneus. Small vessel atherosclerotic disease. IMPRESSION: Soft tissue  changes compatible with known heel ulcer. No underlying bony abnormality. Mild degenerative changes as described. Electronically Signed   By: Marin Olp M.D.   On: 09/08/2016 17:13    Radiological Exams on Admission: Mr Foot Right Wo Contrast  Result Date: 09/08/2016 CLINICAL DATA:  Right heel pain, nonhealing ulcer to the heel. EXAM: MRI OF THE RIGHT FOREFOOT WITHOUT CONTRAST TECHNIQUE: Multiplanar, multisequence MR imaging of the right hindfoot was performed. No intravenous contrast was administered. COMPARISON:  08/03/2016 MRI, 09/08/2016 radiographs FINDINGS: Once again, no T1 weighted images performed secondary to patient discomfort. Patient refused additional imaging. Proton density and T2 fat saturated axial images in the sagittal inversion recovery sequence were able to be portably. Bones/Joint/Cartilage The posterolateral corner of the calcaneus demonstrates subtle subcortical marrow edema and an indistinct appearance of the cortical margin, series 4, image 20 and series 5, image 22. This is deep to an area of known ulceration and findings are concerning for changes of early osteomyelitis given technical limitations of an incomplete study and lack of IV contrast. Ligaments Intact lateral and medial ligamentous complexes crossing the ankle joint. Muscles and Tendons The flexor and extensor tendons crossing the ankle joint are of normal signal intensity morphology. Achilles tendon appears intact.  Soft tissues Heel ulcer measuring approximately 3.5 x 1.7 x 2 cm. IMPRESSION: Soft tissue ulceration of the heel estimated at 3.5 x 1.7 x 2 cm with underlying cortical involvement of the posterolateral corner of the calcaneus and with subcortical marrow edema noted. Findings are suspicious for developing osteomyelitis. Electronically Signed   By: Ashley Royalty M.D.   On: 09/08/2016 18:17   Dg Foot Complete Right  Result Date: 09/08/2016 CLINICAL DATA:  Right heel ulcer worsening. EXAM: RIGHT FOOT COMPLETE - 3+ VIEW COMPARISON:  None. FINDINGS: Mild degenerative change over the first MTP joint and interphalangeal joints. No evidence of acute fracture or dislocation. No evidence of bone destruction to suggest osteomyelitis. No significant air in the soft tissues. Mild soft tissue irregularity over the posterior soft tissues of the ankle/heel compatible with known ulceration. Minimal spurring over the inferior posterior calcaneus. Small vessel atherosclerotic disease. IMPRESSION: Soft tissue changes compatible with known heel ulcer. No underlying bony abnormality. Mild degenerative changes as described. Electronically Signed   By: Marin Olp M.D.   On: 09/08/2016 17:13    DVT Prophylaxis -SCD  /Coumadin AM Labs Ordered, also please review Full Orders  Family Communication: Admission, patients condition and plan of care including tests being ordered have been discussed with the patient and son/wife who indicate understanding and agree with the plan   Code Status - Full Code  Likely DC to  Home with wound VAC and PICC line  Condition   stable  Jeanmarie Mccowen M.D on 09/08/2016 at 10:43 PM   Between 7am to 7pm - Pager - [phone]  After 7pm go to [email or web address] - password [email or web address]  [facility] - Office  [phone]  Voice Recognition Viviann Spare dictation system was used to create this note, attempts have been made to correct errors. Please contact the author with questions and/or  clarifications.

## 2016-09-09 ENCOUNTER — Inpatient Hospital Stay (HOSPITAL_COMMUNITY): Payer: Medicare Other

## 2016-09-09 ENCOUNTER — Inpatient Hospital Stay (HOSPITAL_COMMUNITY): Payer: Medicare Other | Admitting: Anesthesiology

## 2016-09-09 ENCOUNTER — Encounter (HOSPITAL_COMMUNITY): Payer: Self-pay | Admitting: *Deleted

## 2016-09-09 ENCOUNTER — Encounter (HOSPITAL_COMMUNITY)
Admission: EM | Disposition: A | Discharge status: Skilled Nursing Facility | Payer: Self-pay | Source: Home / Self Care | Attending: Family Medicine

## 2016-09-09 DIAGNOSIS — E118 Type 2 diabetes mellitus with unspecified complications: Secondary | ICD-10-CM

## 2016-09-09 DIAGNOSIS — M86171 Other acute osteomyelitis, right ankle and foot: Secondary | ICD-10-CM

## 2016-09-09 DIAGNOSIS — L03115 Cellulitis of right lower limb: Secondary | ICD-10-CM

## 2016-09-09 HISTORY — PX: APPLICATION OF WOUND VAC: SHX5189

## 2016-09-09 HISTORY — PX: INCISION AND DRAINAGE: SHX5863

## 2016-09-09 LAB — BASIC METABOLIC PANEL
Anion gap: 8 (ref 5–15)
BUN: 32 mg/dL — AB (ref 6–20)
CO2: 28 mmol/L (ref 22–32)
CREATININE: 0.84 mg/dL (ref 0.61–1.24)
Calcium: 9.2 mg/dL (ref 8.9–10.3)
Chloride: 99 mmol/L — ABNORMAL LOW (ref 101–111)
GFR calc Af Amer: >60 mL/min (ref >60)
GFR calc non Af Amer: >60 mL/min (ref >60)
GLUCOSE: 243 mg/dL — AB (ref 65–99)
Potassium: 3.8 mmol/L (ref 3.5–5.1)
Sodium: 135 mmol/L (ref 135–145)

## 2016-09-09 LAB — GLUCOSE, CAPILLARY
GLUCOSE-CAPILLARY: 211 mg/dL — AB (ref 65–99)
GLUCOSE-CAPILLARY: 183 mg/dL — AB (ref 65–99)
Glucose-Capillary: 258 mg/dL — ABNORMAL HIGH (ref 65–99)
Glucose-Capillary: 249 mg/dL — ABNORMAL HIGH (ref 65–99)

## 2016-09-09 LAB — CBC
HCT: 39 % (ref 39.0–52.0)
Hemoglobin: 12.8 g/dL — ABNORMAL LOW (ref 13.0–17.0)
MCH: 29.2 pg (ref 26.0–34.0)
MCHC: 32.8 g/dL (ref 30.0–36.0)
MCV: 89 fL (ref 78.0–100.0)
PLATELETS: 226 10*3/uL (ref 150–400)
RBC: 4.38 MIL/uL (ref 4.22–5.81)
RDW: 14.6 % (ref 11.5–15.5)
WBC: 16 10*3/uL — ABNORMAL HIGH (ref 4.0–10.5)

## 2016-09-09 LAB — PROTIME-INR
INR: 1.94
Prothrombin Time: 22.4 seconds — ABNORMAL HIGH (ref 11.4–15.2)

## 2016-09-09 SURGERY — INCISION AND DRAINAGE
Anesthesia: Monitor Anesthesia Care | Laterality: Right

## 2016-09-09 MED ORDER — FENTANYL CITRATE (PF) 100 MCG/2ML IJ SOLN
25.0000 ug | Freq: Once | INTRAMUSCULAR | Status: AC
  Administered 2016-09-09: 25 ug via INTRAVENOUS

## 2016-09-09 MED ORDER — BUPIVACAINE HCL (PF) 0.5 % IJ SOLN
INTRAMUSCULAR | Status: DC | PRN
  Administered 2016-09-09: 20 mL

## 2016-09-09 MED ORDER — PROPOFOL 500 MG/50ML IV EMUL
INTRAVENOUS | Status: DC | PRN
  Administered 2016-09-09: 80 ug/kg/min via INTRAVENOUS
  Administered 2016-09-09: 55 ug/kg/min via INTRAVENOUS
  Administered 2016-09-09: 75 ug/kg/min via INTRAVENOUS

## 2016-09-09 MED ORDER — FENTANYL CITRATE (PF) 100 MCG/2ML IJ SOLN
INTRAMUSCULAR | Status: AC
  Filled 2016-09-09: qty 2

## 2016-09-09 MED ORDER — DEXTROSE 5 % IV SOLN
2.0000 g | Freq: Three times a day (TID) | INTRAVENOUS | Status: DC
  Administered 2016-09-09 – 2016-09-11 (×6): 2 g via INTRAVENOUS
  Filled 2016-09-09 (×15): qty 2

## 2016-09-09 MED ORDER — BUPIVACAINE HCL (PF) 0.5 % IJ SOLN
INTRAMUSCULAR | Status: AC
  Filled 2016-09-09: qty 30

## 2016-09-09 MED ORDER — SODIUM CHLORIDE 0.9 % IR SOLN
Status: DC | PRN
  Administered 2016-09-09: 3000 mL

## 2016-09-09 MED ORDER — AZTREONAM 1 G IJ SOLR
1.0000 g | Freq: Three times a day (TID) | INTRAMUSCULAR | Status: DC
  Administered 2016-09-09: 1 g via INTRAVENOUS
  Filled 2016-09-09 (×2): qty 1

## 2016-09-09 MED ORDER — INSULIN GLARGINE 100 UNIT/ML ~~LOC~~ SOLN
10.0000 [IU] | Freq: Every day | SUBCUTANEOUS | Status: DC
  Administered 2016-09-09: 10 [IU] via SUBCUTANEOUS
  Filled 2016-09-09 (×2): qty 0.1

## 2016-09-09 MED ORDER — DEXTROSE 5 % IV SOLN
1.0000 g | Freq: Three times a day (TID) | INTRAVENOUS | Status: DC
  Administered 2016-09-09 (×2): 1 g via INTRAVENOUS
  Filled 2016-09-09 (×5): qty 1

## 2016-09-09 MED ORDER — PROPOFOL 10 MG/ML IV BOLUS
INTRAVENOUS | Status: DC | PRN
  Administered 2016-09-09: 10 mg via INTRAVENOUS
  Administered 2016-09-09 (×3): 20 mg via INTRAVENOUS
  Administered 2016-09-09: 10 mg via INTRAVENOUS
  Administered 2016-09-09: 20 mg via INTRAVENOUS

## 2016-09-09 MED ORDER — CLINDAMYCIN PHOSPHATE 600 MG/50ML IV SOLN
600.0000 mg | Freq: Once | INTRAVENOUS | Status: AC
  Administered 2016-09-09: 600 mg via INTRAVENOUS
  Filled 2016-09-09 (×2): qty 50

## 2016-09-09 MED ORDER — PROPOFOL 10 MG/ML IV BOLUS
INTRAVENOUS | Status: AC
  Filled 2016-09-09: qty 20

## 2016-09-09 MED ORDER — MIDAZOLAM HCL 2 MG/2ML IJ SOLN
1.0000 mg | INTRAMUSCULAR | Status: DC
  Administered 2016-09-09: 2 mg via INTRAVENOUS

## 2016-09-09 MED ORDER — LIDOCAINE HCL 1 % IJ SOLN
INTRAMUSCULAR | Status: DC | PRN
  Administered 2016-09-09: 20 mL

## 2016-09-09 MED ORDER — MIDAZOLAM HCL 2 MG/2ML IJ SOLN
INTRAMUSCULAR | Status: AC
  Filled 2016-09-09: qty 2

## 2016-09-09 MED ORDER — LIDOCAINE HCL (PF) 1 % IJ SOLN
INTRAMUSCULAR | Status: AC
  Filled 2016-09-09: qty 30

## 2016-09-09 MED ORDER — FENTANYL CITRATE (PF) 100 MCG/2ML IJ SOLN
25.0000 ug | INTRAMUSCULAR | Status: DC | PRN
  Administered 2016-09-09 (×3): 50 ug via INTRAVENOUS
  Filled 2016-09-09 (×2): qty 2

## 2016-09-09 MED ORDER — CHLORHEXIDINE GLUCONATE CLOTH 2 % EX PADS
6.0000 | MEDICATED_PAD | Freq: Once | CUTANEOUS | Status: AC
  Administered 2016-09-09: 6 via TOPICAL

## 2016-09-09 MED ORDER — LACTATED RINGERS IV SOLN
INTRAVENOUS | Status: DC
  Administered 2016-09-09: 10:00:00 via INTRAVENOUS

## 2016-09-09 MED ORDER — AZTREONAM 1 G IJ SOLR
INTRAMUSCULAR | Status: AC
  Filled 2016-09-09: qty 1

## 2016-09-09 MED ORDER — INSULIN ASPART 100 UNIT/ML ~~LOC~~ SOLN
5.0000 [IU] | Freq: Three times a day (TID) | SUBCUTANEOUS | Status: DC
  Administered 2016-09-09 – 2016-09-11 (×7): 5 [IU] via SUBCUTANEOUS

## 2016-09-09 SURGICAL SUPPLY — 47 items
APL SKNCLS STERI-STRIP NONHPOA (GAUZE/BANDAGES/DRESSINGS) ×1
BAG HAMPER (MISCELLANEOUS) ×2 IMPLANT
BANDAGE ELASTIC 4 VELCRO NS (GAUZE/BANDAGES/DRESSINGS) ×2 IMPLANT
BANDAGE ESMARK 4X12 BL STRL LF (DISPOSABLE) ×1 IMPLANT
BENZOIN TINCTURE PRP APPL 2/3 (GAUZE/BANDAGES/DRESSINGS) ×2 IMPLANT
BLADE SURG 15 STRL LF DISP TIS (BLADE) ×2 IMPLANT
BLADE SURG 15 STRL SS (BLADE) ×4
BNDG CMPR 12X4 ELC STRL LF (DISPOSABLE) ×1
BNDG CONFORM 2 STRL LF (GAUZE/BANDAGES/DRESSINGS) ×2 IMPLANT
BNDG ESMARK 4X12 BLUE STRL LF (DISPOSABLE) ×2
BNDG GAUZE ELAST 4 BULKY (GAUZE/BANDAGES/DRESSINGS) ×2 IMPLANT
CANISTER WOUND CARE 500ML ATS (WOUND CARE) ×2 IMPLANT
CLOTH BEACON ORANGE TIMEOUT ST (SAFETY) ×2 IMPLANT
COVER LIGHT HANDLE STERIS (MISCELLANEOUS) ×4 IMPLANT
CUFF TOURNIQUET SINGLE 18IN (TOURNIQUET CUFF) ×1 IMPLANT
CUFF TOURNIQUET SINGLE 24IN (TOURNIQUET CUFF) IMPLANT
DECANTER SPIKE VIAL GLASS SM (MISCELLANEOUS) ×2 IMPLANT
DRSG ADAPTIC 3X8 NADH LF (GAUZE/BANDAGES/DRESSINGS) ×2 IMPLANT
DRSG VAC ATS MED SENSATRAC (GAUZE/BANDAGES/DRESSINGS) ×2 IMPLANT
DRSG VAC ATS SM SENSATRAC (GAUZE/BANDAGES/DRESSINGS) ×1 IMPLANT
ELECT REM PT RETURN 9FT ADLT (ELECTROSURGICAL) ×2
ELECTRODE REM PT RTRN 9FT ADLT (ELECTROSURGICAL) ×1 IMPLANT
GAUZE SPONGE 4X4 12PLY STRL (GAUZE/BANDAGES/DRESSINGS) ×2 IMPLANT
GLOVE BIO SURGEON STRL SZ7.5 (GLOVE) ×2 IMPLANT
GLOVE BIOGEL PI IND STRL 6.5 (GLOVE) ×1 IMPLANT
GLOVE BIOGEL PI IND STRL 7.0 (GLOVE) ×1 IMPLANT
GLOVE BIOGEL PI INDICATOR 6.5 (GLOVE) ×1
GLOVE BIOGEL PI INDICATOR 7.0 (GLOVE) ×1
GLOVE ECLIPSE 7.0 STRL STRAW (GLOVE) ×2 IMPLANT
GOWN STRL REUS W/ TWL LRG LVL3 (GOWN DISPOSABLE) ×1 IMPLANT
GOWN STRL REUS W/TWL LRG LVL3 (GOWN DISPOSABLE) ×6 IMPLANT
KIT ROOM TURNOVER AP CYSTO (KITS) ×2 IMPLANT
KIT ROOM TURNOVER APOR (KITS) ×2 IMPLANT
MANIFOLD NEPTUNE II (INSTRUMENTS) ×2 IMPLANT
MARKER SKIN DUAL TIP RULER LAB (MISCELLANEOUS) ×2 IMPLANT
NDL HYPO 27GX1-1/4 (NEEDLE) ×1 IMPLANT
NEEDLE HYPO 27GX1-1/4 (NEEDLE) ×2 IMPLANT
NS IRRIG 1000ML POUR BTL (IV SOLUTION) ×2 IMPLANT
PACK BASIC LIMB (CUSTOM PROCEDURE TRAY) ×2 IMPLANT
PAD ARMBOARD 7.5X6 YLW CONV (MISCELLANEOUS) ×2 IMPLANT
SET BASIN LINEN APH (SET/KITS/TRAYS/PACK) ×2 IMPLANT
SPONGE LAP 18X18 X RAY DECT (DISPOSABLE) ×2 IMPLANT
STRIP CLOSURE SKIN 1/2X4 (GAUZE/BANDAGES/DRESSINGS) ×2 IMPLANT
SUT MON AB 2-0 CT1 36 (SUTURE) ×2 IMPLANT
SUT VIC AB 4-0 PS2 27 (SUTURE) ×2 IMPLANT
SUT VICRYL AB 3-0 FS1 BRD 27IN (SUTURE) ×2 IMPLANT
SYR CONTROL 10ML LL (SYRINGE) ×4 IMPLANT

## 2016-09-09 NOTE — Transfer of Care (Signed)
Immediate Anesthesia Transfer of Care Note  Patient: [name] [name]  Procedure(s) Performed: Procedure(s): DEBRIDEMENT WOUND RT heel with wound vac attachment (Right) APPLICATION OF WOUND VAC (Right)  Patient Location: PACU  Anesthesia Type:MAC  Level of Consciousness: awake  Airway & Oxygen Therapy: Patient Spontanous Breathing and Patient connected to nasal cannula oxygen  Post-op Assessment: Report given to RN and Post -op Vital signs reviewed and stable  Post vital signs: Reviewed and stable  Last Vitals:  Vitals:   09/09/16 1000 09/09/16 1015  BP: (!) 116/50 (!) 113/46  Pulse:    Resp: 19 17  Temp:      Last Pain:  Vitals:   09/09/16 0858  TempSrc: Oral  PainSc:       Patients Stated Pain Goal: 5 (35/68/61 6837)  Complications: No apparent anesthesia complications

## 2016-09-09 NOTE — Brief Op Note (Addendum)
BRIEF OPERATIVE NOTE  DATE OF PROCEDURE 09/09/2016  SURGEON Marcheta Grammes, DPM  ASSISTANT SURGEON None  OR STAFF Circulator: Jayme Cloud, RN Scrub Person: Lucie Leather, CST   PREOPERATIVE DIAGNOSIS 1.  Nonhealing puncture wound of the right heel 2.  Osteomyelitis right heel 3.  Diabetes mellitus  POSTOPERATIVE DIAGNOSIS Same  PROCEDURE 1.  Debridement of skin, subcutaneous tissue and fascia, right heel 2.  Application of Wound VAC, right heel  ANESTHESIA MAC with local  HEMOSTASIS Pneumatic calf tourniquet set at 250 mmHg  ESTIMATED BLOOD LOSS <25 cc  MATERIALS USED Wound VAC with 2 pieces of sponge (1 white sponge and 1 black sponge)  INJECTABLES 0.5% Marcaine plain 1% Lidocaine plain  PATHOLOGY Aerobic culture Anaerobic culture  COMPLICATIONS None

## 2016-09-09 NOTE — Progress Notes (Signed)
  PROGRESS NOTE  [name] [name]:[id number] DOB: 1935-01-26 DOA: 09/08/2016 PCP: [name], MD  Brief Narrative: 82 year old man PMH diabetes, atrial fibrillation on anticoagulation, presented with nonhealing right heel ulcer. MRI revealed osteomyelitis.  Assessment/Plan #1: Right heel osteomyelitis secondary to nonhealing ulcer, complicated by diabetes. -Stable status post heel debridement and application of wound VAC -Change to cefepime, continue vancomycin. -Follow-up culture data  #2: Diabetes mellitus. Hold Actos, Amaryl. -Stable. -Resume insulin Lantus 10 units daily, meal coverage 5 units 3 times a day  #3: Atrial fibrillation. Stable. Warfarin on hold. Resume when cleared by podiatry.  DVT prophylaxis: SCDs Code Status: full Family Communication: wife at bedside Disposition Plan: home    Murray Hodgkins, MD  [facility] Direct contact: [phone] --[address] [place] [address]  --[email or web address]; password [email or web address]  7PM-7AM contact night coverage as above 09/09/2016, 3:29 PM  LOS: 1 day   Consultants:  Podiatry   Procedures: 1.  Debridement of skin, subcutaneous tissue and fascia, right heel 2.  Application of Wound VAC, right heel  Antimicrobials:  Cefepime 6/6 >>  Vancomycin 6/5 >>  Interval history/Subjective: Feels fine postsurgery.  Objective: Vitals:  Afebrile, temperature 97.7, respirations 18, pulse 59, blood pressure 114/39  Exam:     Constitutional: Appears calm, comfortable lying in bed  Eyes: Pupils, irises, lids appear normal  ENT: Lips tongue appear unremarkable. Slightly hard of hearing.  Cardiovascular: Regular rate and rhythm. No murmur, rub or gallop. No significant lower extremity edema.  Respiratory: Clear to auscultation bilaterally. No wheezes, rales or rhonchi. Normal respiratory effort.  Psychiatric: Grossly normal mood and affect. Speech fluent and appropriate.  Musculoskeletal: Moves bilateral upper extremities.   I have  personally reviewed the following:   Labs:  Blood sugars stable  Basic metabolic panel notable for hyperglycemia, otherwise unremarkable  WBC without change, 16.0, hemoglobin 12.8  INR 1.94  Wound and blood cultures pending  Imaging studies:  MRI right foot noted  Medical tests:     Test discussed with performing physician:    Decision to obtain old records:    Review and summation of old records:    Scheduled Meds: . amLODipine  5 mg Oral Q breakfast  . atorvastatin  10 mg Oral QPM  . calcium-vitamin D  2 tablet Oral Q breakfast  . cholecalciferol  1,000 Units Oral q morning - 10a  . digoxin  0.125 mg Oral Q breakfast  . insulin aspart  0-9 Units Subcutaneous TID WC  . ipratropium  2 spray Each Nare BID  . liraglutide  1.8 mg Subcutaneous Q breakfast  . lisinopril  40 mg Oral Q supper  . LORazepam  2 mg Oral QHS  . multivitamin with minerals  1 tablet Oral Q breakfast  . omega-3 acid ethyl esters  2 capsule Oral BID  . senna  1 tablet Oral BID  . sodium chloride flush  3 mL Intravenous Q12H  . vitamin C  1,000 mg Oral Q breakfast  . vitamin E  400 Units Oral Q breakfast   Continuous Infusions: . sodium chloride    . aztreonam 1 g (09/09/16 1025)  . lactated ringers 75 mL/hr at 09/09/16 0944  . vancomycin 1,000 mg (09/09/16 0939)    Principal Problem:   Rt Heel Osteomyelitis ([facility]) Active Problems:   Cellulitis of heel, right   LOS: 1 day

## 2016-09-09 NOTE — Anesthesia Procedure Notes (Signed)
Procedure Name: MAC Date/Time: 09/09/2016 10:44 AM Performed by: Vista Deck Pre-anesthesia Checklist: Patient identified, Emergency Drugs available, Suction available, Timeout performed and Patient being monitored Patient Re-evaluated:Patient Re-evaluated prior to inductionOxygen Delivery Method: Nasal Cannula

## 2016-09-09 NOTE — Progress Notes (Signed)
Podiatry Progress Note  Subjective [name] [name] scheduled for debridement of right heel with Wound VAC placement today.  Feeling "pretty good".  He has been NPO since this morning.  Wife present at bedside.  Objective Vital signs in last 24 hours:   Temp:  [97.6 F (36.4 C)-98.1 F (36.7 C)] 97.6 F (36.4 C) (06/06 0858) Pulse Rate:  [67-82] 69 (06/06 0858) Resp:  [20-22] 20 (06/06 0858) BP: (108-139)/(50-54) 130/53 (06/06 0858) SpO2:  [96 %-98 %] 98 % (06/06 0858) Weight:  [285 lb (129.3 kg)-286 lb 3.2 oz (129.8 kg)] 286 lb 3.2 oz (129.8 kg) (06/05 2300)  Dressing intact.  No strikethrough.  Capillary refill brisk.  Lab/Test Results  Recent Labs  09/08/16 1545 09/09/16 0514  WBC 17.3* 16.0*  HGB 12.9* 12.8*  HCT 39.4 39.0  PLT 226 226  NA 137 135  K 4.0 3.8  CL 100* 99*  CO2 28 28  BUN 33* 32*  CREATININE 0.92 0.84  GLUCOSE 190* 243*  CALCIUM 9.5 9.2    Recent Results (from the past 240 hour(s))  Aerobic Culture (superficial specimen)     Status: None   Collection Time: 09/01/16  3:25 PM  Result Value Ref Range Status   Specimen Description HEEL  Final   Special Requests NONE  Final   Gram Stain   Final    ABUNDANT WBC PRESENT,BOTH PMN AND MONONUCLEAR RARE GRAM POSITIVE COCCI IN PAIRS    Culture   Final    NORMAL SKIN FLORA Performed at [facility], [address], [place], [place] [address]    Report Status 09/04/2016 FINAL  Final  Wound or Superficial Culture     Status: None (Preliminary result)   Collection Time: 09/08/16  3:50 PM  Result Value Ref Range Status   Specimen Description FOOT RIGHT  Final   Special Requests NONE  Final   Gram Stain   Final    ABUNDANT WBC PRESENT, PREDOMINANTLY PMN ABUNDANT GRAM POSITIVE COCCI IN PAIRS IN CHAINS RARE GRAM POSITIVE RODS Performed at [facility], [place] [address] [place], [place] [address]    Culture PENDING  Incomplete   Report Status PENDING  Incomplete  Culture, blood (Routine X 2) w  Reflex to ID Panel     Status: None (Preliminary result)   Collection Time: 09/08/16 10:19 PM  Result Value Ref Range Status   Specimen Description BLOOD RIGHT HAND  Final   Special Requests Blood Culture adequate volume  Final   Culture NO GROWTH < 12 HOURS  Final   Report Status PENDING  Incomplete  Culture, blood (Routine X 2) w Reflex to ID Panel     Status: None (Preliminary result)   Collection Time: 09/08/16 10:24 PM  Result Value Ref Range Status   Specimen Description LEFT ANTECUBITAL  Final   Special Requests Blood Culture adequate volume  Final   Culture NO GROWTH < 12 HOURS  Final   Report Status PENDING  Incomplete     Mr Foot Right Wo Contrast  Result Date: 09/08/2016 CLINICAL DATA:  Right heel pain, nonhealing ulcer to the heel. EXAM: MRI OF THE RIGHT FOREFOOT WITHOUT CONTRAST TECHNIQUE: Multiplanar, multisequence MR imaging of the right hindfoot was performed. No intravenous contrast was administered. COMPARISON:  08/03/2016 MRI, 09/08/2016 radiographs FINDINGS: Once again, no T1 weighted images performed secondary to patient discomfort. Patient refused additional imaging. Proton density and T2 fat saturated axial images in the sagittal inversion recovery sequence were able  to be portably. Bones/Joint/Cartilage The posterolateral corner of the calcaneus demonstrates subtle subcortical marrow edema and an indistinct appearance of the cortical margin, series 4, image 20 and series 5, image 22. This is deep to an area of known ulceration and findings are concerning for changes of early osteomyelitis given technical limitations of an incomplete study and lack of IV contrast. Ligaments Intact lateral and medial ligamentous complexes crossing the ankle joint. Muscles and Tendons The flexor and extensor tendons crossing the ankle joint are of normal signal intensity morphology. Achilles tendon appears intact. Soft tissues Heel ulcer measuring approximately 3.5 x 1.7 x 2 cm. IMPRESSION: Soft  tissue ulceration of the heel estimated at 3.5 x 1.7 x 2 cm with underlying cortical involvement of the posterolateral corner of the calcaneus and with subcortical marrow edema noted. Findings are suspicious for developing osteomyelitis. Electronically Signed   By: Ashley Royalty M.D.   On: 09/08/2016 18:17   Dg Foot Complete Right  Result Date: 09/08/2016 CLINICAL DATA:  Right heel ulcer worsening. EXAM: RIGHT FOOT COMPLETE - 3+ VIEW COMPARISON:  None. FINDINGS: Mild degenerative change over the first MTP joint and interphalangeal joints. No evidence of acute fracture or dislocation. No evidence of bone destruction to suggest osteomyelitis. No significant air in the soft tissues. Mild soft tissue irregularity over the posterior soft tissues of the ankle/heel compatible with known ulceration. Minimal spurring over the inferior posterior calcaneus. Small vessel atherosclerotic disease. IMPRESSION: Soft tissue changes compatible with known heel ulcer. No underlying bony abnormality. Mild degenerative changes as described. Electronically Signed   By: Marin Olp M.D.   On: 09/08/2016 17:13    Medications Scheduled Meds: . [MAR Hold] amLODipine  5 mg Oral Q breakfast  . [MAR Hold] atorvastatin  10 mg Oral QPM  . [MAR Hold] calcium-vitamin D  2 tablet Oral Q breakfast  . [MAR Hold] cholecalciferol  1,000 Units Oral q morning - 10a  . [MAR Hold] digoxin  0.125 mg Oral Q breakfast  . [MAR Hold] insulin aspart  0-9 Units Subcutaneous TID WC  . [MAR Hold] ipratropium  2 spray Each Nare BID  . [MAR Hold] liraglutide  1.8 mg Subcutaneous Q breakfast  . [MAR Hold] lisinopril  40 mg Oral Q supper  . [MAR Hold] LORazepam  2 mg Oral QHS  . [MAR Hold] multivitamin with minerals  1 tablet Oral Q breakfast  . [MAR Hold] omega-3 acid ethyl esters  2 capsule Oral BID  . [MAR Hold] senna  1 tablet Oral BID  . [MAR Hold] sodium chloride flush  3 mL Intravenous Q12H  . [MAR Hold] vitamin C  1,000 mg Oral Q breakfast   . [MAR Hold] vitamin E  400 Units Oral Q breakfast   Continuous Infusions: . [MAR Hold] sodium chloride    . [MAR Hold] aztreonam    . clindamycin (CLEOCIN) IV    . [MAR Hold] vancomycin Stopped (09/09/16 0025)   PRN Meds:.[MAR Hold] sodium chloride, [MAR Hold] acetaminophen **OR** [MAR Hold] acetaminophen, [MAR Hold] albuterol, [MAR Hold] ciclopirox, [MAR Hold] HYDROcodone-acetaminophen, [MAR Hold] ondansetron **OR** [MAR Hold] ondansetron (ZOFRAN) IV, [MAR Hold] polyethylene glycol, [MAR Hold] sodium chloride flush, [MAR Hold] traZODone  Assessment 1.  Infected wound, right heel with underlying osteomyelitis. 2.  Diabetes mellitus.  Plan Proceed with debridement as planned.  Consent signed and on chart.  [name] [name] IVAN 09/09/2016, 9:36 AM

## 2016-09-09 NOTE — Anesthesia Procedure Notes (Signed)
Procedure Name: MAC Date/Time: 09/09/2016 11:01 AM Performed by: Vista Deck Pre-anesthesia Checklist: Patient identified, Emergency Drugs available, Suction available, Timeout performed and Patient being monitored Patient Re-evaluated:Patient Re-evaluated prior to inductionOxygen Delivery Method: Non-rebreather mask

## 2016-09-09 NOTE — Anesthesia Preprocedure Evaluation (Signed)
Anesthesia Evaluation  Patient identified by MRN, date of birth, ID band Patient awake    Reviewed: Allergy & Precautions, NPO status , Patient's Chart, lab work & pertinent test results  Airway Mallampati: II  TM Distance: >3 FB Neck ROM: Full    Dental  (+) Partial Upper   Pulmonary former smoker,    breath sounds clear to auscultation       Cardiovascular hypertension, Pt. on medications + CAD  + dysrhythmias Atrial Fibrillation  Rhythm:Regular Rate:Normal     Neuro/Psych negative neurological ROS  negative psych ROS   GI/Hepatic negative GI ROS, Neg liver ROS,   Endo/Other  diabetes, Type 2, Insulin Dependent, Oral Hypoglycemic Agents  Renal/GU Renal disease     Musculoskeletal negative musculoskeletal ROS (+)   Abdominal   Peds negative pediatric ROS (+)  Hematology negative hematology ROS (+)   Anesthesia Other Findings   Reproductive/Obstetrics                             Anesthesia Physical Anesthesia Plan  ASA: III  Anesthesia Plan: MAC   Post-op Pain Management:    Induction: Intravenous  PONV Risk Score and Plan:   Airway Management Planned: Natural Airway and Simple Face Mask  Additional Equipment:   Intra-op Plan:   Post-operative Plan:   Informed Consent: I have reviewed the patients History and Physical, chart, labs and discussed the procedure including the risks, benefits and alternatives for the proposed anesthesia with the patient or authorized representative who has indicated his/her understanding and acceptance.     Plan Discussed with: CRNA  Anesthesia Plan Comments:         Anesthesia Quick Evaluation

## 2016-09-09 NOTE — Progress Notes (Addendum)
HRISTOPHER, MISSILDINE ([id number]) Visit Report for 09/08/2016 Arrival Information Details Patient Name: [name] [name], [name] [name]. Date of Service: 09/08/2016 12:30 PM Medical Record Number: [id number] Patient Account Number: [id number] Date of Birth/Sex: 07/27/1934 (82 y.o. Male) Treating RN: Montey Hora Primary Care Cecillia Menees: [facility], [place] Other Clinician: Referring Adilene Areola: [facility], [place] Treating Aaric Dolph/Extender: Tito Dine in Treatment: 1 Visit Information History Since Last Visit Added or deleted any medications: No Patient Arrived: Walker Any new allergies or adverse reactions: No Arrival Time: 12:30 Had a fall or experienced change in No Accompanied By: spouse activities of daily living that may affect Transfer Assistance: None risk of falls: Patient Identification Verified: Yes Signs or symptoms of abuse/neglect since last No Secondary Verification Process Yes visito Completed: Hospitalized since last visit: No Patient Has Alerts: Yes Has Dressing in Place as Prescribed: Yes Patient Alerts: Patient on Blood Pain Present Now: No Thinner warfarin DMII Electronic Signature(s) Signed: 09/08/2016 4:59:08 PM By: Montey Hora Entered By: Montey Hora on 09/08/2016 12:30:36 [name] [name] ([id number]) -------------------------------------------------------------------------------- Clinic Level of Care Assessment Details Patient Name: [name] [name] Date of Service: 09/08/2016 12:30 PM Medical Record Number: [id number] Patient Account Number: [id number] Date of Birth/Sex: May 25, 1934 (82 y.o. Male) Treating RN: Montey Hora Primary Care Noe Goyer: [facility], [place] Other Clinician: Referring Tkeya Stencil: [facility], [place] Treating Dmarco Baldus/Extender: Tito Dine in Treatment: 1 Clinic Level of Care Assessment Items TOOL 4 Quantity Score []  - Use when only an EandM is performed on FOLLOW-UP visit 0 ASSESSMENTS - Nursing Assessment / Reassessment X - Reassessment of  Co-morbidities (includes updates in patient status) 1 10 X - Reassessment of Adherence to Treatment Plan 1 5 ASSESSMENTS - Wound and Skin Assessment / Reassessment X - Simple Wound Assessment / Reassessment - one wound 1 5 []  - Complex Wound Assessment / Reassessment - multiple wounds 0 []  - Dermatologic / Skin Assessment (not related to wound area) 0 ASSESSMENTS - Focused Assessment []  - Circumferential Edema Measurements - multi extremities 0 []  - Nutritional Assessment / Counseling / Intervention 0 X - Lower Extremity Assessment (monofilament, tuning fork, pulses) 1 5 []  - Peripheral Arterial Disease Assessment (using hand held doppler) 0 ASSESSMENTS - Ostomy and/or Continence Assessment and Care []  - Incontinence Assessment and Management 0 []  - Ostomy Care Assessment and Management (repouching, etc.) 0 PROCESS - Coordination of Care X - Simple Patient / Family Education for ongoing care 1 15 []  - Complex (extensive) Patient / Family Education for ongoing care 0 []  - Staff obtains Programmer, systems, Records, Test Results / Process Orders 0 []  - Staff telephones HHA, Nursing Homes / Clarify orders / etc 0 []  - Routine Transfer to another Facility (non-emergent condition) 0 [name] [name], [name] [name] ([id number]) []  - Routine Hospital Admission (non-emergent condition) 0 []  - New Admissions / Biomedical engineer / Ordering NPWT, Apligraf, etc. 0 []  - Emergency Hospital Admission (emergent condition) 0 X - Simple Discharge Coordination 1 10 []  - Complex (extensive) Discharge Coordination 0 PROCESS - Special Needs []  - Pediatric / Minor Patient Management 0 []  - Isolation Patient Management 0 []  - Hearing / Language / Visual special needs 0 []  - Assessment of Community assistance (transportation, D/C planning, etc.) 0 []  - Additional assistance / Altered mentation 0 []  - Support Surface(s) Assessment (bed, cushion, seat, etc.) 0 INTERVENTIONS - Wound Cleansing / Measurement X - Simple Wound  Cleansing - one wound 1 5 []  - Complex Wound Cleansing - multiple wounds 0 X - Wound Imaging (photographs - any number of  wounds) 1 5 []  - Wound Tracing (instead of photographs) 0 X - Simple Wound Measurement - one wound 1 5 []  - Complex Wound Measurement - multiple wounds 0 INTERVENTIONS - Wound Dressings X - Small Wound Dressing one or multiple wounds 1 10 []  - Medium Wound Dressing one or multiple wounds 0 []  - Large Wound Dressing one or multiple wounds 0 []  - Application of Medications - topical 0 []  - Application of Medications - injection 0 INTERVENTIONS - Miscellaneous []  - External ear exam 0 [name] [name], [name] [name] ([id number]) []  - Specimen Collection (cultures, biopsies, blood, body fluids, etc.) 0 []  - Specimen(s) / Culture(s) sent or taken to Lab for analysis 0 []  - Patient Transfer (multiple staff / Harrel Lemon Lift / Similar devices) 0 []  - Simple Staple / Suture removal (25 or less) 0 []  - Complex Staple / Suture removal (26 or more) 0 []  - Hypo / Hyperglycemic Management (close monitor of Blood Glucose) 0 []  - Ankle / Brachial Index (ABI) - do not check if billed separately 0 X - Vital Signs 1 5 Has the patient been seen at the hospital within the last three years: Yes Total Score: 80 Level Of Care: New/Established - Level 3 Electronic Signature(s) Signed: 09/08/2016 4:59:08 PM By: Montey Hora Entered By: Montey Hora on 09/08/2016 12:52:05 [name] [name] ([id number]) -------------------------------------------------------------------------------- Encounter Discharge Information Details Patient Name: [name] [name], [name] [name]. Date of Service: 09/08/2016 12:30 PM Medical Record Number: [id number] Patient Account Number: [id number] Date of Birth/Sex: [birth year] (82 y.o. Male) Treating RN: Montey Hora Primary Care Naylah Cork: [facility], [place] Other Clinician: Referring Shaquan Missey: [facility], [place] Lexxie Winberg/Extender: Tito Dine in Treatment: 1 Encounter Discharge  Information Items Discharge Pain Level: 0 Discharge Condition: Stable Ambulatory Status: Walker Discharge Destination: Home Private Transportation: Auto Accompanied By: spouse Schedule Follow-up Appointment: Yes Medication Reconciliation completed and No provided to Patient/Care Brittani Purdum: Clinical Summary of Care: Electronic Signature(s) Signed: 09/08/2016 1:26:38 PM By: Montey Hora Entered By: Montey Hora on 09/08/2016 13:26:38 [name] [name] ([id number]) -------------------------------------------------------------------------------- General Visit Notes Details Patient Name: [name] [name]. Date of Service: 09/08/2016 12:30 PM Medical Record Number: [id number] Patient Account Number: [id number] Date of Birth/Sex: 05/11/1934 (82 y.o. Male) Treating RN: Montey Hora Primary Care Mirielle Byrum: [facility], [place] Other Clinician: Referring Adline Kirshenbaum: [facility], [place] Treating Masa Lubin/Extender: Tito Dine in Treatment: 1 Notes Per Dr Dellia Nims, patient has a Wagner Grade 3 wound. He states that it is a grade 3 due to osteomyelitis, wound probes to bone. Wound assessment updated accordingly Electronic Signature(s) Signed: 09/08/2016 1:08:28 PM By: Montey Hora Entered By: Montey Hora on 09/08/2016 13:08:28 [name] [name] ([id number]) -------------------------------------------------------------------------------- Lower Extremity Assessment Details Patient Name: [name] [name], [name] [name]. Date of Service: 09/08/2016 12:30 PM Medical Record Number: [id number] Patient Account Number: [id number] Date of Birth/Sex: [birth year] (82 y.o. Male) Treating RN: Montey Hora Primary Care Carolle Ishii: [facility], [place] Other Clinician: Referring Talasia Saulter: [facility], [place] Treating Tramond Slinker/Extender: Ricard Dillon Weeks in Treatment: 1 Edema Assessment Assessed: [Left: No] [Right: No] E[Left: dema] [Right: :] Calf Left: Right: Point of Measurement: 38 cm From Medial Instep cm cm Ankle Left:  Right: Point of Measurement: 12 cm From Medial Instep cm cm Vascular Assessment Pulses: Dorsalis Pedis Palpable: [Right:Yes] Posterior Tibial Extremity colors, hair growth, and conditions: Hair Growth on Extremity: [Right:No] Temperature of Extremity: [Right:Warm] Capillary Refill: [Right:< 3 seconds] Electronic Signature(s) Signed: 09/08/2016 4:59:08 PM By: Montey Hora Entered By: Montey Hora on 09/08/2016 12:49:43 [name] [name] ([id number]) -------------------------------------------------------------------------------- Multi Wound Chart Details Patient Name:  [name] [name]. Date of Service: 09/08/2016 12:30 PM Medical Record Number: [id number] Patient Account Number: [id number] Date of Birth/Sex: 11-29-1934 (82 y.o. Male) Treating RN: Montey Hora Primary Care Kianah Harries: [facility], [place] Other Clinician: Referring Kimba Lottes: [facility], [place] Treating Ashden Sonnenberg/Extender: Ricard Dillon Weeks in Treatment: 1 Vital Signs Height(in): 72 Pulse(bpm): 67 Weight(lbs): 285 Blood Pressure 135/60 (mmHg): Body Mass Index(BMI): 39 Temperature(F): 97.5 Respiratory Rate 20 (breaths/min): Photos: [N/A:N/A] Wound Location: Right Calcaneus N/A N/A Wounding Event: Trauma N/A N/A Primary Etiology: Diabetic Wound/Ulcer of N/A N/A the Lower Extremity Comorbid History: Cataracts, Arrhythmia, N/A N/A Coronary Artery Disease, Type II Diabetes Date Acquired: 07/13/2016 N/A N/A Weeks of Treatment: 1 N/A N/A Wound Status: Open N/A N/A Measurements L x W x D 1.5x5.2x2.2 N/A N/A (cm) Area (cm) : 6.126 N/A N/A Volume (cm) : 13.477 N/A N/A % Reduction in Area: -50.00% N/A N/A % Reduction in Volume: -83.30% N/A N/A Classification: Grade 3 N/A N/A Earleen Newport Verification: Other N/A N/A Exudate Amount: Large N/A N/A Exudate Type: Purulent N/A N/A Exudate Color: yellow, brown, green N/A N/A Wound Margin: Flat and Intact N/A N/A Granulation Amount: Medium (34-66%) N/A N/A [name] [name], [name] [name]  ([id number]) Granulation Quality: Red N/A N/A Necrotic Amount: Medium (34-66%) N/A N/A Necrotic Tissue: Eschar, Adherent Slough N/A N/A Exposed Structures: Fascia: No N/A N/A Fat Layer (Subcutaneous Tissue) Exposed: No Tendon: No Muscle: No Joint: No Bone: No Epithelialization: None N/A N/A Periwound Skin Texture: Excoriation: No N/A N/A Induration: No Callus: No Crepitus: No Rash: No Scarring: No Periwound Skin Maceration: Yes N/A N/A Moisture: Dry/Scaly: No Periwound Skin Color: Atrophie Blanche: No N/A N/A Cyanosis: No Ecchymosis: No Erythema: No Hemosiderin Staining: No Mottled: No Pallor: No Rubor: No Temperature: No Abnormality N/A N/A Tenderness on Yes N/A N/A Palpation: Wound Preparation: Ulcer Cleansing: N/A N/A Rinsed/Irrigated with Saline Topical Anesthetic Applied: Other: lidocaine 4% Treatment Notes Wound #1 (Right Calcaneus) 1. Cleansed with: Clean wound with Normal Saline 2. Anesthetic Topical Lidocaine 4% cream to wound bed prior to debridement 4. Dressing Applied: Aquacel Ag Other dressing (specify in notes) 5. Secondary Dressing Applied ABD and Kerlix/Conform [name] [name], MCELHINEY. ([id number]) 7. Secured with Tape Notes drawtex Electronic Signature(s) Signed: 09/08/2016 5:27:39 PM By: Linton Ham MD Entered By: Linton Ham on 09/08/2016 14:26:32 [name] [name] ([id number]) -------------------------------------------------------------------------------- [place] Details Patient Name: [name] [name], [name] [name]. Date of Service: 09/08/2016 12:30 PM Medical Record Number: [id number] Patient Account Number: [id number] Date of Birth/Sex: [birth year] (82 y.o. Male) Treating RN: Montey Hora Primary Care Janiyha Montufar: [facility], [place] Other Clinician: Referring Teancum Brule: [facility], [place] Treating Makia Bossi/Extender: Tito Dine in Treatment: 1 Active Inactive Electronic Signature(s) Signed: 09/14/2016 9:21:12 AM By: Gretta Cool, BSN, RN,  CWS, Kim RN, BSN Signed: 09/14/2016 4:59:03 PM By: Montey Hora Previous Signature: 09/08/2016 4:59:08 PM Version By: Montey Hora Entered By: Gretta Cool BSN, RN, CWS, Kim on 09/14/2016 09:21:12 [name] [name], [name] [name] ([id number]) -------------------------------------------------------------------------------- Pain Assessment Details Patient Name: [name] [name], [name] [name]. Date of Service: 09/08/2016 12:30 PM Medical Record Number: [id number] Patient Account Number: [id number] Date of Birth/Sex: Jan 12, 1935 (82 y.o. Male) Treating RN: Montey Hora Primary Care Deondray Ospina: [facility], [place] Other Clinician: Referring Ahlayah Tarkowski: [facility], [place] Treating Shatara Stanek/Extender: Tito Dine in Treatment: 1 Active Problems Location of Pain Severity and Description of Pain Patient Has Paino No Site Locations Pain Management and Medication Current Pain Management: Notes Topical or injectable lidocaine is offered to patient for acute pain when surgical debridement is performed. If needed, Patient is instructed to use over the counter pain  medication for the following 24-48 hours after debridement. Wound care MDs do not prescribed pain medications. Patient has chronic pain or uncontrolled pain. Patient has been instructed to make an appointment with their Primary Care Physician for pain management. Electronic Signature(s) Signed: 09/08/2016 4:59:08 PM By: Montey Hora Entered By: Montey Hora on 09/08/2016 12:31:52 [name] [name] ([id number]) -------------------------------------------------------------------------------- Patient/Caregiver Education Details Patient Name: [name] [name], [name] [name]. Date of Service: 09/08/2016 12:30 PM Medical Record Number: [id number] Patient Account Number: [id number] Date of Birth/Gender: [birth year] (82 y.o. Male) Treating RN: Montey Hora Primary Care Physician: [facility], [place] Other Clinician: Referring Physician: [facility], [place] Treating Physician/Extender: Tito Dine in  Treatment: 1 Education Assessment Education Provided To: Patient and Caregiver Education Topics Provided Wound/Skin Impairment: Handouts: Other: wound care as ordered and go to the ED at [place]: Demonstration, Explain/Verbal Responses: State content correctly Electronic Signature(s) Signed: 09/08/2016 4:59:08 PM By: Montey Hora Entered By: Montey Hora on 09/08/2016 13:27:14 [name] [name] ([id number]) -------------------------------------------------------------------------------- Wound Assessment Details Patient Name: [name] [name]. Date of Service: 09/08/2016 12:30 PM Medical Record Number: [id number] Patient Account Number: [id number] Date of Birth/Sex: 26-May-1934 (82 y.o. Male) Treating RN: Montey Hora Primary Care Keenan Dimitrov: [facility], [place] Other Clinician: Referring Harwood Nall: [facility], [place] Treating Franklin Clapsaddle/Extender: Ricard Dillon Weeks in Treatment: 1 Wound Status Wound Number: 1 Primary Diabetic Wound/Ulcer of the Lower Etiology: Extremity Wound Location: Right Calcaneus Wound Open Wounding Event: Trauma Status: Date Acquired: 07/13/2016 Comorbid Cataracts, Arrhythmia, Coronary Weeks Of Treatment: 1 History: Artery Disease, Type II Diabetes Clustered Wound: No Photos Photo Uploaded By: Montey Hora on 09/08/2016 13:53:00 Wound Measurements Length: (cm) 1.5 Width: (cm) 5.2 Depth: (cm) 2.2 Area: (cm) 6.126 Volume: (cm) 13.477 % Reduction in Area: -50% % Reduction in Volume: -83.3% Epithelialization: None Tunneling: No Undermining: No Wound Description Classification: Grade 3 Wagner Verification: Other Wound Margin: Flat and Intact Method: probing wound to bone Rationale: per MD Exudate Amount: Large Exudate Type: Purulent Exudate Color: yellow, brown, green Foul Odor After Cleansing: No Slough/Fibrino Yes Wound Bed Granulation Amount: Medium (34-66%) Exposed Structure BLESSED, COTHAM ([id number]) Granulation Quality: Red Fascia  Exposed: No Necrotic Amount: Medium (34-66%) Fat Layer (Subcutaneous Tissue) Exposed: No Necrotic Quality: Eschar, Adherent Slough Tendon Exposed: No Muscle Exposed: No Joint Exposed: No Bone Exposed: No Periwound Skin Texture Texture Color No Abnormalities Noted: No No Abnormalities Noted: No Callus: No Atrophie Blanche: No Crepitus: No Cyanosis: No Excoriation: No Ecchymosis: No Induration: No Erythema: No Rash: No Hemosiderin Staining: No Scarring: No Mottled: No Pallor: No Moisture Rubor: No No Abnormalities Noted: No Dry / Scaly: No Temperature / Pain Maceration: Yes Temperature: No Abnormality Tenderness on Palpation: Yes Wound Preparation Ulcer Cleansing: Rinsed/Irrigated with Saline Topical Anesthetic Applied: Other: lidocaine 4%, Electronic Signature(s) Signed: 09/08/2016 1:05:42 PM By: Montey Hora Entered By: Montey Hora on 09/08/2016 13:05:42 [name] [name] ([id number]) -------------------------------------------------------------------------------- Vitals Details Patient Name: JANDIEL, MAGALLANES. Date of Service: 09/08/2016 12:30 PM Medical Record Number: [id number] Patient Account Number: [id number] Date of Birth/Sex: 1934/12/14 (82 y.o. Male) Treating RN: Montey Hora Primary Care Pollyanna Levay: [facility], [place] Other Clinician: Referring Odas Ozer: [facility], [place] Treating Taegan Standage/Extender: Tito Dine in Treatment: 1 Vital Signs Time Taken: 12:31 Temperature (F): 97.5 Height (in): 72 Pulse (bpm): 67 Weight (lbs): 285 Respiratory Rate (breaths/min): 20 Body Mass Index (BMI): 38.6 Blood Pressure (mmHg): 135/60 Reference Range: 80 - 120 mg / dl Electronic Signature(s) Signed: 09/08/2016 4:59:08 PM By: Montey Hora Entered By: Montey Hora on 09/08/2016 12:32:33

## 2016-09-09 NOTE — Progress Notes (Signed)
DEROLD, DORSCH ([id number]) Visit Report for 09/08/2016 Chief Complaint Document Details Patient Name: [name] [name], [name] [name]. Date of Service: 09/08/2016 12:30 PM Medical Record Number: [id number] Patient Account Number: [id number] Date of Birth/Sex: [birth year] (82 y.o. Male) Treating RN: Montey Hora Primary Care Provider: [facility], [place] Other Clinician: Referring Provider: [facility], [place] Provider/Extender: Tito Dine in Treatment: 1 Information Obtained from: Patient Chief Complaint 09/01/16; patient is here for review of wounds on his right plantar heel in the setting of type 2 diabetes Electronic Signature(s) Signed: 09/08/2016 5:27:39 PM By: Linton Ham MD Entered By: Linton Ham on 09/08/2016 14:26:41 [name] ([id number]) -------------------------------------------------------------------------------- HPI Details Patient Name: [name], [name]. Date of Service: 09/08/2016 12:30 PM Medical Record Number: [id number] Patient Account Number: [id number] Date of Birth/Sex: 07-08-1934 (82 y.o. Male) Treating RN: Montey Hora Primary Care Provider: [facility], [place] Other Clinician: Referring Provider: [facility], [place] Treating Provider/Extender: Tito Dine in Treatment: 1 History of Present Illness HPI Description: 09/01/16 this is a pleasant 82 year old man who arrives in clinic accompanied by his wife. They live in [place]. He is a type II diabetic with last hemoglobin A1c of 7.7 in March/18. His problem in his right heel began early in the month. He apparently was pushing off on a reclining chair brought his heel down and suffered some form of puncture wound in the tip of his right heel. He was eventually referred to Dr. Caprice Beaver. I am not sure of the exact date of this however he had an MRI of the ankle on 08/03/16 which did not show osteomyelitis of the right ankle there was a small soft tissue wound on the posterior aspect of the calcaneus  without focal fluid collection this suggest an abscess. Nevertheless this did not improve and he went to the OR with Dr. Caprice Beaver on 08/12/16 for operative debridement. Her Dorothyann Peng was also given a cam walking boot to protect the heel although the patient found this too uncomfortable. 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 Apparently x-rays have been done of the heel that did not show underlying problems according to the patient. Cultures of the heel were apparently also done although I don't see these in Epic. Apparently a "blood blister" was noted by the patient's wife late last week. The patient saw Dr. Caprice Beaver and ciprofloxacin was prescribed. He has enough ciprofloxacin until this Friday. The patient is uncomfortable but not systemically unwell. 09/08/16; patient in the clinic today in follow-up for the right heel ulcer. Things have not been going well. He is still on the clindamycin having completed Cipro 2 days ago. There is increasing drainage and now 2 wounds one on the tip of the heel and one on the medial heel which are now probing to bone and draining purulent drainage. Has been using silver alginate. No systemic complaints surprisingly the culture I did last week was negative. The patient has an MRI booked for 5 PM this afternoon Electronic Signature(s) Signed: 09/08/2016 5:27:39 PM By: Linton Ham MD Entered By: Linton Ham on 09/08/2016 14:30:08 [name] ([id number]) -------------------------------------------------------------------------------- Physical Exam Details Patient Name: [name], [name]. Date of Service: 09/08/2016 12:30 PM Medical Record Number: [id number] Patient Account Number: [id number] Date of Birth/Sex: 04-06-1935 (82 y.o. Male) Treating RN: Montey Hora Primary Care Provider: [facility], [place] Other Clinician: Referring Provider: [facility], [place] Treating  Provider/Extender: Ricard Dillon Weeks in Treatment:  1 Constitutional Sitting or standing Blood Pressure is within target range for patient.. Pulse regular and within target range for patient.Marland Kitchen Respirations regular, non-labored and within target range.. Temperature is normal and within the target range for the patient.Marland Kitchen appears in no distress. Cardiovascular Relative sounds. His pulses palpable. Notes [place]; the patient's heel has deteriorated. The wound on the tip of the heel now probes to bone with purulent drainage. The area on the medial wound continues to probe to bone again with purulent drainage. There is significant surrounding pain and tenderness. Electronic Signature(s) Signed: 09/08/2016 5:27:39 PM By: Linton Ham MD Entered By: Linton Ham on 09/08/2016 14:29:28 [name] ([id number]) -------------------------------------------------------------------------------- Physician Orders Details Patient Name: [name], [name]. Date of Service: 09/08/2016 12:30 PM Medical Record Number: [id number] Patient Account Number: [id number] Date of Birth/Sex: [birth year] (82 y.o. Male) Treating RN: Montey Hora Primary Care Provider: [facility], [place] Other Clinician: Referring Provider: [facility], [place] Treating Provider/Extender: Tito Dine in Treatment: 1 Verbal / Phone Orders: No Diagnosis Coding Wound Cleansing Wound #1 Right Calcaneus o Clean wound with Normal Saline. Anesthetic Wound #1 Right Calcaneus o Topical Lidocaine 4% cream applied to wound bed prior to debridement Primary Wound Dressing Wound #1 Right Calcaneus o Aquacel Ag Secondary Dressing Wound #1 Right Calcaneus o Gauze and Kerlix/Conform o Foam - heel cup o Drawtex Dressing Change Frequency Wound #1 Right Calcaneus o Change dressing every day. Follow-up Appointments Wound #1 Right Calcaneus o Return Appointment in 1 week. Edema Control Wound #1 Right Calcaneus o  Elevate legs to the level of the heart and pump ankles as often as possible Additional Orders / Instructions Wound #1 Right Calcaneus o Increase protein intake. o Other: - Please add vitamin A, vitamin C and zinc supplements to your diet OFFIE, WAIDE ([id number]) [place] #1 Right [place] Nurse may visit PRN to address patientos wound care needs. o FACE TO FACE ENCOUNTER: MEDICARE and MEDICAID PATIENTS: I certify that this patient is under my care and that I had a face-to-face encounter that meets the physician face-to-face encounter requirements with this patient on this date. The encounter with the patient was in whole or in part for the following MEDICAL CONDITION: (primary reason for [place]) MEDICAL NECESSITY: I certify, that based on my findings, NURSING services are a medically necessary home health service. HOME BOUND STATUS: I certify that my clinical findings support that this patient is homebound (i.e., Due to illness or injury, pt requires aid of supportive devices such as crutches, cane, wheelchairs, walkers, the use of special transportation or the assistance of another person to leave their place of residence. There is a normal inability to leave the home and doing so requires considerable and taxing effort. Other absences are for medical reasons / religious services and are infrequent or of short duration when for other reasons). o If current dressing causes regression in wound condition, may D/C ordered dressing product/s and apply Normal Saline Moist Dressing daily until next [place] / Other MD appointment. [place] of regression in wound condition at [phone]. o Please direct any NON-WOUND related issues/requests for orders to patient's Primary Care Physician Electronic Signature(s) Signed: 09/08/2016 4:59:08 PM By: Montey Hora Signed:  09/08/2016 5:27:39 PM By: Linton Ham MD Entered By: Montey Hora on 09/08/2016 12:53:30 JAQUA, CHING ([id number]) -------------------------------------------------------------------------------- Problem List Details Patient Name: YOSIAH, JASMIN. Date of Service: 09/08/2016 12:30 PM Medical Record  Number: [id number] Patient Account Number: [id number] Date of Birth/Sex: 04/16/1934 (82 y.o. Male) Treating RN: Montey Hora Primary Care Provider: 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 Date of Service: 09/08/2016 12:30 PM Medical Record Number: [id number] Patient Account Number: [id number] Date of Birth/Sex: 11-15-1934 (82 y.o. Male) Treating RN: Montey Hora Primary Care Provider: [facility], [place], [place] Other Clinician: Referring Provider: [facility], [place] Treating Provider/Extender: Tito Dine in Treatment: 1 Subjective Chief Complaint Information obtained from Patient 09/01/16; patient is here for review of wounds on his right plantar heel in the setting of type 2 diabetes History of Present Illness (HPI) 09/01/16 this is a pleasant 82 year old man who arrives in clinic accompanied by  his wife. They live in [place]. He is a type II diabetic with last hemoglobin A1c of 7.7 in March/18. His problem in his right heel began early in the month. He apparently was pushing off on a reclining chair brought his heel down and suffered some form of puncture wound in the tip of his right heel. He was eventually referred to Dr. Caprice Beaver. I am not sure of the exact date of this however he had an MRI of the ankle on 08/03/16 which did not show osteomyelitis of the right ankle there was a small soft tissue wound on the posterior aspect of the calcaneus without focal fluid collection this suggest an abscess. Nevertheless this did not improve and he went to the OR with Dr. Caprice Beaver on 08/12/16 for operative debridement. Her Dorothyann Peng was also given a cam walking boot to protect the heel although the patient found this too uncomfortable. 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 Apparently x-rays have been done of the heel that did not show underlying problems according to the patient. Cultures of the heel were apparently also done although I don't see these in Epic. Apparently a "blood blister" was noted by the patient's wife late last week. The patient saw Dr. Caprice Beaver and ciprofloxacin was prescribed. He has enough ciprofloxacin until this Friday. The patient is uncomfortable but not systemically unwell. 09/08/16; patient in the clinic today in follow-up for the right heel ulcer. Things have not been going well. He is still on the clindamycin having completed Cipro 2 days ago. There is increasing drainage and now 2 wounds one on the tip of the heel and one on the medial heel which are now probing to bone and draining purulent drainage. Has been using silver alginate. No systemic complaints surprisingly the culture I did last week was negative. The patient has an MRI booked for 5 PM this  afternoon Objective Constitutional Sitting or standing Blood Pressure is within target range for patient.. Pulse regular and within target range for patient.Marland Kitchen Respirations regular, non-labored and within target range.. Temperature is normal and within JOLAN, UPCHURCH. ([id number]) the target  range for the patient.Marland Kitchen appears in no distress. Vitals Time Taken: 12:31 PM, Height: 72 in, Weight: 285 lbs, BMI: 38.6, Temperature: 97.5 F, Pulse: 67 bpm, Respiratory Rate: 20 breaths/min, Blood Pressure: 135/60 mmHg. Cardiovascular Relative sounds. His pulses palpable. General Notes: [place]; the patient's heel has deteriorated. The wound on the tip of the heel now probes to bone with purulent drainage. The area on the medial wound continues to probe to bone again with purulent drainage. There is significant surrounding pain and tenderness. Integumentary (Hair, Skin) Wound #1 status is Open. Original cause of wound was Trauma. The wound is located on the Right Calcaneus. The wound measures 1.5cm length x 5.2cm width x 2.2cm depth; 6.126cm^2 area and 13.477cm^3 volume. There is no tunneling or undermining noted. There is a large amount of purulent drainage noted. The wound margin is flat and intact. There is medium (34-66%) red granulation within the wound bed. There is a medium (34-66%) amount of necrotic tissue within the wound bed including Eschar and Adherent Slough. The periwound skin appearance exhibited: Maceration. The periwound skin appearance did not exhibit: Callus, Crepitus, Excoriation, Induration, Rash, Scarring, Dry/Scaly, Atrophie Blanche, Cyanosis, Ecchymosis, Hemosiderin Staining, Mottled, Pallor, Rubor, Erythema. Periwound temperature was noted as No Abnormality. The periwound has tenderness on palpation. Assessment Active Problems ICD-10 E11.621 - Type 2 diabetes mellitus with foot ulcer L03.115 - Cellulitis of right lower limb L97.414 - Non-pressure chronic ulcer of right heel  and midfoot with necrosis of bone E11.51 - Type 2 diabetes mellitus with diabetic peripheral angiopathy without gangrene Plan Wound Cleansing: Wound #1 Right Calcaneus: Clean wound with Normal Saline. Anesthetic: Wound #1 Right Calcaneus: JAVARIOUS, ELSAYED ([id number]) Topical Lidocaine 4% cream applied to wound bed prior to debridement Primary Wound Dressing: Wound #1 Right Calcaneus: Aquacel Ag Secondary Dressing: Wound #1 Right Calcaneus: Gauze and Kerlix/Conform Foam - heel cup Drawtex Dressing Change Frequency: Wound #1 Right Calcaneus: Change dressing every day. Follow-up Appointments: Wound #1 Right Calcaneus: Return Appointment in 1 week. Edema Control: Wound #1 Right Calcaneus: Elevate legs to the level of the heart and pump ankles as often as possible Additional Orders / Instructions: Wound #1 Right Calcaneus: Increase protein intake. Other: - Please add vitamin A, vitamin C and zinc supplements to your diet Home Health: Wound #1 Right Calcaneus: Continue Home Health Visits - [facility] Nurse may visit PRN to address patient s wound care needs. FACE TO FACE ENCOUNTER: MEDICARE and MEDICAID PATIENTS: I certify that this patient is under my care and that I had a face-to-face encounter that meets the physician face-to-face encounter requirements with this patient on this date. The encounter with the patient was in whole or in part for the following MEDICAL CONDITION: (primary reason for [place]) MEDICAL NECESSITY: I certify, that based on my findings, NURSING services are a medically necessary home health service. HOME BOUND STATUS: I certify that my clinical findings support that this patient is homebound (i.e., Due to illness or injury, pt requires aid of supportive devices such as crutches, cane, wheelchairs, walkers, the use of special transportation or the assistance of another person to leave their place of residence. There is a normal inability  to leave the home and doing so requires considerable and taxing effort. Other absences are for medical reasons / religious services and are infrequent or of short duration when for other reasons). If current dressing causes regression in wound condition, may D/C ordered dressing product/s and apply Normal Saline Moist Dressing daily until next Wound  [facility]aling [facility] / Other MD appointment. [place] of regression in wound condition at [phone]. Please direct any NON-WOUND related issues/requests for orders to patient's Primary Care Physician #1 this is a dramatically worse situation than last week. I think he is going to need IV antibiotics and for that reason I referred him to hospital at [facility] which is their facility of choice MONTEE, TALLMAN ([id number]) #2 I still think it is very likely this patient has underlying osteomyelitis based on the now true probing wounds to bone. Electronic Signature(s) Signed: 09/08/2016 5:27:39 PM By: Linton Ham MD Entered By: Linton Ham on 09/08/2016 14:32:38 [name] ([id number]) -------------------------------------------------------------------------------- [place] Details Patient Name: IDAN, PRIME. Date of Service: 09/08/2016 Medical Record Number: [id number] Patient Account Number: [id number] Date of Birth/Sex: [birth year] (82 y.o. 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

## 2016-09-09 NOTE — Progress Notes (Signed)
Inpatient Diabetes Program Recommendations  AACE/ADA: New Consensus Statement on Inpatient Glycemic Control (2015)  Target Ranges:  Prepandial:   less than 140 mg/dL      Peak postprandial:   less than 180 mg/dL (1-2 hours)      Critically ill patients:  140 - 180 mg/dL   Results for DEMITRIS, POKORNY (MRN [id number]) as of 09/09/2016 08:30  Ref. Range 09/08/2016 23:01 09/09/2016 08:26  Glucose-Capillary Latest Ref Range: 65 - 99 mg/dL 197 (H) 211 (H)   Review of Glycemic Control  Current orders for Inpatient glycemic control: Novolog 0-9 units TID with meals, Victoza 1.8 mg QAM  Inpatient Diabetes Program Recommendations: Insulin - Basal: Please consider ordering Lantus 13 units Q24H.  Correction (SSI): Please consider ordering Novolog 0-5 units QHS for bedtime correction. Insulin - Meal Coverage: Please consider ordering Novolog 5 units TID with meals for meal coverage if patient eats at least 50% of meals.  Thanks, Barnie Alderman, RN, MSN, CDE Diabetes Coordinator Inpatient Diabetes Program [phone] (Team Pager from 8am to 5pm)

## 2016-09-09 NOTE — Progress Notes (Signed)
Pharmacy Antibiotic Note  [name] [name] is a 82 y.o. male admitted on 09/08/2016 with sepsis / osteomyelitis.  Pharmacy has been consulted for Vancomycin and cefepime dosing.  Plan:  Cefepime 2gm IV q8h  Continue Vancomycin 1000mg  IV q12h Check trough at steady state  Monitor labs, renal fxn, progress and c/s Deescalate ABX when improved / appropriate.    Height: 6' (182.9 cm) Weight: 286 lb 3.2 oz (129.8 kg) IBW/kg (Calculated) : 77.6  Temp (24hrs), Avg:98 F (36.7 C), Min:97.6 F (36.4 C), Max:98.7 F (37.1 C)   Recent Labs Lab 09/08/16 1545 09/09/16 0514  WBC 17.3* 16.0*  CREATININE 0.92 0.84    Estimated Creatinine Clearance: 99.3 mL/min (by C-G formula based on SCr of 0.84 mg/dL).    Allergies  Allergen Reactions  . Erythromycin Other (See Comments)  . Penicillins Hives and Other (See Comments)    Has patient had a PCN reaction causing immediate rash, facial/tongue/throat swelling, SOB or lightheadedness with hypotension: No Has patient had a PCN reaction causing severe rash involving mucus membranes or skin necrosis: No Has patient had a PCN reaction that required hospitalization No Has patient had a PCN reaction occurring within the last 10 years: No If all of the above answers are "NO", then may proceed with Cephalosporin use.     Antimicrobials this admission: Cefepime 6/ 6>> Vancomycin 6/5 >>  Aztreonam 6/5 >> 6/6  Dose adjustments this admission:  Microbiology results:  6/5BCx: ngtd  6/6 Surgical WoundCx: pending  6/5 foot Cx: GPC, rare GPR  08/11/16 MRSA PCR is neg  Thank you for allowing pharmacy to be a part of this patient's care.  Isac Sarna, BS Pharm D, [place] Clinical Pharmacist Pager [phone] 09/09/2016 5:27 PM

## 2016-09-09 NOTE — Op Note (Signed)
OPERATIVE NOTE  DATE OF PROCEDURE 09/09/2016  SURGEON Marcheta Grammes, DPM  ASSISTANT SURGEON None  OR STAFF Circulator: Jayme Cloud, RN Scrub Person: Lucie Leather, CST   PREOPERATIVE DIAGNOSIS 1.  Nonhealing puncture wound of the right heel 2.  Osteomyelitis right heel 3.  Diabetes mellitus  POSTOPERATIVE DIAGNOSIS Same  PROCEDURE 1.  Debridement of skin, subcutaneous tissue and fascia, right heel 2.  Application of Wound VAC, right heel  ANESTHESIA MAC with local  HEMOSTASIS Pneumatic calf tourniquet set at 250 mmHg  ESTIMATED BLOOD LOSS <25 cc  MATERIALS USED Wound VAC with 2 pieces of sponge (1 white sponge and 1 black sponge)  INJECTABLES 0.5% Marcaine plain 1% Lidocaine plain  PATHOLOGY Aerobic culture Anaerobic culture  COMPLICATIONS None  INDICATIONS:  Nonhealing wound of the right heel after stepping on a toothpick.  He is failed to significantly improve with local wound care and oral antibiotics.  He was referred to the [facility] last week.  He was referred to the [facility] Emergency Department from the [facility] yesterday.  DESCRIPTION OF THE PROCEDURE:  The patient was brought to the operating room and placed on the operative table in the supine position.  A pneumatic calf tourniquet was applied to the operative extremity.  Following sedation, the surgical site was anesthetized with 0.5% Marcaine plain.  The foot was then prepped, scrubbed, and draped in the usual sterile technique.  The foot was elevated, exsanguinated and the pneumatic calf tourniquet inflated to 250 mmHg.   A full-thickness wound was encountered along the plantar lateral aspect of the right heel.  This corresponded to the initial wound.  A second wound was encountered along the plantar medial aspect of the right heel.  The lateral wound was continuous with the medial wound.  The lateral wound probed to bone.  Purulent exudate was  expressed from both wounds.  Additional local anesthetic was required.  1% lidocaine plain was injected proximal to the surgical site.  2 converging semielliptical incisions were made encompassing both wounds.  The wedge of skin was removed and passed from the operative field.  Aerobic and anaerobic cultures were obtained.  Significant fat necrosis was encountered.  Abnormal subcutaneous tissue and plantar fascia was debrided using a number 15 blade, scissor and rongeur to bleeding viable tissue.  No foreign body was encountered.  No further tracking or tunneling was encountered.  The exposed area of calcaneus appeared normal in color and was firm.  No visible erosive changes were present.  The surgical wound was irrigated with 3 L of saline using pulse lavage.  Post-debridement, the surgical wound measured 3.1 cm in length by 6.9 cm in width by 2.5 cm in depth.  KCI wound VAC was applied to the right heel.  One piece of white sponge was used and applied over the exposed calcaneus.  One piece of black sponge was used.  The wound VAC was found to be operational at 125 mmHg continuous pressure.  The pneumatic calf tourniquet was deflated.  A prompt hyperemic response was noted to all digits of the right foot.  The wound VAC remained operational.  A sterile dressing was applied over the wound VAC dressing to keep the wound VAC tubing off of his skin.  He tolerated the procedure and anesthesia well.  He was transferred from the operating room to the PACU with vital signs stable and vascular status intact to all digits of the right foot.

## 2016-09-09 NOTE — Anesthesia Postprocedure Evaluation (Signed)
Anesthesia Post Note  Patient: [name] [name]  Procedure(s) Performed: Procedure(s) (LRB): DEBRIDEMENT WOUND RT heel with wound vac attachment (Right) APPLICATION OF WOUND VAC (Right)  Patient location during evaluation: PACU Anesthesia Type: MAC Level of consciousness: awake Pain management: satisfactory to patient Vital Signs Assessment: post-procedure vital signs reviewed and stable Respiratory status: spontaneous breathing and patient connected to nasal cannula oxygen Cardiovascular status: stable Anesthetic complications: no     Last Vitals:  Vitals:   09/09/16 1202 09/09/16 1215  BP:  (!) 100/32  Pulse:  71  Resp: 20   Temp: 36.7 C     Last Pain:  Vitals:   09/09/16 1202  TempSrc:   PainSc: 6                  Trudie Cervantes

## 2016-09-10 ENCOUNTER — Encounter (HOSPITAL_COMMUNITY): Payer: Self-pay | Admitting: Podiatry

## 2016-09-10 ENCOUNTER — Inpatient Hospital Stay (HOSPITAL_COMMUNITY): Payer: Medicare Other

## 2016-09-10 DIAGNOSIS — I4891 Unspecified atrial fibrillation: Secondary | ICD-10-CM

## 2016-09-10 DIAGNOSIS — I10 Essential (primary) hypertension: Secondary | ICD-10-CM

## 2016-09-10 LAB — GLUCOSE, CAPILLARY
GLUCOSE-CAPILLARY: 227 mg/dL — AB (ref 65–99)
Glucose-Capillary: 262 mg/dL — ABNORMAL HIGH (ref 65–99)
Glucose-Capillary: 234 mg/dL — ABNORMAL HIGH (ref 65–99)
Glucose-Capillary: 189 mg/dL — ABNORMAL HIGH (ref 65–99)

## 2016-09-10 MED ORDER — INSULIN GLARGINE 100 UNIT/ML ~~LOC~~ SOLN
15.0000 [IU] | Freq: Every day | SUBCUTANEOUS | Status: DC
  Administered 2016-09-10: 15 [IU] via SUBCUTANEOUS
  Filled 2016-09-10 (×4): qty 0.15

## 2016-09-10 MED ORDER — SODIUM CHLORIDE 0.9% FLUSH
10.0000 mL | Freq: Two times a day (BID) | INTRAVENOUS | Status: DC
  Administered 2016-09-10 – 2016-09-11 (×3): 10 mL

## 2016-09-10 MED ORDER — SODIUM CHLORIDE 0.9% FLUSH: 10.0000 mL | INTRAVENOUS | Status: DC | PRN

## 2016-09-10 MED ORDER — INSULIN ASPART 100 UNIT/ML ~~LOC~~ SOLN
0.0000 [IU] | Freq: Three times a day (TID) | SUBCUTANEOUS | Status: DC
  Administered 2016-09-10: 2 [IU] via SUBCUTANEOUS
  Administered 2016-09-11: 3 [IU] via SUBCUTANEOUS
  Administered 2016-09-11 (×2): 5 [IU] via SUBCUTANEOUS

## 2016-09-10 MED ORDER — INSULIN ASPART 100 UNIT/ML ~~LOC~~ SOLN
0.0000 [IU] | Freq: Every day | SUBCUTANEOUS | Status: DC
  Administered 2016-09-10: 2 [IU] via SUBCUTANEOUS

## 2016-09-10 NOTE — Progress Notes (Signed)
Podiatry Progress Note  Subjective [name] [name] is POD 1 following debridement of his right foot with application of KCI Wound VAC.  Pain is currently controlled.  PICC line placed earlier today.  Noninvasive arterial study performed earlier today.  Wife at bedside.    Objective Vital signs in last 24 hours:   Temp:  [97.5 F (36.4 C)-98.3 F (36.8 C)] 97.7 F (36.5 C) (06/07 1300) Pulse Rate:  [68-82] 72 (06/07 1300) Resp:  [16-18] 18 (06/07 1300) BP: (99-131)/(37-48) 99/45 (06/07 1300) SpO2:  [92 %-98 %] 95 % (06/07 1300)  KCI Wound VAC operational at 125 mmHg continuous pressure.  Moderate serosanguineous exudate.  Calves soft and nontender.  Lab/Test Results  Recent Labs  09/08/16 1545 09/09/16 0514  WBC 17.3* 16.0*  HGB 12.9* 12.8*  HCT 39.4 39.0  PLT 226 226  NA 137 135  K 4.0 3.8  CL 100* 99*  CO2 28 28  BUN 33* 32*  CREATININE 0.92 0.84  GLUCOSE 190* 243*  CALCIUM 9.5 9.2    Recent Results (from the past 240 hour(s))  Aerobic Culture (superficial specimen)     Status: None   Collection Time: 09/01/16  3:25 PM  Result Value Ref Range Status   Specimen Description HEEL  Final   Special Requests NONE  Final   Gram Stain   Final    ABUNDANT WBC PRESENT,BOTH PMN AND MONONUCLEAR RARE GRAM POSITIVE COCCI IN PAIRS    Culture   Final    NORMAL SKIN FLORA Performed at [facility], [address] [place], [place] [address]    Report Status 09/04/2016 FINAL  Final  Wound or Superficial Culture     Status: None (Preliminary result)   Collection Time: 09/08/16  3:50 PM  Result Value Ref Range Status   Specimen Description FOOT RIGHT  Final   Special Requests NONE  Final   Gram Stain   Final    ABUNDANT WBC PRESENT, PREDOMINANTLY PMN ABUNDANT GRAM POSITIVE COCCI IN PAIRS IN CHAINS RARE GRAM POSITIVE RODS    Culture   Final    ABUNDANT VIRIDANS STREPTOCOCCUS SUSCEPTIBILITIES TO FOLLOW Performed at [facility], [place] [address] [place],   [address]    Report Status PENDING  Incomplete  Culture, blood (Routine X 2) w Reflex to ID Panel     Status: None (Preliminary result)   Collection Time: 09/08/16 10:19 PM  Result Value Ref Range Status   Specimen Description BLOOD RIGHT HAND  Final   Special Requests Blood Culture adequate volume  Final   Culture NO GROWTH 2 DAYS  Final   Report Status PENDING  Incomplete  Culture, blood (Routine X 2) w Reflex to ID Panel     Status: None (Preliminary result)   Collection Time: 09/08/16 10:24 PM  Result Value Ref Range Status   Specimen Description LEFT ANTECUBITAL  Final   Special Requests Blood Culture adequate volume  Final   Culture NO GROWTH 2 DAYS  Final   Report Status PENDING  Incomplete  Aerobic/Anaerobic Culture (surgical/deep wound)     Status: None (Preliminary result)   Collection Time: 09/09/16 11:15 AM  Result Value Ref Range Status   Specimen Description ABSCESS  Final   Special Requests NONE  Final   Gram Stain   Final    ABUNDANT WBC PRESENT,BOTH PMN AND MONONUCLEAR ABUNDANT GRAM POSITIVE COCCI IN PAIRS    Culture   Final    CULTURE REINCUBATED FOR BETTER GROWTH  Performed at [facility], [place] [address] [place], [place] [address]    Report Status PENDING  Incomplete     Mr Foot Right Wo Contrast  Result Date: 09/08/2016 CLINICAL DATA:  Right heel pain, nonhealing ulcer to the heel. EXAM: MRI OF THE RIGHT FOREFOOT WITHOUT CONTRAST TECHNIQUE: Multiplanar, multisequence MR imaging of the right hindfoot was performed. No intravenous contrast was administered. COMPARISON:  08/03/2016 MRI, 09/08/2016 radiographs FINDINGS: Once again, no T1 weighted images performed secondary to patient discomfort. Patient refused additional imaging. Proton density and T2 fat saturated axial images in the sagittal inversion recovery sequence were able to be portably. Bones/Joint/Cartilage The posterolateral corner of the calcaneus demonstrates subtle subcortical marrow edema and  an indistinct appearance of the cortical margin, series 4, image 20 and series 5, image 22. This is deep to an area of known ulceration and findings are concerning for changes of early osteomyelitis given technical limitations of an incomplete study and lack of IV contrast. Ligaments Intact lateral and medial ligamentous complexes crossing the ankle joint. Muscles and Tendons The flexor and extensor tendons crossing the ankle joint are of normal signal intensity morphology. Achilles tendon appears intact. Soft tissues Heel ulcer measuring approximately 3.5 x 1.7 x 2 cm. IMPRESSION: Soft tissue ulceration of the heel estimated at 3.5 x 1.7 x 2 cm with underlying cortical involvement of the posterolateral corner of the calcaneus and with subcortical marrow edema noted. Findings are suspicious for developing osteomyelitis. Electronically Signed   By: Ashley Royalty M.D.   On: 09/08/2016 18:17   US Arterial Seg Multiple  Result Date: 09/09/2016 CLINICAL DATA:  Nonhealing open wound of heel EXAM: NONINVASIVE PHYSIOLOGIC VASCULAR STUDY OF BILATERAL LOWER EXTREMITIES TECHNIQUE: Evaluation of both lower extremities was performed at rest, including calculation of ankle-brachial indices, multiple segmental pressure evaluation, segmental Doppler and segmental pulse volume recording. COMPARISON:  08/12/2012 FINDINGS: Right ABI:  Non calculable due to vascular noncompressibility Left ABI:  Non calculable due to vascular noncompressibility Right Lower Extremity: Biphasic arterial waveforms are noted distally. Multiple levels of vascular noncompressibility . Left Lower Extremity: Biphasic arterial waveforms distally. Multiple levels of vascular noncompressibility . IMPRESSION: Segmental noninvasive evaluation is limited due to extensive vascular noncompressibility presumably secondary to medial calcification. However, the normal distal arterial waveforms suggest no high-grade lower extremity arterial occlusive disease at rest. If  there is continued high clinical concern of hemodynamically significant lower extremity arterial occlusive disease, recommend MRA runoff for better evaluation. Electronically Signed   By: Lucrezia Europe M.D.   On: 09/09/2016 16:57    Medications Scheduled Meds: . amLODipine  5 mg Oral Q breakfast  . atorvastatin  10 mg Oral QPM  . calcium-vitamin D  2 tablet Oral Q breakfast  . cholecalciferol  1,000 Units Oral q morning - 10a  . digoxin  0.125 mg Oral Q breakfast  . insulin aspart  0-5 Units Subcutaneous QHS  . insulin aspart  0-9 Units Subcutaneous TID WC  . insulin aspart  5 Units Subcutaneous TID WC  . insulin glargine  15 Units Subcutaneous QHS  . ipratropium  2 spray Each Nare BID  . liraglutide  1.8 mg Subcutaneous Q breakfast  . lisinopril  40 mg Oral Q supper  . LORazepam  2 mg Oral QHS  . multivitamin with minerals  1 tablet Oral Q breakfast  . omega-3 acid ethyl esters  2 capsule Oral BID  . senna  1 tablet Oral BID  . sodium chloride flush  10-40  mL Intracatheter Q12H  . sodium chloride flush  3 mL Intravenous Q12H  . vitamin C  1,000 mg Oral Q breakfast  . vitamin E  400 Units Oral Q breakfast   Continuous Infusions: . sodium chloride    . ceFEPime (MAXIPIME) IV Stopped (09/10/16 1056)  . vancomycin Stopped (09/10/16 1345)   PRN Meds:.sodium chloride, acetaminophen **OR** acetaminophen, albuterol, HYDROcodone-acetaminophen, ondansetron **OR** ondansetron (ZOFRAN) IV, polyethylene glycol, sodium chloride flush, sodium chloride flush, traZODone  Assessment 1.  Osteomyelitis of right calcaneus. 2.  Puncture wound, right foot. 3.  Diabetes mellitus.  Plan Continue IV antibiotics per primary service.  Will change KCI Wound VAC tomorrow afternoon.  Non-weightbearing right lower extremity.  Recommend SNF given IV antibiotics, non-weightbearing status and risk of limb loss.  Recommend 6-8 weeks of IV antibiotics.  Will follow throughout admission.  Rinaldo Macqueen  IVAN 09/10/2016, 5:17 PM

## 2016-09-10 NOTE — Progress Notes (Signed)
Inpatient Diabetes Program Recommendations  AACE/ADA: New Consensus Statement on Inpatient Glycemic Control (2015)  Target Ranges:  Prepandial:   less than 140 mg/dL      Peak postprandial:   less than 180 mg/dL (1-2 hours)      Critically ill patients:  140 - 180 mg/dL   Results for [name] [name], [name] [name] (MRN [id number]) as of 09/10/2016 10:27  Ref. Range 09/09/2016 08:26 09/09/2016 08:56 09/09/2016 17:04 09/09/2016 21:42 09/10/2016 07:40  Glucose-Capillary Latest Ref Range: 65 - 99 mg/dL 211 (H) 183 (H) 258 (H) 249 (H) 262 (H)   Review of Glycemic Control  Current orders for Inpatient glycemic control: Lantus 10 units QHS, Novolog 5 units TID with meals, Novolog 0-9 units TID with meals, Victoza 1.8 mg QAM  Inpatient Diabetes Program Recommendations: Insulin - Basal: Please consider increasing Lantus to 15 units QHS.  Correction (SSI): Please consider ordering Novolog 0-5 units QHS for bedtime correction. Insulin - Meal Coverage: Please consider increasing meal coverage to Novolog 8 units TID with meals if patient eats at least 50% of meals.  Thanks, Barnie Alderman, RN, MSN, CDE Diabetes Coordinator Inpatient Diabetes Program [phone] (Team Pager from 8am to 5pm)

## 2016-09-10 NOTE — NC FL2 (Signed)
[place] LEVEL OF CARE SCREENING TOOL     IDENTIFICATION  Patient Name: [name] [name] Birthdate: 12/15/1934 Sex: male Admission Date (Current Location): 09/08/2016  [facility] and [place] Number:  [employer] and Address:  [facility] [address], [place]      Provider Number: [id number]  Attending Physician Name and Address:  Samuella Cota, MD  Relative Name and Phone Number:       Current Level of Care: Hospital Recommended Level of Care: [place] Prior Approval Number:    Date Approved/Denied:   PASRR Number: [id number]  Discharge Plan: SNF    Current Diagnoses: Patient Active Problem List   Diagnosis Date Noted  . Rt Heel Osteomyelitis ([place]) 09/08/2016  . Cellulitis of heel, right 09/08/2016  . Sepsis ([place]) 06/21/2016  . Hypoglycemia 06/21/2016  . Acute metabolic encephalopathy 37/16/9678  . Type 2 diabetes mellitus ([place])   . Chronic lymphocytic leukemia ([place])   . Encounter for therapeutic drug monitoring 05/16/2013  . Cardiac murmur 10/21/2011  . Long term current use of anticoagulant therapy 06/27/2010  . Mixed hyperlipidemia 05/21/2008  . CORONARY ATHEROSCLEROSIS NATIVE CORONARY ARTERY 05/21/2008  . Atrial fibrillation ([place]) 05/21/2008  . DIABETES 06/14/2007  . KNEE PAIN 06/14/2007    Orientation RESPIRATION BLADDER Height & Weight     Self, Time, Situation, Place  Normal Continent Weight: 286 lb 3.2 oz (129.8 kg) Height:  6' (182.9 cm)  BEHAVIORAL SYMPTOMS/MOOD NEUROLOGICAL BOWEL NUTRITION STATUS      Continent Diet (Carb modified)  AMBULATORY STATUS COMMUNICATION OF NEEDS Skin   Limited Assist Verbally Wound Vac (Incision Right foot, negative pressure wound therapy)                       Personal Care Assistance Level of Assistance  Bathing, Feeding, Dressing Bathing Assistance: Limited assistance Feeding assistance: Independent Dressing Assistance: Limited  assistance     Functional Limitations Info  Sight, Hearing, Speech Sight Info: Adequate Hearing Info: Adequate Speech Info: Adequate    SPECIAL CARE FACTORS FREQUENCY                       Contractures Contractures Info: Not present    Additional Factors Info  Code Status, Allergies, Psychotropic Code Status Info: Full code Allergies Info: Erythromycin, Penicillins Psychotropic Info: Ativan         Current Medications (09/10/2016):  This is the current hospital active medication list Current Facility-Administered Medications  Medication Dose Route Frequency Provider Last Rate Last Dose  . 0.9 %  sodium chloride infusion  250 mL Intravenous PRN Emokpae, Courage, MD      . acetaminophen (TYLENOL) tablet 650 mg  650 mg Oral Q6H PRN Emokpae, Courage, MD       Or  . acetaminophen (TYLENOL) suppository 650 mg  650 mg Rectal Q6H PRN Emokpae, Courage, MD      . albuterol (PROVENTIL) (2.5 MG/3ML) 0.083% nebulizer solution 2.5 mg  2.5 mg Nebulization Q2H PRN Emokpae, Courage, MD      . amLODipine (NORVASC) tablet 5 mg  5 mg Oral Q breakfast Emokpae, Courage, MD   5 mg at 09/10/16 0910  . atorvastatin (LIPITOR) tablet 10 mg  10 mg Oral QPM Emokpae, Courage, MD   10 mg at 09/09/16 1710  . calcium-vitamin D (OSCAL WITH D) 500-200 MG-UNIT per tablet 2 tablet  2 tablet Oral Q breakfast Roxan Hockey, MD  2 tablet at 09/10/16 0814  . ceFEPIme (MAXIPIME) 2 g in dextrose 5 % 50 mL IVPB  2 g Intravenous Q8H Samuella Cota, MD   Stopped at 09/10/16 1056  . cholecalciferol (VITAMIN D) tablet 1,000 Units  1,000 Units Oral q morning - 10a Denton Brick, Courage, MD   1,000 Units at 09/10/16 1025  . digoxin (LANOXIN) tablet 0.125 mg  0.125 mg Oral Q breakfast Emokpae, Courage, MD   0.125 mg at 09/10/16 0813  . HYDROcodone-acetaminophen (NORCO) 7.5-325 MG per tablet 1 tablet  1 tablet Oral Q4H PRN Roxan Hockey, MD   1 tablet at 09/09/16 1851  . insulin aspart (novoLOG) injection 0-9 Units   0-9 Units Subcutaneous TID WC Roxan Hockey, MD   3 Units at 09/10/16 1245  . insulin aspart (novoLOG) injection 5 Units  5 Units Subcutaneous TID WC Samuella Cota, MD   5 Units at 09/10/16 1244  . insulin glargine (LANTUS) injection 10 Units  10 Units Subcutaneous QHS Samuella Cota, MD   10 Units at 09/09/16 2300  . ipratropium (ATROVENT) 0.06 % nasal spray 2 spray  2 spray Each Nare BID Roxan Hockey, MD   2 spray at 09/10/16 1023  . liraglutide SOPN 1.8 mg  1.8 mg Subcutaneous Q breakfast Emokpae, Courage, MD      . lisinopril (PRINIVIL,ZESTRIL) tablet 40 mg  40 mg Oral Q supper Emokpae, Courage, MD   40 mg at 09/09/16 1710  . LORazepam (ATIVAN) tablet 2 mg  2 mg Oral QHS Emokpae, Courage, MD   2 mg at 09/09/16 2137  . multivitamin with minerals tablet 1 tablet  1 tablet Oral Q breakfast Roxan Hockey, MD   1 tablet at 09/10/16 0815  . omega-3 acid ethyl esters (LOVAZA) capsule 2 g  2 capsule Oral BID Denton Brick, Courage, MD   2 g at 09/10/16 1054  . ondansetron (ZOFRAN) tablet 4 mg  4 mg Oral Q6H PRN Emokpae, Courage, MD       Or  . ondansetron (ZOFRAN) injection 4 mg  4 mg Intravenous Q6H PRN Emokpae, Courage, MD      . polyethylene glycol (MIRALAX / GLYCOLAX) packet 17 g  17 g Oral Daily PRN Denton Brick, Courage, MD   17 g at 09/09/16 1853  . senna (SENOKOT) tablet 8.6 mg  1 tablet Oral BID Emokpae, Courage, MD   8.6 mg at 09/10/16 1025  . sodium chloride flush (NS) 0.9 % injection 3 mL  3 mL Intravenous Q12H Emokpae, Courage, MD   3 mL at 09/09/16 2149  . sodium chloride flush (NS) 0.9 % injection 3 mL  3 mL Intravenous PRN Emokpae, Courage, MD      . traZODone (DESYREL) tablet 50 mg  50 mg Oral QHS PRN Emokpae, Courage, MD      . vancomycin (VANCOCIN) IVPB 1000 mg/200 mL premix  1,000 mg Intravenous Q12H Emokpae, Courage, MD 200 mL/hr at 09/10/16 1245 1,000 mg at 09/10/16 1245  . vitamin C (ASCORBIC ACID) tablet 1,000 mg  1,000 mg Oral Q breakfast Denton Brick, Courage, MD   1,000 mg  at 09/10/16 0810  . vitamin E capsule 400 Units  400 Units Oral Q breakfast Roxan Hockey, MD   400 Units at 09/10/16 0815     Discharge Medications: Please see discharge summary for a list of discharge medications.  Relevant Imaging Results:  Relevant Lab Results:   Additional Information SSN [id number].   Patient will need continued IV antibiotics at discharge.  Benedicto Capozzi, Clydene Pugh, LCSW

## 2016-09-10 NOTE — Clinical Social Work Note (Signed)
Clinical Social Work Assessment  Patient Details  Name: [name] [name] MRN: [id number] Date of Birth: 17-Dec-1934  Date of referral:  09/10/16               Reason for consult:  Discharge Planning                Permission sought to share information with:    Permission granted to share information::     Name::        Agency::     Relationship::     Contact Information:  Patient's wife, [name] [name].   Housing/Transportation Living arrangements for the past 2 months:  Single Family Home Source of Information:  Spouse Patient Interpreter Needed:  None Criminal Activity/Legal Involvement Pertinent to Current Situation/Hospitalization:  No - Comment as needed Significant Relationships:  Spouse Lives with:    Do you feel safe going back to the place where you live?  Yes Need for family participation in patient care:  Yes (Comment)  Care giving concerns:  At baseline, patient has no care giving needs.   Social Worker assessment / plan:  Patient resides at home with his wife. Patient began using a walker for ambulation in March after becoming ill. [name] [name] stated that she is only interested in [facility] at this time.  Employment status:  Retired Forensic scientist:  Medicare PT Recommendations:  Not assessed at this time Information / Referral to community resources:  [place]  Patient/Family's Response to care:  Patient's wife is agreeable to SNF at [facility] only.   Patient/Family's Understanding of and Emotional Response to Diagnosis, Current Treatment, and Prognosis:  Family understands patient's diagnosis, prognosis and treatment.   Emotional Assessment Appearance:  Appears stated age Attitude/Demeanor/Rapport:  Unable to Assess Affect (typically observed):  Unable to Assess Orientation:  Oriented to Situation, Oriented to  Time, Oriented to Place, Oriented to Self Alcohol / Substance use:    Psych involvement (Current and /or in the community):  No  (Comment)  Discharge Needs  Concerns to be addressed:  Discharge Planning Concerns Readmission within the last 30 days:  No Current discharge risk:  None Barriers to Discharge:  No Barriers Identified   Ihor Gully, LCSW 09/10/2016, 1:06 PM

## 2016-09-10 NOTE — Progress Notes (Addendum)
PROGRESS NOTE  [name] [name]:[id number] DOB: [birth year] DOA: 09/08/2016 PCP: [name], MD  Brief Narrative: 82 year old man PMH diabetes, atrial fibrillation on anticoagulation, presented with nonhealing right heel ulcer. MRI revealed osteomyelitis.  Assessment/Plan #1: Right heel osteomyelitis secondary to nonhealing ulcer, complicated by diabetes. -Clinically stable. Wound VAC in place. Management per podiatry. -Continue empiric vancomycin and cefepime. Follow-up culture data.  #2: Diabetes mellitus. Hold Actos, Amaryl. -Somewhat hyperglycemic -Increase Lantus to 15 units daily. Continue meal coverage and sliding scale iInsulin. Add nighttime coverage.  #3: Atrial fibrillation.  Remains stable. Continue digoxin. Warfarin on hold. Resume when cleared by podiatry.  #4: Essential HTN -Stable. Continue amlodipine.    DVT prophylaxis: SCDs Code Status: full Family Communication: wife at bedside again 6/7 Disposition Plan: SNF    Murray Hodgkins, MD  [facility] Direct contact: [phone] --[address]  --[email or web address]; password [email or web address]  7PM-7AM contact night coverage as above 09/10/2016, 2:07 PM  LOS: 2 days   Consultants:  Podiatry   Procedures: 1.  Debridement of skin, subcutaneous tissue and fascia, right heel 2.  Application of Wound VAC, right heel  Antimicrobials:  Cefepime 6/6 >>  Vancomycin 6/5 >>  Interval history/Subjective: No complaints. Pain control. Eating very well. Breathing fine.  Objective: Vitals: Afebrile, temperature 98.3, pulse 82, blood pressure 131/48, SPO2 96% room air  Exam:     Constitutional: Appears calm, comfortable lying in bed.  Eyes: Pupils, irises, lids appear normal.  ENT: Grossly normal hearing. Lips and tongue appear normal.  Cardiovascular: Regular rate and rhythm. No murmur, rub or gallop.  Respiratory: Clear to auscultation bilaterally. No wheezes, rales or rhonchi. Normal respiratory  effort.  Abdomen: Soft, nondistended  Skin: Right foot has bandage in place. No rash seen elsewhere.  Psychiatric: Grossly normal mood and affect. Speech fluent and appropriate   I have personally reviewed the following:   Labs:  Blood sugars 183-262  Wound culture pending  Imaging studies:  Bilateral ABI: Limited exam but no evidence of high-grade lower extremity arterial occlusive disease.  Medical tests:     Test discussed with performing physician:    Decision to obtain old records:    Review and summation of old records:    Scheduled Meds: . amLODipine  5 mg Oral Q breakfast  . atorvastatin  10 mg Oral QPM  . calcium-vitamin D  2 tablet Oral Q breakfast  . cholecalciferol  1,000 Units Oral q morning - 10a  . digoxin  0.125 mg Oral Q breakfast  . insulin aspart  0-5 Units Subcutaneous QHS  . insulin aspart  0-9 Units Subcutaneous TID WC  . insulin aspart  5 Units Subcutaneous TID WC  . insulin glargine  15 Units Subcutaneous QHS  . ipratropium  2 spray Each Nare BID  . liraglutide  1.8 mg Subcutaneous Q breakfast  . lisinopril  40 mg Oral Q supper  . LORazepam  2 mg Oral QHS  . multivitamin with minerals  1 tablet Oral Q breakfast  . omega-3 acid ethyl esters  2 capsule Oral BID  . senna  1 tablet Oral BID  . sodium chloride flush  3 mL Intravenous Q12H  . vitamin C  1,000 mg Oral Q breakfast  . vitamin E  400 Units Oral Q breakfast   Continuous Infusions: . sodium chloride    . ceFEPime (MAXIPIME) IV Stopped (09/10/16 1056)  . vancomycin Stopped (09/10/16 1345)    Principal Problem:   Rt Heel Osteomyelitis (HCC) Active Problems:  Atrial fibrillation ([place])   Type 2 diabetes mellitus (HCC)   Cellulitis of heel, right   Essential hypertension   LOS: 2 days

## 2016-09-10 NOTE — Evaluation (Signed)
Physical Therapy Evaluation Patient Details Name: [name] [name] MRN: [id number] DOB: 04/24/1934 Today's Date: 09/10/2016   History of Present Illness  82 yo male with onset of R heel wound and now early osteomyelitis, debrided and wound vac applied by podiatrist.  Has cellulitis and is now NWB on R foot.  PMHx:  L foot toe amputations, DM, UTI, a-fib, leukemia, IgA nephropathy  Clinical Impression  Pt was seen for evaluation of his mobility in preparation for his transition to SNF.  He is more debilitated since his surgery and will need to increase his independence given the obstacles to enter his house and the weakness to compensate, which his wife cannot fully manage.  Will follow acutely for these goals and to increase his progression through to shorten the stay at rehab.      Follow Up Recommendations SNF    Equipment Recommendations  None recommended by PT    Recommendations for Other Services       Precautions / Restrictions Precautions Precautions: Fall Restrictions Weight Bearing Restrictions: Yes RLE Weight Bearing: Non weight bearing      Mobility  Bed Mobility Overal bed mobility: Modified Independent                Transfers Overall transfer level: Needs assistance Equipment used: 1 person hand held assist Transfers: Sit to/from Stand Sit to Stand: Min assistmod assist         General transfer comment: assisted pt to keep R foot from touching the floor  Ambulation/Gait             General Gait Details: declined  Stairs            Wheelchair Mobility    Modified Rankin (Stroke Patients Only)       Balance Overall balance assessment: Needs assistance Sitting-balance support: Feet supported Sitting balance-Leahy Scale: Good     Standing balance support: Bilateral upper extremity supported;Single extremity supported (one foot on the floor) Standing balance-Leahy Scale: Poor                               Pertinent  Vitals/Pain Pain Assessment: Faces Faces Pain Scale: Hurts little more Pain Location: R heel when suspended or near the edge of a pillow Pain Descriptors / Indicators: Sore Pain Intervention(s): Repositioned;Monitored during session    Home Living Family/patient expects to be discharged to:: Skilled nursing facility Living Arrangements: Spouse/significant other Available Help at Discharge: Family;Available 24 hours/day Type of Home: House Home Access: Stairs to enter   Entrance Stairs-Number of Steps: 2 no HR.  Home Layout: Two level;Able to live on main level with bedroom/bathroom Home Equipment: Gilford Rile - 2 wheels;Cane - single point;Bedside commode;Shower seat      Prior Function Level of Independence: Independent with assistive device(s)               Hand Dominance   Dominant Hand: Right    Extremity/Trunk Assessment                Communication   Communication: HOH  Cognition Arousal/Alertness: Awake/alert Behavior During Therapy: WFL for tasks assessed/performed Overall Cognitive Status: Within Functional Limits for tasks assessed                                        General Comments      Exercises  Assessment/Plan    PT Assessment Patient needs continued PT services  PT Problem List Decreased strength;Decreased range of motion;Decreased activity tolerance;Decreased balance;Decreased mobility;Decreased coordination;Decreased knowledge of use of DME;Decreased safety awareness;Obesity;Decreased skin integrity;Pain       PT Treatment Interventions DME instruction;Functional mobility training;Therapeutic activities;Therapeutic exercise;Balance training;Neuromuscular re-education;Patient/family education;Gait training;Stair training    PT Goals (Current goals can be found in the Care Plan section)  Acute Rehab PT Goals Patient Stated Goal: to get stronger and get back to walking PT Goal Formulation: With patient/family Time For  Goal Achievement: 09/24/16 Potential to Achieve Goals: Good    Frequency Min 3X/week   Barriers to discharge Inaccessible home environment stairs to enter house    Co-evaluation               AM-PAC PT "6 Clicks" Daily Activity  Outcome Measure Difficulty turning over in bed (including adjusting bedclothes, sheets and blankets)?: A Little Difficulty moving from lying on back to sitting on the side of the bed? : A Little Difficulty sitting down on and standing up from a chair with arms (e.g., wheelchair, bedside commode, etc,.)?: Total Help needed moving to and from a bed to chair (including a wheelchair)?: A Lot Help needed walking in hospital room?: Total Help needed climbing 3-5 steps with a railing? : Total 6 Click Score: 11    End of Session   Activity Tolerance: Treatment limited secondary to medical complications (Comment);Patient limited by fatigue (WB limitations) Patient left: in bed;with call bell/phone within reach;with family/visitor present Nurse Communication: Mobility status PT Visit Diagnosis: Difficulty in walking, not elsewhere classified (R26.2);Muscle weakness (generalized) (M62.81);Pain Pain - Right/Left: Right Pain - part of body: Ankle and joints of foot    Time: 1406-1430 PT Time Calculation (min) (ACUTE ONLY): 24 min   Charges:   PT Evaluation $PT Eval Moderate Complexity: 1 Procedure PT Treatments $Therapeutic Activity: 8-22 mins   PT G Codes:   PT G-Codes **NOT FOR INPATIENT CLASS** Functional Assessment Tool Used: AM-PAC 6 Clicks Basic Mobility   Ramond Dial 09/10/2016, 5:54 PM   5:55 PM, 09/10/16 Mee Hives, PT, MS Physical Therapist - [place] [phone] (Office)

## 2016-09-10 NOTE — Care Management Note (Signed)
Case Management Note  Patient Details  Name: [name] [name] MRN: [id number] Date of Birth: 1934-08-20  Subjective/Objective:                  Pt from home, admitted with osteo, wound vac and IV abx planned for DC. Per wife, MD requesting SNF for wound care and IV abx, pt/family in agreement. CSW made aware of DC plan.   Action/Plan: CSW will make arrangements for SNF. No CM needs anticipate, CM will follow to DC.   Expected Discharge Date:      09/11/2016            Expected Discharge Plan:  [place]  In-House Referral:  Clinical Social Work  Discharge planning Services  CM Consult  Post Acute Care Choice:  NA Choice offered to:  NA  Status of Service:  Completed, signed off  Sherald Barge, RN 09/10/2016, 11:00 AM

## 2016-09-11 ENCOUNTER — Inpatient Hospital Stay
Admission: RE | Admit: 2016-09-11 | Discharge: 2016-10-27 | Disposition: A | Discharge status: Home / Self Care | Payer: Medicare Other | Source: Ambulatory Visit | Attending: Internal Medicine | Admitting: Internal Medicine

## 2016-09-11 DIAGNOSIS — I2510 Atherosclerotic heart disease of native coronary artery without angina pectoris: Secondary | ICD-10-CM | POA: Diagnosis not present

## 2016-09-11 DIAGNOSIS — M86171 Other acute osteomyelitis, right ankle and foot: Secondary | ICD-10-CM | POA: Diagnosis not present

## 2016-09-11 DIAGNOSIS — R319 Hematuria, unspecified: Secondary | ICD-10-CM | POA: Diagnosis not present

## 2016-09-11 DIAGNOSIS — S91301D Unspecified open wound, right foot, subsequent encounter: Secondary | ICD-10-CM | POA: Diagnosis not present

## 2016-09-11 DIAGNOSIS — E11628 Type 2 diabetes mellitus with other skin complications: Secondary | ICD-10-CM | POA: Diagnosis not present

## 2016-09-11 DIAGNOSIS — C9110 Chronic lymphocytic leukemia of B-cell type not having achieved remission: Secondary | ICD-10-CM | POA: Diagnosis not present

## 2016-09-11 DIAGNOSIS — C9190 Lymphoid leukemia, unspecified not having achieved remission: Secondary | ICD-10-CM | POA: Diagnosis not present

## 2016-09-11 DIAGNOSIS — E782 Mixed hyperlipidemia: Secondary | ICD-10-CM | POA: Diagnosis not present

## 2016-09-11 DIAGNOSIS — M6281 Muscle weakness (generalized): Secondary | ICD-10-CM | POA: Diagnosis not present

## 2016-09-11 DIAGNOSIS — E118 Type 2 diabetes mellitus with unspecified complications: Secondary | ICD-10-CM | POA: Diagnosis not present

## 2016-09-11 DIAGNOSIS — R262 Difficulty in walking, not elsewhere classified: Secondary | ICD-10-CM | POA: Diagnosis not present

## 2016-09-11 DIAGNOSIS — E785 Hyperlipidemia, unspecified: Secondary | ICD-10-CM | POA: Diagnosis not present

## 2016-09-11 DIAGNOSIS — M8618 Other acute osteomyelitis, other site: Secondary | ICD-10-CM | POA: Diagnosis not present

## 2016-09-11 DIAGNOSIS — B954 Other streptococcus as the cause of diseases classified elsewhere: Secondary | ICD-10-CM | POA: Diagnosis not present

## 2016-09-11 DIAGNOSIS — L03115 Cellulitis of right lower limb: Secondary | ICD-10-CM | POA: Diagnosis not present

## 2016-09-11 DIAGNOSIS — I482 Chronic atrial fibrillation: Secondary | ICD-10-CM | POA: Diagnosis not present

## 2016-09-11 DIAGNOSIS — E1142 Type 2 diabetes mellitus with diabetic polyneuropathy: Secondary | ICD-10-CM | POA: Diagnosis not present

## 2016-09-11 DIAGNOSIS — E11649 Type 2 diabetes mellitus with hypoglycemia without coma: Secondary | ICD-10-CM | POA: Diagnosis not present

## 2016-09-11 DIAGNOSIS — Z978 Presence of other specified devices: Secondary | ICD-10-CM | POA: Diagnosis not present

## 2016-09-11 DIAGNOSIS — Z7901 Long term (current) use of anticoagulants: Secondary | ICD-10-CM | POA: Diagnosis not present

## 2016-09-11 DIAGNOSIS — S91331D Puncture wound without foreign body, right foot, subsequent encounter: Secondary | ICD-10-CM | POA: Diagnosis not present

## 2016-09-11 DIAGNOSIS — M8660 Other chronic osteomyelitis, unspecified site: Secondary | ICD-10-CM | POA: Diagnosis not present

## 2016-09-11 DIAGNOSIS — I4891 Unspecified atrial fibrillation: Secondary | ICD-10-CM | POA: Diagnosis not present

## 2016-09-11 DIAGNOSIS — I10 Essential (primary) hypertension: Secondary | ICD-10-CM | POA: Diagnosis not present

## 2016-09-11 DIAGNOSIS — L03125 Acute lymphangitis of right lower limb: Secondary | ICD-10-CM | POA: Diagnosis not present

## 2016-09-11 LAB — AEROBIC CULTURE  (SUPERFICIAL SPECIMEN)

## 2016-09-11 LAB — AEROBIC CULTURE W GRAM STAIN (SUPERFICIAL SPECIMEN)

## 2016-09-11 LAB — BASIC METABOLIC PANEL
Anion gap: 7 (ref 5–15)
BUN: 23 mg/dL — ABNORMAL HIGH (ref 6–20)
CALCIUM: 9.1 mg/dL (ref 8.9–10.3)
CO2: 29 mmol/L (ref 22–32)
CREATININE: 0.71 mg/dL (ref 0.61–1.24)
Chloride: 101 mmol/L (ref 101–111)
GFR calc Af Amer: >60 mL/min (ref >60)
GFR calc non Af Amer: >60 mL/min (ref >60)
GLUCOSE: 285 mg/dL — AB (ref 65–99)
Potassium: 3.8 mmol/L (ref 3.5–5.1)
Sodium: 137 mmol/L (ref 135–145)

## 2016-09-11 LAB — GLUCOSE, CAPILLARY
GLUCOSE-CAPILLARY: 264 mg/dL — AB (ref 65–99)
GLUCOSE-CAPILLARY: 231 mg/dL — AB (ref 65–99)
Glucose-Capillary: 292 mg/dL — ABNORMAL HIGH (ref 65–99)

## 2016-09-11 LAB — VANCOMYCIN, TROUGH: Vancomycin Tr: 13 ug/mL — ABNORMAL LOW (ref 15–20)

## 2016-09-11 MED ORDER — DEXTROSE 5 % IV SOLN
2.0000 g | INTRAVENOUS | Status: DC
  Administered 2016-09-11: 2 g via INTRAVENOUS
  Filled 2016-09-11 (×4): qty 2

## 2016-09-11 MED ORDER — MAGNESIUM HYDROXIDE 400 MG/5ML PO SUSP
15.0000 mL | Freq: Every day | ORAL | Status: DC | PRN
  Administered 2016-09-11: 15 mL via ORAL
  Filled 2016-09-11: qty 30

## 2016-09-11 MED ORDER — DEXTROSE 5 % IV SOLN: 2.0000 g | INTRAVENOUS | Status: DC

## 2016-09-11 MED ORDER — LORAZEPAM 1 MG PO TABS: 2.0000 mg | ORAL_TABLET | Freq: Every day | ORAL | 0 refills | Status: DC

## 2016-09-11 MED ORDER — BISACODYL 10 MG RE SUPP: 10.0000 mg | Freq: Every day | RECTAL | Status: DC | PRN

## 2016-09-11 MED ORDER — POLYETHYLENE GLYCOL 3350 17 G PO PACK: 17.0000 g | PACK | Freq: Two times a day (BID) | ORAL | Status: DC

## 2016-09-11 MED ORDER — LEVEMIR FLEXTOUCH 100 UNIT/ML ~~LOC~~ SOPN: 15.0000 [IU] | PEN_INJECTOR | Freq: Every day | SUBCUTANEOUS | Status: DC

## 2016-09-11 MED ORDER — VANCOMYCIN HCL 10 G IV SOLR
1250.0000 mg | Freq: Two times a day (BID) | INTRAVENOUS | Status: DC
  Filled 2016-09-11 (×6): qty 1250

## 2016-09-11 NOTE — Clinical Social Work Placement (Signed)
   CLINICAL SOCIAL WORK PLACEMENT  NOTE  Date:  09/11/2016  Patient Details  Name: [name] [name] MRN: [id number] Date of Birth: July 28, 1934  Clinical Social Work is seeking post-discharge placement for this patient at the [place] level of care (*CSW will initial, date and re-position this form in  chart as items are completed):  Yes   Patient/family provided with [facility] Work Department's list of facilities offering this level of care within the geographic area requested by the patient (or if unable, by the patient's family).  Yes   Patient/family informed of their freedom to choose among providers that offer the needed level of care, that participate in Medicare, Medicaid or managed care program needed by the patient, have an available bed and are willing to accept the patient.  Yes   Patient/family informed of [facility]'s ownership interest in [facility] and [facility], as well as of the fact that they are under no obligation to receive care at these facilities.  PASRR submitted to EDS on 09/10/16     PASRR number received on 09/10/16     Existing PASRR number confirmed on       FL2 transmitted to all facilities in geographic area requested by pt/family on 09/11/16     FL2 transmitted to all facilities within larger geographic area on       Patient informed that his/her managed care company has contracts with or will negotiate with certain facilities, including the following:        Yes   Patient/family informed of bed offers received.  Patient chooses bed at [facility]     Physician recommends and patient chooses bed at      Patient to be transferred to [facility] on  .  Patient to be transferred to facility by       Patient family notified on   of transfer.  Name of family member notified:        PHYSICIAN       Additional Comment:    _______________________________________________ Ihor Gully, LCSW 09/11/2016, 12:53 PM

## 2016-09-11 NOTE — Progress Notes (Signed)
Pharmacy Antibiotic Note  [name] [name] is a 82 y.o. male admitted on 09/08/2016 with sepsis / osteomyelitis.  Pharmacy has been consulted for Vancomycin and cefepime dosing.  Vanc trough is slightly below target  Plan:  Cefepime 2gm IV q8h  Increase Vancomycin to 1250mg  IV q12h (goal trough 15-20) Check trough weekly or sooner if warranted Monitor labs, renal fxn, progress and c/s Deescalate ABX when improved / appropriate.    Height: 6' (182.9 cm) Weight: 286 lb 3.2 oz (129.8 kg) IBW/kg (Calculated) : 77.6  Temp (24hrs), Avg:97.9 F (36.6 C), Min:97.7 F (36.5 C), Max:98 F (36.7 C)   Recent Labs Lab 09/08/16 1545 09/09/16 0514 09/11/16 0513 09/11/16 0956  WBC 17.3* 16.0*  --   --   CREATININE 0.92 0.84 0.71  --   VANCOTROUGH  --   --   --  13*    Estimated Creatinine Clearance: 104.3 mL/min (by C-G formula based on SCr of 0.71 mg/dL).    Allergies  Allergen Reactions  . Erythromycin Other (See Comments)  . Penicillins Hives and Other (See Comments)    Has patient had a PCN reaction causing immediate rash, facial/tongue/throat swelling, SOB or lightheadedness with hypotension: No Has patient had a PCN reaction causing severe rash involving mucus membranes or skin necrosis: No Has patient had a PCN reaction that required hospitalization No Has patient had a PCN reaction occurring within the last 10 years: No If all of the above answers are "NO", then may proceed with Cephalosporin use.  Has tolerated Rocephin   Antimicrobials this admission: Cefepime 6/ 6>> Vancomycin 6/5 >>  Aztreonam 6/5 >> 6/6  Dose adjustments this admission: 6/8 increased to 1250mg  q12h  Microbiology results:  6/5BCx: ngtd  6/6 Surgical WoundCx: pending  6/5 foot Cx: GPC, rare GPR  08/11/16 MRSA PCR is neg  Thank you for allowing pharmacy to be a part of this patient's care.  Hart Robinsons, PharmD Clinical Pharmacist Pager:  [phone] 09/11/2016   09/11/2016 12:34 PM

## 2016-09-11 NOTE — Progress Notes (Signed)
Patient being discharged to [facility], report called and given to Verlon Au RN. Vital signs stable.Patient has on going wound care,wound vac to right heel. PICC line single lumen to right upper arm clean dry and intact. Family at bedside. Staff to accompany patient to awaiting floor.

## 2016-09-11 NOTE — Progress Notes (Addendum)
Inpatient Diabetes Program Recommendations  AACE/ADA: New Consensus Statement on Inpatient Glycemic Control (2015)  Target Ranges:  Prepandial:   less than 140 mg/dL      Peak postprandial:   less than 180 mg/dL (1-2 hours)      Critically ill patients:  140 - 180 mg/dL  Results for NAGEE, GOATES (MRN [id number]) as of 09/11/2016 09:47  Ref. Range 09/10/2016 07:40 09/10/2016 11:28 09/10/2016 17:21 09/10/2016 20:41 09/11/2016 07:34  Glucose-Capillary Latest Ref Range: 65 - 99 mg/dL 262 (H) 234 (H) 189 (H) 227 (H) 292 (H)    Review of Glycemic Control  Current orders for Inpatient glycemic control: Lantus 15 units QHS, Novolog 5 units TID with meals, Novolog 0-9 units TID with meals, Novolog 0-5 units QHS, Victoza 1.8 mg QAM  Inpatient Diabetes Program Recommendations: Insulin - Basal: Please consider increasing Lantus to 20 units QHS.  Insulin - Meal Coverage:Please consider increasing meal coverage to Novolog 8 units TID with meals if patient eats at least 50% of meals. Insulin-Correction: Please consider increasing Novolog correction to moderate scale (0-15 units).  Thanks, Barnie Alderman, RN, MSN, CDE Diabetes Coordinator Inpatient Diabetes Program [phone] (Team Pager from 8am to 5pm)

## 2016-09-11 NOTE — Discharge Summary (Signed)
Physician Discharge Summary  [name] [name] GHW:[id number] DOB: Aug 29, 1934 DOA: 09/08/2016  PCP: [name] Blitz, MD  Admit date: 09/08/2016 Discharge date: 09/11/2016  Recommendations for Outpatient Follow-up:  1. Ongoing wound care, wound VAC management per podiatry [name] [name] for questions and further direction. 2. 6 weeks IV ceftriaxone for osteomyelitis of the heel. Recommend laboratory monitoring and dose adjustment per pharmacy protocol 3. Infectious disease office in [place] will contact the patient to arrange appointment in approximately 6 weeks    Contact information for follow-up providers    [facility] for Infectious Disease Follow up.   Specialty:  Infectious Diseases Why:  office will contact you for appointment Contact information: [place], [place] [id number] [place] [phone]           Contact information for after-discharge care    [facility] Follow up.   Specialty:  Skilled Nursing Facility Contact information: [address] [place] [phone]                    Discharge Diagnoses:  1. 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 Diabetes mellitus type 2 3. Atrial fibrillation 4. Essential hypertension  Discharge Condition: Improved Disposition: Skilled nursing facility for wound VAC, IV antibiotics  Diet recommendation: Diabetic diet  Filed Weights   09/08/16 1504 09/08/16 2300  Weight: 129.3 kg (285 lb) 129.8 kg (286 lb 3.2 oz)    History of present illness:  82 year old man PMH diabetes, atrial fibrillation on anticoagulation, presented with nonhealing right heel ulcer. MRI revealed osteomyelitis.  Hospital Course:  Patient was admitted, seen by podiatry and underwent debridement of the skin, subcutaneous tissue and fascia with subsequent application of wound VAC. Initially  treated with empiric antibiotics, narrowed to ceftriaxone based on culture data after discussion with infectious disease. Hospitalization was uncomplicated. Plan transfer skilled nursing facility for ongoing wound care and IV antibiotics.  #1: Right heel osteomyelitis secondary to nonhealing ulcer, complicated by diabetes. Wound culture positive for abundant viridans streptococcus. -doing well clinically. Management of wound vac per [name] [name]--d/w with him ID recs. Ok to discharge today. -discussed with ID [name] [name], recommends 6 weeks IV ceftriaxone, could potentially convert to Levaquin thereafter. Will f/u with ID as outpatient.  #2: Diabetes mellitus type 2 -Mildly hyperglycemic but overall stable. Continue current management with insulin.  #3: Atrial fibrillation.  -Stable, continue digoxin  #4: Essential HTN Remains stable. Continue amlodipine.  Consultants:  Podiatry   Procedures: 1. Debridement of skin, subcutaneous tissue and fascia, right heel 2. Application of Wound VAC, right heel  Antimicrobials:  Ceftriaxone 6/8 >> 7/17  Cefepime 6/6 >> 6/8  Vancomycin 6/5 >> 6/8   Today's assessment: S: Constipated, otherwise doing well. O: Vitals: Temperature 98.0, respirations 16, pulse 74, blood pressure 127/45, SPO2 97% on room air  Exam:     Constitutional: Appears comfortable, calm lying in bed  Respiratory clear to auscultation bilaterally. No wheezes, rales or rhonchi. Normal respiratory effort.  Cardiovascular regular rate and rhythm. No murmur, rub or gallop.   Abdomen soft nontender nondistended.  Psychiatric. Grossly normal mood and affect. Speech fluent and appropriate.  Right foot is bandaged, wound VAC appears to be in place  Discharge Instructions   Allergies as of 09/11/2016      Reactions   Erythromycin Other (See Comments)   Penicillins Hives, Other (See Comments)   Has patient had a PCN  reaction causing immediate rash,  facial/tongue/throat swelling, SOB or lightheadedness with hypotension: No Has patient had a PCN reaction causing severe rash involving mucus membranes or skin necrosis: No Has patient had a PCN reaction that required hospitalization No Has patient had a PCN reaction occurring within the last 10 years: No If all of the above answers are "NO", then may proceed with Cephalosporin use. Has tolerated Rocephin      Medication List    STOP taking these medications   cefdinir 300 MG capsule Commonly known as:  OMNICEF   clindamycin 300 MG capsule Commonly known as:  CLEOCIN     TAKE these medications   acetaminophen 650 MG CR tablet Commonly known as:  TYLENOL Take 650 mg by mouth every 8 (eight) hours as needed for pain.   amLODipine 5 MG tablet Commonly known as:  NORVASC Take 5 mg by mouth daily with breakfast.   atorvastatin 10 MG tablet Commonly known as:  LIPITOR Take 10 mg by mouth every evening.   CALCIUM 600+D 600-800 MG-UNIT Tabs Generic drug:  Calcium Carb-Cholecalciferol Take 2 tablets by mouth daily with breakfast.   cefTRIAXone 2 g in dextrose 5 % 50 mL Inject 2 g into the vein daily. Last dose 7/17.   cholecalciferol 1000 units tablet Commonly known as:  VITAMIN D Take 1,000 Units by mouth every morning.   ciclopirox 0.77 % cream Commonly known as:  LOPROX Apply 1 application topically daily as needed (for rash on foot).   Co Q-10 100 MG Caps Take 100 mg by mouth daily with breakfast.   digoxin 0.125 MG tablet Commonly known as:  LANOXIN Take 0.125 mg by mouth daily with breakfast.   FISH OIL + D3 1200-1000 MG-UNIT Caps Take 2 capsules by mouth 2 (two) times daily.   glimepiride 2 MG tablet Commonly known as:  AMARYL Take 2 mg by mouth daily with breakfast.   hydrochlorothiazide 25 MG tablet Commonly known as:  HYDRODIURIL Take 25 mg by mouth daily with breakfast.   HYDROcodone-acetaminophen 7.5-325 MG tablet Commonly known as:  NORCO Take 1  tablet by mouth every 4 (four) hours as needed for moderate pain.   ipratropium 0.06 % nasal spray Commonly known as:  ATROVENT Place 2 sprays into both nostrils 2 (two) times daily.   LEVEMIR FLEXTOUCH 100 UNIT/ML Pen Generic drug:  Insulin Detemir Inject 15 Units into the skin daily with breakfast. What changed:  how much to take   lisinopril 20 MG tablet Commonly known as:  PRINIVIL,ZESTRIL Take 40 mg by mouth daily with supper.   LORazepam 1 MG tablet Commonly known as:  ATIVAN Take 2 tablets (2 mg total) by mouth at bedtime.   multivitamin with minerals Tabs tablet Take 1 tablet by mouth daily with breakfast.   NOVOLOG FLEXPEN 100 UNIT/ML FlexPen Generic drug:  insulin aspart Inject 4-18 Units into the skin 2 (two) times daily before a meal. Pt uses per sliding scale before lunch and dinner.   pioglitazone 30 MG tablet Commonly known as:  ACTOS Take 30 mg by mouth every evening.   VICTOZA 18 MG/3ML Sopn Generic drug:  liraglutide Inject 1.8 mg into the skin daily with breakfast.   vitamin C 1000 MG tablet Take 1,000 mg by mouth daily with breakfast.   vitamin E 400 UNIT capsule Take 400 Units by mouth daily with breakfast.   warfarin 5 MG tablet Commonly known as:  COUMADIN Take 5 mg by mouth every evening.  Allergies  Allergen Reactions  . Erythromycin Other (See Comments)  . Penicillins Hives and Other (See Comments)    Has patient had a PCN reaction causing immediate rash, facial/tongue/throat swelling, SOB or lightheadedness with hypotension: No Has patient had a PCN reaction causing severe rash involving mucus membranes or skin necrosis: No Has patient had a PCN reaction that required hospitalization No Has patient had a PCN reaction occurring within the last 10 years: No If all of the above answers are "NO", then may proceed with Cephalosporin use.  Has tolerated Rocephin    The results of significant diagnostics from this hospitalization  (including imaging, microbiology, ancillary and laboratory) are listed below for reference.    Significant Diagnostic Studies: Mr Foot Right Wo Contrast  Result Date: 09/08/2016 CLINICAL DATA:  Right heel pain, nonhealing ulcer to the heel. EXAM: MRI OF THE RIGHT FOREFOOT WITHOUT CONTRAST TECHNIQUE: Multiplanar, multisequence MR imaging of the right hindfoot was performed. No intravenous contrast was administered. COMPARISON:  08/03/2016 MRI, 09/08/2016 radiographs FINDINGS: Once again, no T1 weighted images performed secondary to patient discomfort. Patient refused additional imaging. Proton density and T2 fat saturated axial images in the sagittal inversion recovery sequence were able to be portably. Bones/Joint/Cartilage The posterolateral corner of the calcaneus demonstrates subtle subcortical marrow edema and an indistinct appearance of the cortical margin, series 4, image 20 and series 5, image 22. This is deep to an area of known ulceration and findings are concerning for changes of early osteomyelitis given technical limitations of an incomplete study and lack of IV contrast. Ligaments Intact lateral and medial ligamentous complexes crossing the ankle joint. Muscles and Tendons The flexor and extensor tendons crossing the ankle joint are of normal signal intensity morphology. Achilles tendon appears intact. Soft tissues Heel ulcer measuring approximately 3.5 x 1.7 x 2 cm. IMPRESSION: Soft tissue ulceration of the heel estimated at 3.5 x 1.7 x 2 cm with underlying cortical involvement of the posterolateral corner of the calcaneus and with subcortical marrow edema noted. Findings are suspicious for developing osteomyelitis. Electronically Signed   By: Ashley Royalty M.D.   On: 09/08/2016 18:17   US Arterial Seg Multiple  Result Date: 09/09/2016 CLINICAL DATA:  Nonhealing open wound of heel EXAM: NONINVASIVE PHYSIOLOGIC VASCULAR STUDY OF BILATERAL LOWER EXTREMITIES TECHNIQUE: Evaluation of both lower  extremities was performed at rest, including calculation of ankle-brachial indices, multiple segmental pressure evaluation, segmental Doppler and segmental pulse volume recording. COMPARISON:  08/12/2012 FINDINGS: Right ABI:  Non calculable due to vascular noncompressibility Left ABI:  Non calculable due to vascular noncompressibility Right Lower Extremity: Biphasic arterial waveforms are noted distally. Multiple levels of vascular noncompressibility . Left Lower Extremity: Biphasic arterial waveforms distally. Multiple levels of vascular noncompressibility . IMPRESSION: Segmental noninvasive evaluation is limited due to extensive vascular noncompressibility presumably secondary to medial calcification. However, the normal distal arterial waveforms suggest no high-grade lower extremity arterial occlusive disease at rest. If there is continued high clinical concern of hemodynamically significant lower extremity arterial occlusive disease, recommend MRA runoff for better evaluation. Electronically Signed   By: Lucrezia Europe M.D.   On: 09/09/2016 16:57   Dg Foot Complete Right  Result Date: 09/08/2016 CLINICAL DATA:  Right heel ulcer worsening. EXAM: RIGHT FOOT COMPLETE - 3+ VIEW COMPARISON:  None. FINDINGS: Mild degenerative change over the first MTP joint and interphalangeal joints. No evidence of acute fracture or dislocation. No evidence of bone destruction to suggest osteomyelitis. No significant air in the soft tissues. Mild soft tissue  irregularity over the posterior soft tissues of the ankle/heel compatible with known ulceration. Minimal spurring over the inferior posterior calcaneus. Small vessel atherosclerotic disease. IMPRESSION: Soft tissue changes compatible with known heel ulcer. No underlying bony abnormality. Mild degenerative changes as described. Electronically Signed   By: Marin Olp M.D.   On: 09/08/2016 17:13    Microbiology: Recent Results (from the past 240 hour(s))  Aerobic Culture  (superficial specimen)     Status: None   Collection Time: 09/01/16  3:25 PM  Result Value Ref Range Status   Specimen Description HEEL  Final   Special Requests NONE  Final   Gram Stain   Final    ABUNDANT WBC PRESENT,BOTH PMN AND MONONUCLEAR RARE GRAM POSITIVE COCCI IN PAIRS    Culture   Final    NORMAL SKIN FLORA Performed at [facility], [address] [place], [place] [address]    Report Status 09/04/2016 FINAL  Final  Wound or Superficial Culture     Status: None   Collection Time: 09/08/16  3:50 PM  Result Value Ref Range Status   Specimen Description FOOT RIGHT  Final   Special Requests NONE  Final   Gram Stain   Final    ABUNDANT WBC PRESENT, PREDOMINANTLY PMN ABUNDANT GRAM POSITIVE COCCI IN PAIRS IN CHAINS RARE GRAM POSITIVE RODS Performed at [facility], [place] [address], [place], [place] [address]    Culture ABUNDANT VIRIDANS STREPTOCOCCUS  Final   Report Status 09/11/2016 FINAL  Final   Organism ID, Bacteria VIRIDANS STREPTOCOCCUS  Final      Susceptibility   Viridans streptococcus - MIC*    PENICILLIN <=0.06 SENSITIVE Sensitive     CEFTRIAXONE 0.25 SENSITIVE Sensitive     ERYTHROMYCIN >=8 RESISTANT Resistant     LEVOFLOXACIN 0.5 SENSITIVE Sensitive     VANCOMYCIN 0.5 SENSITIVE Sensitive     * ABUNDANT VIRIDANS STREPTOCOCCUS  Culture, blood (Routine X 2) w Reflex to ID Panel     Status: None (Preliminary result)   Collection Time: 09/08/16 10:19 PM  Result Value Ref Range Status   Specimen Description BLOOD RIGHT HAND  Final   Special Requests Blood Culture adequate volume  Final   Culture NO GROWTH 3 DAYS  Final   Report Status PENDING  Incomplete  Culture, blood (Routine X 2) w Reflex to ID Panel     Status: None (Preliminary result)   Collection Time: 09/08/16 10:24 PM  Result Value Ref Range Status   Specimen Description LEFT ANTECUBITAL  Final   Special Requests Blood Culture adequate volume  Final   Culture NO GROWTH 3 DAYS  Final    Report Status PENDING  Incomplete  Aerobic/Anaerobic Culture (surgical/deep wound)     Status: None (Preliminary result)   Collection Time: 09/09/16 11:15 AM  Result Value Ref Range Status   Specimen Description ABSCESS  Final   Special Requests NONE  Final   Gram Stain   Final    ABUNDANT WBC PRESENT,BOTH PMN AND MONONUCLEAR ABUNDANT GRAM POSITIVE COCCI IN PAIRS    Culture   Final    ABUNDANT VIRIDANS STREPTOCOCCUS CULTURE REINCUBATED FOR BETTER GROWTH Performed at [facility], [address] [place], [place] [address]    Report Status PENDING  Incomplete     Labs: Basic Metabolic Panel:  Recent Labs Lab 09/08/16 1545 09/09/16 0514 09/11/16 0513  NA 137 135 137  K 4.0 3.8 3.8  CL 100* 99* 101  CO2 28  28 29  GLUCOSE 190* 243* 285*  BUN 33* 32* 23*  CREATININE 0.92 0.84 0.71  CALCIUM 9.5 9.2 9.1   Liver Function Tests:  Recent Labs Lab 09/08/16 1545  AST 19  ALT 14*  ALKPHOS 61  BILITOT 0.8  PROT 6.3*  ALBUMIN 3.5   CBC:  Recent Labs Lab 09/08/16 1545 09/09/16 0514  WBC 17.3* 16.0*  NEUTROABS 5.7  --   HGB 12.9* 12.8*  HCT 39.4 39.0  MCV 88.5 89.0  PLT 226 226    CBG:  Recent Labs Lab 09/10/16 1128 09/10/16 1721 09/10/16 2041 09/11/16 0734 09/11/16 1132  GLUCAP 234* 189* 227* 292* 264*    Principal Problem:   Rt Heel Osteomyelitis (HCC) Active Problems:   Atrial fibrillation ([place])   Type 2 diabetes mellitus ([place])   Cellulitis of heel, right   Essential hypertension   Time coordinating discharge: 40 minutes  Signed:  Murray Hodgkins, MD [facility] 09/11/2016, 2:28 PM

## 2016-09-11 NOTE — Care Management Important Message (Signed)
Important Message  Patient Details  Name: [name] [name] MRN: [id number] Date of Birth: 28-Oct-1934   Medicare Important Message Given:  Yes    Sherald Barge, RN 09/11/2016, 10:05 AM

## 2016-09-11 NOTE — Progress Notes (Signed)
PROGRESS NOTE  [name] [name] OEU:[id number] DOB: November 06, 1934 DOA: 09/08/2016 PCP: [name], MD  Brief Narrative: 82 year old man PMH diabetes, atrial fibrillation on anticoagulation, presented with nonhealing right heel ulcer. MRI revealed osteomyelitis.  Assessment/Plan #1: Right heel osteomyelitis secondary to nonhealing ulcer, complicated by diabetes. Wound culture positive for abundant viridans streptococcus. -doing well clinically. Management of wound vac per Dr. McKinney--d/w with him ID recs. Ok to discharge today. -discussed with ID Dr. Johnnye Sima, recommends 6 weeks IV ceftriaxone, could potentially convert to Levaquin thereafter. Will f/u with ID as outpatient.  #2: Diabetes mellitus type 2 -Mildly hyperglycemic but overall stable. Continue current management with insulin.  #3: Atrial fibrillation.  -Stable, continue digoxin  #4: Essential HTN Remains stable. Continue amlodipine.   DVT prophylaxis: SCDs Code Status: full Family Communication: wife at bedside again 6/8 Disposition Plan: SNF    Murray Hodgkins, MD  [facility] Direct contact: [phone] --Via amion app OR  --[email or web address]; password [email or web address]  7PM-7AM contact night coverage as above 09/11/2016, 1:44 PM  LOS: 3 days   Consultants:  Podiatry   Procedures: 1.  Debridement of skin, subcutaneous tissue and fascia, right heel 2.  Application of Wound VAC, right heel  Antimicrobials:  Cefepime 6/6 >>  Vancomycin 6/5 >>  Interval history/Subjective: Constipated. Otherwise doing well.  Objective: Vitals: Temperature 98.0, respirations 16, pulse 74, blood pressure 127/45, SPO2 97% on room air  Exam:     Constitutional: Appears comfortable, calm lying in bed  Respiratory clear to auscultation bilaterally. No wheezes, rales or rhonchi. Normal respiratory effort.  Cardiovascular regular rate and rhythm. No murmur, rub or gallop.   Abdomen soft nontender nondistended.  Psychiatric. Grossly  normal mood and affect. Speech fluent and appropriate.  Right foot is bandaged, wound VAC appears to be in place   I have personally reviewed the following:   Labs:  Blood sugars are stable.  Basic metabolic panel unremarkable   Right foot culture noted  Imaging studies:    Medical tests:     Test discussed with performing physician:    Decision to obtain old records:    Review and summation of old records:    Scheduled Meds: . amLODipine  5 mg Oral Q breakfast  . atorvastatin  10 mg Oral QPM  . calcium-vitamin D  2 tablet Oral Q breakfast  . cholecalciferol  1,000 Units Oral q morning - 10a  . digoxin  0.125 mg Oral Q breakfast  . insulin aspart  0-5 Units Subcutaneous QHS  . insulin aspart  0-9 Units Subcutaneous TID WC  . insulin aspart  5 Units Subcutaneous TID WC  . insulin glargine  15 Units Subcutaneous QHS  . ipratropium  2 spray Each Nare BID  . liraglutide  1.8 mg Subcutaneous Q breakfast  . lisinopril  40 mg Oral Q supper  . LORazepam  2 mg Oral QHS  . multivitamin with minerals  1 tablet Oral Q breakfast  . omega-3 acid ethyl esters  2 capsule Oral BID  . polyethylene glycol  17 g Oral BID  . senna  1 tablet Oral BID  . sodium chloride flush  10-40 mL Intracatheter Q12H  . sodium chloride flush  3 mL Intravenous Q12H  . vitamin C  1,000 mg Oral Q breakfast  . vitamin E  400 Units Oral Q breakfast   Continuous Infusions: . sodium chloride    . ceFEPime (MAXIPIME) IV Stopped (09/11/16 1215)  . vancomycin      Principal Problem:  Rt Heel Osteomyelitis (HCC) Active Problems:   Atrial fibrillation (HCC)   Type 2 diabetes mellitus (HCC)   Cellulitis of heel, right   Essential hypertension   LOS: 3 days

## 2016-09-11 NOTE — Progress Notes (Signed)
Podiatry Progress Note  Subjective [name] [name] is POD 2 following debridement of his right foot with application of KCI Wound VAC.  Pain is currently controlled.  Wife at bedside.    Objective Vital signs in last 24 hours:   Temp:  [98 F (36.7 C)] 98 F (36.7 C) (06/08 0524) Pulse Rate:  [68-74] 70 (06/08 1300) Resp:  [16] 16 (06/08 1300) BP: (123-141)/(42-51) 123/51 (06/08 1300) SpO2:  [97 %] 97 % (06/08 1300)  KCI Wound VAC operational at 125 mmHg continuous pressure.  Moderate serosanguineous exudate.  VAC removed.  Wound granulating.  Periwound erythema improved.  Calves soft and nontender.  Lab/Test Results   Recent Labs  09/09/16 0514 09/11/16 0513  WBC 16.0*  --   HGB 12.8*  --   HCT 39.0  --   PLT 226  --   NA 135 137  K 3.8 3.8  CL 99* 101  CO2 28 29  BUN 32* 23*  CREATININE 0.84 0.71  GLUCOSE 243* 285*  CALCIUM 9.2 9.1    Recent Results (from the past 240 hour(s))  Wound or Superficial Culture     Status: None   Collection Time: 09/08/16  3:50 PM  Result Value Ref Range Status   Specimen Description FOOT RIGHT  Final   Special Requests NONE  Final   Gram Stain   Final    ABUNDANT WBC PRESENT, PREDOMINANTLY PMN ABUNDANT GRAM POSITIVE COCCI IN PAIRS IN CHAINS RARE GRAM POSITIVE RODS Performed at [facility], [place] [address] [place], [place] [address]    Culture ABUNDANT VIRIDANS STREPTOCOCCUS  Final   Report Status 09/11/2016 FINAL  Final   Organism ID, Bacteria VIRIDANS STREPTOCOCCUS  Final      Susceptibility   Viridans streptococcus - MIC*    PENICILLIN <=0.06 SENSITIVE Sensitive     CEFTRIAXONE 0.25 SENSITIVE Sensitive     ERYTHROMYCIN >=8 RESISTANT Resistant     LEVOFLOXACIN 0.5 SENSITIVE Sensitive     VANCOMYCIN 0.5 SENSITIVE Sensitive     * ABUNDANT VIRIDANS STREPTOCOCCUS  Culture, blood (Routine X 2) w Reflex to ID Panel     Status: None (Preliminary result)   Collection Time: 09/08/16 10:19 PM  Result Value Ref Range Status   Specimen Description BLOOD RIGHT HAND  Final   Special Requests Blood Culture adequate volume  Final   Culture NO GROWTH 3 DAYS  Final   Report Status PENDING  Incomplete  Culture, blood (Routine X 2) w Reflex to ID Panel     Status: None (Preliminary result)   Collection Time: 09/08/16 10:24 PM  Result Value Ref Range Status   Specimen Description LEFT ANTECUBITAL  Final   Special Requests Blood Culture adequate volume  Final   Culture NO GROWTH 3 DAYS  Final   Report Status PENDING  Incomplete  Aerobic/Anaerobic Culture (surgical/deep wound)     Status: None (Preliminary result)   Collection Time: 09/09/16 11:15 AM  Result Value Ref Range Status   Specimen Description ABSCESS  Final   Special Requests NONE  Final   Gram Stain   Final    ABUNDANT WBC PRESENT,BOTH PMN AND MONONUCLEAR ABUNDANT GRAM POSITIVE COCCI IN PAIRS    Culture   Final    ABUNDANT VIRIDANS STREPTOCOCCUS CULTURE REINCUBATED FOR BETTER GROWTH Performed at [facility], [address] [place], [place] [address]    Report Status PENDING  Incomplete     No results found.  Medications Scheduled Meds: .  amLODipine  5 mg Oral Q breakfast  . atorvastatin  10 mg Oral QPM  . calcium-vitamin D  2 tablet Oral Q breakfast  . cholecalciferol  1,000 Units Oral q morning - 10a  . digoxin  0.125 mg Oral Q breakfast  . insulin aspart  0-5 Units Subcutaneous QHS  . insulin aspart  0-9 Units Subcutaneous TID WC  . insulin aspart  5 Units Subcutaneous TID WC  . insulin glargine  15 Units Subcutaneous QHS  . ipratropium  2 spray Each Nare BID  . liraglutide  1.8 mg Subcutaneous Q breakfast  . lisinopril  40 mg Oral Q supper  . LORazepam  2 mg Oral QHS  . multivitamin with minerals  1 tablet Oral Q breakfast  . omega-3 acid ethyl esters  2 capsule Oral BID  . polyethylene glycol  17 g Oral BID  . senna  1 tablet Oral BID  . sodium chloride flush  10-40 mL Intracatheter Q12H  . sodium chloride flush  3 mL  Intravenous Q12H  . vitamin C  1,000 mg Oral Q breakfast  . vitamin E  400 Units Oral Q breakfast   Continuous Infusions: . sodium chloride    . cefTRIAXone (ROCEPHIN)  IV 2 g (09/11/16 1619)   PRN Meds:.sodium chloride, acetaminophen **OR** acetaminophen, albuterol, bisacodyl, HYDROcodone-acetaminophen, magnesium hydroxide, ondansetron **OR** ondansetron (ZOFRAN) IV, sodium chloride flush, sodium chloride flush, traZODone  Assessment 1.  Osteomyelitis of right calcaneus. 2.  Puncture wound, right foot. 3.  Diabetes mellitus.  Plan Discussed case with Dr. Sarajane Jews earlier.  IV antibiotics as planned.  Reapplied KCI Wound VAC personally.  Continue negative pressure therapy at 125 mmHg continuous pressure.  Spoke to Santiago Glad, Wound Care Nurse, at [facility].  She will change Wound VAC MWF.  I will be present for VAC change each Friday to monitor wound progress.  Remain non-weightbearing right lower extremity.  Okay for transfer to [facility] from my standpoint.  [name] 09/11/2016, 4:59 PM

## 2016-09-13 ENCOUNTER — Encounter (HOSPITAL_COMMUNITY)
Admission: RE | Admit: 2016-09-13 | Discharge: 2016-09-13 | Disposition: A | Discharge status: Home / Self Care | Payer: Medicare Other | Source: Skilled Nursing Facility | Attending: Internal Medicine | Admitting: Internal Medicine

## 2016-09-13 DIAGNOSIS — M8618 Other acute osteomyelitis, other site: Secondary | ICD-10-CM | POA: Insufficient documentation

## 2016-09-13 LAB — BASIC METABOLIC PANEL
Anion gap: 8 (ref 5–15)
BUN: 18 mg/dL (ref 6–20)
CO2: 29 mmol/L (ref 22–32)
Calcium: 9.2 mg/dL (ref 8.9–10.3)
Chloride: 99 mmol/L — ABNORMAL LOW (ref 101–111)
Creatinine, Ser: 0.75 mg/dL (ref 0.61–1.24)
GFR calc Af Amer: >60 mL/min (ref >60)
GLUCOSE: 234 mg/dL — AB (ref 65–99)
POTASSIUM: 3.6 mmol/L (ref 3.5–5.1)
Sodium: 136 mmol/L (ref 135–145)

## 2016-09-13 LAB — CBC WITH DIFFERENTIAL/PLATELET
BASOS ABS: 0.1 10*3/uL (ref 0.0–0.1)
Basophils Relative: 0 %
EOS PCT: 3 %
Eosinophils Absolute: 0.5 10*3/uL (ref 0.0–0.7)
HEMATOCRIT: 39 % (ref 39.0–52.0)
Hemoglobin: 13 g/dL (ref 13.0–17.0)
LYMPHS ABS: 8.8 10*3/uL — AB (ref 0.7–4.0)
Lymphocytes Relative: 58 %
MCH: 29.3 pg (ref 26.0–34.0)
MCHC: 33.3 g/dL (ref 30.0–36.0)
MCV: 87.8 fL (ref 78.0–100.0)
MONO ABS: 0.9 10*3/uL (ref 0.1–1.0)
Monocytes Relative: 6 %
Neutro Abs: 5 10*3/uL (ref 1.7–7.7)
Neutrophils Relative %: 33 %
Platelets: 188 10*3/uL (ref 150–400)
RBC: 4.44 MIL/uL (ref 4.22–5.81)
RDW: 14.5 % (ref 11.5–15.5)
WBC: 15.3 10*3/uL — ABNORMAL HIGH (ref 4.0–10.5)

## 2016-09-13 LAB — CULTURE, BLOOD (ROUTINE X 2)
CULTURE: NO GROWTH
Culture: NO GROWTH
SPECIAL REQUESTS: ADEQUATE
SPECIAL REQUESTS: ADEQUATE

## 2016-09-13 LAB — PROTIME-INR
INR: 1.26
Prothrombin Time: 15.9 seconds — ABNORMAL HIGH (ref 11.4–15.2)

## 2016-09-14 ENCOUNTER — Non-Acute Institutional Stay (SKILLED_NURSING_FACILITY): Payer: Medicare Other | Admitting: Internal Medicine

## 2016-09-14 ENCOUNTER — Encounter: Payer: Self-pay | Admitting: Internal Medicine

## 2016-09-14 ENCOUNTER — Other Ambulatory Visit: Payer: Self-pay | Admitting: *Deleted

## 2016-09-14 DIAGNOSIS — E118 Type 2 diabetes mellitus with unspecified complications: Secondary | ICD-10-CM | POA: Diagnosis not present

## 2016-09-14 DIAGNOSIS — M86171 Other acute osteomyelitis, right ankle and foot: Secondary | ICD-10-CM

## 2016-09-14 DIAGNOSIS — C9190 Lymphoid leukemia, unspecified not having achieved remission: Secondary | ICD-10-CM

## 2016-09-14 DIAGNOSIS — I4891 Unspecified atrial fibrillation: Secondary | ICD-10-CM

## 2016-09-14 DIAGNOSIS — I10 Essential (primary) hypertension: Secondary | ICD-10-CM

## 2016-09-14 DIAGNOSIS — C9110 Chronic lymphocytic leukemia of B-cell type not having achieved remission: Secondary | ICD-10-CM

## 2016-09-14 MED ORDER — HYDROCODONE-ACETAMINOPHEN 7.5-325 MG PO TABS: 1.0000 | ORAL_TABLET | ORAL | 0 refills | Status: DC | PRN

## 2016-09-14 MED ORDER — LORAZEPAM 2 MG PO TABS: ORAL_TABLET | ORAL | 0 refills | Status: DC

## 2016-09-14 NOTE — Progress Notes (Signed)
Provider:  Veleta Miners Location:   [place] Room Number: 159/P Place of Service:  SNF (31)  PCP: [name], MD Patient Care Team: [name], MD as PCP - General (Internal Medicine)  Extended Emergency Contact Information Primary Emergency Contact: [facility] Address: [address]          [place], [place] [address] Johnnette Litter of [place] Phone: [phone] Mobile Phone: [phone] Relation: Spouse  Code Status: Full Code Goals of Care: Advanced Directive information Advanced Directives 09/14/2016  Does Patient Have a Medical Advance Directive? Yes  Type of Advance Directive (No Data)  Does patient want to make changes to medical advance directive? No - Patient declined  Copy of [place] in Chart? -  Would patient like information on creating a medical advance directive? -  Pre-existing out of facility DNR order (yellow form or pink MOST form) -      Chief Complaint  Patient presents with  . New Admit To SNF    HPI: Patient is a 82 y.o. male seen today for admission to SNF for Therapy and IV antibiotics. Patient has h/o Diabetes type 2 with Neuropathy, Hypertension, Chronic Atrial fibrillation on Chronic Coumadin, hyperlipidemia, CLL, And UTI.  He sustained Right Heel Injury in 04/17 after he stepped on the tooth pick. The wound was debrided and started on Antibiotics. He continue to have worsening of his wound and developed some New spots and had MRI performed which showed Right Heel Osteomyelitis. Culture was done which showed Viridian Streptococcus. Patient Underwent Debridement of skin, subcutaneous tissue and fascia on 06/06 By  Dr Caprice Beaver. Wound VAC was placed. He also was started on IV Ceftriaxone. Per ID he will need 6 weeks of Antibiotics.  He is doing well in facility. His Pain is controlled on Norco. Denies any Fever or chills. Patient is was very independent  Before this incident. Lives with his wife. Has  family support and would be evenlly discharged home.  Past Medical History:  Diagnosis Date  . Atrial fibrillation ([place])   . Chronic lymphocytic leukemia ([place])   . Coronary atherosclerosis of native coronary artery    Nonobstructive  . Dysrhythmia   . Essential hypertension, benign   . Hyperlipidemia   . IgA nephropathy    Dr. Lowanda Foster  . Type 2 diabetes mellitus ([place])   . UTI (urinary tract infection) july  10 th   taking  med    Past Surgical History:  Procedure Laterality Date  . AMPUTATION Left 09/15/2012   Procedure: PARTIAL AMPUTATION 3rd TOE LEFT FOOT;  Surgeon: Marcheta Grammes, DPM;  Location: [facility];  Service: Orthopedics;  Laterality: Left;  . AMPUTATION Left 04/20/2013   Procedure: PARTIAL AMPUTATION 2ND TOE LEFT FOOT;  Surgeon: Marcheta Grammes, DPM;  Location: [facility];  Service: Orthopedics;  Laterality: Left;  . ANAL FISSURE REPAIR    . APPLICATION OF WOUND VAC Right 09/09/2016   Procedure: APPLICATION OF WOUND VAC;  Surgeon: Caprice Beaver, DPM;  Location: [facility];  Service: Podiatry;  Laterality: Right;  . CATARACT EXTRACTION W/PHACO Right 09/30/2015   Procedure: CATARACT EXTRACTION PHACO AND INTRAOCULAR LENS PLACEMENT (IOC);  Surgeon: Tonny Branch, MD;  Location: [facility];  Service: Ophthalmology;  Laterality: Right;  CDE: 11.78  . CATARACT EXTRACTION W/PHACO Left 10/31/2015   Procedure: CATARACT EXTRACTION PHACO AND INTRAOCULAR LENS PLACEMENT LEFT EYE; CDE:  9.10;  Surgeon: Tonny Branch, MD;  Location: [facility];  Service: Ophthalmology;  Laterality: Left;  .  COLONOSCOPY  08/19/2011   Procedure: COLONOSCOPY;  Surgeon: Rogene Houston, MD;  [facility];  Service: Endoscopy;  Laterality: N/A;  930  . COLONOSCOPY N/A 09/19/2014   Procedure: COLONOSCOPY;  Surgeon: Rogene Houston, MD;  [facility];  Service: Endoscopy;  Laterality: N/A;  1055  . FEMORAL HERNIA REPAIR    . INCISION AND DRAINAGE Right 09/09/2016   Procedure: DEBRIDEMENT WOUND RT  heel with wound vac attachment;  Surgeon: Caprice Beaver, DPM;  Location: [facility];  Service: Podiatry;  Laterality: Right;  . INCISION AND DRAINAGE OF WOUND Right 08/12/2016   Procedure: DEBRIDEMENT WOUND RIGHT HEEL;  Surgeon: Caprice Beaver, DPM;  Location: [facility];  Service: Podiatry;  Laterality: Right;  right heel  . Open repair left quadriceps tendon.  08/22/07   Dr. Aline Brochure  . Right total knee replacement    . TRANSURETHRAL RESECTION OF PROSTATE    . WOUND EXPLORATION Right 08/12/2016   Procedure: EXPLORATION OF WOUND FOR FOREIGN BODY RIGHT HEEL;  Surgeon: Caprice Beaver, DPM;  Location: [facility];  Service: Podiatry;  Laterality: Right;  right heel    reports that he quit smoking about 53 years ago. His smoking use included Cigarettes. He quit after 10.00 years of use. He has never used smokeless tobacco. He reports that he does not drink alcohol or use drugs. Social History   Social History  . Marital status: Married    Spouse name: N/A  . Number of children: N/A  . Years of education: N/A   Occupational History  . Retired Retired    Curator   Social History Main Topics  . Smoking status: Former Smoker    Years: 10.00    Types: Cigarettes    Quit date: 04/07/1963  . Smokeless tobacco: Never Used  . Alcohol use No  . Drug use: No  . Sexual activity: Not on file   Other Topics Concern  . Not on file   Social History Narrative  . No narrative on file    Functional Status Survey:    Family History  Problem Relation Age of Onset  . Atrial fibrillation Mother     Health Maintenance  Topic Date Due  . FOOT EXAM  10/14/1946  . OPHTHALMOLOGY EXAM  10/14/1946  . TETANUS/TDAP  10/14/1955  . PNA vac Low Risk Adult (1 of 2 - PCV13) 10/13/2001  . INFLUENZA VACCINE  11/04/2016  . HEMOGLOBIN A1C  12/23/2016    Allergies  Allergen Reactions  . Erythromycin Other (See Comments)  . Penicillins Hives and Other (See Comments)    Has patient had a PCN reaction causing  immediate rash, facial/tongue/throat swelling, SOB or lightheadedness with hypotension: No Has patient had a PCN reaction causing severe rash involving mucus membranes or skin necrosis: No Has patient had a PCN reaction that required hospitalization No Has patient had a PCN reaction occurring within the last 10 years: No If all of the above answers are "NO", then may proceed with Cephalosporin use.  Has tolerated Rocephin    Allergies as of 09/14/2016      Reactions   Erythromycin Other (See Comments)   Penicillins Hives, Other (See Comments)   Has patient had a PCN reaction causing immediate rash, facial/tongue/throat swelling, SOB or lightheadedness with hypotension: No Has patient had a PCN reaction causing severe rash involving mucus membranes or skin necrosis: No Has patient had a PCN reaction that required hospitalization No Has patient had a PCN reaction occurring within the  last 10 years: No If all of the above answers are "NO", then may proceed with Cephalosporin use. Has tolerated Rocephin      Medication List    Notice   This visit is during an admission. Changes to the med list made in this visit will be reflected in the After Visit Summary of the admission.     Review of Systems  Review of Systems  Constitutional: Negative for activity change, appetite change, chills, diaphoresis, fatigue and fever.  HENT: Negative for mouth sores, postnasal drip, rhinorrhea, sinus pain and sore throat.   Respiratory: Negative for apnea, cough, chest tightness, shortness of breath and wheezing.   Cardiovascular: Negative for chest pain, palpitations and leg swelling.  Gastrointestinal: Negative for abdominal distention, abdominal pain, constipation, diarrhea, nausea and vomiting.  Genitourinary: Negative for dysuria and frequency.  Musculoskeletal: Negative for arthralgias, joint swelling and myalgias.  Skin: Negative for rash.  Neurological: Negative for dizziness, syncope,  weakness, light-headedness and numbness.  Psychiatric/Behavioral: Negative for behavioral problems, confusion and sleep disturbance.     Vitals:   09/14/16 1356  BP: (!) 172/71  Pulse: 85  Resp: 20  Temp: 97.8 F (36.6 C)  TempSrc: Oral   There is no height or weight on file to calculate BMI. Physical Exam  Constitutional: He is oriented to person, place, and time. He appears well-developed and well-nourished.  HENT:  Head: Normocephalic.  Mouth/Throat: Oropharynx is clear and moist.  Eyes: Pupils are equal, round, and reactive to light.  Neck: Neck supple.  Cardiovascular: Normal rate.   Murmur heard. Pulmonary/Chest: Effort normal and breath sounds normal. No respiratory distress. He has no wheezes. He has no rales.  Abdominal: Soft. Bowel sounds are normal. He exhibits no distension. There is no tenderness. There is no rebound.  Musculoskeletal:  Has mild edema B/l His wound in the heel is clean with clean edges.  Lymphadenopathy:    He has no cervical adenopathy.  Neurological: He is alert and oriented to person, place, and time.  No Focal deficits  Skin: Skin is warm and dry. No rash noted. No erythema.  Psychiatric: He has a normal mood and affect. His behavior is normal. Judgment and thought content normal.    Labs reviewed: Basic Metabolic Panel:  Recent Labs  06/22/16 0443 06/23/16 0452  09/09/16 0514 09/11/16 0513 09/13/16 0615  NA 136 137  < > 135 137 136  K 3.1* 3.6  < > 3.8 3.8 3.6  CL 107 108  < > 99* 101 99*  CO2 22 22  < > 28 29 29   GLUCOSE 172* 178*  < > 243* 285* 234*  BUN 38* 30*  < > 32* 23* 18  CREATININE 1.03 0.80  < > 0.84 0.71 0.75  CALCIUM 8.6* 8.5*  < > 9.2 9.1 9.2  MG 1.4* 1.6*  --   --   --   --   < > = values in this interval not displayed. Liver Function Tests:  Recent Labs  06/20/16 2219 09/08/16 1545  AST 41 19  ALT 21 14*  ALKPHOS 122 61  BILITOT 2.3* 0.8  PROT 6.4* 6.3*  ALBUMIN 3.7 3.5   No results for input(s):  LIPASE, AMYLASE in the last 8760 hours. No results for input(s): AMMONIA in the last 8760 hours. CBC:  Recent Labs  08/11/16 1524 09/08/16 1545 09/09/16 0514 09/13/16 0615  WBC 16.2* 17.3* 16.0* 15.3*  NEUTROABS 4.2 5.7  --  5.0  HGB 13.6 12.9* 12.8*  13.0  HCT 41.1 39.4 39.0 39.0  MCV 90.1 88.5 89.0 87.8  PLT 190 226 226 188   Cardiac Enzymes: No results for input(s): CKTOTAL, CKMB, CKMBINDEX, TROPONINI in the last 8760 hours. BNP: Invalid input(s): POCBNP Lab Results  Component Value Date   HGBA1C 7.7 (H) 06/22/2016   No results found for: TSH No results found for: VITAMINB12 No results found for: FOLATE No results found for: IRON, TIBC, FERRITIN  Imaging and Procedures obtained prior to SNF admission: No results found.  Assessment/Plan  Right Heel Osteomyelitis S/P Debridement Patient on IV Ceftriaxone. Plan for 6 weeks of Antibiotics with follow up with Infectious disease for Further treatment.And Dr Caprice Beaver Wound Vac treat ment to be continued in the facility.  Type 2 diabetes mellitus  BS running more then 200 in facility. Will increase his Levimir to 20 units He was on 50 units at home and it was decreased to 15 in hospital. His Amaryl was also decreased to 2 mg in hospital.  Essential hypertension BP slightly high today but patient was in discomfort due to his wound Will continue to monitor. Conti ue Norvasc , HCTZ ans Lisinopril.  Atrial fibrillation,  Rate control on Digoxin Coumadin dose was adjusted in hospital will follow PT INR  Chronic lymphocytic leukemia  Baseline WBC is 15-16000     Family/ staff Communication:   Labs/tests ordered:  D/W Wife and son and wound care nurse. Total time spent in this patient care encounter was 45_ minutes; greater than 50% of the visit spent counseling patient and coordinating care for problems addressed at this encounter.

## 2016-09-14 NOTE — Telephone Encounter (Signed)
[facility] #[phone] Fax: [phone]   

## 2016-09-15 ENCOUNTER — Encounter (HOSPITAL_COMMUNITY)
Admission: RE | Admit: 2016-09-15 | Discharge: 2016-09-15 | Disposition: A | Discharge status: Home / Self Care | Payer: Medicare Other | Source: Skilled Nursing Facility | Attending: Internal Medicine | Admitting: Internal Medicine

## 2016-09-15 DIAGNOSIS — Z978 Presence of other specified devices: Secondary | ICD-10-CM | POA: Insufficient documentation

## 2016-09-15 DIAGNOSIS — Z7901 Long term (current) use of anticoagulants: Secondary | ICD-10-CM | POA: Insufficient documentation

## 2016-09-15 DIAGNOSIS — I482 Chronic atrial fibrillation: Secondary | ICD-10-CM | POA: Insufficient documentation

## 2016-09-15 DIAGNOSIS — M8618 Other acute osteomyelitis, other site: Secondary | ICD-10-CM | POA: Insufficient documentation

## 2016-09-15 DIAGNOSIS — B954 Other streptococcus as the cause of diseases classified elsewhere: Secondary | ICD-10-CM | POA: Insufficient documentation

## 2016-09-15 LAB — PROTIME-INR
INR: 1.29
Prothrombin Time: 16.2 seconds — ABNORMAL HIGH (ref 11.4–15.2)

## 2016-09-16 LAB — AEROBIC/ANAEROBIC CULTURE (SURGICAL/DEEP WOUND)

## 2016-09-16 LAB — AEROBIC/ANAEROBIC CULTURE W GRAM STAIN (SURGICAL/DEEP WOUND)

## 2016-09-17 ENCOUNTER — Encounter: Payer: Self-pay | Admitting: Internal Medicine

## 2016-09-17 ENCOUNTER — Encounter (HOSPITAL_COMMUNITY)
Admission: RE | Admit: 2016-09-17 | Discharge: 2016-09-17 | Disposition: A | Discharge status: Home / Self Care | Payer: Medicare Other | Source: Skilled Nursing Facility | Attending: Internal Medicine | Admitting: Internal Medicine

## 2016-09-17 ENCOUNTER — Non-Acute Institutional Stay (SKILLED_NURSING_FACILITY): Payer: Medicare Other | Admitting: Internal Medicine

## 2016-09-17 DIAGNOSIS — E118 Type 2 diabetes mellitus with unspecified complications: Secondary | ICD-10-CM

## 2016-09-17 DIAGNOSIS — C9190 Lymphoid leukemia, unspecified not having achieved remission: Secondary | ICD-10-CM

## 2016-09-17 DIAGNOSIS — R319 Hematuria, unspecified: Secondary | ICD-10-CM

## 2016-09-17 DIAGNOSIS — Z7901 Long term (current) use of anticoagulants: Secondary | ICD-10-CM | POA: Insufficient documentation

## 2016-09-17 DIAGNOSIS — B954 Other streptococcus as the cause of diseases classified elsewhere: Secondary | ICD-10-CM | POA: Insufficient documentation

## 2016-09-17 DIAGNOSIS — M8618 Other acute osteomyelitis, other site: Secondary | ICD-10-CM | POA: Insufficient documentation

## 2016-09-17 DIAGNOSIS — C9110 Chronic lymphocytic leukemia of B-cell type not having achieved remission: Secondary | ICD-10-CM

## 2016-09-17 DIAGNOSIS — Z978 Presence of other specified devices: Secondary | ICD-10-CM | POA: Insufficient documentation

## 2016-09-17 LAB — URINALYSIS, ROUTINE W REFLEX MICROSCOPIC
BILIRUBIN URINE: NEGATIVE
Bacteria, UA: NONE SEEN
GLUCOSE, UA: NEGATIVE mg/dL
Ketones, ur: NEGATIVE mg/dL
LEUKOCYTES UA: NEGATIVE
NITRITE: NEGATIVE
PROTEIN: NEGATIVE mg/dL
Specific Gravity, Urine: 1.008 (ref 1.005–1.030)
pH: 5 (ref 5.0–8.0)

## 2016-09-17 LAB — BASIC METABOLIC PANEL
ANION GAP: 8 (ref 5–15)
BUN: 33 mg/dL — AB (ref 6–20)
CHLORIDE: 98 mmol/L — AB (ref 101–111)
CO2: 29 mmol/L (ref 22–32)
Calcium: 9.5 mg/dL (ref 8.9–10.3)
Creatinine, Ser: 0.89 mg/dL (ref 0.61–1.24)
Glucose, Bld: 200 mg/dL — ABNORMAL HIGH (ref 65–99)
POTASSIUM: 3.7 mmol/L (ref 3.5–5.1)
SODIUM: 135 mmol/L (ref 135–145)

## 2016-09-17 LAB — CBC
HCT: 39.1 % (ref 39.0–52.0)
HEMOGLOBIN: 12.8 g/dL — AB (ref 13.0–17.0)
MCH: 28.9 pg (ref 26.0–34.0)
MCHC: 32.7 g/dL (ref 30.0–36.0)
MCV: 88.3 fL (ref 78.0–100.0)
PLATELETS: 182 10*3/uL (ref 150–400)
RBC: 4.43 MIL/uL (ref 4.22–5.81)
RDW: 14.9 % (ref 11.5–15.5)
WBC: 17.9 10*3/uL — AB (ref 4.0–10.5)

## 2016-09-17 NOTE — Progress Notes (Signed)
Location:   [facility] Room Number: 159/P Place of Service:  SNF [phone]) Provider:  Ezekiel Ina, MD  Patient Care Team: [name], MD as PCP - General (Internal Medicine)  Extended Emergency Contact Information Primary Emergency Contact: [facility] Address: [address]          [place], [place] [address] Johnnette Litter of [place] Phone: [phone] Mobile Phone: [phone] Relation: Spouse  Code Status:  FULL Code Goals of care: Advanced Directive information Advanced Directives 09/17/2016  Does Patient Have a Medical Advance Directive? Yes  Type of Advance Directive (No Data)  Does patient want to make changes to medical advance directive? No - Patient declined  Copy of [place] in Chart? -  Would patient like information on creating a medical advance directive? -  Pre-existing out of facility DNR order (yellow form or pink MOST form) -     Chief Complaint  Patient presents with  . Acute Visit    Hyperglycemia  Also complains of hematuria  HPI:  Pt is a 82 y.o. male seen today for an acute visit for follow-up of elevated blood sugars-also apparently an episode of hematuria earlier today.  Patient has h/o Diabetes type 2 with Neuropathy, Hypertension, Chronic Atrial fibrillation on Chronic Coumadin, hyperlipidemia, CLL, And UTI.  He sustained Right Heel Injury in 04/17 after he stepped on the tooth pick. The wound was debrided and started on Antibiotics. He continued to have worsening of his wound and developed some New spots and had MRI performed which showed Right Heel Osteomyelitis. Culture was done which showed Viridian Streptococcus. Patient Underwent Debridement of skin, subcutaneous tissue and fascia on 06/06 By  Dr Caprice Beaver. Wound VAC was placed. He also was started on IV Ceftriaxone. Per ID he will need 6 weeks of Antibiotics.  He is doing well in facility. His Pain is controlled on Norco. Denies any Fever  or chills.  Regards to diabetes with his on numerous agents including Actos 30 mg a day-Amaryl 2 mg a day Levemir originally came in on 15 units a has been as high as 50 units a day at home-he is also on Victoza.  Victoza  was held in the hospital by restarted on discharge-and as noted above was discharged on 15units of Levemir with recommendation to monitor his blood sugars closely.  Sugars initially were quite elevated running in the 2-300 range --I see one at 409 on Tuesday the 12th-on the 13th Levemir was increased up to 20 units and this appears to have had a beneficial effect although we have minimal readings blood sugar yesterday215-2 68-this morning was 274  His wife also noticed some hematuria today-she does have some history of a UTI with sepsis in the past he is not complaining of any back pain fever or chills     Past Medical History:  Diagnosis Date  . Atrial fibrillation ([place])   . Chronic lymphocytic leukemia ([place])   . Coronary atherosclerosis of native coronary artery    Nonobstructive  . Dysrhythmia   . Essential hypertension, benign   . Hyperlipidemia   . IgA nephropathy    Dr. Lowanda Foster  . Type 2 diabetes mellitus ([place])   . UTI (urinary tract infection) july  10 th   taking  med    Past Surgical History:  Procedure Laterality Date  . AMPUTATION Left 09/15/2012   Procedure: PARTIAL AMPUTATION 3rd TOE LEFT FOOT;  Surgeon: Marcheta Grammes, DPM;  Location: [facility];  Service: Orthopedics;  Laterality: Left;  . AMPUTATION Left 04/20/2013   Procedure: PARTIAL AMPUTATION 2ND TOE LEFT FOOT;  Surgeon: Marcheta Grammes, DPM;  Location: [facility] ORS;  Service: Orthopedics;  Laterality: Left;  . ANAL FISSURE REPAIR    . APPLICATION OF WOUND VAC Right 09/09/2016   Procedure: APPLICATION OF WOUND VAC;  Surgeon: Caprice Beaver, DPM;  Location: [facility] ORS;  Service: Podiatry;  Laterality: Right;  . CATARACT EXTRACTION W/PHACO Right 09/30/2015   Procedure: CATARACT EXTRACTION  PHACO AND INTRAOCULAR LENS PLACEMENT (IOC);  Surgeon: Tonny Branch, MD;  Location: [facility];  Service: Ophthalmology;  Laterality: Right;  CDE: 11.78  . CATARACT EXTRACTION W/PHACO Left 10/31/2015   Procedure: CATARACT EXTRACTION PHACO AND INTRAOCULAR LENS PLACEMENT LEFT EYE; CDE:  9.10;  Surgeon: Tonny Branch, MD;  Location: [facility];  Service: Ophthalmology;  Laterality: Left;  . COLONOSCOPY  08/19/2011   Procedure: COLONOSCOPY;  Surgeon: Rogene Houston, MD;  Location: [facility];  Service: Endoscopy;  Laterality: N/A;  930  . COLONOSCOPY N/A 09/19/2014   Procedure: COLONOSCOPY;  Surgeon: Rogene Houston, MD;  [facility];  Service: Endoscopy;  Laterality: N/A;  1055  . FEMORAL HERNIA REPAIR    . INCISION AND DRAINAGE Right 09/09/2016   Procedure: DEBRIDEMENT WOUND RT heel with wound vac attachment;  Surgeon: Caprice Beaver, DPM;  Location: [facility];  Service: Podiatry;  Laterality: Right;  . INCISION AND DRAINAGE OF WOUND Right 08/12/2016   Procedure: DEBRIDEMENT WOUND RIGHT HEEL;  Surgeon: Caprice Beaver, DPM;  Location: [facility];  Service: Podiatry;  Laterality: Right;  right heel  . Open repair left quadriceps tendon.  08/22/07   Dr. Aline Brochure  . Right total knee replacement    . TRANSURETHRAL RESECTION OF PROSTATE    . WOUND EXPLORATION Right 08/12/2016   Procedure: EXPLORATION OF WOUND FOR FOREIGN BODY RIGHT HEEL;  Surgeon: Caprice Beaver, DPM;  Location: [facility];  Service: Podiatry;  Laterality: Right;  right heel    Allergies  Allergen Reactions  . Erythromycin Other (See Comments)  . Penicillins Hives and Other (See Comments)    Has patient had a PCN reaction causing immediate rash, facial/tongue/throat swelling, SOB or lightheadedness with hypotension: No Has patient had a PCN reaction causing severe rash involving mucus membranes or skin necrosis: No Has patient had a PCN reaction that required hospitalization No Has patient had a PCN reaction occurring within the last 10  years: No If all of the above answers are "NO", then may proceed with Cephalosporin use.  Has tolerated Rocephin    Outpatient Encounter Prescriptions as of 09/17/2016  Medication Sig  . acetaminophen (TYLENOL) 650 MG CR tablet Take 650 mg by mouth every 8 (eight) hours as needed for pain.  Marland Kitchen amLODipine (NORVASC) 5 MG tablet Take 5 mg by mouth daily with breakfast.  . Ascorbic Acid (VITAMIN C) 1000 MG tablet Take 1,000 mg by mouth daily with breakfast.   . atorvastatin (LIPITOR) 10 MG tablet Take 10 mg by mouth every evening.  . Calcium Carb-Cholecalciferol (CALCIUM 600+D) 600-800 MG-UNIT TABS Take 2 tablets by mouth daily with breakfast.  . cefTRIAXone 2 g in dextrose 5 % 50 mL Inject 2 g into the vein daily. Last dose 7/17.  . cholecalciferol (VITAMIN D) 1000 units tablet Take 1,000 Units by mouth every morning.  . Coenzyme Q10 (CO Q-10) 100 MG CAPS Take 100 mg by mouth daily with breakfast.   . digoxin (LANOXIN) 0.125 MG tablet Take  0.125 mg by mouth daily with breakfast.   . Fish Oil-Cholecalciferol (FISH OIL + D3) 1200-1000 MG-UNIT CAPS Take 2 capsules by mouth 2 (two) times daily.  Marland Kitchen glimepiride (AMARYL) 2 MG tablet Take 2 mg by mouth daily with breakfast.   . hydrochlorothiazide 25 MG tablet Take 25 mg by mouth daily with breakfast.   . HYDROcodone-acetaminophen (NORCO/VICODIN) 5-325 MG tablet Give 1 tablet by mouth every 4 hours for pain level of 0-5. Give 2 tablets by mouth for pain level of 6-10.  Marland Kitchen insulin aspart (NOVOLOG FLEXPEN) 100 UNIT/ML FlexPen Pt uses per sliding scale before lunch and dinner.  . Insulin Detemir (LEVEMIR FLEXPEN) 100 UNIT/ML Pen Inject 20 Units into the skin every morning.  Marland Kitchen ipratropium (ATROVENT) 0.06 % nasal spray Place 2 sprays into both nostrils 2 (two) times daily.  Marland Kitchen lisinopril (PRINIVIL,ZESTRIL) 20 MG tablet Take 40 mg by mouth daily with supper.  Marland Kitchen LORazepam (ATIVAN) 2 MG tablet Take one tablet by mouth at bedtime for nervousness and inability to  rest.  . Multiple Vitamin (MULTIVITAMIN WITH MINERALS) TABS tablet Take 1 tablet by mouth daily with breakfast.  . pioglitazone (ACTOS) 30 MG tablet Take 30 mg by mouth every evening.   Marland Kitchen VICTOZA 18 MG/3ML SOPN Inject 1.8 mg into the skin daily with breakfast.  . vitamin E 400 UNIT capsule Take 400 Units by mouth daily with breakfast.   . warfarin (COUMADIN) 7.5 MG tablet Take 7.5 mg by mouth every evening.  . [DISCONTINUED] HYDROcodone-acetaminophen (NORCO) 7.5-325 MG tablet Take 1 tablet by mouth every 4 (four) hours as needed for moderate pain. DNE 3gm of APAP/24hrs from all sources (Patient taking differently: Take 1-2 tablets by mouth every 4 (four) hours as needed for moderate pain. DNE 3gm of APAP/24hrs from all sources)  . [DISCONTINUED] LEVEMIR FLEXTOUCH 100 UNIT/ML Pen Inject 15 Units into the skin daily with breakfast. (Patient taking differently: Inject 20 Units into the skin daily with breakfast. )  . [DISCONTINUED] warfarin (COUMADIN) 6 MG tablet Take 6 mg by mouth daily.   No facility-administered encounter medications on file as of 09/17/2016.     Review of Systems In general is not complaining of any fever or chills.  Skin does not complain of rashes or itching does have heel wound currently with wound VAC applied.  Head ears eyes nose mouth and throat does not complain of any sore throat or visual changes.  Respiratory denies any shortness of breath or cough.  Cardiac denies chest pain.  GU is complaining of some burning with urination intermittent also apparently has had an episode of hematuria.  Musculoskeletal at this point does not really complain of joint pain or for pain.  Neurologic is not complaining dizziness headache or syncope.  Psyche- does not complain of overt anxiety or depression  There is no immunization history on file for this patient. Pertinent  Health Maintenance Due  Topic Date Due  . FOOT EXAM  10/14/1944  . OPHTHALMOLOGY EXAM  10/14/1944    . URINE MICROALBUMIN  10/14/1944  . PNA vac Low Risk Adult (1 of 2 - PCV13) 10/13/2001  . INFLUENZA VACCINE  11/04/2016  . HEMOGLOBIN A1C  12/23/2016   No flowsheet data found. Functional Status Survey:   He is afebrile pulse of 80 respirations 18 blood pressure 138/86 weight is 273.5   Physical Exam Constitutional: He is oriented to person, place, and time. He appears well-developed and well-nourished lying comfortably in bed initially was sleeping but was easily  aroused.  HENT:  Head: Normocephalic.  Mouth/Throat: Oropharynx is clear and moist.  Eyes: Pupils are equal, round, and reactive to light.  Neck: Neck supple.  Cardiovascular: Normal rate.   Murmur heard. Pulmonary/Chest: Effort normal and breath sounds normal. No respiratory distress. He has no wheezes. He has no rales.  Abdominal: Soft. Bowel sounds are normal. He exhibits no distension. There is no tenderness. There is no rebound GU-could not appreciate any active bleeding from the penis-did not really note any suprapubic distention possibly some small amount of suprapubic tenderness.  Musculoskeletal:  Has mild edema B/l His wound in the heel-- Lymphadenopathy:    wound VAC is currently applied  .  Neurological: He is alert and oriented to person, place, and time.  No Focal deficits  Skin: Skin is warm and dry. No rash noted. No erythema.  Psychiatric: He has a normal mood and affect. His behavior is normal. Judgment and thoughtThere is abs reviewed:  Recent Labs  06/22/16 0443 06/23/16 0452  09/11/16 0513 09/13/16 0615 09/17/16 0720  NA 136 137  < > 137 136 135  K 3.1* 3.6  < > 3.8 3.6 3.7  CL 107 108  < > 101 99* 98*  CO2 22 22  < > 29 29 29   GLUCOSE 172* 178*  < > 285* 234* 200*  BUN 38* 30*  < > 23* 18 33*  CREATININE 1.03 0.80  < > 0.71 0.75 0.89  CALCIUM 8.6* 8.5*  < > 9.1 9.2 9.5  MG 1.4* 1.6*  --   --   --   --   < > = values in this interval not displayed.  Recent Labs  06/20/16 2219  09/08/16 1545  AST 41 19  ALT 21 14*  ALKPHOS 122 61  BILITOT 2.3* 0.8  PROT 6.4* 6.3*  ALBUMIN 3.7 3.5    Recent Labs  08/11/16 1524 09/08/16 1545 09/09/16 0514 09/13/16 0615 09/17/16 0720  WBC 16.2* 17.3* 16.0* 15.3* 17.9*  NEUTROABS 4.2 5.7  --  5.0  --   HGB 13.6 12.9* 12.8* 13.0 12.8*  HCT 41.1 39.4 39.0 39.0 39.1  MCV 90.1 88.5 89.0 87.8 88.3  PLT 190 226 226 188 182   No results found for: TSH Lab Results  Component Value Date   HGBA1C 7.7 (H) 06/22/2016   No results found for: CHOL, HDL, LDLCALC, LDLDIRECT, TRIG, CHOLHDL  Significant Diagnostic Results in last 30 days:  Mr Foot Right Wo Contrast  Result Date: 09/08/2016 CLINICAL DATA:  Right heel pain, nonhealing ulcer to the heel. EXAM: MRI OF THE RIGHT FOREFOOT WITHOUT CONTRAST TECHNIQUE: Multiplanar, multisequence MR imaging of the right hindfoot was performed. No intravenous contrast was administered. COMPARISON:  08/03/2016 MRI, 09/08/2016 radiographs FINDINGS: Once again, no T1 weighted images performed secondary to patient discomfort. Patient refused additional imaging. Proton density and T2 fat saturated axial images in the sagittal inversion recovery sequence were able to be portably. Bones/Joint/Cartilage The posterolateral corner of the calcaneus demonstrates subtle subcortical marrow edema and an indistinct appearance of the cortical margin, series 4, image 20 and series 5, image 22. This is deep to an area of known ulceration and findings are concerning for changes of early osteomyelitis given technical limitations of an incomplete study and lack of IV contrast. Ligaments Intact lateral and medial ligamentous complexes crossing the ankle joint. Muscles and Tendons The flexor and extensor tendons crossing the ankle joint are of normal signal intensity morphology. Achilles tendon appears intact. Soft tissues Heel  ulcer measuring approximately 3.5 x 1.7 x 2 cm. IMPRESSION: Soft tissue ulceration of the heel  estimated at 3.5 x 1.7 x 2 cm with underlying cortical involvement of the posterolateral corner of the calcaneus and with subcortical marrow edema noted. Findings are suspicious for developing osteomyelitis. Electronically Signed   By: Ashley Royalty M.D.   On: 09/08/2016 18:17   US Arterial Seg Multiple  Result Date: 09/09/2016 CLINICAL DATA:  Nonhealing open wound of heel EXAM: NONINVASIVE PHYSIOLOGIC VASCULAR STUDY OF BILATERAL LOWER EXTREMITIES TECHNIQUE: Evaluation of both lower extremities was performed at rest, including calculation of ankle-brachial indices, multiple segmental pressure evaluation, segmental Doppler and segmental pulse volume recording. COMPARISON:  08/12/2012 FINDINGS: Right ABI:  Non calculable due to vascular noncompressibility Left ABI:  Non calculable due to vascular noncompressibility Right Lower Extremity: Biphasic arterial waveforms are noted distally. Multiple levels of vascular noncompressibility . Left Lower Extremity: Biphasic arterial waveforms distally. Multiple levels of vascular noncompressibility . IMPRESSION: Segmental noninvasive evaluation is limited due to extensive vascular noncompressibility presumably secondary to medial calcification. However, the normal distal arterial waveforms suggest no high-grade lower extremity arterial occlusive disease at rest. If there is continued high clinical concern of hemodynamically significant lower extremity arterial occlusive disease, recommend MRA runoff for better evaluation. Electronically Signed   By: Lucrezia Europe M.D.   On: 09/09/2016 16:57   Dg Foot Complete Right  Result Date: 09/08/2016 CLINICAL DATA:  Right heel ulcer worsening. EXAM: RIGHT FOOT COMPLETE - 3+ VIEW COMPARISON:  None. FINDINGS: Mild degenerative change over the first MTP joint and interphalangeal joints. No evidence of acute fracture or dislocation. No evidence of bone destruction to suggest osteomyelitis. No significant air in the soft tissues. Mild soft  tissue irregularity over the posterior soft tissues of the ankle/heel compatible with known ulceration. Minimal spurring over the inferior posterior calcaneus. Small vessel atherosclerotic disease. IMPRESSION: Soft tissue changes compatible with known heel ulcer. No underlying bony abnormality. Mild degenerative changes as described. Electronically Signed   By: Marin Olp M.D.   On: 09/08/2016 17:13    Assessment/Plan  #1 hyperglycemia --diabetes 2--old and leg Levemir has just been increased up to 20 units a day blood sugars appear to be moderating but will need more results --at this point continue Levemir 20 units daily-continue previous medications including Actos-Amaryl-and Victoza which a\ was added upon discharge from hospital   #2 hematuria at this point will order a UA C&S he does have a history of significant infections in the past at this point will monitor and await urine culture results.  #3 leukocytosis on lab today of 17,900 this appears to be relatively be within his chronic range he does have a history of CLL does not show signs of active acute infection.  [phone] and

## 2016-09-18 ENCOUNTER — Encounter (HOSPITAL_COMMUNITY)
Admission: RE | Admit: 2016-09-18 | Discharge: 2016-09-18 | Disposition: A | Discharge status: Home / Self Care | Payer: Medicare Other | Source: Skilled Nursing Facility | Attending: Internal Medicine | Admitting: Internal Medicine

## 2016-09-18 DIAGNOSIS — M8618 Other acute osteomyelitis, other site: Secondary | ICD-10-CM | POA: Insufficient documentation

## 2016-09-18 DIAGNOSIS — Z978 Presence of other specified devices: Secondary | ICD-10-CM | POA: Insufficient documentation

## 2016-09-18 DIAGNOSIS — B954 Other streptococcus as the cause of diseases classified elsewhere: Secondary | ICD-10-CM | POA: Insufficient documentation

## 2016-09-18 DIAGNOSIS — Z7901 Long term (current) use of anticoagulants: Secondary | ICD-10-CM | POA: Insufficient documentation

## 2016-09-18 DIAGNOSIS — I482 Chronic atrial fibrillation: Secondary | ICD-10-CM | POA: Insufficient documentation

## 2016-09-18 LAB — PROTIME-INR
INR: 1.79
PROTHROMBIN TIME: 21.1 s — AB (ref 11.4–15.2)

## 2016-09-19 ENCOUNTER — Encounter (HOSPITAL_COMMUNITY)
Admission: RE | Admit: 2016-09-19 | Discharge: 2016-09-19 | Disposition: A | Discharge status: Home / Self Care | Payer: Medicare Other

## 2016-09-19 LAB — URINE CULTURE: Culture: NO GROWTH

## 2016-09-19 LAB — PROTIME-INR
INR: 1.84
PROTHROMBIN TIME: 21.5 s — AB (ref 11.4–15.2)

## 2016-09-21 ENCOUNTER — Encounter (HOSPITAL_COMMUNITY)
Admission: RE | Admit: 2016-09-21 | Discharge: 2016-09-21 | Disposition: A | Discharge status: Home / Self Care | Payer: Medicare Other | Source: Skilled Nursing Facility | Attending: *Deleted | Admitting: *Deleted

## 2016-09-21 LAB — PROTIME-INR
INR: 2
PROTHROMBIN TIME: 23 s — AB (ref 11.4–15.2)

## 2016-09-24 ENCOUNTER — Other Ambulatory Visit (HOSPITAL_COMMUNITY)
Admission: RE | Admit: 2016-09-24 | Discharge: 2016-09-24 | Disposition: A | Discharge status: Home / Self Care | Payer: Medicare Other | Source: Skilled Nursing Facility | Attending: Internal Medicine | Admitting: Internal Medicine

## 2016-09-24 DIAGNOSIS — I482 Chronic atrial fibrillation: Secondary | ICD-10-CM | POA: Insufficient documentation

## 2016-09-24 LAB — PROTIME-INR
INR: 2.37
PROTHROMBIN TIME: 26.3 s — AB (ref 11.4–15.2)

## 2016-09-28 ENCOUNTER — Other Ambulatory Visit (HOSPITAL_COMMUNITY)
Admission: RE | Admit: 2016-09-28 | Discharge: 2016-09-28 | Disposition: A | Discharge status: Home / Self Care | Payer: Medicare Other | Source: Skilled Nursing Facility | Attending: Internal Medicine | Admitting: Internal Medicine

## 2016-09-28 DIAGNOSIS — I482 Chronic atrial fibrillation: Secondary | ICD-10-CM | POA: Insufficient documentation

## 2016-09-28 LAB — PROTIME-INR
INR: 2.7
Prothrombin Time: 29.2 seconds — ABNORMAL HIGH (ref 11.4–15.2)

## 2016-10-05 ENCOUNTER — Other Ambulatory Visit (HOSPITAL_COMMUNITY)
Admission: RE | Admit: 2016-10-05 | Discharge: 2016-10-05 | Disposition: A | Discharge status: Home / Self Care | Payer: Medicare Other | Source: Other Acute Inpatient Hospital | Attending: Internal Medicine | Admitting: Internal Medicine

## 2016-10-05 ENCOUNTER — Non-Acute Institutional Stay (SKILLED_NURSING_FACILITY): Payer: Medicare Other | Admitting: Internal Medicine

## 2016-10-05 ENCOUNTER — Encounter: Payer: Self-pay | Admitting: Internal Medicine

## 2016-10-05 DIAGNOSIS — E782 Mixed hyperlipidemia: Secondary | ICD-10-CM | POA: Diagnosis not present

## 2016-10-05 DIAGNOSIS — I4891 Unspecified atrial fibrillation: Secondary | ICD-10-CM

## 2016-10-05 DIAGNOSIS — C9110 Chronic lymphocytic leukemia of B-cell type not having achieved remission: Secondary | ICD-10-CM

## 2016-10-05 DIAGNOSIS — M86171 Other acute osteomyelitis, right ankle and foot: Secondary | ICD-10-CM | POA: Diagnosis not present

## 2016-10-05 DIAGNOSIS — I10 Essential (primary) hypertension: Secondary | ICD-10-CM

## 2016-10-05 DIAGNOSIS — C9190 Lymphoid leukemia, unspecified not having achieved remission: Secondary | ICD-10-CM

## 2016-10-05 DIAGNOSIS — E118 Type 2 diabetes mellitus with unspecified complications: Secondary | ICD-10-CM | POA: Diagnosis not present

## 2016-10-05 DIAGNOSIS — I482 Chronic atrial fibrillation: Secondary | ICD-10-CM | POA: Insufficient documentation

## 2016-10-05 LAB — PROTIME-INR
INR: 3.12
Prothrombin Time: 32.8 seconds — ABNORMAL HIGH (ref 11.4–15.2)

## 2016-10-05 NOTE — Progress Notes (Signed)
Location:   [facility] Room Number: 159/P Place of Service:  SNF (31) Provider:  Ezekiel Ina, MD  Patient Care Team: [name], MD as PCP - General (Internal Medicine)  Extended Emergency Contact Information Primary Emergency Contact: [facility] Address: [place], [place] [address] [address] of [place] Phone: [phone] Mobile Phone: [phone] Relation: Spouse  Code Status:  Full Code Goals of care: Advanced Directive information Advanced Directives 10/05/2016  Does Patient Have a Medical Advance Directive? Yes  Type of Advance Directive (No Data)  Does patient want to make changes to medical advance directive? No - Patient declined  Copy of [place] in Chart? -  Would patient like information on creating a medical advance directive? -  Pre-existing out of facility DNR order (yellow form or pink MOST form) -     Chief Complaint  Patient presents with  . Medical Management of Chronic Issues    Routine Visit  Medical management of right heel osteomyelitis-type 2 diabetes-hypertension-atrial fibrillation-chronic lymphocytic leukemia-also acute visit secondary to anticoagulation management  HPI:  Pt is a 82 y.o. male seen today for medical management of chronic diseases. As noted above-he is here for long-term antibiotics at 6 weeks of IV Rocephin with a history of right heel osteomyelitis-he had had a wound VAC but actually this is doing well and wound VAC was discontinued as of today.  He does have a history of atrial fibrillation and is currently on Coumadin alternating doses of 7 and 7-1/2 mg a day-INR is somewhat therapeutic today at 3.12 he continues on digoxin as well.  there's been no increased bruising or bleeding.-  His other medical issues appear to be relatively stable he does have a history of type 2 diabetes he is on Levemir 20 units a day also on Amaryl 2 mg a day and Actos 30 mg  a dayalso on victoza -he has variable blood sugars appear to be largely in the mid 100s in the a.m. some more variability later in the day ranging from the mid 100s up to mid 200s-hemoglobin A1c was 7.7 back in March we will update this.  He also has a history of hypertension is on Norvasc 5 mg a day hydrochlorothiazide 25 mg a day and lisinopril 40 mg a day this appears quite stable systolics ranging from 756 it is 142 today but does not appear to be consistently elevated -- previous readings 126/71-118/60 and 123/84  \He does have a history of CLL baseline white count appears to be around 15-17,000 we will update this he has been followed by oncology.         Past Medical History:  Diagnosis Date  . Atrial fibrillation ([place])   . Chronic lymphocytic leukemia ([place])   . Coronary atherosclerosis of native coronary artery    Nonobstructive  . Dysrhythmia   . Essential hypertension, benign   . Hyperlipidemia   . IgA nephropathy    Dr. Lowanda Foster  . Type 2 diabetes mellitus ([place])   . UTI (urinary tract infection) july  10 th   taking  med    Past Surgical History:  Procedure Laterality Date  . AMPUTATION Left 09/15/2012   Procedure: PARTIAL AMPUTATION 3rd TOE LEFT FOOT;  Surgeon: Marcheta Grammes, DPM;  Location: [facility];  Service: Orthopedics;  Laterality: Left;  . AMPUTATION Left 04/20/2013   Procedure: PARTIAL AMPUTATION 2ND TOE LEFT FOOT;  Surgeon:  Marcheta Grammes, DPM;  Location: [facility];  Service: Orthopedics;  Laterality: Left;  . ANAL FISSURE REPAIR    . APPLICATION OF WOUND VAC Right 09/09/2016   Procedure: APPLICATION OF WOUND VAC;  Surgeon: Caprice Beaver, DPM;  Location: [facility];  Service: Podiatry;  Laterality: Right;  . CATARACT EXTRACTION W/PHACO Right 09/30/2015   Procedure: CATARACT EXTRACTION PHACO AND INTRAOCULAR LENS PLACEMENT (IOC);  Surgeon: Tonny Branch, MD;  Location: [facility];  Service: Ophthalmology;  Laterality: Right;  CDE: 11.78  . CATARACT EXTRACTION  W/PHACO Left 10/31/2015   Procedure: CATARACT EXTRACTION PHACO AND INTRAOCULAR LENS PLACEMENT LEFT EYE; CDE:  9.10;  Surgeon: Tonny Branch, MD;  Location: [facility];  Service: Ophthalmology;  Laterality: Left;  . COLONOSCOPY  08/19/2011   Procedure: COLONOSCOPY;  Surgeon: Rogene Houston, MD;  Location: [facility];  Service: Endoscopy;  Laterality: N/A;  930  . COLONOSCOPY N/A 09/19/2014   Procedure: COLONOSCOPY;  Surgeon: Rogene Houston, MD;  [facility];  Service: Endoscopy;  Laterality: N/A;  1055  . FEMORAL HERNIA REPAIR    . INCISION AND DRAINAGE Right 09/09/2016   Procedure: DEBRIDEMENT WOUND RT heel with wound vac attachment;  Surgeon: Caprice Beaver, DPM;  Location: [facility];  Service: Podiatry;  Laterality: Right;  . INCISION AND DRAINAGE OF WOUND Right 08/12/2016   Procedure: DEBRIDEMENT WOUND RIGHT HEEL;  Surgeon: Caprice Beaver, DPM;  Location: [facility];  Service: Podiatry;  Laterality: Right;  right heel  . Open repair left quadriceps tendon.  08/22/07   Dr. Aline Brochure  . Right total knee replacement    . TRANSURETHRAL RESECTION OF PROSTATE    . WOUND EXPLORATION Right 08/12/2016   Procedure: EXPLORATION OF WOUND FOR FOREIGN BODY RIGHT HEEL;  Surgeon: Caprice Beaver, DPM;  Location: [facility];  Service: Podiatry;  Laterality: Right;  right heel    Allergies  Allergen Reactions  . Erythromycin Other (See Comments)  . Penicillins Hives and Other (See Comments)    Has patient had a PCN reaction causing immediate rash, facial/tongue/throat swelling, SOB or lightheadedness with hypotension: No Has patient had a PCN reaction causing severe rash involving mucus membranes or skin necrosis: No Has patient had a PCN reaction that required hospitalization No Has patient had a PCN reaction occurring within the last 10 years: No If all of the above answers are "NO", then may proceed with Cephalosporin use.  Has tolerated Rocephin    Outpatient Encounter Prescriptions as of  10/05/2016  Medication Sig  . acetaminophen (TYLENOL) 650 MG CR tablet Take 650 mg by mouth every 8 (eight) hours as needed for pain.  Marland Kitchen amLODipine (NORVASC) 5 MG tablet Take 5 mg by mouth daily with breakfast.  . Ascorbic Acid (VITAMIN C) 1000 MG tablet Take 1,000 mg by mouth daily with breakfast.   . atorvastatin (LIPITOR) 10 MG tablet Take 10 mg by mouth every evening.  . Calcium Carb-Cholecalciferol (CALCIUM 600+D) 600-800 MG-UNIT TABS Take 2 tablets by mouth daily with breakfast.  . cefTRIAXone 2 g in dextrose 5 % 50 mL Inject 2 g into the vein daily. Last dose 7/17.  . cholecalciferol (VITAMIN D) 1000 units tablet Take 1,000 Units by mouth every morning.  . Coenzyme Q10 (CO Q-10) 100 MG CAPS Take 100 mg by mouth daily with breakfast.   . digoxin (LANOXIN) 0.125 MG tablet Take 0.125 mg by mouth daily with breakfast.   . Fish Oil-Cholecalciferol (FISH OIL + D3) 1200-1000 MG-UNIT CAPS Take 2 capsules by  mouth 2 (two) times daily.  Marland Kitchen glimepiride (AMARYL) 2 MG tablet Take 2 mg by mouth daily with breakfast.   . hydrochlorothiazide 25 MG tablet Take 25 mg by mouth daily with breakfast.   . HYDROcodone-acetaminophen (NORCO/VICODIN) 5-325 MG tablet Give 1 tablet by mouth every 4 hours for pain level of 0-5. Give 2 tablets by mouth for pain level of 6-10.  Marland Kitchen insulin aspart (NOVOLOG FLEXPEN) 100 UNIT/ML FlexPen Pt uses per sliding scale before lunch and dinner.  . Insulin Detemir (LEVEMIR FLEXPEN) 100 UNIT/ML Pen Inject 20 Units into the skin every morning.  Marland Kitchen ipratropium (ATROVENT) 0.06 % nasal spray Place 2 sprays into both nostrils 2 (two) times daily.  Marland Kitchen lisinopril (PRINIVIL,ZESTRIL) 20 MG tablet Take 40 mg by mouth daily with supper.  Marland Kitchen LORazepam (ATIVAN) 2 MG tablet Take one tablet by mouth at bedtime for nervousness and inability to rest.  . Multiple Vitamin (MULTIVITAMIN WITH MINERALS) TABS tablet Take 1 tablet by mouth daily with breakfast.  . pioglitazone (ACTOS) 30 MG tablet Take 30 mg by  mouth every evening.   Marland Kitchen VICTOZA 18 MG/3ML SOPN Inject 1.8 mg into the skin daily with breakfast.  . vitamin E 400 UNIT capsule Take 400 Units by mouth daily with breakfast.   . warfarin (COUMADIN) 1 MG tablet Take 1 mg by mouth. Give along with 6 mg by mouth to= 7 mg on Sun, Tue, Thu, Sat  . warfarin (COUMADIN) 6 MG tablet Take 6 mg by mouth. Give long with 1 mg by mouth to = 7 mg on Sun, Tue, Thu, Sat  . warfarin (COUMADIN) 7.5 MG tablet Take 7.5 mg by mouth every evening. Mon, Wed, Fri.   No facility-administered encounter medications on file as of 10/05/2016.     Review of Systems   In general is not complaining of any fever or chills. Weight appears to be stable  Skin does not complain of rashes or itching does have heel wound that apparently has initially improved her nursing and wound VAC was removed today  Head ears eyes nose mouth and throat does not complain of any sore throat or visual changes.  Respiratory denies any shortness of breath or cough.  Cardiac denies chest pain.  GU -denies dysuria when I saw him a couple weeks ago he did complain of some slight hematuria but this has resolved and urine culture did not reveal any significant infection  Musculoskeletal at this point does not really complain of joint pain  pain. Or for pain  Neurologic is not complaining dizziness headache or syncope.  Psyche- does not complain of overt anxiety or depression   There is no immunization history on file for this patient. Pertinent  Health Maintenance Due  Topic Date Due  . FOOT EXAM  10/14/1944  . OPHTHALMOLOGY EXAM  10/14/1944  . URINE MICROALBUMIN  10/14/1944  . PNA vac Low Risk Adult (1 of 2 - PCV13) 10/13/2001  . INFLUENZA VACCINE  11/04/2016  . HEMOGLOBIN A1C  12/23/2016   No flowsheet data found. Functional Status Survey:    Vitals:   10/05/16 1446  BP: (!) 142/78  Pulse: 93  Resp: 18  Temp: 97.5 F (36.4 C)  TempSrc: Oral   Weight is 276 pounds  which appears to be relatively stable    Physical Exam   In general this is a pleasant elderly male in no distress sitting comfortably in his wheelchair he is bright and alert.  Skin is warm and dry there is  wrapping of his right heel does have venous stasis bilaterally.  Eyes sclera and conjunctiva are clear visual acuity appears grossly intact.  Oropharynx mucous membranes moist.  Heart is regular rate and rhythm with a slight systolic murmur-he has mild lower extremity edema-venous stasis changes.  Chest clear to auscultation with shallow air entry there is no labored breathing  Abdomen is soft nontender with positive bowel sounds it is somewhat obese.  Abdomen he does have an umbilical hernia iabdomen is soft nontender with positive bowel sound  Musculoskeletal--strength appears preserved he does have dressing over his right ankle venous stasis changes of his lower extremities bilaterally. Has lower extremity weakness baseline upper extremity strength appears preserved  Neurologic is grossly intact to speech is clear no lateralizing findings.  Psych he is alert and oriented pleasant and appropriate  Labs reviewed:  Recent Labs  06/22/16 0443 06/23/16 0452  09/11/16 0513 09/13/16 0615 09/17/16 0720  NA 136 137  < > 137 136 135  K 3.1* 3.6  < > 3.8 3.6 3.7  CL 107 108  < > 101 99* 98*  CO2 22 22  < > 29 29 29   GLUCOSE 172* 178*  < > 285* 234* 200*  BUN 38* 30*  < > 23* 18 33*  CREATININE 1.03 0.80  < > 0.71 0.75 0.89  CALCIUM 8.6* 8.5*  < > 9.1 9.2 9.5  MG 1.4* 1.6*  --   --   --   --   < > = values in this interval not displayed.  Recent Labs  06/20/16 2219 09/08/16 1545  AST 41 19  ALT 21 14*  ALKPHOS 122 61  BILITOT 2.3* 0.8  PROT 6.4* 6.3*  ALBUMIN 3.7 3.5    Recent Labs  08/11/16 1524 09/08/16 1545 09/09/16 0514 09/13/16 0615 09/17/16 0720  WBC 16.2* 17.3* 16.0* 15.3* 17.9*  NEUTROABS 4.2 5.7  --  5.0  --   HGB 13.6 12.9* 12.8* 13.0 12.8*    HCT 41.1 39.4 39.0 39.0 39.1  MCV 90.1 88.5 89.0 87.8 88.3  PLT 190 226 226 188 182   No results found for: TSH Lab Results  Component Value Date   HGBA1C 7.7 (H) 06/22/2016   No results found for: CHOL, HDL, LDLCALC, LDLDIRECT, TRIG, CHOLHDL  Significant Diagnostic Results in last 30 days:  Mr Foot Right Wo Contrast  Result Date: 09/08/2016 CLINICAL DATA:  Right heel pain, nonhealing ulcer to the heel. EXAM: MRI OF THE RIGHT FOREFOOT WITHOUT CONTRAST TECHNIQUE: Multiplanar, multisequence MR imaging of the right hindfoot was performed. No intravenous contrast was administered. COMPARISON:  08/03/2016 MRI, 09/08/2016 radiographs FINDINGS: Once again, no T1 weighted images performed secondary to patient discomfort. Patient refused additional imaging. Proton density and T2 fat saturated axial images in the sagittal inversion recovery sequence were able to be portably. Bones/Joint/Cartilage The posterolateral corner of the calcaneus demonstrates subtle subcortical marrow edema and an indistinct appearance of the cortical margin, series 4, image 20 and series 5, image 22. This is deep to an area of known ulceration and findings are concerning for changes of early osteomyelitis given technical limitations of an incomplete study and lack of IV contrast. Ligaments Intact lateral and medial ligamentous complexes crossing the ankle joint. Muscles and Tendons The flexor and extensor tendons crossing the ankle joint are of normal signal intensity morphology. Achilles tendon appears intact. Soft tissues Heel ulcer measuring approximately 3.5 x 1.7 x 2 cm. IMPRESSION: Soft tissue ulceration of the heel estimated at 3.5  x 1.7 x 2 cm with underlying cortical involvement of the posterolateral corner of the calcaneus and with subcortical marrow edema noted. Findings are suspicious for developing osteomyelitis. Electronically Signed   By: Ashley Royalty M.D.   On: 09/08/2016 18:17   US Arterial Seg Multiple  Result  Date: 09/09/2016 CLINICAL DATA:  Nonhealing open wound of heel EXAM: NONINVASIVE PHYSIOLOGIC VASCULAR STUDY OF BILATERAL LOWER EXTREMITIES TECHNIQUE: Evaluation of both lower extremities was performed at rest, including calculation of ankle-brachial indices, multiple segmental pressure evaluation, segmental Doppler and segmental pulse volume recording. COMPARISON:  08/12/2012 FINDINGS: Right ABI:  Non calculable due to vascular noncompressibility Left ABI:  Non calculable due to vascular noncompressibility Right Lower Extremity: Biphasic arterial waveforms are noted distally. Multiple levels of vascular noncompressibility . Left Lower Extremity: Biphasic arterial waveforms distally. Multiple levels of vascular noncompressibility . IMPRESSION: Segmental noninvasive evaluation is limited due to extensive vascular noncompressibility presumably secondary to medial calcification. However, the normal distal arterial waveforms suggest no high-grade lower extremity arterial occlusive disease at rest. If there is continued high clinical concern of hemodynamically significant lower extremity arterial occlusive disease, recommend MRA runoff for better evaluation. Electronically Signed   By: Lucrezia Europe M.D.   On: 09/09/2016 16:57   Dg Foot Complete Right  Result Date: 09/08/2016 CLINICAL DATA:  Right heel ulcer worsening. EXAM: RIGHT FOOT COMPLETE - 3+ VIEW COMPARISON:  None. FINDINGS: Mild degenerative change over the first MTP joint and interphalangeal joints. No evidence of acute fracture or dislocation. No evidence of bone destruction to suggest osteomyelitis. No significant air in the soft tissues. Mild soft tissue irregularity over the posterior soft tissues of the ankle/heel compatible with known ulceration. Minimal spurring over the inferior posterior calcaneus. Small vessel atherosclerotic disease. IMPRESSION: Soft tissue changes compatible with known heel ulcer. No underlying bony abnormality. Mild degenerative  changes as described. Electronically Signed   By: Marin Olp M.D.   On: 09/08/2016 17:13    Assessment/Plan  #1-right heel osteomyelitis fairly doing very well heel wound VAC is been discontinued he continues on IV ceftriaxone for 6 total weeks-he is followed by care here well.--For pain management continues on Norco when necessary this appears to be effective when needed  #2 diabetes type 2 this appears relatively stable does have some elevations later in the day will update a hemoglobin A1c before making changesshe is on Levemir Amaryl and Actos.  As well as Victoza  #3 hypertension this appears relatively well controlled as noted above he is on Norvasc hydrochlorothiazide as well as lisinopril will update a metabolic panel.  #4-history of atrial fibrillation this appears rate controlled he is on Coumadin and INR is slightly therapeutic will reduce Coumadin to 5 mg and recheck an INR tomorrow.  Also will update a digoxin level.  #5-history of CLL-this is been followed by oncology will update a CBC again baseline white count appears to be around 17,000.  #6 history of hyperlipidemia he is on a statin as well as fish oil will update a lipid panel as well.  #7 anxiety this appears stable on when necessary Ativan.  #8-vitamin D deficiency I do not see recent vitamin D level will update he is on supplementation.  #9 dysuria this appears resolved again we did obtain a urine culture with did not show any significant growth-he appears to be stronger feeling better than when I last saw him 2 weeks ago--according to his wife he does have a history of UTIs with sepsis but this appears  to be stabilized . -[id number] note greater than 35 minutes spent assessing patient-reviewing his chart-reviewing his labs-and coordinating and formulating a plan of care for numerous diagnoses-of note greater than 50% of time spent coordinating plan of care [phone]

## 2016-10-06 ENCOUNTER — Other Ambulatory Visit (HOSPITAL_COMMUNITY)
Admission: RE | Admit: 2016-10-06 | Discharge: 2016-10-06 | Disposition: A | Discharge status: Home / Self Care | Payer: Medicare Other | Source: Skilled Nursing Facility | Attending: Internal Medicine | Admitting: Internal Medicine

## 2016-10-06 DIAGNOSIS — I482 Chronic atrial fibrillation: Secondary | ICD-10-CM | POA: Insufficient documentation

## 2016-10-06 LAB — BASIC METABOLIC PANEL
Anion gap: 8 (ref 5–15)
BUN: 34 mg/dL — AB (ref 6–20)
CALCIUM: 9.6 mg/dL (ref 8.9–10.3)
CHLORIDE: 100 mmol/L — AB (ref 101–111)
CO2: 29 mmol/L (ref 22–32)
CREATININE: 1.01 mg/dL (ref 0.61–1.24)
GFR calc non Af Amer: >60 mL/min (ref >60)
Glucose, Bld: 197 mg/dL — ABNORMAL HIGH (ref 65–99)
Potassium: 3.5 mmol/L (ref 3.5–5.1)
Sodium: 137 mmol/L (ref 135–145)

## 2016-10-06 LAB — CBC WITH DIFFERENTIAL/PLATELET
BASOS PCT: 0 %
Basophils Absolute: 0 10*3/uL (ref 0.0–0.1)
EOS PCT: 4 %
Eosinophils Absolute: 0.5 10*3/uL (ref 0.0–0.7)
HCT: 38.4 % — ABNORMAL LOW (ref 39.0–52.0)
HEMOGLOBIN: 12.7 g/dL — AB (ref 13.0–17.0)
Lymphocytes Relative: 63 %
Lymphs Abs: 7.5 10*3/uL — ABNORMAL HIGH (ref 0.7–4.0)
MCH: 29.4 pg (ref 26.0–34.0)
MCHC: 33.1 g/dL (ref 30.0–36.0)
MCV: 88.9 fL (ref 78.0–100.0)
MONO ABS: 1 10*3/uL (ref 0.1–1.0)
Monocytes Relative: 8 %
NEUTROS ABS: 3 10*3/uL (ref 1.7–7.7)
NEUTROS PCT: 25 %
PLATELETS: 142 10*3/uL — AB (ref 150–400)
RBC: 4.32 MIL/uL (ref 4.22–5.81)
RDW: 15.4 % (ref 11.5–15.5)
WBC: 12 10*3/uL — ABNORMAL HIGH (ref 4.0–10.5)

## 2016-10-06 LAB — LIPID PANEL
CHOL/HDL RATIO: 5.4 ratio
CHOLESTEROL: 124 mg/dL (ref 0–200)
HDL: 23 mg/dL — ABNORMAL LOW (ref >40)
LDL CALC: 71 mg/dL (ref 0–99)
TRIGLYCERIDES: 150 mg/dL — AB (ref <150)
VLDL: 30 mg/dL (ref 0–40)

## 2016-10-06 LAB — PROTIME-INR
INR: 2.77
Prothrombin Time: 29.8 seconds — ABNORMAL HIGH (ref 11.4–15.2)

## 2016-10-06 LAB — DIGOXIN LEVEL: Digoxin Level: 0.4 ng/mL — ABNORMAL LOW (ref 0.8–2.0)

## 2016-10-07 LAB — HEMOGLOBIN A1C
Hgb A1c MFr Bld: 7.9 % — ABNORMAL HIGH (ref 4.8–5.6)
MEAN PLASMA GLUCOSE: 180 mg/dL

## 2016-10-08 ENCOUNTER — Encounter (HOSPITAL_COMMUNITY)
Admission: RE | Admit: 2016-10-08 | Discharge: 2016-10-08 | Disposition: A | Discharge status: Home / Self Care | Payer: Medicare Other | Source: Skilled Nursing Facility | Attending: *Deleted | Admitting: *Deleted

## 2016-10-08 DIAGNOSIS — M8618 Other acute osteomyelitis, other site: Secondary | ICD-10-CM | POA: Insufficient documentation

## 2016-10-08 LAB — PROTIME-INR
INR: 2.93
Prothrombin Time: 31.2 seconds — ABNORMAL HIGH (ref 11.4–15.2)

## 2016-10-09 ENCOUNTER — Encounter (HOSPITAL_COMMUNITY)
Admission: RE | Admit: 2016-10-09 | Discharge: 2016-10-09 | Disposition: A | Discharge status: Home / Self Care | Payer: Medicare Other | Source: Skilled Nursing Facility | Attending: *Deleted | Admitting: *Deleted

## 2016-10-09 LAB — PROTIME-INR
INR: 3.4
Prothrombin Time: 35.1 seconds — ABNORMAL HIGH (ref 11.4–15.2)

## 2016-10-10 ENCOUNTER — Encounter (HOSPITAL_COMMUNITY)
Admission: RE | Admit: 2016-10-10 | Discharge: 2016-10-10 | Disposition: A | Discharge status: Home / Self Care | Payer: Medicare Other | Source: Skilled Nursing Facility | Attending: *Deleted | Admitting: *Deleted

## 2016-10-10 LAB — PROTIME-INR
INR: 2.56
Prothrombin Time: 28 seconds — ABNORMAL HIGH (ref 11.4–15.2)

## 2016-10-12 ENCOUNTER — Encounter (HOSPITAL_COMMUNITY)
Admission: RE | Admit: 2016-10-12 | Discharge: 2016-10-12 | Disposition: A | Discharge status: Home / Self Care | Payer: Medicare Other | Source: Skilled Nursing Facility | Attending: *Deleted | Admitting: *Deleted

## 2016-10-12 LAB — CBC
HCT: 42.2 % (ref 39.0–52.0)
Hemoglobin: 13.8 g/dL (ref 13.0–17.0)
MCH: 29.3 pg (ref 26.0–34.0)
MCHC: 32.7 g/dL (ref 30.0–36.0)
MCV: 89.6 fL (ref 78.0–100.0)
Platelets: 169 10*3/uL (ref 150–400)
RBC: 4.71 MIL/uL (ref 4.22–5.81)
RDW: 15.3 % (ref 11.5–15.5)
WBC: 14.8 10*3/uL — AB (ref 4.0–10.5)

## 2016-10-12 LAB — PROTIME-INR
INR: 1.88
Prothrombin Time: 21.9 seconds — ABNORMAL HIGH (ref 11.4–15.2)

## 2016-10-15 ENCOUNTER — Encounter (HOSPITAL_COMMUNITY)
Admission: RE | Admit: 2016-10-15 | Discharge: 2016-10-15 | Disposition: A | Discharge status: Home / Self Care | Payer: Medicare Other | Source: Skilled Nursing Facility | Attending: *Deleted | Admitting: *Deleted

## 2016-10-15 LAB — PROTIME-INR
INR: 1.87
PROTHROMBIN TIME: 21.8 s — AB (ref 11.4–15.2)

## 2016-10-19 ENCOUNTER — Encounter (HOSPITAL_COMMUNITY)
Admission: RE | Admit: 2016-10-19 | Discharge: 2016-10-19 | Disposition: A | Discharge status: Home / Self Care | Payer: Medicare Other | Source: Skilled Nursing Facility | Attending: *Deleted | Admitting: *Deleted

## 2016-10-19 LAB — PROTIME-INR
INR: 1.81
PROTHROMBIN TIME: 21.2 s — AB (ref 11.4–15.2)

## 2016-10-26 ENCOUNTER — Non-Acute Institutional Stay (SKILLED_NURSING_FACILITY): Payer: Medicare Other | Admitting: Internal Medicine

## 2016-10-26 ENCOUNTER — Encounter: Payer: Self-pay | Admitting: Internal Medicine

## 2016-10-26 ENCOUNTER — Inpatient Hospital Stay: Payer: Medicare Other | Admitting: Infectious Diseases

## 2016-10-26 ENCOUNTER — Encounter (HOSPITAL_COMMUNITY)
Admission: RE | Admit: 2016-10-26 | Discharge: 2016-10-26 | Disposition: A | Discharge status: Home / Self Care | Payer: Medicare Other | Source: Skilled Nursing Facility | Attending: Pediatrics | Admitting: Pediatrics

## 2016-10-26 DIAGNOSIS — M86171 Other acute osteomyelitis, right ankle and foot: Secondary | ICD-10-CM

## 2016-10-26 DIAGNOSIS — I10 Essential (primary) hypertension: Secondary | ICD-10-CM

## 2016-10-26 DIAGNOSIS — C9190 Lymphoid leukemia, unspecified not having achieved remission: Secondary | ICD-10-CM | POA: Diagnosis not present

## 2016-10-26 DIAGNOSIS — I4891 Unspecified atrial fibrillation: Secondary | ICD-10-CM | POA: Diagnosis not present

## 2016-10-26 DIAGNOSIS — E118 Type 2 diabetes mellitus with unspecified complications: Secondary | ICD-10-CM | POA: Diagnosis not present

## 2016-10-26 DIAGNOSIS — C9110 Chronic lymphocytic leukemia of B-cell type not having achieved remission: Secondary | ICD-10-CM

## 2016-10-26 DIAGNOSIS — E782 Mixed hyperlipidemia: Secondary | ICD-10-CM | POA: Diagnosis not present

## 2016-10-26 LAB — PROTIME-INR
INR: 2.58
Prothrombin Time: 28.2 seconds — ABNORMAL HIGH (ref 11.4–15.2)

## 2016-10-26 NOTE — Progress Notes (Signed)
Location:   [facility] Room Number: 159/P Place of Service:  SNF (31)  Provider: Granville Lewis  PCP: [name], MD Patient Care Team: [name], MD as PCP - General (Internal Medicine)  Extended Emergency Contact Information Primary Emergency Contact: Fleites,Norma Address: [address]          [place], [place] [address] Johnnette Litter of [place] Phone: [phone] Mobile Phone: [phone] Relation: Spouse  Code Status: Full Code Goals of care:  Advanced Directive information Advanced Directives 10/26/2016  Does Patient Have a Medical Advance Directive? Yes  Type of Advance Directive (No Data)  Does patient want to make changes to medical advance directive? No - Patient declined  Copy of [place] in Chart? -  Would patient like information on creating a medical advance directive? -  Pre-existing out of facility DNR order (yellow form or pink MOST form) -     Allergies  Allergen Reactions  . Erythromycin Other (See Comments)  . Penicillins Hives and Other (See Comments)    Has patient had a PCN reaction causing immediate rash, facial/tongue/throat swelling, SOB or lightheadedness with hypotension: No Has patient had a PCN reaction causing severe rash involving mucus membranes or skin necrosis: No Has patient had a PCN reaction that required hospitalization No Has patient had a PCN reaction occurring within the last 10 years: No If all of the above answers are "NO", then may proceed with Cephalosporin use.  Has tolerated Rocephin    Chief Complaint  Patient presents with  . Discharge Note    HPI:  82 y.o. male  seen today for discharge from facility tomorrow.  He has a history of right heel osteomyelitis-diabetes type 2-hypertension-atrial fibrillation-CLL-.  He was here for long-term antibiotics for 6 weeks of IV Rocephin with a history of the right heel osteomyelitis which apparently has improved significantly heel wound  VAC but this was discontinued-the wound is now rarely minimal compared to what it was.  He will need follow-up by home health wound care by home health.  He also has a history of atrial fibrillation is on Coumadin and INR is therapeutic today at 2.5 a this will need to be rechecked by home health later this week and primary care provider notified for follow-up.  His other medical issues appear to be  Relatively  stable-he has a history of type 2 diabetes on Levemir 20 units a day as well as Amaryl 2 mg a day and Actos 30 mg a day he's also on Victoza--a.m. blood sugars appear to be in the mid 100s to lower 200s this appears to be the case as well at night-at this point since he is about to go home would be hesitantto increase his medication but this will need follow-up by primary care provider  Hemoglobin A1c was 7.9 earlier this month.  He has a history of hypertension this appears stable recent blood pressures 135/76-136/88-he is on hydrochlorothiazide 25 mg a day-Norvasc 5 mg a day-lisinopril 40 mg a day.  He also has a history of CLL with chronically elevated white count which appears relatively stable at 14,800 on lab earlier this month.  Currently has no complaints appears to be in good spirits which is his normal presentation-he will see his physician in regards to the foot wound on Friday-    Past Medical History:  Diagnosis Date  . Atrial fibrillation ([place])   . Chronic lymphocytic leukemia ([place])   . Coronary atherosclerosis of native coronary artery  Nonobstructive  . Dysrhythmia   . Essential hypertension, benign   . Hyperlipidemia   . IgA nephropathy    Dr. Lowanda Foster  . Type 2 diabetes mellitus ([place])   . UTI (urinary tract infection) july  10 th   taking  med     Past Surgical History:  Procedure Laterality Date  . AMPUTATION Left 09/15/2012   Procedure: PARTIAL AMPUTATION 3rd TOE LEFT FOOT;  Surgeon: Marcheta Grammes, DPM;  Location: [facility];  Service:  Orthopedics;  Laterality: Left;  . AMPUTATION Left 04/20/2013   Procedure: PARTIAL AMPUTATION 2ND TOE LEFT FOOT;  Surgeon: Marcheta Grammes, DPM;  Location: [facility];  Service: Orthopedics;  Laterality: Left;  . ANAL FISSURE REPAIR    . APPLICATION OF WOUND VAC Right 09/09/2016   Procedure: APPLICATION OF WOUND VAC;  Surgeon: Caprice Beaver, DPM;  Location: [facility];  Service: Podiatry;  Laterality: Right;  . CATARACT EXTRACTION W/PHACO Right 09/30/2015   Procedure: CATARACT EXTRACTION PHACO AND INTRAOCULAR LENS PLACEMENT (IOC);  Surgeon: Tonny Branch, MD;  Location: [facility];  Service: Ophthalmology;  Laterality: Right;  CDE: 11.78  . CATARACT EXTRACTION W/PHACO Left 10/31/2015   Procedure: CATARACT EXTRACTION PHACO AND INTRAOCULAR LENS PLACEMENT LEFT EYE; CDE:  9.10;  Surgeon: Tonny Branch, MD;  Location: [facility];  Service: Ophthalmology;  Laterality: Left;  . COLONOSCOPY  08/19/2011   Procedure: COLONOSCOPY;  Surgeon: Rogene Houston, MD;  Location[facility];  Service: Endoscopy;  Laterality: N/A;  930  . COLONOSCOPY N/A 09/19/2014   Procedure: COLONOSCOPY;  Surgeon: Rogene Houston, MD;  [facility];  Service: Endoscopy;  Laterality: N/A;  1055  . FEMORAL HERNIA REPAIR    . INCISION AND DRAINAGE Right 09/09/2016   Procedure: DEBRIDEMENT WOUND RT heel with wound vac attachment;  Surgeon: Caprice Beaver, DPM;  Location: [facility];  Service: Podiatry;  Laterality: Right;  . INCISION AND DRAINAGE OF WOUND Right 08/12/2016   Procedure: DEBRIDEMENT WOUND RIGHT HEEL;  Surgeon: Caprice Beaver, DPM;  Location: [facility];  Service: Podiatry;  Laterality: Right;  right heel  . Open repair left quadriceps tendon.  08/22/07   Dr. Aline Brochure  . Right total knee replacement    . TRANSURETHRAL RESECTION OF PROSTATE    . WOUND EXPLORATION Right 08/12/2016   Procedure: EXPLORATION OF WOUND FOR FOREIGN BODY RIGHT HEEL;  Surgeon: Caprice Beaver, DPM;  Location: [facility];  Service: Podiatry;  Laterality:  Right;  right heel      reports that he quit smoking about 53 years ago. His smoking use included Cigarettes. He quit after 10.00 years of use. He has never used smokeless tobacco. He reports that he does not drink alcohol or use drugs. Social History   Social History  . Marital status: Married    Spouse name: N/A  . Number of children: N/A  . Years of education: N/A   Occupational History  . Retired Retired    Curator   Social History Main Topics  . Smoking status: Former Smoker    Years: 10.00    Types: Cigarettes    Quit date: 04/07/1963  . Smokeless tobacco: Never Used  . Alcohol use No  . Drug use: No  . Sexual activity: Not on file   Other Topics Concern  . Not on file   Social History Narrative  . No narrative on file   Functional Status Survey:    Allergies  Allergen Reactions  . Erythromycin Other (See Comments)  .  Penicillins Hives and Other (See Comments)    Has patient had a PCN reaction causing immediate rash, facial/tongue/throat swelling, SOB or lightheadedness with hypotension: No Has patient had a PCN reaction causing severe rash involving mucus membranes or skin necrosis: No Has patient had a PCN reaction that required hospitalization No Has patient had a PCN reaction occurring within the last 10 years: No If all of the above answers are "NO", then may proceed with Cephalosporin use.  Has tolerated Rocephin    Pertinent  Health Maintenance Due  Topic Date Due  . FOOT EXAM  10/14/1944  . OPHTHALMOLOGY EXAM  10/14/1944  . URINE MICROALBUMIN  10/14/1944  . PNA vac Low Risk Adult (1 of 2 - PCV13) 03/06/2017 (Originally 10/13/2001)  . INFLUENZA VACCINE  11/04/2016  . HEMOGLOBIN A1C  04/08/2017    Medications: Outpatient Encounter Prescriptions as of 10/26/2016  Medication Sig  . acetaminophen (TYLENOL) 650 MG CR tablet Take 650 mg by mouth every 8 (eight) hours as needed for pain.  Marland Kitchen amLODipine (NORVASC) 5 MG tablet Take 5 mg by mouth daily with  breakfast.  . Ascorbic Acid (VITAMIN C) 1000 MG tablet Take 1,000 mg by mouth daily with breakfast.   . atorvastatin (LIPITOR) 10 MG tablet Take 10 mg by mouth every evening.  . Calcium Carb-Cholecalciferol (CALCIUM 600+D) 600-800 MG-UNIT TABS Take 2 tablets by mouth daily with breakfast.  . cholecalciferol (VITAMIN D) 1000 units tablet Take 1,000 Units by mouth every morning.  . Coenzyme Q10 (CO Q-10) 100 MG CAPS Take 100 mg by mouth daily with breakfast.   . digoxin (LANOXIN) 0.125 MG tablet Take 0.125 mg by mouth daily with breakfast.   . Fish Oil-Cholecalciferol (FISH OIL + D3) 1200-1000 MG-UNIT CAPS Take 2 capsules by mouth 2 (two) times daily.  Marland Kitchen glimepiride (AMARYL) 2 MG tablet Take 2 mg by mouth daily with breakfast.   . hydrochlorothiazide 25 MG tablet Take 25 mg by mouth daily with breakfast.   . insulin aspart (NOVOLOG FLEXPEN) 100 UNIT/ML FlexPen Pt uses per sliding scale before lunch and dinner.  . Insulin Detemir (LEVEMIR FLEXPEN) 100 UNIT/ML Pen Inject 20 Units into the skin every morning.  Marland Kitchen ipratropium (ATROVENT) 0.06 % nasal spray Place 2 sprays into both nostrils 2 (two) times daily.  Marland Kitchen lisinopril (PRINIVIL,ZESTRIL) 20 MG tablet Take 40 mg by mouth daily with supper.  Marland Kitchen LORazepam (ATIVAN) 2 MG tablet Take one tablet by mouth at bedtime for nervousness and inability to rest.  . Multiple Vitamin (MULTIVITAMIN WITH MINERALS) TABS tablet Take 1 tablet by mouth daily with breakfast.  . pioglitazone (ACTOS) 30 MG tablet Take 30 mg by mouth every evening.   Marland Kitchen VICTOZA 18 MG/3ML SOPN Inject 1.8 mg into the skin daily with breakfast.  . vitamin E 400 UNIT capsule Take 400 Units by mouth daily with breakfast.   . warfarin (COUMADIN) 1 MG tablet Take 0.5 mg by mouth. Give along with 6 mg by mouth to= 6.5 mg on Sun, Tue, Thu, Sat  . warfarin (COUMADIN) 6 MG tablet Take 6 mg by mouth. Give long with 0.5 mg by mouth to = 6.5 mg on Sun, Tue, Thu, Sat  . [DISCONTINUED] cefTRIAXone 2 g in  dextrose 5 % 50 mL Inject 2 g into the vein daily. Last dose 7/17.  . [DISCONTINUED] HYDROcodone-acetaminophen (NORCO/VICODIN) 5-325 MG tablet Give 1 tablet by mouth every 4 hours for pain level of 0-5. Give 2 tablets by mouth for pain level of 6-10.  . [  DISCONTINUED] ipratropium (ATROVENT) 0.06 % nasal spray Place 2 sprays into both nostrils 2 (two) times daily.  . [DISCONTINUED] warfarin (COUMADIN) 7.5 MG tablet Take 7.5 mg by mouth every evening. Mon, Wed, Fri.   No facility-administered encounter medications on file as of 10/26/2016.     Review of Systems   In general is not complaining of any fever or chills. Weight appears to be stable  Skin does not complain of rashes or itching does have heel wound that apparently has considerably improved y  Head ears eyes nose mouth and throat does not complain of any sore throat or visual changes.  Respiratory denies any shortness of breath or cough.  Cardiac denies chest pain. Has somewhat chronic lower extremity edema which is not new  GU -no Denies dysuria  Musculoskeletal at this point does not really complain of joint pain  pain.  or any Foot  discomfort  Neurologic is not complaining dizziness headache or syncope.  Psyche-does not complain of overt anxiety or depression    he is afebrile pulse of 84 respirations 18 blood pressure 135/76  Physical Exam   In general this is a pleasant elderly male in no distress lying comfortably in bed which appears relatively baseline.  Skin is warm and dry there is wrapping of his right heel does have venous stasis bilaterally.  PICC line site right upper arm-PICC line has been removed I do not see signs of any concerning erythema or drainage  Eyes sclera and conjunctiva are clear visual acuity appears grossly intact.  Oropharynx mucous membranes moist.  Heart is regular-irregular rate and rhythm-with a slight systolic murmur- has baseline venous stasis changes lower  extremities  Chest clear to auscultation with shallow air entry there is no labored breathing  Abdomen is soft nontender with positive bowel sounds it is somewhat obese.  Abdomen he does have an umbilical hernia iabdomen is soft nontender with positive bowel sound  Musculoskeletal--strength appears preserved he does have dressing over his right ankle venous stasis changes of his lower extremities bilaterally. Has lower extremity weakness baseline upper extremity strength appears preserved  Neurologic is grossly intact to speech is clear no lateralizing findings.  Psych he is alert and oriented pleasant and appropriate  Labs reviewed: Basic Metabolic Panel:  Recent Labs  06/22/16 0443 06/23/16 0452  09/13/16 0615 09/17/16 0720 10/06/16 0500  NA 136 137  < > 136 135 137  K 3.1* 3.6  < > 3.6 3.7 3.5  CL 107 108  < > 99* 98* 100*  CO2 22 22  < > 29 29 29   GLUCOSE 172* 178*  < > 234* 200* 197*  BUN 38* 30*  < > 18 33* 34*  CREATININE 1.03 0.80  < > 0.75 0.89 1.01  CALCIUM 8.6* 8.5*  < > 9.2 9.5 9.6  MG 1.4* 1.6*  --   --   --   --   < > = values in this interval not displayed. Liver Function Tests:  Recent Labs  06/20/16 2219 09/08/16 1545  AST 41 19  ALT 21 14*  ALKPHOS 122 61  BILITOT 2.3* 0.8  PROT 6.4* 6.3*  ALBUMIN 3.7 3.5   No results for input(s): LIPASE, AMYLASE in the last 8760 hours. No results for input(s): AMMONIA in the last 8760 hours. CBC:  Recent Labs  09/08/16 1545  09/13/16 0615 09/17/16 0720 10/06/16 0500 10/12/16 0400  WBC 17.3*  < > 15.3* 17.9* 12.0* 14.8*  NEUTROABS 5.7  --  5.0  --  3.0  --   HGB 12.9*  < > 13.0 12.8* 12.7* 13.8  HCT 39.4  < > 39.0 39.1 38.4* 42.2  MCV 88.5  < > 87.8 88.3 88.9 89.6  PLT 226  < > 188 182 142* 169  < > = values in this interval not displayed. Cardiac Enzymes: No results for input(s): CKTOTAL, CKMB, CKMBINDEX, TROPONINI in the last 8760 hours. BNP: Invalid input(s): POCBNP CBG:  Recent Labs   09/11/16 0734 09/11/16 1132 09/11/16 1610  GLUCAP 292* 264* 231*    Procedures and Imaging Studies During Stay: No results found.  Assessment/Plan:   #1-right heel osteomyelitis foing very well heel wound VAC is been discontinued  He has finished his IV antibiotic and appears to be doing very well-wound is significantly less-she will have followed by wound care physician as well as wound care by home health  #2 diabetes type 2 s on Levemir Amaryl and Actos.  As well as Victoza--has somewhat elevated blood sugars at times over 200 but since he is about to go home will defer to primary care provider for changes  #3 hypertension this appears relatively well controlled as noted above he is on Norvasc hydrochlorothiazide as well as lisinopril   U #4-history of atrial fibrillation this appears rate controlled he is on Coumadin and INR is therapeutic at 2.58 today this will need updating by home health later this week and primary care provider notified for follow-up  He continues on digoxin as well  #5-history of CLL-this is been followed by oncology will update a CBC again baseline white count appears to be around 15,000 was 14,800 on most recent lab will update this in all  #6 history of hyperlipidemia he is on a statin as well as fish oil w-LDL was 71 on lab done earlier this month  #7 anxiety this appears stable on when necessary Ativan.   #8-he is on vitamin D will defer to primary care provider for follow-up possible vitamin D deficiency  Patient will be going home he does have a very supportive wife will need continued PT and OT as well as nursing support for follow up of his wound and multiple medical issues.  He has follow-up with podiatry-INR will need to be rechecked later this week and primary care provider notified of results.  Also will update a CBC BMP before discharge  [id number]-of note greater than 30 minutes spent on this discharge summary-regular than 50% of  time spent coordinating plan of care for numerous diagnoses  .

## 2016-10-27 ENCOUNTER — Other Ambulatory Visit (HOSPITAL_COMMUNITY)
Admission: RE | Admit: 2016-10-27 | Discharge: 2016-10-27 | Disposition: A | Discharge status: Home / Self Care | Payer: Medicare Other | Source: Skilled Nursing Facility | Attending: Internal Medicine | Admitting: Internal Medicine

## 2016-10-27 DIAGNOSIS — Z7901 Long term (current) use of anticoagulants: Secondary | ICD-10-CM | POA: Insufficient documentation

## 2016-10-27 DIAGNOSIS — Z978 Presence of other specified devices: Secondary | ICD-10-CM | POA: Insufficient documentation

## 2016-10-27 DIAGNOSIS — R262 Difficulty in walking, not elsewhere classified: Secondary | ICD-10-CM | POA: Insufficient documentation

## 2016-10-27 DIAGNOSIS — R310 Gross hematuria: Secondary | ICD-10-CM | POA: Insufficient documentation

## 2016-10-27 DIAGNOSIS — E785 Hyperlipidemia, unspecified: Secondary | ICD-10-CM | POA: Insufficient documentation

## 2016-10-27 DIAGNOSIS — M8618 Other acute osteomyelitis, other site: Secondary | ICD-10-CM | POA: Insufficient documentation

## 2016-10-27 LAB — BASIC METABOLIC PANEL
Anion gap: 7 (ref 5–15)
BUN: 25 mg/dL — ABNORMAL HIGH (ref 6–20)
CO2: 30 mmol/L (ref 22–32)
Calcium: 9.6 mg/dL (ref 8.9–10.3)
Chloride: 98 mmol/L — ABNORMAL LOW (ref 101–111)
Creatinine, Ser: 0.96 mg/dL (ref 0.61–1.24)
GFR calc Af Amer: >60 mL/min (ref >60)
Glucose, Bld: 298 mg/dL — ABNORMAL HIGH (ref 65–99)
POTASSIUM: 3.8 mmol/L (ref 3.5–5.1)
Sodium: 135 mmol/L (ref 135–145)

## 2016-10-27 LAB — PROTIME-INR
INR: 2.57
Prothrombin Time: 28.1 seconds — ABNORMAL HIGH (ref 11.4–15.2)

## 2016-10-27 LAB — CBC
HEMATOCRIT: 41.8 % (ref 39.0–52.0)
HEMOGLOBIN: 13.8 g/dL (ref 13.0–17.0)
MCH: 29.2 pg (ref 26.0–34.0)
MCHC: 33 g/dL (ref 30.0–36.0)
MCV: 88.6 fL (ref 78.0–100.0)
Platelets: 158 10*3/uL (ref 150–400)
RBC: 4.72 MIL/uL (ref 4.22–5.81)
RDW: 15.3 % (ref 11.5–15.5)
WBC: 14.6 10*3/uL — ABNORMAL HIGH (ref 4.0–10.5)

## 2016-10-28 DIAGNOSIS — I4891 Unspecified atrial fibrillation: Secondary | ICD-10-CM | POA: Diagnosis not present

## 2016-10-28 DIAGNOSIS — I10 Essential (primary) hypertension: Secondary | ICD-10-CM | POA: Diagnosis not present

## 2016-10-28 DIAGNOSIS — I2510 Atherosclerotic heart disease of native coronary artery without angina pectoris: Secondary | ICD-10-CM | POA: Diagnosis not present

## 2016-10-28 DIAGNOSIS — E1142 Type 2 diabetes mellitus with diabetic polyneuropathy: Secondary | ICD-10-CM | POA: Diagnosis not present

## 2016-10-28 DIAGNOSIS — T8189XD Other complications of procedures, not elsewhere classified, subsequent encounter: Secondary | ICD-10-CM | POA: Diagnosis not present

## 2016-10-28 DIAGNOSIS — M6281 Muscle weakness (generalized): Secondary | ICD-10-CM | POA: Diagnosis not present

## 2016-10-29 ENCOUNTER — Ambulatory Visit (INDEPENDENT_AMBULATORY_CARE_PROVIDER_SITE_OTHER): Payer: Medicare Other | Admitting: *Deleted

## 2016-10-29 DIAGNOSIS — Z7901 Long term (current) use of anticoagulants: Secondary | ICD-10-CM | POA: Diagnosis not present

## 2016-10-29 DIAGNOSIS — M6281 Muscle weakness (generalized): Secondary | ICD-10-CM | POA: Diagnosis not present

## 2016-10-29 DIAGNOSIS — E1142 Type 2 diabetes mellitus with diabetic polyneuropathy: Secondary | ICD-10-CM | POA: Diagnosis not present

## 2016-10-29 DIAGNOSIS — I4891 Unspecified atrial fibrillation: Secondary | ICD-10-CM

## 2016-10-29 DIAGNOSIS — Z5181 Encounter for therapeutic drug level monitoring: Secondary | ICD-10-CM

## 2016-10-29 DIAGNOSIS — I2510 Atherosclerotic heart disease of native coronary artery without angina pectoris: Secondary | ICD-10-CM | POA: Diagnosis not present

## 2016-10-29 DIAGNOSIS — I10 Essential (primary) hypertension: Secondary | ICD-10-CM | POA: Diagnosis not present

## 2016-10-29 DIAGNOSIS — T8189XD Other complications of procedures, not elsewhere classified, subsequent encounter: Secondary | ICD-10-CM | POA: Diagnosis not present

## 2016-10-29 LAB — POCT INR: INR: 2.9

## 2016-11-02 DIAGNOSIS — I2510 Atherosclerotic heart disease of native coronary artery without angina pectoris: Secondary | ICD-10-CM | POA: Diagnosis not present

## 2016-11-02 DIAGNOSIS — I10 Essential (primary) hypertension: Secondary | ICD-10-CM | POA: Diagnosis not present

## 2016-11-02 DIAGNOSIS — I4891 Unspecified atrial fibrillation: Secondary | ICD-10-CM | POA: Diagnosis not present

## 2016-11-02 DIAGNOSIS — E1142 Type 2 diabetes mellitus with diabetic polyneuropathy: Secondary | ICD-10-CM | POA: Diagnosis not present

## 2016-11-02 DIAGNOSIS — T8189XD Other complications of procedures, not elsewhere classified, subsequent encounter: Secondary | ICD-10-CM | POA: Diagnosis not present

## 2016-11-02 DIAGNOSIS — M6281 Muscle weakness (generalized): Secondary | ICD-10-CM | POA: Diagnosis not present

## 2016-11-04 DIAGNOSIS — I2510 Atherosclerotic heart disease of native coronary artery without angina pectoris: Secondary | ICD-10-CM | POA: Diagnosis not present

## 2016-11-04 DIAGNOSIS — E7800 Pure hypercholesterolemia, unspecified: Secondary | ICD-10-CM | POA: Diagnosis not present

## 2016-11-04 DIAGNOSIS — E1142 Type 2 diabetes mellitus with diabetic polyneuropathy: Secondary | ICD-10-CM | POA: Diagnosis not present

## 2016-11-04 DIAGNOSIS — Z6841 Body Mass Index (BMI) 40.0 and over, adult: Secondary | ICD-10-CM | POA: Diagnosis not present

## 2016-11-04 DIAGNOSIS — E1165 Type 2 diabetes mellitus with hyperglycemia: Secondary | ICD-10-CM | POA: Diagnosis not present

## 2016-11-04 DIAGNOSIS — C9191 Lymphoid leukemia, unspecified, in remission: Secondary | ICD-10-CM | POA: Diagnosis not present

## 2016-11-04 DIAGNOSIS — I10 Essential (primary) hypertension: Secondary | ICD-10-CM | POA: Diagnosis not present

## 2016-11-04 DIAGNOSIS — I4891 Unspecified atrial fibrillation: Secondary | ICD-10-CM | POA: Diagnosis not present

## 2016-11-05 ENCOUNTER — Ambulatory Visit (INDEPENDENT_AMBULATORY_CARE_PROVIDER_SITE_OTHER): Payer: Medicare Other | Admitting: *Deleted

## 2016-11-05 DIAGNOSIS — Z7901 Long term (current) use of anticoagulants: Secondary | ICD-10-CM | POA: Diagnosis not present

## 2016-11-05 DIAGNOSIS — Z5181 Encounter for therapeutic drug level monitoring: Secondary | ICD-10-CM

## 2016-11-05 DIAGNOSIS — I4891 Unspecified atrial fibrillation: Secondary | ICD-10-CM | POA: Diagnosis not present

## 2016-11-05 LAB — POCT INR: INR: 2.5

## 2016-11-05 MED ORDER — WARFARIN SODIUM 5 MG PO TABS: ORAL_TABLET | ORAL | 3 refills | Status: DC

## 2016-11-06 DIAGNOSIS — K921 Melena: Secondary | ICD-10-CM | POA: Diagnosis not present

## 2016-11-06 LAB — CBC
HCT: 41.1 % (ref 38.5–50.0)
Hemoglobin: 13.6 g/dL (ref 13.2–17.1)
MCH: 30.7 pg (ref 27.0–33.0)
MCHC: 33.1 g/dL (ref 32.0–36.0)
MCV: 92.8 fL (ref 80.0–100.0)
MPV: 9.7 fL (ref 7.5–12.5)
PLATELETS: 178 10*3/uL (ref 140–400)
RBC: 4.43 MIL/uL (ref 4.20–5.80)
RDW: 15.3 % — AB (ref 11.0–15.0)
WBC: 13.6 10*3/uL — ABNORMAL HIGH (ref 3.8–10.8)

## 2016-11-09 ENCOUNTER — Other Ambulatory Visit (INDEPENDENT_AMBULATORY_CARE_PROVIDER_SITE_OTHER): Payer: Self-pay | Admitting: *Deleted

## 2016-11-09 DIAGNOSIS — E1142 Type 2 diabetes mellitus with diabetic polyneuropathy: Secondary | ICD-10-CM | POA: Diagnosis not present

## 2016-11-09 DIAGNOSIS — I4891 Unspecified atrial fibrillation: Secondary | ICD-10-CM | POA: Diagnosis not present

## 2016-11-09 DIAGNOSIS — K921 Melena: Secondary | ICD-10-CM

## 2016-11-09 DIAGNOSIS — M6281 Muscle weakness (generalized): Secondary | ICD-10-CM | POA: Diagnosis not present

## 2016-11-09 DIAGNOSIS — I10 Essential (primary) hypertension: Secondary | ICD-10-CM | POA: Diagnosis not present

## 2016-11-09 DIAGNOSIS — I2510 Atherosclerotic heart disease of native coronary artery without angina pectoris: Secondary | ICD-10-CM | POA: Diagnosis not present

## 2016-11-09 DIAGNOSIS — T8189XD Other complications of procedures, not elsewhere classified, subsequent encounter: Secondary | ICD-10-CM | POA: Diagnosis not present

## 2016-11-11 ENCOUNTER — Ambulatory Visit (INDEPENDENT_AMBULATORY_CARE_PROVIDER_SITE_OTHER): Payer: Medicare Other | Admitting: Cardiology

## 2016-11-11 DIAGNOSIS — I4891 Unspecified atrial fibrillation: Secondary | ICD-10-CM | POA: Diagnosis not present

## 2016-11-11 DIAGNOSIS — I10 Essential (primary) hypertension: Secondary | ICD-10-CM | POA: Diagnosis not present

## 2016-11-11 DIAGNOSIS — Z5181 Encounter for therapeutic drug level monitoring: Secondary | ICD-10-CM

## 2016-11-11 DIAGNOSIS — M6281 Muscle weakness (generalized): Secondary | ICD-10-CM | POA: Diagnosis not present

## 2016-11-11 DIAGNOSIS — I2510 Atherosclerotic heart disease of native coronary artery without angina pectoris: Secondary | ICD-10-CM | POA: Diagnosis not present

## 2016-11-11 DIAGNOSIS — E1142 Type 2 diabetes mellitus with diabetic polyneuropathy: Secondary | ICD-10-CM | POA: Diagnosis not present

## 2016-11-11 DIAGNOSIS — T8189XD Other complications of procedures, not elsewhere classified, subsequent encounter: Secondary | ICD-10-CM | POA: Diagnosis not present

## 2016-11-11 LAB — POCT INR: INR: 2.8

## 2016-11-13 DIAGNOSIS — S91301D Unspecified open wound, right foot, subsequent encounter: Secondary | ICD-10-CM | POA: Diagnosis not present

## 2016-11-13 DIAGNOSIS — E1142 Type 2 diabetes mellitus with diabetic polyneuropathy: Secondary | ICD-10-CM | POA: Diagnosis not present

## 2016-11-16 DIAGNOSIS — I2510 Atherosclerotic heart disease of native coronary artery without angina pectoris: Secondary | ICD-10-CM | POA: Diagnosis not present

## 2016-11-16 DIAGNOSIS — I10 Essential (primary) hypertension: Secondary | ICD-10-CM | POA: Diagnosis not present

## 2016-11-16 DIAGNOSIS — M6281 Muscle weakness (generalized): Secondary | ICD-10-CM | POA: Diagnosis not present

## 2016-11-16 DIAGNOSIS — I4891 Unspecified atrial fibrillation: Secondary | ICD-10-CM | POA: Diagnosis not present

## 2016-11-16 DIAGNOSIS — E1142 Type 2 diabetes mellitus with diabetic polyneuropathy: Secondary | ICD-10-CM | POA: Diagnosis not present

## 2016-11-16 DIAGNOSIS — T8189XD Other complications of procedures, not elsewhere classified, subsequent encounter: Secondary | ICD-10-CM | POA: Diagnosis not present

## 2016-11-17 ENCOUNTER — Ambulatory Visit: Payer: Medicare Other | Admitting: Cardiology

## 2016-11-18 DIAGNOSIS — T8189XD Other complications of procedures, not elsewhere classified, subsequent encounter: Secondary | ICD-10-CM | POA: Diagnosis not present

## 2016-11-18 DIAGNOSIS — E1142 Type 2 diabetes mellitus with diabetic polyneuropathy: Secondary | ICD-10-CM | POA: Diagnosis not present

## 2016-11-18 DIAGNOSIS — I4891 Unspecified atrial fibrillation: Secondary | ICD-10-CM | POA: Diagnosis not present

## 2016-11-18 DIAGNOSIS — I2510 Atherosclerotic heart disease of native coronary artery without angina pectoris: Secondary | ICD-10-CM | POA: Diagnosis not present

## 2016-11-18 DIAGNOSIS — I10 Essential (primary) hypertension: Secondary | ICD-10-CM | POA: Diagnosis not present

## 2016-11-18 DIAGNOSIS — M6281 Muscle weakness (generalized): Secondary | ICD-10-CM | POA: Diagnosis not present

## 2016-11-20 ENCOUNTER — Encounter (INDEPENDENT_AMBULATORY_CARE_PROVIDER_SITE_OTHER): Payer: Self-pay | Admitting: *Deleted

## 2016-11-20 ENCOUNTER — Other Ambulatory Visit (INDEPENDENT_AMBULATORY_CARE_PROVIDER_SITE_OTHER): Payer: Self-pay | Admitting: *Deleted

## 2016-11-20 DIAGNOSIS — K921 Melena: Secondary | ICD-10-CM

## 2016-11-20 DIAGNOSIS — E1142 Type 2 diabetes mellitus with diabetic polyneuropathy: Secondary | ICD-10-CM | POA: Diagnosis not present

## 2016-11-20 DIAGNOSIS — S91301D Unspecified open wound, right foot, subsequent encounter: Secondary | ICD-10-CM | POA: Diagnosis not present

## 2016-11-24 ENCOUNTER — Ambulatory Visit (INDEPENDENT_AMBULATORY_CARE_PROVIDER_SITE_OTHER): Payer: Medicare Other | Admitting: *Deleted

## 2016-11-24 DIAGNOSIS — I10 Essential (primary) hypertension: Secondary | ICD-10-CM | POA: Diagnosis not present

## 2016-11-24 DIAGNOSIS — M6281 Muscle weakness (generalized): Secondary | ICD-10-CM | POA: Diagnosis not present

## 2016-11-24 DIAGNOSIS — E1142 Type 2 diabetes mellitus with diabetic polyneuropathy: Secondary | ICD-10-CM | POA: Diagnosis not present

## 2016-11-24 DIAGNOSIS — T8189XD Other complications of procedures, not elsewhere classified, subsequent encounter: Secondary | ICD-10-CM | POA: Diagnosis not present

## 2016-11-24 DIAGNOSIS — I2510 Atherosclerotic heart disease of native coronary artery without angina pectoris: Secondary | ICD-10-CM | POA: Diagnosis not present

## 2016-11-24 DIAGNOSIS — Z5181 Encounter for therapeutic drug level monitoring: Secondary | ICD-10-CM

## 2016-11-24 DIAGNOSIS — I4891 Unspecified atrial fibrillation: Secondary | ICD-10-CM | POA: Diagnosis not present

## 2016-11-24 LAB — POCT INR: INR: 2.3

## 2016-12-01 DIAGNOSIS — I4891 Unspecified atrial fibrillation: Secondary | ICD-10-CM | POA: Diagnosis not present

## 2016-12-01 DIAGNOSIS — I2510 Atherosclerotic heart disease of native coronary artery without angina pectoris: Secondary | ICD-10-CM | POA: Diagnosis not present

## 2016-12-01 DIAGNOSIS — M6281 Muscle weakness (generalized): Secondary | ICD-10-CM | POA: Diagnosis not present

## 2016-12-01 DIAGNOSIS — I10 Essential (primary) hypertension: Secondary | ICD-10-CM | POA: Diagnosis not present

## 2016-12-01 DIAGNOSIS — E1142 Type 2 diabetes mellitus with diabetic polyneuropathy: Secondary | ICD-10-CM | POA: Diagnosis not present

## 2016-12-01 DIAGNOSIS — T8189XD Other complications of procedures, not elsewhere classified, subsequent encounter: Secondary | ICD-10-CM | POA: Diagnosis not present

## 2016-12-04 DIAGNOSIS — E1142 Type 2 diabetes mellitus with diabetic polyneuropathy: Secondary | ICD-10-CM | POA: Diagnosis not present

## 2016-12-04 DIAGNOSIS — S91301D Unspecified open wound, right foot, subsequent encounter: Secondary | ICD-10-CM | POA: Diagnosis not present

## 2016-12-08 ENCOUNTER — Encounter: Payer: Self-pay | Admitting: Cardiology

## 2016-12-08 ENCOUNTER — Ambulatory Visit (INDEPENDENT_AMBULATORY_CARE_PROVIDER_SITE_OTHER): Payer: Medicare Other | Admitting: Cardiology

## 2016-12-08 VITALS — BP 132/60 | HR 75 | Ht 72.0 in | Wt 289.8 lb

## 2016-12-08 DIAGNOSIS — I2510 Atherosclerotic heart disease of native coronary artery without angina pectoris: Secondary | ICD-10-CM | POA: Diagnosis not present

## 2016-12-08 DIAGNOSIS — I482 Chronic atrial fibrillation, unspecified: Secondary | ICD-10-CM

## 2016-12-08 DIAGNOSIS — I4891 Unspecified atrial fibrillation: Secondary | ICD-10-CM | POA: Diagnosis not present

## 2016-12-08 DIAGNOSIS — M6281 Muscle weakness (generalized): Secondary | ICD-10-CM | POA: Diagnosis not present

## 2016-12-08 DIAGNOSIS — T8189XD Other complications of procedures, not elsewhere classified, subsequent encounter: Secondary | ICD-10-CM | POA: Diagnosis not present

## 2016-12-08 DIAGNOSIS — I10 Essential (primary) hypertension: Secondary | ICD-10-CM | POA: Diagnosis not present

## 2016-12-08 DIAGNOSIS — E1142 Type 2 diabetes mellitus with diabetic polyneuropathy: Secondary | ICD-10-CM | POA: Diagnosis not present

## 2016-12-08 NOTE — Progress Notes (Signed)
Cardiology Office Note  Date: 12/08/2016   ID: [name], DOB June 26, 1934, MRN [id number]  PCP: [name], MD  Primary Cardiologist: [name], MD   Chief Complaint  Patient presents with  . Atrial Fibrillation    History of Present Illness: [name] [name] is an 82 y.o. male last seen in October 2017. He presents for a follow-up visit. I reviewed his records. He had an extended hospital stay and rehabilitation after urosepsis and also right heel osteomyelitis. He had a wound VAC in place and also extended treatment with IV antibiotics through a PICC line. He has been back at home since late July and states that he is doing reasonably well.  I reviewed his medications which are outlined below in stable from a cardiac perspective. He reports no palpitations or chest pain.  He continues to follow in the anticoagulation clinic on Coumadin. INR today was 2.3. He reports no bleeding problems.  Past Medical History:  Diagnosis Date  . Atrial fibrillation ([place])   . Chronic lymphocytic leukemia ([place])   . Coronary atherosclerosis of native coronary artery    Nonobstructive  . Essential hypertension, benign   . Hyperlipidemia   . IgA nephropathy    Dr. Lowanda Foster  . Type 2 diabetes mellitus ([place])   . UTI (urinary tract infection) july  10 th   taking  med     Past Surgical History:  Procedure Laterality Date  . AMPUTATION Left 09/15/2012   Procedure: PARTIAL AMPUTATION 3rd TOE LEFT FOOT;  Surgeon: Marcheta Grammes, DPM;  Location: [facility];  Service: Orthopedics;  Laterality: Left;  . AMPUTATION Left 04/20/2013   Procedure: PARTIAL AMPUTATION 2ND TOE LEFT FOOT;  Surgeon: Marcheta Grammes, DPM;  Location: [facility];  Service: Orthopedics;  Laterality: Left;  . ANAL FISSURE REPAIR    . APPLICATION OF WOUND VAC Right 09/09/2016   Procedure: APPLICATION OF WOUND VAC;  Surgeon: Caprice Beaver, DPM;  Location: [facility];  Service: Podiatry;  Laterality: Right;  . CATARACT  EXTRACTION W/PHACO Right 09/30/2015   Procedure: CATARACT EXTRACTION PHACO AND INTRAOCULAR LENS PLACEMENT (IOC);  Surgeon: Tonny Branch, MD;  Location: [facility];  Service: Ophthalmology;  Laterality: Right;  CDE: 11.78  . CATARACT EXTRACTION W/PHACO Left 10/31/2015   Procedure: CATARACT EXTRACTION PHACO AND INTRAOCULAR LENS PLACEMENT LEFT EYE; CDE:  9.10;  Surgeon: Tonny Branch, MD;  Location: [facility];  Service: Ophthalmology;  Laterality: Left;  . COLONOSCOPY  08/19/2011   Procedure: COLONOSCOPY;  Surgeon: Rogene Houston, MD;  [facility]: [facility];  Service: Endoscopy;  Laterality: N/A;  930  . COLONOSCOPY N/A 09/19/2014   Procedure: COLONOSCOPY;  Surgeon: Rogene Houston, MD;  Location: [facility];  Service: Endoscopy;  Laterality: N/A;  1055  . FEMORAL HERNIA REPAIR    . INCISION AND DRAINAGE Right 09/09/2016   Procedure: DEBRIDEMENT WOUND RT heel with wound vac attachment;  Surgeon: Caprice Beaver, DPM;  Location: [facility];  Service: Podiatry;  Laterality: Right;  . INCISION AND DRAINAGE OF WOUND Right 08/12/2016   Procedure: DEBRIDEMENT WOUND RIGHT HEEL;  Surgeon: Caprice Beaver, DPM;  Location: [facility];  Service: Podiatry;  Laterality: Right;  right heel  . Open repair left quadriceps tendon.  08/22/07   Dr. Aline Brochure  . Right total knee replacement    . TRANSURETHRAL RESECTION OF PROSTATE    . WOUND EXPLORATION Right 08/12/2016   Procedure: EXPLORATION OF WOUND FOR FOREIGN BODY RIGHT HEEL;  Surgeon: Caprice Beaver, DPM;  Location: [facility];  Service: Podiatry;  Laterality: Right;  right heel    Current Outpatient Prescriptions  Medication Sig Dispense Refill  . amLODipine (NORVASC) 5 MG tablet Take 5 mg by mouth daily with breakfast.    . Ascorbic Acid (VITAMIN C) 1000 MG tablet Take 1,000 mg by mouth daily with breakfast.     . atorvastatin (LIPITOR) 10 MG tablet Take 10 mg by mouth every evening.    . Calcium Carb-Cholecalciferol (CALCIUM 600+D) 600-800 MG-UNIT TABS Take 2 tablets by  mouth daily with breakfast.    . cholecalciferol (VITAMIN D) 1000 units tablet Take 1,000 Units by mouth every morning.    . Coenzyme Q10 (CO Q-10) 100 MG CAPS Take 100 mg by mouth daily with breakfast.     . digoxin (LANOXIN) 0.125 MG tablet Take 0.125 mg by mouth daily with breakfast.     . Fish Oil-Cholecalciferol (FISH OIL + D3) 1200-1000 MG-UNIT CAPS Take 2 capsules by mouth 2 (two) times daily.    Marland Kitchen glimepiride (AMARYL) 2 MG tablet Take 2 mg by mouth daily with breakfast.     . hydrochlorothiazide 25 MG tablet Take 25 mg by mouth daily with breakfast.     . insulin aspart (NOVOLOG FLEXPEN) 100 UNIT/ML FlexPen Pt uses per sliding scale before lunch and dinner.    . Insulin Detemir (LEVEMIR FLEXPEN) 100 UNIT/ML Pen Inject 20 Units into the skin every morning.    Marland Kitchen ipratropium (ATROVENT) 0.06 % nasal spray Place 2 sprays into both nostrils 2 (two) times daily.    Marland Kitchen lisinopril (PRINIVIL,ZESTRIL) 20 MG tablet Take 40 mg by mouth daily with supper.    Marland Kitchen LORazepam (ATIVAN) 2 MG tablet Take one tablet by mouth at bedtime for nervousness and inability to rest. 30 tablet 0  . Multiple Vitamin (MULTIVITAMIN WITH MINERALS) TABS tablet Take 1 tablet by mouth daily with breakfast.    . pioglitazone (ACTOS) 30 MG tablet Take 30 mg by mouth every evening.     Marland Kitchen VICTOZA 18 MG/3ML SOPN Inject 1.8 mg into the skin daily with breakfast.    . vitamin E 400 UNIT capsule Take 400 Units by mouth daily with breakfast.     . warfarin (COUMADIN) 5 MG tablet Take 1 tablet daily except 1 1/2 tablets on Mondays, Wednesdays and Fridays 45 tablet 3   No current facility-administered medications for this visit.    Allergies:  Erythromycin and Penicillins   Social History: The patient  reports that he quit smoking about 53 years ago. His smoking use included Cigarettes. He quit after 10.00 years of use. He has never used smokeless tobacco. He reports that he does not drink alcohol or use drugs.   ROS:  Please see the  history of present illness. Otherwise, complete review of systems is positive for hearing loss.  All other systems are reviewed and negative.   Physical Exam: VS:  BP 132/60   Pulse 75   Ht 6' (1.829 m)   Wt 289 lb 12.8 oz (131.5 kg)   SpO2 97%   BMI 39.30 kg/m , BMI Body mass index is 39.3 kg/m.  Wt Readings from Last 3 Encounters:  12/08/16 289 lb 12.8 oz (131.5 kg)  09/08/16 286 lb 3.2 oz (129.8 kg)  08/11/16 285 lb (129.3 kg)    General: Obese male, appears comfortable at rest. Using a cane. HEENT: Conjunctiva and lids normal, oropharynx clear. Neck: Supple, no elevated JVP or carotid bruits, no thyromegaly. Lungs: Clear to auscultation,  nonlabored breathing at rest. Cardiac: Irregularly irregular, no S3, 2/6 systolic murmur, no pericardial rub. Abdomen: Obese, nontender, bowel sounds present. Extremities: No pitting edema, distal pulses 2+. Skin: Warm and dry. Musculoskeletal: No kyphosis. Neuropsychiatric: Alert and oriented x3, affect grossly appropriate.  ECG: I personally reviewed the tracing from 08/11/2016 which showed atrial fibrillation with right bundle branch block and left anterior fascicular block.  Recent Labwork: 06/23/2016: Magnesium 1.6 09/08/2016: ALT 14; AST 19 10/27/2016: BUN 25; Creatinine, Ser 0.96; Potassium 3.8; Sodium 135 11/06/2016: Hemoglobin 13.6; Platelets 178     Component Value Date/Time   CHOL 124 10/06/2016 0500   TRIG 150 (H) 10/06/2016 0500   HDL 23 (L) 10/06/2016 0500   CHOLHDL 5.4 10/06/2016 0500   VLDL 30 10/06/2016 0500   LDLCALC 71 10/06/2016 0500    Other Studies Reviewed Today:  Lower extremity arterial Dopplers 09/09/2016: IMPRESSION: Segmental noninvasive evaluation is limited due to extensive vascular noncompressibility presumably secondary to medial calcification. However, the normal distal arterial waveforms suggest no high-grade lower extremity arterial occlusive disease at rest.  If there is continued high clinical  concern of hemodynamically significant lower extremity arterial occlusive disease, recommend MRA runoff for better evaluation.  Echocardiogram July 2013: Mild to moderate LVH with LVEF greater than 65%, moderate left atrial enlargement, mild mitral regurgitation, trace aortic regurgitation, mildly dilated right ventricle and moderately dilated right atrium, RVSP 40 mmHg.  Assessment and Plan:  1. Chronic atrial fibrillation. Heart rate controlled on Lanoxin. He continues on Coumadin with follow-up in the anticoagulation clinic. Recent INR 2.2.  2. Essential hypertension. Blood pressure control is adequate today on current regimen. No changes were made.  Current medicines were reviewed with the patient today.  Disposition: Follow-up in 6 months.  Signed, Satira Sark, MD, [facility] 12/08/2016 3:01 PM    [facility] [place], [place], [place] [address] Phone: [phone]; Fax: [phone]

## 2016-12-08 NOTE — Patient Instructions (Signed)

## 2016-12-14 ENCOUNTER — Encounter (INDEPENDENT_AMBULATORY_CARE_PROVIDER_SITE_OTHER): Payer: Medicare Other

## 2016-12-14 ENCOUNTER — Encounter (INDEPENDENT_AMBULATORY_CARE_PROVIDER_SITE_OTHER): Payer: Self-pay | Admitting: Vascular Surgery

## 2016-12-22 ENCOUNTER — Ambulatory Visit (INDEPENDENT_AMBULATORY_CARE_PROVIDER_SITE_OTHER): Payer: Medicare Other | Admitting: *Deleted

## 2016-12-22 DIAGNOSIS — Z5181 Encounter for therapeutic drug level monitoring: Secondary | ICD-10-CM

## 2016-12-22 DIAGNOSIS — Z7901 Long term (current) use of anticoagulants: Secondary | ICD-10-CM

## 2016-12-22 DIAGNOSIS — I4891 Unspecified atrial fibrillation: Secondary | ICD-10-CM | POA: Diagnosis not present

## 2016-12-22 LAB — POCT INR: INR: 2.6

## 2017-01-04 DIAGNOSIS — I10 Essential (primary) hypertension: Secondary | ICD-10-CM | POA: Diagnosis not present

## 2017-01-04 DIAGNOSIS — I4891 Unspecified atrial fibrillation: Secondary | ICD-10-CM | POA: Diagnosis not present

## 2017-01-04 DIAGNOSIS — E119 Type 2 diabetes mellitus without complications: Secondary | ICD-10-CM | POA: Diagnosis not present

## 2017-01-10 DIAGNOSIS — Z23 Encounter for immunization: Secondary | ICD-10-CM | POA: Diagnosis not present

## 2017-01-19 ENCOUNTER — Ambulatory Visit (INDEPENDENT_AMBULATORY_CARE_PROVIDER_SITE_OTHER): Payer: Medicare Other | Admitting: *Deleted

## 2017-01-19 DIAGNOSIS — Z7901 Long term (current) use of anticoagulants: Secondary | ICD-10-CM

## 2017-01-19 DIAGNOSIS — I4891 Unspecified atrial fibrillation: Secondary | ICD-10-CM | POA: Diagnosis not present

## 2017-01-19 DIAGNOSIS — Z5181 Encounter for therapeutic drug level monitoring: Secondary | ICD-10-CM

## 2017-01-19 LAB — POCT INR: INR: 2.5

## 2017-01-26 DIAGNOSIS — E1165 Type 2 diabetes mellitus with hyperglycemia: Secondary | ICD-10-CM | POA: Diagnosis not present

## 2017-01-26 DIAGNOSIS — Z299 Encounter for prophylactic measures, unspecified: Secondary | ICD-10-CM | POA: Diagnosis not present

## 2017-01-26 DIAGNOSIS — I4891 Unspecified atrial fibrillation: Secondary | ICD-10-CM | POA: Diagnosis not present

## 2017-01-26 DIAGNOSIS — C9191 Lymphoid leukemia, unspecified, in remission: Secondary | ICD-10-CM | POA: Diagnosis not present

## 2017-01-26 DIAGNOSIS — E1142 Type 2 diabetes mellitus with diabetic polyneuropathy: Secondary | ICD-10-CM | POA: Diagnosis not present

## 2017-01-26 DIAGNOSIS — Z6841 Body Mass Index (BMI) 40.0 and over, adult: Secondary | ICD-10-CM | POA: Diagnosis not present

## 2017-02-12 DIAGNOSIS — S91301D Unspecified open wound, right foot, subsequent encounter: Secondary | ICD-10-CM | POA: Diagnosis not present

## 2017-02-12 DIAGNOSIS — E1142 Type 2 diabetes mellitus with diabetic polyneuropathy: Secondary | ICD-10-CM | POA: Diagnosis not present

## 2017-02-15 ENCOUNTER — Ambulatory Visit (INDEPENDENT_AMBULATORY_CARE_PROVIDER_SITE_OTHER): Payer: Medicare Other | Admitting: Otolaryngology

## 2017-02-15 DIAGNOSIS — H9201 Otalgia, right ear: Secondary | ICD-10-CM

## 2017-02-23 DIAGNOSIS — R3915 Urgency of urination: Secondary | ICD-10-CM | POA: Diagnosis not present

## 2017-02-23 DIAGNOSIS — N401 Enlarged prostate with lower urinary tract symptoms: Secondary | ICD-10-CM | POA: Diagnosis not present

## 2017-03-02 ENCOUNTER — Ambulatory Visit (INDEPENDENT_AMBULATORY_CARE_PROVIDER_SITE_OTHER): Payer: Medicare Other | Admitting: *Deleted

## 2017-03-02 DIAGNOSIS — I4821 Permanent atrial fibrillation: Secondary | ICD-10-CM

## 2017-03-02 DIAGNOSIS — Z5181 Encounter for therapeutic drug level monitoring: Secondary | ICD-10-CM

## 2017-03-02 DIAGNOSIS — Z7901 Long term (current) use of anticoagulants: Secondary | ICD-10-CM | POA: Diagnosis not present

## 2017-03-02 DIAGNOSIS — I482 Chronic atrial fibrillation: Secondary | ICD-10-CM

## 2017-03-02 DIAGNOSIS — I4891 Unspecified atrial fibrillation: Secondary | ICD-10-CM

## 2017-03-02 LAB — POCT INR: INR: 3.4

## 2017-03-16 DIAGNOSIS — I4891 Unspecified atrial fibrillation: Secondary | ICD-10-CM | POA: Diagnosis not present

## 2017-03-16 DIAGNOSIS — E119 Type 2 diabetes mellitus without complications: Secondary | ICD-10-CM | POA: Diagnosis not present

## 2017-03-16 DIAGNOSIS — I10 Essential (primary) hypertension: Secondary | ICD-10-CM | POA: Diagnosis not present

## 2017-03-22 DIAGNOSIS — E7800 Pure hypercholesterolemia, unspecified: Secondary | ICD-10-CM | POA: Diagnosis not present

## 2017-03-22 DIAGNOSIS — Z7189 Other specified counseling: Secondary | ICD-10-CM | POA: Diagnosis not present

## 2017-03-22 DIAGNOSIS — Z1211 Encounter for screening for malignant neoplasm of colon: Secondary | ICD-10-CM | POA: Diagnosis not present

## 2017-03-22 DIAGNOSIS — Z1339 Encounter for screening examination for other mental health and behavioral disorders: Secondary | ICD-10-CM | POA: Diagnosis not present

## 2017-03-22 DIAGNOSIS — Z6841 Body Mass Index (BMI) 40.0 and over, adult: Secondary | ICD-10-CM | POA: Diagnosis not present

## 2017-03-22 DIAGNOSIS — R5383 Other fatigue: Secondary | ICD-10-CM | POA: Diagnosis not present

## 2017-03-22 DIAGNOSIS — Z299 Encounter for prophylactic measures, unspecified: Secondary | ICD-10-CM | POA: Diagnosis not present

## 2017-03-22 DIAGNOSIS — Z1331 Encounter for screening for depression: Secondary | ICD-10-CM | POA: Diagnosis not present

## 2017-03-22 DIAGNOSIS — Z79899 Other long term (current) drug therapy: Secondary | ICD-10-CM | POA: Diagnosis not present

## 2017-03-22 DIAGNOSIS — I10 Essential (primary) hypertension: Secondary | ICD-10-CM | POA: Diagnosis not present

## 2017-03-22 DIAGNOSIS — Z0000 Encounter for general adult medical examination without abnormal findings: Secondary | ICD-10-CM | POA: Diagnosis not present

## 2017-03-23 ENCOUNTER — Ambulatory Visit (INDEPENDENT_AMBULATORY_CARE_PROVIDER_SITE_OTHER): Payer: Medicare Other | Admitting: *Deleted

## 2017-03-23 DIAGNOSIS — Z7901 Long term (current) use of anticoagulants: Secondary | ICD-10-CM | POA: Diagnosis not present

## 2017-03-23 DIAGNOSIS — I4891 Unspecified atrial fibrillation: Secondary | ICD-10-CM | POA: Diagnosis not present

## 2017-03-23 DIAGNOSIS — R5383 Other fatigue: Secondary | ICD-10-CM | POA: Diagnosis not present

## 2017-03-23 DIAGNOSIS — Z125 Encounter for screening for malignant neoplasm of prostate: Secondary | ICD-10-CM | POA: Diagnosis not present

## 2017-03-23 DIAGNOSIS — Z79899 Other long term (current) drug therapy: Secondary | ICD-10-CM | POA: Diagnosis not present

## 2017-03-23 DIAGNOSIS — E7800 Pure hypercholesterolemia, unspecified: Secondary | ICD-10-CM | POA: Diagnosis not present

## 2017-03-23 DIAGNOSIS — Z5181 Encounter for therapeutic drug level monitoring: Secondary | ICD-10-CM | POA: Diagnosis not present

## 2017-03-23 LAB — POCT INR: INR: 1.9

## 2017-03-23 NOTE — Patient Instructions (Signed)
Continue coumadin 5mg  daily except 7.5mg  on Mondays, Wednesdays and Fridays  Will not boost coumadin today due to recent nose bleeds.  Recommend pt get Saline nose spray to use as needed for moisture in nose. Recheck in 3 weeks

## 2017-04-01 ENCOUNTER — Ambulatory Visit (INDEPENDENT_AMBULATORY_CARE_PROVIDER_SITE_OTHER): Payer: Medicare Other | Admitting: Otolaryngology

## 2017-04-01 DIAGNOSIS — H608X1 Other otitis externa, right ear: Secondary | ICD-10-CM | POA: Diagnosis not present

## 2017-04-13 ENCOUNTER — Ambulatory Visit (INDEPENDENT_AMBULATORY_CARE_PROVIDER_SITE_OTHER): Payer: Medicare Other | Admitting: *Deleted

## 2017-04-13 DIAGNOSIS — Z5181 Encounter for therapeutic drug level monitoring: Secondary | ICD-10-CM

## 2017-04-13 DIAGNOSIS — I4891 Unspecified atrial fibrillation: Secondary | ICD-10-CM

## 2017-04-13 DIAGNOSIS — Z7901 Long term (current) use of anticoagulants: Secondary | ICD-10-CM

## 2017-04-13 LAB — POCT INR: INR: 2.4

## 2017-04-13 MED ORDER — WARFARIN SODIUM 5 MG PO TABS: ORAL_TABLET | ORAL | 3 refills | Status: DC

## 2017-04-13 NOTE — Patient Instructions (Signed)
Continue coumadin 5mg  daily except 7.5mg  on Mondays, Wednesdays and Fridays  Recheck in 3 weeks

## 2017-04-15 ENCOUNTER — Telehealth: Payer: Self-pay | Admitting: *Deleted

## 2017-04-15 MED ORDER — WARFARIN SODIUM 5 MG PO TABS: ORAL_TABLET | ORAL | 3 refills | Status: DC

## 2017-04-15 NOTE — Telephone Encounter (Signed)
Mr. Kouba called requesting to speak with Edrick Oh.  Please call his home # [phone].

## 2017-04-15 NOTE — Telephone Encounter (Signed)
Warfarin Rx sent to [facility] for 90 day supply instead of 30 day supply.

## 2017-04-22 ENCOUNTER — Ambulatory Visit (INDEPENDENT_AMBULATORY_CARE_PROVIDER_SITE_OTHER): Payer: Medicare Other | Admitting: Otolaryngology

## 2017-04-22 DIAGNOSIS — H903 Sensorineural hearing loss, bilateral: Secondary | ICD-10-CM

## 2017-04-22 DIAGNOSIS — J310 Chronic rhinitis: Secondary | ICD-10-CM | POA: Diagnosis not present

## 2017-04-23 DIAGNOSIS — B351 Tinea unguium: Secondary | ICD-10-CM | POA: Diagnosis not present

## 2017-04-23 DIAGNOSIS — E1142 Type 2 diabetes mellitus with diabetic polyneuropathy: Secondary | ICD-10-CM | POA: Diagnosis not present

## 2017-04-23 DIAGNOSIS — L851 Acquired keratosis [keratoderma] palmaris et plantaris: Secondary | ICD-10-CM | POA: Diagnosis not present

## 2017-05-03 DIAGNOSIS — Z6841 Body Mass Index (BMI) 40.0 and over, adult: Secondary | ICD-10-CM | POA: Diagnosis not present

## 2017-05-03 DIAGNOSIS — E7800 Pure hypercholesterolemia, unspecified: Secondary | ICD-10-CM | POA: Diagnosis not present

## 2017-05-03 DIAGNOSIS — C9191 Lymphoid leukemia, unspecified, in remission: Secondary | ICD-10-CM | POA: Diagnosis not present

## 2017-05-03 DIAGNOSIS — Z299 Encounter for prophylactic measures, unspecified: Secondary | ICD-10-CM | POA: Diagnosis not present

## 2017-05-03 DIAGNOSIS — R600 Localized edema: Secondary | ICD-10-CM | POA: Diagnosis not present

## 2017-05-03 DIAGNOSIS — E1165 Type 2 diabetes mellitus with hyperglycemia: Secondary | ICD-10-CM | POA: Diagnosis not present

## 2017-05-03 DIAGNOSIS — E1142 Type 2 diabetes mellitus with diabetic polyneuropathy: Secondary | ICD-10-CM | POA: Diagnosis not present

## 2017-05-03 DIAGNOSIS — N419 Inflammatory disease of prostate, unspecified: Secondary | ICD-10-CM | POA: Diagnosis not present

## 2017-05-06 DIAGNOSIS — E119 Type 2 diabetes mellitus without complications: Secondary | ICD-10-CM | POA: Diagnosis not present

## 2017-05-06 DIAGNOSIS — E1165 Type 2 diabetes mellitus with hyperglycemia: Secondary | ICD-10-CM | POA: Diagnosis not present

## 2017-05-06 DIAGNOSIS — Z7984 Long term (current) use of oral hypoglycemic drugs: Secondary | ICD-10-CM | POA: Diagnosis not present

## 2017-05-06 DIAGNOSIS — Z961 Presence of intraocular lens: Secondary | ICD-10-CM | POA: Diagnosis not present

## 2017-05-11 ENCOUNTER — Ambulatory Visit (INDEPENDENT_AMBULATORY_CARE_PROVIDER_SITE_OTHER): Payer: Medicare Other | Admitting: *Deleted

## 2017-05-11 DIAGNOSIS — Z5181 Encounter for therapeutic drug level monitoring: Secondary | ICD-10-CM

## 2017-05-11 DIAGNOSIS — Z7901 Long term (current) use of anticoagulants: Secondary | ICD-10-CM | POA: Diagnosis not present

## 2017-05-11 DIAGNOSIS — I4891 Unspecified atrial fibrillation: Secondary | ICD-10-CM

## 2017-05-11 LAB — POCT INR: INR: 2.2

## 2017-05-11 NOTE — Patient Instructions (Signed)
Continue coumadin 5mg  daily except 7.5mg  on Mondays, Wednesdays and Fridays  Recheck in 4 weeks

## 2017-06-08 ENCOUNTER — Ambulatory Visit (INDEPENDENT_AMBULATORY_CARE_PROVIDER_SITE_OTHER): Payer: Medicare Other | Admitting: *Deleted

## 2017-06-08 DIAGNOSIS — Z5181 Encounter for therapeutic drug level monitoring: Secondary | ICD-10-CM

## 2017-06-08 DIAGNOSIS — Z7901 Long term (current) use of anticoagulants: Secondary | ICD-10-CM | POA: Diagnosis not present

## 2017-06-08 DIAGNOSIS — I4891 Unspecified atrial fibrillation: Secondary | ICD-10-CM | POA: Diagnosis not present

## 2017-06-08 LAB — POCT INR: INR: 2.9

## 2017-06-08 NOTE — Patient Instructions (Signed)
Continue coumadin 5mg  daily except 7.5mg  on Mondays, Wednesdays and Fridays  Recheck in 6 weeks

## 2017-06-16 DIAGNOSIS — I4891 Unspecified atrial fibrillation: Secondary | ICD-10-CM | POA: Diagnosis not present

## 2017-06-16 DIAGNOSIS — R600 Localized edema: Secondary | ICD-10-CM | POA: Diagnosis not present

## 2017-06-16 DIAGNOSIS — C9191 Lymphoid leukemia, unspecified, in remission: Secondary | ICD-10-CM | POA: Diagnosis not present

## 2017-06-16 DIAGNOSIS — Z299 Encounter for prophylactic measures, unspecified: Secondary | ICD-10-CM | POA: Diagnosis not present

## 2017-06-16 DIAGNOSIS — Z6841 Body Mass Index (BMI) 40.0 and over, adult: Secondary | ICD-10-CM | POA: Diagnosis not present

## 2017-06-16 DIAGNOSIS — E1142 Type 2 diabetes mellitus with diabetic polyneuropathy: Secondary | ICD-10-CM | POA: Diagnosis not present

## 2017-06-16 DIAGNOSIS — N400 Enlarged prostate without lower urinary tract symptoms: Secondary | ICD-10-CM | POA: Diagnosis not present

## 2017-06-16 DIAGNOSIS — E1165 Type 2 diabetes mellitus with hyperglycemia: Secondary | ICD-10-CM | POA: Diagnosis not present

## 2017-06-16 DIAGNOSIS — Z789 Other specified health status: Secondary | ICD-10-CM | POA: Diagnosis not present

## 2017-06-16 DIAGNOSIS — I10 Essential (primary) hypertension: Secondary | ICD-10-CM | POA: Diagnosis not present

## 2017-06-16 DIAGNOSIS — F419 Anxiety disorder, unspecified: Secondary | ICD-10-CM | POA: Diagnosis not present

## 2017-06-23 DIAGNOSIS — I4891 Unspecified atrial fibrillation: Secondary | ICD-10-CM | POA: Diagnosis not present

## 2017-06-23 DIAGNOSIS — I10 Essential (primary) hypertension: Secondary | ICD-10-CM | POA: Diagnosis not present

## 2017-06-23 DIAGNOSIS — E119 Type 2 diabetes mellitus without complications: Secondary | ICD-10-CM | POA: Diagnosis not present

## 2017-06-29 ENCOUNTER — Emergency Department (HOSPITAL_COMMUNITY)
Admission: EM | Admit: 2017-06-29 | Discharge: 2017-06-29 | Disposition: A | Discharge status: Home / Self Care | Payer: Medicare Other | Attending: Emergency Medicine | Admitting: Emergency Medicine

## 2017-06-29 ENCOUNTER — Emergency Department (HOSPITAL_COMMUNITY): Payer: Medicare Other

## 2017-06-29 ENCOUNTER — Encounter (HOSPITAL_COMMUNITY): Payer: Self-pay | Admitting: Cardiology

## 2017-06-29 DIAGNOSIS — Z794 Long term (current) use of insulin: Secondary | ICD-10-CM | POA: Diagnosis not present

## 2017-06-29 DIAGNOSIS — Z87891 Personal history of nicotine dependence: Secondary | ICD-10-CM | POA: Insufficient documentation

## 2017-06-29 DIAGNOSIS — R0601 Orthopnea: Secondary | ICD-10-CM | POA: Diagnosis not present

## 2017-06-29 DIAGNOSIS — R0602 Shortness of breath: Secondary | ICD-10-CM | POA: Diagnosis not present

## 2017-06-29 DIAGNOSIS — I10 Essential (primary) hypertension: Secondary | ICD-10-CM | POA: Diagnosis not present

## 2017-06-29 DIAGNOSIS — R0789 Other chest pain: Secondary | ICD-10-CM | POA: Diagnosis not present

## 2017-06-29 DIAGNOSIS — E119 Type 2 diabetes mellitus without complications: Secondary | ICD-10-CM | POA: Diagnosis not present

## 2017-06-29 DIAGNOSIS — Z79899 Other long term (current) drug therapy: Secondary | ICD-10-CM | POA: Diagnosis not present

## 2017-06-29 LAB — CBC WITH DIFFERENTIAL/PLATELET
BASOS PCT: 0 %
Basophils Absolute: 0 10*3/uL (ref 0.0–0.1)
EOS PCT: 3 %
Eosinophils Absolute: 0.4 10*3/uL (ref 0.0–0.7)
HEMATOCRIT: 40.3 % (ref 39.0–52.0)
HEMOGLOBIN: 12.9 g/dL — AB (ref 13.0–17.0)
LYMPHS PCT: 54 %
Lymphs Abs: 7.3 10*3/uL — ABNORMAL HIGH (ref 0.7–4.0)
MCH: 29.1 pg (ref 26.0–34.0)
MCHC: 32 g/dL (ref 30.0–36.0)
MCV: 90.8 fL (ref 78.0–100.0)
MONOS PCT: 8 %
Monocytes Absolute: 1.1 10*3/uL — ABNORMAL HIGH (ref 0.1–1.0)
NEUTROS PCT: 35 %
Neutro Abs: 4.8 10*3/uL (ref 1.7–7.7)
Platelets: 141 10*3/uL — ABNORMAL LOW (ref 150–400)
RBC: 4.44 MIL/uL (ref 4.22–5.81)
RDW: 15.1 % (ref 11.5–15.5)
WBC: 13.6 10*3/uL — ABNORMAL HIGH (ref 4.0–10.5)

## 2017-06-29 LAB — COMPREHENSIVE METABOLIC PANEL
ALK PHOS: 97 U/L (ref 38–126)
ALT: 47 U/L (ref 17–63)
AST: 32 U/L (ref 15–41)
Albumin: 3.3 g/dL — ABNORMAL LOW (ref 3.5–5.0)
Anion gap: 11 (ref 5–15)
BILIRUBIN TOTAL: 1.2 mg/dL (ref 0.3–1.2)
BUN: 21 mg/dL — ABNORMAL HIGH (ref 6–20)
CALCIUM: 9 mg/dL (ref 8.9–10.3)
CO2: 26 mmol/L (ref 22–32)
CREATININE: 0.76 mg/dL (ref 0.61–1.24)
Chloride: 103 mmol/L (ref 101–111)
Glucose, Bld: 145 mg/dL — ABNORMAL HIGH (ref 65–99)
Potassium: 3.6 mmol/L (ref 3.5–5.1)
SODIUM: 140 mmol/L (ref 135–145)
TOTAL PROTEIN: 6.1 g/dL — AB (ref 6.5–8.1)

## 2017-06-29 LAB — TROPONIN I: Troponin I: 0.03 ng/mL (ref <0.03)

## 2017-06-29 LAB — BRAIN NATRIURETIC PEPTIDE: B NATRIURETIC PEPTIDE 5: 179 pg/mL — AB (ref 0.0–100.0)

## 2017-06-29 LAB — PROTIME-INR
INR: 2.28
Prothrombin Time: 25 seconds — ABNORMAL HIGH (ref 11.4–15.2)

## 2017-06-29 LAB — DIGOXIN LEVEL: Digoxin Level: 0.6 ng/mL — ABNORMAL LOW (ref 0.8–2.0)

## 2017-06-29 MED ORDER — FUROSEMIDE 10 MG/ML IJ SOLN
40.0000 mg | Freq: Once | INTRAMUSCULAR | Status: AC
  Administered 2017-06-29: 40 mg via INTRAVENOUS
  Filled 2017-06-29: qty 4

## 2017-06-29 MED ORDER — FUROSEMIDE 20 MG PO TABS: ORAL_TABLET | ORAL | 0 refills | Status: DC

## 2017-06-29 NOTE — Discharge Instructions (Signed)
Increase lasix to 40mg  a day.  Follow up with cardiology next week or sooner

## 2017-06-29 NOTE — ED Notes (Signed)
Date and time results received: 06/29/17 1423  Test: Troponin Critical Value: 0.03  Name of Provider Notified: Dr. Roderic Palau  Orders Received? Or Actions Taken?: None given

## 2017-06-29 NOTE — ED Triage Notes (Addendum)
Cold symptoms since Saturday.  Sob since yesterday.  Sob worse this morning.  Labored breathing in triage with sats 96%.

## 2017-06-29 NOTE — ED Provider Notes (Signed)
[facility] EMERGENCY DEPARTMENT Provider Note   CSN: [id number] Arrival date & time: 06/29/17  1003     History   Chief Complaint Chief Complaint  Patient presents with  . Shortness of Breath    HPI MICKEL SCHREUR is a 83 y.o. male.  Pt complains of sob.  Worse lying down.   The history is provided by the patient. No language interpreter was used.  Shortness of Breath  This is a recurrent problem. The problem occurs frequently.The current episode started more than 2 days ago. The problem has not changed since onset.Pertinent negatives include no fever, no headaches, no cough, no chest pain, no abdominal pain and no rash. It is unknown what precipitated the problem. He has tried nothing for the symptoms. The treatment provided no relief. He has had prior hospitalizations. Associated medical issues include heart failure. Associated medical issues do not include asthma.    Past Medical History:  Diagnosis Date  . Atrial fibrillation ([place])   . Chronic lymphocytic leukemia ([place])   . Coronary atherosclerosis of native coronary artery    Nonobstructive  . Essential hypertension, benign   . Hyperlipidemia   . IgA nephropathy    Dr. Lowanda Foster  . Type 2 diabetes mellitus ([place])   . UTI (urinary tract infection) july  10 th   taking  med     Patient Active Problem List   Diagnosis Date Noted  . Essential hypertension 09/10/2016  . Rt Heel Osteomyelitis ([place]) 09/08/2016  . Cellulitis of heel, right 09/08/2016  . Hypoglycemia 06/21/2016  . Type 2 diabetes mellitus ([place])   . Chronic lymphocytic leukemia ([place])   . Encounter for therapeutic drug monitoring 05/16/2013  . Cardiac murmur 10/21/2011  . Long term current use of anticoagulant therapy 06/27/2010  . Mixed hyperlipidemia 05/21/2008  . CORONARY ATHEROSCLEROSIS NATIVE CORONARY ARTERY 05/21/2008  . Atrial fibrillation ([place]) 05/21/2008  . KNEE PAIN 06/14/2007    Past Surgical History:  Procedure Laterality Date  .  AMPUTATION Left 09/15/2012   Procedure: PARTIAL AMPUTATION 3rd TOE LEFT FOOT;  Surgeon: Marcheta Grammes, DPM;  Location: [facility];  Service: Orthopedics;  Laterality: Left;  . AMPUTATION Left 04/20/2013   Procedure: PARTIAL AMPUTATION 2ND TOE LEFT FOOT;  Surgeon: Marcheta Grammes, DPM;  Location: [facility];  Service: Orthopedics;  Laterality: Left;  . ANAL FISSURE REPAIR    . APPLICATION OF WOUND VAC Right 09/09/2016   Procedure: APPLICATION OF WOUND VAC;  Surgeon: Caprice Beaver, DPM;  Location: [facility];  Service: Podiatry;  Laterality: Right;  . CATARACT EXTRACTION W/PHACO Right 09/30/2015   Procedure: CATARACT EXTRACTION PHACO AND INTRAOCULAR LENS PLACEMENT (IOC);  Surgeon: Tonny Branch, MD;  Location: [facility];  Service: Ophthalmology;  Laterality: Right;  CDE: 11.78  . CATARACT EXTRACTION W/PHACO Left 10/31/2015   Procedure: CATARACT EXTRACTION PHACO AND INTRAOCULAR LENS PLACEMENT LEFT EYE; CDE:  9.10;  Surgeon: Tonny Branch, MD;  Location: [facility];  Service: Ophthalmology;  Laterality: Left;  . COLONOSCOPY  08/19/2011   Procedure: COLONOSCOPY;  Surgeon: Rogene Houston, MD;  [facility];  Service: Endoscopy;  Laterality: N/A;  930  . COLONOSCOPY N/A 09/19/2014   Procedure: COLONOSCOPY;  Surgeon: Rogene Houston, MD;  [facility];  Service: Endoscopy;  Laterality: N/A;  1055  . FEMORAL HERNIA REPAIR    . INCISION AND DRAINAGE Right 09/09/2016   Procedure: DEBRIDEMENT WOUND RT heel with wound vac attachment;  Surgeon: Caprice Beaver, DPM;  Location: [facility];  Service: Podiatry;  Laterality: Right;  . INCISION AND DRAINAGE OF WOUND Right 08/12/2016   Procedure: DEBRIDEMENT WOUND RIGHT HEEL;  Surgeon: Caprice Beaver, DPM;  Location: [facility];  Service: Podiatry;  Laterality: Right;  right heel  . Open repair left quadriceps tendon.  08/22/07   Dr. Aline Brochure  . Right total knee replacement    . TRANSURETHRAL RESECTION OF PROSTATE    . WOUND EXPLORATION Right 08/12/2016    Procedure: EXPLORATION OF WOUND FOR FOREIGN BODY RIGHT HEEL;  Surgeon: Caprice Beaver, DPM;  Location: [facility];  Service: Podiatry;  Laterality: Right;  right heel        Home Medications    Prior to Admission medications   Medication Sig Start Date End Date Taking? Authorizing Provider  Ascorbic Acid (VITAMIN C) 1000 MG tablet Take 1,000 mg by mouth daily with breakfast.    Yes [provider]  atorvastatin (LIPITOR) 10 MG tablet Take 10 mg by mouth every evening.   Yes [provider]  Calcium Carb-Cholecalciferol (CALCIUM 600+D) 600-800 MG-UNIT TABS Take 2 tablets by mouth daily with breakfast.   Yes [provider]  cholecalciferol (VITAMIN D) 1000 units tablet Take 1,000 Units by mouth every morning.   Yes [provider]  Coenzyme Q10 (CO Q-10) 100 MG CAPS Take 100 mg by mouth daily with breakfast.    Yes [provider]  digoxin (LANOXIN) 0.125 MG tablet Take 0.125 mg by mouth daily with breakfast.    Yes [provider]  finasteride (PROSCAR) 5 MG tablet Take 5 mg by mouth daily.   Yes [provider]  Fish Oil-Cholecalciferol (FISH OIL + D3) 1200-1000 MG-UNIT CAPS Take 2 capsules by mouth 2 (two) times daily.   Yes [provider]  furosemide (LASIX) 20 MG tablet Take 20 mg by mouth daily.   Yes [provider]  glimepiride (AMARYL) 2 MG tablet Take 2 mg by mouth daily with breakfast.  06/11/16  Yes [provider]  insulin aspart (NOVOLOG FLEXPEN) 100 UNIT/ML FlexPen Pt uses per sliding scale before lunch and dinner.   Yes [provider]  Insulin Detemir (LEVEMIR FLEXPEN) 100 UNIT/ML Pen Inject 20 Units into the skin every morning.   Yes [provider]  lisinopril (PRINIVIL,ZESTRIL) 20 MG tablet Take 40 mg by mouth daily with supper. 07/07/16  Yes [provider]  LORazepam (ATIVAN) 1 MG tablet Take 1 tablet by mouth at bedtime.  04/28/17  Yes [provider]    Multiple Vitamin (MULTIVITAMIN WITH MINERALS) TABS tablet Take 1 tablet by mouth daily with breakfast.   Yes [provider]  pioglitazone (ACTOS) 15 MG tablet Take 15 mg by mouth every evening.    Yes [provider]  VICTOZA 18 MG/3ML SOPN Inject 1.8 mg into the skin daily with breakfast. 06/05/16  Yes [provider]  vitamin E 400 UNIT capsule Take 400 Units by mouth daily with breakfast.    Yes [provider]  warfarin (COUMADIN) 5 MG tablet Take 1 tablet daily except 1 1/2 tablets on Mondays, Wednesdays and Fridays Patient taking differently: Take 5 mg by mouth daily at 6 PM. Take 1 tablet daily except 1 1/2 tablets on Mondays, Wednesdays and Fridays 04/15/17  Yes Satira Sark, MD  furosemide (LASIX) 20 MG tablet Take 2 pills a day 06/29/17   Milton Ferguson, MD    Family History Family History  Problem Relation Age of Onset  . Atrial fibrillation Mother  Social History Social History   Tobacco Use  . Smoking status: Former Smoker    Years: 10.00    Types: Cigarettes    Last attempt to quit: 04/07/1963    Years since quitting: 54.2  . Smokeless tobacco: Never Used  Substance Use Topics  . Alcohol use: No    Alcohol/week: 0.0 oz  . Drug use: No     Allergies   Erythromycin and Penicillins   Review of Systems Review of Systems  Constitutional: Negative for appetite change, fatigue and fever.  HENT: Negative for congestion, ear discharge and sinus pressure.   Eyes: Negative for discharge.  Respiratory: Positive for shortness of breath. Negative for cough.   Cardiovascular: Negative for chest pain.  Gastrointestinal: Negative for abdominal pain and diarrhea.  Genitourinary: Negative for frequency and hematuria.  Musculoskeletal: Negative for back pain.  Skin: Negative for rash.  Neurological: Negative for seizures and headaches.  Psychiatric/Behavioral: Negative for hallucinations.     Physical Exam Updated Vital  Signs BP (!) 138/57   Pulse 71   Temp 98.3 F (36.8 C) (Oral)   Resp (!) 22   Ht 6' (1.829 m)   Wt 131.5 kg (290 lb)   SpO2 98%   BMI 39.33 kg/m   Physical Exam  Constitutional: He is oriented to person, place, and time. He appears well-developed.  HENT:  Head: Normocephalic.  Eyes: Conjunctivae and EOM are normal. No scleral icterus.  Neck: Neck supple. No thyromegaly present.  Cardiovascular: Normal rate and regular rhythm. Exam reveals no gallop and no friction rub.  No murmur heard. Pulmonary/Chest: No stridor. He has no wheezes. He has no rales. He exhibits no tenderness.  Abdominal: He exhibits no distension. There is no tenderness. There is no rebound.  Musculoskeletal: Normal range of motion. He exhibits edema.  2 plus edema in legs  Lymphadenopathy:    He has no cervical adenopathy.  Neurological: He is oriented to person, place, and time. He exhibits normal muscle tone. Coordination normal.  Skin: No rash noted. No erythema.  Psychiatric: He has a normal mood and affect. His behavior is normal.     ED Treatments / Results  Labs (all labs ordered are listed, but only abnormal results are displayed) Labs Reviewed  CBC WITH DIFFERENTIAL/PLATELET - Abnormal; Notable for the following components:      Result Value   WBC 13.6 (*)    Hemoglobin 12.9 (*)    Platelets 141 (*)    Lymphs Abs 7.3 (*)    Monocytes Absolute 1.1 (*)    All other components within normal limits  COMPREHENSIVE METABOLIC PANEL - Abnormal; Notable for the following components:   Glucose, Bld 145 (*)    BUN 21 (*)    Total Protein 6.1 (*)    Albumin 3.3 (*)    All other components within normal limits  TROPONIN I - Abnormal; Notable for the following components:   Troponin I 0.03 (*)    All other components within normal limits  BRAIN NATRIURETIC PEPTIDE - Abnormal; Notable for the following components:   B Natriuretic Peptide 179.0 (*)    All other components within normal limits   PROTIME-INR - Abnormal; Notable for the following components:   Prothrombin Time 25.0 (*)    All other components within normal limits  DIGOXIN LEVEL - Abnormal; Notable for the following components:   Digoxin Level 0.6 (*)    All other components within normal limits    EKG EKG Interpretation  Date/Time:  Tuesday June 29 2017 13:55:21 EDT Ventricular Rate:  69 PR Interval:    QRS Duration: 141 QT Interval:  448 QTC Calculation: 480 R Axis:   -64 Text Interpretation:  Atrial fibrillation RBBB and LAFB Confirmed by Milton Ferguson [phone]) on 06/29/2017 2:02:18 PM   Radiology Dg Chest 2 View  Result Date: 06/29/2017 CLINICAL DATA:  Cold symptoms for 3 days. EXAM: CHEST - 2 VIEW COMPARISON:  06/20/2016. FINDINGS: Cardiomegaly. Calcified tortuous aorta. Coarsened interstitial markings could represent chronic bronchitis, or viral pneumonitis. No lobar consolidation. Slight blunting both CP angles, probably chronic. No acute bony abnormality. IMPRESSION: Overall improved aeration compared with priors. Coarsened interstitial markings of uncertain significance. Electronically Signed   By: Staci Righter M.D.   On: 06/29/2017 10:59    Procedures Procedures (including critical care time)  Medications Ordered in ED Medications  furosemide (LASIX) injection 40 mg (40 mg Intravenous Given 06/29/17 1540)     Initial Impression / Assessment and Plan / ED Course  I have reviewed the triage vital signs and the nursing notes.  Pertinent labs & imaging results that were available during my care of the patient were reviewed by me and considered in my medical decision making (see chart for details).     I spoke with cardiology and we will increase his lasix to 40mg  a day and cardiology will follow up soon  Final Clinical Impressions(s) / ED Diagnoses   Final diagnoses:  [place]    ED Discharge Orders        Ordered    furosemide (LASIX) 20 MG tablet     06/29/17 1648        Milton Ferguson, MD 06/29/17 1655

## 2017-06-29 NOTE — ED Notes (Signed)
EDP at bedside  

## 2017-07-02 DIAGNOSIS — L851 Acquired keratosis [keratoderma] palmaris et plantaris: Secondary | ICD-10-CM | POA: Diagnosis not present

## 2017-07-02 DIAGNOSIS — B351 Tinea unguium: Secondary | ICD-10-CM | POA: Diagnosis not present

## 2017-07-02 DIAGNOSIS — E1142 Type 2 diabetes mellitus with diabetic polyneuropathy: Secondary | ICD-10-CM | POA: Diagnosis not present

## 2017-07-05 DIAGNOSIS — E119 Type 2 diabetes mellitus without complications: Secondary | ICD-10-CM | POA: Diagnosis not present

## 2017-07-05 DIAGNOSIS — H26493 Other secondary cataract, bilateral: Secondary | ICD-10-CM | POA: Diagnosis not present

## 2017-07-05 DIAGNOSIS — Z961 Presence of intraocular lens: Secondary | ICD-10-CM | POA: Diagnosis not present

## 2017-07-19 DIAGNOSIS — Z6841 Body Mass Index (BMI) 40.0 and over, adult: Secondary | ICD-10-CM | POA: Diagnosis not present

## 2017-07-19 DIAGNOSIS — R600 Localized edema: Secondary | ICD-10-CM | POA: Diagnosis not present

## 2017-07-19 DIAGNOSIS — Z87891 Personal history of nicotine dependence: Secondary | ICD-10-CM | POA: Diagnosis not present

## 2017-07-19 DIAGNOSIS — E1165 Type 2 diabetes mellitus with hyperglycemia: Secondary | ICD-10-CM | POA: Diagnosis not present

## 2017-07-19 DIAGNOSIS — E1142 Type 2 diabetes mellitus with diabetic polyneuropathy: Secondary | ICD-10-CM | POA: Diagnosis not present

## 2017-07-19 DIAGNOSIS — I8310 Varicose veins of unspecified lower extremity with inflammation: Secondary | ICD-10-CM | POA: Diagnosis not present

## 2017-07-19 DIAGNOSIS — C9111 Chronic lymphocytic leukemia of B-cell type in remission: Secondary | ICD-10-CM | POA: Diagnosis not present

## 2017-07-19 DIAGNOSIS — I4891 Unspecified atrial fibrillation: Secondary | ICD-10-CM | POA: Diagnosis not present

## 2017-07-19 DIAGNOSIS — Z299 Encounter for prophylactic measures, unspecified: Secondary | ICD-10-CM | POA: Diagnosis not present

## 2017-07-19 DIAGNOSIS — I10 Essential (primary) hypertension: Secondary | ICD-10-CM | POA: Diagnosis not present

## 2017-07-20 ENCOUNTER — Ambulatory Visit (INDEPENDENT_AMBULATORY_CARE_PROVIDER_SITE_OTHER): Payer: Medicare Other | Admitting: *Deleted

## 2017-07-20 DIAGNOSIS — I4891 Unspecified atrial fibrillation: Secondary | ICD-10-CM | POA: Diagnosis not present

## 2017-07-20 DIAGNOSIS — Z5181 Encounter for therapeutic drug level monitoring: Secondary | ICD-10-CM

## 2017-07-20 DIAGNOSIS — Z7901 Long term (current) use of anticoagulants: Secondary | ICD-10-CM

## 2017-07-20 LAB — POCT INR: INR: 1.7

## 2017-07-20 NOTE — Patient Instructions (Signed)
Take coumadin 2 tablets tonight then resume 1 tablet daily except 1 1/2 tablets  on Mondays, Wednesdays and Fridays  Recheck in 3 weeks

## 2017-07-21 NOTE — Progress Notes (Signed)
Cardiology Office Note  Date: 07/22/2017   ID: EHTAN DELFAVERO, DOB [birth year], MRN [id number]  PCP: [name], MD  Primary Cardiologist: [name], MD   Chief Complaint  Patient presents with  . Atrial Fibrillation    History of Present Illness: [name] [name] is an 83 y.o. male last seen in September 2018.  He presents for a follow-up visit. He was seen in the ER in March with orthopnea.  He was seen by Dr. Roderic Palau and ultimately Lasix was increased to 40 mg daily.  Chest x-ray showed coarse interstitial markings but overall improved aeration compared to previous exam.  BNP was only mildly increased at 179.  He comes in today stating that he does feel better, although his weight has not changed significantly.  He has been taking Lasix at 40 mg daily and otherwise no major change in his cardiac regimen.  He does not report any chest pain or palpitations.  He continues on Coumadin with follow-up in the anticoagulation clinic.  His last echocardiogram was in 2013 as outlined below.  Past Medical History:  Diagnosis Date  . Atrial fibrillation ([place])   . Chronic lymphocytic leukemia ([place])   . Coronary atherosclerosis of native coronary artery    Nonobstructive  . Essential hypertension, benign   . Hyperlipidemia   . IgA nephropathy    Dr. Lowanda Foster  . Type 2 diabetes mellitus ([place])   . UTI (urinary tract infection) july  10 th   taking  med     Past Surgical History:  Procedure Laterality Date  . AMPUTATION Left 09/15/2012   Procedure: PARTIAL AMPUTATION 3rd TOE LEFT FOOT;  Surgeon: Marcheta Grammes, DPM;  Location: [facility];  Service: Orthopedics;  Laterality: Left;  . AMPUTATION Left 04/20/2013   Procedure: PARTIAL AMPUTATION 2ND TOE LEFT FOOT;  Surgeon: Marcheta Grammes, DPM;  Location: [facility];  Service: Orthopedics;  Laterality: Left;  . ANAL FISSURE REPAIR    . APPLICATION OF WOUND VAC Right 09/09/2016   Procedure: APPLICATION OF WOUND VAC;  Surgeon:  Caprice Beaver, DPM;  Location: [facility];  Service: Podiatry;  Laterality: Right;  . CATARACT EXTRACTION W/PHACO Right 09/30/2015   Procedure: CATARACT EXTRACTION PHACO AND INTRAOCULAR LENS PLACEMENT (IOC);  Surgeon: Tonny Branch, MD;  Location: [facility];  Service: Ophthalmology;  Laterality: Right;  CDE: 11.78  . CATARACT EXTRACTION W/PHACO Left 10/31/2015   Procedure: CATARACT EXTRACTION PHACO AND INTRAOCULAR LENS PLACEMENT LEFT EYE; CDE:  9.10;  Surgeon: Tonny Branch, MD;  Location: [facility];  Service: Ophthalmology;  Laterality: Left;  . COLONOSCOPY  08/19/2011   Procedure: COLONOSCOPY;  Surgeon: Rogene Houston, MD;  Location: [facility];  Service: Endoscopy;  Laterality: N/A;  930  . COLONOSCOPY N/A 09/19/2014   Procedure: COLONOSCOPY;  Surgeon: Rogene Houston, MD;  [facility];  Service: Endoscopy;  Laterality: N/A;  1055  . FEMORAL HERNIA REPAIR    . INCISION AND DRAINAGE Right 09/09/2016   Procedure: DEBRIDEMENT WOUND RT heel with wound vac attachment;  Surgeon: Caprice Beaver, DPM;  Location: [facility];  Service: Podiatry;  Laterality: Right;  . INCISION AND DRAINAGE OF WOUND Right 08/12/2016   Procedure: DEBRIDEMENT WOUND RIGHT HEEL;  Surgeon: Caprice Beaver, DPM;  Location: [facility];  Service: Podiatry;  Laterality: Right;  right heel  . Open repair left quadriceps tendon.  08/22/07   Dr. Aline Brochure  . Right total knee replacement    . TRANSURETHRAL RESECTION OF PROSTATE    .  WOUND EXPLORATION Right 08/12/2016   Procedure: EXPLORATION OF WOUND FOR FOREIGN BODY RIGHT HEEL;  Surgeon: Caprice Beaver, DPM;  Location: [facility];  Service: Podiatry;  Laterality: Right;  right heel    Current Outpatient Medications  Medication Sig Dispense Refill  . Ascorbic Acid (VITAMIN C) 1000 MG tablet Take 1,000 mg by mouth daily with breakfast.     . atorvastatin (LIPITOR) 10 MG tablet Take 10 mg by mouth every evening.    . Calcium Carb-Cholecalciferol (CALCIUM 600+D) 600-800 MG-UNIT TABS Take  2 tablets by mouth daily with breakfast.    . cholecalciferol (VITAMIN D) 1000 units tablet Take 1,000 Units by mouth every morning.    . Coenzyme Q10 (CO Q-10) 100 MG CAPS Take 100 mg by mouth daily with breakfast.     . digoxin (LANOXIN) 0.125 MG tablet Take 0.125 mg by mouth daily with breakfast.     . finasteride (PROSCAR) 5 MG tablet Take 5 mg by mouth daily.    . Fish Oil-Cholecalciferol (FISH OIL + D3) 1200-1000 MG-UNIT CAPS Take 2 capsules by mouth 2 (two) times daily.    . furosemide (LASIX) 20 MG tablet Take 20 mg by mouth daily.    . furosemide (LASIX) 20 MG tablet Take 2 pills a day 20 tablet 0  . glimepiride (AMARYL) 2 MG tablet Take 2 mg by mouth daily with breakfast.     . insulin aspart (NOVOLOG FLEXPEN) 100 UNIT/ML FlexPen Pt uses per sliding scale before lunch and dinner.    . Insulin Detemir (LEVEMIR FLEXPEN) 100 UNIT/ML Pen Inject 20 Units into the skin every morning.    Marland Kitchen lisinopril (PRINIVIL,ZESTRIL) 20 MG tablet Take 40 mg by mouth daily with supper.    Marland Kitchen LORazepam (ATIVAN) 1 MG tablet Take 1 tablet by mouth at bedtime.     . Multiple Vitamin (MULTIVITAMIN WITH MINERALS) TABS tablet Take 1 tablet by mouth daily with breakfast.    . pioglitazone (ACTOS) 15 MG tablet Take 15 mg by mouth every evening.     Marland Kitchen VICTOZA 18 MG/3ML SOPN Inject 1.8 mg into the skin daily with breakfast.    . vitamin E 400 UNIT capsule Take 400 Units by mouth daily with breakfast.     . warfarin (COUMADIN) 5 MG tablet Take 1 tablet daily except 1 1/2 tablets on Mondays, Wednesdays and Fridays (Patient taking differently: Take 5 mg by mouth daily at 6 PM. Take 1 tablet daily except 1 1/2 tablets on Mondays, Wednesdays and Fridays) 120 tablet 3   No current facility-administered medications for this visit.    Allergies:  Erythromycin and Penicillins   Social History: The patient  reports that he quit smoking about 54 years ago. His smoking use included cigarettes. He quit after 10.00 years of use. He  has never used smokeless tobacco. He reports that he does not drink alcohol or use drugs.   ROS:  Please see the history of present illness. Otherwise, complete review of systems is positive for hearing loss.  All other systems are reviewed and negative.   Physical Exam: VS:  BP (!) 136/56   Pulse 84   Ht 6' (1.829 m)   Wt 292 lb (132.5 kg)   SpO2 98%   BMI 39.60 kg/m , BMI Body mass index is 39.6 kg/m.  Wt Readings from Last 3 Encounters:  07/22/17 292 lb (132.5 kg)  06/29/17 290 lb (131.5 kg)  12/08/16 289 lb 12.8 oz (131.5 kg)    General: Patient appears  comfortable at rest. HEENT: Conjunctiva and lids normal, oropharynx clear. Neck: Supple, no elevated JVP or carotid bruits, no thyromegaly. Lungs: Clear to auscultation, nonlabored breathing at rest. Cardiac: Irregularly irregular, no S3, 2/6 systolic murmur, no pericardial rub. Abdomen: Soft, nontender, bowel sounds present. Extremities: Mild ankle edema, distal pulses 2+. Skin: Warm and dry. Musculoskeletal: No kyphosis. Neuropsychiatric: Alert and oriented x3, affect grossly appropriate.  ECG: I personally reviewed the tracing from 06/30/2017 which showed atrial fibrillation with right bundle branch block and left anterior fascicular block.  Recent Labwork: 06/29/2017: ALT 47; AST 32; B Natriuretic Peptide 179.0; BUN 21; Creatinine, Ser 0.76; Hemoglobin 12.9; Platelets 141; Potassium 3.6; Sodium 140     Component Value Date/Time   CHOL 124 10/06/2016 0500   TRIG 150 (H) 10/06/2016 0500   HDL 23 (L) 10/06/2016 0500   CHOLHDL 5.4 10/06/2016 0500   VLDL 30 10/06/2016 0500   LDLCALC 71 10/06/2016 0500    Other Studies Reviewed Today:  Echocardiogram July 2013: Mild to moderate LVH with LVEF greater than 65%, moderate left atrial enlargement, mild mitral regurgitation, trace aortic regurgitation, mildly dilated right ventricle and moderately dilated right atrium, RVSP 40 mmHg.  Assessment and Plan:  1.  Recent  suspected acute on chronic diastolic heart failure.  He feels symptomatically better on Lasix 40 mg daily.  Plan is to obtain a follow-up echocardiogram in comparison to his previous study in 2013.  He did have mild right ventricular dysfunction at that point as well.  2.  Chronic atrial fibrillation, heart rate adequately controlled on Lanoxin.  Continue Coumadin for stroke prophylaxis.  3.  History of nonobstructive CAD.  No active angina symptoms.   4.  Essential hypertension, heart rate control is adequate today.  Current medicines were reviewed with the patient today.   Orders Placed This Encounter  Procedures  . ECHOCARDIOGRAM COMPLETE    Disposition: Follow-up in 6 months.  Signed, Satira Sark, MD, [facility] 07/22/2017 2:40 PM    [facility] [place], [place], [place] [address] Phone: [phone]; Fax: [phone]

## 2017-07-22 ENCOUNTER — Encounter: Payer: Self-pay | Admitting: Cardiology

## 2017-07-22 ENCOUNTER — Ambulatory Visit (INDEPENDENT_AMBULATORY_CARE_PROVIDER_SITE_OTHER): Payer: Medicare Other | Admitting: Cardiology

## 2017-07-22 VITALS — BP 136/56 | HR 84 | Ht 72.0 in | Wt 292.0 lb

## 2017-07-22 DIAGNOSIS — I2510 Atherosclerotic heart disease of native coronary artery without angina pectoris: Secondary | ICD-10-CM | POA: Diagnosis not present

## 2017-07-22 DIAGNOSIS — I10 Essential (primary) hypertension: Secondary | ICD-10-CM | POA: Diagnosis not present

## 2017-07-22 DIAGNOSIS — I482 Chronic atrial fibrillation, unspecified: Secondary | ICD-10-CM

## 2017-07-22 DIAGNOSIS — I5033 Acute on chronic diastolic (congestive) heart failure: Secondary | ICD-10-CM

## 2017-07-22 NOTE — Patient Instructions (Signed)

## 2017-08-04 ENCOUNTER — Other Ambulatory Visit: Payer: Self-pay

## 2017-08-04 ENCOUNTER — Ambulatory Visit (INDEPENDENT_AMBULATORY_CARE_PROVIDER_SITE_OTHER): Payer: Medicare Other

## 2017-08-04 DIAGNOSIS — I482 Chronic atrial fibrillation, unspecified: Secondary | ICD-10-CM

## 2017-08-05 ENCOUNTER — Telehealth: Payer: Self-pay

## 2017-08-05 NOTE — Telephone Encounter (Signed)
-----   Message from Merlene Laughter, LPN sent at 07/11/1853 12:59 PM EDT -----   ----- Message ----- From: Satira Sark, MD Sent: 08/05/2017  12:52 PM To: Merlene Laughter, LPN  Results reviewed.  Stable LVEF is 60 to 01%, diastolic function was indeterminate.  Plan will be to continue Lasix as discussed at recent office visit. A copy of this test should be forwarded to [name], MD.

## 2017-08-05 NOTE — Telephone Encounter (Signed)
Patient notified. Routed to PCP 

## 2017-08-10 ENCOUNTER — Ambulatory Visit (INDEPENDENT_AMBULATORY_CARE_PROVIDER_SITE_OTHER): Payer: Medicare Other | Admitting: *Deleted

## 2017-08-10 DIAGNOSIS — I4891 Unspecified atrial fibrillation: Secondary | ICD-10-CM | POA: Diagnosis not present

## 2017-08-10 DIAGNOSIS — Z5181 Encounter for therapeutic drug level monitoring: Secondary | ICD-10-CM | POA: Diagnosis not present

## 2017-08-10 DIAGNOSIS — Z7901 Long term (current) use of anticoagulants: Secondary | ICD-10-CM | POA: Diagnosis not present

## 2017-08-10 LAB — POCT INR: INR: 1.5

## 2017-08-10 MED ORDER — WARFARIN SODIUM 5 MG PO TABS: ORAL_TABLET | ORAL | 3 refills | Status: DC

## 2017-08-10 NOTE — Patient Instructions (Signed)
Increase coumadin to 1 1/2 tablets daily  Recheck in 2 weeks

## 2017-08-17 DIAGNOSIS — C9191 Lymphoid leukemia, unspecified, in remission: Secondary | ICD-10-CM | POA: Diagnosis not present

## 2017-08-17 DIAGNOSIS — E1165 Type 2 diabetes mellitus with hyperglycemia: Secondary | ICD-10-CM | POA: Diagnosis not present

## 2017-08-17 DIAGNOSIS — Z6841 Body Mass Index (BMI) 40.0 and over, adult: Secondary | ICD-10-CM | POA: Diagnosis not present

## 2017-08-17 DIAGNOSIS — E1142 Type 2 diabetes mellitus with diabetic polyneuropathy: Secondary | ICD-10-CM | POA: Diagnosis not present

## 2017-08-17 DIAGNOSIS — Z299 Encounter for prophylactic measures, unspecified: Secondary | ICD-10-CM | POA: Diagnosis not present

## 2017-08-17 DIAGNOSIS — I4891 Unspecified atrial fibrillation: Secondary | ICD-10-CM | POA: Diagnosis not present

## 2017-08-18 DIAGNOSIS — R350 Frequency of micturition: Secondary | ICD-10-CM | POA: Diagnosis not present

## 2017-08-18 DIAGNOSIS — N401 Enlarged prostate with lower urinary tract symptoms: Secondary | ICD-10-CM | POA: Diagnosis not present

## 2017-08-19 ENCOUNTER — Other Ambulatory Visit: Payer: Medicare Other

## 2017-08-24 ENCOUNTER — Ambulatory Visit (INDEPENDENT_AMBULATORY_CARE_PROVIDER_SITE_OTHER): Payer: Medicare Other | Admitting: *Deleted

## 2017-08-24 DIAGNOSIS — Z7901 Long term (current) use of anticoagulants: Secondary | ICD-10-CM | POA: Diagnosis not present

## 2017-08-24 DIAGNOSIS — Z5181 Encounter for therapeutic drug level monitoring: Secondary | ICD-10-CM | POA: Diagnosis not present

## 2017-08-24 DIAGNOSIS — I4891 Unspecified atrial fibrillation: Secondary | ICD-10-CM

## 2017-08-24 LAB — POCT INR: INR: 3.3 — AB (ref 2.0–3.0)

## 2017-08-24 NOTE — Patient Instructions (Signed)
Decrease coumadin to 1 1/2 tablets daily except 1 tablet on Tuesdays and Fridays Recheck in 3 weeks

## 2017-09-02 DIAGNOSIS — I10 Essential (primary) hypertension: Secondary | ICD-10-CM | POA: Diagnosis not present

## 2017-09-02 DIAGNOSIS — I4891 Unspecified atrial fibrillation: Secondary | ICD-10-CM | POA: Diagnosis not present

## 2017-09-02 DIAGNOSIS — E119 Type 2 diabetes mellitus without complications: Secondary | ICD-10-CM | POA: Diagnosis not present

## 2017-09-09 ENCOUNTER — Ambulatory Visit (INDEPENDENT_AMBULATORY_CARE_PROVIDER_SITE_OTHER): Payer: Medicare Other | Admitting: Otolaryngology

## 2017-09-09 DIAGNOSIS — H903 Sensorineural hearing loss, bilateral: Secondary | ICD-10-CM

## 2017-09-09 DIAGNOSIS — H6121 Impacted cerumen, right ear: Secondary | ICD-10-CM

## 2017-09-09 DIAGNOSIS — H9201 Otalgia, right ear: Secondary | ICD-10-CM | POA: Diagnosis not present

## 2017-09-10 DIAGNOSIS — B351 Tinea unguium: Secondary | ICD-10-CM | POA: Diagnosis not present

## 2017-09-10 DIAGNOSIS — L851 Acquired keratosis [keratoderma] palmaris et plantaris: Secondary | ICD-10-CM | POA: Diagnosis not present

## 2017-09-10 DIAGNOSIS — E1142 Type 2 diabetes mellitus with diabetic polyneuropathy: Secondary | ICD-10-CM | POA: Diagnosis not present

## 2017-09-14 ENCOUNTER — Ambulatory Visit (INDEPENDENT_AMBULATORY_CARE_PROVIDER_SITE_OTHER): Payer: Medicare Other | Admitting: *Deleted

## 2017-09-14 DIAGNOSIS — Z5181 Encounter for therapeutic drug level monitoring: Secondary | ICD-10-CM | POA: Diagnosis not present

## 2017-09-14 DIAGNOSIS — I4891 Unspecified atrial fibrillation: Secondary | ICD-10-CM | POA: Diagnosis not present

## 2017-09-14 DIAGNOSIS — Z7901 Long term (current) use of anticoagulants: Secondary | ICD-10-CM | POA: Diagnosis not present

## 2017-09-14 LAB — POCT INR: INR: 2.6 (ref 2.0–3.0)

## 2017-09-14 NOTE — Patient Instructions (Signed)
Continue coumadin 1 1/2 tablets daily except 1 tablet on Tuesdays and Fridays Recheck in 4 weeks 

## 2017-09-16 ENCOUNTER — Ambulatory Visit (INDEPENDENT_AMBULATORY_CARE_PROVIDER_SITE_OTHER): Payer: Medicare Other | Admitting: Otolaryngology

## 2017-09-16 DIAGNOSIS — H6001 Abscess of right external ear: Secondary | ICD-10-CM | POA: Diagnosis not present

## 2017-09-23 ENCOUNTER — Other Ambulatory Visit: Payer: Self-pay | Admitting: Otolaryngology

## 2017-09-23 ENCOUNTER — Ambulatory Visit (INDEPENDENT_AMBULATORY_CARE_PROVIDER_SITE_OTHER): Payer: Medicare Other | Admitting: Otolaryngology

## 2017-09-23 DIAGNOSIS — H60331 Swimmer's ear, right ear: Secondary | ICD-10-CM | POA: Diagnosis not present

## 2017-09-24 ENCOUNTER — Other Ambulatory Visit: Payer: Self-pay

## 2017-09-24 ENCOUNTER — Encounter (HOSPITAL_BASED_OUTPATIENT_CLINIC_OR_DEPARTMENT_OTHER): Payer: Self-pay | Admitting: *Deleted

## 2017-09-27 ENCOUNTER — Encounter (HOSPITAL_BASED_OUTPATIENT_CLINIC_OR_DEPARTMENT_OTHER): Payer: Medicare Other

## 2017-09-27 ENCOUNTER — Encounter (HOSPITAL_BASED_OUTPATIENT_CLINIC_OR_DEPARTMENT_OTHER): Payer: Self-pay

## 2017-09-27 ENCOUNTER — Telehealth: Payer: Self-pay | Admitting: *Deleted

## 2017-09-27 ENCOUNTER — Other Ambulatory Visit: Payer: Self-pay | Admitting: Otolaryngology

## 2017-09-27 DIAGNOSIS — Z87891 Personal history of nicotine dependence: Secondary | ICD-10-CM | POA: Diagnosis not present

## 2017-09-27 DIAGNOSIS — I2510 Atherosclerotic heart disease of native coronary artery without angina pectoris: Secondary | ICD-10-CM | POA: Diagnosis not present

## 2017-09-27 DIAGNOSIS — E119 Type 2 diabetes mellitus without complications: Secondary | ICD-10-CM | POA: Diagnosis not present

## 2017-09-27 DIAGNOSIS — I10 Essential (primary) hypertension: Secondary | ICD-10-CM | POA: Diagnosis not present

## 2017-09-27 DIAGNOSIS — Z79899 Other long term (current) drug therapy: Secondary | ICD-10-CM | POA: Diagnosis not present

## 2017-09-27 DIAGNOSIS — I4891 Unspecified atrial fibrillation: Secondary | ICD-10-CM | POA: Diagnosis not present

## 2017-09-27 DIAGNOSIS — Z794 Long term (current) use of insulin: Secondary | ICD-10-CM | POA: Diagnosis not present

## 2017-09-27 DIAGNOSIS — Z7901 Long term (current) use of anticoagulants: Secondary | ICD-10-CM | POA: Diagnosis not present

## 2017-09-27 DIAGNOSIS — H938X1 Other specified disorders of right ear: Secondary | ICD-10-CM | POA: Diagnosis not present

## 2017-09-27 LAB — BASIC METABOLIC PANEL
Anion gap: 9 (ref 5–15)
BUN: 20 mg/dL (ref 6–20)
CALCIUM: 9.6 mg/dL (ref 8.9–10.3)
CO2: 27 mmol/L (ref 22–32)
Chloride: 100 mmol/L — ABNORMAL LOW (ref 101–111)
Creatinine, Ser: 0.88 mg/dL (ref 0.61–1.24)
Glucose, Bld: 261 mg/dL — ABNORMAL HIGH (ref 65–99)
Potassium: 5.7 mmol/L — ABNORMAL HIGH (ref 3.5–5.1)
Sodium: 136 mmol/L (ref 135–145)

## 2017-09-27 LAB — PROTIME-INR
INR: 1.23
Prothrombin Time: 15.4 seconds — ABNORMAL HIGH (ref 11.4–15.2)

## 2017-09-27 NOTE — Telephone Encounter (Signed)
Need to verify if stopped warfarin prior to procedure. Unable to leave message on number provided.

## 2017-09-27 NOTE — Telephone Encounter (Signed)
Request received in office from Teoh's office to hold coumadin 4 days prior to surgery that is scheduled for 09/28/17. Per Domenic Polite, patient may hold coumadin 4 days prior to surgery without lovenox bridging but since request was being responded to on 09/27/17 that surgery would need to be rescheduled.   Spoke with Nira Conn and informed her of the above information and she said that patient was instructed to hold coumadin 4 days prior to surgery by Dr. Benjamine Mola and that this request was needed in writing from Dr. Domenic Polite for the [place].   Information faxed to the Coumadin Clinic for further management after surgery.

## 2017-09-27 NOTE — Telephone Encounter (Signed)
Patient called office r/e his coumadin after he was contacted. Patient verified that he has not taken coumadin since Wednesday last week 09/22/17. Message sent to anticoag clinic.

## 2017-09-27 NOTE — Progress Notes (Signed)
Dr. Lissa Hoard shown labs K+ 5.7, Glucose 261, Chloride 100 and prothrombin Time 15.4 , order I stat day of surgery to check K+.

## 2017-09-27 NOTE — Telephone Encounter (Signed)
Spoke with pt wife to recommend resume warfarin evening of procedure with usual dose. Did advise to follow instructions of surgeon on when to resume, but let us know if deferred resumption as may need to adjust next check.

## 2017-09-28 ENCOUNTER — Ambulatory Visit (HOSPITAL_BASED_OUTPATIENT_CLINIC_OR_DEPARTMENT_OTHER): Payer: Medicare Other | Admitting: Certified Registered"

## 2017-09-28 ENCOUNTER — Encounter (HOSPITAL_BASED_OUTPATIENT_CLINIC_OR_DEPARTMENT_OTHER)
Admission: RE | Disposition: A | Discharge status: Home / Self Care | Payer: Self-pay | Source: Ambulatory Visit | Attending: Otolaryngology

## 2017-09-28 ENCOUNTER — Encounter (HOSPITAL_BASED_OUTPATIENT_CLINIC_OR_DEPARTMENT_OTHER): Payer: Self-pay | Admitting: *Deleted

## 2017-09-28 ENCOUNTER — Ambulatory Visit (HOSPITAL_BASED_OUTPATIENT_CLINIC_OR_DEPARTMENT_OTHER)
Admission: RE | Admit: 2017-09-28 | Discharge: 2017-09-28 | Disposition: A | Discharge status: Home / Self Care | Payer: Medicare Other | Source: Ambulatory Visit | Attending: Otolaryngology | Admitting: Otolaryngology

## 2017-09-28 ENCOUNTER — Other Ambulatory Visit: Payer: Self-pay

## 2017-09-28 DIAGNOSIS — I10 Essential (primary) hypertension: Secondary | ICD-10-CM | POA: Insufficient documentation

## 2017-09-28 DIAGNOSIS — Z794 Long term (current) use of insulin: Secondary | ICD-10-CM | POA: Diagnosis not present

## 2017-09-28 DIAGNOSIS — I4891 Unspecified atrial fibrillation: Secondary | ICD-10-CM | POA: Insufficient documentation

## 2017-09-28 DIAGNOSIS — E119 Type 2 diabetes mellitus without complications: Secondary | ICD-10-CM | POA: Diagnosis not present

## 2017-09-28 DIAGNOSIS — Z79899 Other long term (current) drug therapy: Secondary | ICD-10-CM | POA: Diagnosis not present

## 2017-09-28 DIAGNOSIS — E782 Mixed hyperlipidemia: Secondary | ICD-10-CM | POA: Diagnosis not present

## 2017-09-28 DIAGNOSIS — Z87891 Personal history of nicotine dependence: Secondary | ICD-10-CM | POA: Diagnosis not present

## 2017-09-28 DIAGNOSIS — Z7901 Long term (current) use of anticoagulants: Secondary | ICD-10-CM | POA: Insufficient documentation

## 2017-09-28 DIAGNOSIS — I2510 Atherosclerotic heart disease of native coronary artery without angina pectoris: Secondary | ICD-10-CM | POA: Diagnosis not present

## 2017-09-28 DIAGNOSIS — H6691 Otitis media, unspecified, right ear: Secondary | ICD-10-CM | POA: Diagnosis not present

## 2017-09-28 DIAGNOSIS — H938X1 Other specified disorders of right ear: Secondary | ICD-10-CM | POA: Insufficient documentation

## 2017-09-28 DIAGNOSIS — D2321 Other benign neoplasm of skin of right ear and external auricular canal: Secondary | ICD-10-CM | POA: Diagnosis not present

## 2017-09-28 HISTORY — PX: MASS EXCISION: SHX2000

## 2017-09-28 LAB — POCT I-STAT, CHEM 8
BUN: 20 mg/dL (ref 8–23)
Calcium, Ion: 1.1 mmol/L — ABNORMAL LOW (ref 1.15–1.40)
Chloride: 102 mmol/L (ref 98–111)
Creatinine, Ser: 0.7 mg/dL (ref 0.61–1.24)
GLUCOSE: 199 mg/dL — AB (ref 70–99)
HEMATOCRIT: 42 % (ref 39.0–52.0)
HEMOGLOBIN: 14.3 g/dL (ref 13.0–17.0)
POTASSIUM: 3.7 mmol/L (ref 3.5–5.1)
Sodium: 139 mmol/L (ref 135–145)
TCO2: 24 mmol/L (ref 22–32)

## 2017-09-28 LAB — GLUCOSE, CAPILLARY: GLUCOSE-CAPILLARY: 194 mg/dL — AB (ref 70–99)

## 2017-09-28 SURGERY — EXCISION MASS
Anesthesia: General | Laterality: Right

## 2017-09-28 MED ORDER — CLINDAMYCIN PHOSPHATE 900 MG/50ML IV SOLN
INTRAVENOUS | Status: DC | PRN
  Administered 2017-09-28: 900 mg via INTRAVENOUS

## 2017-09-28 MED ORDER — LIDOCAINE HCL (CARDIAC) PF 100 MG/5ML IV SOSY
PREFILLED_SYRINGE | INTRAVENOUS | Status: AC
  Filled 2017-09-28: qty 5

## 2017-09-28 MED ORDER — FENTANYL CITRATE (PF) 100 MCG/2ML IJ SOLN
INTRAMUSCULAR | Status: AC
  Filled 2017-09-28: qty 2

## 2017-09-28 MED ORDER — ONDANSETRON HCL 4 MG/2ML IJ SOLN
INTRAMUSCULAR | Status: AC
  Filled 2017-09-28: qty 2

## 2017-09-28 MED ORDER — PROPOFOL 10 MG/ML IV BOLUS
INTRAVENOUS | Status: AC
  Filled 2017-09-28: qty 20

## 2017-09-28 MED ORDER — FENTANYL CITRATE (PF) 100 MCG/2ML IJ SOLN
50.0000 ug | INTRAMUSCULAR | Status: DC | PRN
  Administered 2017-09-28 (×2): 50 ug via INTRAVENOUS

## 2017-09-28 MED ORDER — ONDANSETRON HCL 4 MG/2ML IJ SOLN
INTRAMUSCULAR | Status: DC | PRN
  Administered 2017-09-28: 4 mg via INTRAVENOUS

## 2017-09-28 MED ORDER — OXYCODONE-ACETAMINOPHEN 5-325 MG PO TABS
ORAL_TABLET | ORAL | Status: AC
  Filled 2017-09-28: qty 1

## 2017-09-28 MED ORDER — MIDAZOLAM HCL 2 MG/2ML IJ SOLN: 1.0000 mg | INTRAMUSCULAR | Status: DC | PRN

## 2017-09-28 MED ORDER — SUGAMMADEX SODIUM 500 MG/5ML IV SOLN
INTRAVENOUS | Status: DC | PRN
  Administered 2017-09-28: 400 mg via INTRAVENOUS

## 2017-09-28 MED ORDER — FENTANYL CITRATE (PF) 100 MCG/2ML IJ SOLN
25.0000 ug | INTRAMUSCULAR | Status: DC | PRN
  Administered 2017-09-28: 25 ug via INTRAVENOUS
  Administered 2017-09-28: 50 ug via INTRAVENOUS

## 2017-09-28 MED ORDER — ROCURONIUM BROMIDE 100 MG/10ML IV SOLN
INTRAVENOUS | Status: DC | PRN
  Administered 2017-09-28: 40 mg via INTRAVENOUS

## 2017-09-28 MED ORDER — OXYCODONE HCL 5 MG PO TABS
ORAL_TABLET | ORAL | Status: AC
  Filled 2017-09-28: qty 1

## 2017-09-28 MED ORDER — SCOPOLAMINE 1 MG/3DAYS TD PT72: 1.0000 | MEDICATED_PATCH | Freq: Once | TRANSDERMAL | Status: DC | PRN

## 2017-09-28 MED ORDER — LACTATED RINGERS IV SOLN
INTRAVENOUS | Status: DC
  Administered 2017-09-28: 11:00:00 via INTRAVENOUS

## 2017-09-28 MED ORDER — OXYCODONE HCL 5 MG PO TABS
5.0000 mg | ORAL_TABLET | Freq: Once | ORAL | Status: AC | PRN
  Administered 2017-09-28: 5 mg via ORAL

## 2017-09-28 MED ORDER — DEXAMETHASONE SODIUM PHOSPHATE 10 MG/ML IJ SOLN
INTRAMUSCULAR | Status: AC
  Filled 2017-09-28: qty 1

## 2017-09-28 MED ORDER — OXYCODONE-ACETAMINOPHEN 5-325 MG PO TABS: 1.0000 | ORAL_TABLET | ORAL | 0 refills | Status: DC | PRN

## 2017-09-28 MED ORDER — OXYCODONE HCL 5 MG/5ML PO SOLN: 5.0000 mg | Freq: Once | ORAL | Status: AC | PRN

## 2017-09-28 MED ORDER — MEPERIDINE HCL 25 MG/ML IJ SOLN: 6.2500 mg | INTRAMUSCULAR | Status: DC | PRN

## 2017-09-28 MED ORDER — CLINDAMYCIN PHOSPHATE 900 MG/50ML IV SOLN
INTRAVENOUS | Status: AC
  Filled 2017-09-28: qty 50

## 2017-09-28 MED ORDER — CLINDAMYCIN HCL 300 MG PO CAPS: 300.0000 mg | ORAL_CAPSULE | Freq: Three times a day (TID) | ORAL | 0 refills | Status: AC

## 2017-09-28 MED ORDER — PROPOFOL 10 MG/ML IV BOLUS
INTRAVENOUS | Status: DC | PRN
  Administered 2017-09-28: 150 mg via INTRAVENOUS

## 2017-09-28 SURGICAL SUPPLY — 49 items
APL SWBSTK 6 STRL LF DISP (MISCELLANEOUS)
APPLICATOR COTTON TIP 6 STRL (MISCELLANEOUS) IMPLANT
APPLICATOR COTTON TIP 6IN STRL (MISCELLANEOUS)
BALL CTTN LRG ABS STRL LF (GAUZE/BANDAGES/DRESSINGS) ×1
BLADE SURG 15 STRL LF DISP TIS (BLADE) ×1 IMPLANT
BLADE SURG 15 STRL SS (BLADE)
CANISTER SUCT 1200ML W/VALVE (MISCELLANEOUS) ×2 IMPLANT
COTTONBALL LRG STERILE PKG (GAUZE/BANDAGES/DRESSINGS) ×1 IMPLANT
COVER BACK TABLE 60X90IN (DRAPES) ×1 IMPLANT
COVER MAYO STAND STRL (DRAPES) ×1 IMPLANT
DECANTER SPIKE VIAL GLASS SM (MISCELLANEOUS) ×1 IMPLANT
DRAPE IMP U-DRAPE 54X76 (DRAPES) ×1 IMPLANT
ELECT COATED BLADE 2.86 ST (ELECTRODE) IMPLANT
ELECT NDL BLADE 2-5/6 (NEEDLE) ×1 IMPLANT
ELECT NEEDLE BLADE 2-5/6 (NEEDLE) ×2 IMPLANT
ELECT REM PT RETURN 9FT ADLT (ELECTROSURGICAL) ×2
ELECTRODE REM PT RTRN 9FT ADLT (ELECTROSURGICAL) ×1 IMPLANT
GAUZE PACKING IODOFORM 1/4X5 (PACKING) ×1 IMPLANT
GAUZE SPONGE 4X4 12PLY STRL LF (GAUZE/BANDAGES/DRESSINGS) ×1 IMPLANT
GLOVE BIO SURGEON STRL SZ7 (GLOVE) ×1 IMPLANT
GLOVE BIO SURGEON STRL SZ7.5 (GLOVE) ×2 IMPLANT
GLOVE BIOGEL PI IND STRL 7.0 (GLOVE) IMPLANT
GLOVE BIOGEL PI INDICATOR 7.0 (GLOVE) ×1
GLOVE ECLIPSE 6.5 STRL STRAW (GLOVE) ×1 IMPLANT
GOWN STRL REUS W/ TWL LRG LVL3 (GOWN DISPOSABLE) ×1 IMPLANT
GOWN STRL REUS W/ TWL XL LVL3 (GOWN DISPOSABLE) IMPLANT
GOWN STRL REUS W/TWL LRG LVL3 (GOWN DISPOSABLE) ×4
GOWN STRL REUS W/TWL XL LVL3 (GOWN DISPOSABLE) ×2
IV SET EXT 30 76VOL 4 MALE LL (IV SETS) IMPLANT
NDL BLUNT 17GA (NEEDLE) ×1 IMPLANT
NDL PRECISIONGLIDE 27X1.5 (NEEDLE) ×1 IMPLANT
NEEDLE BLUNT 17GA (NEEDLE) IMPLANT
NEEDLE PRECISIONGLIDE 27X1.5 (NEEDLE) ×2 IMPLANT
NS IRRIG 1000ML POUR BTL (IV SOLUTION) ×1 IMPLANT
PACK BASIN DAY SURGERY FS (CUSTOM PROCEDURE TRAY) ×2 IMPLANT
PACK ENT DAY SURGERY (CUSTOM PROCEDURE TRAY) ×1 IMPLANT
PENCIL BUTTON HOLSTER BLD 10FT (ELECTRODE) ×2 IMPLANT
SHEET MEDIUM DRAPE 40X70 STRL (DRAPES) ×1 IMPLANT
SOURCE LIGHT MICROSCOPE LEICA (MISCELLANEOUS) ×1 IMPLANT
SPONGE SURGIFOAM ABS GEL 12-7 (HEMOSTASIS) IMPLANT
SUT CHROMIC 4 0 P 3 18 (SUTURE) IMPLANT
SUT PLAIN 5 0 P 3 18 (SUTURE) IMPLANT
SUT VIC AB 3-0 FS2 27 (SUTURE) ×1 IMPLANT
SUT VICRYL 4-0 PS2 18IN ABS (SUTURE) IMPLANT
SWABSTICK POVIDONE IODINE SNGL (MISCELLANEOUS) IMPLANT
SYR CONTROL 10ML LL (SYRINGE) ×1 IMPLANT
TOWEL GREEN STERILE FF (TOWEL DISPOSABLE) ×2 IMPLANT
TRAY DSU PREP LF (CUSTOM PROCEDURE TRAY) ×1 IMPLANT
TUBE CONNECTING 20X1/4 (TUBING) ×1 IMPLANT

## 2017-09-28 NOTE — Discharge Instructions (Addendum)
°  Post Anesthesia Home Care Instructions  Activity: Get plenty of rest for the remainder of the day. A responsible individual must stay with you for 24 hours following the procedure.  For the next 24 hours, DO NOT: -Drive a car -Paediatric nurse -Drink alcoholic beverages -Take any medication unless instructed by your physician -Make any legal decisions or sign important papers.  Meals: Start with liquid foods such as gelatin or soup. Progress to regular foods as tolerated. Avoid greasy, spicy, heavy foods. If nausea and/or vomiting occur, drink only clear liquids until the nausea and/or vomiting subsides. Call your physician if vomiting continues.  Special Instructions/Symptoms: Your throat may feel dry or sore from the anesthesia or the breathing tube placed in your throat during surgery. If this causes discomfort, gargle with warm salt water. The discomfort should disappear within 24 hours.  If you had a scopolamine patch placed behind your ear for the management of post- operative nausea and/or vomiting:  1. The medication in the patch is effective for 72 hours, after which it should be removed.  Wrap patch in a tissue and discard in the trash. Wash hands thoroughly with soap and water. 2. You may remove the patch earlier than 72 hours if you experience unpleasant side effects which may include dry mouth, dizziness or visual disturbances. 3. Avoid touching the patch. Wash your hands with soap and water after contact with the patch.   The patient may resume all his previous activities and diet. He may change the right ear cotton ball as needed.

## 2017-09-28 NOTE — Anesthesia Postprocedure Evaluation (Signed)
Anesthesia Post Note  Patient: [name] [name]  Procedure(s) Performed: EXCISION OF EXTERNAL AUDITORY CANAL MASS (Right )     Patient location during evaluation: PACU Anesthesia Type: General Level of consciousness: awake and alert Pain management: pain level controlled Vital Signs Assessment: post-procedure vital signs reviewed and stable Respiratory status: spontaneous breathing, nonlabored ventilation and respiratory function stable Cardiovascular status: blood pressure returned to baseline and stable Postop Assessment: no apparent nausea or vomiting Anesthetic complications: no    Last Vitals:  Vitals:   09/28/17 1330 09/28/17 1336  BP: (!) 148/62   Pulse: 67 68  Resp: 12 14  Temp:    SpO2: 95% 90%    Last Pain:  Vitals:   09/28/17 1336  TempSrc:   PainSc: 3                  Audry Pili

## 2017-09-28 NOTE — Anesthesia Procedure Notes (Signed)
Procedure Name: Intubation Performed by: Verita Lamb, CRNA Pre-anesthesia Checklist: Patient identified, Emergency Drugs available, Suction available, Patient being monitored and Timeout performed Patient Re-evaluated:Patient Re-evaluated prior to induction Oxygen Delivery Method: Circle system utilized Preoxygenation: Pre-oxygenation with 100% oxygen Induction Type: IV induction Ventilation: Mask ventilation without difficulty Laryngoscope Size: Glidescope and 3 Grade View: Grade I Tube type: Oral Tube size: 7.0 mm Number of attempts: 1 Airway Equipment and Method: Stylet Placement Confirmation: ETT inserted through vocal cords under direct vision,  CO2 detector,  positive ETCO2 and breath sounds checked- equal and bilateral Secured at: 23 cm Tube secured with: Tape Dental Injury: Teeth and Oropharynx as per pre-operative assessment

## 2017-09-28 NOTE — Anesthesia Preprocedure Evaluation (Signed)
Anesthesia Evaluation  Patient identified by MRN, date of birth, ID band Patient awake    Reviewed: Allergy & Precautions, NPO status , Patient's Chart, lab work & pertinent test results  Airway Mallampati: II  TM Distance: >3 FB Neck ROM: Full    Dental  (+) Partial Upper   Pulmonary former smoker,    breath sounds clear to auscultation       Cardiovascular hypertension, Pt. on medications + CAD  + dysrhythmias Atrial Fibrillation + Valvular Problems/Murmurs  Rhythm:Regular Rate:Normal     Neuro/Psych negative neurological ROS  negative psych ROS   GI/Hepatic negative GI ROS, Neg liver ROS,   Endo/Other  diabetes, Type 2, Insulin Dependent, Oral Hypoglycemic Agents  Renal/GU Renal disease     Musculoskeletal negative musculoskeletal ROS (+)   Abdominal   Peds negative pediatric ROS (+)  Hematology negative hematology ROS (+)   Anesthesia Other Findings   Reproductive/Obstetrics                             Anesthesia Physical  Anesthesia Plan  ASA: III  Anesthesia Plan: General   Post-op Pain Management:    Induction: Intravenous  PONV Risk Score and Plan: 3 and Ondansetron, Dexamethasone and Treatment may vary due to age or medical condition  Airway Management Planned: Oral ETT  Additional Equipment:   Intra-op Plan:   Post-operative Plan: Extubation in OR  Informed Consent: I have reviewed the patients History and Physical, chart, labs and discussed the procedure including the risks, benefits and alternatives for the proposed anesthesia with the patient or authorized representative who has indicated his/her understanding and acceptance.   Dental advisory given  Plan Discussed with: CRNA  Anesthesia Plan Comments:         Anesthesia Quick Evaluation

## 2017-09-28 NOTE — H&P (Signed)
Cc: Right infected ear canal cyst  HPI: The patient is an 83 year old male who returns today for his follow-up evaluation. The patient was previously noted to have an infected cyst within the right ear canal.  He underwent incision and drainage of the infected cyst.  He was also placed on Ciprodex eardrops and clindamycin.  The patient returns today complaining of persistent drainage and pain in the right ear.  The pain has not significantly changed.  He has not been able to use his right ear hearing aid.   Exam: General: Communicates without difficulty, well nourished, no acute distress. Head: Normocephalic, no evidence injury, no tenderness, facial buttresses intact without stepoff. Eyes: PERRL, EOMI. No scleral icterus, conjunctivae clear. Neuro: CN II exam reveals vision grossly intact. No nystagmus at any point of gaze. Ears: Auricles well formed without lesions. Purulent drainage is noted from the right ear canal.  An infected cyst/abscess is noted within the inferior aspect of the right ear canal. The right tympanic membrane and middle ear space are normal.  The left ear is normal.  Nose: External evaluation reveals normal support and skin without lesions. Dorsum is intact. Anterior rhinoscopy reveals mildly congested mucosa over anterior aspect of inferior turbinates and intact septum. No purulence noted. Oral:  Oral cavity and oropharynx are intact, symmetric, without erythema or edema. Mucosa is moist without lesions. Neck: Full range of motion without pain. There is no significant lymphadenopathy. No masses palpable. Thyroid bed within normal limits to palpation. Parotid glands and submandibular glands equal bilaterally without mass. Trachea is midline. Neuro:  CN 2-12 grossly intact. Gait normal. Vestibular: No nystagmus at any point of gaze. The cerebellar examination is unremarkable.   Assessment  1.  The patient continues to have an infected cyst within the right ear.  2.  The left ear is  normal.   Plan   1.  Otomicroscopy with debridement of the right ear canal. The drainage is suctioned.  2.  The physical exam findings are reviewed with the patient.  3.  In light of his persistent ear canal cyst, he will likely need to undergo surgical excision of the cyst in the operating room.  The risks, benefits, and details of the procedure are reviewed with the patient.

## 2017-09-28 NOTE — Transfer of Care (Signed)
Immediate Anesthesia Transfer of Care Note  Patient: [name] [name]  Procedure(s) Performed: EXCISION OF EXTERNAL AUDITORY CANAL MASS (Right )  Patient Location: PACU  Anesthesia Type:General  Level of Consciousness: awake, alert  and oriented  Airway & Oxygen Therapy: Patient Spontanous Breathing and Patient connected to face mask oxygen  Post-op Assessment: Report given to RN and Post -op Vital signs reviewed and stable  Post vital signs: Reviewed and stable  Last Vitals:  Vitals Value Taken Time  BP 159/68 09/28/2017  1:00 PM  Temp    Pulse 73 09/28/2017  1:02 PM  Resp 12 09/28/2017  1:02 PM  SpO2 100 % 09/28/2017  1:02 PM  Vitals shown include unvalidated device data.  Last Pain:  Vitals:   09/28/17 1258  TempSrc:   PainSc: (P) Asleep      Patients Stated Pain Goal: 1 (70/01/74 9449)  Complications: No apparent anesthesia complications

## 2017-09-28 NOTE — Op Note (Signed)
DATE OF PROCEDURE:  09/28/2017                              OPERATIVE REPORT  SURGEON:  Leta Baptist, MD  PREOPERATIVE DIAGNOSES: 1. Infected right ear canal mass  POSTOPERATIVE DIAGNOSES: 1. Infected right ear canal mass  PROCEDURE PERFORMED:  Excision of right ear canal mass.  ANESTHESIA:  General endotracheal tube anesthesia.  COMPLICATIONS:  None.  ESTIMATED BLOOD LOSS:  Minimal.  INDICATION FOR PROCEDURE:  [name] [name] is a 83 y.o. male with a history of persistent right ear pain. He was noted to have an infected mass within the right ear canal. He previously underwent incision and drainage of the infected mass. He was also treated with oral and topical antibiotics. He continued to be symptomatic.Based on the above findings, the decision was made for the patient to undergo the above-stated procedure. Likelihood of success in reducing symptoms was also discussed.  The risks, benefits, alternatives, and details of the procedure were discussed with the patient.  Questions were invited and answered.  Informed consent was obtained.  DESCRIPTION:  The patient was taken to the operating room and placed supine on the operating table.  General endotracheal tube anesthesia was administered by the anesthesiologist.  The patient was positioned and prepped and draped in a standard fashion for right ear surgery.  Under the operating microscope, the right ear canal was examined. A soft tissue mass was noted on the inferior aspect of the right ear canal. An elliptical incision was made around the soft tissue mass. The entire mass was removed. The specimen was sent to the pathology department for permanent histologic identification. The right ear canal was packed with iodoform packing.  The care of the patient was turned over to the anesthesiologist.  The patient was awakened from anesthesia without difficulty.  The patient was extubated and transferred to the recovery room in good condition.  OPERATIVE  FINDINGS:  Right ear canal mass.  SPECIMEN:  Right ear canal mass.  FOLLOWUP CARE:  The patient will be discharged home once awake and alert.  The patient will follow up in my office in approximately 1 week.  [name] [name] 09/28/2017 12:48 PM

## 2017-09-29 ENCOUNTER — Encounter (HOSPITAL_BASED_OUTPATIENT_CLINIC_OR_DEPARTMENT_OTHER): Payer: Self-pay | Admitting: Otolaryngology

## 2017-09-29 NOTE — Addendum Note (Signed)
Addendum  created 09/29/17 1121 by Tawni Millers, CRNA   Charge Capture section accepted

## 2017-09-30 ENCOUNTER — Telehealth: Payer: Self-pay | Admitting: *Deleted

## 2017-09-30 NOTE — Telephone Encounter (Signed)
Wife called to let me know pt had ear surgery on 6/25.  Was told by Dr Benjamine Mola to restart coumadin today.  He will restart at regular dose and has f/u appt with me for INR check on 10/12/17.

## 2017-09-30 NOTE — Telephone Encounter (Signed)
Mrs. Kirker called asking to speak with coumdin nurse. Please call [phone]

## 2017-10-04 ENCOUNTER — Ambulatory Visit (INDEPENDENT_AMBULATORY_CARE_PROVIDER_SITE_OTHER): Payer: Medicare Other | Admitting: Otolaryngology

## 2017-10-12 ENCOUNTER — Ambulatory Visit (INDEPENDENT_AMBULATORY_CARE_PROVIDER_SITE_OTHER): Payer: Medicare Other | Admitting: *Deleted

## 2017-10-12 DIAGNOSIS — Z5181 Encounter for therapeutic drug level monitoring: Secondary | ICD-10-CM | POA: Diagnosis not present

## 2017-10-12 DIAGNOSIS — Z7901 Long term (current) use of anticoagulants: Secondary | ICD-10-CM | POA: Diagnosis not present

## 2017-10-12 DIAGNOSIS — I4891 Unspecified atrial fibrillation: Secondary | ICD-10-CM

## 2017-10-12 LAB — POCT INR: INR: 3 (ref 2.0–3.0)

## 2017-10-12 NOTE — Patient Instructions (Signed)
Take coumadin 1/2 tablet tonight then resume 1 1/2 tablets daily except 1 tablet on Tuesdays and Fridays Recheck in 2 weeks

## 2017-10-15 ENCOUNTER — Emergency Department (HOSPITAL_COMMUNITY)
Admission: EM | Admit: 2017-10-15 | Discharge: 2017-10-16 | Disposition: A | Discharge status: Home / Self Care | Payer: Medicare Other | Attending: Emergency Medicine | Admitting: Emergency Medicine

## 2017-10-15 DIAGNOSIS — R51 Headache: Secondary | ICD-10-CM | POA: Insufficient documentation

## 2017-10-15 DIAGNOSIS — H9201 Otalgia, right ear: Secondary | ICD-10-CM | POA: Diagnosis not present

## 2017-10-15 DIAGNOSIS — Z87891 Personal history of nicotine dependence: Secondary | ICD-10-CM | POA: Insufficient documentation

## 2017-10-15 DIAGNOSIS — R42 Dizziness and giddiness: Secondary | ICD-10-CM | POA: Diagnosis not present

## 2017-10-15 DIAGNOSIS — Z79899 Other long term (current) drug therapy: Secondary | ICD-10-CM | POA: Diagnosis not present

## 2017-10-15 DIAGNOSIS — I10 Essential (primary) hypertension: Secondary | ICD-10-CM | POA: Diagnosis not present

## 2017-10-15 DIAGNOSIS — I2510 Atherosclerotic heart disease of native coronary artery without angina pectoris: Secondary | ICD-10-CM | POA: Insufficient documentation

## 2017-10-15 DIAGNOSIS — Z794 Long term (current) use of insulin: Secondary | ICD-10-CM | POA: Diagnosis not present

## 2017-10-15 DIAGNOSIS — E119 Type 2 diabetes mellitus without complications: Secondary | ICD-10-CM | POA: Diagnosis not present

## 2017-10-15 DIAGNOSIS — H9202 Otalgia, left ear: Secondary | ICD-10-CM | POA: Diagnosis not present

## 2017-10-16 ENCOUNTER — Other Ambulatory Visit: Payer: Self-pay

## 2017-10-16 ENCOUNTER — Encounter (HOSPITAL_COMMUNITY): Payer: Self-pay | Admitting: *Deleted

## 2017-10-16 ENCOUNTER — Emergency Department (HOSPITAL_COMMUNITY): Payer: Medicare Other

## 2017-10-16 DIAGNOSIS — H9201 Otalgia, right ear: Secondary | ICD-10-CM | POA: Diagnosis not present

## 2017-10-16 DIAGNOSIS — R51 Headache: Secondary | ICD-10-CM | POA: Diagnosis not present

## 2017-10-16 LAB — CBC WITH DIFFERENTIAL/PLATELET
BASOS PCT: 0 %
Basophils Absolute: 0 10*3/uL (ref 0.0–0.1)
EOS ABS: 0.4 10*3/uL (ref 0.0–0.7)
Eosinophils Relative: 2 %
HCT: 43.3 % (ref 39.0–52.0)
Hemoglobin: 14.6 g/dL (ref 13.0–17.0)
LYMPHS PCT: 57 %
Lymphs Abs: 9.9 10*3/uL — ABNORMAL HIGH (ref 0.7–4.0)
MCH: 29.4 pg (ref 26.0–34.0)
MCHC: 33.7 g/dL (ref 30.0–36.0)
MCV: 87.3 fL (ref 78.0–100.0)
MONO ABS: 1.2 10*3/uL — AB (ref 0.1–1.0)
Monocytes Relative: 7 %
NEUTROS ABS: 6 10*3/uL (ref 1.7–7.7)
NEUTROS PCT: 34 %
Platelets: 184 10*3/uL (ref 150–400)
RBC: 4.96 MIL/uL (ref 4.22–5.81)
RDW: 15 % (ref 11.5–15.5)
WBC: 17.5 10*3/uL — ABNORMAL HIGH (ref 4.0–10.5)

## 2017-10-16 LAB — BASIC METABOLIC PANEL
ANION GAP: 10 (ref 5–15)
BUN: 31 mg/dL — ABNORMAL HIGH (ref 8–23)
CALCIUM: 9.5 mg/dL (ref 8.9–10.3)
CO2: 27 mmol/L (ref 22–32)
Chloride: 97 mmol/L — ABNORMAL LOW (ref 98–111)
Creatinine, Ser: 1.04 mg/dL (ref 0.61–1.24)
GFR calc Af Amer: >60 mL/min (ref >60)
Glucose, Bld: 302 mg/dL — ABNORMAL HIGH (ref 70–99)
POTASSIUM: 4.1 mmol/L (ref 3.5–5.1)
Sodium: 134 mmol/L — ABNORMAL LOW (ref 135–145)

## 2017-10-16 LAB — PROTIME-INR
INR: 2.42
PROTHROMBIN TIME: 26.1 s — AB (ref 11.4–15.2)

## 2017-10-16 MED ORDER — HYDROCODONE-ACETAMINOPHEN 5-325 MG PO TABS: 1.0000 | ORAL_TABLET | Freq: Four times a day (QID) | ORAL | 0 refills | Status: DC | PRN

## 2017-10-16 MED ORDER — IOHEXOL 300 MG/ML  SOLN
75.0000 mL | Freq: Once | INTRAMUSCULAR | Status: AC | PRN
  Administered 2017-10-16: 75 mL via INTRAVENOUS

## 2017-10-16 MED ORDER — CLINDAMYCIN HCL 300 MG PO CAPS: 300.0000 mg | ORAL_CAPSULE | Freq: Four times a day (QID) | ORAL | 0 refills | Status: DC

## 2017-10-16 MED ORDER — HYDROCODONE-ACETAMINOPHEN 5-325 MG PO TABS
1.0000 | ORAL_TABLET | Freq: Once | ORAL | Status: AC
  Administered 2017-10-16: 1 via ORAL
  Filled 2017-10-16: qty 1

## 2017-10-16 NOTE — ED Notes (Signed)
Pt back from CT

## 2017-10-16 NOTE — ED Triage Notes (Signed)
Pt had right ear surgery x 2 weeks ago and pt spoke with Dr. Deeann Saint office and is supposed to be scheduled for a CT due to the pain; pt had surgery to have a cyst removed;

## 2017-10-16 NOTE — ED Provider Notes (Signed)
[facility] EMERGENCY DEPARTMENT Provider Note   CSN: [id number] Arrival date & time: 10/15/17  2353     History   Chief Complaint Chief Complaint  Patient presents with  . Otalgia     Pt seen with NP student, I performed history/physical/documentation  HPI [name] [name] is a 83 y.o. male.  The history is provided by the patient.  Otalgia  This is a new problem. The current episode started 2 days ago. There is pain in the right ear. The problem occurs constantly. There has been no fever. Associated symptoms include ear discharge. Pertinent negatives include no vomiting and no cough.   Pt had cyst removed from right ear 2 weeks ago by Dr Benjamine Mola Pain began 2 days ago and is worsening Pain is constant He has used antibiotics and pain meds without improvement No dizziness  Past Medical History:  Diagnosis Date  . Atrial fibrillation ([place])   . Chronic lymphocytic leukemia ([place])   . Coronary atherosclerosis of native coronary artery    Nonobstructive  . Essential hypertension, benign   . Hyperlipidemia   . IgA nephropathy    Dr. Lowanda Foster  . Type 2 diabetes mellitus ([place])   . UTI (urinary tract infection) july  10 th   taking  med     Patient Active Problem List   Diagnosis Date Noted  . Essential hypertension 09/10/2016  . Rt Heel Osteomyelitis ([place]) 09/08/2016  . Cellulitis of heel, right 09/08/2016  . Hypoglycemia 06/21/2016  . Type 2 diabetes mellitus ([place])   . Chronic lymphocytic leukemia ([place])   . Encounter for therapeutic drug monitoring 05/16/2013  . Cardiac murmur 10/21/2011  . Long term current use of anticoagulant therapy 06/27/2010  . Mixed hyperlipidemia 05/21/2008  . CORONARY ATHEROSCLEROSIS NATIVE CORONARY ARTERY 05/21/2008  . Atrial fibrillation ([place]) 05/21/2008  . KNEE PAIN 06/14/2007    Past Surgical History:  Procedure Laterality Date  . AMPUTATION Left 09/15/2012   Procedure: PARTIAL AMPUTATION 3rd TOE LEFT FOOT;  Surgeon: Marcheta Grammes, DPM;  Location: [facility];  Service: Orthopedics;  Laterality: Left;  . AMPUTATION Left 04/20/2013   Procedure: PARTIAL AMPUTATION 2ND TOE LEFT FOOT;  Surgeon: Marcheta Grammes, DPM;  Location: [facility];  Service: Orthopedics;  Laterality: Left;  . ANAL FISSURE REPAIR    . APPLICATION OF WOUND VAC Right 09/09/2016   Procedure: APPLICATION OF WOUND VAC;  Surgeon: Caprice Beaver, DPM;  Location: [facility];  Service: Podiatry;  Laterality: Right;  . CATARACT EXTRACTION W/PHACO Right 09/30/2015   Procedure: CATARACT EXTRACTION PHACO AND INTRAOCULAR LENS PLACEMENT (IOC);  Surgeon: Tonny Branch, MD;  Location: [facility];  Service: Ophthalmology;  Laterality: Right;  CDE: 11.78  . CATARACT EXTRACTION W/PHACO Left 10/31/2015   Procedure: CATARACT EXTRACTION PHACO AND INTRAOCULAR LENS PLACEMENT LEFT EYE; CDE:  9.10;  Surgeon: Tonny Branch, MD;  Location: [facility];  Service: Ophthalmology;  Laterality: Left;  . COLONOSCOPY  08/19/2011   Procedure: COLONOSCOPY;  Surgeon: Rogene Houston, MD;  Location: [facility];  Service: Endoscopy;  Laterality: N/A;  930  . COLONOSCOPY N/A 09/19/2014   Procedure: COLONOSCOPY;  Surgeon: Rogene Houston, MD;  Location: [facility];  Service: Endoscopy;  Laterality: N/A;  1055  . FEMORAL HERNIA REPAIR    . INCISION AND DRAINAGE Right 09/09/2016   Procedure: DEBRIDEMENT WOUND RT heel with wound vac attachment;  Surgeon: Caprice Beaver, DPM;  Location: [facility];  Service: Podiatry;  Laterality: Right;  . INCISION AND DRAINAGE  OF WOUND Right 08/12/2016   Procedure: DEBRIDEMENT WOUND RIGHT HEEL;  Surgeon: Caprice Beaver, DPM;  Location: [facility];  Service: Podiatry;  Laterality: Right;  right heel  . MASS EXCISION Right 09/28/2017   Procedure: EXCISION OF EXTERNAL AUDITORY CANAL MASS;  Surgeon: Leta Baptist, MD;  Location: [place];  Service: ENT;  Laterality: Right;  . Open repair left quadriceps tendon.  08/22/07   Dr. Aline Brochure  . Right total knee replacement     . TRANSURETHRAL RESECTION OF PROSTATE    . WOUND EXPLORATION Right 08/12/2016   Procedure: EXPLORATION OF WOUND FOR FOREIGN BODY RIGHT HEEL;  Surgeon: Caprice Beaver, DPM;  Location: [facility];  Service: Podiatry;  Laterality: Right;  right heel        Home Medications    Prior to Admission medications   Medication Sig Start Date End Date Taking? Authorizing Provider  Ascorbic Acid (VITAMIN C) 1000 MG tablet Take 1,000 mg by mouth daily with breakfast.     [provider]  atorvastatin (LIPITOR) 10 MG tablet Take 10 mg by mouth every evening.    [provider]  Calcium Carb-Cholecalciferol (CALCIUM 600+D) 600-800 MG-UNIT TABS Take 2 tablets by mouth daily with breakfast.    [provider]  cholecalciferol (VITAMIN D) 1000 units tablet Take 1,000 Units by mouth every morning.    [provider]  Coenzyme Q10 (CO Q-10) 100 MG CAPS Take 100 mg by mouth daily with breakfast.     [provider]  digoxin (LANOXIN) 0.125 MG tablet Take 0.125 mg by mouth daily with breakfast.     [provider]  finasteride (PROSCAR) 5 MG tablet Take 5 mg by mouth daily.    [provider]  furosemide (LASIX) 20 MG tablet Take 20 mg by mouth daily.    [provider]  furosemide (LASIX) 20 MG tablet Take 2 pills a day 06/29/17   Milton Ferguson, MD  glimepiride (AMARYL) 2 MG tablet Take 2 mg by mouth daily with breakfast.  06/11/16   [provider]  insulin aspart (NOVOLOG FLEXPEN) 100 UNIT/ML FlexPen Pt uses per sliding scale before lunch and dinner.    [provider]  Insulin Detemir (LEVEMIR FLEXPEN) 100 UNIT/ML Pen Inject 20 Units into the skin every morning.    [provider]  lisinopril (PRINIVIL,ZESTRIL) 20 MG tablet Take 40 mg by mouth daily with supper. 07/07/16   [provider]  LORazepam (ATIVAN) 1 MG tablet Take 1 tablet by mouth at bedtime.  04/28/17   [provider]  Multiple Vitamin  (MULTIVITAMIN WITH MINERALS) TABS tablet Take 1 tablet by mouth daily with breakfast.    [provider]  oxyCODONE-acetaminophen (PERCOCET/ROXICET) 5-325 MG tablet Take 1 tablet by mouth every 4 (four) hours as needed for severe pain. 09/28/17   Leta Baptist, MD  pioglitazone (ACTOS) 15 MG tablet Take 15 mg by mouth every evening.     [provider]  VICTOZA 18 MG/3ML SOPN Inject 1.8 mg into the skin daily with breakfast. 06/05/16   [provider]    Family History Family History  Problem Relation Age of Onset  . Atrial fibrillation Mother     Social History Social History   Tobacco Use  . Smoking status: Former Smoker    Years: 10.00    Types: Cigarettes    Last attempt to quit: 04/07/1963    Years since quitting: 54.5  . Smokeless tobacco: Never Used  Substance Use Topics  .  Alcohol use: No    Alcohol/week: 0.0 oz  . Drug use: No     Allergies   Erythromycin and Penicillins   Review of Systems Review of Systems  Constitutional: Negative for fever.  HENT: Positive for ear discharge and ear pain.   Eyes: Positive for discharge. Negative for visual disturbance.  Respiratory: Negative for cough.   Gastrointestinal: Negative for vomiting.  Neurological: Positive for dizziness.  All other systems reviewed and are negative.    Physical Exam Updated Vital Signs BP (!) 162/76 (BP Location: Right Arm)   Pulse 86   Temp 97.8 F (36.6 C) (Oral)   Resp 16   Ht 1.829 m (6')   Wt 132 kg (291 lb)   SpO2 97%   BMI 39.47 kg/m   Physical Exam CONSTITUTIONAL: elderly, no acute distress HEAD: Normocephalic/atraumatic EYES: EOMI/PERRL ENMT: Mucous membranes moist, left ear unremarkable, right ear appears symmetric, no mastoid tenderness/erythema/crepitus, TM appears intact. NECK: supple no meningeal signs SPINE/BACK:entire spine nontender No CV: Loud murmurs LUNGS: Lungs are clear to auscultation bilaterally, no apparent distress ABDOMEN: soft,  nontender NEURO: Pt is awake/alert/appropriate, moves all extremitiesx4.  EXTREMITIES: pulses normal/equal, full ROM SKIN: warm, color normal PSYCH: no abnormalities of mood noted, alert and oriented to situation   ED Treatments / Results  Labs (all labs ordered are listed, but only abnormal results are displayed) Labs Reviewed  BASIC METABOLIC PANEL - Abnormal; Notable for the following components:      Result Value   Sodium 134 (*)    Chloride 97 (*)    Glucose, Bld 302 (*)    BUN 31 (*)    All other components within normal limits  CBC WITH DIFFERENTIAL/PLATELET - Abnormal; Notable for the following components:   WBC 17.5 (*)    Lymphs Abs 9.9 (*)    Monocytes Absolute 1.2 (*)    All other components within normal limits  PROTIME-INR - Abnormal; Notable for the following components:   Prothrombin Time 26.1 (*)    All other components within normal limits    EKG None  Radiology Ct Head Wo Contrast  Result Date: 10/16/2017 CLINICAL DATA:  Right-sided ear pain and headache for 2 weeks after surgery. EXAM: CT HEAD WITHOUT CONTRAST CT TEMPORAL BONES WITH CONTRAST TECHNIQUE: Contiguous axial images were obtained from the base of the skull through the vertex without contrast. Multidetector CT imaging of the temporal bones was performed using the standard protocol with intravenous contrast. CONTRAST: 75 mL Omnipaque 300 COMPARISON:  Head CT 06/02/2007 FINDINGS: CT HEAD FINDINGS Brain: There is no mass, hemorrhage or extra-axial collection. There is generalized atrophy without lobar predilection. There is no acute or chronic infarction. The brain parenchyma is normal. Vascular: No abnormal hyperdensity of the major intracranial arteries or dural venous sinuses. No intracranial atherosclerosis. Skull: The visualized skull base, calvarium and extracranial soft tissues are normal. Sinuses/Orbits: No fluid levels or advanced mucosal thickening of the visualized paranasal sinuses. The orbits  are normal. CT TEMPORAL BONES FINDINGS RIGHT: --Pinna and external auditory canal: Normal. --Ossicular chain: Normal. No erosion or dislocation. --Tympanic membrane: Normal. --Middle ear: Small amounts of intermediate density material within the middle ear adjacent to the tympanic membrane. --Epitympanum: Minimal opacification within the epitympanum. The scutum is sharp. Tegmen tympani is intact. --Cochlea, vestibule, vestibular aqueduct and semicircular canals: Normal. No evidence of canal dehiscence or otospongiosis. --Internal auditory canal: Normal. No widening of the porus acusticus. --Facial nerve: No focal abnormality along the course of the facial nerve. --  Cerebellopontine angle: Normal. --Petrous Apex: Normal. --Mastoids: Partial opacification of the mastoid air cells. No coalescence. --Carotid canal: Normal position. LEFT: --Pinna and external auditory canal: Normal. --Ossicular chain: Normal. No erosion or dislocation. --Tympanic membrane: Normal. --Middle ear: Normal. --Epitympanum: The Prussak space is clear. The scutum is sharp. Tegmen tympani is intact. --Cochlea, vestibule, vestibular aqueduct and semicircular canals: Normal. No evidence of canal dehiscence or otospongiosis. --Internal auditory canal: Normal. No widening of the porus acusticus. --Facial nerve: No focal abnormality along the course of the facial nerve. --Cerebellopontine angle: Normal. --Petrous Apex: Normal. --Mastoids: Normal. --Carotid canal: Normal position. OTHER: --Nasopharynx: Clear. --Temporomandibular joints: Normal. IMPRESSION: 1. Mild brain parenchymal volume loss without acute intracranial abnormality. 2. Small right mastoid effusion without coalescence. Minimal opacification of the middle ear. No acute findings. 3. Normal left temporal bone structures. Electronically Signed   By: Ulyses Jarred M.D.   On: 10/16/2017 03:45   Ct Temporal Bones W Contrast  Result Date: 10/16/2017 CLINICAL DATA:  Right-sided ear pain and  headache for 2 weeks after surgery. EXAM: CT HEAD WITHOUT CONTRAST CT TEMPORAL BONES WITH CONTRAST TECHNIQUE: Contiguous axial images were obtained from the base of the skull through the vertex without contrast. Multidetector CT imaging of the temporal bones was performed using the standard protocol with intravenous contrast. CONTRAST: 75 mL Omnipaque 300 COMPARISON:  Head CT 06/02/2007 FINDINGS: CT HEAD FINDINGS Brain: There is no mass, hemorrhage or extra-axial collection. There is generalized atrophy without lobar predilection. There is no acute or chronic infarction. The brain parenchyma is normal. Vascular: No abnormal hyperdensity of the major intracranial arteries or dural venous sinuses. No intracranial atherosclerosis. Skull: The visualized skull base, calvarium and extracranial soft tissues are normal. Sinuses/Orbits: No fluid levels or advanced mucosal thickening of the visualized paranasal sinuses. The orbits are normal. CT TEMPORAL BONES FINDINGS RIGHT: --Pinna and external auditory canal: Normal. --Ossicular chain: Normal. No erosion or dislocation. --Tympanic membrane: Normal. --Middle ear: Small amounts of intermediate density material within the middle ear adjacent to the tympanic membrane. --Epitympanum: Minimal opacification within the epitympanum. The scutum is sharp. Tegmen tympani is intact. --Cochlea, vestibule, vestibular aqueduct and semicircular canals: Normal. No evidence of canal dehiscence or otospongiosis. --Internal auditory canal: Normal. No widening of the porus acusticus. --Facial nerve: No focal abnormality along the course of the facial nerve. --Cerebellopontine angle: Normal. --Petrous Apex: Normal. --Mastoids: Partial opacification of the mastoid air cells. No coalescence. --Carotid canal: Normal position. LEFT: --Pinna and external auditory canal: Normal. --Ossicular chain: Normal. No erosion or dislocation. --Tympanic membrane: Normal. --Middle ear: Normal. --Epitympanum: The  Prussak space is clear. The scutum is sharp. Tegmen tympani is intact. --Cochlea, vestibule, vestibular aqueduct and semicircular canals: Normal. No evidence of canal dehiscence or otospongiosis. --Internal auditory canal: Normal. No widening of the porus acusticus. --Facial nerve: No focal abnormality along the course of the facial nerve. --Cerebellopontine angle: Normal. --Petrous Apex: Normal. --Mastoids: Normal. --Carotid canal: Normal position. OTHER: --Nasopharynx: Clear. --Temporomandibular joints: Normal. IMPRESSION: 1. Mild brain parenchymal volume loss without acute intracranial abnormality. 2. Small right mastoid effusion without coalescence. Minimal opacification of the middle ear. No acute findings. 3. Normal left temporal bone structures. Electronically Signed   By: Ulyses Jarred M.D.   On: 10/16/2017 03:45    Procedures Procedures   Medications Ordered in ED Medications  HYDROcodone-acetaminophen (NORCO/VICODIN) 5-325 MG per tablet 1 tablet (1 tablet Oral Given 10/16/17 0147)  iohexol (OMNIPAQUE) 300 MG/ML solution 75 mL (75 mLs Intravenous Contrast Given 10/16/17 0317)  Initial Impression / Assessment and Plan / ED Course  I have reviewed the triage vital signs and the nursing notes.  Pertinent labs  results that were available during my care of the patient were reviewed by me and considered in my medical decision making (see chart for details). Narcotic database reviewed and considered in decision making     12:51 AM Patient with right ear cyst surgery several weeks ago, now having significant pain around the ear.  Clinically he is in no distress.  He was supposed to have an outpatient CT scan but that was not done yet this will be ordered here 4:06 AM Patient improved and resting comfortably.  We discussed CT findings, he has small mastoid effusion.  We will start him on clindamycin.  He has a penicillin allergy.  Short course of pain medicine ordered.  He will call his ENT  surgeon in 48 hours Final Clinical Impressions(s) / ED Diagnoses   Final diagnoses:  Right ear pain    ED Discharge Orders        Ordered    HYDROcodone-acetaminophen (NORCO/VICODIN) 5-325 MG tablet  Every 6 hours PRN     10/16/17 0404    clindamycin (CLEOCIN) 300 MG capsule  4 times daily     10/16/17 0404       Ripley Fraise, MD 10/16/17 0407

## 2017-10-17 ENCOUNTER — Emergency Department (HOSPITAL_COMMUNITY)
Admission: EM | Admit: 2017-10-17 | Discharge: 2017-10-17 | Disposition: A | Discharge status: Home / Self Care | Payer: Medicare Other | Attending: Emergency Medicine | Admitting: Emergency Medicine

## 2017-10-17 ENCOUNTER — Encounter (HOSPITAL_COMMUNITY): Payer: Self-pay | Admitting: Emergency Medicine

## 2017-10-17 DIAGNOSIS — Z856 Personal history of leukemia: Secondary | ICD-10-CM | POA: Diagnosis not present

## 2017-10-17 DIAGNOSIS — Z79899 Other long term (current) drug therapy: Secondary | ICD-10-CM | POA: Insufficient documentation

## 2017-10-17 DIAGNOSIS — Z7984 Long term (current) use of oral hypoglycemic drugs: Secondary | ICD-10-CM | POA: Insufficient documentation

## 2017-10-17 DIAGNOSIS — G510 Bell's palsy: Secondary | ICD-10-CM | POA: Diagnosis not present

## 2017-10-17 DIAGNOSIS — R29810 Facial weakness: Secondary | ICD-10-CM | POA: Diagnosis present

## 2017-10-17 DIAGNOSIS — E119 Type 2 diabetes mellitus without complications: Secondary | ICD-10-CM | POA: Diagnosis not present

## 2017-10-17 DIAGNOSIS — Z87891 Personal history of nicotine dependence: Secondary | ICD-10-CM | POA: Diagnosis not present

## 2017-10-17 DIAGNOSIS — I10 Essential (primary) hypertension: Secondary | ICD-10-CM | POA: Diagnosis not present

## 2017-10-17 DIAGNOSIS — I2510 Atherosclerotic heart disease of native coronary artery without angina pectoris: Secondary | ICD-10-CM | POA: Insufficient documentation

## 2017-10-17 MED ORDER — PREDNISONE 20 MG PO TABS
30.0000 mg | ORAL_TABLET | Freq: Once | ORAL | Status: AC
  Administered 2017-10-17: 30 mg via ORAL
  Filled 2017-10-17: qty 1

## 2017-10-17 MED ORDER — PREDNISONE 10 MG PO TABS: 10.0000 mg | ORAL_TABLET | Freq: Every day | ORAL | 0 refills | Status: DC

## 2017-10-17 MED ORDER — HYDROCODONE-ACETAMINOPHEN 5-325 MG PO TABS: 1.0000 | ORAL_TABLET | ORAL | 0 refills | Status: DC | PRN

## 2017-10-17 NOTE — ED Provider Notes (Signed)
[facility] Provider Note   CSN: [id number] Arrival date & time: 10/17/17  1051     History   Chief Complaint Chief Complaint  Patient presents with  . Facial Droop    HPI [name] [name] is a 83 y.o. male.  Patient presents with numbness and tingling of his right cheek, minimal drooping of his right lip, inability to completely close his right eye, since 9:30 PM last night.  He had right ear cyst surgery several weeks ago by Dr. Benjamine Mola.  He was seen in the emergency department approximately 36 hours ago.  CT scan at that time revealed a small mastoid effusion.  He was started on clindamycin.  No neuro deficits, fever, chills, stiff neck.     Past Medical History:  Diagnosis Date  . Atrial fibrillation ([place])   . Chronic lymphocytic leukemia ([place])   . Coronary atherosclerosis of native coronary artery    Nonobstructive  . Essential hypertension, benign   . Hyperlipidemia   . IgA nephropathy    Dr. Lowanda Foster  . Type 2 diabetes mellitus ([place])   . UTI (urinary tract infection) july  10 th   taking  med     Patient Active Problem List   Diagnosis Date Noted  . Essential hypertension 09/10/2016  . 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 Cellulitis of heel, right 09/08/2016  . Hypoglycemia 06/21/2016  . Type 2 diabetes mellitus ([place])   . Chronic lymphocytic leukemia ([place])   . Encounter for therapeutic drug monitoring 05/16/2013  . Cardiac murmur 10/21/2011  . Long term current use of anticoagulant therapy 06/27/2010  . Mixed hyperlipidemia 05/21/2008  . CORONARY ATHEROSCLEROSIS NATIVE CORONARY ARTERY 05/21/2008  . Atrial fibrillation ([place]) 05/21/2008  . KNEE PAIN 06/14/2007    Past Surgical History:  Procedure Laterality Date  . AMPUTATION Left 09/15/2012   Procedure: PARTIAL AMPUTATION 3rd TOE LEFT FOOT;  Surgeon: Marcheta Grammes, DPM;  Location: [facility];  Service: Orthopedics;  Laterality: Left;  . AMPUTATION Left 04/20/2013   Procedure:  PARTIAL AMPUTATION 2ND TOE LEFT FOOT;  Surgeon: Marcheta Grammes, DPM;  Location: [facility];  Service: Orthopedics;  Laterality: Left;  . ANAL FISSURE REPAIR    . APPLICATION OF WOUND VAC Right 09/09/2016   Procedure: APPLICATION OF WOUND VAC;  Surgeon: Caprice Beaver, DPM;  Location: [facility];  Service: Podiatry;  Laterality: Right;  . CATARACT EXTRACTION W/PHACO Right 09/30/2015   Procedure: CATARACT EXTRACTION PHACO AND INTRAOCULAR LENS PLACEMENT (IOC);  Surgeon: Tonny Branch, MD;  Location: [facility];  Service: Ophthalmology;  Laterality: Right;  CDE: 11.78  . CATARACT EXTRACTION W/PHACO Left 10/31/2015   Procedure: CATARACT EXTRACTION PHACO AND INTRAOCULAR LENS PLACEMENT LEFT EYE; CDE:  9.10;  Surgeon: Tonny Branch, MD;  Location: [facility];  Service: Ophthalmology;  Laterality: Left;  . COLONOSCOPY  08/19/2011   Procedure: COLONOSCOPY;  Surgeon: Rogene Houston, MD;  Location: [facility];  Service: Endoscopy;  Laterality: N/A;  930  . COLONOSCOPY N/A 09/19/2014   Procedure: COLONOSCOPY;  Surgeon: Rogene Houston, MD;  [facility];  Service: Endoscopy;  Laterality: N/A;  1055  . FEMORAL HERNIA REPAIR    . INCISION AND DRAINAGE Right 09/09/2016   Procedure: DEBRIDEMENT WOUND RT heel with wound vac attachment;  Surgeon: Caprice Beaver, DPM;  Location: [facility];  Service: Podiatry;  Laterality: Right;  . INCISION AND DRAINAGE OF WOUND Right 08/12/2016   Procedure: DEBRIDEMENT WOUND RIGHT HEEL;  Surgeon: Caprice Beaver, DPM;  Location: [facility];  Service: Podiatry;  Laterality: Right;  right heel  . MASS EXCISION Right 09/28/2017   Procedure: EXCISION OF EXTERNAL AUDITORY CANAL MASS;  Surgeon: Leta Baptist, MD;  Location: [facility];  Service: ENT;  Laterality: Right;  . Open repair left quadriceps tendon.  08/22/07   Dr. Aline Brochure  . Right total knee replacement    . TRANSURETHRAL RESECTION OF PROSTATE    . WOUND EXPLORATION Right 08/12/2016   Procedure: EXPLORATION OF WOUND FOR  FOREIGN BODY RIGHT HEEL;  Surgeon: Caprice Beaver, DPM;  Location: [facility];  Service: Podiatry;  Laterality: Right;  right heel        Home Medications    Prior to Admission medications   Medication Sig Start Date End Date Taking? Authorizing Provider  Ascorbic Acid (VITAMIN C) 1000 MG tablet Take 1,000 mg by mouth daily with breakfast.    Yes [provider]  atorvastatin (LIPITOR) 10 MG tablet Take 10 mg by mouth every evening.   Yes [provider]  Calcium Carb-Cholecalciferol (CALCIUM 600+D) 600-800 MG-UNIT TABS Take 2 tablets by mouth daily with breakfast.   Yes [provider]  cholecalciferol (VITAMIN D) 1000 units tablet Take 1,000 Units by mouth every morning.   Yes [provider]  clindamycin (CLEOCIN) 300 MG capsule Take 1 capsule (300 mg total) by mouth 4 (four) times daily. X 7 days 10/16/17  Yes Ripley Fraise, MD  Coenzyme Q10 (CO Q-10) 100 MG CAPS Take 100 mg by mouth daily with breakfast.    Yes [provider]  digoxin (LANOXIN) 0.125 MG tablet Take 0.125 mg by mouth daily with breakfast.    Yes [provider]  finasteride (PROSCAR) 5 MG tablet Take 5 mg by mouth daily.   Yes [provider]  furosemide (LASIX) 20 MG tablet Take 2 pills a day 06/29/17  Yes Milton Ferguson, MD  glimepiride (AMARYL) 2 MG tablet Take 2 mg by mouth daily with breakfast.  06/11/16  Yes [provider]  HYDROcodone-acetaminophen (NORCO/VICODIN) 5-325 MG tablet Take 1 tablet by mouth every 6 (six) hours as needed for severe pain. 10/16/17  Yes Ripley Fraise, MD  insulin aspart (NOVOLOG FLEXPEN) 100 UNIT/ML FlexPen Pt uses per sliding scale before lunch and dinner.   Yes [provider]  Insulin Detemir (LEVEMIR FLEXPEN) 100 UNIT/ML Pen Inject 20 Units into the skin every morning.   Yes [provider]  lisinopril (PRINIVIL,ZESTRIL) 20 MG tablet Take 40 mg by mouth daily with supper. 07/07/16  Yes [provider]  LORazepam (ATIVAN) 1 MG tablet Take 1 tablet by mouth at bedtime.  04/28/17  Yes [provider]  Multiple Vitamin (MULTIVITAMIN WITH MINERALS) TABS tablet Take 1 tablet by mouth daily with breakfast.   Yes [provider]  pioglitazone (ACTOS) 15 MG tablet Take 15 mg by mouth every evening.    Yes [provider]  VICTOZA 18 MG/3ML SOPN Inject 1.8 mg into the skin daily with breakfast. 06/05/16  Yes [provider]  HYDROcodone-acetaminophen (NORCO/VICODIN) 5-325 MG tablet Take 1 tablet by mouth every 4 (four) hours as needed. 10/17/17   Nat Christen, MD  oxyCODONE-acetaminophen (PERCOCET/ROXICET) 5-325 MG tablet Take 1 tablet by mouth every 4 (four) hours as needed for severe pain. 09/28/17   Leta Baptist, MD  predniSONE (DELTASONE) 10 MG tablet Take 1 tablet (10 mg total) by mouth daily with breakfast. 2 tablets for 5 days, 1 tablet for 5 days, 1/2 tablet for  5 days. 10/17/17   Nat Christen, MD    Family History Family History  Problem Relation Age of Onset  . Atrial fibrillation Mother     Social History Social History   Tobacco Use  . Smoking status: Former Smoker    Years: 10.00    Types: Cigarettes    Last attempt to quit: 04/07/1963    Years since quitting: 54.5  . Smokeless tobacco: Never Used  Substance Use Topics  . Alcohol use: No    Alcohol/week: 0.0 oz  . Drug use: No     Allergies   Erythromycin and Penicillins   Review of Systems Review of Systems  All other systems reviewed and are negative.    Physical Exam Updated Vital Signs BP (!) 160/70 (BP Location: Left Arm)   Pulse 80   Temp 98 F (36.7 C) (Oral)   Resp 18   Ht 6' (1.829 m)   Wt 132 kg (291 lb)   SpO2 100%   BMI 39.47 kg/m   Physical Exam  Constitutional: He is oriented to person, place, and time. He appears well-developed and well-nourished.  HENT:  Head: Normocephalic and atraumatic.  Left tympanic membrane normal.  Right ear exam: Minimal  edema at 10:00 in external canal.  Membrane appears normal.  Eyes: Conjunctivae are normal.  Neck: Neck supple.  Cardiovascular: Normal rate and regular rhythm.  Pulmonary/Chest: Effort normal and breath sounds normal.  Abdominal: Soft. Bowel sounds are normal.  Musculoskeletal: Normal range of motion.  Neurological: He is alert and oriented to person, place, and time.  Slight numbness in right cheek.  Unable to close right eye completely.  No obvious facial drooping.  Skin: Skin is warm and dry.  Psychiatric: He has a normal mood and affect. His behavior is normal.  Nursing note and vitals reviewed.    ED Treatments / Results  Labs (all labs ordered are listed, but only abnormal results are displayed) Labs Reviewed - No data to display  EKG None  Radiology Ct Head Wo Contrast  Result Date: 10/16/2017 CLINICAL DATA:  Right-sided ear pain and headache for 2 weeks after surgery. EXAM: CT HEAD WITHOUT CONTRAST CT TEMPORAL BONES WITH CONTRAST TECHNIQUE: Contiguous axial images were obtained from the base of the skull through the vertex without contrast. Multidetector CT imaging of the temporal bones was performed using the standard protocol with intravenous contrast. CONTRAST: 75 mL Omnipaque 300 COMPARISON:  Head CT 06/02/2007 FINDINGS: CT HEAD FINDINGS Brain: There is no mass, hemorrhage or extra-axial collection. There is generalized atrophy without lobar predilection. There is no acute or chronic infarction. The brain parenchyma is normal. Vascular: No abnormal hyperdensity of the major intracranial arteries or dural venous sinuses. No intracranial atherosclerosis. Skull: The visualized skull base, calvarium and extracranial soft tissues are normal. Sinuses/Orbits: No fluid levels or advanced mucosal thickening of the visualized paranasal sinuses. The orbits are normal. CT TEMPORAL BONES FINDINGS RIGHT: --Pinna and external auditory canal: Normal. --Ossicular chain: Normal. No erosion or  dislocation. --Tympanic membrane: Normal. --Middle ear: Small amounts of intermediate density material within the middle ear adjacent to the tympanic membrane. --Epitympanum: Minimal opacification within the epitympanum. The scutum is sharp. Tegmen tympani is intact. --Cochlea, vestibule, vestibular aqueduct and semicircular canals: Normal. No evidence of canal dehiscence or otospongiosis. --Internal auditory canal: Normal. No widening of the porus acusticus. --Facial nerve: No focal abnormality along the course of the facial nerve. --Cerebellopontine angle: Normal. --Petrous Apex: Normal. --Mastoids: Partial opacification of the mastoid air  cells. No coalescence. --Carotid canal: Normal position. LEFT: --Pinna and external auditory canal: Normal. --Ossicular chain: Normal. No erosion or dislocation. --Tympanic membrane: Normal. --Middle ear: Normal. --Epitympanum: The Prussak space is clear. The scutum is sharp. Tegmen tympani is intact. --Cochlea, vestibule, vestibular aqueduct and semicircular canals: Normal. No evidence of canal dehiscence or otospongiosis. --Internal auditory canal: Normal. No widening of the porus acusticus. --Facial nerve: No focal abnormality along the course of the facial nerve. --Cerebellopontine angle: Normal. --Petrous Apex: Normal. --Mastoids: Normal. --Carotid canal: Normal position. OTHER: --Nasopharynx: Clear. --Temporomandibular joints: Normal. IMPRESSION: 1. Mild brain parenchymal volume loss without acute intracranial abnormality. 2. Small right mastoid effusion without coalescence. Minimal opacification of the middle ear. No acute findings. 3. Normal left temporal bone structures. Electronically Signed   By: Ulyses Jarred M.D.   On: 10/16/2017 03:45   Ct Temporal Bones W Contrast  Result Date: 10/16/2017 CLINICAL DATA:  Right-sided ear pain and headache for 2 weeks after surgery. EXAM: CT HEAD WITHOUT CONTRAST CT TEMPORAL BONES WITH CONTRAST TECHNIQUE: Contiguous axial images  were obtained from the base of the skull through the vertex without contrast. Multidetector CT imaging of the temporal bones was performed using the standard protocol with intravenous contrast. CONTRAST: 75 mL Omnipaque 300 COMPARISON:  Head CT 06/02/2007 FINDINGS: CT HEAD FINDINGS Brain: There is no mass, hemorrhage or extra-axial collection. There is generalized atrophy without lobar predilection. There is no acute or chronic infarction. The brain parenchyma is normal. Vascular: No abnormal hyperdensity of the major intracranial arteries or dural venous sinuses. No intracranial atherosclerosis. Skull: The visualized skull base, calvarium and extracranial soft tissues are normal. Sinuses/Orbits: No fluid levels or advanced mucosal thickening of the visualized paranasal sinuses. The orbits are normal. CT TEMPORAL BONES FINDINGS RIGHT: --Pinna and external auditory canal: Normal. --Ossicular chain: Normal. No erosion or dislocation. --Tympanic membrane: Normal. --Middle ear: Small amounts of intermediate density material within the middle ear adjacent to the tympanic membrane. --Epitympanum: Minimal opacification within the epitympanum. The scutum is sharp. Tegmen tympani is intact. --Cochlea, vestibule, vestibular aqueduct and semicircular canals: Normal. No evidence of canal dehiscence or otospongiosis. --Internal auditory canal: Normal. No widening of the porus acusticus. --Facial nerve: No focal abnormality along the course of the facial nerve. --Cerebellopontine angle: Normal. --Petrous Apex: Normal. --Mastoids: Partial opacification of the mastoid air cells. No coalescence. --Carotid canal: Normal position. LEFT: --Pinna and external auditory canal: Normal. --Ossicular chain: Normal. No erosion or dislocation. --Tympanic membrane: Normal. --Middle ear: Normal. --Epitympanum: The Prussak space is clear. The scutum is sharp. Tegmen tympani is intact. --Cochlea, vestibule, vestibular aqueduct and semicircular  canals: Normal. No evidence of canal dehiscence or otospongiosis. --Internal auditory canal: Normal. No widening of the porus acusticus. --Facial nerve: No focal abnormality along the course of the facial nerve. --Cerebellopontine angle: Normal. --Petrous Apex: Normal. --Mastoids: Normal. --Carotid canal: Normal position. OTHER: --Nasopharynx: Clear. --Temporomandibular joints: Normal. IMPRESSION: 1. Mild brain parenchymal volume loss without acute intracranial abnormality. 2. Small right mastoid effusion without coalescence. Minimal opacification of the middle ear. No acute findings. 3. Normal left temporal bone structures. Electronically Signed   By: Ulyses Jarred M.D.   On: 10/16/2017 03:45    Procedures Procedures (including critical care time)  Medications Ordered in ED Medications  predniSONE (DELTASONE) tablet 30 mg (30 mg Oral Given 10/17/17 1200)     Initial Impression / Assessment and Plan / ED Course  I have reviewed the triage vital signs and the nursing notes.  Pertinent labs &  imaging results that were available during my care of the patient were reviewed by me and considered in my medical decision making (see chart for details).     History and physical most consistent with Bell's palsy.  I see no evidence of a stroke or a worsening infection.  Will Rx prednisone and pain management.  Patient has primary care follow-up.  Final Clinical Impressions(s) / ED Diagnoses   Final diagnoses:  Bell's palsy    ED Discharge Orders        Ordered    HYDROcodone-acetaminophen (NORCO/VICODIN) 5-325 MG tablet  Every 4 hours PRN     10/17/17 1236    predniSONE (DELTASONE) 10 MG tablet  Daily with breakfast     10/17/17 1236       Nat Christen, MD 10/17/17 1551

## 2017-10-17 NOTE — ED Triage Notes (Signed)
Pt reports right sided facial droop with right eye not closing since 930 last night.  No other deficits.  Pt has been having trouble with right ear.

## 2017-10-17 NOTE — Discharge Instructions (Addendum)
I suspect that you have Bell's palsy.  Prescription for prednisone and pain medicine.  Follow-up with your ear nose and throat doctor.

## 2017-10-18 ENCOUNTER — Ambulatory Visit (INDEPENDENT_AMBULATORY_CARE_PROVIDER_SITE_OTHER): Payer: Medicare Other | Admitting: Otolaryngology

## 2017-10-18 ENCOUNTER — Other Ambulatory Visit (INDEPENDENT_AMBULATORY_CARE_PROVIDER_SITE_OTHER): Payer: Self-pay | Admitting: Otolaryngology

## 2017-10-18 DIAGNOSIS — G519 Disorder of facial nerve, unspecified: Secondary | ICD-10-CM

## 2017-10-18 DIAGNOSIS — G510 Bell's palsy: Secondary | ICD-10-CM

## 2017-10-18 DIAGNOSIS — H9201 Otalgia, right ear: Secondary | ICD-10-CM | POA: Diagnosis not present

## 2017-10-21 DIAGNOSIS — I4891 Unspecified atrial fibrillation: Secondary | ICD-10-CM | POA: Diagnosis not present

## 2017-10-21 DIAGNOSIS — E119 Type 2 diabetes mellitus without complications: Secondary | ICD-10-CM | POA: Diagnosis not present

## 2017-10-21 DIAGNOSIS — I10 Essential (primary) hypertension: Secondary | ICD-10-CM | POA: Diagnosis not present

## 2017-10-26 ENCOUNTER — Ambulatory Visit (HOSPITAL_COMMUNITY)
Admission: RE | Admit: 2017-10-26 | Discharge: 2017-10-26 | Disposition: A | Discharge status: Home / Self Care | Payer: Medicare Other | Source: Ambulatory Visit | Attending: Otolaryngology | Admitting: Otolaryngology

## 2017-10-26 ENCOUNTER — Ambulatory Visit (INDEPENDENT_AMBULATORY_CARE_PROVIDER_SITE_OTHER): Payer: Medicare Other | Admitting: *Deleted

## 2017-10-26 DIAGNOSIS — Z5181 Encounter for therapeutic drug level monitoring: Secondary | ICD-10-CM

## 2017-10-26 DIAGNOSIS — Z7901 Long term (current) use of anticoagulants: Secondary | ICD-10-CM

## 2017-10-26 DIAGNOSIS — R29810 Facial weakness: Secondary | ICD-10-CM | POA: Diagnosis not present

## 2017-10-26 DIAGNOSIS — H6691 Otitis media, unspecified, right ear: Secondary | ICD-10-CM | POA: Insufficient documentation

## 2017-10-26 DIAGNOSIS — I4891 Unspecified atrial fibrillation: Secondary | ICD-10-CM

## 2017-10-26 DIAGNOSIS — G510 Bell's palsy: Secondary | ICD-10-CM | POA: Insufficient documentation

## 2017-10-26 DIAGNOSIS — H7091 Unspecified mastoiditis, right ear: Secondary | ICD-10-CM | POA: Insufficient documentation

## 2017-10-26 LAB — POCT INR: INR: 3.3 — AB (ref 2.0–3.0)

## 2017-10-26 MED ORDER — GADOBENATE DIMEGLUMINE 529 MG/ML IV SOLN
20.0000 mL | Freq: Once | INTRAVENOUS | Status: AC | PRN
  Administered 2017-10-26: 20 mL via INTRAVENOUS

## 2017-10-26 NOTE — Patient Instructions (Signed)
Hold coumadin tonight then resume 1 1/2 tablets daily except 1 tablet on Tuesdays and Fridays Recheck in 2 weeks

## 2017-11-04 ENCOUNTER — Other Ambulatory Visit: Payer: Self-pay

## 2017-11-04 ENCOUNTER — Inpatient Hospital Stay (HOSPITAL_COMMUNITY)
Admission: AD | Admit: 2017-11-04 | Discharge: 2017-11-09 | DRG: 153 | Disposition: A | Discharge status: Home / Self Care | Payer: Medicare Other | Source: Ambulatory Visit | Attending: Internal Medicine | Admitting: Internal Medicine

## 2017-11-04 ENCOUNTER — Encounter (HOSPITAL_COMMUNITY): Payer: Self-pay

## 2017-11-04 ENCOUNTER — Ambulatory Visit (INDEPENDENT_AMBULATORY_CARE_PROVIDER_SITE_OTHER): Payer: Medicare Other | Admitting: Otolaryngology

## 2017-11-04 DIAGNOSIS — E785 Hyperlipidemia, unspecified: Secondary | ICD-10-CM | POA: Diagnosis present

## 2017-11-04 DIAGNOSIS — Z96651 Presence of right artificial knee joint: Secondary | ICD-10-CM | POA: Diagnosis present

## 2017-11-04 DIAGNOSIS — E1165 Type 2 diabetes mellitus with hyperglycemia: Secondary | ICD-10-CM | POA: Diagnosis not present

## 2017-11-04 DIAGNOSIS — I2510 Atherosclerotic heart disease of native coronary artery without angina pectoris: Secondary | ICD-10-CM | POA: Diagnosis present

## 2017-11-04 DIAGNOSIS — Z880 Allergy status to penicillin: Secondary | ICD-10-CM

## 2017-11-04 DIAGNOSIS — E0865 Diabetes mellitus due to underlying condition with hyperglycemia: Secondary | ICD-10-CM

## 2017-11-04 DIAGNOSIS — H70001 Acute mastoiditis without complications, right ear: Principal | ICD-10-CM | POA: Diagnosis present

## 2017-11-04 DIAGNOSIS — E119 Type 2 diabetes mellitus without complications: Secondary | ICD-10-CM

## 2017-11-04 DIAGNOSIS — I482 Chronic atrial fibrillation, unspecified: Secondary | ICD-10-CM

## 2017-11-04 DIAGNOSIS — H6021 Malignant otitis externa, right ear: Secondary | ICD-10-CM

## 2017-11-04 DIAGNOSIS — G519 Disorder of facial nerve, unspecified: Secondary | ICD-10-CM | POA: Diagnosis not present

## 2017-11-04 DIAGNOSIS — Z794 Long term (current) use of insulin: Secondary | ICD-10-CM

## 2017-11-04 DIAGNOSIS — Z79899 Other long term (current) drug therapy: Secondary | ICD-10-CM

## 2017-11-04 DIAGNOSIS — H6691 Otitis media, unspecified, right ear: Secondary | ICD-10-CM | POA: Diagnosis present

## 2017-11-04 DIAGNOSIS — H70009 Acute mastoiditis without complications, unspecified ear: Secondary | ICD-10-CM

## 2017-11-04 DIAGNOSIS — I5032 Chronic diastolic (congestive) heart failure: Secondary | ICD-10-CM | POA: Diagnosis present

## 2017-11-04 DIAGNOSIS — Z87891 Personal history of nicotine dependence: Secondary | ICD-10-CM

## 2017-11-04 DIAGNOSIS — H7090 Unspecified mastoiditis, unspecified ear: Secondary | ICD-10-CM | POA: Diagnosis present

## 2017-11-04 DIAGNOSIS — Z89422 Acquired absence of other left toe(s): Secondary | ICD-10-CM

## 2017-11-04 DIAGNOSIS — G510 Bell's palsy: Secondary | ICD-10-CM | POA: Diagnosis not present

## 2017-11-04 DIAGNOSIS — C9110 Chronic lymphocytic leukemia of B-cell type not having achieved remission: Secondary | ICD-10-CM | POA: Diagnosis present

## 2017-11-04 DIAGNOSIS — I4891 Unspecified atrial fibrillation: Secondary | ICD-10-CM | POA: Diagnosis present

## 2017-11-04 DIAGNOSIS — I110 Hypertensive heart disease with heart failure: Secondary | ICD-10-CM | POA: Diagnosis present

## 2017-11-04 DIAGNOSIS — H7091 Unspecified mastoiditis, right ear: Secondary | ICD-10-CM

## 2017-11-04 DIAGNOSIS — C9190 Lymphoid leukemia, unspecified not having achieved remission: Secondary | ICD-10-CM | POA: Diagnosis not present

## 2017-11-04 DIAGNOSIS — Z856 Personal history of leukemia: Secondary | ICD-10-CM

## 2017-11-04 DIAGNOSIS — Z888 Allergy status to other drugs, medicaments and biological substances status: Secondary | ICD-10-CM

## 2017-11-04 DIAGNOSIS — I10 Essential (primary) hypertension: Secondary | ICD-10-CM | POA: Diagnosis not present

## 2017-11-04 DIAGNOSIS — G518 Other disorders of facial nerve: Secondary | ICD-10-CM

## 2017-11-04 DIAGNOSIS — H6020 Malignant otitis externa, unspecified ear: Secondary | ICD-10-CM | POA: Diagnosis present

## 2017-11-04 DIAGNOSIS — H7011 Chronic mastoiditis, right ear: Secondary | ICD-10-CM

## 2017-11-04 LAB — COMPREHENSIVE METABOLIC PANEL
ALT: 20 U/L (ref 0–44)
AST: 18 U/L (ref 15–41)
Albumin: 3.5 g/dL (ref 3.5–5.0)
Alkaline Phosphatase: 86 U/L (ref 38–126)
Anion gap: 8 (ref 5–15)
BILIRUBIN TOTAL: 1.3 mg/dL — AB (ref 0.3–1.2)
BUN: 28 mg/dL — AB (ref 8–23)
CHLORIDE: 100 mmol/L (ref 98–111)
CO2: 28 mmol/L (ref 22–32)
CREATININE: 0.97 mg/dL (ref 0.61–1.24)
Calcium: 9.8 mg/dL (ref 8.9–10.3)
Glucose, Bld: 140 mg/dL — ABNORMAL HIGH (ref 70–99)
POTASSIUM: 4.1 mmol/L (ref 3.5–5.1)
Sodium: 136 mmol/L (ref 135–145)
TOTAL PROTEIN: 6.1 g/dL — AB (ref 6.5–8.1)

## 2017-11-04 LAB — CBC WITH DIFFERENTIAL/PLATELET
BASOS ABS: 0 10*3/uL (ref 0.0–0.1)
Basophils Relative: 0 %
EOS ABS: 0.2 10*3/uL (ref 0.0–0.7)
EOS PCT: 2 %
HCT: 43.3 % (ref 39.0–52.0)
HEMOGLOBIN: 14.8 g/dL (ref 13.0–17.0)
Lymphocytes Relative: 58 %
Lymphs Abs: 7 10*3/uL (ref 0.7–4.0)
MCH: 29.5 pg (ref 26.0–34.0)
MCHC: 34.2 g/dL (ref 30.0–36.0)
MCV: 86.4 fL (ref 78.0–100.0)
Monocytes Absolute: 1.3 10*3/uL (ref 0.1–1.0)
Monocytes Relative: 10 %
NEUTROS PCT: 30 %
Neutro Abs: 3.6 10*3/uL (ref 1.7–7.7)
PLATELETS: 165 10*3/uL (ref 150–400)
RBC: 5.01 MIL/uL (ref 4.22–5.81)
RDW: 15.3 % (ref 11.5–15.5)
WBC: 12.1 10*3/uL — AB (ref 4.0–10.5)

## 2017-11-04 LAB — GLUCOSE, CAPILLARY
GLUCOSE-CAPILLARY: 170 mg/dL — AB (ref 70–99)
GLUCOSE-CAPILLARY: 134 mg/dL — AB (ref 70–99)

## 2017-11-04 LAB — PROTIME-INR
INR: 2.62
Prothrombin Time: 27.8 seconds — ABNORMAL HIGH (ref 11.4–15.2)

## 2017-11-04 MED ORDER — SODIUM CHLORIDE 0.9% FLUSH
3.0000 mL | Freq: Two times a day (BID) | INTRAVENOUS | Status: DC
  Administered 2017-11-04 – 2017-11-09 (×6): 3 mL via INTRAVENOUS

## 2017-11-04 MED ORDER — SODIUM CHLORIDE 0.9 % IV SOLN
2.0000 g | INTRAVENOUS | Status: DC
  Administered 2017-11-04 – 2017-11-08 (×5): 2 g via INTRAVENOUS
  Filled 2017-11-04 (×2): qty 20
  Filled 2017-11-04: qty 2
  Filled 2017-11-04: qty 20
  Filled 2017-11-04 (×2): qty 2
  Filled 2017-11-04 (×3): qty 20

## 2017-11-04 MED ORDER — SODIUM CHLORIDE 0.9% FLUSH: 3.0000 mL | INTRAVENOUS | Status: DC | PRN

## 2017-11-04 MED ORDER — VITAMIN C 500 MG PO TABS
1000.0000 mg | ORAL_TABLET | Freq: Every day | ORAL | Status: DC
  Administered 2017-11-05 – 2017-11-09 (×5): 1000 mg via ORAL
  Filled 2017-11-04 (×5): qty 2

## 2017-11-04 MED ORDER — ONDANSETRON HCL 4 MG/2ML IJ SOLN
4.0000 mg | Freq: Four times a day (QID) | INTRAMUSCULAR | Status: DC | PRN
Start: 2017-11-04 — End: 2017-11-09

## 2017-11-04 MED ORDER — SODIUM CHLORIDE 0.9 % IV SOLN: 250.0000 mL | INTRAVENOUS | Status: DC | PRN

## 2017-11-04 MED ORDER — DIGOXIN 125 MCG PO TABS
0.1250 mg | ORAL_TABLET | Freq: Every day | ORAL | Status: DC
  Administered 2017-11-05 – 2017-11-09 (×5): 0.125 mg via ORAL
  Filled 2017-11-04 (×5): qty 1

## 2017-11-04 MED ORDER — LISINOPRIL 10 MG PO TABS
40.0000 mg | ORAL_TABLET | Freq: Every day | ORAL | Status: DC
  Administered 2017-11-04 – 2017-11-08 (×5): 40 mg via ORAL
  Filled 2017-11-04 (×5): qty 4

## 2017-11-04 MED ORDER — ONDANSETRON HCL 4 MG PO TABS: 4.0000 mg | ORAL_TABLET | Freq: Four times a day (QID) | ORAL | Status: DC | PRN

## 2017-11-04 MED ORDER — ACETAMINOPHEN 650 MG RE SUPP: 650.0000 mg | Freq: Four times a day (QID) | RECTAL | Status: DC | PRN

## 2017-11-04 MED ORDER — ATORVASTATIN CALCIUM 10 MG PO TABS
10.0000 mg | ORAL_TABLET | Freq: Every evening | ORAL | Status: DC
  Administered 2017-11-04 – 2017-11-08 (×5): 10 mg via ORAL
  Filled 2017-11-04 (×5): qty 1

## 2017-11-04 MED ORDER — TRAMADOL HCL 50 MG PO TABS
50.0000 mg | ORAL_TABLET | Freq: Four times a day (QID) | ORAL | Status: DC | PRN
  Filled 2017-11-04: qty 1

## 2017-11-04 MED ORDER — LORAZEPAM 1 MG PO TABS
1.0000 mg | ORAL_TABLET | Freq: Every evening | ORAL | Status: DC | PRN
Start: 2017-11-04 — End: 2017-11-09
  Administered 2017-11-06 – 2017-11-08 (×2): 1 mg via ORAL
  Filled 2017-11-04 (×2): qty 1

## 2017-11-04 MED ORDER — ADULT MULTIVITAMIN W/MINERALS CH
1.0000 | ORAL_TABLET | Freq: Every day | ORAL | Status: DC
  Administered 2017-11-05 – 2017-11-09 (×5): 1 via ORAL
  Filled 2017-11-04 (×5): qty 1

## 2017-11-04 MED ORDER — SODIUM CHLORIDE 0.9 % IV SOLN
250.0000 mL | INTRAVENOUS | Status: AC | PRN
  Administered 2017-11-04: 250 mL via INTRAVENOUS

## 2017-11-04 MED ORDER — METRONIDAZOLE IN NACL 5-0.79 MG/ML-% IV SOLN
500.0000 mg | Freq: Three times a day (TID) | INTRAVENOUS | Status: DC
  Administered 2017-11-04 – 2017-11-09 (×14): 500 mg via INTRAVENOUS
  Filled 2017-11-04 (×14): qty 100

## 2017-11-04 MED ORDER — FINASTERIDE 5 MG PO TABS
5.0000 mg | ORAL_TABLET | Freq: Every day | ORAL | Status: DC
  Administered 2017-11-05 – 2017-11-09 (×5): 5 mg via ORAL
  Filled 2017-11-04 (×7): qty 1

## 2017-11-04 MED ORDER — INSULIN DETEMIR 100 UNIT/ML FLEXPEN: 20.0000 [IU] | PEN_INJECTOR | Freq: Every morning | SUBCUTANEOUS | Status: DC

## 2017-11-04 MED ORDER — INSULIN ASPART 100 UNIT/ML ~~LOC~~ SOLN
0.0000 [IU] | Freq: Three times a day (TID) | SUBCUTANEOUS | Status: DC
  Administered 2017-11-05: 7 [IU] via SUBCUTANEOUS
  Administered 2017-11-05: 4 [IU] via SUBCUTANEOUS
  Administered 2017-11-05: 15 [IU] via SUBCUTANEOUS
  Administered 2017-11-06: 7 [IU] via SUBCUTANEOUS
  Administered 2017-11-06: 15 [IU] via SUBCUTANEOUS
  Administered 2017-11-06 – 2017-11-07 (×2): 7 [IU] via SUBCUTANEOUS
  Administered 2017-11-07: 11 [IU] via SUBCUTANEOUS
  Administered 2017-11-07: 3 [IU] via SUBCUTANEOUS
  Administered 2017-11-08 (×2): 4 [IU] via SUBCUTANEOUS
  Administered 2017-11-08 – 2017-11-09 (×2): 7 [IU] via SUBCUTANEOUS

## 2017-11-04 MED ORDER — HYDROCODONE-ACETAMINOPHEN 5-325 MG PO TABS: 1.0000 | ORAL_TABLET | Freq: Four times a day (QID) | ORAL | Status: DC | PRN

## 2017-11-04 MED ORDER — ACETAMINOPHEN 325 MG PO TABS: 650.0000 mg | ORAL_TABLET | Freq: Four times a day (QID) | ORAL | Status: DC | PRN

## 2017-11-04 MED ORDER — PIOGLITAZONE HCL 15 MG PO TABS
15.0000 mg | ORAL_TABLET | Freq: Every evening | ORAL | Status: DC
  Administered 2017-11-04 – 2017-11-05 (×2): 15 mg via ORAL
  Filled 2017-11-04 (×4): qty 1

## 2017-11-04 MED ORDER — GLIMEPIRIDE 2 MG PO TABS
2.0000 mg | ORAL_TABLET | Freq: Every day | ORAL | Status: DC
  Administered 2017-11-05 – 2017-11-06 (×2): 2 mg via ORAL
  Filled 2017-11-04 (×2): qty 1

## 2017-11-04 MED ORDER — INSULIN ASPART 100 UNIT/ML ~~LOC~~ SOLN: 0.0000 [IU] | Freq: Every day | SUBCUTANEOUS | Status: DC

## 2017-11-04 MED ORDER — INSULIN DETEMIR 100 UNIT/ML ~~LOC~~ SOLN
20.0000 [IU] | Freq: Every morning | SUBCUTANEOUS | Status: DC
  Administered 2017-11-05 – 2017-11-06 (×2): 20 [IU] via SUBCUTANEOUS
  Filled 2017-11-04 (×5): qty 0.2

## 2017-11-04 NOTE — Progress Notes (Deleted)
Patient arrived in the floor. Wife at bedside. Patient has eye patch on right eye with glasses. Patient has facial droop on the left side, wife states that "this happen a week ago last Sunday". Wife also states that the patient has surgery scheduled for Wednesday. Patient is stable and we are awaiting doctor visit and orders. Will continue to monitor.

## 2017-11-04 NOTE — H&P (Addendum)
[facility] History and Physical  [name] [name] ZDG:[id number] DOB: 20-Jul-1934 DOA: 11/04/2017  Referring physician: Dr. Benjamine Mola PCP: [name], MD   Chief Complaint: Mastoiditis patient needs admit for IV abx  HPI: [name] [name] is a 83 y.o. male with hx of DM2, UTI, IgA neph, HTN, afib , CLL and CAD direct admit from Dr Deeann Saint ENT office for mastoiditis/ OM w/ facial nerve palsy failing outpt abx.    Per pt's wife the recent history as follows:    Sep 28 2017 - has cyst removal from the R ear  10/04/17 - returned for removal of the packing  10/18/17 - throbbing pain in R ear, went to ED , CT scan done, went home  10/19/17 - returned to ED w/ R facial droop, "Bell's palsy" diagnosed and started on po abx and prednisone taper   11/04/17 - seen in Dr Deeann Saint office today, not getting better on oral Rx , plan from hosp admit for IV abx , let coumadin wear off and do surgery (mastoidectomy) next week   R ear pain is tolerable w/o narcotic pain meds as of last week now.  No n/v/d, no abd pain, no CP , no fevers, no chills, using eyedrops to keep R eye moist and gauze cloth.  No difficulty walking but just short amounts at home.    Also last year per the wife he went through the following :  march 2018 - UTI , 6 days abx in the hospital  June 2018 - R heel ulcer and local I&D in ED then sent to wound center, got worse, admitted for IV abx 6/5 - 09/11/17 and sent home for 6 wks IV Rocephin w/ PICC line for ostemyelitis of the heel   ROS  denies CP  no joint pain   no HA  no blurry vision  no rash  no diarrhea  no nausea/ vomiting  no dysuria  no difficulty voiding  no change in urine color    Past Medical History  Past Medical History:  Diagnosis Date  . Atrial fibrillation ([place])   . Chronic lymphocytic leukemia ([place])   . Coronary atherosclerosis of native coronary artery    Nonobstructive  . Essential hypertension, benign   . Hyperlipidemia   . IgA nephropathy    Dr. Lowanda Foster   . Type 2 diabetes mellitus ([place])   . UTI (urinary tract infection) july  10 th   taking  med    Past Surgical History  Past Surgical History:  Procedure Laterality Date  . AMPUTATION Left 09/15/2012   Procedure: PARTIAL AMPUTATION 3rd TOE LEFT FOOT;  Surgeon: Marcheta Grammes, DPM;  Location: [facility];  Service: Orthopedics;  Laterality: Left;  . AMPUTATION Left 04/20/2013   Procedure: PARTIAL AMPUTATION 2ND TOE LEFT FOOT;  Surgeon: Marcheta Grammes, DPM;  Location: [facility];  Service: Orthopedics;  Laterality: Left;  . ANAL FISSURE REPAIR    . APPLICATION OF WOUND VAC Right 09/09/2016   Procedure: APPLICATION OF WOUND VAC;  Surgeon: Caprice Beaver, DPM;  Location: [facility];  Service: Podiatry;  Laterality: Right;  . CATARACT EXTRACTION W/PHACO Right 09/30/2015   Procedure: CATARACT EXTRACTION PHACO AND INTRAOCULAR LENS PLACEMENT (IOC);  Surgeon: Tonny Branch, MD;  Location: [facility];  Service: Ophthalmology;  Laterality: Right;  CDE: 11.78  . CATARACT EXTRACTION W/PHACO Left 10/31/2015   Procedure: CATARACT EXTRACTION PHACO AND INTRAOCULAR LENS PLACEMENT LEFT EYE; CDE:  9.10;  Surgeon: Tonny Branch, MD;  Location: [facility];  Service: Ophthalmology;  Laterality: Left;  . COLONOSCOPY  08/19/2011   Procedure: COLONOSCOPY;  Surgeon: Rogene Houston, MD;  [facility];  Service: Endoscopy;  Laterality: N/A;  930  . COLONOSCOPY N/A 09/19/2014   Procedure: COLONOSCOPY;  Surgeon: Rogene Houston, MD;  [facility];  Service: Endoscopy;  Laterality: N/A;  1055  . FEMORAL HERNIA REPAIR    . INCISION AND DRAINAGE Right 09/09/2016   Procedure: DEBRIDEMENT WOUND RT heel with wound vac attachment;  Surgeon: Caprice Beaver, DPM;  Location: [facility];  Service: Podiatry;  Laterality: Right;  . INCISION AND DRAINAGE OF WOUND Right 08/12/2016   Procedure: DEBRIDEMENT WOUND RIGHT HEEL;  Surgeon: Caprice Beaver, DPM;  Location: [facility];  Service: Podiatry;  Laterality: Right;  right heel  .  MASS EXCISION Right 09/28/2017   Procedure: EXCISION OF EXTERNAL AUDITORY CANAL MASS;  Surgeon: Leta Baptist, MD;  Location: [facility];  Service: ENT;  Laterality: Right;  . Open repair left quadriceps tendon.  08/22/07   Dr. Aline Brochure  . Right total knee replacement    . TRANSURETHRAL RESECTION OF PROSTATE    . WOUND EXPLORATION Right 08/12/2016   Procedure: EXPLORATION OF WOUND FOR FOREIGN BODY RIGHT HEEL;  Surgeon: Caprice Beaver, DPM;  Location: [facility];  Service: Podiatry;  Laterality: Right;  right heel   Family History  Family History  Problem Relation Age of Onset  . Atrial fibrillation Mother    Social History  reports that he quit smoking about 54 years ago. His smoking use included cigarettes. He quit after 10.00 years of use. He has never used smokeless tobacco. He reports that he does not drink alcohol or use drugs. Allergies  Allergies  Allergen Reactions  . Erythromycin Other (See Comments)  . Penicillins Hives and Other (See Comments)    Has patient had a PCN reaction causing immediate rash, facial/tongue/throat swelling, SOB or lightheadedness with hypotension: No Has patient had a PCN reaction causing severe rash involving mucus membranes or skin necrosis: No Has patient had a PCN reaction that required hospitalization No Has patient had a PCN reaction occurring within the last 10 years: No If all of the above answers are "NO", then may proceed with Cephalosporin use.  Has tolerated Rocephin   Home medications Prior to Admission medications   Medication Sig Start Date End Date Taking? Authorizing Provider  Ascorbic Acid (VITAMIN C) 1000 MG tablet Take 1,000 mg by mouth daily with breakfast.     [provider]  atorvastatin (LIPITOR) 10 MG tablet Take 10 mg by mouth every evening.    [provider]  Calcium Carb-Cholecalciferol (CALCIUM 600+D) 600-800 MG-UNIT TABS Take 2 tablets by mouth daily with breakfast.    [provider]   cholecalciferol (VITAMIN D) 1000 units tablet Take 1,000 Units by mouth every morning.    [provider]  clindamycin (CLEOCIN) 300 MG capsule Take 1 capsule (300 mg total) by mouth 4 (four) times daily. X 7 days 10/16/17   Ripley Fraise, MD  Coenzyme Q10 (CO Q-10) 100 MG CAPS Take 100 mg by mouth daily with breakfast.     [provider]  digoxin (LANOXIN) 0.125 MG tablet Take 0.125 mg by mouth daily with breakfast.     [provider]  finasteride (PROSCAR) 5 MG tablet Take 5 mg by mouth daily.    [provider]  furosemide (LASIX) 20 MG tablet Take 2 pills a day 06/29/17   Milton Ferguson,  MD  glimepiride (AMARYL) 2 MG tablet Take 2 mg by mouth daily with breakfast.  06/11/16   [provider]  HYDROcodone-acetaminophen (NORCO/VICODIN) 5-325 MG tablet Take 1 tablet by mouth every 6 (six) hours as needed for severe pain. 10/16/17   Ripley Fraise, MD  HYDROcodone-acetaminophen (NORCO/VICODIN) 5-325 MG tablet Take 1 tablet by mouth every 4 (four) hours as needed. 10/17/17   Nat Christen, MD  insulin aspart (NOVOLOG FLEXPEN) 100 UNIT/ML FlexPen Pt uses per sliding scale before lunch and dinner.    [provider]  Insulin Detemir (LEVEMIR FLEXPEN) 100 UNIT/ML Pen Inject 20 Units into the skin every morning.    [provider]  lisinopril (PRINIVIL,ZESTRIL) 20 MG tablet Take 40 mg by mouth daily with supper. 07/07/16   [provider]  LORazepam (ATIVAN) 1 MG tablet Take 1 tablet by mouth at bedtime.  04/28/17   [provider]  Multiple Vitamin (MULTIVITAMIN WITH MINERALS) TABS tablet Take 1 tablet by mouth daily with breakfast.    [provider]  oxyCODONE-acetaminophen (PERCOCET/ROXICET) 5-325 MG tablet Take 1 tablet by mouth every 4 (four) hours as needed for severe pain. 09/28/17   Leta Baptist, MD  pioglitazone (ACTOS) 15 MG tablet Take 15 mg by mouth every evening.     [provider]  predniSONE  (DELTASONE) 10 MG tablet Take 1 tablet (10 mg total) by mouth daily with breakfast. 2 tablets for 5 days, 1 tablet for 5 days, 1/2 tablet for 5 days. 10/17/17   Nat Christen, MD  valACYclovir (VALTREX) 1000 MG tablet Take 1,000 mg by mouth 2 (two) times daily. for 7 days 10/17/17   [provider]  VICTOZA 18 MG/3ML SOPN Inject 1.8 mg into the skin daily with breakfast. 06/05/16   [provider]   Liver Function Tests No results for input(s): AST, ALT, ALKPHOS, BILITOT, PROT, ALBUMIN in the last 168 hours. No results for input(s): LIPASE, AMYLASE in the last 168 hours. CBC No results for input(s): WBC, NEUTROABS, HGB, HCT, MCV, PLT in the last 168 hours. Basic Metabolic Panel No results for input(s): NA, K, CL, CO2, GLUCOSE, BUN, CREATININE, CALCIUM, PHOS in the last 168 hours.  Invalid input(s): ALB   Vitals:   11/04/17 1723 11/04/17 1730 11/04/17 1732  BP:   (!) 124/54  Pulse:   81  Resp:   18  Temp:   98.4 F (36.9 C)  TempSrc:   Oral  SpO2:  96% 95%  Weight: 121.8 kg (268 lb 9 oz)    Height: 6' (1.829 m)     Exam: Gen alert, R eye gauze in place, no distress, calm No rash, cyanosis or gangrene, R eye w/ cotton plug Sclera anicteric, throat clear  No jvd or bruits Chest clear bilat RRR no MRG Abd soft ntnd no mass or ascites +bs obese GU normal male MS no joint effusions or deformity Ext no LE or UE edema / no wounds or ulcers Neuro is alert, Ox 3 , nf    Home meds: - levemir 20 u qam/ novolog flexpen bid ac - pioglitazone 15 mg hs/ victoza 1.8 mg sq qd/ glimepiride 2 mg qam - lasix 20 bid/ lisinopril 40 hs/ digoxin 0.125 mg qam - atorvastatin 10 hs/ norco prn/ ativan prn/ mvi/ percocet prn - vitamins - pred taper/ valtrex/ cleocin -not sure taking these   Labs not drawn yet   Assessment: 1) R ear pain/  R Otitis media complicated by mastoiditis and R facial nerve  palsy - not responding to oral abx.  Plan admit, due to mild PCN allergy will give  IV Rocephin/ Flagyl (per ID phone rec).  Hold coumadin, bridge when needed w/ pharm assist. Get admit labs.  Dr Benjamine Mola will follow.   2)  DM2 - poorly controlled, recent prednisone course; cont home levemir and oral agents + SSI   3)  HTN - cont lisinopril and hold lasix for now  4)  Afib - hold coumadin, daily INR, cont digoxin; consult pharm, start IV hep w/ INR < 2  5)  Hx R heel osteomyelitis - June 2018, treated   Plan - as above    [place] [facility] Pager [phone]   If 7PM-7AM, please contact night-coverage [email or web address] Password [facility] 11/04/2017, 7:41 PM

## 2017-11-04 NOTE — Progress Notes (Signed)
Patient arrived in the floor. Wife at bedside. Patient has eye patch on right eye with glasses. Patient has facial droop on the right side, wife states that "this happen a week ago last Sunday". Wife also states that the patient has surgery scheduled for Wednesday. Patient is stable and we are awaiting doctor visit and orders. Will continue to monitor.

## 2017-11-05 ENCOUNTER — Other Ambulatory Visit: Payer: Self-pay | Admitting: Otolaryngology

## 2017-11-05 DIAGNOSIS — H70001 Acute mastoiditis without complications, right ear: Principal | ICD-10-CM

## 2017-11-05 DIAGNOSIS — I482 Chronic atrial fibrillation, unspecified: Secondary | ICD-10-CM

## 2017-11-05 DIAGNOSIS — I5032 Chronic diastolic (congestive) heart failure: Secondary | ICD-10-CM

## 2017-11-05 DIAGNOSIS — C9190 Lymphoid leukemia, unspecified not having achieved remission: Secondary | ICD-10-CM

## 2017-11-05 DIAGNOSIS — I10 Essential (primary) hypertension: Secondary | ICD-10-CM

## 2017-11-05 DIAGNOSIS — G519 Disorder of facial nerve, unspecified: Secondary | ICD-10-CM

## 2017-11-05 DIAGNOSIS — G510 Bell's palsy: Secondary | ICD-10-CM

## 2017-11-05 DIAGNOSIS — H70009 Acute mastoiditis without complications, unspecified ear: Secondary | ICD-10-CM

## 2017-11-05 LAB — GLUCOSE, CAPILLARY
GLUCOSE-CAPILLARY: 307 mg/dL — AB (ref 70–99)
GLUCOSE-CAPILLARY: 180 mg/dL — AB (ref 70–99)
Glucose-Capillary: 248 mg/dL — ABNORMAL HIGH (ref 70–99)
Glucose-Capillary: 167 mg/dL — ABNORMAL HIGH (ref 70–99)

## 2017-11-05 LAB — PROTIME-INR
INR: 2.34
PROTHROMBIN TIME: 25.4 s — AB (ref 11.4–15.2)

## 2017-11-05 MED ORDER — VANCOMYCIN HCL 10 G IV SOLR
2000.0000 mg | Freq: Once | INTRAVENOUS | Status: AC
  Administered 2017-11-05: 2000 mg via INTRAVENOUS
  Filled 2017-11-05: qty 2000

## 2017-11-05 MED ORDER — MAGNESIUM HYDROXIDE 400 MG/5ML PO SUSP
15.0000 mL | Freq: Every day | ORAL | Status: DC | PRN
  Administered 2017-11-05: 15 mL via ORAL
  Filled 2017-11-05: qty 30

## 2017-11-05 MED ORDER — VANCOMYCIN HCL IN DEXTROSE 1-5 GM/200ML-% IV SOLN
1000.0000 mg | Freq: Two times a day (BID) | INTRAVENOUS | Status: DC
  Administered 2017-11-06 – 2017-11-07 (×4): 1000 mg via INTRAVENOUS
  Filled 2017-11-05 (×4): qty 200

## 2017-11-05 NOTE — Care Management Important Message (Signed)
Important Message  Patient Details  Name: ORACIO GALEN MRN: [id number] Date of Birth: 06-11-1934   Medicare Important Message Given:  Yes    [name] 11/05/2017, 12:33 PM

## 2017-11-05 NOTE — Progress Notes (Signed)
ANTICOAGULATION CONSULT NOTE - Initial Consult  Pharmacy Consult for warfarin/heparin Indication: atrial fibrillation  Allergies  Allergen Reactions  . Erythromycin Other (See Comments)  . Penicillins Hives and Other (See Comments)    Has patient had a PCN reaction causing immediate rash, facial/tongue/throat swelling, SOB or lightheadedness with hypotension: No Has patient had a PCN reaction causing severe rash involving mucus membranes or skin necrosis: No Has patient had a PCN reaction that required hospitalization No Has patient had a PCN reaction occurring within the last 10 years: No If all of the above answers are "NO", then may proceed with Cephalosporin use.  Has tolerated Rocephin    Patient Measurements: Height: 6' (182.9 cm) Weight: 270 lb 11.6 oz (122.8 kg) IBW/kg (Calculated) : 77.6 Heparin Dosing Weight: 104 kg  Vital Signs: Temp: 97.8 F (36.6 C) (08/02 0627) Temp Source: Oral (08/02 0627) BP: 130/62 (08/02 0627) Pulse Rate: 85 (08/02 0627)  Labs: Recent Labs    11/04/17 2028 11/05/17 0425  HGB 14.8  --   HCT 43.3  --   PLT 165  --   LABPROT 27.8* 25.4*  INR 2.62 2.34  CREATININE 0.97  --     Estimated Creatinine Clearance: 80.8 mL/min (by C-G formula based on SCr of 0.97 mg/dL).   Medical History: Past Medical History:  Diagnosis Date  . Atrial fibrillation ([place])   . Chronic lymphocytic leukemia ([place])   . Coronary atherosclerosis of native coronary artery    Nonobstructive  . Essential hypertension, benign   . Hyperlipidemia   . IgA nephropathy    Dr. Lowanda Foster  . Type 2 diabetes mellitus ([place])   . UTI (urinary tract infection) july  10 th   taking  med     Medications:  Medications Prior to Admission  Medication Sig Dispense Refill Last Dose  . Ascorbic Acid (VITAMIN C) 1000 MG tablet Take 1,000 mg by mouth daily with breakfast.    11/04/2017 at 0800  . Calcium Carb-Cholecalciferol (CALCIUM 600+D) 600-800 MG-UNIT TABS Take 2 tablets by  mouth daily with breakfast.   11/04/2017 at 0800  . cholecalciferol (VITAMIN D) 1000 units tablet Take 1,000 Units by mouth every morning.   11/04/2017 at 0800  . Coenzyme Q10 (CO Q-10) 100 MG CAPS Take 100 mg by mouth daily with breakfast.    11/04/2017 at 0800  . digoxin (LANOXIN) 0.125 MG tablet Take 0.125 mg by mouth daily with breakfast.    11/04/2017 at 0800  . finasteride (PROSCAR) 5 MG tablet Take 5 mg by mouth daily.   11/04/2017 at 0800  . furosemide (LASIX) 20 MG tablet Take 2 pills a day (Patient taking differently: Take 20-40 mg by mouth daily as needed. Take 2 pills a day) 20 tablet 0 11/04/2017 at Unknown time  . glimepiride (AMARYL) 2 MG tablet Take 2 mg by mouth daily with breakfast.    11/04/2017 at 0800  . Insulin Detemir (LEVEMIR FLEXPEN) 100 UNIT/ML Pen Inject 20 Units into the skin every morning.   11/04/2017 at 0800  . lisinopril (PRINIVIL,ZESTRIL) 20 MG tablet Take 40 mg by mouth daily with supper.   11/04/2017 at 0800  . Multiple Vitamin (MULTIVITAMIN WITH MINERALS) TABS tablet Take 1 tablet by mouth daily with breakfast.   11/04/2017 at 0800  . oxyCODONE-acetaminophen (PERCOCET/ROXICET) 5-325 MG tablet Take 1 tablet by mouth every 4 (four) hours as needed for severe pain. 20 tablet 0 Past Week at Unknown time  . VICTOZA 18 MG/3ML SOPN Inject 1.8 mg into  the skin daily with breakfast.   11/04/2017 at 0800  . atorvastatin (LIPITOR) 10 MG tablet Take 10 mg by mouth every evening.   11/03/2017  . clindamycin (CLEOCIN) 300 MG capsule Take 1 capsule (300 mg total) by mouth 4 (four) times daily. X 7 days (Patient not taking: Reported on 11/05/2017) 28 capsule 0 Completed Course at Unknown time  . insulin aspart (NOVOLOG FLEXPEN) 100 UNIT/ML FlexPen Inject 5-18 Units into the skin 3 (three) times daily with meals. Pt uses per sliding scale before lunch and dinner.   11/03/2017  . LORazepam (ATIVAN) 1 MG tablet Take 1 tablet by mouth at bedtime.    11/03/2017  . pioglitazone (ACTOS) 15 MG tablet Take 15 mg by  mouth every evening.    11/03/2017  . predniSONE (DELTASONE) 10 MG tablet Take 1 tablet (10 mg total) by mouth daily with breakfast. 2 tablets for 5 days, 1 tablet for 5 days, 1/2 tablet for 5 days. (Patient not taking: Reported on 11/05/2017) 18 tablet 0 Completed Course at Unknown time  . valACYclovir (VALTREX) 1000 MG tablet Take 1,000 mg by mouth 2 (two) times daily. for 7 days  0 Completed Course at Unknown time  . warfarin (COUMADIN) 7.5 MG tablet Take 7.5 mg by mouth daily.   Not Taking at Unknown time    Assessment: Pharmacy consulted to dose transition to heparin IV from warfarin for patient getting surgery next week. Patient's INR is still supratherapeutic today and will start heparin IV once below 2.    Goal of Therapy:   Monitor platelets by anticoagulation protocol: Yes   Plan:  Hold warfarin today Continue to monitor daily INR and initiate heparin IV once INR is below 2  Ramond Craver 11/05/2017,10:57 AM

## 2017-11-05 NOTE — Progress Notes (Signed)
PROGRESS NOTE  [name] [name] NOB:[id number] DOB: 1935/02/17 DOA: 11/04/2017 PCP: [name], MD  Brief History:  83 y.o. male with hx of DM2, UTI, IgA neph, HTN, afib , CLL and CAD direct admit from Dr Deeann Saint ENT office for mastoiditis/ OM w/ facial nerve palsy failing outpt abx.   Per pt's wife the recent history as follows:   09/28/17 - has cyst removal from the R ear  10/04/17 - returned for removal of the packing  10/16/17 - throbbing pain in R ear, went to ED , CT scan done, went home with clindamycin  10/17/17 - returned to ED w/ R facial droop, "Bell's palsy" diagnosed and started on  prednisone taper   11/04/17 - seen in Dr Deeann Saint office today, not getting better on oral Rx , plan from hosp admit for IV abx , let coumadin wear off and do surgery (mastoidectomy) next week (11/10/17)   Assessment/Plan: Acute mastoiditis/right otitis media -Failed outpatient antibiotics--outpatient clindamycin -Continue ceftriaxone and metronidazole -Start IV vancomycin primarily for mastoiditis  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 -finished valtrex and prednisone as outpt  chronic atrial fibrillation -Holding Coumadin -Daily INR -Start IV heparin when INR less than 2 -continue digoxin  Essential hypertension -Continue lisinopril -Holding furosemide for now  Chronic diastolic CHF -holding lasix--plan to restart 8/3 if serum creatinine stable -daily weights  Nonobstructive CAD -no anginal symptoms    Disposition Plan:   Home in 2-3 days  Family Communication:  Wife updated at bedside 8/2--Total time spent 35 minutes.  Greater than 50% spent face to face counseling and coordinating care.   Consultants:  none  Code Status:  FULL   DVT Prophylaxis:  coumadin   Procedures: As Listed in Progress Note Above  Antibiotics: None    Subjective: Overall, patient states that his ear pain and right jaw pain are slowly improving although still significant.  It is sharp in nature, 7/10.  He  denies any nausea, vomiting or diarrhea, dysphagia.  He denies any headache, fevers, chills.,  Chest pain, shortness of breath.  He denies any hematochezia, melena.  Objective: Vitals:   11/04/17 1732 11/04/17 2213 11/05/17 0627 11/05/17 1331  BP: (!) 124/54 (!) 128/55 130/62 132/72  Pulse: 81 86 85 79  Resp: 18 18 18 20   Temp: 98.4 F (36.9 C) 98.4 F (36.9 C) 97.8 F (36.6 C) 98.2 F (36.8 C)  TempSrc: Oral Oral Oral Oral  SpO2: 95% 95% 98% 96%  Weight:   122.8 kg (270 lb 11.6 oz)   Height:        Intake/Output Summary (Last 24 hours) at 11/05/2017 1740 Last data filed at 11/05/2017 1652 Gross per 24 hour  Intake 1370 ml  Output 300 ml  Net 1070 ml   Weight change:  Exam:   General:  Pt is alert, follows commands appropriately, not in acute distress  HEENT: No icterus, No thrush, No neck mass, [place]/AT  Cardiovascular: RRR, S1/S2, no rubs, no gallops  Respiratory: CTA bilaterally, no wheezing, no crackles, no rhonchi  Abdomen: Soft/+BS, non tender, non distended, no guarding  Extremities: No edema, No lymphangitis, No petechiae, No rashes, no synovitis   Data Reviewed: I have personally reviewed following labs and imaging studies Basic Metabolic Panel: Recent Labs  Lab 11/04/17 2028  NA 136  K 4.1  CL 100  CO2 28  GLUCOSE 140*  BUN 28*  CREATININE 0.97  CALCIUM 9.8   Liver Function Tests: Recent Labs  Lab 11/04/17 2028  AST 18  ALT 20  ALKPHOS 86  BILITOT 1.3*  PROT 6.1*  ALBUMIN 3.5   No results for input(s): LIPASE, AMYLASE in the last 168 hours. No results for input(s): AMMONIA in the last 168 hours. Coagulation Profile: Recent Labs  Lab 11/04/17 2028 11/05/17 0425  INR 2.62 2.34   CBC: Recent Labs  Lab 11/04/17 2028  WBC 12.1*  NEUTROABS 3.6  HGB 14.8  HCT 43.3  MCV 86.4  PLT 165   Cardiac Enzymes: No results for input(s): CKTOTAL, CKMB, CKMBINDEX, TROPONINI in the last 168 hours. BNP: Invalid input(s): POCBNP CBG: Recent  Labs  Lab 11/04/17 1716 11/04/17 2212 11/05/17 0736 11/05/17 1111 11/05/17 1656  GLUCAP 170* 134* 248* 307* 167*   HbA1C: No results for input(s): HGBA1C in the last 72 hours. Urine analysis:    Component Value Date/Time   COLORURINE STRAW (A) 09/17/2016 2200   APPEARANCEUR CLEAR 09/17/2016 2200   LABSPEC 1.008 09/17/2016 2200   PHURINE 5.0 09/17/2016 2200   GLUCOSEU NEGATIVE 09/17/2016 2200   HGBUR MODERATE (A) 09/17/2016 2200   BILIRUBINUR NEGATIVE 09/17/2016 2200   KETONESUR NEGATIVE 09/17/2016 2200   PROTEINUR NEGATIVE 09/17/2016 2200   NITRITE NEGATIVE 09/17/2016 2200   LEUKOCYTESUR NEGATIVE 09/17/2016 2200   Sepsis Labs: @LABRCNTIP (procalcitonin:4,lacticidven:4) )No results found for this or any previous visit (from the past 240 hour(s)).   Scheduled Meds: . atorvastatin  10 mg Oral QPM  . digoxin  0.125 mg Oral Q breakfast  . finasteride  5 mg Oral Daily  . glimepiride  2 mg Oral Q breakfast  . insulin aspart  0-20 Units Subcutaneous TID WC  . insulin aspart  0-5 Units Subcutaneous QHS  . insulin detemir  20 Units Subcutaneous q morning - 10a  . lisinopril  40 mg Oral Q supper  . multivitamin with minerals  1 tablet Oral Q breakfast  . pioglitazone  15 mg Oral QPM  . sodium chloride flush  3 mL Intravenous Q12H  . vitamin C  1,000 mg Oral Q breakfast   Continuous Infusions: . sodium chloride 250 mL (11/04/17 2238)  . cefTRIAXone (ROCEPHIN)  IV Stopped (11/05/17 0010)  . metronidazole Stopped (11/05/17 1436)  . [START ON 11/06/2017] vancomycin      Procedures/Studies: Ct Head Wo Contrast  Result Date: 10/16/2017 CLINICAL DATA:  Right-sided ear pain and headache for 2 weeks after surgery. EXAM: CT HEAD WITHOUT CONTRAST CT TEMPORAL BONES WITH CONTRAST TECHNIQUE: Contiguous axial images were obtained from the base of the skull through the vertex without contrast. Multidetector CT imaging of the temporal bones was performed using the standard protocol with  intravenous contrast. CONTRAST: 75 mL Omnipaque 300 COMPARISON:  Head CT 06/02/2007 FINDINGS: CT HEAD FINDINGS Brain: There is no mass, hemorrhage or extra-axial collection. There is generalized atrophy without lobar predilection. There is no acute or chronic infarction. The brain parenchyma is normal. Vascular: No abnormal hyperdensity of the major intracranial arteries or dural venous sinuses. No intracranial atherosclerosis. Skull: The visualized skull base, calvarium and extracranial soft tissues are normal. Sinuses/Orbits: No fluid levels or advanced mucosal thickening of the visualized paranasal sinuses. The orbits are normal. CT TEMPORAL BONES FINDINGS RIGHT: --Pinna and external auditory canal: Normal. --Ossicular chain: Normal. No erosion or dislocation. --Tympanic membrane: Normal. --Middle ear: Small amounts of intermediate density material within the middle ear adjacent to the tympanic membrane. --Epitympanum: Minimal opacification within the epitympanum. The scutum is sharp. Tegmen tympani is intact. --Cochlea, vestibule, vestibular aqueduct and  semicircular canals: Normal. No evidence of canal dehiscence or otospongiosis. --Internal auditory canal: Normal. No widening of the porus acusticus. --Facial nerve: No focal abnormality along the course of the facial nerve. --Cerebellopontine angle: Normal. --Petrous Apex: Normal. --Mastoids: Partial opacification of the mastoid air cells. No coalescence. --Carotid canal: Normal position. LEFT: --Pinna and external auditory canal: Normal. --Ossicular chain: Normal. No erosion or dislocation. --Tympanic membrane: Normal. --Middle ear: Normal. --Epitympanum: The Prussak space is clear. The scutum is sharp. Tegmen tympani is intact. --Cochlea, vestibule, vestibular aqueduct and semicircular canals: Normal. No evidence of canal dehiscence or otospongiosis. --Internal auditory canal: Normal. No widening of the porus acusticus. --Facial nerve: No focal abnormality  along the course of the facial nerve. --Cerebellopontine angle: Normal. --Petrous Apex: Normal. --Mastoids: Normal. --Carotid canal: Normal position. OTHER: --Nasopharynx: Clear. --Temporomandibular joints: Normal. IMPRESSION: 1. Mild brain parenchymal volume loss without acute intracranial abnormality. 2. Small right mastoid effusion without coalescence. Minimal opacification of the middle ear. No acute findings. 3. Normal left temporal bone structures. Electronically Signed   By: Ulyses Jarred M.D.   On: 10/16/2017 03:45   Mr Jeri Cos PY Contrast  Result Date: 10/26/2017 CLINICAL DATA:  RIGHT external auditory canal abscess drainage. RIGHT facial droop, history of Bell's palsy. History of hypertension, hyperlipidemia, diabetes, leukemia. EXAM: MRI HEAD WITHOUT AND WITH CONTRAST TECHNIQUE: Multiplanar, multiecho pulse sequences of the brain and surrounding structures were obtained without and with intravenous contrast. CONTRAST:  24mL MULTIHANCE GADOBENATE DIMEGLUMINE 529 MG/ML IV SOLN COMPARISON:  CT temporal bones October 16, 2017 FINDINGS: INTERNAL AUDITORY CANALS: No cerebellar pontine angle masses. Normal course, caliber and signal of the bilateral seventh and eighth cranial nerves without abnormal enhancement. Symmetric, normal internal auditory canals. Normal appearance of the inner ear structures. INTRACRANIAL CONTENTS: No reduced diffusion to suggest acute ischemia, typical infection or hypercellular tumor. No susceptibility artifact to suggest hemorrhage. The ventricles and sulci are normal for patient's age. A few subcentimeter supratentorial white matter FLAIR T2 hyperintensities seen with chronic small vessel ischemic changes, less than expected for age. No suspicious parenchymal signal, mass lesions, mass effect. No abnormal intraparenchymal or extra-axial enhancement. No abnormal extra-axial fluid collections. No extra-axial masses. VASCULAR: Normal major intracranial vascular flow voids present at  skull base. Patent dural venous sinuses though not tailored for evaluation. SKULL AND UPPER CERVICAL SPINE: No abnormal sellar expansion. No suspicious calvarial bone marrow signal. Craniocervical junction maintained. SINUSES/ ORBITS: RIGHT mastoid effusion. Partially imaged right mastoid tip 11 mm rim enhancing fluid collection with patchy right inferior mastoid enhancement. Paranasal sinuses are well aerated. The included ocular globes and orbital contents are non-suspicious. Status post bilateral ocular lens implants. OTHER: Abnormally thickened right external auditory canal soft tissues with homogeneous enhancement. Soft tissue enhancement about the mastoid tip (series 18, image 7) and may affect the extracranial facial nerve. IMPRESSION: 1. Normal MRI of the head with without contrast. 2. Subcentimeter fluid collections RIGHT mastoid air cells concerning for abscess. RIGHT mastoiditis. Enhancement about the mastoid tip consistent with cellulitis and potential early Bezold abscess, incompletely evaluated. 3. RIGHT otitis externus. 4. These results will be called to the ordering clinician or representative by the Radiologist Assistant, and communication documented in the PACS or zVision Dashboard. Electronically Signed   By: Elon Alas M.D.   On: 10/26/2017 19:23   Ct Temporal Bones W Contrast  Result Date: 10/16/2017 CLINICAL DATA:  Right-sided ear pain and headache for 2 weeks after surgery. EXAM: CT HEAD WITHOUT CONTRAST CT TEMPORAL BONES WITH CONTRAST  TECHNIQUE: Contiguous axial images were obtained from the base of the skull through the vertex without contrast. Multidetector CT imaging of the temporal bones was performed using the standard protocol with intravenous contrast. CONTRAST: 75 mL Omnipaque 300 COMPARISON:  Head CT 06/02/2007 FINDINGS: CT HEAD FINDINGS Brain: There is no mass, hemorrhage or extra-axial collection. There is generalized atrophy without lobar predilection. There is no acute  or chronic infarction. The brain parenchyma is normal. Vascular: No abnormal hyperdensity of the major intracranial arteries or dural venous sinuses. No intracranial atherosclerosis. Skull: The visualized skull base, calvarium and extracranial soft tissues are normal. Sinuses/Orbits: No fluid levels or advanced mucosal thickening of the visualized paranasal sinuses. The orbits are normal. CT TEMPORAL BONES FINDINGS RIGHT: --Pinna and external auditory canal: Normal. --Ossicular chain: Normal. No erosion or dislocation. --Tympanic membrane: Normal. --Middle ear: Small amounts of intermediate density material within the middle ear adjacent to the tympanic membrane. --Epitympanum: Minimal opacification within the epitympanum. The scutum is sharp. Tegmen tympani is intact. --Cochlea, vestibule, vestibular aqueduct and semicircular canals: Normal. No evidence of canal dehiscence or otospongiosis. --Internal auditory canal: Normal. No widening of the porus acusticus. --Facial nerve: No focal abnormality along the course of the facial nerve. --Cerebellopontine angle: Normal. --Petrous Apex: Normal. --Mastoids: Partial opacification of the mastoid air cells. No coalescence. --Carotid canal: Normal position. LEFT: --Pinna and external auditory canal: Normal. --Ossicular chain: Normal. No erosion or dislocation. --Tympanic membrane: Normal. --Middle ear: Normal. --Epitympanum: The Prussak space is clear. The scutum is sharp. Tegmen tympani is intact. --Cochlea, vestibule, vestibular aqueduct and semicircular canals: Normal. No evidence of canal dehiscence or otospongiosis. --Internal auditory canal: Normal. No widening of the porus acusticus. --Facial nerve: No focal abnormality along the course of the facial nerve. --Cerebellopontine angle: Normal. --Petrous Apex: Normal. --Mastoids: Normal. --Carotid canal: Normal position. OTHER: --Nasopharynx: Clear. --Temporomandibular joints: Normal. IMPRESSION: 1. Mild brain  parenchymal volume loss without acute intracranial abnormality. 2. Small right mastoid effusion without coalescence. Minimal opacification of the middle ear. No acute findings. 3. Normal left temporal bone structures. Electronically Signed   By: Ulyses Jarred M.D.   On: 10/16/2017 03:45    Orson Eva, DO  [facility] Pager [phone]  If 7PM-7AM, please contact night-coverage [email or web address] Password [email or web address] 11/05/2017, 5:40 PM   LOS: 1 day

## 2017-11-05 NOTE — Progress Notes (Signed)
Pharmacy Antibiotic Note  [name] [name] is a 83 y.o. male admitted on 11/04/2017 with mastoiditis.  Pharmacy has been consulted for Vancomycin dosing.  Plan: Vancomycin 2000 mg IV x 1 dose Vancomycin 1000 mg IV every 12 hours.  Goal trough 15-20 mcg/mL.  Ceftriaxone 2000 mg IV every 24 hours Metronidazole 500 mg IV every 8 hours Monitor labs, c/s, and vanco trough as indicated  Height: 6' (182.9 cm) Weight: 270 lb 11.6 oz (122.8 kg) IBW/kg (Calculated) : 77.6  Temp (24hrs), Avg:98.2 F (36.8 C), Min:97.8 F (36.6 C), Max:98.4 F (36.9 C)  Recent Labs  Lab 11/04/17 2028  WBC 12.1*  CREATININE 0.97    Estimated Creatinine Clearance: 80.8 mL/min (by C-G formula based on SCr of 0.97 mg/dL).    Allergies  Allergen Reactions  . Erythromycin Other (See Comments)  . Penicillins Hives and Other (See Comments)    Has patient had a PCN reaction causing immediate rash, facial/tongue/throat swelling, SOB or lightheadedness with hypotension: No Has patient had a PCN reaction causing severe rash involving mucus membranes or skin necrosis: No Has patient had a PCN reaction that required hospitalization No Has patient had a PCN reaction occurring within the last 10 years: No If all of the above answers are "NO", then may proceed with Cephalosporin use.  Has tolerated Rocephin    Antimicrobials this admission: Vanco 8/2 >>  Ceftriaxone 8/1 >>  Flagyl 8/1 >>  Dose adjustments this admission: N/A  Microbiology results: None pending  Thank you for allowing pharmacy to be a part of this patient's care.  Ramond Craver 11/05/2017 1:47 PM

## 2017-11-06 DIAGNOSIS — E1165 Type 2 diabetes mellitus with hyperglycemia: Secondary | ICD-10-CM

## 2017-11-06 DIAGNOSIS — Z794 Long term (current) use of insulin: Secondary | ICD-10-CM

## 2017-11-06 LAB — BASIC METABOLIC PANEL
ANION GAP: 7 (ref 5–15)
BUN: 21 mg/dL (ref 8–23)
CALCIUM: 9 mg/dL (ref 8.9–10.3)
CO2: 28 mmol/L (ref 22–32)
CREATININE: 0.9 mg/dL (ref 0.61–1.24)
Chloride: 104 mmol/L (ref 98–111)
Glucose, Bld: 211 mg/dL — ABNORMAL HIGH (ref 70–99)
Potassium: 4.6 mmol/L (ref 3.5–5.1)
SODIUM: 139 mmol/L (ref 135–145)

## 2017-11-06 LAB — CBC
HCT: 44.7 % (ref 39.0–52.0)
HEMOGLOBIN: 14.7 g/dL (ref 13.0–17.0)
MCH: 28.9 pg (ref 26.0–34.0)
MCHC: 32.9 g/dL (ref 30.0–36.0)
MCV: 87.8 fL (ref 78.0–100.0)
PLATELETS: 168 10*3/uL (ref 150–400)
RBC: 5.09 MIL/uL (ref 4.22–5.81)
RDW: 15.2 % (ref 11.5–15.5)
WBC: 10.6 10*3/uL — AB (ref 4.0–10.5)

## 2017-11-06 LAB — GLUCOSE, CAPILLARY
GLUCOSE-CAPILLARY: 313 mg/dL — AB (ref 70–99)
Glucose-Capillary: 206 mg/dL — ABNORMAL HIGH (ref 70–99)
Glucose-Capillary: 208 mg/dL — ABNORMAL HIGH (ref 70–99)
Glucose-Capillary: 102 mg/dL — ABNORMAL HIGH (ref 70–99)

## 2017-11-06 LAB — PROTIME-INR
INR: 1.75
PROTHROMBIN TIME: 20.3 s — AB (ref 11.4–15.2)

## 2017-11-06 LAB — HEPARIN LEVEL (UNFRACTIONATED): HEPARIN UNFRACTIONATED: 0.43 [IU]/mL (ref 0.30–0.70)

## 2017-11-06 MED ORDER — HEPARIN BOLUS VIA INFUSION
5000.0000 [IU] | Freq: Once | INTRAVENOUS | Status: AC
Start: 2017-11-06 — End: 2017-11-06
  Administered 2017-11-06: 5000 [IU] via INTRAVENOUS
  Filled 2017-11-06: qty 5000

## 2017-11-06 MED ORDER — HEPARIN (PORCINE) IN NACL 100-0.45 UNIT/ML-% IJ SOLN
1500.0000 [IU]/h | INTRAMUSCULAR | Status: DC
  Administered 2017-11-06 – 2017-11-08 (×6): 1500 [IU]/h via INTRAVENOUS
  Filled 2017-11-06 (×5): qty 250

## 2017-11-06 MED ORDER — FUROSEMIDE 40 MG PO TABS
40.0000 mg | ORAL_TABLET | Freq: Every day | ORAL | Status: DC
  Administered 2017-11-07 – 2017-11-09 (×3): 40 mg via ORAL
  Filled 2017-11-06 (×3): qty 1

## 2017-11-06 MED ORDER — INSULIN ASPART 100 UNIT/ML ~~LOC~~ SOLN
4.0000 [IU] | Freq: Three times a day (TID) | SUBCUTANEOUS | Status: DC
  Administered 2017-11-06 – 2017-11-08 (×6): 4 [IU] via SUBCUTANEOUS

## 2017-11-06 MED ORDER — INSULIN DETEMIR 100 UNIT/ML ~~LOC~~ SOLN
30.0000 [IU] | Freq: Every morning | SUBCUTANEOUS | Status: DC
  Administered 2017-11-07 – 2017-11-08 (×2): 30 [IU] via SUBCUTANEOUS
  Filled 2017-11-06 (×3): qty 0.3

## 2017-11-06 NOTE — Progress Notes (Signed)
ANTICOAGULATION CONSULT NOTE - Initial Consult  Pharmacy Consult for heparin (warfarin on hold) Indication: atrial fibrillation  Allergies  Allergen Reactions  . Erythromycin Other (See Comments)  . Penicillins Hives and Other (See Comments)    Has patient had a PCN reaction causing immediate rash, facial/tongue/throat swelling, SOB or lightheadedness with hypotension: No Has patient had a PCN reaction causing severe rash involving mucus membranes or skin necrosis: No Has patient had a PCN reaction that required hospitalization No Has patient had a PCN reaction occurring within the last 10 years: No If all of the above answers are "NO", then may proceed with Cephalosporin use.  Has tolerated Rocephin    Patient Measurements: Height: 6' (182.9 cm) Weight: 270 lb 11.6 oz (122.8 kg) IBW/kg (Calculated) : 77.6 Heparin Dosing Weight: 104 kg  Vital Signs: Temp: 98.1 F (36.7 C) (08/03 0650) Temp Source: Oral (08/03 0650) BP: 132/65 (08/03 0650) Pulse Rate: 78 (08/03 0650)  Labs: Recent Labs    11/04/17 2028 11/05/17 0425 11/06/17 0713  HGB 14.8  --  14.7  HCT 43.3  --  44.7  PLT 165  --  168  LABPROT 27.8* 25.4* 20.3*  INR 2.62 2.34 1.75  CREATININE 0.97  --  0.90    Estimated Creatinine Clearance: 87.1 mL/min (by C-G formula based on SCr of 0.9 mg/dL).   Medical History: Past Medical History:  Diagnosis Date  . Atrial fibrillation ([place])   . Chronic lymphocytic leukemia ([place])   . Coronary atherosclerosis of native coronary artery    Nonobstructive  . Essential hypertension, benign   . Hyperlipidemia   . IgA nephropathy    Dr. Lowanda Foster  . Type 2 diabetes mellitus ([place])   . UTI (urinary tract infection) july  10 th   taking  med     Medications:  Medications Prior to Admission  Medication Sig Dispense Refill Last Dose  . Ascorbic Acid (VITAMIN C) 1000 MG tablet Take 1,000 mg by mouth daily with breakfast.    11/04/2017 at 0800  . Calcium Carb-Cholecalciferol  (CALCIUM 600+D) 600-800 MG-UNIT TABS Take 2 tablets by mouth daily with breakfast.   11/04/2017 at 0800  . cholecalciferol (VITAMIN D) 1000 units tablet Take 1,000 Units by mouth every morning.   11/04/2017 at 0800  . Coenzyme Q10 (CO Q-10) 100 MG CAPS Take 100 mg by mouth daily with breakfast.    11/04/2017 at 0800  . digoxin (LANOXIN) 0.125 MG tablet Take 0.125 mg by mouth daily with breakfast.    11/04/2017 at 0800  . finasteride (PROSCAR) 5 MG tablet Take 5 mg by mouth daily.   11/04/2017 at 0800  . furosemide (LASIX) 20 MG tablet Take 2 pills a day (Patient taking differently: Take 20-40 mg by mouth daily as needed. Take 2 pills a day) 20 tablet 0 11/04/2017 at Unknown time  . glimepiride (AMARYL) 2 MG tablet Take 2 mg by mouth daily with breakfast.    11/04/2017 at 0800  . Insulin Detemir (LEVEMIR FLEXPEN) 100 UNIT/ML Pen Inject 20 Units into the skin every morning.   11/04/2017 at 0800  . lisinopril (PRINIVIL,ZESTRIL) 20 MG tablet Take 40 mg by mouth daily with supper.   11/04/2017 at 0800  . Multiple Vitamin (MULTIVITAMIN WITH MINERALS) TABS tablet Take 1 tablet by mouth daily with breakfast.   11/04/2017 at 0800  . oxyCODONE-acetaminophen (PERCOCET/ROXICET) 5-325 MG tablet Take 1 tablet by mouth every 4 (four) hours as needed for severe pain. 20 tablet 0 Past Week at Unknown  time  . VICTOZA 18 MG/3ML SOPN Inject 1.8 mg into the skin daily with breakfast.   11/04/2017 at 0800  . atorvastatin (LIPITOR) 10 MG tablet Take 10 mg by mouth every evening.   11/03/2017  . clindamycin (CLEOCIN) 300 MG capsule Take 1 capsule (300 mg total) by mouth 4 (four) times daily. X 7 days (Patient not taking: Reported on 11/05/2017) 28 capsule 0 Completed Course at Unknown time  . insulin aspart (NOVOLOG FLEXPEN) 100 UNIT/ML FlexPen Inject 5-18 Units into the skin 3 (three) times daily with meals. Pt uses per sliding scale before lunch and dinner.   11/03/2017  . LORazepam (ATIVAN) 1 MG tablet Take 1 tablet by mouth at bedtime.     11/03/2017  . pioglitazone (ACTOS) 15 MG tablet Take 15 mg by mouth every evening.    11/03/2017  . predniSONE (DELTASONE) 10 MG tablet Take 1 tablet (10 mg total) by mouth daily with breakfast. 2 tablets for 5 days, 1 tablet for 5 days, 1/2 tablet for 5 days. (Patient not taking: Reported on 11/05/2017) 18 tablet 0 Completed Course at Unknown time  . valACYclovir (VALTREX) 1000 MG tablet Take 1,000 mg by mouth 2 (two) times daily. for 7 days  0 Completed Course at Unknown time  . warfarin (COUMADIN) 7.5 MG tablet Take 7.5 mg by mouth daily.   Not Taking at Unknown time    Assessment: Pharmacy consulted to dose transition to heparin IV from warfarin for patient getting surgery next week. Patient's INR is under 2 today so will start heparin drip.   Goal of Therapy:  Heparin level 0.3-0.7 units/ml Monitor platelets by anticoagulation protocol: Yes   Plan:  Give 5000 units bolus x 1 Start heparin infusion at 1500 units/hr Check anti-Xa level in 8 hours and daily while on heparin Continue to monitor H&H and platelets  [name] [name] 11/06/2017,9:53 AM

## 2017-11-06 NOTE — Progress Notes (Signed)
ANTICOAGULATION CONSULT NOTE - Initial Consult  Pharmacy Consult for heparin (warfarin on hold) Indication: atrial fibrillation  Allergies  Allergen Reactions  . Erythromycin Other (See Comments)  . Penicillins Hives and Other (See Comments)    Has patient had a PCN reaction causing immediate rash, facial/tongue/throat swelling, SOB or lightheadedness with hypotension: No Has patient had a PCN reaction causing severe rash involving mucus membranes or skin necrosis: No Has patient had a PCN reaction that required hospitalization No Has patient had a PCN reaction occurring within the last 10 years: No If all of the above answers are "NO", then may proceed with Cephalosporin use.  Has tolerated Rocephin    Patient Measurements: Height: 6' (182.9 cm) Weight: 270 lb 11.6 oz (122.8 kg) IBW/kg (Calculated) : 77.6 Heparin Dosing Weight: 104 kg  Vital Signs: Temp: 98.3 F (36.8 C) (08/03 1347) Temp Source: Oral (08/03 1347) BP: 103/40 (08/03 1347) Pulse Rate: 61 (08/03 1347)  Labs: Recent Labs    11/04/17 2028 11/05/17 0425 11/06/17 0713 11/06/17 2030  HGB 14.8  --  14.7  --   HCT 43.3  --  44.7  --   PLT 165  --  168  --   LABPROT 27.8* 25.4* 20.3*  --   INR 2.62 2.34 1.75  --   HEPARINUNFRC  --   --   --  0.43  CREATININE 0.97  --  0.90  --     Estimated Creatinine Clearance: 87.1 mL/min (by C-G formula based on SCr of 0.9 mg/dL).   Medical History: Past Medical History:  Diagnosis Date  . Atrial fibrillation ([place])   . Chronic lymphocytic leukemia ([place])   . Coronary atherosclerosis of native coronary artery    Nonobstructive  . Essential hypertension, benign   . Hyperlipidemia   . IgA nephropathy    Dr. Lowanda Foster  . Type 2 diabetes mellitus ([place])   . UTI (urinary tract infection) july  10 th   taking  med     Medications:  Medications Prior to Admission  Medication Sig Dispense Refill Last Dose  . Ascorbic Acid (VITAMIN C) 1000 MG tablet Take 1,000 mg by  mouth daily with breakfast.    11/04/2017 at 0800  . Calcium Carb-Cholecalciferol (CALCIUM 600+D) 600-800 MG-UNIT TABS Take 2 tablets by mouth daily with breakfast.   11/04/2017 at 0800  . cholecalciferol (VITAMIN D) 1000 units tablet Take 1,000 Units by mouth every morning.   11/04/2017 at 0800  . Coenzyme Q10 (CO Q-10) 100 MG CAPS Take 100 mg by mouth daily with breakfast.    11/04/2017 at 0800  . digoxin (LANOXIN) 0.125 MG tablet Take 0.125 mg by mouth daily with breakfast.    11/04/2017 at 0800  . finasteride (PROSCAR) 5 MG tablet Take 5 mg by mouth daily.   11/04/2017 at 0800  . furosemide (LASIX) 20 MG tablet Take 2 pills a day (Patient taking differently: Take 20-40 mg by mouth daily as needed. Take 2 pills a day) 20 tablet 0 11/04/2017 at Unknown time  . glimepiride (AMARYL) 2 MG tablet Take 2 mg by mouth daily with breakfast.    11/04/2017 at 0800  . Insulin Detemir (LEVEMIR FLEXPEN) 100 UNIT/ML Pen Inject 20 Units into the skin every morning.   11/04/2017 at 0800  . lisinopril (PRINIVIL,ZESTRIL) 20 MG tablet Take 40 mg by mouth daily with supper.   11/04/2017 at 0800  . Multiple Vitamin (MULTIVITAMIN WITH MINERALS) TABS tablet Take 1 tablet by mouth daily with breakfast.  11/04/2017 at 0800  . oxyCODONE-acetaminophen (PERCOCET/ROXICET) 5-325 MG tablet Take 1 tablet by mouth every 4 (four) hours as needed for severe pain. 20 tablet 0 Past Week at Unknown time  . VICTOZA 18 MG/3ML SOPN Inject 1.8 mg into the skin daily with breakfast.   11/04/2017 at 0800  . atorvastatin (LIPITOR) 10 MG tablet Take 10 mg by mouth every evening.   11/03/2017  . clindamycin (CLEOCIN) 300 MG capsule Take 1 capsule (300 mg total) by mouth 4 (four) times daily. X 7 days (Patient not taking: Reported on 11/05/2017) 28 capsule 0 Completed Course at Unknown time  . insulin aspart (NOVOLOG FLEXPEN) 100 UNIT/ML FlexPen Inject 5-18 Units into the skin 3 (three) times daily with meals. Pt uses per sliding scale before lunch and dinner.   11/03/2017   . LORazepam (ATIVAN) 1 MG tablet Take 1 tablet by mouth at bedtime.    11/03/2017  . pioglitazone (ACTOS) 15 MG tablet Take 15 mg by mouth every evening.    11/03/2017  . predniSONE (DELTASONE) 10 MG tablet Take 1 tablet (10 mg total) by mouth daily with breakfast. 2 tablets for 5 days, 1 tablet for 5 days, 1/2 tablet for 5 days. (Patient not taking: Reported on 11/05/2017) 18 tablet 0 Completed Course at Unknown time  . valACYclovir (VALTREX) 1000 MG tablet Take 1,000 mg by mouth 2 (two) times daily. for 7 days  0 Completed Course at Unknown time  . warfarin (COUMADIN) 7.5 MG tablet Take 7.5 mg by mouth daily.   Not Taking at Unknown time    Assessment: Pharmacy consulted to dose transition to heparin IV from warfarin for patient getting surgery next week. Patient's INR is under 2 today so will start heparin drip. Heparin level is therapeutic  Goal of Therapy:  Heparin level 0.3-0.7 units/ml Monitor platelets by anticoagulation protocol: Yes   Plan:  Continue heparin infusion at 1500 units/hr Check anti-Xa level daily while on heparin Continue to monitor H&H and platelets    Ramond Craver 11/06/2017,9:32 PM

## 2017-11-06 NOTE — Progress Notes (Signed)
PROGRESS NOTE  [name] [name] FFM:[id number] DOB: February 09, 1935 DOA: 11/04/2017 PCP: [name], MD  Brief History:  83 y.o.malewith hx of DM2, UTI, IgA neph, HTN, afib , CLL and CAD direct admit from Dr Deeann Saint ENT office for mastoiditis/ OM w/ facial nerve palsy failing outpt abx.  Per pt's wife the recent history as follows:  09/28/17 - has cyst removal from the R ear 10/04/17 - returned for removal of the packing 10/16/17 - throbbing pain in R ear, went to ED , CT scan done, went home with clindamycin 10/17/17 - returned to ED w/ R facial droop, "Bell's palsy" diagnosed and started on  prednisone taper 11/04/17 - seen in Dr Deeann Saint office today, not getting better on oral Rx , plan from hosp admit for IV abx , let coumadin wear off and do surgery (mastoidectomy) next week (11/10/17)   Assessment/Plan: Acute mastoiditis/right otitis media -Failed outpatient antibiotics--outpatient clindamycin -Continue ceftriaxone and metronidazole -Continue IV vancomycin primarily for mastoiditis  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 -finished valtrex and prednisone as outpt  chronic atrial fibrillation -Holding Coumadin -Start IV heparin  -continue digoxin  Diabetes mellitus type 2, uncontrolled with hyperglycemia -increase levemir to 30 units -add novolog 4 units with meals -continue ISS -check A1C  Essential hypertension -Continue lisinopril -Holding furosemide for now  Chronic diastolic CHF -restart lasix -daily weights  Nonobstructive CAD -no anginal symptoms    Disposition Plan:   Home 8/6 if stable  Family Communication:  Wife updated at bedside 8/3   Consultants:  none  Code Status:  FULL   DVT Prophylaxis:  coumadin   Procedures: As Listed in Progress Note Above  Antibiotics: vanco 8/2>>> Ceftriaxone 8/1>>> Flagyl 8/1>>>     Subjective: Overall pain in right ear, posterior auricular area is slowly improving, but still has intermittent  throbbing.  No f/c, HA, dysphagia, cp, sob, n/v/d, abd pain  Objective: Vitals:   11/05/17 1331 11/05/17 2214 11/06/17 0650 11/06/17 1347  BP: 132/72 112/61 132/65 (!) 103/40  Pulse: 79 92 78 61  Resp: 20 20 20 18   Temp: 98.2 F (36.8 C) 97.9 F (36.6 C) 98.1 F (36.7 C) 98.3 F (36.8 C)  TempSrc: Oral Oral Oral Oral  SpO2: 96% 97% 95% 95%  Weight:      Height:        Intake/Output Summary (Last 24 hours) at 11/06/2017 1658 Last data filed at 11/06/2017 1646 Gross per 24 hour  Intake 1840.58 ml  Output 400 ml  Net 1440.58 ml   Weight change:  Exam:   General:  Pt is alert, follows commands appropriately, not in acute distress  HEENT: No icterus, No thrush, No neck mass, [place]/AT  Cardiovascular: RRR, S1/S2, no rubs, no gallops  Respiratory: CTA bilaterally, no wheezing, no crackles, no rhonchi  Abdomen: Soft/+BS, non tender, non distended, no guarding  Extremities: No edema, No lymphangitis, No petechiae, No rashes, no synovitis   Data Reviewed: I have personally reviewed following labs and imaging studies Basic Metabolic Panel: Recent Labs  Lab 11/04/17 2028 11/06/17 0713  NA 136 139  K 4.1 4.6  CL 100 104  CO2 28 28  GLUCOSE 140* 211*  BUN 28* 21  CREATININE 0.97 0.90  CALCIUM 9.8 9.0   Liver Function Tests: Recent Labs  Lab 11/04/17 2028  AST 18  ALT 20  ALKPHOS 86  BILITOT 1.3*  PROT 6.1*  ALBUMIN 3.5   No results for input(s): LIPASE, AMYLASE in  the last 168 hours. No results for input(s): AMMONIA in the last 168 hours. Coagulation Profile: Recent Labs  Lab 11/04/17 2028 11/05/17 0425 11/06/17 0713  INR 2.62 2.34 1.75   CBC: Recent Labs  Lab 11/04/17 2028 11/06/17 0713  WBC 12.1* 10.6*  NEUTROABS 3.6  --   HGB 14.8 14.7  HCT 43.3 44.7  MCV 86.4 87.8  PLT 165 168   Cardiac Enzymes: No results for input(s): CKTOTAL, CKMB, CKMBINDEX, TROPONINI in the last 168 hours. BNP: Invalid input(s): POCBNP CBG: Recent Labs  Lab  11/05/17 1656 11/05/17 2210 11/06/17 0724 11/06/17 1103 11/06/17 1613  GLUCAP 167* 180* 206* 313* 208*   HbA1C: No results for input(s): HGBA1C in the last 72 hours. Urine analysis:    Component Value Date/Time   COLORURINE STRAW (A) 09/17/2016 2200   APPEARANCEUR CLEAR 09/17/2016 2200   LABSPEC 1.008 09/17/2016 2200   PHURINE 5.0 09/17/2016 2200   GLUCOSEU NEGATIVE 09/17/2016 2200   HGBUR MODERATE (A) 09/17/2016 2200   BILIRUBINUR NEGATIVE 09/17/2016 2200   KETONESUR NEGATIVE 09/17/2016 2200   PROTEINUR NEGATIVE 09/17/2016 2200   NITRITE NEGATIVE 09/17/2016 2200   LEUKOCYTESUR NEGATIVE 09/17/2016 2200   Sepsis Labs: @LABRCNTIP (procalcitonin:4,lacticidven:4) )No results found for this or any previous visit (from the past 240 hour(s)).   Scheduled Meds: . atorvastatin  10 mg Oral QPM  . digoxin  0.125 mg Oral Q breakfast  . finasteride  5 mg Oral Daily  . [START ON 11/07/2017] furosemide  40 mg Oral Daily  . insulin aspart  0-20 Units Subcutaneous TID WC  . insulin aspart  0-5 Units Subcutaneous QHS  . insulin aspart  4 Units Subcutaneous TID WC  . [START ON 11/07/2017] insulin detemir  30 Units Subcutaneous q morning - 10a  . lisinopril  40 mg Oral Q supper  . multivitamin with minerals  1 tablet Oral Q breakfast  . sodium chloride flush  3 mL Intravenous Q12H  . vitamin C  1,000 mg Oral Q breakfast   Continuous Infusions: . cefTRIAXone (ROCEPHIN)  IV Stopped (11/05/17 2155)  . heparin 1,500 Units/hr (11/06/17 1024)  . metronidazole Stopped (11/06/17 1533)  . vancomycin Stopped (11/06/17 1646)    Procedures/Studies: Ct Head Wo Contrast  Result Date: 10/16/2017 CLINICAL DATA:  Right-sided ear pain and headache for 2 weeks after surgery. EXAM: CT HEAD WITHOUT CONTRAST CT TEMPORAL BONES WITH CONTRAST TECHNIQUE: Contiguous axial images were obtained from the base of the skull through the vertex without contrast. Multidetector CT imaging of the temporal bones was performed  using the standard protocol with intravenous contrast. CONTRAST: 75 mL Omnipaque 300 COMPARISON:  Head CT 06/02/2007 FINDINGS: CT HEAD FINDINGS Brain: There is no mass, hemorrhage or extra-axial collection. There is generalized atrophy without lobar predilection. There is no acute or chronic infarction. The brain parenchyma is normal. Vascular: No abnormal hyperdensity of the major intracranial arteries or dural venous sinuses. No intracranial atherosclerosis. Skull: The visualized skull base, calvarium and extracranial soft tissues are normal. Sinuses/Orbits: No fluid levels or advanced mucosal thickening of the visualized paranasal sinuses. The orbits are normal. CT TEMPORAL BONES FINDINGS RIGHT: --Pinna and external auditory canal: Normal. --Ossicular chain: Normal. No erosion or dislocation. --Tympanic membrane: Normal. --Middle ear: Small amounts of intermediate density material within the middle ear adjacent to the tympanic membrane. --Epitympanum: Minimal opacification within the epitympanum. The scutum is sharp. Tegmen tympani is intact. --Cochlea, vestibule, vestibular aqueduct and semicircular canals: Normal. No evidence of canal dehiscence or otospongiosis. --Internal auditory canal:  Normal. No widening of the porus acusticus. --Facial nerve: No focal abnormality along the course of the facial nerve. --Cerebellopontine angle: Normal. --Petrous Apex: Normal. --Mastoids: Partial opacification of the mastoid air cells. No coalescence. --Carotid canal: Normal position. LEFT: --Pinna and external auditory canal: Normal. --Ossicular chain: Normal. No erosion or dislocation. --Tympanic membrane: Normal. --Middle ear: Normal. --Epitympanum: The Prussak space is clear. The scutum is sharp. Tegmen tympani is intact. --Cochlea, vestibule, vestibular aqueduct and semicircular canals: Normal. No evidence of canal dehiscence or otospongiosis. --Internal auditory canal: Normal. No widening of the porus acusticus.  --Facial nerve: No focal abnormality along the course of the facial nerve. --Cerebellopontine angle: Normal. --Petrous Apex: Normal. --Mastoids: Normal. --Carotid canal: Normal position. OTHER: --Nasopharynx: Clear. --Temporomandibular joints: Normal. IMPRESSION: 1. Mild brain parenchymal volume loss without acute intracranial abnormality. 2. Small right mastoid effusion without coalescence. Minimal opacification of the middle ear. No acute findings. 3. Normal left temporal bone structures. Electronically Signed   By: Ulyses Jarred M.D.   On: 10/16/2017 03:45   Mr Jeri Cos QQ Contrast  Result Date: 10/26/2017 CLINICAL DATA:  RIGHT external auditory canal abscess drainage. RIGHT facial droop, history of Bell's palsy. History of hypertension, hyperlipidemia, diabetes, leukemia. EXAM: MRI HEAD WITHOUT AND WITH CONTRAST TECHNIQUE: Multiplanar, multiecho pulse sequences of the brain and surrounding structures were obtained without and with intravenous contrast. CONTRAST:  20mL MULTIHANCE GADOBENATE DIMEGLUMINE 529 MG/ML IV SOLN COMPARISON:  CT temporal bones October 16, 2017 FINDINGS: INTERNAL AUDITORY CANALS: No cerebellar pontine angle masses. Normal course, caliber and signal of the bilateral seventh and eighth cranial nerves without abnormal enhancement. Symmetric, normal internal auditory canals. Normal appearance of the inner ear structures. INTRACRANIAL CONTENTS: No reduced diffusion to suggest acute ischemia, typical infection or hypercellular tumor. No susceptibility artifact to suggest hemorrhage. The ventricles and sulci are normal for patient's age. A few subcentimeter supratentorial white matter FLAIR T2 hyperintensities seen with chronic small vessel ischemic changes, less than expected for age. No suspicious parenchymal signal, mass lesions, mass effect. No abnormal intraparenchymal or extra-axial enhancement. No abnormal extra-axial fluid collections. No extra-axial masses. VASCULAR: Normal major  intracranial vascular flow voids present at skull base. Patent dural venous sinuses though not tailored for evaluation. SKULL AND UPPER CERVICAL SPINE: No abnormal sellar expansion. No suspicious calvarial bone marrow signal. Craniocervical junction maintained. SINUSES/ ORBITS: RIGHT mastoid effusion. Partially imaged right mastoid tip 11 mm rim enhancing fluid collection with patchy right inferior mastoid enhancement. Paranasal sinuses are well aerated. The included ocular globes and orbital contents are non-suspicious. Status post bilateral ocular lens implants. OTHER: Abnormally thickened right external auditory canal soft tissues with homogeneous enhancement. Soft tissue enhancement about the mastoid tip (series 18, image 7) and may affect the extracranial facial nerve. IMPRESSION: 1. Normal MRI of the head with without contrast. 2. Subcentimeter fluid collections RIGHT mastoid air cells concerning for abscess. RIGHT mastoiditis. Enhancement about the mastoid tip consistent with cellulitis and potential early Bezold abscess, incompletely evaluated. 3. RIGHT otitis externus. 4. These results will be called to the ordering clinician or representative by the Radiologist Assistant, and communication documented in the PACS or zVision Dashboard. Electronically Signed   By: Elon Alas M.D.   On: 10/26/2017 19:23   Ct Temporal Bones W Contrast  Result Date: 10/16/2017 CLINICAL DATA:  Right-sided ear pain and headache for 2 weeks after surgery. EXAM: CT HEAD WITHOUT CONTRAST CT TEMPORAL BONES WITH CONTRAST TECHNIQUE: Contiguous axial images were obtained from the base of the skull through  the vertex without contrast. Multidetector CT imaging of the temporal bones was performed using the standard protocol with intravenous contrast. CONTRAST: 75 mL Omnipaque 300 COMPARISON:  Head CT 06/02/2007 FINDINGS: CT HEAD FINDINGS Brain: There is no mass, hemorrhage or extra-axial collection. There is generalized atrophy  without lobar predilection. There is no acute or chronic infarction. The brain parenchyma is normal. Vascular: No abnormal hyperdensity of the major intracranial arteries or dural venous sinuses. No intracranial atherosclerosis. Skull: The visualized skull base, calvarium and extracranial soft tissues are normal. Sinuses/Orbits: No fluid levels or advanced mucosal thickening of the visualized paranasal sinuses. The orbits are normal. CT TEMPORAL BONES FINDINGS RIGHT: --Pinna and external auditory canal: Normal. --Ossicular chain: Normal. No erosion or dislocation. --Tympanic membrane: Normal. --Middle ear: Small amounts of intermediate density material within the middle ear adjacent to the tympanic membrane. --Epitympanum: Minimal opacification within the epitympanum. The scutum is sharp. Tegmen tympani is intact. --Cochlea, vestibule, vestibular aqueduct and semicircular canals: Normal. No evidence of canal dehiscence or otospongiosis. --Internal auditory canal: Normal. No widening of the porus acusticus. --Facial nerve: No focal abnormality along the course of the facial nerve. --Cerebellopontine angle: Normal. --Petrous Apex: Normal. --Mastoids: Partial opacification of the mastoid air cells. No coalescence. --Carotid canal: Normal position. LEFT: --Pinna and external auditory canal: Normal. --Ossicular chain: Normal. No erosion or dislocation. --Tympanic membrane: Normal. --Middle ear: Normal. --Epitympanum: The Prussak space is clear. The scutum is sharp. Tegmen tympani is intact. --Cochlea, vestibule, vestibular aqueduct and semicircular canals: Normal. No evidence of canal dehiscence or otospongiosis. --Internal auditory canal: Normal. No widening of the porus acusticus. --Facial nerve: No focal abnormality along the course of the facial nerve. --Cerebellopontine angle: Normal. --Petrous Apex: Normal. --Mastoids: Normal. --Carotid canal: Normal position. OTHER: --Nasopharynx: Clear. --Temporomandibular  joints: Normal. IMPRESSION: 1. Mild brain parenchymal volume loss without acute intracranial abnormality. 2. Small right mastoid effusion without coalescence. Minimal opacification of the middle ear. No acute findings. 3. Normal left temporal bone structures. Electronically Signed   By: Ulyses Jarred M.D.   On: 10/16/2017 03:45    Orson Eva, DO  [facility] Pager [phone]  If 7PM-7AM, please contact night-coverage [email or web address] Password [email or web address] 11/06/2017, 4:58 PM   LOS: 2 days

## 2017-11-07 LAB — BASIC METABOLIC PANEL
Anion gap: 6 (ref 5–15)
BUN: 19 mg/dL (ref 8–23)
CALCIUM: 8.5 mg/dL — AB (ref 8.9–10.3)
CHLORIDE: 104 mmol/L (ref 98–111)
CO2: 27 mmol/L (ref 22–32)
CREATININE: 0.84 mg/dL (ref 0.61–1.24)
GFR calc non Af Amer: >60 mL/min (ref >60)
Glucose, Bld: 154 mg/dL — ABNORMAL HIGH (ref 70–99)
Potassium: 4.3 mmol/L (ref 3.5–5.1)
SODIUM: 137 mmol/L (ref 135–145)

## 2017-11-07 LAB — CBC
HCT: 41.3 % (ref 39.0–52.0)
Hemoglobin: 13.5 g/dL (ref 13.0–17.0)
MCH: 28.6 pg (ref 26.0–34.0)
MCHC: 32.7 g/dL (ref 30.0–36.0)
MCV: 87.5 fL (ref 78.0–100.0)
PLATELETS: 148 10*3/uL — AB (ref 150–400)
RBC: 4.72 MIL/uL (ref 4.22–5.81)
RDW: 15.3 % (ref 11.5–15.5)
WBC: 9.8 10*3/uL (ref 4.0–10.5)

## 2017-11-07 LAB — GLUCOSE, CAPILLARY
GLUCOSE-CAPILLARY: 138 mg/dL — AB (ref 70–99)
Glucose-Capillary: 260 mg/dL — ABNORMAL HIGH (ref 70–99)
Glucose-Capillary: 212 mg/dL — ABNORMAL HIGH (ref 70–99)
Glucose-Capillary: 152 mg/dL — ABNORMAL HIGH (ref 70–99)

## 2017-11-07 LAB — HEMOGLOBIN A1C
HEMOGLOBIN A1C: 10.2 % — AB (ref 4.8–5.6)
MEAN PLASMA GLUCOSE: 246.04 mg/dL

## 2017-11-07 LAB — VANCOMYCIN, TROUGH: Vancomycin Tr: 12 ug/mL — ABNORMAL LOW (ref 15–20)

## 2017-11-07 LAB — HEPARIN LEVEL (UNFRACTIONATED): HEPARIN UNFRACTIONATED: 0.42 [IU]/mL (ref 0.30–0.70)

## 2017-11-07 MED ORDER — VANCOMYCIN HCL 10 G IV SOLR
1250.0000 mg | Freq: Two times a day (BID) | INTRAVENOUS | Status: DC
  Administered 2017-11-08 – 2017-11-09 (×3): 1250 mg via INTRAVENOUS
  Filled 2017-11-07 (×2): qty 1250
  Filled 2017-11-07: qty 1000
  Filled 2017-11-07 (×3): qty 1250

## 2017-11-07 NOTE — Progress Notes (Signed)
PROGRESS NOTE  [name] [name] XLK:[id number] DOB: October 19, 1934 DOA: 11/04/2017 PCP: [name], MD  Brief History: 83 y.o.malewith hx of DM2, UTI, IgA neph, HTN, afib , CLL and CAD direct admit from Dr Deeann Saint ENT office for mastoiditis/ OM w/ facial nerve palsy failing outpt abx.  Per pt's wife the recent history as follows:  09/28/17- has cyst removal from the R ear 10/04/17 - returned for removal of the packing 10/16/17 - throbbing pain in R ear, went to ED , CT scan done, went homewith clindamycin 10/17/17 - returned to ED w/ R facial droop, "Bell's palsy" diagnosed and started on prednisone taper 11/04/17 - seen in Dr Deeann Saint office today, not getting better on oral Rx , plan from hosp admit for IV abx , let coumadin wear off and do surgery (mastoidectomy) next week(11/10/17)   Assessment/Plan: Acute mastoiditis/right otitis media -Failed outpatient antibiotics--outpatient clindamycin -Continue ceftriaxone and metronidazole -Continue IV vancomycin primarily for mastoiditis  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 -finished valtrex and prednisone as outpt  chronicatrial fibrillation -Holding Coumadin -Start IV heparin  -continue digoxin  Diabetes mellitus type 2, uncontrolled with hyperglycemia -increase levemir to 30 units -add novolog 4 units with meals -continue ISS -check A1C--pending  Essential hypertension -Continue lisinopril -restart furosemide  Chronic diastolic CHF -restart lasix -daily weights  Nonobstructive CAD -no anginal symptoms    Disposition Plan: Home 8/6 if stable  Family Communication:Wife updatedat bedside 8/3   Consultants:none  Code Status: FULL   DVT Prophylaxis:coumadin   Procedures: As Listed in Progress Note Above  Antibiotics: vanco 8/2>>> Ceftriaxone 8/1>>> Flagyl 8/1>>>      Subjective: overall pain in right ear and posterior auricular area are slowly improving.  Denies cp, sob, n/v/d, abd  pain  Objective: Vitals:   11/06/17 1347 11/06/17 2200 11/07/17 0431 11/07/17 1637  BP: (!) 103/40 126/68 (!) 130/55 125/62  Pulse: 61 65 73 68  Resp: 18 20 20    Temp: 98.3 F (36.8 C) 98.8 F (37.1 C) 98.4 F (36.9 C) 97.9 F (36.6 C)  TempSrc: Oral Oral Oral Oral  SpO2: 95% 97% 96% 97%  Weight:   122.2 kg (269 lb 8 oz)   Height:        Intake/Output Summary (Last 24 hours) at 11/07/2017 1713 Last data filed at 11/07/2017 0746 Gross per 24 hour  Intake 518.42 ml  Output 400 ml  Net 118.42 ml   Weight change: 0.011 kg (0.4 oz) Exam:   General:  Pt is alert, follows commands appropriately, not in acute distress  HEENT: No icterus, No thrush, No neck mass, [place]/AT  Cardiovascular: RRR, S1/S2, no rubs, no gallops  Respiratory: CTA bilaterally, no wheezing, no crackles, no rhonchi  Abdomen: Soft/+BS, non tender, non distended, no guarding  Extremities: No edema, No lymphangitis, No petechiae, No rashes, no synovitis   Data Reviewed: I have personally reviewed following labs and imaging studies Basic Metabolic Panel: Recent Labs  Lab 11/04/17 2028 11/06/17 0713 11/07/17 0735  NA 136 139 137  K 4.1 4.6 4.3  CL 100 104 104  CO2 28 28 27   GLUCOSE 140* 211* 154*  BUN 28* 21 19  CREATININE 0.97 0.90 0.84  CALCIUM 9.8 9.0 8.5*   Liver Function Tests: Recent Labs  Lab 11/04/17 2028  AST 18  ALT 20  ALKPHOS 86  BILITOT 1.3*  PROT 6.1*  ALBUMIN 3.5   No results for input(s): LIPASE, AMYLASE in the last 168 hours. No  results for input(s): AMMONIA in the last 168 hours. Coagulation Profile: Recent Labs  Lab 11/04/17 2028 11/05/17 0425 11/06/17 0713  INR 2.62 2.34 1.75   CBC: Recent Labs  Lab 11/04/17 2028 11/06/17 0713 11/07/17 0735  WBC 12.1* 10.6* 9.8  NEUTROABS 3.6  --   --   HGB 14.8 14.7 13.5  HCT 43.3 44.7 41.3  MCV 86.4 87.8 87.5  PLT 165 168 148*   Cardiac Enzymes: No results for input(s): CKTOTAL, CKMB, CKMBINDEX, TROPONINI in the last  168 hours. BNP: Invalid input(s): POCBNP CBG: Recent Labs  Lab 11/06/17 1613 11/06/17 2157 11/07/17 0724 11/07/17 1113 11/07/17 1607  GLUCAP 208* 102* 138* 260* 212*   HbA1C: No results for input(s): HGBA1C in the last 72 hours. Urine analysis:    Component Value Date/Time   COLORURINE STRAW (A) 09/17/2016 2200   APPEARANCEUR CLEAR 09/17/2016 2200   LABSPEC 1.008 09/17/2016 2200   PHURINE 5.0 09/17/2016 2200   GLUCOSEU NEGATIVE 09/17/2016 2200   HGBUR MODERATE (A) 09/17/2016 2200   BILIRUBINUR NEGATIVE 09/17/2016 2200   KETONESUR NEGATIVE 09/17/2016 2200   PROTEINUR NEGATIVE 09/17/2016 2200   NITRITE NEGATIVE 09/17/2016 2200   LEUKOCYTESUR NEGATIVE 09/17/2016 2200   Sepsis Labs: @LABRCNTIP (procalcitonin:4,lacticidven:4) )No results found for this or any previous visit (from the past 240 hour(s)).   Scheduled Meds: . atorvastatin  10 mg Oral QPM  . digoxin  0.125 mg Oral Q breakfast  . finasteride  5 mg Oral Daily  . furosemide  40 mg Oral Daily  . insulin aspart  0-20 Units Subcutaneous TID WC  . insulin aspart  0-5 Units Subcutaneous QHS  . insulin aspart  4 Units Subcutaneous TID WC  . insulin detemir  30 Units Subcutaneous q morning - 10a  . lisinopril  40 mg Oral Q supper  . multivitamin with minerals  1 tablet Oral Q breakfast  . sodium chloride flush  3 mL Intravenous Q12H  . vitamin C  1,000 mg Oral Q breakfast   Continuous Infusions: . cefTRIAXone (ROCEPHIN)  IV Stopped (11/06/17 2307)  . heparin 1,500 Units/hr (11/07/17 1414)  . metronidazole Stopped (11/07/17 1513)  . [START ON 11/08/2017] vancomycin      Procedures/Studies: Ct Head Wo Contrast  Result Date: 10/16/2017 CLINICAL DATA:  Right-sided ear pain and headache for 2 weeks after surgery. EXAM: CT HEAD WITHOUT CONTRAST CT TEMPORAL BONES WITH CONTRAST TECHNIQUE: Contiguous axial images were obtained from the base of the skull through the vertex without contrast. Multidetector CT imaging of the  temporal bones was performed using the standard protocol with intravenous contrast. CONTRAST: 75 mL Omnipaque 300 COMPARISON:  Head CT 06/02/2007 FINDINGS: CT HEAD FINDINGS Brain: There is no mass, hemorrhage or extra-axial collection. There is generalized atrophy without lobar predilection. There is no acute or chronic infarction. The brain parenchyma is normal. Vascular: No abnormal hyperdensity of the major intracranial arteries or dural venous sinuses. No intracranial atherosclerosis. Skull: The visualized skull base, calvarium and extracranial soft tissues are normal. Sinuses/Orbits: No fluid levels or advanced mucosal thickening of the visualized paranasal sinuses. The orbits are normal. CT TEMPORAL BONES FINDINGS RIGHT: --Pinna and external auditory canal: Normal. --Ossicular chain: Normal. No erosion or dislocation. --Tympanic membrane: Normal. --Middle ear: Small amounts of intermediate density material within the middle ear adjacent to the tympanic membrane. --Epitympanum: Minimal opacification within the epitympanum. The scutum is sharp. Tegmen tympani is intact. --Cochlea, vestibule, vestibular aqueduct and semicircular canals: Normal. No evidence of canal dehiscence or otospongiosis. --Internal auditory  canal: Normal. No widening of the porus acusticus. --Facial nerve: No focal abnormality along the course of the facial nerve. --Cerebellopontine angle: Normal. --Petrous Apex: Normal. --Mastoids: Partial opacification of the mastoid air cells. No coalescence. --Carotid canal: Normal position. LEFT: --Pinna and external auditory canal: Normal. --Ossicular chain: Normal. No erosion or dislocation. --Tympanic membrane: Normal. --Middle ear: Normal. --Epitympanum: The Prussak space is clear. The scutum is sharp. Tegmen tympani is intact. --Cochlea, vestibule, vestibular aqueduct and semicircular canals: Normal. No evidence of canal dehiscence or otospongiosis. --Internal auditory canal: Normal. No widening  of the porus acusticus. --Facial nerve: No focal abnormality along the course of the facial nerve. --Cerebellopontine angle: Normal. --Petrous Apex: Normal. --Mastoids: Normal. --Carotid canal: Normal position. OTHER: --Nasopharynx: Clear. --Temporomandibular joints: Normal. IMPRESSION: 1. Mild brain parenchymal volume loss without acute intracranial abnormality. 2. Small right mastoid effusion without coalescence. Minimal opacification of the middle ear. No acute findings. 3. Normal left temporal bone structures. Electronically Signed   By: Ulyses Jarred M.D.   On: 10/16/2017 03:45   Mr Jeri Cos IH Contrast  Result Date: 10/26/2017 CLINICAL DATA:  RIGHT external auditory canal abscess drainage. RIGHT facial droop, history of Bell's palsy. History of hypertension, hyperlipidemia, diabetes, leukemia. EXAM: MRI HEAD WITHOUT AND WITH CONTRAST TECHNIQUE: Multiplanar, multiecho pulse sequences of the brain and surrounding structures were obtained without and with intravenous contrast. CONTRAST:  77mL MULTIHANCE GADOBENATE DIMEGLUMINE 529 MG/ML IV SOLN COMPARISON:  CT temporal bones October 16, 2017 FINDINGS: INTERNAL AUDITORY CANALS: No cerebellar pontine angle masses. Normal course, caliber and signal of the bilateral seventh and eighth cranial nerves without abnormal enhancement. Symmetric, normal internal auditory canals. Normal appearance of the inner ear structures. INTRACRANIAL CONTENTS: No reduced diffusion to suggest acute ischemia, typical infection or hypercellular tumor. No susceptibility artifact to suggest hemorrhage. The ventricles and sulci are normal for patient's age. A few subcentimeter supratentorial white matter FLAIR T2 hyperintensities seen with chronic small vessel ischemic changes, less than expected for age. No suspicious parenchymal signal, mass lesions, mass effect. No abnormal intraparenchymal or extra-axial enhancement. No abnormal extra-axial fluid collections. No extra-axial masses.  VASCULAR: Normal major intracranial vascular flow voids present at skull base. Patent dural venous sinuses though not tailored for evaluation. SKULL AND UPPER CERVICAL SPINE: No abnormal sellar expansion. No suspicious calvarial bone marrow signal. Craniocervical junction maintained. SINUSES/ ORBITS: RIGHT mastoid effusion. Partially imaged right mastoid tip 11 mm rim enhancing fluid collection with patchy right inferior mastoid enhancement. Paranasal sinuses are well aerated. The included ocular globes and orbital contents are non-suspicious. Status post bilateral ocular lens implants. OTHER: Abnormally thickened right external auditory canal soft tissues with homogeneous enhancement. Soft tissue enhancement about the mastoid tip (series 18, image 7) and may affect the extracranial facial nerve. IMPRESSION: 1. Normal MRI of the head with without contrast. 2. Subcentimeter fluid collections RIGHT mastoid air cells concerning for abscess. RIGHT mastoiditis. Enhancement about the mastoid tip consistent with cellulitis and potential early Bezold abscess, incompletely evaluated. 3. RIGHT otitis externus. 4. These results will be called to the ordering clinician or representative by the Radiologist Assistant, and communication documented in the PACS or zVision Dashboard. Electronically Signed   By: Elon Alas M.D.   On: 10/26/2017 19:23   Ct Temporal Bones W Contrast  Result Date: 10/16/2017 CLINICAL DATA:  Right-sided ear pain and headache for 2 weeks after surgery. EXAM: CT HEAD WITHOUT CONTRAST CT TEMPORAL BONES WITH CONTRAST TECHNIQUE: Contiguous axial images were obtained from the base of the skull  through the vertex without contrast. Multidetector CT imaging of the temporal bones was performed using the standard protocol with intravenous contrast. CONTRAST: 75 mL Omnipaque 300 COMPARISON:  Head CT 06/02/2007 FINDINGS: CT HEAD FINDINGS Brain: There is no mass, hemorrhage or extra-axial collection. There  is generalized atrophy without lobar predilection. There is no acute or chronic infarction. The brain parenchyma is normal. Vascular: No abnormal hyperdensity of the major intracranial arteries or dural venous sinuses. No intracranial atherosclerosis. Skull: The visualized skull base, calvarium and extracranial soft tissues are normal. Sinuses/Orbits: No fluid levels or advanced mucosal thickening of the visualized paranasal sinuses. The orbits are normal. CT TEMPORAL BONES FINDINGS RIGHT: --Pinna and external auditory canal: Normal. --Ossicular chain: Normal. No erosion or dislocation. --Tympanic membrane: Normal. --Middle ear: Small amounts of intermediate density material within the middle ear adjacent to the tympanic membrane. --Epitympanum: Minimal opacification within the epitympanum. The scutum is sharp. Tegmen tympani is intact. --Cochlea, vestibule, vestibular aqueduct and semicircular canals: Normal. No evidence of canal dehiscence or otospongiosis. --Internal auditory canal: Normal. No widening of the porus acusticus. --Facial nerve: No focal abnormality along the course of the facial nerve. --Cerebellopontine angle: Normal. --Petrous Apex: Normal. --Mastoids: Partial opacification of the mastoid air cells. No coalescence. --Carotid canal: Normal position. LEFT: --Pinna and external auditory canal: Normal. --Ossicular chain: Normal. No erosion or dislocation. --Tympanic membrane: Normal. --Middle ear: Normal. --Epitympanum: The Prussak space is clear. The scutum is sharp. Tegmen tympani is intact. --Cochlea, vestibule, vestibular aqueduct and semicircular canals: Normal. No evidence of canal dehiscence or otospongiosis. --Internal auditory canal: Normal. No widening of the porus acusticus. --Facial nerve: No focal abnormality along the course of the facial nerve. --Cerebellopontine angle: Normal. --Petrous Apex: Normal. --Mastoids: Normal. --Carotid canal: Normal position. OTHER: --Nasopharynx: Clear.  --Temporomandibular joints: Normal. IMPRESSION: 1. Mild brain parenchymal volume loss without acute intracranial abnormality. 2. Small right mastoid effusion without coalescence. Minimal opacification of the middle ear. No acute findings. 3. Normal left temporal bone structures. Electronically Signed   By: Ulyses Jarred M.D.   On: 10/16/2017 03:45    Orson Eva, DO  [facility] Pager [phone]  If 7PM-7AM, please contact night-coverage [email or web address] Password [email or web address] 11/07/2017, 5:13 PM   LOS: 3 days

## 2017-11-07 NOTE — Progress Notes (Signed)
Pharmacy Antibiotic Note  [name] [name] is a 83 y.o. male admitted on 11/04/2017 with mastoiditis.  Pharmacy has been consulted for Vancomycin dosing.  Plan: Vancomycin trough of 12 mcg/mL Increase Vancomycin to 1250 mg IV every 12 hours. Goal trough 15-20 mcg/mL Ceftriaxone 2000 mg IV every 24 hours Metronidazole 500 mg IV every 8 hours Monitor labs, c/s, and vanco trough as indicated  Height: 6' (182.9 cm) Weight: 269 lb 8 oz (122.2 kg) IBW/kg (Calculated) : 77.6  Temp (24hrs), Avg:98.4 F (36.9 C), Min:97.9 F (36.6 C), Max:98.8 F (37.1 C)  Recent Labs  Lab 11/04/17 2028 11/06/17 0713 11/07/17 0735 11/07/17 1424  WBC 12.1* 10.6* 9.8  --   CREATININE 0.97 0.90 0.84  --   VANCOTROUGH  --   --   --  12*    Estimated Creatinine Clearance: 93.1 mL/min (by C-G formula based on SCr of 0.84 mg/dL).    Allergies  Allergen Reactions  . Erythromycin Other (See Comments)  . Penicillins Hives and Other (See Comments)    Has patient had a PCN reaction causing immediate rash, facial/tongue/throat swelling, SOB or lightheadedness with hypotension: No Has patient had a PCN reaction causing severe rash involving mucus membranes or skin necrosis: No Has patient had a PCN reaction that required hospitalization No Has patient had a PCN reaction occurring within the last 10 years: No If all of the above answers are "NO", then may proceed with Cephalosporin use.  Has tolerated Rocephin    Antimicrobials this admission: Vanco 8/2 >>  Ceftriaxone 8/1 >>  Flagyl 8/1 >>  Dose adjustments this admission: N/A  Microbiology results: None pending  Thank you for allowing pharmacy to be a part of this patient's care.  Ramond Craver 11/07/2017 4:50 PM

## 2017-11-07 NOTE — Progress Notes (Signed)
ANTICOAGULATION CONSULT NOTE - Initial Consult  Pharmacy Consult for heparin (warfarin on hold) Indication: atrial fibrillation  Allergies  Allergen Reactions  . Erythromycin Other (See Comments)  . Penicillins Hives and Other (See Comments)    Has patient had a PCN reaction causing immediate rash, facial/tongue/throat swelling, SOB or lightheadedness with hypotension: No Has patient had a PCN reaction causing severe rash involving mucus membranes or skin necrosis: No Has patient had a PCN reaction that required hospitalization No Has patient had a PCN reaction occurring within the last 10 years: No If all of the above answers are "NO", then may proceed with Cephalosporin use.  Has tolerated Rocephin    Patient Measurements: Height: 6' (182.9 cm) Weight: 269 lb 8 oz (122.2 kg) IBW/kg (Calculated) : 77.6 Heparin Dosing Weight: 104 kg  Vital Signs: Temp: 98.4 F (36.9 C) (08/04 0431) Temp Source: Oral (08/04 0431) BP: 130/55 (08/04 0431) Pulse Rate: 73 (08/04 0431)  Labs: Recent Labs    11/04/17 2028 11/05/17 0425 11/06/17 0713 11/06/17 2030 11/07/17 0735  HGB 14.8  --  14.7  --  13.5  HCT 43.3  --  44.7  --  41.3  PLT 165  --  168  --  148*  LABPROT 27.8* 25.4* 20.3*  --   --   INR 2.62 2.34 1.75  --   --   HEPARINUNFRC  --   --   --  0.43 0.42  CREATININE 0.97  --  0.90  --   --     Estimated Creatinine Clearance: 86.9 mL/min (by C-G formula based on SCr of 0.9 mg/dL).   Medical History: Past Medical History:  Diagnosis Date  . Atrial fibrillation ([place])   . Chronic lymphocytic leukemia ([place])   . Coronary atherosclerosis of native coronary artery    Nonobstructive  . Essential hypertension, benign   . Hyperlipidemia   . IgA nephropathy    Dr. Lowanda Foster  . Type 2 diabetes mellitus ([place])   . UTI (urinary tract infection) july  10 th   taking  med     Medications:  Medications Prior to Admission  Medication Sig Dispense Refill Last Dose  . Ascorbic  Acid (VITAMIN C) 1000 MG tablet Take 1,000 mg by mouth daily with breakfast.    11/04/2017 at 0800  . Calcium Carb-Cholecalciferol (CALCIUM 600+D) 600-800 MG-UNIT TABS Take 2 tablets by mouth daily with breakfast.   11/04/2017 at 0800  . cholecalciferol (VITAMIN D) 1000 units tablet Take 1,000 Units by mouth every morning.   11/04/2017 at 0800  . Coenzyme Q10 (CO Q-10) 100 MG CAPS Take 100 mg by mouth daily with breakfast.    11/04/2017 at 0800  . digoxin (LANOXIN) 0.125 MG tablet Take 0.125 mg by mouth daily with breakfast.    11/04/2017 at 0800  . finasteride (PROSCAR) 5 MG tablet Take 5 mg by mouth daily.   11/04/2017 at 0800  . furosemide (LASIX) 20 MG tablet Take 2 pills a day (Patient taking differently: Take 20-40 mg by mouth daily as needed. Take 2 pills a day) 20 tablet 0 11/04/2017 at Unknown time  . glimepiride (AMARYL) 2 MG tablet Take 2 mg by mouth daily with breakfast.    11/04/2017 at 0800  . Insulin Detemir (LEVEMIR FLEXPEN) 100 UNIT/ML Pen Inject 20 Units into the skin every morning.   11/04/2017 at 0800  . lisinopril (PRINIVIL,ZESTRIL) 20 MG tablet Take 40 mg by mouth daily with supper.   11/04/2017 at 0800  . Multiple  Vitamin (MULTIVITAMIN WITH MINERALS) TABS tablet Take 1 tablet by mouth daily with breakfast.   11/04/2017 at 0800  . oxyCODONE-acetaminophen (PERCOCET/ROXICET) 5-325 MG tablet Take 1 tablet by mouth every 4 (four) hours as needed for severe pain. 20 tablet 0 Past Week at Unknown time  . VICTOZA 18 MG/3ML SOPN Inject 1.8 mg into the skin daily with breakfast.   11/04/2017 at 0800  . atorvastatin (LIPITOR) 10 MG tablet Take 10 mg by mouth every evening.   11/03/2017  . clindamycin (CLEOCIN) 300 MG capsule Take 1 capsule (300 mg total) by mouth 4 (four) times daily. X 7 days (Patient not taking: Reported on 11/05/2017) 28 capsule 0 Completed Course at Unknown time  . insulin aspart (NOVOLOG FLEXPEN) 100 UNIT/ML FlexPen Inject 5-18 Units into the skin 3 (three) times daily with meals. Pt uses per  sliding scale before lunch and dinner.   11/03/2017  . LORazepam (ATIVAN) 1 MG tablet Take 1 tablet by mouth at bedtime.    11/03/2017  . pioglitazone (ACTOS) 15 MG tablet Take 15 mg by mouth every evening.    11/03/2017  . predniSONE (DELTASONE) 10 MG tablet Take 1 tablet (10 mg total) by mouth daily with breakfast. 2 tablets for 5 days, 1 tablet for 5 days, 1/2 tablet for 5 days. (Patient not taking: Reported on 11/05/2017) 18 tablet 0 Completed Course at Unknown time  . valACYclovir (VALTREX) 1000 MG tablet Take 1,000 mg by mouth 2 (two) times daily. for 7 days  0 Completed Course at Unknown time  . warfarin (COUMADIN) 7.5 MG tablet Take 7.5 mg by mouth daily.   Not Taking at Unknown time    Assessment: Pharmacy consulted to dose transition to heparin IV from warfarin for patient getting surgery next week. Patient's INR is under 2 today so will start heparin drip. Heparin level is therapeutic at 0.42.  Goal of Therapy:  Heparin level 0.3-0.7 units/ml Monitor platelets by anticoagulation protocol: Yes   Plan:  Continue heparin infusion at 1500 units/hr Check anti-Xa level daily while on heparin Continue to monitor H&H and platelets    Revonda Standard Lynnda Wiersma 11/07/2017,8:18 AM

## 2017-11-08 ENCOUNTER — Encounter (HOSPITAL_BASED_OUTPATIENT_CLINIC_OR_DEPARTMENT_OTHER): Payer: Self-pay | Admitting: *Deleted

## 2017-11-08 ENCOUNTER — Other Ambulatory Visit: Payer: Self-pay

## 2017-11-08 LAB — CBC
HEMATOCRIT: 42.2 % (ref 39.0–52.0)
HEMOGLOBIN: 13.6 g/dL (ref 13.0–17.0)
MCH: 28.3 pg (ref 26.0–34.0)
MCHC: 32.2 g/dL (ref 30.0–36.0)
MCV: 87.7 fL (ref 78.0–100.0)
Platelets: 158 10*3/uL (ref 150–400)
RBC: 4.81 MIL/uL (ref 4.22–5.81)
RDW: 15.4 % (ref 11.5–15.5)
WBC: 10.4 10*3/uL (ref 4.0–10.5)

## 2017-11-08 LAB — GLUCOSE, CAPILLARY
GLUCOSE-CAPILLARY: 188 mg/dL — AB (ref 70–99)
Glucose-Capillary: 157 mg/dL — ABNORMAL HIGH (ref 70–99)
Glucose-Capillary: 222 mg/dL — ABNORMAL HIGH (ref 70–99)
Glucose-Capillary: 169 mg/dL — ABNORMAL HIGH (ref 70–99)

## 2017-11-08 LAB — CREATININE, SERUM
Creatinine, Ser: 0.77 mg/dL (ref 0.61–1.24)
GFR calc non Af Amer: >60 mL/min (ref >60)

## 2017-11-08 LAB — HEPARIN LEVEL (UNFRACTIONATED): HEPARIN UNFRACTIONATED: 0.44 [IU]/mL (ref 0.30–0.70)

## 2017-11-08 MED ORDER — POLYVINYL ALCOHOL 1.4 % OP SOLN
2.0000 [drp] | Freq: Three times a day (TID) | OPHTHALMIC | Status: DC
  Administered 2017-11-08 – 2017-11-09 (×2): 2 [drp] via OPHTHALMIC
  Filled 2017-11-08: qty 15

## 2017-11-08 MED ORDER — INSULIN DETEMIR 100 UNIT/ML ~~LOC~~ SOLN
32.0000 [IU] | Freq: Every morning | SUBCUTANEOUS | Status: DC
  Administered 2017-11-09: 32 [IU] via SUBCUTANEOUS
  Filled 2017-11-08 (×2): qty 0.32

## 2017-11-08 MED ORDER — INSULIN DETEMIR 100 UNIT/ML ~~LOC~~ SOLN
35.0000 [IU] | Freq: Every morning | SUBCUTANEOUS | Status: DC
  Filled 2017-11-08: qty 0.35

## 2017-11-08 MED ORDER — INSULIN ASPART 100 UNIT/ML ~~LOC~~ SOLN
6.0000 [IU] | Freq: Three times a day (TID) | SUBCUTANEOUS | Status: DC
  Administered 2017-11-08 – 2017-11-09 (×2): 6 [IU] via SUBCUTANEOUS

## 2017-11-08 NOTE — Discharge Summary (Signed)
Physician Discharge Summary  [name] [name] VVO:[id number] DOB: 27-Jul-1934 DOA: 11/04/2017  PCP: [name], MD  Admit date: 11/04/2017 Discharge date: 11/09/2017  Admitted From: Home Disposition:  Home   Recommendations for Outpatient Follow-up:  1. Follow up with PCP in 1-2 weeks 2. Please obtain BMP/CBC in one week     Discharge Condition: Stable CODE STATUS: FULL Diet recommendation: Heart Healthy / Carb Modified    Brief/Interim Summary: 83 y.o.malewith hx of DM2, UTI, IgA neph, HTN, afib , CLL and CAD direct admit from Dr Deeann Saint ENT office for mastoiditis/ OM w/ facial nerve palsy failing outpt abx.  Per pt's wife the recent history as follows:  09/28/17- has cyst removal from the R ear 10/04/17 - returned for removal of the packing 10/16/17 - throbbing pain in R ear, went to ED , CT scan done, went homewith clindamycin 10/17/17 - returned to ED w/ R facial droop, "Bell's palsy" diagnosed and started on prednisone taper 11/04/17 - seen in Dr Deeann Saint office today, not getting better on oral Rx , plan from hosp admit for IV abx , let coumadin wear off and do surgery (mastoidectomy) next week(11/10/17)    Discharge Diagnoses:  Acute mastoiditis/right otitis media -Failed outpatient antibiotics--outpatient clindamycin -Continue ceftriaxone and metronidazole -d/c home with cefdinir and doxy x 9 more days to complete 14 days of tx -ContinueIV vancomycin primarily for mastoiditis -surgical debridement scheduled for 11/10/17  Virl Axe Syndrome -finished valtrex and prednisone as outpt  chronicatrial fibrillation -Holding Coumadin for surgery-->restart when cleared by ENT -Started IV heparin until time of discharge  -continue digoxin  Diabetes mellitus type 2, uncontrolled with hyperglycemia -increased levemir to 32 units-->pt will discharge with his pre-admission dose of levemir (60 units) -add novolog 6 units with meals -continue ISS -check  A1C--10.2 -restart actos and victoza after d/c  Essential hypertension -Continue lisinopril -restarted furosemide  Chronic diastolic CHF -restart lasix -daily weights  Nonobstructive CAD -no anginal symptoms       Discharge Instructions   Allergies as of 11/09/2017      Reactions   Erythromycin Other (See Comments)   Penicillins Hives, Other (See Comments)   Has patient had a PCN reaction causing immediate rash, facial/tongue/throat swelling, SOB or lightheadedness with hypotension: No Has patient had a PCN reaction causing severe rash involving mucus membranes or skin necrosis: No Has patient had a PCN reaction that required hospitalization No Has patient had a PCN reaction occurring within the last 10 years: No If all of the above answers are "NO", then may proceed with Cephalosporin use. Has tolerated Rocephin      Medication List    TAKE these medications   atorvastatin 10 MG tablet Commonly known as:  LIPITOR Take 10 mg by mouth every evening.   CALCIUM 600+D 600-800 MG-UNIT Tabs Generic drug:  Calcium Carb-Cholecalciferol Take 2 tablets by mouth daily with breakfast.   cefdinir 300 MG capsule Commonly known as:  OMNICEF Take 1 capsule (300 mg total) by mouth 2 (two) times daily.   cholecalciferol 1000 units tablet Commonly known as:  VITAMIN D Take 1,000 Units by mouth every morning.   Co Q-10 100 MG Caps Take 100 mg by mouth daily with breakfast.   digoxin 0.125 MG tablet Commonly known as:  LANOXIN Take 0.125 mg by mouth daily with breakfast.   doxycycline 100 MG tablet Commonly known as:  VIBRA-TABS Take 1 tablet (100 mg total) by mouth 2 (two) times daily.   finasteride 5 MG tablet Commonly  known as:  PROSCAR Take 5 mg by mouth daily.   furosemide 20 MG tablet Commonly known as:  LASIX Take 2 pills a day What changed:    how much to take  how to take this  when to take this  reasons to take this  additional instructions    glimepiride 2 MG tablet Commonly known as:  AMARYL Take 2 mg by mouth daily with breakfast.   Insulin Detemir 100 UNIT/ML Pen Commonly known as:  LEVEMIR FLEXPEN Inject 60 Units into the skin every morning. What changed:  how much to take   lisinopril 20 MG tablet Commonly known as:  PRINIVIL,ZESTRIL Take 40 mg by mouth daily with supper.   LORazepam 1 MG tablet Commonly known as:  ATIVAN Take 1 tablet by mouth at bedtime.   multivitamin with minerals Tabs tablet Take 1 tablet by mouth daily with breakfast.   NOVOLOG FLEXPEN 100 UNIT/ML FlexPen Generic drug:  insulin aspart Inject 5-18 Units into the skin 3 (three) times daily with meals. Pt uses per sliding scale before lunch and dinner.   pioglitazone 15 MG tablet Commonly known as:  ACTOS Take 15 mg by mouth every evening.   VICTOZA 18 MG/3ML Sopn Generic drug:  liraglutide Inject 1.8 mg into the skin daily with breakfast.   vitamin C 1000 MG tablet Take 1,000 mg by mouth daily with breakfast.   warfarin 7.5 MG tablet Commonly known as:  COUMADIN Take as directed. If you are unsure how to take this medication, talk to your nurse or doctor. Original instructions:  Take 7.5 mg by mouth daily.       Allergies  Allergen Reactions  . Erythromycin Other (See Comments)  . Penicillins Hives and Other (See Comments)    Has patient had a PCN reaction causing immediate rash, facial/tongue/throat swelling, SOB or lightheadedness with hypotension: No Has patient had a PCN reaction causing severe rash involving mucus membranes or skin necrosis: No Has patient had a PCN reaction that required hospitalization No Has patient had a PCN reaction occurring within the last 10 years: No If all of the above answers are "NO", then may proceed with Cephalosporin use.  Has tolerated Rocephin    Consultations:  none   Procedures/Studies: Ct Head Wo Contrast  Result Date: 10/16/2017 CLINICAL DATA:  Right-sided ear pain and  headache for 2 weeks after surgery. EXAM: CT HEAD WITHOUT CONTRAST CT TEMPORAL BONES WITH CONTRAST TECHNIQUE: Contiguous axial images were obtained from the base of the skull through the vertex without contrast. Multidetector CT imaging of the temporal bones was performed using the standard protocol with intravenous contrast. CONTRAST: 75 mL Omnipaque 300 COMPARISON:  Head CT 06/02/2007 FINDINGS: CT HEAD FINDINGS Brain: There is no mass, hemorrhage or extra-axial collection. There is generalized atrophy without lobar predilection. There is no acute or chronic infarction. The brain parenchyma is normal. Vascular: No abnormal hyperdensity of the major intracranial arteries or dural venous sinuses. No intracranial atherosclerosis. Skull: The visualized skull base, calvarium and extracranial soft tissues are normal. Sinuses/Orbits: No fluid levels or advanced mucosal thickening of the visualized paranasal sinuses. The orbits are normal. CT TEMPORAL BONES FINDINGS RIGHT: --Pinna and external auditory canal: Normal. --Ossicular chain: Normal. No erosion or dislocation. --Tympanic membrane: Normal. --Middle ear: Small amounts of intermediate density material within the middle ear adjacent to the tympanic membrane. --Epitympanum: Minimal opacification within the epitympanum. The scutum is sharp. Tegmen tympani is intact. --Cochlea, vestibule, vestibular aqueduct and semicircular canals: Normal. No evidence  of canal dehiscence or otospongiosis. --Internal auditory canal: Normal. No widening of the porus acusticus. --Facial nerve: No focal abnormality along the course of the facial nerve. --Cerebellopontine angle: Normal. --Petrous Apex: Normal. --Mastoids: Partial opacification of the mastoid air cells. No coalescence. --Carotid canal: Normal position. LEFT: --Pinna and external auditory canal: Normal. --Ossicular chain: Normal. No erosion or dislocation. --Tympanic membrane: Normal. --Middle ear: Normal. --Epitympanum: The  Prussak space is clear. The scutum is sharp. Tegmen tympani is intact. --Cochlea, vestibule, vestibular aqueduct and semicircular canals: Normal. No evidence of canal dehiscence or otospongiosis. --Internal auditory canal: Normal. No widening of the porus acusticus. --Facial nerve: No focal abnormality along the course of the facial nerve. --Cerebellopontine angle: Normal. --Petrous Apex: Normal. --Mastoids: Normal. --Carotid canal: Normal position. OTHER: --Nasopharynx: Clear. --Temporomandibular joints: Normal. IMPRESSION: 1. Mild brain parenchymal volume loss without acute intracranial abnormality. 2. Small right mastoid effusion without coalescence. Minimal opacification of the middle ear. No acute findings. 3. Normal left temporal bone structures. Electronically Signed   By: Ulyses Jarred M.D.   On: 10/16/2017 03:45   Mr Jeri Cos ZO Contrast  Result Date: 10/26/2017 CLINICAL DATA:  RIGHT external auditory canal abscess drainage. RIGHT facial droop, history of Bell's palsy. History of hypertension, hyperlipidemia, diabetes, leukemia. EXAM: MRI HEAD WITHOUT AND WITH CONTRAST TECHNIQUE: Multiplanar, multiecho pulse sequences of the brain and surrounding structures were obtained without and with intravenous contrast. CONTRAST:  53mL MULTIHANCE GADOBENATE DIMEGLUMINE 529 MG/ML IV SOLN COMPARISON:  CT temporal bones October 16, 2017 FINDINGS: INTERNAL AUDITORY CANALS: No cerebellar pontine angle masses. Normal course, caliber and signal of the bilateral seventh and eighth cranial nerves without abnormal enhancement. Symmetric, normal internal auditory canals. Normal appearance of the inner ear structures. INTRACRANIAL CONTENTS: No reduced diffusion to suggest acute ischemia, typical infection or hypercellular tumor. No susceptibility artifact to suggest hemorrhage. The ventricles and sulci are normal for patient's age. A few subcentimeter supratentorial white matter FLAIR T2 hyperintensities seen with chronic small  vessel ischemic changes, less than expected for age. No suspicious parenchymal signal, mass lesions, mass effect. No abnormal intraparenchymal or extra-axial enhancement. No abnormal extra-axial fluid collections. No extra-axial masses. VASCULAR: Normal major intracranial vascular flow voids present at skull base. Patent dural venous sinuses though not tailored for evaluation. SKULL AND UPPER CERVICAL SPINE: No abnormal sellar expansion. No suspicious calvarial bone marrow signal. Craniocervical junction maintained. SINUSES/ ORBITS: RIGHT mastoid effusion. Partially imaged right mastoid tip 11 mm rim enhancing fluid collection with patchy right inferior mastoid enhancement. Paranasal sinuses are well aerated. The included ocular globes and orbital contents are non-suspicious. Status post bilateral ocular lens implants. OTHER: Abnormally thickened right external auditory canal soft tissues with homogeneous enhancement. Soft tissue enhancement about the mastoid tip (series 18, image 7) and may affect the extracranial facial nerve. IMPRESSION: 1. Normal MRI of the head with without contrast. 2. Subcentimeter fluid collections RIGHT mastoid air cells concerning for abscess. RIGHT mastoiditis. Enhancement about the mastoid tip consistent with cellulitis and potential early Bezold abscess, incompletely evaluated. 3. RIGHT otitis externus. 4. These results will be called to the ordering clinician or representative by the Radiologist Assistant, and communication documented in the PACS or zVision Dashboard. Electronically Signed   By: Elon Alas M.D.   On: 10/26/2017 19:23   Ct Temporal Bones W Contrast  Result Date: 10/16/2017 CLINICAL DATA:  Right-sided ear pain and headache for 2 weeks after surgery. EXAM: CT HEAD WITHOUT CONTRAST CT TEMPORAL BONES WITH CONTRAST TECHNIQUE: Contiguous axial images were  obtained from the base of the skull through the vertex without contrast. Multidetector CT imaging of the  temporal bones was performed using the standard protocol with intravenous contrast. CONTRAST: 75 mL Omnipaque 300 COMPARISON:  Head CT 06/02/2007 FINDINGS: CT HEAD FINDINGS Brain: There is no mass, hemorrhage or extra-axial collection. There is generalized atrophy without lobar predilection. There is no acute or chronic infarction. The brain parenchyma is normal. Vascular: No abnormal hyperdensity of the major intracranial arteries or dural venous sinuses. No intracranial atherosclerosis. Skull: The visualized skull base, calvarium and extracranial soft tissues are normal. Sinuses/Orbits: No fluid levels or advanced mucosal thickening of the visualized paranasal sinuses. The orbits are normal. CT TEMPORAL BONES FINDINGS RIGHT: --Pinna and external auditory canal: Normal. --Ossicular chain: Normal. No erosion or dislocation. --Tympanic membrane: Normal. --Middle ear: Small amounts of intermediate density material within the middle ear adjacent to the tympanic membrane. --Epitympanum: Minimal opacification within the epitympanum. The scutum is sharp. Tegmen tympani is intact. --Cochlea, vestibule, vestibular aqueduct and semicircular canals: Normal. No evidence of canal dehiscence or otospongiosis. --Internal auditory canal: Normal. No widening of the porus acusticus. --Facial nerve: No focal abnormality along the course of the facial nerve. --Cerebellopontine angle: Normal. --Petrous Apex: Normal. --Mastoids: Partial opacification of the mastoid air cells. No coalescence. --Carotid canal: Normal position. LEFT: --Pinna and external auditory canal: Normal. --Ossicular chain: Normal. No erosion or dislocation. --Tympanic membrane: Normal. --Middle ear: Normal. --Epitympanum: The Prussak space is clear. The scutum is sharp. Tegmen tympani is intact. --Cochlea, vestibule, vestibular aqueduct and semicircular canals: Normal. No evidence of canal dehiscence or otospongiosis. --Internal auditory canal: Normal. No widening  of the porus acusticus. --Facial nerve: No focal abnormality along the course of the facial nerve. --Cerebellopontine angle: Normal. --Petrous Apex: Normal. --Mastoids: Normal. --Carotid canal: Normal position. OTHER: --Nasopharynx: Clear. --Temporomandibular joints: Normal. IMPRESSION: 1. Mild brain parenchymal volume loss without acute intracranial abnormality. 2. Small right mastoid effusion without coalescence. Minimal opacification of the middle ear. No acute findings. 3. Normal left temporal bone structures. Electronically Signed   By: Ulyses Jarred M.D.   On: 10/16/2017 03:45        Discharge Exam: Vitals:   11/08/17 2142 11/09/17 0622  BP: 126/66 (!) 118/44  Pulse: 67 72  Resp:    Temp: 98.1 F (36.7 C) 98.4 F (36.9 C)  SpO2: 98% 96%   Vitals:   11/08/17 1443 11/08/17 2142 11/09/17 0500 11/09/17 0622  BP: 122/62 126/66  (!) 118/44  Pulse: 76 67  72  Resp: 18     Temp: 98.7 F (37.1 C) 98.1 F (36.7 C)  98.4 F (36.9 C)  TempSrc: Oral Oral  Oral  SpO2: 98% 98%  96%  Weight:   123.6 kg (272 lb 7.8 oz)   Height:        General: Pt is alert, awake, not in acute distress Cardiovascular: RRR, S1/S2 +, no rubs, no gallops Respiratory: CTA bilaterally, no wheezing, no rhonchi Abdominal: Soft, NT, ND, bowel sounds + Extremities: no edema, no cyanosis   The results of significant diagnostics from this hospitalization (including imaging, microbiology, ancillary and laboratory) are listed below for reference.    Significant Diagnostic Studies: Ct Head Wo Contrast  Result Date: 10/16/2017 CLINICAL DATA:  Right-sided ear pain and headache for 2 weeks after surgery. EXAM: CT HEAD WITHOUT CONTRAST CT TEMPORAL BONES WITH CONTRAST TECHNIQUE: Contiguous axial images were obtained from the base of the skull through the vertex without contrast. Multidetector CT imaging of the  temporal bones was performed using the standard protocol with intravenous contrast. CONTRAST: 75 mL Omnipaque  300 COMPARISON:  Head CT 06/02/2007 FINDINGS: CT HEAD FINDINGS Brain: There is no mass, hemorrhage or extra-axial collection. There is generalized atrophy without lobar predilection. There is no acute or chronic infarction. The brain parenchyma is normal. Vascular: No abnormal hyperdensity of the major intracranial arteries or dural venous sinuses. No intracranial atherosclerosis. Skull: The visualized skull base, calvarium and extracranial soft tissues are normal. Sinuses/Orbits: No fluid levels or advanced mucosal thickening of the visualized paranasal sinuses. The orbits are normal. CT TEMPORAL BONES FINDINGS RIGHT: --Pinna and external auditory canal: Normal. --Ossicular chain: Normal. No erosion or dislocation. --Tympanic membrane: Normal. --Middle ear: Small amounts of intermediate density material within the middle ear adjacent to the tympanic membrane. --Epitympanum: Minimal opacification within the epitympanum. The scutum is sharp. Tegmen tympani is intact. --Cochlea, vestibule, vestibular aqueduct and semicircular canals: Normal. No evidence of canal dehiscence or otospongiosis. --Internal auditory canal: Normal. No widening of the porus acusticus. --Facial nerve: No focal abnormality along the course of the facial nerve. --Cerebellopontine angle: Normal. --Petrous Apex: Normal. --Mastoids: Partial opacification of the mastoid air cells. No coalescence. --Carotid canal: Normal position. LEFT: --Pinna and external auditory canal: Normal. --Ossicular chain: Normal. No erosion or dislocation. --Tympanic membrane: Normal. --Middle ear: Normal. --Epitympanum: The Prussak space is clear. The scutum is sharp. Tegmen tympani is intact. --Cochlea, vestibule, vestibular aqueduct and semicircular canals: Normal. No evidence of canal dehiscence or otospongiosis. --Internal auditory canal: Normal. No widening of the porus acusticus. --Facial nerve: No focal abnormality along the course of the facial nerve.  --Cerebellopontine angle: Normal. --Petrous Apex: Normal. --Mastoids: Normal. --Carotid canal: Normal position. OTHER: --Nasopharynx: Clear. --Temporomandibular joints: Normal. IMPRESSION: 1. Mild brain parenchymal volume loss without acute intracranial abnormality. 2. Small right mastoid effusion without coalescence. Minimal opacification of the middle ear. No acute findings. 3. Normal left temporal bone structures. Electronically Signed   By: Ulyses Jarred M.D.   On: 10/16/2017 03:45   Mr Jeri Cos RC Contrast  Result Date: 10/26/2017 CLINICAL DATA:  RIGHT external auditory canal abscess drainage. RIGHT facial droop, history of Bell's palsy. History of hypertension, hyperlipidemia, diabetes, leukemia. EXAM: MRI HEAD WITHOUT AND WITH CONTRAST TECHNIQUE: Multiplanar, multiecho pulse sequences of the brain and surrounding structures were obtained without and with intravenous contrast. CONTRAST:  68mL MULTIHANCE GADOBENATE DIMEGLUMINE 529 MG/ML IV SOLN COMPARISON:  CT temporal bones October 16, 2017 FINDINGS: INTERNAL AUDITORY CANALS: No cerebellar pontine angle masses. Normal course, caliber and signal of the bilateral seventh and eighth cranial nerves without abnormal enhancement. Symmetric, normal internal auditory canals. Normal appearance of the inner ear structures. INTRACRANIAL CONTENTS: No reduced diffusion to suggest acute ischemia, typical infection or hypercellular tumor. No susceptibility artifact to suggest hemorrhage. The ventricles and sulci are normal for patient's age. A few subcentimeter supratentorial white matter FLAIR T2 hyperintensities seen with chronic small vessel ischemic changes, less than expected for age. No suspicious parenchymal signal, mass lesions, mass effect. No abnormal intraparenchymal or extra-axial enhancement. No abnormal extra-axial fluid collections. No extra-axial masses. VASCULAR: Normal major intracranial vascular flow voids present at skull base. Patent dural venous sinuses  though not tailored for evaluation. SKULL AND UPPER CERVICAL SPINE: No abnormal sellar expansion. No suspicious calvarial bone marrow signal. Craniocervical junction maintained. SINUSES/ ORBITS: RIGHT mastoid effusion. Partially imaged right mastoid tip 11 mm rim enhancing fluid collection with patchy right inferior mastoid enhancement. Paranasal sinuses are well aerated. The included ocular globes  and orbital contents are non-suspicious. Status post bilateral ocular lens implants. OTHER: Abnormally thickened right external auditory canal soft tissues with homogeneous enhancement. Soft tissue enhancement about the mastoid tip (series 18, image 7) and may affect the extracranial facial nerve. IMPRESSION: 1. Normal MRI of the head with without contrast. 2. Subcentimeter fluid collections RIGHT mastoid air cells concerning for abscess. RIGHT mastoiditis. Enhancement about the mastoid tip consistent with cellulitis and potential early Bezold abscess, incompletely evaluated. 3. RIGHT otitis externus. 4. These results will be called to the ordering clinician or representative by the Radiologist Assistant, and communication documented in the PACS or zVision Dashboard. Electronically Signed   By: Elon Alas M.D.   On: 10/26/2017 19:23   Ct Temporal Bones W Contrast  Result Date: 10/16/2017 CLINICAL DATA:  Right-sided ear pain and headache for 2 weeks after surgery. EXAM: CT HEAD WITHOUT CONTRAST CT TEMPORAL BONES WITH CONTRAST TECHNIQUE: Contiguous axial images were obtained from the base of the skull through the vertex without contrast. Multidetector CT imaging of the temporal bones was performed using the standard protocol with intravenous contrast. CONTRAST: 75 mL Omnipaque 300 COMPARISON:  Head CT 06/02/2007 FINDINGS: CT HEAD FINDINGS Brain: There is no mass, hemorrhage or extra-axial collection. There is generalized atrophy without lobar predilection. There is no acute or chronic infarction. The brain  parenchyma is normal. Vascular: No abnormal hyperdensity of the major intracranial arteries or dural venous sinuses. No intracranial atherosclerosis. Skull: The visualized skull base, calvarium and extracranial soft tissues are normal. Sinuses/Orbits: No fluid levels or advanced mucosal thickening of the visualized paranasal sinuses. The orbits are normal. CT TEMPORAL BONES FINDINGS RIGHT: --Pinna and external auditory canal: Normal. --Ossicular chain: Normal. No erosion or dislocation. --Tympanic membrane: Normal. --Middle ear: Small amounts of intermediate density material within the middle ear adjacent to the tympanic membrane. --Epitympanum: Minimal opacification within the epitympanum. The scutum is sharp. Tegmen tympani is intact. --Cochlea, vestibule, vestibular aqueduct and semicircular canals: Normal. No evidence of canal dehiscence or otospongiosis. --Internal auditory canal: Normal. No widening of the porus acusticus. --Facial nerve: No focal abnormality along the course of the facial nerve. --Cerebellopontine angle: Normal. --Petrous Apex: Normal. --Mastoids: Partial opacification of the mastoid air cells. No coalescence. --Carotid canal: Normal position. LEFT: --Pinna and external auditory canal: Normal. --Ossicular chain: Normal. No erosion or dislocation. --Tympanic membrane: Normal. --Middle ear: Normal. --Epitympanum: The Prussak space is clear. The scutum is sharp. Tegmen tympani is intact. --Cochlea, vestibule, vestibular aqueduct and semicircular canals: Normal. No evidence of canal dehiscence or otospongiosis. --Internal auditory canal: Normal. No widening of the porus acusticus. --Facial nerve: No focal abnormality along the course of the facial nerve. --Cerebellopontine angle: Normal. --Petrous Apex: Normal. --Mastoids: Normal. --Carotid canal: Normal position. OTHER: --Nasopharynx: Clear. --Temporomandibular joints: Normal. IMPRESSION: 1. Mild brain parenchymal volume loss without acute  intracranial abnormality. 2. Small right mastoid effusion without coalescence. Minimal opacification of the middle ear. No acute findings. 3. Normal left temporal bone structures. Electronically Signed   By: Ulyses Jarred M.D.   On: 10/16/2017 03:45     Microbiology: No results found for this or any previous visit (from the past 240 hour(s)).   Labs: Basic Metabolic Panel: Recent Labs  Lab 11/04/17 2028 11/06/17 0713 11/07/17 0735 11/08/17 0553  NA 136 139 137  --   K 4.1 4.6 4.3  --   CL 100 104 104  --   CO2 28 28 27   --   GLUCOSE 140* 211* 154*  --  BUN 28* 21 19  --   CREATININE 0.97 0.90 0.84 0.77  CALCIUM 9.8 9.0 8.5*  --    Liver Function Tests: Recent Labs  Lab 11/04/17 2028  AST 18  ALT 20  ALKPHOS 86  BILITOT 1.3*  PROT 6.1*  ALBUMIN 3.5   No results for input(s): LIPASE, AMYLASE in the last 168 hours. No results for input(s): AMMONIA in the last 168 hours. CBC: Recent Labs  Lab 11/04/17 2028 11/06/17 0713 11/07/17 0735 11/08/17 0553 11/09/17 0525  WBC 12.1* 10.6* 9.8 10.4 10.2  NEUTROABS 3.6  --   --   --   --   HGB 14.8 14.7 13.5 13.6 13.0  HCT 43.3 44.7 41.3 42.2 40.7  MCV 86.4 87.8 87.5 87.7 87.7  PLT 165 168 148* 158 156   Cardiac Enzymes: No results for input(s): CKTOTAL, CKMB, CKMBINDEX, TROPONINI in the last 168 hours. BNP: Invalid input(s): POCBNP CBG: Recent Labs  Lab 11/08/17 0753 11/08/17 1211 11/08/17 1629 11/08/17 2143 11/09/17 0751  GLUCAP 157* 222* 188* 169* 206*    Time coordinating discharge:  36 minutes  Signed:  Orson Eva, DO [facility] Pager: [phone] 11/09/2017, 10:33 AM

## 2017-11-08 NOTE — Progress Notes (Signed)
Inpatient Diabetes Program Recommendations  AACE/ADA: New Consensus Statement on Inpatient Glycemic Control (2015)  Target Ranges:  Prepandial:   less than 140 mg/dL      Peak postprandial:   less than 180 mg/dL (1-2 hours)      Critically ill patients:  140 - 180 mg/dL   Results for [name] [name], [name] [name] (MRN [id number]) as of 11/08/2017 14:19  Ref. Range 11/07/2017 07:24 11/07/2017 11:13 11/07/2017 16:07 11/07/2017 21:55  Glucose-Capillary Latest Ref Range: 70 - 99 mg/dL 138 (H) 260 (H) 212 (H) 152 (H)   Results for [name] [name], [name] [name] (MRN [id number]) as of 11/08/2017 14:19  Ref. Range 11/08/2017 07:53 11/08/2017 12:11  Glucose-Capillary Latest Ref Range: 70 - 99 mg/dL 157 (H) 222 (H)   Review of Glycemic Control  Diabetes history: DM 2 Outpatient Diabetes medications: Amaryl 2 mg QD, Levemir 20 units daily/ Victoza 1.8 mg daily, Novolog 5-18 units tid, Actos 15 mg every evening Current orders for Inpatient glycemic control: Levemir 30 units Daily, Novolog 0-20 units tid, Novolog 0-5 units qhs, Novolog 4 units tid meal coverage  Inpatient Diabetes Program Recommendations:    Glucose trends increase after meals with Novolog 4 units of meal coverage. Please consider increasing Novolog meal coverage to 6 units tid.   Thanks,  Tama Headings RN, MSN, BC-ADM Inpatient Diabetes Coordinator Team Pager [phone] (8a-5p)

## 2017-11-08 NOTE — Care Management Important Message (Signed)
Important Message  Patient Details  Name: [name] [name] MRN: [id number] Date of Birth: 02-15-1935   Medicare Important Message Given:  Yes    [name] 11/08/2017, 12:00 PM

## 2017-11-08 NOTE — Progress Notes (Signed)
Patient BP 129/46 this am with morning medications. Notified Dr. Carles Collet to make sure okay to give lasix 40 mg po as ordered. Received call back from Dr. Carles Collet stating okay to give lasix as ordered. Donavan Foil, RN

## 2017-11-08 NOTE — Progress Notes (Signed)
ANTICOAGULATION CONSULT NOTE - Initial Consult  Pharmacy Consult for heparin (warfarin on hold) Indication: atrial fibrillation  Allergies  Allergen Reactions  . Erythromycin Other (See Comments)  . Penicillins Hives and Other (See Comments)    Has patient had a PCN reaction causing immediate rash, facial/tongue/throat swelling, SOB or lightheadedness with hypotension: No Has patient had a PCN reaction causing severe rash involving mucus membranes or skin necrosis: No Has patient had a PCN reaction that required hospitalization No Has patient had a PCN reaction occurring within the last 10 years: No If all of the above answers are "NO", then may proceed with Cephalosporin use.  Has tolerated Rocephin    Patient Measurements: Height: 6' (182.9 cm) Weight: 269 lb 8 oz (122.2 kg) IBW/kg (Calculated) : 77.6 Heparin Dosing Weight: 104 kg  Vital Signs: Temp: 97.7 F (36.5 C) (08/05 0602) Temp Source: Oral (08/05 0602) BP: 106/40 (08/05 0602) Pulse Rate: 65 (08/05 0602)  Labs: Recent Labs    11/06/17 0713 11/06/17 2030 11/07/17 0735 11/08/17 0553  HGB 14.7  --  13.5 13.6  HCT 44.7  --  41.3 42.2  PLT 168  --  148* 158  LABPROT 20.3*  --   --   --   INR 1.75  --   --   --   HEPARINUNFRC  --  0.43 0.42 0.44  CREATININE 0.90  --  0.84 0.77    Estimated Creatinine Clearance: 97.7 mL/min (by C-G formula based on SCr of 0.77 mg/dL).   Medical History: Past Medical History:  Diagnosis Date  . Atrial fibrillation ([place])   . Chronic lymphocytic leukemia ([place])   . Coronary atherosclerosis of native coronary artery    Nonobstructive  . Essential hypertension, benign   . Hyperlipidemia   . IgA nephropathy    Dr. Lowanda Foster  . Type 2 diabetes mellitus ([place])   . UTI (urinary tract infection) july  10 th   taking  med     Medications:  Medications Prior to Admission  Medication Sig Dispense Refill Last Dose  . Ascorbic Acid (VITAMIN C) 1000 MG tablet Take 1,000 mg by mouth  daily with breakfast.    11/04/2017 at 0800  . Calcium Carb-Cholecalciferol (CALCIUM 600+D) 600-800 MG-UNIT TABS Take 2 tablets by mouth daily with breakfast.   11/04/2017 at 0800  . cholecalciferol (VITAMIN D) 1000 units tablet Take 1,000 Units by mouth every morning.   11/04/2017 at 0800  . Coenzyme Q10 (CO Q-10) 100 MG CAPS Take 100 mg by mouth daily with breakfast.    11/04/2017 at 0800  . digoxin (LANOXIN) 0.125 MG tablet Take 0.125 mg by mouth daily with breakfast.    11/04/2017 at 0800  . finasteride (PROSCAR) 5 MG tablet Take 5 mg by mouth daily.   11/04/2017 at 0800  . furosemide (LASIX) 20 MG tablet Take 2 pills a day (Patient taking differently: Take 20-40 mg by mouth daily as needed. Take 2 pills a day) 20 tablet 0 11/04/2017 at Unknown time  . glimepiride (AMARYL) 2 MG tablet Take 2 mg by mouth daily with breakfast.    11/04/2017 at 0800  . Insulin Detemir (LEVEMIR FLEXPEN) 100 UNIT/ML Pen Inject 20 Units into the skin every morning.   11/04/2017 at 0800  . lisinopril (PRINIVIL,ZESTRIL) 20 MG tablet Take 40 mg by mouth daily with supper.   11/04/2017 at 0800  . Multiple Vitamin (MULTIVITAMIN WITH MINERALS) TABS tablet Take 1 tablet by mouth daily with breakfast.   11/04/2017 at 0800  .  oxyCODONE-acetaminophen (PERCOCET/ROXICET) 5-325 MG tablet Take 1 tablet by mouth every 4 (four) hours as needed for severe pain. 20 tablet 0 Past Week at Unknown time  . VICTOZA 18 MG/3ML SOPN Inject 1.8 mg into the skin daily with breakfast.   11/04/2017 at 0800  . atorvastatin (LIPITOR) 10 MG tablet Take 10 mg by mouth every evening.   11/03/2017  . clindamycin (CLEOCIN) 300 MG capsule Take 1 capsule (300 mg total) by mouth 4 (four) times daily. X 7 days (Patient not taking: Reported on 11/05/2017) 28 capsule 0 Completed Course at Unknown time  . insulin aspart (NOVOLOG FLEXPEN) 100 UNIT/ML FlexPen Inject 5-18 Units into the skin 3 (three) times daily with meals. Pt uses per sliding scale before lunch and dinner.   11/03/2017  .  LORazepam (ATIVAN) 1 MG tablet Take 1 tablet by mouth at bedtime.    11/03/2017  . pioglitazone (ACTOS) 15 MG tablet Take 15 mg by mouth every evening.    11/03/2017  . predniSONE (DELTASONE) 10 MG tablet Take 1 tablet (10 mg total) by mouth daily with breakfast. 2 tablets for 5 days, 1 tablet for 5 days, 1/2 tablet for 5 days. (Patient not taking: Reported on 11/05/2017) 18 tablet 0 Completed Course at Unknown time  . valACYclovir (VALTREX) 1000 MG tablet Take 1,000 mg by mouth 2 (two) times daily. for 7 days  0 Completed Course at Unknown time  . warfarin (COUMADIN) 7.5 MG tablet Take 7.5 mg by mouth daily.   Not Taking at Unknown time    Assessment: Pharmacy consulted to dose transition to heparin IV from warfarin for patient getting surgery next week. . Heparin level is therapeutic at 0.44.  Goal of Therapy:  Heparin level 0.3-0.7 units/ml Monitor platelets by anticoagulation protocol: Yes   Plan:  Continue heparin infusion at 1500 units/hr Check anti-Xa level daily while on heparin Continue to monitor H&H and platelets    Ramond Craver 11/08/2017,8:02 AM

## 2017-11-08 NOTE — Progress Notes (Signed)
PROGRESS NOTE  [name] [name] YNW:[id number] DOB: [birth year] DOA: 11/04/2017 PCP: [name], MD  Brief History: 83 y.o.malewith hx of DM2, UTI, IgA neph, HTN, afib , CLL and CAD direct admit from Dr Deeann Saint ENT office for mastoiditis/ OM w/ facial nerve palsy failing outpt abx.  Per pt's wife the recent history as follows:  09/28/17- has cyst removal from the R ear 10/04/17 - returned for removal of the packing 10/16/17 - throbbing pain in R ear, went to ED , CT scan done, went homewith clindamycin 10/17/17 - returned to ED w/ R facial droop, "Bell's palsy" diagnosed and started on prednisone taper 11/04/17 - seen in Dr Deeann Saint office today, not getting better on oral Rx , plan from hosp admit for IV abx , let coumadin wear off and do surgery (mastoidectomy) next week(11/10/17)   Assessment/Plan: Acute mastoiditis/right otitis media -Failed outpatient antibiotics--outpatient clindamycin -Continue ceftriaxone and metronidazole -ContinueIV vancomycin primarily for mastoiditis  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 -finished valtrex and prednisone as outpt  chronicatrial fibrillation -Holding Coumadin -Started IV heparin  -continue digoxin  Diabetes mellitus type 2, uncontrolled with hyperglycemia -increase levemir to 32 units -add novolog 6 units with meals -continue ISS -check A1C--10.2  Essential hypertension -Continue lisinopril -restarted furosemide  Chronic diastolic CHF -restart lasix -daily weights  Nonobstructive CAD -no anginal symptoms    Disposition Plan: Home8/6 if stable Family Communication:Wife updatedat bedside 8/3   Consultants:none  Code Status: FULL   DVT Prophylaxis:coumadin   Procedures: As Listed in Progress Note Above  Antibiotics: vanco 8/2>>> Ceftriaxone 8/1>>> Flagyl 8/1>>>     Subjective: Overall pain in right ear area is improving.  He denies any cp , sob, n/v/d abd pain.     Objective: Vitals:   11/08/17 0602 11/08/17 0929 11/08/17 0929 11/08/17 1443  BP: (!) 106/40  (!) 129/46 122/62  Pulse: 65 79 81 76  Resp: 15  18 18   Temp: 97.7 F (36.5 C)  98.7 F (37.1 C) 98.7 F (37.1 C)  TempSrc: Oral  Oral Oral  SpO2: 98%  96% 98%  Weight:      Height:        Intake/Output Summary (Last 24 hours) at 11/08/2017 1743 Last data filed at 11/08/2017 1649 Gross per 24 hour  Intake 1127.75 ml  Output 1200 ml  Net -72.25 ml   Weight change:  Exam:   General:  Pt is alert, follows commands appropriately, not in acute distress  HEENT: No icterus, No thrush, No neck mass, [place]/AT  Cardiovascular: RRR, S1/S2, no rubs, no gallops  Respiratory: CTA bilaterally, no wheezing, no crackles, no rhonchi  Abdomen: Soft/+BS, non tender, non distended, no guarding  Extremities: No edema, No lymphangitis, No petechiae, No rashes, no synovitis   Data Reviewed: I have personally reviewed following labs and imaging studies Basic Metabolic Panel: Recent Labs  Lab 11/04/17 2028 11/06/17 0713 11/07/17 0735 11/08/17 0553  NA 136 139 137  --   K 4.1 4.6 4.3  --   CL 100 104 104  --   CO2 28 28 27   --   GLUCOSE 140* 211* 154*  --   BUN 28* 21 19  --   CREATININE 0.97 0.90 0.84 0.77  CALCIUM 9.8 9.0 8.5*  --    Liver Function Tests: Recent Labs  Lab 11/04/17 2028  AST 18  ALT 20  ALKPHOS 86  BILITOT 1.3*  PROT 6.1*  ALBUMIN 3.5   No results  for input(s): LIPASE, AMYLASE in the last 168 hours. No results for input(s): AMMONIA in the last 168 hours. Coagulation Profile: Recent Labs  Lab 11/04/17 2028 11/05/17 0425 11/06/17 0713  INR 2.62 2.34 1.75   CBC: Recent Labs  Lab 11/04/17 2028 11/06/17 0713 11/07/17 0735 11/08/17 0553  WBC 12.1* 10.6* 9.8 10.4  NEUTROABS 3.6  --   --   --   HGB 14.8 14.7 13.5 13.6  HCT 43.3 44.7 41.3 42.2  MCV 86.4 87.8 87.5 87.7  PLT 165 168 148* 158   Cardiac Enzymes: No results for input(s): CKTOTAL, CKMB,  CKMBINDEX, TROPONINI in the last 168 hours. BNP: Invalid input(s): POCBNP CBG: Recent Labs  Lab 11/07/17 1607 11/07/17 2155 11/08/17 0753 11/08/17 1211 11/08/17 1629  GLUCAP 212* 152* 157* 222* 188*   HbA1C: Recent Labs    11/07/17 0735  HGBA1C 10.2*   Urine analysis:    Component Value Date/Time   COLORURINE STRAW (A) 09/17/2016 2200   APPEARANCEUR CLEAR 09/17/2016 2200   LABSPEC 1.008 09/17/2016 2200   PHURINE 5.0 09/17/2016 2200   GLUCOSEU NEGATIVE 09/17/2016 2200   HGBUR MODERATE (A) 09/17/2016 2200   BILIRUBINUR NEGATIVE 09/17/2016 2200   KETONESUR NEGATIVE 09/17/2016 2200   PROTEINUR NEGATIVE 09/17/2016 2200   NITRITE NEGATIVE 09/17/2016 2200   LEUKOCYTESUR NEGATIVE 09/17/2016 2200   Sepsis Labs: @LABRCNTIP (procalcitonin:4,lacticidven:4) )No results found for this or any previous visit (from the past 240 hour(s)).   Scheduled Meds: . atorvastatin  10 mg Oral QPM  . digoxin  0.125 mg Oral Q breakfast  . finasteride  5 mg Oral Daily  . furosemide  40 mg Oral Daily  . insulin aspart  0-20 Units Subcutaneous TID WC  . insulin aspart  0-5 Units Subcutaneous QHS  . insulin aspart  6 Units Subcutaneous TID WC  . [START ON 11/09/2017] insulin detemir  35 Units Subcutaneous q morning - 10a  . lisinopril  40 mg Oral Q supper  . multivitamin with minerals  1 tablet Oral Q breakfast  . sodium chloride flush  3 mL Intravenous Q12H  . vitamin C  1,000 mg Oral Q breakfast   Continuous Infusions: . cefTRIAXone (ROCEPHIN)  IV Stopped (11/07/17 2127)  . heparin 1,500 Units/hr (11/08/17 0517)  . metronidazole Stopped (11/08/17 1649)  . vancomycin Stopped (11/08/17 0510)    Procedures/Studies: Ct Head Wo Contrast  Result Date: 10/16/2017 CLINICAL DATA:  Right-sided ear pain and headache for 2 weeks after surgery. EXAM: CT HEAD WITHOUT CONTRAST CT TEMPORAL BONES WITH CONTRAST TECHNIQUE: Contiguous axial images were obtained from the base of the skull through the vertex  without contrast. Multidetector CT imaging of the temporal bones was performed using the standard protocol with intravenous contrast. CONTRAST: 75 mL Omnipaque 300 COMPARISON:  Head CT 06/02/2007 FINDINGS: CT HEAD FINDINGS Brain: There is no mass, hemorrhage or extra-axial collection. There is generalized atrophy without lobar predilection. There is no acute or chronic infarction. The brain parenchyma is normal. Vascular: No abnormal hyperdensity of the major intracranial arteries or dural venous sinuses. No intracranial atherosclerosis. Skull: The visualized skull base, calvarium and extracranial soft tissues are normal. Sinuses/Orbits: No fluid levels or advanced mucosal thickening of the visualized paranasal sinuses. The orbits are normal. CT TEMPORAL BONES FINDINGS RIGHT: --Pinna and external auditory canal: Normal. --Ossicular chain: Normal. No erosion or dislocation. --Tympanic membrane: Normal. --Middle ear: Small amounts of intermediate density material within the middle ear adjacent to the tympanic membrane. --Epitympanum: Minimal opacification within the epitympanum. The scutum  is sharp. Tegmen tympani is intact. --Cochlea, vestibule, vestibular aqueduct and semicircular canals: Normal. No evidence of canal dehiscence or otospongiosis. --Internal auditory canal: Normal. No widening of the porus acusticus. --Facial nerve: No focal abnormality along the course of the facial nerve. --Cerebellopontine angle: Normal. --Petrous Apex: Normal. --Mastoids: Partial opacification of the mastoid air cells. No coalescence. --Carotid canal: Normal position. LEFT: --Pinna and external auditory canal: Normal. --Ossicular chain: Normal. No erosion or dislocation. --Tympanic membrane: Normal. --Middle ear: Normal. --Epitympanum: The Prussak space is clear. The scutum is sharp. Tegmen tympani is intact. --Cochlea, vestibule, vestibular aqueduct and semicircular canals: Normal. No evidence of canal dehiscence or  otospongiosis. --Internal auditory canal: Normal. No widening of the porus acusticus. --Facial nerve: No focal abnormality along the course of the facial nerve. --Cerebellopontine angle: Normal. --Petrous Apex: Normal. --Mastoids: Normal. --Carotid canal: Normal position. OTHER: --Nasopharynx: Clear. --Temporomandibular joints: Normal. IMPRESSION: 1. Mild brain parenchymal volume loss without acute intracranial abnormality. 2. Small right mastoid effusion without coalescence. Minimal opacification of the middle ear. No acute findings. 3. Normal left temporal bone structures. Electronically Signed   By: Ulyses Jarred M.D.   On: 10/16/2017 03:45   Mr Jeri Cos [place] Contrast  Result Date: 10/26/2017 CLINICAL DATA:  RIGHT external auditory canal abscess drainage. RIGHT facial droop, history of Bell's palsy. History of hypertension, hyperlipidemia, diabetes, leukemia. EXAM: MRI HEAD WITHOUT AND WITH CONTRAST TECHNIQUE: Multiplanar, multiecho pulse sequences of the brain and surrounding structures were obtained without and with intravenous contrast. CONTRAST:  97mL MULTIHANCE GADOBENATE DIMEGLUMINE 529 MG/ML IV SOLN COMPARISON:  CT temporal bones October 16, 2017 FINDINGS: INTERNAL AUDITORY CANALS: No cerebellar pontine angle masses. Normal course, caliber and signal of the bilateral seventh and eighth cranial nerves without abnormal enhancement. Symmetric, normal internal auditory canals. Normal appearance of the inner ear structures. INTRACRANIAL CONTENTS: No reduced diffusion to suggest acute ischemia, typical infection or hypercellular tumor. No susceptibility artifact to suggest hemorrhage. The ventricles and sulci are normal for patient's age. A few subcentimeter supratentorial white matter FLAIR T2 hyperintensities seen with chronic small vessel ischemic changes, less than expected for age. No suspicious parenchymal signal, mass lesions, mass effect. No abnormal intraparenchymal or extra-axial enhancement. No abnormal  extra-axial fluid collections. No extra-axial masses. VASCULAR: Normal major intracranial vascular flow voids present at skull base. Patent dural venous sinuses though not tailored for evaluation. SKULL AND UPPER CERVICAL SPINE: No abnormal sellar expansion. No suspicious calvarial bone marrow signal. Craniocervical junction maintained. SINUSES/ ORBITS: RIGHT mastoid effusion. Partially imaged right mastoid tip 11 mm rim enhancing fluid collection with patchy right inferior mastoid enhancement. Paranasal sinuses are well aerated. The included ocular globes and orbital contents are non-suspicious. Status post bilateral ocular lens implants. OTHER: Abnormally thickened right external auditory canal soft tissues with homogeneous enhancement. Soft tissue enhancement about the mastoid tip (series 18, image 7) and may affect the extracranial facial nerve. IMPRESSION: 1. Normal MRI of the head with without contrast. 2. Subcentimeter fluid collections RIGHT mastoid air cells concerning for abscess. RIGHT mastoiditis. Enhancement about the mastoid tip consistent with cellulitis and potential early Bezold abscess, incompletely evaluated. 3. RIGHT otitis externus. 4. These results will be called to the ordering clinician or representative by the Radiologist Assistant, and communication documented in the PACS or zVision Dashboard. Electronically Signed   By: Elon Alas M.D.   On: 10/26/2017 19:23   Ct Temporal Bones W Contrast  Result Date: 10/16/2017 CLINICAL DATA:  Right-sided ear pain and headache for 2 weeks after  surgery. EXAM: CT HEAD WITHOUT CONTRAST CT TEMPORAL BONES WITH CONTRAST TECHNIQUE: Contiguous axial images were obtained from the base of the skull through the vertex without contrast. Multidetector CT imaging of the temporal bones was performed using the standard protocol with intravenous contrast. CONTRAST: 75 mL Omnipaque 300 COMPARISON:  Head CT 06/02/2007 FINDINGS: CT HEAD FINDINGS Brain: There is  no mass, hemorrhage or extra-axial collection. There is generalized atrophy without lobar predilection. There is no acute or chronic infarction. The brain parenchyma is normal. Vascular: No abnormal hyperdensity of the major intracranial arteries or dural venous sinuses. No intracranial atherosclerosis. Skull: The visualized skull base, calvarium and extracranial soft tissues are normal. Sinuses/Orbits: No fluid levels or advanced mucosal thickening of the visualized paranasal sinuses. The orbits are normal. CT TEMPORAL BONES FINDINGS RIGHT: --Pinna and external auditory canal: Normal. --Ossicular chain: Normal. No erosion or dislocation. --Tympanic membrane: Normal. --Middle ear: Small amounts of intermediate density material within the middle ear adjacent to the tympanic membrane. --Epitympanum: Minimal opacification within the epitympanum. The scutum is sharp. Tegmen tympani is intact. --Cochlea, vestibule, vestibular aqueduct and semicircular canals: Normal. No evidence of canal dehiscence or otospongiosis. --Internal auditory canal: Normal. No widening of the porus acusticus. --Facial nerve: No focal abnormality along the course of the facial nerve. --Cerebellopontine angle: Normal. --Petrous Apex: Normal. --Mastoids: Partial opacification of the mastoid air cells. No coalescence. --Carotid canal: Normal position. LEFT: --Pinna and external auditory canal: Normal. --Ossicular chain: Normal. No erosion or dislocation. --Tympanic membrane: Normal. --Middle ear: Normal. --Epitympanum: The Prussak space is clear. The scutum is sharp. Tegmen tympani is intact. --Cochlea, vestibule, vestibular aqueduct and semicircular canals: Normal. No evidence of canal dehiscence or otospongiosis. --Internal auditory canal: Normal. No widening of the porus acusticus. --Facial nerve: No focal abnormality along the course of the facial nerve. --Cerebellopontine angle: Normal. --Petrous Apex: Normal. --Mastoids: Normal. --Carotid  canal: Normal position. OTHER: --Nasopharynx: Clear. --Temporomandibular joints: Normal. IMPRESSION: 1. Mild brain parenchymal volume loss without acute intracranial abnormality. 2. Small right mastoid effusion without coalescence. Minimal opacification of the middle ear. No acute findings. 3. Normal left temporal bone structures. Electronically Signed   By: Ulyses Jarred M.D.   On: 10/16/2017 03:45    Orson Eva, DO  [facility] Pager [phone]  If 7PM-7AM, please contact night-coverage [email or web address] Password [email or web address] 11/08/2017, 5:43 PM   LOS: 4 days

## 2017-11-09 LAB — CBC
HCT: 40.7 % (ref 39.0–52.0)
Hemoglobin: 13 g/dL (ref 13.0–17.0)
MCH: 28 pg (ref 26.0–34.0)
MCHC: 31.9 g/dL (ref 30.0–36.0)
MCV: 87.7 fL (ref 78.0–100.0)
PLATELETS: 156 10*3/uL (ref 150–400)
RBC: 4.64 MIL/uL (ref 4.22–5.81)
RDW: 15.4 % (ref 11.5–15.5)
WBC: 10.2 10*3/uL (ref 4.0–10.5)

## 2017-11-09 LAB — GLUCOSE, CAPILLARY: GLUCOSE-CAPILLARY: 206 mg/dL — AB (ref 70–99)

## 2017-11-09 LAB — HEPARIN LEVEL (UNFRACTIONATED): HEPARIN UNFRACTIONATED: 0.3 [IU]/mL (ref 0.30–0.70)

## 2017-11-09 LAB — PROTIME-INR
INR: 1.31
PROTHROMBIN TIME: 16.1 s — AB (ref 11.4–15.2)

## 2017-11-09 MED ORDER — DOXYCYCLINE HYCLATE 100 MG PO TABS: 100.0000 mg | ORAL_TABLET | Freq: Two times a day (BID) | ORAL | 0 refills | Status: DC

## 2017-11-09 MED ORDER — CEFDINIR 300 MG PO CAPS: 300.0000 mg | ORAL_CAPSULE | Freq: Two times a day (BID) | ORAL | 0 refills | Status: DC

## 2017-11-09 MED ORDER — INSULIN DETEMIR 100 UNIT/ML FLEXPEN: 60.0000 [IU] | PEN_INJECTOR | Freq: Every morning | SUBCUTANEOUS | 0 refills | Status: DC

## 2017-11-09 NOTE — Progress Notes (Signed)
Inpatient Diabetes Program Recommendations  AACE/ADA: New Consensus Statement on Inpatient Glycemic Control (2015)  Target Ranges:  Prepandial:   less than 140 mg/dL      Peak postprandial:   less than 180 mg/dL (1-2 hours)      Critically ill patients:  140 - 180 mg/dL   Lab Results  Component Value Date   GLUCAP 206 (H) 11/09/2017   HGBA1C 10.2 (H) 11/07/2017    Review of Glycemic Control  Inpatient Diabetes Program Recommendations:   Spoke with patient's wife about A1C 10.2 (average blood glucose 246 over the past 2-3 months) results with them and explained what an A1C is, basic pathophysiology of DM Type 2, basic home care, basic diabetes diet nutrition principles, importance of checking CBGs and maintaining good CBG control to prevent long-term and short-term complications. Reviewed signs and symptoms of hyperglycemia and hypoglycemia and how to treat hypoglycemia at home. Also reviewed blood sugar goals at home.  Wife shared patient was on steroids and has had elevated CBGS during this time. Patient will no longer be on steroids and wife feels has no other needs @ this time.  Thank you, Nani Gasser. Dulcemaria Bula, RN, MSN, CDE  Diabetes Coordinator Inpatient Glycemic Control Team Team Pager [phone] (8am-5pm) 11/09/2017 10:24 AM

## 2017-11-09 NOTE — Progress Notes (Signed)
Chart reviewed by Dr Gifford Shave, [place] for [facility] provided pt is stable for D/C today from [facility] hospital.

## 2017-11-09 NOTE — Progress Notes (Signed)
ANTICOAGULATION CONSULT NOTE - Initial Consult  Pharmacy Consult for heparin (warfarin on hold) Indication: atrial fibrillation  Allergies  Allergen Reactions  . Erythromycin Other (See Comments)  . Penicillins Hives and Other (See Comments)    Has patient had a PCN reaction causing immediate rash, facial/tongue/throat swelling, SOB or lightheadedness with hypotension: No Has patient had a PCN reaction causing severe rash involving mucus membranes or skin necrosis: No Has patient had a PCN reaction that required hospitalization No Has patient had a PCN reaction occurring within the last 10 years: No If all of the above answers are "NO", then may proceed with Cephalosporin use.  Has tolerated Rocephin    Patient Measurements: Height: 6' (182.9 cm) Weight: 272 lb 7.8 oz (123.6 kg) IBW/kg (Calculated) : 77.6 Heparin Dosing Weight: 104 kg  Vital Signs: Temp: 98.4 F (36.9 C) (08/06 0622) Temp Source: Oral (08/06 0622) BP: 118/44 (08/06 0622) Pulse Rate: 72 (08/06 0622)  Labs: Recent Labs    11/07/17 0735 11/08/17 0553 11/09/17 0525  HGB 13.5 13.6 13.0  HCT 41.3 42.2 40.7  PLT 148* 158 156  LABPROT  --   --  16.1*  INR  --   --  1.31  HEPARINUNFRC 0.42 0.44 0.30  CREATININE 0.84 0.77  --     Estimated Creatinine Clearance: 98.3 mL/min (by C-G formula based on SCr of 0.77 mg/dL).   Medical History: Past Medical History:  Diagnosis Date  . Atrial fibrillation ([place])   . Chronic lymphocytic leukemia ([place])   . Coronary atherosclerosis of native coronary artery    Nonobstructive  . Essential hypertension, benign   . Hyperlipidemia   . IgA nephropathy    Dr. Lowanda Foster  . Type 2 diabetes mellitus ([place])   . UTI (urinary tract infection) july  10 th   taking  med     Medications:  Medications Prior to Admission  Medication Sig Dispense Refill Last Dose  . Ascorbic Acid (VITAMIN C) 1000 MG tablet Take 1,000 mg by mouth daily with breakfast.    11/08/2017 at Unknown  time  . Calcium Carb-Cholecalciferol (CALCIUM 600+D) 600-800 MG-UNIT TABS Take 2 tablets by mouth daily with breakfast.   11/08/2017 at Unknown time  . cholecalciferol (VITAMIN D) 1000 units tablet Take 1,000 Units by mouth every morning.   11/08/2017 at Unknown time  . Coenzyme Q10 (CO Q-10) 100 MG CAPS Take 100 mg by mouth daily with breakfast.    11/08/2017 at Unknown time  . digoxin (LANOXIN) 0.125 MG tablet Take 0.125 mg by mouth daily with breakfast.    11/08/2017 at Unknown time  . finasteride (PROSCAR) 5 MG tablet Take 5 mg by mouth daily.   11/08/2017 at Unknown time  . furosemide (LASIX) 20 MG tablet Take 2 pills a day (Patient taking differently: Take 20-40 mg by mouth daily as needed. Take 2 pills a day) 20 tablet 0 11/08/2017 at Unknown time  . glimepiride (AMARYL) 2 MG tablet Take 2 mg by mouth daily with breakfast.    11/08/2017 at Unknown time  . lisinopril (PRINIVIL,ZESTRIL) 20 MG tablet Take 40 mg by mouth daily with supper.   11/08/2017 at Unknown time  . Multiple Vitamin (MULTIVITAMIN WITH MINERALS) TABS tablet Take 1 tablet by mouth daily with breakfast.   11/08/2017 at Unknown time  . VICTOZA 18 MG/3ML SOPN Inject 1.8 mg into the skin daily with breakfast.   11/08/2017 at Unknown time  . [DISCONTINUED] Insulin Detemir (LEVEMIR FLEXPEN) 100 UNIT/ML Pen Inject 20 Units  into the skin every morning.   11/08/2017 at Unknown time  . atorvastatin (LIPITOR) 10 MG tablet Take 10 mg by mouth every evening.   11/07/2017 at Unknown time  . insulin aspart (NOVOLOG FLEXPEN) 100 UNIT/ML FlexPen Inject 5-18 Units into the skin 3 (three) times daily with meals. Pt uses per sliding scale before lunch and dinner.   11/08/2017 at Unknown time  . LORazepam (ATIVAN) 1 MG tablet Take 1 tablet by mouth at bedtime.    11/07/2017 at Unknown time  . pioglitazone (ACTOS) 15 MG tablet Take 15 mg by mouth every evening.    11/07/2017 at Unknown time  . warfarin (COUMADIN) 7.5 MG tablet Take 7.5 mg by mouth daily.   Past Week at Unknown  time    Assessment: Pharmacy consulted to dose transition to heparin IV from warfarin for patient getting surgery next week. . Heparin level is therapeutic at 0.30.  Goal of Therapy:  Heparin level 0.3-0.7 units/ml Monitor platelets by anticoagulation protocol: Yes   Plan:  Continue heparin infusion at 1500 units/hr Check anti-Xa level daily while on heparin Continue to monitor H&H and platelets    Revonda Standard Jayion Schneck 11/09/2017,10:47 AM

## 2017-11-09 NOTE — Progress Notes (Signed)
Discharge instructions gone over with patient and spouse, verbalized understanding. IV's removed, patient tolerated procedure well.

## 2017-11-10 ENCOUNTER — Encounter (HOSPITAL_BASED_OUTPATIENT_CLINIC_OR_DEPARTMENT_OTHER)
Admission: RE | Disposition: A | Discharge status: Home / Self Care | Payer: Self-pay | Source: Ambulatory Visit | Attending: Otolaryngology

## 2017-11-10 ENCOUNTER — Ambulatory Visit (HOSPITAL_BASED_OUTPATIENT_CLINIC_OR_DEPARTMENT_OTHER): Payer: Medicare Other | Admitting: Anesthesiology

## 2017-11-10 ENCOUNTER — Other Ambulatory Visit: Payer: Self-pay

## 2017-11-10 ENCOUNTER — Encounter (HOSPITAL_BASED_OUTPATIENT_CLINIC_OR_DEPARTMENT_OTHER): Payer: Self-pay | Admitting: Anesthesiology

## 2017-11-10 ENCOUNTER — Ambulatory Visit (HOSPITAL_BASED_OUTPATIENT_CLINIC_OR_DEPARTMENT_OTHER)
Admission: RE | Admit: 2017-11-10 | Discharge: 2017-11-10 | Disposition: A | Discharge status: Home / Self Care | Payer: Medicare Other | Source: Ambulatory Visit | Attending: Otolaryngology | Admitting: Otolaryngology

## 2017-11-10 DIAGNOSIS — I5022 Chronic systolic (congestive) heart failure: Secondary | ICD-10-CM | POA: Diagnosis not present

## 2017-11-10 DIAGNOSIS — I110 Hypertensive heart disease with heart failure: Secondary | ICD-10-CM | POA: Diagnosis not present

## 2017-11-10 DIAGNOSIS — E1165 Type 2 diabetes mellitus with hyperglycemia: Secondary | ICD-10-CM | POA: Insufficient documentation

## 2017-11-10 DIAGNOSIS — Z7901 Long term (current) use of anticoagulants: Secondary | ICD-10-CM | POA: Insufficient documentation

## 2017-11-10 DIAGNOSIS — H6021 Malignant otitis externa, right ear: Secondary | ICD-10-CM | POA: Insufficient documentation

## 2017-11-10 DIAGNOSIS — Z79899 Other long term (current) drug therapy: Secondary | ICD-10-CM | POA: Diagnosis not present

## 2017-11-10 DIAGNOSIS — Z794 Long term (current) use of insulin: Secondary | ICD-10-CM | POA: Insufficient documentation

## 2017-11-10 DIAGNOSIS — Z880 Allergy status to penicillin: Secondary | ICD-10-CM | POA: Insufficient documentation

## 2017-11-10 DIAGNOSIS — H6091 Unspecified otitis externa, right ear: Secondary | ICD-10-CM | POA: Diagnosis not present

## 2017-11-10 DIAGNOSIS — I482 Chronic atrial fibrillation: Secondary | ICD-10-CM | POA: Insufficient documentation

## 2017-11-10 DIAGNOSIS — Z87891 Personal history of nicotine dependence: Secondary | ICD-10-CM | POA: Diagnosis not present

## 2017-11-10 DIAGNOSIS — B0221 Postherpetic geniculate ganglionitis: Secondary | ICD-10-CM | POA: Diagnosis not present

## 2017-11-10 DIAGNOSIS — I2510 Atherosclerotic heart disease of native coronary artery without angina pectoris: Secondary | ICD-10-CM | POA: Insufficient documentation

## 2017-11-10 DIAGNOSIS — G510 Bell's palsy: Secondary | ICD-10-CM | POA: Diagnosis not present

## 2017-11-10 DIAGNOSIS — H7011 Chronic mastoiditis, right ear: Secondary | ICD-10-CM | POA: Diagnosis not present

## 2017-11-10 DIAGNOSIS — H70091 Acute mastoiditis with other complications, right ear: Secondary | ICD-10-CM | POA: Diagnosis not present

## 2017-11-10 DIAGNOSIS — G519 Disorder of facial nerve, unspecified: Secondary | ICD-10-CM | POA: Diagnosis not present

## 2017-11-10 DIAGNOSIS — H7091 Unspecified mastoiditis, right ear: Secondary | ICD-10-CM | POA: Diagnosis not present

## 2017-11-10 HISTORY — PX: TYMPANOMASTOIDECTOMY: SHX34

## 2017-11-10 LAB — GLUCOSE, CAPILLARY
GLUCOSE-CAPILLARY: 267 mg/dL — AB (ref 70–99)
Glucose-Capillary: 248 mg/dL — ABNORMAL HIGH (ref 70–99)

## 2017-11-10 SURGERY — TYMPANOPLASTY, WITH MASTOIDECTOMY
Anesthesia: General | Site: Ear | Laterality: Right

## 2017-11-10 MED ORDER — CIPROFLOXACIN-DEXAMETHASONE 0.3-0.1 % OT SUSP
OTIC | Status: AC
  Filled 2017-11-10: qty 7.5

## 2017-11-10 MED ORDER — HYDROMORPHONE HCL 1 MG/ML IJ SOLN
INTRAMUSCULAR | Status: AC
  Filled 2017-11-10: qty 0.5

## 2017-11-10 MED ORDER — FENTANYL CITRATE (PF) 100 MCG/2ML IJ SOLN
50.0000 ug | INTRAMUSCULAR | Status: AC | PRN
  Administered 2017-11-10: 50 ug via INTRAVENOUS
  Administered 2017-11-10: 25 ug via INTRAVENOUS
  Administered 2017-11-10 (×4): 50 ug via INTRAVENOUS
  Administered 2017-11-10: 25 ug via INTRAVENOUS
  Administered 2017-11-10 (×2): 50 ug via INTRAVENOUS

## 2017-11-10 MED ORDER — ONDANSETRON HCL 4 MG/2ML IJ SOLN
INTRAMUSCULAR | Status: AC
  Filled 2017-11-10: qty 2

## 2017-11-10 MED ORDER — OXYCODONE HCL 5 MG PO TABS: 5.0000 mg | ORAL_TABLET | Freq: Once | ORAL | Status: DC | PRN

## 2017-11-10 MED ORDER — LIDOCAINE-EPINEPHRINE 1 %-1:100000 IJ SOLN
INTRAMUSCULAR | Status: AC
  Filled 2017-11-10: qty 1

## 2017-11-10 MED ORDER — LIDOCAINE-EPINEPHRINE 1 %-1:100000 IJ SOLN
INTRAMUSCULAR | Status: DC | PRN
  Administered 2017-11-10: 4 mL

## 2017-11-10 MED ORDER — LACTATED RINGERS IV SOLN
INTRAVENOUS | Status: DC
  Administered 2017-11-10 (×2): via INTRAVENOUS

## 2017-11-10 MED ORDER — FENTANYL CITRATE (PF) 100 MCG/2ML IJ SOLN
INTRAMUSCULAR | Status: AC
  Filled 2017-11-10: qty 2

## 2017-11-10 MED ORDER — SODIUM CHLORIDE 0.9 % IV SOLN
INTRAVENOUS | Status: AC
  Filled 2017-11-10: qty 500000

## 2017-11-10 MED ORDER — DEXAMETHASONE SODIUM PHOSPHATE 10 MG/ML IJ SOLN
INTRAMUSCULAR | Status: AC
  Filled 2017-11-10: qty 2

## 2017-11-10 MED ORDER — PROMETHAZINE HCL 25 MG/ML IJ SOLN: 6.2500 mg | INTRAMUSCULAR | Status: DC | PRN

## 2017-11-10 MED ORDER — SCOPOLAMINE 1 MG/3DAYS TD PT72: 1.0000 | MEDICATED_PATCH | Freq: Once | TRANSDERMAL | Status: DC | PRN

## 2017-11-10 MED ORDER — PROPOFOL 10 MG/ML IV BOLUS
INTRAVENOUS | Status: DC | PRN
  Administered 2017-11-10: 150 mg via INTRAVENOUS

## 2017-11-10 MED ORDER — HYDROMORPHONE HCL 1 MG/ML IJ SOLN
0.2500 mg | INTRAMUSCULAR | Status: DC | PRN
  Administered 2017-11-10 (×2): 0.5 mg via INTRAVENOUS

## 2017-11-10 MED ORDER — CIPROFLOXACIN-FLUOCINOLONE PF 0.3-0.025 % OT SOLN
OTIC | Status: AC
  Filled 2017-11-10: qty 1.25

## 2017-11-10 MED ORDER — CIPROFLOXACIN-DEXAMETHASONE 0.3-0.1 % OT SUSP
OTIC | Status: DC | PRN
  Administered 2017-11-10: 4 [drp] via OTIC

## 2017-11-10 MED ORDER — LIDOCAINE 2% (20 MG/ML) 5 ML SYRINGE
INTRAMUSCULAR | Status: AC
  Filled 2017-11-10: qty 5

## 2017-11-10 MED ORDER — CIPROFLOXACIN-FLUOCINOLONE PF 0.3-0.025 % OT SOLN
OTIC | Status: DC | PRN
  Administered 2017-11-10: 1.25 mL

## 2017-11-10 MED ORDER — CLINDAMYCIN PHOSPHATE 900 MG/50ML IV SOLN
INTRAVENOUS | Status: DC | PRN
  Administered 2017-11-10: 900 mg via INTRAVENOUS

## 2017-11-10 MED ORDER — SODIUM CHLORIDE 0.9 % IV SOLN
INTRAVENOUS | Status: DC | PRN
  Administered 2017-11-10: 50 ug/min via INTRAVENOUS

## 2017-11-10 MED ORDER — LIDOCAINE 2% (20 MG/ML) 5 ML SYRINGE
INTRAMUSCULAR | Status: DC | PRN
  Administered 2017-11-10: 100 mg via INTRAVENOUS

## 2017-11-10 MED ORDER — OXYCODONE HCL 5 MG/5ML PO SOLN: 5.0000 mg | Freq: Once | ORAL | Status: DC | PRN

## 2017-11-10 MED ORDER — MIDAZOLAM HCL 2 MG/2ML IJ SOLN: 1.0000 mg | INTRAMUSCULAR | Status: DC | PRN

## 2017-11-10 MED ORDER — BACITRACIN ZINC 500 UNIT/GM EX OINT
TOPICAL_OINTMENT | CUTANEOUS | Status: AC
  Filled 2017-11-10: qty 28.35

## 2017-11-10 MED ORDER — ONDANSETRON HCL 4 MG/2ML IJ SOLN
INTRAMUSCULAR | Status: DC | PRN
  Administered 2017-11-10: 4 mg via INTRAVENOUS

## 2017-11-10 MED ORDER — EPINEPHRINE 30 MG/30ML IJ SOLN
INTRAMUSCULAR | Status: AC
  Filled 2017-11-10: qty 1

## 2017-11-10 MED ORDER — DEXAMETHASONE SODIUM PHOSPHATE 4 MG/ML IJ SOLN
INTRAMUSCULAR | Status: DC | PRN
  Administered 2017-11-10: 4 mg via INTRAVENOUS

## 2017-11-10 MED ORDER — OXYMETAZOLINE HCL 0.05 % NA SOLN
NASAL | Status: AC
  Filled 2017-11-10: qty 15

## 2017-11-10 MED ORDER — CLINDAMYCIN PHOSPHATE 900 MG/50ML IV SOLN
INTRAVENOUS | Status: AC
  Filled 2017-11-10: qty 50

## 2017-11-10 MED ORDER — HYDROMORPHONE HCL 1 MG/ML IJ SOLN
INTRAMUSCULAR | Status: AC
Start: 2017-11-10 — End: ?
  Filled 2017-11-10: qty 0.5

## 2017-11-10 MED ORDER — OXYCODONE-ACETAMINOPHEN 7.5-325 MG PO TABS
1.0000 | ORAL_TABLET | ORAL | 0 refills | Status: DC | PRN
Start: 2017-11-10 — End: 2017-12-13

## 2017-11-10 MED ORDER — PROPOFOL 10 MG/ML IV BOLUS
INTRAVENOUS | Status: AC
  Filled 2017-11-10: qty 20

## 2017-11-10 SURGICAL SUPPLY — 65 items
ADH SKN CLS APL DERMABOND .7 (GAUZE/BANDAGES/DRESSINGS) ×1
BALL CTTN LRG ABS STRL LF (GAUZE/BANDAGES/DRESSINGS) ×1
BLADE CLIPPER SURG (BLADE) IMPLANT
BLADE NDL 3 SS STRL (BLADE) IMPLANT
BLADE NEEDLE 3 SS STRL (BLADE) IMPLANT
BLADE SURG 15 STRL LF DISP TIS (BLADE) IMPLANT
BLADE SURG 15 STRL SS (BLADE) ×4
BUR SURG RND 4.0 COARSE DIAM (BURR) ×1 IMPLANT
CANISTER SUCT 1200ML W/VALVE (MISCELLANEOUS) ×3 IMPLANT
CORD BIPOLAR FORCEPS 12FT (ELECTRODE) IMPLANT
COTTONBALL LRG STERILE PKG (GAUZE/BANDAGES/DRESSINGS) ×2 IMPLANT
DECANTER SPIKE VIAL GLASS SM (MISCELLANEOUS) ×2 IMPLANT
DERMABOND ADVANCED (GAUZE/BANDAGES/DRESSINGS) ×1
DERMABOND ADVANCED .7 DNX12 (GAUZE/BANDAGES/DRESSINGS) ×1 IMPLANT
DRAPE MICROSCOPE WILD 40.5X102 (DRAPES) ×2 IMPLANT
DRAPE SURG 17X23 STRL (DRAPES) ×1 IMPLANT
DRAPE SURG IRRIG POUCH 19X23 (DRAPES) ×2 IMPLANT
DRSG GLASSCOCK MASTOID ADT (GAUZE/BANDAGES/DRESSINGS) ×1 IMPLANT
DRSG GLASSCOCK MASTOID PED (GAUZE/BANDAGES/DRESSINGS) IMPLANT
ELECT COATED BLADE 2.86 ST (ELECTRODE) ×2 IMPLANT
ELECT PAIRED SUBDERMAL (MISCELLANEOUS) ×2
ELECT REM PT RETURN 9FT ADLT (ELECTROSURGICAL) ×2
ELECTRODE PAIRED SUBDERMAL (MISCELLANEOUS) ×1 IMPLANT
ELECTRODE REM PT RTRN 9FT ADLT (ELECTROSURGICAL) ×1 IMPLANT
GAUZE SPONGE 4X4 12PLY STRL (GAUZE/BANDAGES/DRESSINGS) ×1 IMPLANT
GAUZE SPONGE 4X4 12PLY STRL LF (GAUZE/BANDAGES/DRESSINGS) IMPLANT
GLOVE BIO SURGEON STRL SZ7.5 (GLOVE) ×2 IMPLANT
GLOVE BIOGEL PI IND STRL 7.0 (GLOVE) IMPLANT
GLOVE BIOGEL PI INDICATOR 7.0 (GLOVE) ×1
GLOVE SURG SS PI 7.0 STRL IVOR (GLOVE) ×1 IMPLANT
GOWN STRL REUS W/ TWL LRG LVL3 (GOWN DISPOSABLE) ×2 IMPLANT
GOWN STRL REUS W/TWL LRG LVL3 (GOWN DISPOSABLE) ×4
HEMOSTAT SURGICEL .5X2 ABSORB (HEMOSTASIS) IMPLANT
HEMOSTAT SURGICEL 2X14 (HEMOSTASIS) IMPLANT
IV CATH AUTO 14GX1.75 SAFE ORG (IV SOLUTION) ×2 IMPLANT
IV NS 500ML (IV SOLUTION) ×2
IV NS 500ML BAXH (IV SOLUTION) ×1 IMPLANT
IV SET EXT 30 76VOL 4 MALE LL (IV SETS) ×2 IMPLANT
NDL FILTER BLUNT 18X1 1/2 (NEEDLE) ×1 IMPLANT
NDL HYPO 25X1 1.5 SAFETY (NEEDLE) ×1 IMPLANT
NDL SAFETY ECLIPSE 18X1.5 (NEEDLE) ×1 IMPLANT
NEEDLE FILTER BLUNT 18X 1/2SAF (NEEDLE) ×1
NEEDLE FILTER BLUNT 18X1 1/2 (NEEDLE) ×1 IMPLANT
NEEDLE HYPO 18GX1.5 SHARP (NEEDLE) ×2
NEEDLE HYPO 25X1 1.5 SAFETY (NEEDLE) ×2 IMPLANT
NS IRRIG 1000ML POUR BTL (IV SOLUTION) ×2 IMPLANT
PACK AMBRUS EAR (MISCELLANEOUS) ×1 IMPLANT
PACK BASIN DAY SURGERY FS (CUSTOM PROCEDURE TRAY) ×2 IMPLANT
PACK ENT DAY SURGERY (CUSTOM PROCEDURE TRAY) ×2 IMPLANT
PENCIL BUTTON HOLSTER BLD 10FT (ELECTRODE) ×2 IMPLANT
PROBE NERVBE PRASS .33 (MISCELLANEOUS) IMPLANT
SLEEVE SCD COMPRESS KNEE MED (MISCELLANEOUS) ×1 IMPLANT
SPONGE SURGIFOAM ABS GEL 12-7 (HEMOSTASIS) ×2 IMPLANT
SUT BONE WAX W31G (SUTURE) IMPLANT
SUT VIC AB 3-0 SH 27 (SUTURE) ×2
SUT VIC AB 3-0 SH 27X BRD (SUTURE) IMPLANT
SUT VIC AB 4-0 P-3 18XBRD (SUTURE) IMPLANT
SUT VIC AB 4-0 P3 18 (SUTURE)
SUT VICRYL 4-0 PS2 18IN ABS (SUTURE) ×1 IMPLANT
SYR 5ML LL (SYRINGE) ×2 IMPLANT
SYR BULB 3OZ (MISCELLANEOUS) ×2 IMPLANT
TOWEL GREEN STERILE FF (TOWEL DISPOSABLE) ×2 IMPLANT
TOWEL OR NON WOVEN STRL DISP B (DISPOSABLE) ×2 IMPLANT
TRAY DSU PREP LF (CUSTOM PROCEDURE TRAY) ×2 IMPLANT
TUBING IRRIGATION (MISCELLANEOUS) ×2 IMPLANT

## 2017-11-10 NOTE — Anesthesia Preprocedure Evaluation (Signed)
Anesthesia Evaluation  Patient identified by MRN, date of birth, ID band Patient awake    Reviewed: Allergy & Precautions, NPO status , Patient's Chart, lab work & pertinent test results  Airway Mallampati: II  TM Distance: >3 FB Neck ROM: Full    Dental  (+) Partial Upper   Pulmonary former smoker,    breath sounds clear to auscultation       Cardiovascular hypertension, Pt. on medications + CAD  + dysrhythmias Atrial Fibrillation + Valvular Problems/Murmurs  Rhythm:Regular Rate:Normal     Neuro/Psych negative neurological ROS  negative psych ROS   GI/Hepatic negative GI ROS, Neg liver ROS,   Endo/Other  diabetes, Type 2, Insulin Dependent, Oral Hypoglycemic Agents  Renal/GU Renal disease     Musculoskeletal negative musculoskeletal ROS (+)   Abdominal   Peds negative pediatric ROS (+)  Hematology negative hematology ROS (+)   Anesthesia Other Findings   Reproductive/Obstetrics                             Anesthesia Physical  Anesthesia Plan  ASA: III  Anesthesia Plan: General   Post-op Pain Management:    Induction: Intravenous  PONV Risk Score and Plan: 3 and Ondansetron, Dexamethasone and Treatment may vary due to age or medical condition  Airway Management Planned: LMA  Additional Equipment:   Intra-op Plan:   Post-operative Plan: Extubation in OR  Informed Consent: I have reviewed the patients History and Physical, chart, labs and discussed the procedure including the risks, benefits and alternatives for the proposed anesthesia with the patient or authorized representative who has indicated his/her understanding and acceptance.   Dental advisory given  Plan Discussed with: CRNA  Anesthesia Plan Comments:         Anesthesia Quick Evaluation

## 2017-11-10 NOTE — Transfer of Care (Signed)
Immediate Anesthesia Transfer of Care Note  Patient: [name] [name]  Procedure(s) Performed: RIGHT TYMPANOMASTOIDECTOMY (Right Ear)  Patient Location: PACU  Anesthesia Type:General  Level of Consciousness: awake, alert  and drowsy  Airway & Oxygen Therapy: Patient Spontanous Breathing and Patient connected to face mask oxygen  Post-op Assessment: Report given to RN and Post -op Vital signs reviewed and stable  Post vital signs: Reviewed and stable  Last Vitals:  Vitals Value Taken Time  BP 129/68 11/10/2017 12:00 PM  Temp    Pulse 82 11/10/2017 12:02 PM  Resp 18 11/10/2017 12:02 PM  SpO2 99 % 11/10/2017 12:02 PM  Vitals shown include unvalidated device data.  Last Pain:  Vitals:   11/10/17 0846  TempSrc: Oral  PainSc: 0-No pain         Complications: No apparent anesthesia complications

## 2017-11-10 NOTE — H&P (Signed)
Cc: Chronic right ear infection, right facial nerve paralysis  HPI: The patient is an 83 year old male who returns today for his follow-up evaluation.  The patient has a history of chronic right ear infection, with recent onset of right facial paralysis.  Over the past month, the patient has been experiencing frequent recurrent severe right ear pain.  He was evaluated at the [facility]. on multiple occasions.  The patient was treated with at least 3 courses of antibiotics.  He was also placed on Valtrex and prednisone.  Despite his treatment, he continues to have recurrent right ear pain. His right facial paralysis has not improved.  He recently underwent an MRI scan. The MRI showed subcentimeter fluid collections within the right mastoid air cells, concerning for abscess.  There was also enhancement of the mastoid tip.  The findings are concerning for malignant otitis externa.  It should be noted the patient has poorly controlled diabetes.  His blood sugars have been fluctuating anywhere from 100 to 500 over the past 2 weeks.  No other ENT, GI, or respiratory issue noted since the last visit.   Exam: General: Communicates without difficulty, well nourished, no acute distress. Head: Normocephalic, no evidence injury, no tenderness, facial buttresses intact without stepoff. Eyes: PERRL, EOMI. No scleral icterus, conjunctivae clear. Right facial paralysis is noted. No nystagmus at any point of gaze. Ears: Auricles well formed without lesions. Right ear canal is edematous and tender to touch.  The left ear is normal. The mastoid is nontender. Nose: External evaluation reveals normal support and skin without lesions. Dorsum is intact. Anterior rhinoscopy reveals mildly congested mucosa over anterior aspect of inferior turbinates and intact septum. No purulence noted. Oral:  Oral cavity and oropharynx are intact, symmetric, without erythema or edema. Mucosa is moist without lesions. Neck: Full range of motion  without pain. There is no significant lymphadenopathy. No masses palpable. Thyroid bed within normal limits to palpation. Parotid glands and submandibular glands equal bilaterally without mass. Trachea is midline. Gait normal. Vestibular: No nystagmus at any point of gaze. CN 2-12 are otherwise normal. The cerebellar examination is unremarkable.   Assessment 1.  The patient's history and physical exam findings are concerning for malignant right otitis externa, with small abscess pockets within the right mastoid cavities.  2.  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  3.  The patient has not responded to multiple oral antibiotics and steroids.    Plan  1.  The physical exam findings and the MRI results are reviewed with the patient and his wife.  2.  The likely diagnosis is extensively discussed.  3.  The patient should continue the use of eye lubricants and right eye protection.  4.  I would like to have the patient admitted for IV antibiotic treatment.   5.  Planned right tympanomastoidectomy surgery.

## 2017-11-10 NOTE — Discharge Instructions (Addendum)
POSTOPERATIVE INSTRUCTIONS FOR PATIENTS HAVING MASTOIDECTOMY SURGERY  1. You may have nausea, vomiting, or a low grade fever for a few days after surgery. This is not unusual.  However, if the nausea and vomiting become severe or last more than one day, please call our office. Medication for nausea may be prescribed. You may take Tylenol every four hours for fever. If your fever should rise above 101 F, please contact our office. 2. Limit your activities for one week. This includes avoiding heavy lifting (over 20lbs), vigorous exercise, and contact sports. 3. Do not blow your nose for approximately one week.  Any accumulation in the nose should be drawn back into the throat and expectorated through the mouth to avoid infecting the ear. If it is necessary to sneeze, do so with your mouth open to decrease pressure to your ears. Do not hold your nose to avoid sneezing.  4. You may wash your hair 2 days after the operation. Please protect the ear and any external incision from water. We recommend placing some plastic wrap over the ear and incision to help protect against water. It may be necessary to have someone help you during the first several washings.  5. Try to keep the incision clean and dry. You should clean crust from the incisional area with diluted hydrogen peroxide. Any time you are going to clean your ear, please wash your hands thoroughly prior to starting. 6. Some dull postoperative ear pain is expected. Your physician may prescribe pain medicine to help relieve your discomfort. If your postoperative pain increases and your medication is not helping, please call the office before taking any other medication that we have not prescribed or recommended. 7. If any of the following should occur, contact Dr.Teoh:   (Office: [phone])) a. Persistent bleeding                                                             b. Persistent fever c. Purulent drainage (pus) from the ear or  incision d. Increasing redness around the suture line e. Persistent pain or dizziness f. Facial weakness 8. Sometimes, with a larger incision behind the ear, the incision may open and drain. If it occurs, please contact our office. 9. If your physician prescribes an antibiotic, fill the prescription promptly and take all of the medicine as directed until the entire supply is gone. 10.  You may experience some popping and cracking sounds in the ear for up to several weeks. It may sound like you are talking in a barrel or a tunnel. This is normal and should not cause concern. 11. Because a nerve for taste passes through your ear, it is not unusual for your taste sensation to be altered for several weeks or months. 12. You may experience some numbness in your outer ear, earlobe, and the incision area. This is normal, and most of the numbness will be expected to fade over a period of time.  13. Your eardrum may look “pink” or “red” for up to a month postoperatively. The red coloration is due to fluid in the middle ear. The change in color should not be confused with infection.  °14. It is important to return to our office for your postoperative appointment as scheduled. If for some reason you were not given a postoperative appointment, please call our office at [phone]. ° ° ° °Post Anesthesia Home Care Instructions ° °Activity: °Get plenty of rest for the remainder of the day. A responsible individual must stay with you for 24 hours following the procedure.  °For the next 24 hours, DO NOT: °-Drive a car °-Operate machinery °-Drink alcoholic beverages °-Take any medication unless instructed by your physician °-Make any legal decisions or sign important papers. ° °Meals: °Start with liquid foods such as gelatin or soup. Progress to regular foods as tolerated. Avoid greasy, spicy, heavy foods. If nausea and/or vomiting occur, drink only clear  liquids until the nausea and/or vomiting subsides. Call your physician if vomiting continues. ° °Special Instructions/Symptoms: °Your throat may feel dry or sore from the anesthesia or the breathing tube placed in your throat during surgery. If this causes discomfort, gargle with warm salt water. The discomfort should disappear within 24 hours. ° °If you had a scopolamine patch placed behind your ear for the management of post- operative nausea and/or vomiting: ° °1. The medication in the patch is effective for 72 hours, after which it should be removed.  Wrap patch in a tissue and discard in the trash. Wash hands thoroughly with soap and water. °2. You may remove the patch earlier than 72 hours if you experience unpleasant side effects which may include dry mouth, dizziness or visual disturbances. °3. Avoid touching the patch. Wash your hands with soap and water after contact with the patch. °   ° °

## 2017-11-10 NOTE — Anesthesia Procedure Notes (Signed)
Procedure Name: LMA Insertion Date/Time: 11/10/2017 9:45 AM Performed by: Maryella Shivers, CRNA Pre-anesthesia Checklist: Patient identified, Emergency Drugs available, Suction available and Patient being monitored Patient Re-evaluated:Patient Re-evaluated prior to induction Oxygen Delivery Method: Circle system utilized Preoxygenation: Pre-oxygenation with 100% oxygen Induction Type: IV induction Ventilation: Mask ventilation without difficulty LMA: LMA inserted LMA Size: 5.0 Number of attempts: 1 Airway Equipment and Method: Bite block Placement Confirmation: positive ETCO2 Tube secured with: Tape Dental Injury: Teeth and Oropharynx as per pre-operative assessment

## 2017-11-10 NOTE — Anesthesia Postprocedure Evaluation (Signed)
Anesthesia Post Note  Patient: [name] [name]  Procedure(s) Performed: RIGHT TYMPANOMASTOIDECTOMY (Right Ear)     Patient location during evaluation: PACU Anesthesia Type: General Level of consciousness: awake and alert Pain management: pain level controlled Vital Signs Assessment: post-procedure vital signs reviewed and stable Respiratory status: spontaneous breathing, nonlabored ventilation and respiratory function stable Cardiovascular status: blood pressure returned to baseline and stable Postop Assessment: no apparent nausea or vomiting Anesthetic complications: no    Last Vitals:  Vitals:   11/10/17 1315 11/10/17 1330  BP: (!) 143/69 (!) 156/72  Pulse: 78 80  Resp: 15 19  Temp:    SpO2: 93% 96%    Last Pain:  Vitals:   11/10/17 1330  TempSrc:   PainSc: [place]

## 2017-11-10 NOTE — Op Note (Signed)
DATE OF PROCEDURE: 11/10/2017  SURGEON: Leta Baptist, MD  OPERATIVE REPORT   PREOPERATIVE DIAGNOSIS:  1. Right ear malignant otitis externa 2. Right chronic mastoiditis with abscess 3. Right facial nerve paralysis  POSTOPERATIVE DIAGNOSIS:  1. Right ear malignant otitis externa 2. Right chronic mastoiditis with abscess 3. Right facial nerve paralysis  PROCEDURES PERFORMED: 1. Right canal-wall-down tympanomastoidectomy  ANESTHESIA: General laryngeal mask anesthesia.  COMPLICATIONS: None.  ESTIMATED BLOOD LOSS: 50 ml  INDICATION FOR PROCEDURE:   [name] [name] is a 83 y.o. male with a history of persistent right ear canal infection. It should be noted that the patient has a history of poorly controlled diabetes. He was treated with multiple courses of antibiotics. Despite her treatment, he continued to complain of severe right ear pain. He was also noted to have an infected cyst within his right ear canal. He underwent excision of the cyst. His right ear pain subsequently worsened. He then developed complete right facial paralysis. His CT and MRI scanned showed possible abscess pockets within his right mastoid cavities, with erosion of the mastoid tip. Based on the above findings, the decision was made for patient to undergo the above-stated procedure. The risks, benefits, alternatives, and details of the procedures were discussed with the patient. Questions were invited and answered. Informed consent was obtained.  DESCRIPTION OF PROCEDURE: The patient was taken to the operating room and placed supine on the operating table. General endotracheal tube anesthesia was induced by the anesthesiologist.The patient was positioned and prepped and draped in a standard fashion for right ear surgery. Facial nerve monitoring electrodes were placed. The facial nerve monitoring system was functional throughout the case.  1% lidocaine with 1-100,000 epinephrine was infiltrated into the  right postauricular crease and into all 4 quadrants of the ear canal. Under the operating microscope, the left ear canal was examined. Purulent drainage was noted. The inferior portion of the bony ear canal was noted to be eroded. A standard tympanomeatal flap was elevated in a standard fashion. Examination of the middle ear cavity revealed no evidence of infection or cholesteatoma.   A standard postauricular incision was made. The soft tissue covering the mastoid cortex was carefully elevated. Using a #6 cutting bur, a standard cortical mastoidectomy procedure was performed. The tegmen and sigmoid sinus were identified and preserved. The posterior canal wall was taken down to the level of the tympanic rim. The antrum was entered. No cholesteatoma or infection was noted.  The mastoid tip inferior to the external auditory canal was noted to be eroded by the infection. A purulent pocket was noted inferior to the mastoid tip. The area was carefully debrided and copiously irrigated with antibiotic solution.  Attention was then focused on the meatoplasty portion of the case. A 6:00 and 12:00 incision was made. The posterior concha cartilage was removed. The posterior soft tissue flap was then sutured to the posterior aspect of the mastoid cavity. A large meatal opening was achieved.  The postauricular incision was then closed in layers with 4-0 Vicryl and Dermabond. A Glasscock dressing was applied. That concluded the procedure for the patient. The care of the patient was turned over to the anesthesiologist. The patient was awakened from anesthesia without difficulty. He was extubated and transferred to the recovery room in good condition.  OPERATIVE FINDINGS:Right malignant otitis externa, right mastoid abscess, with erosion of the right mastoid tip.  SPECIMEN: Right mastoid contents  FOLLOWUP CARE: The patient will be discharged home once he is awake and alert.  He will follow up in my office  in 1 week.

## 2017-11-11 ENCOUNTER — Encounter (HOSPITAL_BASED_OUTPATIENT_CLINIC_OR_DEPARTMENT_OTHER): Payer: Self-pay | Admitting: Otolaryngology

## 2017-11-15 ENCOUNTER — Ambulatory Visit (INDEPENDENT_AMBULATORY_CARE_PROVIDER_SITE_OTHER): Payer: Medicare Other | Admitting: Otolaryngology

## 2017-11-19 DIAGNOSIS — E1142 Type 2 diabetes mellitus with diabetic polyneuropathy: Secondary | ICD-10-CM | POA: Diagnosis not present

## 2017-11-19 DIAGNOSIS — L97521 Non-pressure chronic ulcer of other part of left foot limited to breakdown of skin: Secondary | ICD-10-CM | POA: Diagnosis not present

## 2017-11-23 ENCOUNTER — Ambulatory Visit (INDEPENDENT_AMBULATORY_CARE_PROVIDER_SITE_OTHER): Payer: Medicare Other | Admitting: Pharmacist

## 2017-11-23 DIAGNOSIS — Z5181 Encounter for therapeutic drug level monitoring: Secondary | ICD-10-CM

## 2017-11-23 DIAGNOSIS — Z7901 Long term (current) use of anticoagulants: Secondary | ICD-10-CM

## 2017-11-23 DIAGNOSIS — I4891 Unspecified atrial fibrillation: Secondary | ICD-10-CM

## 2017-11-23 LAB — POCT INR: INR: 2.1 (ref 2.0–3.0)

## 2017-11-23 NOTE — Patient Instructions (Signed)
Description   Continue 1 1/2 tablets daily except 1 tablet on Tuesdays and Fridays Recheck in 3 weeks

## 2017-12-01 DIAGNOSIS — N028 Recurrent and persistent hematuria with other morphologic changes: Secondary | ICD-10-CM | POA: Insufficient documentation

## 2017-12-01 DIAGNOSIS — I4891 Unspecified atrial fibrillation: Secondary | ICD-10-CM | POA: Insufficient documentation

## 2017-12-01 DIAGNOSIS — I10 Essential (primary) hypertension: Secondary | ICD-10-CM | POA: Insufficient documentation

## 2017-12-01 DIAGNOSIS — E119 Type 2 diabetes mellitus without complications: Secondary | ICD-10-CM

## 2017-12-02 DIAGNOSIS — H0231 Blepharochalasis right upper eyelid: Secondary | ICD-10-CM | POA: Diagnosis not present

## 2017-12-02 DIAGNOSIS — H16212 Exposure keratoconjunctivitis, left eye: Secondary | ICD-10-CM | POA: Diagnosis not present

## 2017-12-02 DIAGNOSIS — H0235 Blepharochalasis left lower eyelid: Secondary | ICD-10-CM | POA: Diagnosis not present

## 2017-12-02 DIAGNOSIS — H02142 Spastic ectropion of right lower eyelid: Secondary | ICD-10-CM | POA: Diagnosis not present

## 2017-12-02 DIAGNOSIS — H04123 Dry eye syndrome of bilateral lacrimal glands: Secondary | ICD-10-CM | POA: Diagnosis not present

## 2017-12-02 DIAGNOSIS — H04521 Eversion of right lacrimal punctum: Secondary | ICD-10-CM | POA: Diagnosis not present

## 2017-12-02 DIAGNOSIS — H16211 Exposure keratoconjunctivitis, right eye: Secondary | ICD-10-CM | POA: Diagnosis not present

## 2017-12-02 DIAGNOSIS — H0232 Blepharochalasis right lower eyelid: Secondary | ICD-10-CM | POA: Diagnosis not present

## 2017-12-02 DIAGNOSIS — H02112 Cicatricial ectropion of right lower eyelid: Secondary | ICD-10-CM | POA: Diagnosis not present

## 2017-12-02 DIAGNOSIS — G514 Facial myokymia: Secondary | ICD-10-CM | POA: Diagnosis not present

## 2017-12-02 DIAGNOSIS — H02132 Senile ectropion of right lower eyelid: Secondary | ICD-10-CM | POA: Diagnosis not present

## 2017-12-02 DIAGNOSIS — H0234 Blepharochalasis left upper eyelid: Secondary | ICD-10-CM | POA: Diagnosis not present

## 2017-12-03 DIAGNOSIS — L97521 Non-pressure chronic ulcer of other part of left foot limited to breakdown of skin: Secondary | ICD-10-CM | POA: Diagnosis not present

## 2017-12-03 DIAGNOSIS — E1142 Type 2 diabetes mellitus with diabetic polyneuropathy: Secondary | ICD-10-CM | POA: Diagnosis not present

## 2017-12-13 ENCOUNTER — Inpatient Hospital Stay (HOSPITAL_COMMUNITY)
Admission: EM | Admit: 2017-12-13 | Discharge: 2017-12-18 | DRG: 155 | Disposition: A | Discharge status: Skilled Nursing Facility | Payer: Medicare Other | Attending: Internal Medicine | Admitting: Internal Medicine

## 2017-12-13 ENCOUNTER — Encounter (HOSPITAL_COMMUNITY): Payer: Self-pay | Admitting: Emergency Medicine

## 2017-12-13 ENCOUNTER — Other Ambulatory Visit: Payer: Self-pay

## 2017-12-13 ENCOUNTER — Ambulatory Visit (INDEPENDENT_AMBULATORY_CARE_PROVIDER_SITE_OTHER): Payer: Medicare Other | Admitting: Otolaryngology

## 2017-12-13 DIAGNOSIS — Z880 Allergy status to penicillin: Secondary | ICD-10-CM

## 2017-12-13 DIAGNOSIS — C9110 Chronic lymphocytic leukemia of B-cell type not having achieved remission: Secondary | ICD-10-CM | POA: Diagnosis present

## 2017-12-13 DIAGNOSIS — G510 Bell's palsy: Secondary | ICD-10-CM | POA: Diagnosis not present

## 2017-12-13 DIAGNOSIS — E119 Type 2 diabetes mellitus without complications: Secondary | ICD-10-CM

## 2017-12-13 DIAGNOSIS — I2510 Atherosclerotic heart disease of native coronary artery without angina pectoris: Secondary | ICD-10-CM | POA: Diagnosis present

## 2017-12-13 DIAGNOSIS — I482 Chronic atrial fibrillation, unspecified: Secondary | ICD-10-CM | POA: Diagnosis not present

## 2017-12-13 DIAGNOSIS — H60329 Hemorrhagic otitis externa, unspecified ear: Secondary | ICD-10-CM | POA: Diagnosis present

## 2017-12-13 DIAGNOSIS — E1165 Type 2 diabetes mellitus with hyperglycemia: Secondary | ICD-10-CM | POA: Diagnosis not present

## 2017-12-13 DIAGNOSIS — R262 Difficulty in walking, not elsewhere classified: Secondary | ICD-10-CM | POA: Diagnosis not present

## 2017-12-13 DIAGNOSIS — E1169 Type 2 diabetes mellitus with other specified complication: Secondary | ICD-10-CM | POA: Diagnosis present

## 2017-12-13 DIAGNOSIS — Z881 Allergy status to other antibiotic agents status: Secondary | ICD-10-CM | POA: Diagnosis not present

## 2017-12-13 DIAGNOSIS — I10 Essential (primary) hypertension: Secondary | ICD-10-CM | POA: Diagnosis present

## 2017-12-13 DIAGNOSIS — I5032 Chronic diastolic (congestive) heart failure: Secondary | ICD-10-CM | POA: Diagnosis present

## 2017-12-13 DIAGNOSIS — Z95828 Presence of other vascular implants and grafts: Secondary | ICD-10-CM | POA: Diagnosis not present

## 2017-12-13 DIAGNOSIS — Z79899 Other long term (current) drug therapy: Secondary | ICD-10-CM | POA: Diagnosis not present

## 2017-12-13 DIAGNOSIS — Z79891 Long term (current) use of opiate analgesic: Secondary | ICD-10-CM | POA: Diagnosis not present

## 2017-12-13 DIAGNOSIS — Z452 Encounter for adjustment and management of vascular access device: Secondary | ICD-10-CM | POA: Diagnosis not present

## 2017-12-13 DIAGNOSIS — Z792 Long term (current) use of antibiotics: Secondary | ICD-10-CM | POA: Diagnosis not present

## 2017-12-13 DIAGNOSIS — E118 Type 2 diabetes mellitus with unspecified complications: Secondary | ICD-10-CM | POA: Diagnosis not present

## 2017-12-13 DIAGNOSIS — H60321 Hemorrhagic otitis externa, right ear: Secondary | ICD-10-CM | POA: Diagnosis not present

## 2017-12-13 DIAGNOSIS — M868X8 Other osteomyelitis, other site: Secondary | ICD-10-CM | POA: Diagnosis present

## 2017-12-13 DIAGNOSIS — H70001 Acute mastoiditis without complications, right ear: Secondary | ICD-10-CM | POA: Diagnosis not present

## 2017-12-13 DIAGNOSIS — M6281 Muscle weakness (generalized): Secondary | ICD-10-CM | POA: Diagnosis not present

## 2017-12-13 DIAGNOSIS — Z89422 Acquired absence of other left toe(s): Secondary | ICD-10-CM | POA: Diagnosis not present

## 2017-12-13 DIAGNOSIS — E785 Hyperlipidemia, unspecified: Secondary | ICD-10-CM | POA: Diagnosis present

## 2017-12-13 DIAGNOSIS — Z794 Long term (current) use of insulin: Secondary | ICD-10-CM | POA: Diagnosis not present

## 2017-12-13 DIAGNOSIS — Z96651 Presence of right artificial knee joint: Secondary | ICD-10-CM | POA: Diagnosis present

## 2017-12-13 DIAGNOSIS — E782 Mixed hyperlipidemia: Secondary | ICD-10-CM | POA: Diagnosis not present

## 2017-12-13 DIAGNOSIS — Z87891 Personal history of nicotine dependence: Secondary | ICD-10-CM | POA: Diagnosis not present

## 2017-12-13 DIAGNOSIS — I69822 Dysarthria following other cerebrovascular disease: Secondary | ICD-10-CM | POA: Diagnosis not present

## 2017-12-13 DIAGNOSIS — H70009 Acute mastoiditis without complications, unspecified ear: Secondary | ICD-10-CM | POA: Diagnosis present

## 2017-12-13 DIAGNOSIS — Z7901 Long term (current) use of anticoagulants: Secondary | ICD-10-CM | POA: Diagnosis not present

## 2017-12-13 DIAGNOSIS — H6020 Malignant otitis externa, unspecified ear: Secondary | ICD-10-CM | POA: Diagnosis present

## 2017-12-13 DIAGNOSIS — N289 Disorder of kidney and ureter, unspecified: Secondary | ICD-10-CM | POA: Diagnosis not present

## 2017-12-13 DIAGNOSIS — Z48810 Encounter for surgical aftercare following surgery on the sense organs: Secondary | ICD-10-CM | POA: Diagnosis not present

## 2017-12-13 DIAGNOSIS — I110 Hypertensive heart disease with heart failure: Secondary | ICD-10-CM | POA: Diagnosis present

## 2017-12-13 DIAGNOSIS — Z978 Presence of other specified devices: Secondary | ICD-10-CM | POA: Diagnosis not present

## 2017-12-13 LAB — CBC WITH DIFFERENTIAL/PLATELET
BASOS PCT: 0 %
Basophils Absolute: 0 10*3/uL (ref 0.0–0.1)
EOS PCT: 2 %
Eosinophils Absolute: 0.4 10*3/uL (ref 0.0–0.7)
HEMATOCRIT: 40.9 % (ref 39.0–52.0)
HEMOGLOBIN: 13.6 g/dL (ref 13.0–17.0)
LYMPHS PCT: 56 %
Lymphs Abs: 10.9 10*3/uL — ABNORMAL HIGH (ref 0.7–4.0)
MCH: 28.9 pg (ref 26.0–34.0)
MCHC: 33.3 g/dL (ref 30.0–36.0)
MCV: 87 fL (ref 78.0–100.0)
MONOS PCT: 4 %
Monocytes Absolute: 0.8 10*3/uL (ref 0.1–1.0)
NEUTROS PCT: 38 %
Neutro Abs: 7.4 10*3/uL (ref 1.7–7.7)
Platelets: 301 10*3/uL (ref 150–400)
RBC: 4.7 MIL/uL (ref 4.22–5.81)
RDW: 14.8 % (ref 11.5–15.5)
WBC: 19.5 10*3/uL — ABNORMAL HIGH (ref 4.0–10.5)

## 2017-12-13 LAB — BASIC METABOLIC PANEL
Anion gap: 8 (ref 5–15)
BUN: 24 mg/dL — AB (ref 8–23)
CHLORIDE: 99 mmol/L (ref 98–111)
CO2: 29 mmol/L (ref 22–32)
CREATININE: 0.89 mg/dL (ref 0.61–1.24)
Calcium: 10.6 mg/dL — ABNORMAL HIGH (ref 8.9–10.3)
GFR calc non Af Amer: >60 mL/min (ref >60)
GLUCOSE: 196 mg/dL — AB (ref 70–99)
Potassium: 4.1 mmol/L (ref 3.5–5.1)
Sodium: 136 mmol/L (ref 135–145)

## 2017-12-13 LAB — GLUCOSE, CAPILLARY: GLUCOSE-CAPILLARY: 161 mg/dL — AB (ref 70–99)

## 2017-12-13 LAB — PROTIME-INR
INR: 4.43
Prothrombin Time: 41.9 seconds — ABNORMAL HIGH (ref 11.4–15.2)

## 2017-12-13 MED ORDER — CALCIUM CARBONATE-VITAMIN D 500-200 MG-UNIT PO TABS
2.0000 | ORAL_TABLET | Freq: Every day | ORAL | Status: DC
  Administered 2017-12-14 – 2017-12-18 (×5): 2 via ORAL
  Filled 2017-12-13 (×7): qty 2

## 2017-12-13 MED ORDER — LORAZEPAM 1 MG PO TABS
1.0000 mg | ORAL_TABLET | Freq: Every evening | ORAL | Status: DC | PRN
  Administered 2017-12-13: 1 mg via ORAL
  Filled 2017-12-13: qty 1

## 2017-12-13 MED ORDER — VITAMIN C 500 MG PO TABS
1000.0000 mg | ORAL_TABLET | Freq: Every day | ORAL | Status: DC
  Administered 2017-12-14 – 2017-12-18 (×5): 1000 mg via ORAL
  Filled 2017-12-13 (×5): qty 2

## 2017-12-13 MED ORDER — ACETAMINOPHEN 325 MG PO TABS
650.0000 mg | ORAL_TABLET | Freq: Once | ORAL | Status: AC
  Administered 2017-12-13: 650 mg via ORAL
  Filled 2017-12-13: qty 2

## 2017-12-13 MED ORDER — SODIUM CHLORIDE 0.9 % IV SOLN
2.0000 g | INTRAVENOUS | Status: DC
  Administered 2017-12-14 – 2017-12-17 (×4): 2 g via INTRAVENOUS
  Filled 2017-12-13 (×2): qty 2
  Filled 2017-12-13: qty 20
  Filled 2017-12-13: qty 2
  Filled 2017-12-13: qty 20
  Filled 2017-12-13: qty 2
  Filled 2017-12-13: qty 20

## 2017-12-13 MED ORDER — ONDANSETRON HCL 4 MG PO TABS: 4.0000 mg | ORAL_TABLET | Freq: Four times a day (QID) | ORAL | Status: DC | PRN

## 2017-12-13 MED ORDER — WARFARIN SODIUM 5 MG PO TABS: 5.0000 mg | ORAL_TABLET | ORAL | Status: DC

## 2017-12-13 MED ORDER — DIGOXIN 125 MCG PO TABS
0.1250 mg | ORAL_TABLET | Freq: Every day | ORAL | Status: DC
  Administered 2017-12-14 – 2017-12-18 (×5): 0.125 mg via ORAL
  Filled 2017-12-13 (×5): qty 1

## 2017-12-13 MED ORDER — ACETAMINOPHEN 325 MG PO TABS
650.0000 mg | ORAL_TABLET | Freq: Four times a day (QID) | ORAL | Status: DC | PRN
  Administered 2017-12-14 – 2017-12-18 (×11): 650 mg via ORAL
  Filled 2017-12-13 (×12): qty 2

## 2017-12-13 MED ORDER — ONDANSETRON HCL 4 MG/2ML IJ SOLN: 4.0000 mg | Freq: Four times a day (QID) | INTRAMUSCULAR | Status: DC | PRN

## 2017-12-13 MED ORDER — POLYETHYLENE GLYCOL 3350 17 G PO PACK: 17.0000 g | PACK | Freq: Every day | ORAL | Status: DC | PRN

## 2017-12-13 MED ORDER — SODIUM CHLORIDE 0.9% FLUSH
3.0000 mL | Freq: Two times a day (BID) | INTRAVENOUS | Status: DC
  Administered 2017-12-13 – 2017-12-17 (×6): 3 mL via INTRAVENOUS

## 2017-12-13 MED ORDER — FINASTERIDE 5 MG PO TABS
5.0000 mg | ORAL_TABLET | Freq: Every morning | ORAL | Status: DC
  Administered 2017-12-14 – 2017-12-18 (×5): 5 mg via ORAL
  Filled 2017-12-13 (×7): qty 1

## 2017-12-13 MED ORDER — TRAMADOL HCL 50 MG PO TABS
50.0000 mg | ORAL_TABLET | Freq: Four times a day (QID) | ORAL | Status: DC | PRN
  Administered 2017-12-14 – 2017-12-15 (×2): 50 mg via ORAL
  Filled 2017-12-13 (×2): qty 1

## 2017-12-13 MED ORDER — ADULT MULTIVITAMIN W/MINERALS CH
1.0000 | ORAL_TABLET | Freq: Every day | ORAL | Status: DC
  Administered 2017-12-14 – 2017-12-18 (×5): 1 via ORAL
  Filled 2017-12-13 (×5): qty 1

## 2017-12-13 MED ORDER — METRONIDAZOLE IN NACL 5-0.79 MG/ML-% IV SOLN
500.0000 mg | Freq: Three times a day (TID) | INTRAVENOUS | Status: DC
  Administered 2017-12-14 – 2017-12-15 (×6): 500 mg via INTRAVENOUS
  Filled 2017-12-13 (×6): qty 100

## 2017-12-13 MED ORDER — LISINOPRIL 10 MG PO TABS
40.0000 mg | ORAL_TABLET | Freq: Every day | ORAL | Status: DC
  Administered 2017-12-14 – 2017-12-17 (×4): 40 mg via ORAL
  Filled 2017-12-13 (×4): qty 4

## 2017-12-13 MED ORDER — ATORVASTATIN CALCIUM 10 MG PO TABS
10.0000 mg | ORAL_TABLET | Freq: Every evening | ORAL | Status: DC
  Administered 2017-12-13 – 2017-12-17 (×5): 10 mg via ORAL
  Filled 2017-12-13 (×5): qty 1

## 2017-12-13 MED ORDER — INSULIN DETEMIR 100 UNIT/ML ~~LOC~~ SOLN
60.0000 [IU] | Freq: Every morning | SUBCUTANEOUS | Status: DC
  Administered 2017-12-14 – 2017-12-16 (×3): 60 [IU] via SUBCUTANEOUS
  Filled 2017-12-13 (×4): qty 0.6

## 2017-12-13 MED ORDER — SODIUM CHLORIDE 0.9 % IV SOLN
1.0000 g | Freq: Once | INTRAVENOUS | Status: AC
  Administered 2017-12-13: 1 g via INTRAVENOUS
  Filled 2017-12-13: qty 10

## 2017-12-13 MED ORDER — FUROSEMIDE 20 MG PO TABS
20.0000 mg | ORAL_TABLET | Freq: Every day | ORAL | Status: DC
  Administered 2017-12-14 – 2017-12-18 (×5): 20 mg via ORAL
  Filled 2017-12-13 (×5): qty 1

## 2017-12-13 MED ORDER — METRONIDAZOLE IN NACL 5-0.79 MG/ML-% IV SOLN
500.0000 mg | Freq: Once | INTRAVENOUS | Status: AC
  Administered 2017-12-13: 500 mg via INTRAVENOUS
  Filled 2017-12-13: qty 100

## 2017-12-13 MED ORDER — SODIUM CHLORIDE 0.9 % IV SOLN
250.0000 mL | INTRAVENOUS | Status: DC | PRN
  Administered 2017-12-14: 250 mL via INTRAVENOUS
  Administered 2017-12-14: 20 mL via INTRAVENOUS
  Administered 2017-12-15: 10 mL via INTRAVENOUS
  Administered 2017-12-16 – 2017-12-17 (×3): 250 mL via INTRAVENOUS

## 2017-12-13 MED ORDER — WARFARIN SODIUM 7.5 MG PO TABS: 7.5000 mg | ORAL_TABLET | ORAL | Status: DC

## 2017-12-13 MED ORDER — ACETAMINOPHEN 650 MG RE SUPP: 650.0000 mg | Freq: Four times a day (QID) | RECTAL | Status: DC | PRN

## 2017-12-13 MED ORDER — SODIUM CHLORIDE 0.9% FLUSH: 3.0000 mL | INTRAVENOUS | Status: DC | PRN

## 2017-12-13 MED ORDER — VITAMIN D 1000 UNITS PO TABS
1000.0000 [IU] | ORAL_TABLET | Freq: Every morning | ORAL | Status: DC
  Administered 2017-12-14 – 2017-12-18 (×5): 1000 [IU] via ORAL
  Filled 2017-12-13 (×5): qty 1

## 2017-12-13 NOTE — ED Provider Notes (Signed)
[facility] Provider Note   CSN: [id number] Arrival date & time: 12/13/17  1345     History   Chief Complaint Chief Complaint  Patient presents with  . Otitis Media    HPI [name] [name] is a 83 y.o. male.  HPI Presents from ENT clinic with concern of pain, bleeding, swelling about his right TMJ and ear. Patient is here with his wife who assists with the HPI per Patient has very poor hearing, and prior surgery in the ear, as well as Bell's palsy, completing his ability to provide details of the HPI. Wife notes that the patient had partial mastoidectomy slightly more than 1 month ago after an episode of osteomyelitis. Patient had improvement in the following weeks, and now has been without antibiotics for about 3 weeks. However, over the past few days he has had increasing pain, swelling in the right mid face, as well as generalized discomfort. No reported fever. With after mentioned concern, they saw ENT physician today, Dr. Benjamine Mola.  Their physician irrigated the ear, removed cerumen, and patient was found to have physical findings concerning for recurrent osteomyelitis. Patient was sent here for evaluation. Family denies other new changes including difficulty swallowing or speaking They note new concern of persistent facial paralysis, essentially unchanged for months.  Past Medical History:  Diagnosis Date  . Atrial fibrillation ([place])   . Chronic lymphocytic leukemia ([place])   . Coronary atherosclerosis of native coronary artery    Nonobstructive  . Essential hypertension, benign   . Hyperlipidemia   . IgA nephropathy    Dr. Lowanda Foster  . Type 2 diabetes mellitus ([place])   . UTI (urinary tract infection) july  10 th   taking  med     Patient Active Problem List   Diagnosis Date Noted  . Uncontrolled type 2 diabetes mellitus with hyperglycemia, with long-term current use of insulin ([place]) 11/06/2017  . Acute mastoiditis 11/05/2017  . Atrial fibrillation,  chronic ([place]) 11/05/2017  . Chronic diastolic CHF (congestive heart failure) ([place]) 11/05/2017  . Malignant otitis externa 11/04/2017  . Mastoiditis 11/04/2017  . Disorder of right facial nerve 11/04/2017  . Facial nerve palsy 11/04/2017  . Essential hypertension 09/10/2016  . Rt Heel Osteomyelitis ([place]) 09/08/2016  . Cellulitis of heel, right 09/08/2016  . Hypoglycemia 06/21/2016  . Type 2 diabetes mellitus ([place])   . Chronic lymphocytic leukemia ([place])   . Encounter for therapeutic drug monitoring 05/16/2013  . Cardiac murmur 10/21/2011  . Long term current use of anticoagulant therapy 06/27/2010  . Mixed hyperlipidemia 05/21/2008  . CORONARY ATHEROSCLEROSIS NATIVE CORONARY ARTERY 05/21/2008  . Atrial fibrillation ([place]) 05/21/2008  . KNEE PAIN 06/14/2007    Past Surgical History:  Procedure Laterality Date  . AMPUTATION Left 09/15/2012   Procedure: PARTIAL AMPUTATION 3rd TOE LEFT FOOT;  Surgeon: Marcheta Grammes, DPM;  Location: [facility];  Service: Orthopedics;  Laterality: Left;  . AMPUTATION Left 04/20/2013   Procedure: PARTIAL AMPUTATION 2ND TOE LEFT FOOT;  Surgeon: Marcheta Grammes, DPM;  Location: [facility];  Service: Orthopedics;  Laterality: Left;  . ANAL FISSURE REPAIR    . APPLICATION OF WOUND VAC Right 09/09/2016   Procedure: APPLICATION OF WOUND VAC;  Surgeon: Caprice Beaver, DPM;  Location: [facility];  Service: Podiatry;  Laterality: Right;  . CATARACT EXTRACTION W/PHACO Right 09/30/2015   Procedure: CATARACT EXTRACTION PHACO AND INTRAOCULAR LENS PLACEMENT (IOC);  Surgeon: Tonny Branch, MD;  Location: [facility];  Service: Ophthalmology;  Laterality: Right;  CDE: 11.78  . CATARACT EXTRACTION W/PHACO Left 10/31/2015   Procedure: CATARACT EXTRACTION PHACO AND INTRAOCULAR LENS PLACEMENT LEFT EYE; CDE:  9.10;  Surgeon: Tonny Branch, MD;  Location: [facility];  Service: Ophthalmology;  Laterality: Left;  . COLONOSCOPY  08/19/2011   Procedure: COLONOSCOPY;  Surgeon: Rogene Houston, MD;   [facility];  Service: Endoscopy;  Laterality: N/A;  930  . COLONOSCOPY N/A 09/19/2014   Procedure: COLONOSCOPY;  Surgeon: Rogene Houston, MD;  [facility];  Service: Endoscopy;  Laterality: N/A;  1055  . FEMORAL HERNIA REPAIR    . INCISION AND DRAINAGE Right 09/09/2016   Procedure: DEBRIDEMENT WOUND RT heel with wound vac attachment;  Surgeon: Caprice Beaver, DPM;  Location: [facility];  Service: Podiatry;  Laterality: Right;  . INCISION AND DRAINAGE OF WOUND Right 08/12/2016   Procedure: DEBRIDEMENT WOUND RIGHT HEEL;  Surgeon: Caprice Beaver, DPM;  Location: [facility];  Service: Podiatry;  Laterality: Right;  right heel  . MASS EXCISION Right 09/28/2017   Procedure: EXCISION OF EXTERNAL AUDITORY CANAL MASS;  Surgeon: Leta Baptist, MD;  Location: [facility];  Service: ENT;  Laterality: Right;  . Open repair left quadriceps tendon.  08/22/07   Dr. Aline Brochure  . Right total knee replacement    . TRANSURETHRAL RESECTION OF PROSTATE    . TYMPANOMASTOIDECTOMY Right 11/10/2017   Procedure: RIGHT TYMPANOMASTOIDECTOMY;  Surgeon: Leta Baptist, MD;  Location: [facility];  Service: ENT;  Laterality: Right;  . WOUND EXPLORATION Right 08/12/2016   Procedure: EXPLORATION OF WOUND FOR FOREIGN BODY RIGHT HEEL;  Surgeon: Caprice Beaver, DPM;  Location: [facility];  Service: Podiatry;  Laterality: Right;  right heel        Home Medications    Prior to Admission medications   Medication Sig Start Date End Date Taking? Authorizing Provider  Ascorbic Acid (VITAMIN C) 1000 MG tablet Take 1,000 mg by mouth daily with breakfast.     [provider]  atorvastatin (LIPITOR) 10 MG tablet Take 10 mg by mouth every evening.    [provider]  Calcium Carb-Cholecalciferol (CALCIUM 600+D) 600-800 MG-UNIT TABS Take 2 tablets by mouth daily with breakfast.    [provider]  cefdinir (OMNICEF) 300 MG capsule Take 1 capsule (300 mg total) by mouth 2 (two)  times daily. 11/09/17   Orson Eva, MD  cholecalciferol (VITAMIN D) 1000 units tablet Take 1,000 Units by mouth every morning.    [provider]  Coenzyme Q10 (CO Q-10) 100 MG CAPS Take 100 mg by mouth daily with breakfast.     [provider]  digoxin (LANOXIN) 0.125 MG tablet Take 0.125 mg by mouth daily with breakfast.     [provider]  doxycycline (VIBRA-TABS) 100 MG tablet Take 1 tablet (100 mg total) by mouth 2 (two) times daily. 11/09/17   Orson Eva, MD  finasteride (PROSCAR) 5 MG tablet Take 5 mg by mouth daily.    [provider]  furosemide (LASIX) 20 MG tablet Take 2 pills a day Patient taking differently: Take 20-40 mg by mouth daily as needed. Take 2 pills a day 06/29/17   Milton Ferguson, MD  glimepiride (AMARYL) 2 MG tablet Take 2 mg by mouth daily with breakfast.  06/11/16   [provider]  insulin aspart (NOVOLOG FLEXPEN) 100 UNIT/ML FlexPen Inject 5-18 Units into the skin 3 (three) times daily with meals. Pt uses per sliding scale before lunch and dinner.  [provider]  Insulin Detemir (LEVEMIR FLEXPEN) 100 UNIT/ML Pen Inject 60 Units into the skin every morning. 11/09/17   Orson Eva, MD  lisinopril (PRINIVIL,ZESTRIL) 20 MG tablet Take 40 mg by mouth daily with supper. 07/07/16   [provider]  LORazepam (ATIVAN) 1 MG tablet Take 1 tablet by mouth at bedtime.  04/28/17   [provider]  Multiple Vitamin (MULTIVITAMIN WITH MINERALS) TABS tablet Take 1 tablet by mouth daily with breakfast.    [provider]  oxyCODONE-acetaminophen (PERCOCET) 7.5-325 MG tablet Take 1-2 tablets by mouth every 4 (four) hours as needed for severe pain. 11/10/17   Leta Baptist, MD  pioglitazone (ACTOS) 15 MG tablet Take 15 mg by mouth every evening.     [provider]  VICTOZA 18 MG/3ML SOPN Inject 1.8 mg into the skin daily with breakfast. 06/05/16   [provider]  warfarin (COUMADIN) 7.5 MG tablet Take 7.5  mg by mouth daily.    [provider]    Family History Family History  Problem Relation Age of Onset  . Atrial fibrillation Mother     Social History Social History   Tobacco Use  . Smoking status: Former Smoker    Years: 10.00    Types: Cigarettes    Last attempt to quit: 04/07/1963    Years since quitting: 54.7  . Smokeless tobacco: Never Used  Substance Use Topics  . Alcohol use: No    Alcohol/week: 0.0 standard drinks  . Drug use: No     Allergies   Erythromycin and Penicillins   Review of Systems Review of Systems  Constitutional:       Per HPI, otherwise negative  HENT:       Per HPI, otherwise negative  Respiratory:       Per HPI, otherwise negative  Cardiovascular:       Per HPI, otherwise negative  Gastrointestinal: Negative for vomiting.  Endocrine:       Negative aside from HPI  Genitourinary:       Neg aside from HPI   Musculoskeletal:       Per HPI, otherwise negative  Skin: Negative.   Neurological: Negative for syncope.     Physical Exam Updated Vital Signs BP (!) 152/53 (BP Location: Right Arm)   Pulse 87   Temp 97.8 F (36.6 C) (Tympanic)   Resp 18   Ht 6' (1.829 m)   Wt 119.3 kg   SpO2 97%   BMI 35.67 kg/m   Physical Exam  Constitutional: He is oriented to person, place, and time. He appears well-developed. No distress.  HENT:  Head: Normocephalic.  Ears:  Eyes: Conjunctivae and EOM are normal.  Cardiovascular: Normal rate and regular rhythm.  Pulmonary/Chest: Effort normal. No stridor. No respiratory distress.  Abdominal: He exhibits no distension.  Musculoskeletal: He exhibits no edema.  Neurological: He is alert and oriented to person, place, and time. A cranial nerve deficit is present.  Right facial nerve palsy  Skin: Skin is warm and dry.  Psychiatric: He has a normal mood and affect.  Nursing note and vitals reviewed.    ED Treatments / Results  Labs (all labs ordered are listed, but only abnormal  results are displayed) Labs Reviewed  CBC WITH DIFFERENTIAL/PLATELET - Abnormal; Notable for the following components:      Result Value   WBC 19.5 (*)    Lymphs Abs 10.9 (*)    All other components within normal limits  BASIC METABOLIC  PANEL - Abnormal; Notable for the following components:   Glucose, Bld 196 (*)    BUN 24 (*)    Calcium 10.6 (*)    All other components within normal limits  PATHOLOGIST SMEAR REVIEW     Procedures Procedures (including critical care time)  Medications Ordered in ED Medications  cefTRIAXone (ROCEPHIN) 1 g in sodium chloride 0.9 % 100 mL IVPB (has no administration in time range)  metroNIDAZOLE (FLAGYL) IVPB 500 mg (has no administration in time range)     Initial Impression / Assessment and Plan / ED Course  I have reviewed the triage vital signs and the nursing notes.  Pertinent labs & imaging results that were available during my care of the patient were reviewed by me and considered in my medical decision making (see chart for details).    I spoke with the patient's ENT physician , Dr. Benjamine Mola, prior to the patient's arrival.  He recommends admission, IV ABX, and he will be available for consultation.  This elderly male with recent partial mastoidectomy presents with concern for recurrent osteomyelitis of his mastoid. Patient is found leukocytosis, and given the bloody drainage, erythema, pain, the patient was started on IV ceftriaxone and Flagyl, which was his course of for most recent episode of similar affliction. Patient required admission for further evaluation and management.   Final Clinical Impressions(s) / ED Diagnoses  Acute hemorrhagic otitis externa of right ear   Carmin Muskrat, MD 12/13/17 1755

## 2017-12-13 NOTE — Progress Notes (Signed)
ANTICOAGULATION CONSULT NOTE - Initial Consult  Pharmacy Consult for warfarin Indication: atrial fibrillation  Allergies  Allergen Reactions  . Erythromycin Other (See Comments)  . Penicillins Hives and Other (See Comments)    Has patient had a PCN reaction causing immediate rash, facial/tongue/throat swelling, SOB or lightheadedness with hypotension: No Has patient had a PCN reaction causing severe rash involving mucus membranes or skin necrosis: No Has patient had a PCN reaction that required hospitalization No Has patient had a PCN reaction occurring within the last 10 years: No If all of the above answers are "NO", then may proceed with Cephalosporin use.  Has tolerated Rocephin    Patient Measurements: Height: 6' (182.9 cm) Weight: 263 lb (119.3 kg) IBW/kg (Calculated) : 77.6   Vital Signs: Temp: 97.8 F (36.6 C) (09/09 1350) Temp Source: Tympanic (09/09 1350) BP: 152/74 (09/09 1730) Pulse Rate: 73 (09/09 1730)  Labs: Recent Labs    12/13/17 1431  HGB 13.6  HCT 40.9  PLT 301  LABPROT 41.9*  INR 4.43*  CREATININE 0.89    Estimated Creatinine Clearance: 86.8 mL/min (by C-G formula based on SCr of 0.89 mg/dL).   Medical History: Past Medical History:  Diagnosis Date  . Atrial fibrillation ([place])   . Chronic lymphocytic leukemia ([place])   . Coronary atherosclerosis of native coronary artery    Nonobstructive  . Essential hypertension, benign   . Hyperlipidemia   . IgA nephropathy    Dr. Lowanda Foster  . Type 2 diabetes mellitus ([place])   . UTI (urinary tract infection) july  10 th   taking  med     Medications:  Medications Prior to Admission  Medication Sig Dispense Refill Last Dose  . Ascorbic Acid (VITAMIN C) 1000 MG tablet Take 1,000 mg by mouth daily with breakfast.    12/13/2017 at Unknown time  . atorvastatin (LIPITOR) 10 MG tablet Take 10 mg by mouth every evening.   12/12/2017 at Unknown time  . Calcium Carb-Cholecalciferol (CALCIUM 600+D) 600-800  MG-UNIT TABS Take 2 tablets by mouth daily with breakfast.   12/13/2017 at Unknown time  . cholecalciferol (VITAMIN D) 1000 units tablet Take 1,000 Units by mouth every morning.   12/13/2017 at Unknown time  . ciclopirox (LOPROX) 0.77 % cream Apply 1 application topically every morning. Applied to feet in the morning   12/13/2017 at Unknown time  . Coenzyme Q10 (CO Q-10) 100 MG CAPS Take 100 mg by mouth daily with breakfast.    12/13/2017 at Unknown time  . digoxin (LANOXIN) 0.125 MG tablet Take 0.125 mg by mouth daily with breakfast.    12/13/2017 at Unknown time  . finasteride (PROSCAR) 5 MG tablet Take 5 mg by mouth every morning.    12/13/2017 at Unknown time  . furosemide (LASIX) 20 MG tablet Take 2 pills a day (Patient taking differently: Take 20 mg by mouth daily. ) 20 tablet 0 12/13/2017 at Unknown time  . glimepiride (AMARYL) 2 MG tablet Take 2 mg by mouth daily with breakfast.    12/13/2017 at Unknown time  . insulin aspart (NOVOLOG FLEXPEN) 100 UNIT/ML FlexPen Inject 4-18 Units into the skin 2 (two) times daily. Pt uses per sliding scale before lunch and dinner.   12/13/2017 at Unknown time  . Insulin Detemir (LEVEMIR FLEXPEN) 100 UNIT/ML Pen Inject 60 Units into the skin every morning. 15 mL 0 12/13/2017 at Unknown time  . lisinopril (PRINIVIL,ZESTRIL) 20 MG tablet Take 40 mg by mouth daily with supper.   12/12/2017 at  Unknown time  . LORazepam (ATIVAN) 1 MG tablet Take 2 tablets by mouth at bedtime.    12/12/2017 at Unknown time  . Multiple Vitamin (MULTIVITAMIN WITH MINERALS) TABS tablet Take 1 tablet by mouth daily with breakfast.   12/13/2017 at Unknown time  . Omega-3 Fatty Acids (FISH OIL) 1200 MG CAPS Take 2 capsules by mouth 2 (two) times daily.   12/13/2017 at Unknown time  . pioglitazone (ACTOS) 15 MG tablet Take 15 mg by mouth every evening.    12/12/2017 at Unknown time  . VICTOZA 18 MG/3ML SOPN Inject 1.8 mg into the skin daily with breakfast.   12/13/2017 at Unknown time  . vitamin E 400 UNIT capsule Take  400 Units by mouth every morning.   12/13/2017 at Unknown time  . warfarin (COUMADIN) 7.5 MG tablet Take 5-7.5 mg by mouth See admin instructions. 5mg  on Tuesdays and Fridays. Takes 7.5mg  on all other days   12/12/2017 at 18-1900    Assessment: Pharmacy consulted to dose warfarin for patient with atrial fibrillation.  Patient's INR on admission is supratherapeutic at 4.43 Goal of Therapy:  INR 2-3 Monitor platelets by anticoagulation protocol: Yes   Plan:  Hold warfarin x 1 dose Monitor daily INR and s/s of bleeding  Ramond Craver 12/13/2017,9:10 PM

## 2017-12-13 NOTE — ED Triage Notes (Signed)
Pt just has left ear surgery 8/27.  Pt was sent by Benjamine Mola MD for antibiotics.  Pt c/o of ear pain, drainage and bell palsy to the left side of the face according to family.

## 2017-12-13 NOTE — ED Notes (Addendum)
Date and time results received: 12/13/17 8:48 PM (use smartphrase ".now" to insert current time)  Test: INR Critical Value: 4.43  Name of Provider Notified: Dr Vanita Panda Orders Received? Or Actions Taken?: no new orders received at this time

## 2017-12-13 NOTE — H&P (Signed)
[facility] History and Physical  [name] [name] JJO:[id number] DOB: [birth year] DOA: 12/13/2017  Referring physician: Dr [name] [name] PCP: [name] Blitz, MD   Chief Complaint: R ear pain, recurrent infection   HPI: [name] [name] is a 83 y.o. male w/ hx of DM2, IgA neph, HTN, CLL, afib on coumadin. Pt was admitted here for tympanomastoidectomy done on 11/10/17 by ENT [name] [name]. He then had a 2nd surgery Pt returned to his office today w/ recurrent ear pain and sent to ED for admission for IV abx. His history of late is as below.  He had cyst removal in June 2019 then developed severe external otitis requiring I&D done in August by [name] [name] after several days of  IV abx in the hospital.  R ear pain has now been worsening again over the last week or so, and BS's have gone up in the 260 range. They saw [name] [name] today in his office , he recommended admit for IV abx.  In ED rec'd IV rocephin and flagyl.    Pt has no c/o other than R ear pain. No fever, chills, abd pian , no n/v/d, no CP , no SOB.     Recent history:  06/2016  > fevers/ hematuria , +UTI/ sepsis rx'd w/ Rocephin IV. dc'd on omnicef. Coumadin for afib. CLL. AME.   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 Got 4-5 days IV abx and dc'd on 9d of cefdinir and to have surgical debridement on 11/10/17 w/ [name] [name].  CAF, Ramsey Hunt rx'd w/ valtrex and pred, DM w/ uncont BS, HTN.   ROS  denies CP  no joint pain   no HA  no blurry vision  no rash  no diarrhea  no nausea/ vomiting   no dysuria  no difficulty voiding  no change in urine color    Past Medical History  Past Medical History:  Diagnosis Date  . Atrial fibrillation ([place])   . Chronic lymphocytic leukemia ([place])   . Coronary atherosclerosis of native coronary artery    Nonobstructive  . Essential hypertension, benign   . Hyperlipidemia   . IgA nephropathy    [name] [name]  . Type 2 diabetes mellitus ([place])   . UTI (urinary tract infection) july  10 th   taking  med    Past Surgical History  Past Surgical History:  Procedure Laterality Date  . AMPUTATION Left 09/15/2012   Procedure: PARTIAL AMPUTATION 3rd TOE LEFT FOOT;  Surgeon: [name] [name], DPM;  Location: [facility];  Service: Orthopedics;  Laterality: Left;  . AMPUTATION Left 04/20/2013   Procedure: PARTIAL AMPUTATION 2ND TOE LEFT FOOT;  Surgeon: [name] [name], DPM;  Location: [facility];  Service: Orthopedics;  Laterality: Left;  . ANAL FISSURE REPAIR    . APPLICATION OF WOUND VAC Right 09/09/2016   Procedure: APPLICATION OF WOUND VAC;  Surgeon: [name] [name], DPM;  Location: [facility];  Service: Podiatry;  Laterality: Right;  . CATARACT EXTRACTION W/PHACO Right 09/30/2015   Procedure: CATARACT EXTRACTION PHACO AND INTRAOCULAR  LENS PLACEMENT (IOC);  Surgeon: [name] Branch, MD;  Location: [facility];  Service: Ophthalmology;  Laterality: Right;  CDE: 11.78  . CATARACT EXTRACTION W/PHACO Left 10/31/2015   Procedure: CATARACT EXTRACTION PHACO AND INTRAOCULAR LENS PLACEMENT LEFT EYE; CDE:  9.10;  Surgeon: [name] Branch, MD;  Location: [facility];  Service: Ophthalmology;  Laterality: Left;  . COLONOSCOPY  08/19/2011   Procedure: COLONOSCOPY;  Surgeon: [name] Houston, MD;  [facility];  Service: Endoscopy;  Laterality: N/A;  930  . COLONOSCOPY N/A 09/19/2014   Procedure: COLONOSCOPY;  Surgeon: [name] Houston, MD;  [facility];  Service: Endoscopy;  Laterality: N/A;  1055  . FEMORAL HERNIA REPAIR    . INCISION AND DRAINAGE Right  09/09/2016   Procedure: DEBRIDEMENT WOUND RT heel with wound vac attachment;  Surgeon: [name] [name], DPM;  Location: [facility];  Service: Podiatry;  Laterality: Right;  . INCISION AND DRAINAGE OF WOUND Right 08/12/2016   Procedure: DEBRIDEMENT WOUND RIGHT HEEL;  Surgeon: [name] [name], DPM;  Location: [facility];  Service: Podiatry;  Laterality: Right;  right heel  . MASS EXCISION Right 09/28/2017   Procedure: EXCISION OF EXTERNAL AUDITORY CANAL MASS;  Surgeon: [name] Baptist, MD;  Location: [facility];  Service: ENT;  Laterality: Right;  . Open repair left quadriceps tendon.  08/22/07   [name]. Aline [name]  . Right total knee replacement    . TRANSURETHRAL RESECTION OF PROSTATE    . TYMPANOMASTOIDECTOMY Right 11/10/2017   Procedure: RIGHT TYMPANOMASTOIDECTOMY;  Surgeon: [name] Baptist, MD;  Location: [facility];  Service: ENT;  Laterality: Right;  . WOUND EXPLORATION Right 08/12/2016   Procedure: EXPLORATION OF WOUND FOR FOREIGN BODY RIGHT HEEL;  Surgeon: [name] [name], DPM;  Location: [facility];  Service: Podiatry;  Laterality: Right;  right heel   Family History  Family History  Problem Relation Age of Onset  . Atrial fibrillation Mother    Social History  reports that he quit smoking about 54 years ago. His smoking use included cigarettes. He quit after 10.00 years of use. He has never used smokeless tobacco. He reports that he does not drink alcohol or use drugs. Allergies  Allergies  Allergen Reactions  . Erythromycin Other (See Comments)  . Penicillins Hives and Other (See Comments)    Has patient had a PCN reaction causing immediate rash, facial/tongue/throat swelling, SOB or lightheadedness with hypotension: No Has patient had a PCN reaction causing severe rash involving mucus membranes or skin necrosis: No Has patient had a PCN reaction that required hospitalization No Has patient had a PCN reaction occurring within the last 10 years: No If all of the above answers are  "NO", then may proceed with Cephalosporin use.  Has tolerated Rocephin   Home medications Prior to Admission medications   Medication Sig Start Date End Date Taking? Authorizing Provider  Ascorbic Acid (VITAMIN C) 1000 MG tablet Take 1,000 mg by mouth daily with breakfast.     [provider]  atorvastatin (LIPITOR) 10 MG tablet Take 10 mg by mouth every evening.    [provider]  Calcium Carb-Cholecalciferol (CALCIUM 600+D) 600-800 MG-UNIT TABS Take 2 tablets by mouth daily with breakfast.    [provider]  cefdinir (OMNICEF) 300 MG capsule Take 1 capsule (300 mg total) by mouth 2 (two) times daily. 11/09/17   Orson Eva, MD  cholecalciferol (VITAMIN D) 1000 units tablet Take 1,000 Units by mouth every morning.    [provider]  Coenzyme Q10 (CO Q-10) 100 MG CAPS Take 100 mg by mouth daily with breakfast.     [provider]  digoxin (LANOXIN) 0.125 MG tablet Take 0.125 mg by mouth daily with breakfast.     [provider]  doxycycline (VIBRA-TABS) 100 MG tablet Take 1 tablet (100 mg total) by mouth 2 (two) times daily. 11/09/17   Orson Eva, MD  finasteride (PROSCAR) 5 MG tablet Take 5 mg by mouth daily.    [provider]  furosemide (LASIX) 20 MG tablet Take 2 pills a day Patient taking differently: Take 20-40 mg by mouth daily as needed. Take 2 pills a day 06/29/17   Milton Ferguson, MD  glimepiride (AMARYL) 2 MG tablet Take 2 mg by mouth daily with breakfast.  06/11/16   [provider]  insulin aspart (NOVOLOG FLEXPEN) 100 UNIT/ML FlexPen Inject 5-18 Units into the skin 3 (three) times daily with meals. Pt uses per sliding scale before lunch and dinner.    [provider]  Insulin Detemir (LEVEMIR FLEXPEN) 100 UNIT/ML Pen Inject 60 Units into the skin every morning. 11/09/17   Orson Eva, MD  lisinopril (PRINIVIL,ZESTRIL) 20 MG tablet Take 40 mg by mouth daily with supper. 07/07/16   [provider]   LORazepam (ATIVAN) 1 MG tablet Take 1 tablet by mouth at bedtime.  04/28/17   [provider]  Multiple Vitamin (MULTIVITAMIN WITH MINERALS) TABS tablet Take 1 tablet by mouth daily with breakfast.    [provider]  oxyCODONE-acetaminophen (PERCOCET) 7.5-325 MG tablet Take 1-2 tablets by mouth every 4 (four) hours as needed for severe pain. 11/10/17   [name] Baptist, MD  pioglitazone (ACTOS) 15 MG tablet Take 15 mg by mouth every evening.     [provider]  VICTOZA 18 MG/3ML SOPN Inject 1.8 mg into the skin daily with breakfast. 06/05/16   [provider]  warfarin (COUMADIN) 7.5 MG tablet Take 7.5 mg by mouth daily.    [provider]   Liver Function Tests No results for input(s): AST, ALT, ALKPHOS, BILITOT, PROT, ALBUMIN in the last 168 hours. No results for input(s): LIPASE, AMYLASE in the last 168 hours. CBC Recent Labs  Lab 12/13/17 1431  WBC 19.5*  NEUTROABS 7.4  HGB 13.6  HCT 40.9  MCV 87.0  PLT 326   Basic Metabolic Panel Recent Labs  Lab 12/13/17 1431  NA 136  K 4.1  CL 99  CO2 29  GLUCOSE 196*  BUN 24*  CREATININE 0.89  CALCIUM 10.6*     Vitals:   12/13/17 1350 12/13/17 1351  BP: (!) 152/53   Pulse: 87   Resp: 18   Temp: 97.8 F (36.6 C)   TempSrc: Tympanic   SpO2: 97%   Weight:  119.3 kg  Height:  6' (1.829 m)   Exam: Gen alert, R facial nerve palsy No rash, cyanosis or gangrene Sclera anicteric, throat clear  R ear w/ cotton in canal, not removed No jvd or bruits Chest clear bilat to bases RRR no MRG Abd soft ntnd no mass or ascites +bs GU normal male MS no joint effusions or deformity Ext 1+ pretib edema / no wounds or ulcers Neuro is alert, Ox 3 , nf     Home meds:  - digoxin 0.125 qd/ furosemide 20-40mg  qd/ lisinopril 40 hs  - glimepiride 2mg  qam/ insulin aspart tid ssi ac/ insulin detemir 60 qam/ pioglitazone 15 hs/ victoza 1.8 mg qam sq  - warfarin 7.5 qd  -  atorvastatin 10 / finasteride 5  qd/ lorazepam 1 mg hs/ percocet prn  - vitamins/ prns    Na 136  K 4.1  BUn 24  Cr 0.89  Ca 10.6  WBC 19k  Hb 13 plt ok    Assessment: 1. R ear pain / ^WBC 19k - recurrent hemorrhagic external otitis, hx of I&D in August 2019.  Will admit and start on IV abx (Rocephin/ flagyl) at request of ENT.  They will follow while here.  2. DM2 - other main issue, cont detemir 60 u /day w/ SSI and hold other noninsulin meds from home. Diabetic counselor consult.  3. AFib - chronic , on coumadin/ dig, continue. Check INR.  4. HTN - BP's OK, vol ok, mild pretib edema. Cont lasix 20 qd and lisinopril.  Creat is normal.  5. H/O CLL     Plan - as above       [place] [facility] Pager [phone]   If 7PM-7AM, please contact night-coverage [email or web address] Password [email or web address] 12/13/2017, 5:52 PM

## 2017-12-14 DIAGNOSIS — E118 Type 2 diabetes mellitus with unspecified complications: Secondary | ICD-10-CM

## 2017-12-14 LAB — GLUCOSE, CAPILLARY
GLUCOSE-CAPILLARY: 161 mg/dL — AB (ref 70–99)
Glucose-Capillary: 224 mg/dL — ABNORMAL HIGH (ref 70–99)
Glucose-Capillary: 290 mg/dL — ABNORMAL HIGH (ref 70–99)
Glucose-Capillary: 174 mg/dL — ABNORMAL HIGH (ref 70–99)

## 2017-12-14 LAB — COMPREHENSIVE METABOLIC PANEL
ALBUMIN: 3.1 g/dL — AB (ref 3.5–5.0)
ALK PHOS: 98 U/L (ref 38–126)
ALT: 17 U/L (ref 0–44)
ANION GAP: 8 (ref 5–15)
AST: 19 U/L (ref 15–41)
BUN: 23 mg/dL (ref 8–23)
CALCIUM: 9.8 mg/dL (ref 8.9–10.3)
CHLORIDE: 100 mmol/L (ref 98–111)
CO2: 30 mmol/L (ref 22–32)
CREATININE: 0.86 mg/dL (ref 0.61–1.24)
GFR calc Af Amer: >60 mL/min (ref >60)
GFR calc non Af Amer: >60 mL/min (ref >60)
GLUCOSE: 196 mg/dL — AB (ref 70–99)
Potassium: 4.5 mmol/L (ref 3.5–5.1)
SODIUM: 138 mmol/L (ref 135–145)
Total Bilirubin: 0.8 mg/dL (ref 0.3–1.2)
Total Protein: 6.2 g/dL — ABNORMAL LOW (ref 6.5–8.1)

## 2017-12-14 LAB — URINALYSIS, ROUTINE W REFLEX MICROSCOPIC
Bilirubin Urine: NEGATIVE
Glucose, UA: NEGATIVE mg/dL
Hgb urine dipstick: NEGATIVE
Ketones, ur: NEGATIVE mg/dL
Leukocytes, UA: NEGATIVE
NITRITE: NEGATIVE
PROTEIN: NEGATIVE mg/dL
SPECIFIC GRAVITY, URINE: 1.008 (ref 1.005–1.030)
pH: 6 (ref 5.0–8.0)

## 2017-12-14 LAB — CBC
HCT: 39 % (ref 39.0–52.0)
HEMOGLOBIN: 12.6 g/dL — AB (ref 13.0–17.0)
MCH: 28.4 pg (ref 26.0–34.0)
MCHC: 32.3 g/dL (ref 30.0–36.0)
MCV: 87.8 fL (ref 78.0–100.0)
Platelets: 243 10*3/uL (ref 150–400)
RBC: 4.44 MIL/uL (ref 4.22–5.81)
RDW: 14.9 % (ref 11.5–15.5)
WBC: 15.5 10*3/uL — ABNORMAL HIGH (ref 4.0–10.5)

## 2017-12-14 LAB — PROTIME-INR
INR: 4.28
PROTHROMBIN TIME: 40.8 s — AB (ref 11.4–15.2)

## 2017-12-14 LAB — HEMOGLOBIN A1C
Hgb A1c MFr Bld: 9.5 % — ABNORMAL HIGH (ref 4.8–5.6)
Mean Plasma Glucose: 225.95 mg/dL

## 2017-12-14 MED ORDER — INSULIN ASPART 100 UNIT/ML ~~LOC~~ SOLN: 0.0000 [IU] | Freq: Every day | SUBCUTANEOUS | Status: DC

## 2017-12-14 MED ORDER — GLUCERNA SHAKE PO LIQD
237.0000 mL | Freq: Two times a day (BID) | ORAL | Status: DC
  Administered 2017-12-14 – 2017-12-17 (×4): 237 mL via ORAL

## 2017-12-14 MED ORDER — INSULIN ASPART 100 UNIT/ML ~~LOC~~ SOLN
3.0000 [IU] | Freq: Three times a day (TID) | SUBCUTANEOUS | Status: DC
  Administered 2017-12-14 – 2017-12-16 (×6): 3 [IU] via SUBCUTANEOUS

## 2017-12-14 MED ORDER — INSULIN ASPART 100 UNIT/ML ~~LOC~~ SOLN
0.0000 [IU] | Freq: Three times a day (TID) | SUBCUTANEOUS | Status: DC
  Administered 2017-12-14: 5 [IU] via SUBCUTANEOUS
  Administered 2017-12-14 – 2017-12-15 (×2): 2 [IU] via SUBCUTANEOUS
  Administered 2017-12-15: 3 [IU] via SUBCUTANEOUS
  Administered 2017-12-15: 2 [IU] via SUBCUTANEOUS
  Administered 2017-12-16: 3 [IU] via SUBCUTANEOUS

## 2017-12-14 NOTE — Progress Notes (Signed)
Initial Nutrition Assessment  DOCUMENTATION CODES:   Obesity unspecified  (will complete NFPE at follow-up)  INTERVENTION:   - Continue MVI with minerals daily  - Glucerna Shake po BID, each supplement provides 220 kcal and 10 grams of protein  - Took pt preferences and entered into [employer] software (no juice, no biscuit, unsweetened tea with breakfast)  - Encourage adequate PO intake  NUTRITION DIAGNOSIS:   Unintentional weight loss related to mouth pain, decreased appetite as evidenced by per patient/family report, percent weight loss.  GOAL:   Patient will meet greater than or equal to 90% of their needs  MONITOR:   PO intake, Supplement acceptance, Weight trends, Labs, Skin  REASON FOR ASSESSMENT:   Malnutrition Screening Tool    ASSESSMENT:   83 year old male who presented to the ED with concern of pain, bleeding, and swelling around his right TMJ and ear. Pt with recent tympanomastoidectomy. Concern for recurrent osteomyelitis of mastoid. PMH significant for type 2 diabetes mellitus, IgA nephropathy, CLL, hypertension, hyperlipidemia, and atrial fibrillation on Coumadin.  Spoke with pt and family members at bedside. Pt states that due to Bells Palsy and pain on the right side of his face, he has difficulty chewing. Pt also reports that the pain has caused him to have a decreased appetite.  Pt states that despite his decreased appetite, he still eats 3 meals and a snack daily. Pt reports eating 100% of his lunch tray.  Breakfast: cereal Lunch: soup Dinner: spaghetti with meat sauce Snack: Premier Protein shake before bed (pt's wife states that this causes pt's blood sugars to be high in the morning when he wakes) or sugar-free jello with cottage cheese  Pt endorses weight loss since starting to have issues with his jaw about 2-3 months ago. Pt reports losing weight throughout July and August of this year. Pt reports his UBW as 255 lbs. Per weight history  in chart, pt has lost 37 lbs over the past 5 months. This is a 12.6% weight loss which is significant for timeframe. Suspect pt with some degree of acute malnutrition given significant weight loss but unable to confirm at this time without nutrition-focused physical exam.  Pt agreeable to trying Glucerna oral nutrition supplement during admission.  Meal Completion: 75%  Medications reviewed and include: Oscal with D daily, 1000 unit vitamin D daily, 20 mg Lasix daily, sliding scale Novolog, 60 units Levemir daily, MVI with minerals, 1000 mg vitamin C daily, IV antibiotics  Labs reviewed. CBG's: 290, 224, 161  NUTRITION - FOCUSED PHYSICAL EXAM:  Pt and family declined at this time. Will re-attempt at follow-up.  Diet Order:   Diet Order            Diet Carb Modified Fluid consistency: Thin; Room service appropriate? Yes  Diet effective now              EDUCATION NEEDS:   No education needs have been identified at this time  Skin:  Skin Assessment: Reviewed RN Assessment  Last BM:  12/12/17  Height:   Ht Readings from Last 1 Encounters:  12/13/17 6' (1.829 m)    Weight:   Wt Readings from Last 1 Encounters:  12/14/17 115.8 kg    Ideal Body Weight:  80.91 kg  BMI:  Body mass index is 34.61 kg/m.  Estimated Nutritional Needs:   Kcal:  1900-2100  Protein:  95-110 grams  Fluid:  >/= 1.9 L    Gaynell Face, MS, RD, LDN Pager: [phone]  Weekend/After Hours: [phone]

## 2017-12-14 NOTE — Progress Notes (Signed)
CRITICAL VALUE ALERT  Critical Value:  INR 4.28  Date & Time Notied:  12/14/17  Provider Notified: Dr. Olevia Bowens  Orders Received/Actions taken: Monitor

## 2017-12-14 NOTE — Progress Notes (Signed)
[facility] PROGRESS NOTE    Progress Note  [name] [name]  DUK:[id number] DOB: [birth year] DOA: 12/13/2017 PCP: [name], MD     Brief Narrative:   [name] [name] is an 83 y.o. male past medical history of diabetes mellitus type 2, IgA nephropathy, essential hypertension, CLL, chronic atrial fibrillation on Coumadin recently admitted for tympanomastoidectomy on 11/10/2017 by ENT who required a second intervention, patient return to the ENTs office on the day of admission due to ear pain was sent to the ED for IV antibiotics, with a blood glucose of 260.  Assessment/Plan:   Hemorrhagic otitis externa of right ear: Started empirically on IV Rocephin and Flagyl as requested by ENT. Awaiting ENT recommendations. Her leukocytosis is improving she has remained afebrile.  Long term current use of anticoagulant therapy: Continue Coumadin per pharmacy INR was  Type 2 diabetes mellitus [facility]): Check an A1c continue sliding scale insulin.  Essential hypertension: Blood pressure seems to be stable continue current medication.   DVT prophylaxis: coumadin Family Communication:wife Disposition Plan/Barrier to D/C: unable Code Status:     Code Status Orders  (From admission, onward)         Start     Ordered   12/13/17 1932  Full code  Continuous     12/13/17 1932        Code Status History    Date Active Date Inactive Code Status Order ID Comments User Context   11/04/2017 1948 11/09/2017 1522 Full Code [id number]  Roney Jaffe, MD Inpatient   09/08/2016 2241 09/11/2016 2146 Full Code [id number]  Roxan Hockey, MD Inpatient   06/21/2016 0142 06/25/2016 2057 Full Code [id number]  Phillips Grout, MD Inpatient    Advance Directive Documentation     Most Recent Value  Type of Advance Directive  Healthcare Power of Attorney, Living will  Pre-existing out of facility DNR order (yellow form or pink MOST form)  -  "MOST" Form in Place?  -        IV Access:    Peripheral  IV   Procedures and diagnostic studies:   No results found.   Medical Consultants:    None.  Anti-Infectives:   IV rocephin and flagyl  Subjective:    [name] [name] he relates his pain is improved he is hungry minimal discharge per ear.  Objective:    Vitals:   12/13/17 2116 12/13/17 2117 12/14/17 0500 12/14/17 0634  BP:  (!) 144/77  (!) 141/75  Pulse:  79  90  Resp:  19  18  Temp:  (!) 97.4 F (36.3 C)  99.5 F (37.5 C)  TempSrc:  Oral  Oral  SpO2:  99%  98%  Weight: 116.7 kg  115.8 kg   Height: 6' (1.829 m)       Intake/Output Summary (Last 24 hours) at 12/14/2017 0833 Last data filed at 12/14/2017 0700 Gross per 24 hour  Intake 211.67 ml  Output 1000 ml  Net -788.33 ml   Filed Weights   12/13/17 1351 12/13/17 2116 12/14/17 0500  Weight: 119.3 kg 116.7 kg 115.8 kg    Exam: General exam: In no acute distress. Respiratory system: Good air movement and clear to auscultation. Cardiovascular system: S1 & S2 heard, RRR.  Gastrointestinal system: Abdomen is nondistended, soft and nontender.  Central nervous system: Alert and oriented. No focal neurological deficits. Extremities: No pedal edema. Skin: No rashes, lesions or ulcers   Data Reviewed:    Labs: Basic Metabolic Panel: Recent Labs  Lab 12/13/17 1431 12/14/17 0527  NA 136 138  K 4.1 4.5  CL 99 100  CO2 29 30  GLUCOSE 196* 196*  BUN 24* 23  CREATININE 0.89 0.86  CALCIUM 10.6* 9.8   GFR Estimated Creatinine Clearance: 88.5 mL/min (by C-G formula based on SCr of 0.86 mg/dL). Liver Function Tests: Recent Labs  Lab 12/14/17 0527  AST 19  ALT 17  ALKPHOS 98  BILITOT 0.8  PROT 6.2*  ALBUMIN 3.1*   No results for input(s): LIPASE, AMYLASE in the last 168 hours. No results for input(s): AMMONIA in the last 168 hours. Coagulation profile Recent Labs  Lab 12/13/17 1431 12/13/17 2157  INR 4.43* 4.28*    CBC: Recent Labs  Lab 12/13/17 1431 12/14/17 0527  WBC 19.5* 15.5*   NEUTROABS 7.4  --   HGB 13.6 12.6*  HCT 40.9 39.0  MCV 87.0 87.8  PLT 301 243   Cardiac Enzymes: No results for input(s): CKTOTAL, CKMB, CKMBINDEX, TROPONINI in the last 168 hours. BNP (last 3 results) No results for input(s): PROBNP in the last 8760 hours. CBG: Recent Labs  Lab 12/13/17 2120 12/14/17 0827  GLUCAP 161* 224*   D-Dimer: No results for input(s): DDIMER in the last 72 hours. Hgb A1c: No results for input(s): HGBA1C in the last 72 hours. Lipid Profile: No results for input(s): CHOL, HDL, LDLCALC, TRIG, CHOLHDL, LDLDIRECT in the last 72 hours. Thyroid function studies: No results for input(s): TSH, T4TOTAL, T3FREE, THYROIDAB in the last 72 hours.  Invalid input(s): FREET3 Anemia work up: No results for input(s): VITAMINB12, FOLATE, FERRITIN, TIBC, IRON, RETICCTPCT in the last 72 hours. Sepsis Labs: Recent Labs  Lab 12/13/17 1431 12/14/17 0527  WBC 19.5* 15.5*   Microbiology No results found for this or any previous visit (from the past 240 hour(s)).   Medications:   . atorvastatin  10 mg Oral QPM  . calcium-vitamin D  2 tablet Oral Q breakfast  . cholecalciferol  1,000 Units Oral q morning - 10a  . digoxin  0.125 mg Oral Q breakfast  . finasteride  5 mg Oral q morning - 10a  . furosemide  20 mg Oral Daily  . insulin detemir  60 Units Subcutaneous q morning - 10a  . lisinopril  40 mg Oral Q supper  . multivitamin with minerals  1 tablet Oral Q breakfast  . sodium chloride flush  3 mL Intravenous Q12H  . vitamin C  1,000 mg Oral Q breakfast   Continuous Infusions: . sodium chloride 250 mL (12/14/17 0205)  . cefTRIAXone (ROCEPHIN)  IV    . metronidazole Stopped (12/14/17 0308)      LOS: 1 day   [facility] Pager [phone]  *Please refer to [email or web address], password [email or web address] to get updated schedule on who will round on this patient, as hospitalists switch teams weekly. If 7PM-7AM, please contact night-coverage at  [email or web address], password [email or web address] for any overnight needs.  12/14/2017, 8:33 AM

## 2017-12-14 NOTE — Progress Notes (Signed)
ANTICOAGULATION CONSULT NOTE - Initial Consult  Pharmacy Consult for warfarin Indication: atrial fibrillation  Allergies  Allergen Reactions  . Erythromycin Other (See Comments)  . Penicillins Hives and Other (See Comments)    Has patient had a PCN reaction causing immediate rash, facial/tongue/throat swelling, SOB or lightheadedness with hypotension: No Has patient had a PCN reaction causing severe rash involving mucus membranes or skin necrosis: No Has patient had a PCN reaction that required hospitalization No Has patient had a PCN reaction occurring within the last 10 years: No If all of the above answers are "NO", then may proceed with Cephalosporin use.  Has tolerated Rocephin    Patient Measurements: Height: 6' (182.9 cm) Weight: 255 lb 3.2 oz (115.8 kg) IBW/kg (Calculated) : 77.6   Vital Signs: Temp: 99.5 F (37.5 C) (09/10 0634) Temp Source: Oral (09/10 0634) BP: 141/75 (09/10 0634) Pulse Rate: 90 (09/10 0634)  Labs: Recent Labs    12/13/17 1431 12/13/17 2157 12/14/17 0527  HGB 13.6  --  12.6*  HCT 40.9  --  39.0  PLT 301  --  243  LABPROT 41.9* 40.8*  --   INR 4.43* 4.28*  --   CREATININE 0.89  --  0.86    Estimated Creatinine Clearance: 88.5 mL/min (by C-G formula based on SCr of 0.86 mg/dL).   Medical History: Past Medical History:  Diagnosis Date  . Atrial fibrillation ([place])   . Chronic lymphocytic leukemia ([place])   . Coronary atherosclerosis of native coronary artery    Nonobstructive  . Essential hypertension, benign   . Hyperlipidemia   . IgA nephropathy    Dr. Lowanda Foster  . Type 2 diabetes mellitus ([place])   . UTI (urinary tract infection) july  10 th   taking  med     Medications:  Medications Prior to Admission  Medication Sig Dispense Refill Last Dose  . Ascorbic Acid (VITAMIN C) 1000 MG tablet Take 1,000 mg by mouth daily with breakfast.    12/13/2017 at Unknown time  . atorvastatin (LIPITOR) 10 MG tablet Take 10 mg by mouth every  evening.   12/12/2017 at Unknown time  . Calcium Carb-Cholecalciferol (CALCIUM 600+D) 600-800 MG-UNIT TABS Take 2 tablets by mouth daily with breakfast.   12/13/2017 at Unknown time  . cholecalciferol (VITAMIN D) 1000 units tablet Take 1,000 Units by mouth every morning.   12/13/2017 at Unknown time  . ciclopirox (LOPROX) 0.77 % cream Apply 1 application topically every morning. Applied to feet in the morning   12/13/2017 at Unknown time  . Coenzyme Q10 (CO Q-10) 100 MG CAPS Take 100 mg by mouth daily with breakfast.    12/13/2017 at Unknown time  . digoxin (LANOXIN) 0.125 MG tablet Take 0.125 mg by mouth daily with breakfast.    12/13/2017 at Unknown time  . finasteride (PROSCAR) 5 MG tablet Take 5 mg by mouth every morning.    12/13/2017 at Unknown time  . furosemide (LASIX) 20 MG tablet Take 2 pills a day (Patient taking differently: Take 20 mg by mouth daily. ) 20 tablet 0 12/13/2017 at Unknown time  . glimepiride (AMARYL) 2 MG tablet Take 2 mg by mouth daily with breakfast.    12/13/2017 at Unknown time  . insulin aspart (NOVOLOG FLEXPEN) 100 UNIT/ML FlexPen Inject 4-18 Units into the skin 2 (two) times daily. Pt uses per sliding scale before lunch and dinner.   12/13/2017 at Unknown time  . Insulin Detemir (LEVEMIR FLEXPEN) 100 UNIT/ML Pen Inject 60 Units into  the skin every morning. 15 mL 0 12/13/2017 at Unknown time  . lisinopril (PRINIVIL,ZESTRIL) 20 MG tablet Take 40 mg by mouth daily with supper.   12/12/2017 at Unknown time  . LORazepam (ATIVAN) 1 MG tablet Take 2 tablets by mouth at bedtime.    12/12/2017 at Unknown time  . Multiple Vitamin (MULTIVITAMIN WITH MINERALS) TABS tablet Take 1 tablet by mouth daily with breakfast.   12/13/2017 at Unknown time  . Omega-3 Fatty Acids (FISH OIL) 1200 MG CAPS Take 2 capsules by mouth 2 (two) times daily.   12/13/2017 at Unknown time  . pioglitazone (ACTOS) 15 MG tablet Take 15 mg by mouth every evening.    12/12/2017 at Unknown time  . VICTOZA 18 MG/3ML SOPN Inject 1.8 mg into  the skin daily with breakfast.   12/13/2017 at Unknown time  . vitamin E 400 UNIT capsule Take 400 Units by mouth every morning.   12/13/2017 at Unknown time  . warfarin (COUMADIN) 7.5 MG tablet Take 5-7.5 mg by mouth See admin instructions. 5mg  on Tuesdays and Fridays. Takes 7.5mg  on all other days   12/12/2017 at 18-1900    Assessment: Pharmacy consulted to dose warfarin for patient with atrial fibrillation.  Patient's INR  is supratherapeutic at 4.28  Goal of Therapy:  INR 2-3 Monitor platelets by anticoagulation protocol: Yes   Plan:  Hold warfarin x 1 dose Monitor daily INR and s/s of bleeding  Margot Ables, PharmD Clinical Pharmacist 12/14/2017 9:05 AM

## 2017-12-15 DIAGNOSIS — I482 Chronic atrial fibrillation: Secondary | ICD-10-CM

## 2017-12-15 DIAGNOSIS — H60321 Hemorrhagic otitis externa, right ear: Principal | ICD-10-CM

## 2017-12-15 DIAGNOSIS — I10 Essential (primary) hypertension: Secondary | ICD-10-CM

## 2017-12-15 DIAGNOSIS — E1165 Type 2 diabetes mellitus with hyperglycemia: Secondary | ICD-10-CM

## 2017-12-15 DIAGNOSIS — H70001 Acute mastoiditis without complications, right ear: Secondary | ICD-10-CM

## 2017-12-15 DIAGNOSIS — Z794 Long term (current) use of insulin: Secondary | ICD-10-CM

## 2017-12-15 DIAGNOSIS — Z7901 Long term (current) use of anticoagulants: Secondary | ICD-10-CM

## 2017-12-15 LAB — CBC
HCT: 38.9 % — ABNORMAL LOW (ref 39.0–52.0)
HEMOGLOBIN: 12.8 g/dL — AB (ref 13.0–17.0)
MCH: 28.9 pg (ref 26.0–34.0)
MCHC: 32.9 g/dL (ref 30.0–36.0)
MCV: 87.8 fL (ref 78.0–100.0)
Platelets: 218 10*3/uL (ref 150–400)
RBC: 4.43 MIL/uL (ref 4.22–5.81)
RDW: 14.7 % (ref 11.5–15.5)
WBC: 14.1 10*3/uL — ABNORMAL HIGH (ref 4.0–10.5)

## 2017-12-15 LAB — GLUCOSE, CAPILLARY
GLUCOSE-CAPILLARY: 194 mg/dL — AB (ref 70–99)
Glucose-Capillary: 167 mg/dL — ABNORMAL HIGH (ref 70–99)
Glucose-Capillary: 225 mg/dL — ABNORMAL HIGH (ref 70–99)
Glucose-Capillary: 189 mg/dL — ABNORMAL HIGH (ref 70–99)

## 2017-12-15 LAB — PROTIME-INR
INR: 3.16
PROTHROMBIN TIME: 32.2 s — AB (ref 11.4–15.2)

## 2017-12-15 MED ORDER — VANCOMYCIN HCL IN DEXTROSE 750-5 MG/150ML-% IV SOLN
750.0000 mg | Freq: Two times a day (BID) | INTRAVENOUS | Status: DC
  Administered 2017-12-16 – 2017-12-17 (×4): 750 mg via INTRAVENOUS
  Filled 2017-12-15 (×9): qty 150

## 2017-12-15 MED ORDER — VANCOMYCIN HCL 10 G IV SOLR
2000.0000 mg | Freq: Once | INTRAVENOUS | Status: AC
  Administered 2017-12-15: 2000 mg via INTRAVENOUS
  Filled 2017-12-15 (×2): qty 2000

## 2017-12-15 NOTE — Progress Notes (Signed)
ANTICOAGULATION CONSULT NOTE - Initial Consult  Pharmacy Consult for warfarin Indication: atrial fibrillation  Allergies  Allergen Reactions  . Erythromycin Other (See Comments)  . Penicillins Hives and Other (See Comments)    Has patient had a PCN reaction causing immediate rash, facial/tongue/throat swelling, SOB or lightheadedness with hypotension: No Has patient had a PCN reaction causing severe rash involving mucus membranes or skin necrosis: No Has patient had a PCN reaction that required hospitalization No Has patient had a PCN reaction occurring within the last 10 years: No If all of the above answers are "NO", then may proceed with Cephalosporin use.  Has tolerated Rocephin    Patient Measurements: Height: 6' (182.9 cm) Weight: 257 lb 3.2 oz (116.7 kg) IBW/kg (Calculated) : 77.6   Vital Signs: Temp: 98.4 F (36.9 C) (09/11 0623) Temp Source: Oral (09/11 0623) BP: 161/65 (09/11 0623) Pulse Rate: 62 (09/11 0623)  Labs: Recent Labs    12/13/17 1431 12/13/17 2157 12/14/17 0527 12/15/17 0520  HGB 13.6  --  12.6*  --   HCT 40.9  --  39.0  --   PLT 301  --  243  --   LABPROT 41.9* 40.8*  --  32.2*  INR 4.43* 4.28*  --  3.16  CREATININE 0.89  --  0.86  --     Estimated Creatinine Clearance: 88.8 mL/min (by C-G formula based on SCr of 0.86 mg/dL).   Medical History: Past Medical History:  Diagnosis Date  . Atrial fibrillation ([place])   . Chronic lymphocytic leukemia ([place])   . Coronary atherosclerosis of native coronary artery    Nonobstructive  . Essential hypertension, benign   . Hyperlipidemia   . IgA nephropathy    Dr. Lowanda Foster  . Type 2 diabetes mellitus ([place])   . UTI (urinary tract infection) july  10 th   taking  med     Medications:  Medications Prior to Admission  Medication Sig Dispense Refill Last Dose  . Ascorbic Acid (VITAMIN C) 1000 MG tablet Take 1,000 mg by mouth daily with breakfast.    12/13/2017 at Unknown time  . atorvastatin  (LIPITOR) 10 MG tablet Take 10 mg by mouth every evening.   12/12/2017 at Unknown time  . Calcium Carb-Cholecalciferol (CALCIUM 600+D) 600-800 MG-UNIT TABS Take 2 tablets by mouth daily with breakfast.   12/13/2017 at Unknown time  . cholecalciferol (VITAMIN D) 1000 units tablet Take 1,000 Units by mouth every morning.   12/13/2017 at Unknown time  . ciclopirox (LOPROX) 0.77 % cream Apply 1 application topically every morning. Applied to feet in the morning   12/13/2017 at Unknown time  . Coenzyme Q10 (CO Q-10) 100 MG CAPS Take 100 mg by mouth daily with breakfast.    12/13/2017 at Unknown time  . digoxin (LANOXIN) 0.125 MG tablet Take 0.125 mg by mouth daily with breakfast.    12/13/2017 at Unknown time  . finasteride (PROSCAR) 5 MG tablet Take 5 mg by mouth every morning.    12/13/2017 at Unknown time  . furosemide (LASIX) 20 MG tablet Take 2 pills a day (Patient taking differently: Take 20 mg by mouth daily. ) 20 tablet 0 12/13/2017 at Unknown time  . glimepiride (AMARYL) 2 MG tablet Take 2 mg by mouth daily with breakfast.    12/13/2017 at Unknown time  . insulin aspart (NOVOLOG FLEXPEN) 100 UNIT/ML FlexPen Inject 4-18 Units into the skin 2 (two) times daily. Pt uses per sliding scale before lunch and dinner.   12/13/2017  at Unknown time  . Insulin Detemir (LEVEMIR FLEXPEN) 100 UNIT/ML Pen Inject 60 Units into the skin every morning. 15 mL 0 12/13/2017 at Unknown time  . lisinopril (PRINIVIL,ZESTRIL) 20 MG tablet Take 40 mg by mouth daily with supper.   12/12/2017 at Unknown time  . LORazepam (ATIVAN) 1 MG tablet Take 2 tablets by mouth at bedtime.    12/12/2017 at Unknown time  . Multiple Vitamin (MULTIVITAMIN WITH MINERALS) TABS tablet Take 1 tablet by mouth daily with breakfast.   12/13/2017 at Unknown time  . Omega-3 Fatty Acids (FISH OIL) 1200 MG CAPS Take 2 capsules by mouth 2 (two) times daily.   12/13/2017 at Unknown time  . pioglitazone (ACTOS) 15 MG tablet Take 15 mg by mouth every evening.    12/12/2017 at Unknown  time  . VICTOZA 18 MG/3ML SOPN Inject 1.8 mg into the skin daily with breakfast.   12/13/2017 at Unknown time  . vitamin E 400 UNIT capsule Take 400 Units by mouth every morning.   12/13/2017 at Unknown time  . warfarin (COUMADIN) 7.5 MG tablet Take 5-7.5 mg by mouth See admin instructions. 5mg  on Tuesdays and Fridays. Takes 7.5mg  on all other days   12/12/2017 at 18-1900    Assessment: Pharmacy consulted to dose warfarin for patient with atrial fibrillation. Patient's INR  is supratherapeutic at 3.16  Goal of Therapy:  INR 2-3 Monitor platelets by anticoagulation protocol: Yes   Plan:  Hold warfarin x 1 dose Monitor daily INR and s/s of bleeding  Margot Ables, PharmD Clinical Pharmacist 12/15/2017 8:56 AM

## 2017-12-15 NOTE — Progress Notes (Signed)
PROGRESS NOTE  [name] [name]:[id number] DOB: 01/08/1935 DOA: 12/13/2017 PCP: [name], MD  Brief History:  83 y.o.malewith hx of DM2, UTI, IgA neph, HTN, afib , CLL and CAD direct admit from Dr Deeann Saint ENT office for mastoiditis/ OM w/ facial nerve palsy failing outpt abx.  Per pt's wife the recent history as follows:  09/28/17- has cyst removal from the R ear 10/04/17 - returned for removal of the packing 10/16/17 - throbbing pain in R ear, went to ED , CT scan done, went homewith clindamycin 10/17/17 - returned to ED w/ R facial droop, "Bell's palsy" diagnosed and started on prednisone taper 11/04/17 - seen in Dr Deeann Saint office today, not getting better on oral Rx , plan from hosp admit for IV abx , let coumadin wear off and do surgery (mastoidectomy) next week(11/10/17) 11/10/17 - Tympanomastoidectomy 12/06/17 - increase pain and drainage from right ear  Assessment/Plan: Acute mastoiditis/right otitis media -Failed outpatient antibiotics--outpatient clindamycin -with recurrent infection, 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 -continue ceftriaxone -d/c metronidazole -add vancomycin  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 -finished valtrex and prednisone as outpt  chronicatrial fibrillation -Continue coumadin -continue digoxin  Diabetes mellitus type 2, uncontrolled with hyperglycemia -continue levemir 60 units -change to intermediate SSI -continue ISS -11/07/17--check A1C--10.2 -holding actos  Essential hypertension -Continue lisinopril -restartedfurosemide  Chronic diastolic CHF -restart lasix -daily weights  Nonobstructive CAD -no anginal symptoms    Disposition Plan:   Home in 1-2 days  Family Communication:  Spouse updated at bedside 9/11--Total time spent 35 minutes.  Greater than 50% spent face to face counseling and coordinating care.  Consultants:  ENT by phone--[place]  Code Status:  FULL   DVT Prophylaxis:   coumadin   Procedures: As Listed in Progress Note Above  Antibiotics: Ceftriaxone 9/9>>> Metronidazole 9/9>> vanco 9/11>>>    Subjective: Patient continues to complain of pain in his right ear, but it is slowly improving.  He states that the drainage is improving.  Drainage remains mild to moderate.  He denies any nausea, vomiting, diarrhea, chest pain shortness breath.  He still has some pain with chewing on the right jaw.  Objective: Vitals:   12/14/17 2122 12/15/17 0623 12/15/17 1036 12/15/17 1424  BP: (!) 146/76 (!) 161/65  (!) 152/66  Pulse: 70 62  72  Resp: 20 17  18   Temp: 98.3 F (36.8 C) 98.4 F (36.9 C)  (!) 97.5 F (36.4 C)  TempSrc: Oral Oral  Oral  SpO2: 96% 98% 95% 95%  Weight:  116.7 kg    Height:        Intake/Output Summary (Last 24 hours) at 12/15/2017 1738 Last data filed at 12/15/2017 1415 Gross per 24 hour  Intake 1040.9 ml  Output 650 ml  Net 390.9 ml   Weight change: -2.631 kg Exam:   General:  Pt is alert, follows commands appropriately, not in acute distress  HEENT: No icterus, No thrush, No neck mass, [place]/AT  Cardiovascular: IRRR, S1/S2, no rubs, no gallops  Respiratory: CTA bilaterally, no wheezing, no crackles, no rhonchi  Abdomen: Soft/+BS, non tender, non distended, no guarding  Extremities: No edema, No lymphangitis, No petechiae, No rashes, no synovitis   Data Reviewed: I have personally reviewed following labs and imaging studies Basic Metabolic Panel: Recent Labs  Lab 12/13/17 1431 12/14/17 0527  NA 136 138  K 4.1 4.5  CL 99 100  CO2 29 30  GLUCOSE 196* 196*  BUN  24* 23  CREATININE 0.89 0.86  CALCIUM 10.6* 9.8   Liver Function Tests: Recent Labs  Lab 12/14/17 0527  AST 19  ALT 17  ALKPHOS 98  BILITOT 0.8  PROT 6.2*  ALBUMIN 3.1*   No results for input(s): LIPASE, AMYLASE in the last 168 hours. No results for input(s): AMMONIA in the last 168 hours. Coagulation Profile: Recent Labs  Lab 12/13/17 1431  12/13/17 2157 12/15/17 0520  INR 4.43* 4.28* 3.16   CBC: Recent Labs  Lab 12/13/17 1431 12/14/17 0527 12/15/17 0921  WBC 19.5* 15.5* 14.1*  NEUTROABS 7.4  --   --   HGB 13.6 12.6* 12.8*  HCT 40.9 39.0 38.9*  MCV 87.0 87.8 87.8  PLT 301 243 218   Cardiac Enzymes: No results for input(s): CKTOTAL, CKMB, CKMBINDEX, TROPONINI in the last 168 hours. BNP: Invalid input(s): POCBNP CBG: Recent Labs  Lab 12/14/17 1611 12/14/17 2119 12/15/17 0741 12/15/17 1212 12/15/17 1551  GLUCAP 161* 174* 167* 194* 225*   HbA1C: Recent Labs    12/14/17 0527  HGBA1C 9.5*   Urine analysis:    Component Value Date/Time   COLORURINE YELLOW 12/14/2017 0050   APPEARANCEUR CLEAR 12/14/2017 0050   LABSPEC 1.008 12/14/2017 0050   PHURINE 6.0 12/14/2017 0050   GLUCOSEU NEGATIVE 12/14/2017 0050   HGBUR NEGATIVE 12/14/2017 0050   BILIRUBINUR NEGATIVE 12/14/2017 0050   KETONESUR NEGATIVE 12/14/2017 0050   PROTEINUR NEGATIVE 12/14/2017 0050   NITRITE NEGATIVE 12/14/2017 0050   LEUKOCYTESUR NEGATIVE 12/14/2017 0050   Sepsis Labs: @LABRCNTIP (procalcitonin:4,lacticidven:4) )No results found for this or any previous visit (from the past 240 hour(s)).   Scheduled Meds: . atorvastatin  10 mg Oral QPM  . calcium-vitamin D  2 tablet Oral Q breakfast  . cholecalciferol  1,000 Units Oral q morning - 10a  . digoxin  0.125 mg Oral Q breakfast  . feeding supplement (GLUCERNA SHAKE)  237 mL Oral BID BM  . finasteride  5 mg Oral q morning - 10a  . furosemide  20 mg Oral Daily  . insulin aspart  0-5 Units Subcutaneous QHS  . insulin aspart  0-9 Units Subcutaneous TID WC  . insulin aspart  3 Units Subcutaneous TID WC  . insulin detemir  60 Units Subcutaneous q morning - 10a  . lisinopril  40 mg Oral Q supper  . multivitamin with minerals  1 tablet Oral Q breakfast  . sodium chloride flush  3 mL Intravenous Q12H  . vitamin C  1,000 mg Oral Q breakfast   Continuous Infusions: . sodium chloride 10  mL (12/15/17 1726)  . cefTRIAXone (ROCEPHIN)  IV Stopped (12/14/17 1829)  . metronidazole 500 mg (12/15/17 1729)    Procedures/Studies: No results found.  Orson Eva, DO  [facility] Pager [phone]  If 7PM-7AM, please contact night-coverage [email or web address] Password [email or web address] 12/15/2017, 5:38 PM   LOS: 2 days

## 2017-12-15 NOTE — Care Management Important Message (Signed)
Important Message  Patient Details  Name: [name] [name] MRN: [id number] Date of Birth: 10-Jan-1935   Medicare Important Message Given:  Yes    [name] 12/15/2017, 12:32 PM

## 2017-12-15 NOTE — Progress Notes (Signed)
Pharmacy Antibiotic Note  [name] [name] is a 83 y.o. male admitted on 12/13/2017 with mastoiditis.  Pharmacy has been consulted for vancomycin dosing.  Patient failed outpatient clindamycin and has been on metronidazole (now dc'd) and ceftriaxone.  Plan: Loading dose: vancomycin 2g IV x1 dose now Maintenance dose:  vancomycin 750mg  IV q12h starting at 1000 on 12/16/17 Goal trough range:  10-15  mcg/mL Pharmacy will continue to monitor renal function, vancomycin troughs, cultures and patient progress.    Height: 6' (182.9 cm) Weight: 257 lb 3.2 oz (116.7 kg) IBW/kg (Calculated) : 77.6  Temp (24hrs), Avg:98.1 F (36.7 C), Min:97.5 F (36.4 C), Max:98.4 F (36.9 C)  Recent Labs  Lab 12/13/17 1431 12/14/17 0527 12/15/17 0921  WBC 19.5* 15.5* 14.1*  CREATININE 0.89 0.86  --     Estimated Creatinine Clearance: 88.8 mL/min (by C-G formula based on SCr of 0.86 mg/dL).    Allergies  Allergen Reactions  . Erythromycin Other (See Comments)  . Penicillins Hives and Other (See Comments)    Has patient had a PCN reaction causing immediate rash, facial/tongue/throat swelling, SOB or lightheadedness with hypotension: No Has patient had a PCN reaction causing severe rash involving mucus membranes or skin necrosis: No Has patient had a PCN reaction that required hospitalization No Has patient had a PCN reaction occurring within the last 10 years: No If all of the above answers are "NO", then may proceed with Cephalosporin use.  Has tolerated Rocephin    Antimicrobials this admission: Metronidazole 9/10 >> 9/11 Ceftriaxone 9/10>> Vancomycin 9/11 >>   Microbiology results: none ordered at this time       BCx:         UCx:         Sputum:         MRSA PCR:    Thank you for allowing pharmacy to be a part of this patient's care.  Despina Pole 12/15/2017 6:32 PM

## 2017-12-16 ENCOUNTER — Inpatient Hospital Stay (HOSPITAL_COMMUNITY): Payer: Medicare Other

## 2017-12-16 LAB — BASIC METABOLIC PANEL
Anion gap: 9 (ref 5–15)
BUN: 20 mg/dL (ref 8–23)
CALCIUM: 9.2 mg/dL (ref 8.9–10.3)
CO2: 29 mmol/L (ref 22–32)
CREATININE: 0.8 mg/dL (ref 0.61–1.24)
Chloride: 100 mmol/L (ref 98–111)
GFR calc Af Amer: >60 mL/min (ref >60)
GFR calc non Af Amer: >60 mL/min (ref >60)
Glucose, Bld: 248 mg/dL — ABNORMAL HIGH (ref 70–99)
Potassium: 4.3 mmol/L (ref 3.5–5.1)
Sodium: 138 mmol/L (ref 135–145)

## 2017-12-16 LAB — CBC
HCT: 38.9 % — ABNORMAL LOW (ref 39.0–52.0)
Hemoglobin: 12.8 g/dL — ABNORMAL LOW (ref 13.0–17.0)
MCH: 29.2 pg (ref 26.0–34.0)
MCHC: 32.9 g/dL (ref 30.0–36.0)
MCV: 88.6 fL (ref 78.0–100.0)
PLATELETS: 246 10*3/uL (ref 150–400)
RBC: 4.39 MIL/uL (ref 4.22–5.81)
RDW: 14.8 % (ref 11.5–15.5)
WBC: 14.3 10*3/uL — AB (ref 4.0–10.5)

## 2017-12-16 LAB — GLUCOSE, CAPILLARY
GLUCOSE-CAPILLARY: 271 mg/dL — AB (ref 70–99)
GLUCOSE-CAPILLARY: 159 mg/dL — AB (ref 70–99)
Glucose-Capillary: 219 mg/dL — ABNORMAL HIGH (ref 70–99)
Glucose-Capillary: 135 mg/dL — ABNORMAL HIGH (ref 70–99)

## 2017-12-16 LAB — C-REACTIVE PROTEIN: CRP: 5.8 mg/dL — ABNORMAL HIGH (ref <1.0)

## 2017-12-16 LAB — SEDIMENTATION RATE: Sed Rate: 60 mm/hr — ABNORMAL HIGH (ref 0–16)

## 2017-12-16 LAB — PROTIME-INR
INR: 1.93
Prothrombin Time: 21.9 seconds — ABNORMAL HIGH (ref 11.4–15.2)

## 2017-12-16 MED ORDER — INSULIN ASPART 100 UNIT/ML ~~LOC~~ SOLN: 0.0000 [IU] | Freq: Every day | SUBCUTANEOUS | Status: DC

## 2017-12-16 MED ORDER — INSULIN ASPART 100 UNIT/ML ~~LOC~~ SOLN
6.0000 [IU] | Freq: Three times a day (TID) | SUBCUTANEOUS | Status: DC
  Administered 2017-12-16 – 2017-12-18 (×6): 6 [IU] via SUBCUTANEOUS

## 2017-12-16 MED ORDER — INSULIN DETEMIR 100 UNIT/ML ~~LOC~~ SOLN
65.0000 [IU] | Freq: Every morning | SUBCUTANEOUS | Status: DC
  Administered 2017-12-17 – 2017-12-18 (×2): 65 [IU] via SUBCUTANEOUS
  Filled 2017-12-16 (×4): qty 0.65

## 2017-12-16 MED ORDER — SODIUM CHLORIDE 0.9% FLUSH
10.0000 mL | Freq: Two times a day (BID) | INTRAVENOUS | Status: DC
  Administered 2017-12-16 – 2017-12-18 (×3): 10 mL

## 2017-12-16 MED ORDER — INSULIN ASPART 100 UNIT/ML ~~LOC~~ SOLN
0.0000 [IU] | Freq: Three times a day (TID) | SUBCUTANEOUS | Status: DC
  Administered 2017-12-16: 8 [IU] via SUBCUTANEOUS
  Administered 2017-12-16: 2 [IU] via SUBCUTANEOUS
  Administered 2017-12-17: 5 [IU] via SUBCUTANEOUS
  Administered 2017-12-17: 8 [IU] via SUBCUTANEOUS
  Administered 2017-12-17 – 2017-12-18 (×3): 5 [IU] via SUBCUTANEOUS

## 2017-12-16 MED ORDER — SODIUM CHLORIDE 0.9% FLUSH: 10.0000 mL | INTRAVENOUS | Status: DC | PRN

## 2017-12-16 MED ORDER — WARFARIN - PHARMACIST DOSING INPATIENT
Freq: Every day | Status: DC
  Administered 2017-12-16 – 2017-12-17 (×2)

## 2017-12-16 MED ORDER — WARFARIN SODIUM 7.5 MG PO TABS
7.5000 mg | ORAL_TABLET | Freq: Once | ORAL | Status: AC
  Administered 2017-12-16: 7.5 mg via ORAL
  Filled 2017-12-16: qty 1

## 2017-12-16 NOTE — Progress Notes (Signed)
Peripherally Inserted Central Catheter/Midline Placement  The IV Nurse has discussed with the patient and/or persons authorized to consent for the patient, the purpose of this procedure and the potential benefits and risks involved with this procedure.  The benefits include less needle sticks, lab draws from the catheter, and the patient may be discharged home with the catheter. Risks include, but not limited to, infection, bleeding, blood clot (thrombus formation), and puncture of an artery; nerve damage and irregular heartbeat and possibility to perform a PICC exchange if needed/ordered by physician.  Alternatives to this procedure were also discussed.  Bard Power PICC patient education guide, fact sheet on infection prevention and patient information card has been provided to patient /or left at bedside.    PICC/Midline Placement Documentation        [name] [name] 12/16/2017, 12:10 PM

## 2017-12-16 NOTE — Clinical Social Work Note (Signed)
Clinical Social Work Assessment  Patient Details  Name: [name] [name] MRN: [id number] Date of Birth: 1935-02-23  Date of referral:  12/16/17               Reason for consult:  Facility Placement, Discharge Planning                Permission sought to share information with:  Facility Art therapist granted to share information::  Yes, Verbal Permission Granted  Name::        Agency::  [facility]  Relationship::     Contact Information:     Housing/Transportation Living arrangements for the past 2 months:  [place] of Information:  Patient, Spouse Patient Interpreter Needed:  None Criminal Activity/Legal Involvement Pertinent to Current Situation/Hospitalization:  No - Comment as needed Significant Relationships:  Spouse Lives with:  Spouse Do you feel safe going back to the place where you live?  Yes Need for family participation in patient care:  No (Coment)  Care giving concerns: Pt needs IV antibiotics at dc.   Social Worker assessment / plan: Pt is an 83 year old male referred to CSW for SNF at dc. Met with pt and his wife at bedside. Pt states he has been at [facility] in the past and would like referral there. Will refer and follow for dc planning. MD indicates potential dc tomorrow.  Employment status:  Retired Forensic scientist:  Medicare PT Recommendations:  Not assessed at this time Information / Referral to community resources:  [place]  Patient/Family's Response to care: Pt accepting of care.  Patient/Family's Understanding of and Emotional Response to Diagnosis, Current Treatment, and Prognosis: Pt appears to have a good understanding of diagnosis and treatment recommendations. No emotional distress identified.  Emotional Assessment Appearance:  Appears stated age Attitude/Demeanor/Rapport:  Engaged Affect (typically observed):  Calm, Pleasant Orientation:  Oriented to Self, Oriented to Place, Oriented to  Situation, Oriented to  Time Alcohol / Substance use:  Not Applicable Psych involvement (Current and /or in the community):  No (Comment)  Discharge Needs  Concerns to be addressed:  Discharge Planning Concerns Readmission within the last 30 days:  No Current discharge risk:  None Barriers to Discharge:  No Barriers Identified   Shade Flood, LCSW 12/16/2017, 11:42 AM

## 2017-12-16 NOTE — Progress Notes (Signed)
PROGRESS NOTE  [name] [name] HWE:[id number] DOB: May 23, 1934 DOA: 12/13/2017 PCP: [name], MD  Brief History:  83 y.o.malewith hx of DM2, UTI, IgA neph, HTN, afib , CLL and CAD direct admit from Dr Deeann Saint ENT office for mastoiditis/ OM w/ facial nerve palsy failing outpt abx.  Per pt's wife the recent history as follows:  09/28/17- has cyst removal from the R ear 10/04/17 - returned for removal of the packing 10/16/17 - throbbing pain in R ear, went to ED , CT scan done, went homewith clindamycin 10/17/17 - returned to ED w/ R facial droop, "Bell's palsy" diagnosed and started on prednisone taper 11/04/17 - seen in Dr Deeann Saint office today, not getting better on oral Rx , plan from hosp admit for IV abx , let coumadin wear off and do surgery (mastoidectomy) next week(11/10/17) 11/10/17 - Tympanomastoidectomy 12/06/17 - increase pain and drainage from right ear 12/13/17 - office f/u with Dr. 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 -place PICC line -continue ceftriaxone -d/c metronidazole -continue vancomycin -plan IV abx through 01/23/18  Virl Axe Syndrome -finished valtrex and prednisone as outpt  chronicatrial fibrillation -Continue coumadin -continue digoxin  Diabetes mellitus type 2, uncontrolled with hyperglycemia -increase levemir 65 units -change to intermediate SSI -continue ISS -12/14/17--check A1C--9.5 -holding actos -increase pre-meal novolog to 6 units tiw  Essential hypertension -Continue lisinopril -restartedfurosemide  Chronic diastolic CHF -restart lasix -daily weights  Nonobstructive CAD -no anginal symptoms    Disposition Plan:   SNF 9/13 if stable Family Communication:  Spouse updated at bedside 9/12  Consultants:   ENT by phone--Teoh  Code Status:  FULL   DVT Prophylaxis:  coumadin   Procedures: As Listed in Progress Note Above  Antibiotics: Ceftriaxone 9/9>>> Metronidazole 9/9>> vanco 9/11>>>    Subjective: Pt states that pain and drainage in right ear is slowly improving.  Denies f/c cp, sob, n/v/d, abd pain, dysuria.  Pain is still mild to moderated and constant.  Objective: Vitals:   12/15/17 0623 12/15/17 1036 12/15/17 1424 12/15/17 2206  BP: (!) 161/65  (!) 152/66 (!) 155/63  Pulse: 62  72 63  Resp: 17  18 19   Temp: 98.4 F (36.9 C)  (!) 97.5 F (36.4 C) 98.2 F (36.8 C)  TempSrc: Oral  Oral Oral  SpO2: 98% 95% 95% 98%  Weight: 116.7 kg   117.2 kg  Height:        Intake/Output Summary (Last 24 hours) at 12/16/2017 1050 Last data filed at 12/16/2017 0617 Gross per 24 hour  Intake 1231.72 ml  Output -  Net 1231.72 ml   Weight change: 0.544 kg Exam:   General:  Pt is alert, follows commands appropriately, not in acute distress  HEENT: No icterus, No thrush, No neck mass, [place]/AT  Cardiovascular: RRR, S1/S2, no rubs, no gallops  Respiratory: CTA bilaterally, no wheezing, no crackles, no rhonchi  Abdomen: Soft/+BS, non tender, non distended, no guarding  Extremities: No edema, No lymphangitis, No petechiae, No rashes, no synovitis   Data Reviewed: I have personally reviewed following labs and imaging studies Basic Metabolic Panel: Recent Labs  Lab 12/13/17 1431 12/14/17 0527 12/16/17 0517  NA 136 138 138  K 4.1 4.5 4.3  CL 99 100 100  CO2 29 30 29   GLUCOSE 196* 196* 248*  BUN 24* 23 20  CREATININE  0.89 0.86 0.80  CALCIUM 10.6* 9.8 9.2   Liver Function Tests: Recent Labs  Lab 12/14/17 0527  AST 19  ALT 17  ALKPHOS 98  BILITOT 0.8  PROT 6.2*  ALBUMIN 3.1*   No results for input(s): LIPASE, AMYLASE in the last 168 hours. No results for input(s): AMMONIA in the last 168 hours. Coagulation Profile: Recent Labs  Lab 12/13/17 1431  12/13/17 2157 12/15/17 0520 12/16/17 0517  INR 4.43* 4.28* 3.16 1.93   CBC: Recent Labs  Lab 12/13/17 1431 12/14/17 0527 12/15/17 0921 12/16/17 0517  WBC 19.5* 15.5* 14.1* 14.3*  NEUTROABS 7.4  --   --   --   HGB 13.6 12.6* 12.8* 12.8*  HCT 40.9 39.0 38.9* 38.9*  MCV 87.0 87.8 87.8 88.6  PLT 301 243 218 246   Cardiac Enzymes: No results for input(s): CKTOTAL, CKMB, CKMBINDEX, TROPONINI in the last 168 hours. BNP: Invalid input(s): POCBNP CBG: Recent Labs  Lab 12/15/17 0741 12/15/17 1212 12/15/17 1551 12/15/17 2041 12/16/17 0803  GLUCAP 167* 194* 225* 189* 219*   HbA1C: Recent Labs    12/14/17 0527  HGBA1C 9.5*   Urine analysis:    Component Value Date/Time   COLORURINE YELLOW 12/14/2017 0050   APPEARANCEUR CLEAR 12/14/2017 0050   LABSPEC 1.008 12/14/2017 0050   PHURINE 6.0 12/14/2017 0050   GLUCOSEU NEGATIVE 12/14/2017 0050   HGBUR NEGATIVE 12/14/2017 0050   BILIRUBINUR NEGATIVE 12/14/2017 0050   KETONESUR NEGATIVE 12/14/2017 0050   PROTEINUR NEGATIVE 12/14/2017 0050   NITRITE NEGATIVE 12/14/2017 0050   LEUKOCYTESUR NEGATIVE 12/14/2017 0050   Sepsis Labs: @LABRCNTIP (procalcitonin:4,lacticidven:4) )No results found for this or any previous visit (from the past 240 hour(s)).   Scheduled Meds: . atorvastatin  10 mg Oral QPM  . calcium-vitamin D  2 tablet Oral Q breakfast  . cholecalciferol  1,000 Units Oral q morning - 10a  . digoxin  0.125 mg Oral Q breakfast  . feeding supplement (GLUCERNA SHAKE)  237 mL Oral BID BM  . finasteride  5 mg Oral q morning - 10a  . furosemide  20 mg Oral Daily  . insulin aspart  0-5 Units Subcutaneous QHS  . insulin aspart  0-9 Units Subcutaneous TID WC  . insulin aspart  3 Units Subcutaneous TID WC  . insulin detemir  60 Units Subcutaneous q morning - 10a  . lisinopril  40 mg Oral Q supper  . multivitamin with minerals  1 tablet Oral Q breakfast  . sodium chloride flush  3 mL Intravenous Q12H  . vitamin C  1,000 mg  Oral Q breakfast  . warfarin  7.5 mg Oral Once  . Warfarin - Pharmacist Dosing Inpatient   Does not apply q1800   Continuous Infusions: . sodium chloride Stopped (12/16/17 0119)  . cefTRIAXone (ROCEPHIN)  IV Stopped (12/15/17 1931)  . vancomycin 750 mg (12/16/17 1038)    Procedures/Studies: No results found.  Orson Eva, DO  [facility] Pager [phone]  If 7PM-7AM, please contact night-coverage [email or web address] Password [email or web address] 12/16/2017, 10:50 AM   LOS: 3 days

## 2017-12-16 NOTE — NC FL2 (Signed)
[place] LEVEL OF CARE SCREENING TOOL     IDENTIFICATION  Patient Name: [name] [name] Birthdate: 04-24-1934 Sex: male Admission Date (Current Location): 12/13/2017  [facility] and [place] Number:  [employer] and Address:  [place] [address] [place]      Provider Number: [id number]  Attending Physician Name and Address:  Orson Eva, MD  Relative Name and Phone Number:  Chief Walkup (wife) [phone]    Current Level of Care: Hospital Recommended Level of Care: [place] Prior Approval Number: [id number]  Date Approved/Denied: 09/10/16 PASRR Number:    Discharge Plan: SNF    Current Diagnoses: Patient Active Problem List   Diagnosis Date Noted  . Hemorrhagic otitis externa of right ear 12/13/2017  . Hemorrhagic otitis externa 12/13/2017  . Uncontrolled type 2 diabetes mellitus with hyperglycemia, with long-term current use of insulin ([place]) 11/06/2017  . Acute mastoiditis 11/05/2017  . Atrial fibrillation, chronic ([place]) 11/05/2017  . Chronic diastolic CHF (congestive heart failure) ([place]) 11/05/2017  . Mastoiditis 11/04/2017  . Disorder of right facial nerve 11/04/2017  . Facial nerve palsy 11/04/2017  . Essential hypertension 09/10/2016  . Rt Heel Osteomyelitis ([place]) 09/08/2016  . Cellulitis of heel, right 09/08/2016  . Hypoglycemia 06/21/2016  . Type 2 diabetes mellitus ([place])   . Chronic lymphocytic leukemia ([place])   . Encounter for therapeutic drug monitoring 05/16/2013  . Cardiac murmur 10/21/2011  . Long term current use of anticoagulant therapy 06/27/2010  . Mixed hyperlipidemia 05/21/2008  . CORONARY ATHEROSCLEROSIS NATIVE CORONARY ARTERY 05/21/2008  . Atrial fibrillation ([place]) 05/21/2008  . KNEE PAIN 06/14/2007    Orientation RESPIRATION BLADDER Height & Weight     Self, Time, Situation, Place  Normal Continent Weight: 258 lb 6.4 oz (117.2 kg) Height:  6' (182.9 cm)  BEHAVIORAL  SYMPTOMS/MOOD NEUROLOGICAL BOWEL NUTRITION STATUS      Continent Diet(see dc summary)  AMBULATORY STATUS COMMUNICATION OF NEEDS Skin   Supervision Verbally Normal                       Personal Care Assistance Level of Assistance  Bathing, Feeding, Dressing Bathing Assistance: Limited assistance Feeding assistance: Independent Dressing Assistance: Limited assistance     Functional Limitations Info  Sight, Hearing, Speech Sight Info: Adequate Hearing Info: Adequate Speech Info: Adequate    SPECIAL CARE FACTORS FREQUENCY                       Contractures Contractures Info: Not present    Additional Factors Info  Code Status, Allergies, Psychotropic Code Status Info: full Allergies Info: Erythromycin, Penicillins Psychotropic Info: Ativan         Current Medications (12/16/2017):  This is the current hospital active medication list Current Facility-Administered Medications  Medication Dose Route Frequency Provider Last Rate Last Dose  . 0.9 %  sodium chloride infusion  250 mL Intravenous PRN Roney Jaffe, MD   Stopped at 12/16/17 0119  . acetaminophen (TYLENOL) tablet 650 mg  650 mg Oral Q6H PRN Roney Jaffe, MD   650 mg at 12/16/17 0202   Or  . acetaminophen (TYLENOL) suppository 650 mg  650 mg Rectal Q6H PRN Roney Jaffe, MD      . atorvastatin (LIPITOR) tablet 10 mg  10 mg Oral QPM Roney Jaffe, MD   10 mg at 12/15/17 1725  . calcium-vitamin D (OSCAL WITH D) 500-200 MG-UNIT per tablet 2 tablet  2 tablet Oral Q breakfast Roney Jaffe, MD   2 tablet at 12/16/17 0839  . cefTRIAXone (ROCEPHIN) 2 g in sodium chloride 0.9 % 100 mL IVPB  2 g Intravenous Q24H Roney Jaffe, MD   Stopped at 12/15/17 1931  . cholecalciferol (VITAMIN D) tablet 1,000 Units  1,000 Units Oral q morning - 10a Roney Jaffe, MD   1,000 Units at 12/16/17 1038  . digoxin (LANOXIN) tablet 0.125 mg  0.125 mg Oral Q breakfast Roney Jaffe, MD   0.125 mg at 12/16/17 0839  .  feeding supplement (GLUCERNA SHAKE) (GLUCERNA SHAKE) liquid 237 mL  237 mL Oral BID BM Charlynne Cousins, MD   237 mL at 12/14/17 1956  . finasteride (PROSCAR) tablet 5 mg  5 mg Oral q morning - 10a Roney Jaffe, MD   5 mg at 12/16/17 1038  . furosemide (LASIX) tablet 20 mg  20 mg Oral Daily Roney Jaffe, MD   20 mg at 12/16/17 1038  . insulin aspart (novoLOG) injection 0-15 Units  0-15 Units Subcutaneous TID WC Tat, David, MD      . insulin aspart (novoLOG) injection 0-5 Units  0-5 Units Subcutaneous QHS Tat, David, MD      . insulin aspart (novoLOG) injection 6 Units  6 Units Subcutaneous TID WC Tat, Shanon Brow, MD      . Derrill Memo ON 12/17/2017] insulin detemir (LEVEMIR) injection 65 Units  65 Units Subcutaneous q morning - 10a Tat, David, MD      . lisinopril (PRINIVIL,ZESTRIL) tablet 40 mg  40 mg Oral Q supper Roney Jaffe, MD   40 mg at 12/15/17 1725  . LORazepam (ATIVAN) tablet 1 mg  1 mg Oral QHS PRN Roney Jaffe, MD   1 mg at 12/13/17 2237  . multivitamin with minerals tablet 1 tablet  1 tablet Oral Q breakfast Roney Jaffe, MD   1 tablet at 12/16/17 0839  . ondansetron (ZOFRAN) tablet 4 mg  4 mg Oral Q6H PRN Roney Jaffe, MD       Or  . ondansetron [facility]) injection 4 mg  4 mg Intravenous Q6H PRN Roney Jaffe, MD      . polyethylene glycol (MIRALAX / GLYCOLAX) packet 17 g  17 g Oral Daily PRN Roney Jaffe, MD      . sodium chloride flush (NS) 0.9 % injection 3 mL  3 mL Intravenous Q12H Roney Jaffe, MD   3 mL at 12/15/17 1059  . sodium chloride flush (NS) 0.9 % injection 3 mL  3 mL Intravenous PRN Roney Jaffe, MD      . traMADol Veatrice Bourbon) tablet 50 mg  50 mg Oral Q6H PRN Roney Jaffe, MD   50 mg at 12/15/17 0139  . vancomycin (VANCOCIN) IVPB 750 mg/150 ml premix  750 mg Intravenous Therisa Doyne, MD 150 mL/hr at 12/16/17 1038 750 mg at 12/16/17 1038  . vitamin C (ASCORBIC ACID) tablet 1,000 mg  1,000 mg Oral Q breakfast Roney Jaffe, MD   1,000 mg at  12/16/17 0839  . warfarin (COUMADIN) tablet 7.5 mg  7.5 mg Oral Once Tat, Shanon Brow, MD      . Warfarin - Pharmacist Dosing Inpatient   Does not apply q1800 Tat, Shanon Brow, MD         Discharge Medications: Please see discharge summary for a list of discharge medications.  Relevant Imaging Results:  Relevant Lab Results:   Additional Information SSN: [place], [place]

## 2017-12-16 NOTE — Progress Notes (Signed)
[place] for warfarin Indication: atrial fibrillation  Allergies  Allergen Reactions  . Erythromycin Other (See Comments)  . Penicillins Hives and Other (See Comments)    Has patient had a PCN reaction causing immediate rash, facial/tongue/throat swelling, SOB or lightheadedness with hypotension: No Has patient had a PCN reaction causing severe rash involving mucus membranes or skin necrosis: No Has patient had a PCN reaction that required hospitalization No Has patient had a PCN reaction occurring within the last 10 years: No If all of the above answers are "NO", then may proceed with Cephalosporin use.  Has tolerated Rocephin    Patient Measurements: Height: 6' (182.9 cm) Weight: 258 lb 6.4 oz (117.2 kg) IBW/kg (Calculated) : 77.6   Vital Signs: Temp: 98.2 F (36.8 C) (09/11 2206) Temp Source: Oral (09/11 2206) BP: 155/63 (09/11 2206) Pulse Rate: 63 (09/11 2206)  Labs: Recent Labs    12/13/17 1431 12/13/17 2157 12/14/17 0527 12/15/17 0520 12/15/17 0921 12/16/17 0517  HGB 13.6  --  12.6*  --  12.8* 12.8*  HCT 40.9  --  39.0  --  38.9* 38.9*  PLT 301  --  243  --  218 246  LABPROT 41.9* 40.8*  --  32.2*  --  21.9*  INR 4.43* 4.28*  --  3.16  --  1.93  CREATININE 0.89  --  0.86  --   --  0.80    Estimated Creatinine Clearance: 95.7 mL/min (by C-G formula based on SCr of 0.8 mg/dL).   Medical History: Past Medical History:  Diagnosis Date  . Atrial fibrillation ([place])   . Chronic lymphocytic leukemia ([place])   . Coronary atherosclerosis of native coronary artery    Nonobstructive  . Essential hypertension, benign   . Hyperlipidemia   . IgA nephropathy    Dr. Lowanda Foster  . Type 2 diabetes mellitus ([place])   . UTI (urinary tract infection) july  10 th   taking  med     Medications:  Medications Prior to Admission  Medication Sig Dispense Refill Last Dose  . Ascorbic Acid (VITAMIN C) 1000 MG tablet Take 1,000 mg by mouth  daily with breakfast.    12/13/2017 at Unknown time  . atorvastatin (LIPITOR) 10 MG tablet Take 10 mg by mouth every evening.   12/12/2017 at Unknown time  . Calcium Carb-Cholecalciferol (CALCIUM 600+D) 600-800 MG-UNIT TABS Take 2 tablets by mouth daily with breakfast.   12/13/2017 at Unknown time  . cholecalciferol (VITAMIN D) 1000 units tablet Take 1,000 Units by mouth every morning.   12/13/2017 at Unknown time  . ciclopirox (LOPROX) 0.77 % cream Apply 1 application topically every morning. Applied to feet in the morning   12/13/2017 at Unknown time  . Coenzyme Q10 (CO Q-10) 100 MG CAPS Take 100 mg by mouth daily with breakfast.    12/13/2017 at Unknown time  . digoxin (LANOXIN) 0.125 MG tablet Take 0.125 mg by mouth daily with breakfast.    12/13/2017 at Unknown time  . finasteride (PROSCAR) 5 MG tablet Take 5 mg by mouth every morning.    12/13/2017 at Unknown time  . furosemide (LASIX) 20 MG tablet Take 2 pills a day (Patient taking differently: Take 20 mg by mouth daily. ) 20 tablet 0 12/13/2017 at Unknown time  . glimepiride (AMARYL) 2 MG tablet Take 2 mg by mouth daily with breakfast.    12/13/2017 at Unknown time  . insulin aspart (NOVOLOG FLEXPEN) 100 UNIT/ML FlexPen Inject 4-18  Units into the skin 2 (two) times daily. Pt uses per sliding scale before lunch and dinner.   12/13/2017 at Unknown time  . Insulin Detemir (LEVEMIR FLEXPEN) 100 UNIT/ML Pen Inject 60 Units into the skin every morning. 15 mL 0 12/13/2017 at Unknown time  . lisinopril (PRINIVIL,ZESTRIL) 20 MG tablet Take 40 mg by mouth daily with supper.   12/12/2017 at Unknown time  . LORazepam (ATIVAN) 1 MG tablet Take 2 tablets by mouth at bedtime.    12/12/2017 at Unknown time  . Multiple Vitamin (MULTIVITAMIN WITH MINERALS) TABS tablet Take 1 tablet by mouth daily with breakfast.   12/13/2017 at Unknown time  . Omega-3 Fatty Acids (FISH OIL) 1200 MG CAPS Take 2 capsules by mouth 2 (two) times daily.   12/13/2017 at Unknown time  . pioglitazone (ACTOS) 15 MG  tablet Take 15 mg by mouth every evening.    12/12/2017 at Unknown time  . VICTOZA 18 MG/3ML SOPN Inject 1.8 mg into the skin daily with breakfast.   12/13/2017 at Unknown time  . vitamin E 400 UNIT capsule Take 400 Units by mouth every morning.   12/13/2017 at Unknown time  . warfarin (COUMADIN) 7.5 MG tablet Take 5-7.5 mg by mouth See admin instructions. 5mg  on Tuesdays and Fridays. Takes 7.5mg  on all other days   12/12/2017 at 18-1900    Assessment: Pharmacy consulted to dose warfarin for patient with atrial fibrillation. Patient's INR today is 1.93. Patient takes 5 mg on Tues-Fri and 7.5 mg ROW  Goal of Therapy:  INR 2-3 Monitor platelets by anticoagulation protocol: Yes   Plan:  Warfarin 7.5 mg x 1 dose Monitor daily INR and s/s of bleeding  Margot Ables, PharmD Clinical Pharmacist 12/16/2017 9:07 AM

## 2017-12-17 DIAGNOSIS — H70009 Acute mastoiditis without complications, unspecified ear: Secondary | ICD-10-CM

## 2017-12-17 LAB — GLUCOSE, CAPILLARY
GLUCOSE-CAPILLARY: 186 mg/dL — AB (ref 70–99)
Glucose-Capillary: 217 mg/dL — ABNORMAL HIGH (ref 70–99)
Glucose-Capillary: 254 mg/dL — ABNORMAL HIGH (ref 70–99)

## 2017-12-17 LAB — BASIC METABOLIC PANEL
ANION GAP: 10 (ref 5–15)
BUN: 18 mg/dL (ref 8–23)
CO2: 28 mmol/L (ref 22–32)
Calcium: 9.1 mg/dL (ref 8.9–10.3)
Chloride: 100 mmol/L (ref 98–111)
Creatinine, Ser: 0.7 mg/dL (ref 0.61–1.24)
GFR calc Af Amer: >60 mL/min (ref >60)
GLUCOSE: 223 mg/dL — AB (ref 70–99)
POTASSIUM: 4 mmol/L (ref 3.5–5.1)
SODIUM: 138 mmol/L (ref 135–145)

## 2017-12-17 LAB — CBC
HCT: 39.8 % (ref 39.0–52.0)
Hemoglobin: 13.1 g/dL (ref 13.0–17.0)
MCH: 28.7 pg (ref 26.0–34.0)
MCHC: 32.9 g/dL (ref 30.0–36.0)
MCV: 87.3 fL (ref 78.0–100.0)
PLATELETS: 261 10*3/uL (ref 150–400)
RBC: 4.56 MIL/uL (ref 4.22–5.81)
RDW: 15 % (ref 11.5–15.5)
WBC: 15.8 10*3/uL — AB (ref 4.0–10.5)

## 2017-12-17 LAB — PROTIME-INR
INR: 1.51
PROTHROMBIN TIME: 18 s — AB (ref 11.4–15.2)

## 2017-12-17 MED ORDER — WARFARIN SODIUM 7.5 MG PO TABS
7.5000 mg | ORAL_TABLET | Freq: Once | ORAL | Status: AC
  Administered 2017-12-17: 7.5 mg via ORAL
  Filled 2017-12-17: qty 1

## 2017-12-17 MED ORDER — METRONIDAZOLE 500 MG PO TABS
500.0000 mg | ORAL_TABLET | Freq: Three times a day (TID) | ORAL | Status: DC
  Administered 2017-12-17 – 2017-12-18 (×4): 500 mg via ORAL
  Filled 2017-12-17 (×4): qty 1

## 2017-12-17 NOTE — Progress Notes (Signed)
PROGRESS NOTE  [name] [name] AVW:[id number] DOB: 1934/07/22 DOA: 12/13/2017 PCP: [name], MD  Brief History: 83 y.o.malewith hx of DM2, UTI, IgA neph, HTN, afib , CLL and CAD direct admit from Dr Deeann Saint ENT office for mastoiditis/ OM w/ facial nerve palsy failing outpt abx.  Per pt's wife the recent history as follows:  09/28/17- has cyst removal from the R ear 10/04/17 - returned for removal of the packing 10/16/17 - throbbing pain in R ear, went to ED , CT scan done, went homewith clindamycin 10/17/17 - returned to ED w/ R facial droop, "Bell's palsy" diagnosed and started on prednisone taper 11/04/17 - seen in Dr Deeann Saint office today, not getting better on oral Rx , plan from hosp admit for IV abx , let coumadin wear off and do surgery (mastoidectomy) next week(11/10/17) 11/10/17 - Tympanomastoidectomy 12/06/17 - increase pain and drainage from right ear 12/13/17 - office f/u with Dr. 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 -placed PICC line--12/16/17 -continue ceftriaxone -restart metronidazole -continue vancomycin -plan IV abx through 01/23/18  Virl Axe Syndrome -finished valtrex and prednisone as outpt  chronicatrial fibrillation -Continue coumadin -continue digoxin  Diabetes mellitus type 2, uncontrolled with hyperglycemia -increase levemir 65 units -change to intermediate SSI -continue ISS -12/14/17--check A1C--9.5 -holding actos -increase pre-meal novolog to 6 units tiw  Essential hypertension -Continue lisinopril -restartedfurosemide  Chronic diastolic CHF -restart lasix -daily weights  Nonobstructive CAD -no anginal symptoms  Diarrhea -check cdiff    Disposition Plan: SNF 9/14 if stable Family Communication:Spouse  updatedat bedside 9/13  Consultants:ENT by phone--Teoh  Code Status: FULL   DVT Prophylaxis:coumadin   Procedures: As Listed in Progress Note Above  Antibiotics: Ceftriaxone 9/9>>> Metronidazole 9/9>> vanco 9/11>>>    Subjective: Pt complains of loose stool x 3 in past 24 hours.  Denies cp, sob, abd pain, n/v, f/c.  Right ear has moderate pain but slowly improving. Drainage from right ear is slowly improving.  dysuria  Objective: Vitals:   12/16/17 1959 12/16/17 2146 12/17/17 0512 12/17/17 1550  BP:  139/67 (!) 160/66 136/63  Pulse:  79 75 72  Resp:  18 20 18   Temp:  98.4 F (36.9 C) 98.7 F (37.1 C) 98 F (36.7 C)  TempSrc:  Oral Oral Oral  SpO2: 98% 94% 95% 97%  Weight:      Height:        Intake/Output Summary (Last 24 hours) at 12/17/2017 1716 Last data filed at 12/17/2017 1030 Gross per 24 hour  Intake 480.72 ml  Output -  Net 480.72 ml   Weight change:  Exam:   General:  Pt is alert, follows commands appropriately, not in acute distress  HEENT: No icterus, No thrush, No neck mass, [place]/AT  Cardiovascular: RRR, S1/S2, no rubs, no gallops  Respiratory: CTA bilaterally, no wheezing, no crackles, no rhonchi  Abdomen: Soft/+BS, non tender, non distended, no guarding  Extremities: No edema, No lymphangitis, No petechiae, No rashes, no synovitis   Data Reviewed: I have personally reviewed following labs and imaging studies Basic Metabolic Panel: Recent Labs  Lab 12/13/17 1431 12/14/17 0527 12/16/17 0517 12/17/17 0527  NA 136 138 138 138  K 4.1 4.5 4.3 4.0  CL 99 100 100 100  CO2 29 30 29 28   GLUCOSE 196* 196* 248* 223*  BUN 24* 23 20 18  CREATININE 0.89 0.86 0.80 0.70  CALCIUM 10.6* 9.8 9.2 9.1   Liver Function Tests: Recent Labs  Lab 12/14/17 0527  AST 19  ALT 17  ALKPHOS 98  BILITOT 0.8  PROT 6.2*  ALBUMIN 3.1*   No results for input(s): LIPASE, AMYLASE in the last 168 hours. No results for input(s): AMMONIA in the  last 168 hours. Coagulation Profile: Recent Labs  Lab 12/13/17 1431 12/13/17 2157 12/15/17 0520 12/16/17 0517 12/17/17 0527  INR 4.43* 4.28* 3.16 1.93 1.51   CBC: Recent Labs  Lab 12/13/17 1431 12/14/17 0527 12/15/17 0921 12/16/17 0517 12/17/17 0527  WBC 19.5* 15.5* 14.1* 14.3* 15.8*  NEUTROABS 7.4  --   --   --   --   HGB 13.6 12.6* 12.8* 12.8* 13.1  HCT 40.9 39.0 38.9* 38.9* 39.8  MCV 87.0 87.8 87.8 88.6 87.3  PLT 301 243 218 246 261   Cardiac Enzymes: No results for input(s): CKTOTAL, CKMB, CKMBINDEX, TROPONINI in the last 168 hours. BNP: Invalid input(s): POCBNP CBG: Recent Labs  Lab 12/16/17 1116 12/16/17 1620 12/16/17 2144 12/17/17 0743 12/17/17 1159  GLUCAP 271* 135* 159* 217* 254*   HbA1C: No results for input(s): HGBA1C in the last 72 hours. Urine analysis:    Component Value Date/Time   COLORURINE YELLOW 12/14/2017 0050   APPEARANCEUR CLEAR 12/14/2017 0050   LABSPEC 1.008 12/14/2017 0050   PHURINE 6.0 12/14/2017 0050   GLUCOSEU NEGATIVE 12/14/2017 0050   HGBUR NEGATIVE 12/14/2017 0050   BILIRUBINUR NEGATIVE 12/14/2017 0050   KETONESUR NEGATIVE 12/14/2017 0050   PROTEINUR NEGATIVE 12/14/2017 0050   NITRITE NEGATIVE 12/14/2017 0050   LEUKOCYTESUR NEGATIVE 12/14/2017 0050   Sepsis Labs: @LABRCNTIP (procalcitonin:4,lacticidven:4) )No results found for this or any previous visit (from the past 240 hour(s)).   Scheduled Meds: . atorvastatin  10 mg Oral QPM  . calcium-vitamin D  2 tablet Oral Q breakfast  . cholecalciferol  1,000 Units Oral q morning - 10a  . digoxin  0.125 mg Oral Q breakfast  . feeding supplement (GLUCERNA SHAKE)  237 mL Oral BID BM  . finasteride  5 mg Oral q morning - 10a  . furosemide  20 mg Oral Daily  . insulin aspart  0-15 Units Subcutaneous TID WC  . insulin aspart  0-5 Units Subcutaneous QHS  . insulin aspart  6 Units Subcutaneous TID WC  . insulin detemir  65 Units Subcutaneous q morning - 10a  . lisinopril  40 mg  Oral Q supper  . metroNIDAZOLE  500 mg Oral Q8H  . multivitamin with minerals  1 tablet Oral Q breakfast  . sodium chloride flush  10-40 mL Intracatheter Q12H  . sodium chloride flush  3 mL Intravenous Q12H  . vitamin C  1,000 mg Oral Q breakfast  . Warfarin - Pharmacist Dosing Inpatient   Does not apply q1800   Continuous Infusions: . sodium chloride Stopped (12/17/17 1535)  . cefTRIAXone (ROCEPHIN)  IV Stopped (12/16/17 1747)  . vancomycin 750 mg (12/17/17 1040)    Procedures/Studies: Dg Chest Port 1 View  Result Date: 12/16/2017 CLINICAL DATA:  PICC line placement. EXAM: PORTABLE CHEST 1 VIEW COMPARISON:  06/29/2016 FINDINGS: Right PICC line terminates at the expected location of proximal superior vena cava. Enlarged cardiac silhouette. Calcific atherosclerotic disease of the aorta. There is no evidence of pneumothorax. Coarsening of the interstitium. Osseous structures are without acute abnormality. Soft tissues are grossly normal. IMPRESSION: Right PICC line terminates at the expected location of proximal superior vena cava. No  evidence of pneumothorax. Electronically Signed   By: Fidela Salisbury M.D.   On: 12/16/2017 12:55    Orson Eva, DO  [facility] Pager [phone]  If 7PM-7AM, please contact night-coverage [email or web address] Password [email or web address] 12/17/2017, 5:16 PM   LOS: 4 days

## 2017-12-17 NOTE — Progress Notes (Signed)
[place] for warfarin Indication: atrial fibrillation  Allergies  Allergen Reactions  . Erythromycin Other (See Comments)  . Penicillins Hives and Other (See Comments)    Has patient had a PCN reaction causing immediate rash, facial/tongue/throat swelling, SOB or lightheadedness with hypotension: No Has patient had a PCN reaction causing severe rash involving mucus membranes or skin necrosis: No Has patient had a PCN reaction that required hospitalization No Has patient had a PCN reaction occurring within the last 10 years: No If all of the above answers are "NO", then may proceed with Cephalosporin use.  Has tolerated Rocephin    Patient Measurements: Height: 6' (182.9 cm) Weight: 258 lb 6.4 oz (117.2 kg) IBW/kg (Calculated) : 77.6   Vital Signs: Temp: 98.7 F (37.1 C) (09/13 0512) Temp Source: Oral (09/13 0512) BP: 160/66 (09/13 0512) Pulse Rate: 75 (09/13 0512)  Labs: Recent Labs    12/15/17 0520  12/15/17 0921 12/16/17 0517 12/17/17 0527  HGB  --    < > 12.8* 12.8* 13.1  HCT  --   --  38.9* 38.9* 39.8  PLT  --   --  218 246 261  LABPROT 32.2*  --   --  21.9* 18.0*  INR 3.16  --   --  1.93 1.51  CREATININE  --   --   --  0.80 0.70   < > = values in this interval not displayed.    Estimated Creatinine Clearance: 95.7 mL/min (by C-G formula based on SCr of 0.7 mg/dL).   Medical History: Past Medical History:  Diagnosis Date  . Atrial fibrillation ([place])   . Chronic lymphocytic leukemia ([place])   . Coronary atherosclerosis of native coronary artery    Nonobstructive  . Essential hypertension, benign   . Hyperlipidemia   . IgA nephropathy    Dr. Lowanda Foster  . Type 2 diabetes mellitus ([place])   . UTI (urinary tract infection) july  10 th   taking  med     Medications:  Medications Prior to Admission  Medication Sig Dispense Refill Last Dose  . Ascorbic Acid (VITAMIN C) 1000 MG tablet Take 1,000 mg by mouth daily with  breakfast.    12/13/2017 at Unknown time  . atorvastatin (LIPITOR) 10 MG tablet Take 10 mg by mouth every evening.   12/12/2017 at Unknown time  . Calcium Carb-Cholecalciferol (CALCIUM 600+D) 600-800 MG-UNIT TABS Take 2 tablets by mouth daily with breakfast.   12/13/2017 at Unknown time  . cholecalciferol (VITAMIN D) 1000 units tablet Take 1,000 Units by mouth every morning.   12/13/2017 at Unknown time  . ciclopirox (LOPROX) 0.77 % cream Apply 1 application topically every morning. Applied to feet in the morning   12/13/2017 at Unknown time  . Coenzyme Q10 (CO Q-10) 100 MG CAPS Take 100 mg by mouth daily with breakfast.    12/13/2017 at Unknown time  . digoxin (LANOXIN) 0.125 MG tablet Take 0.125 mg by mouth daily with breakfast.    12/13/2017 at Unknown time  . finasteride (PROSCAR) 5 MG tablet Take 5 mg by mouth every morning.    12/13/2017 at Unknown time  . furosemide (LASIX) 20 MG tablet Take 2 pills a day (Patient taking differently: Take 20 mg by mouth daily. ) 20 tablet 0 12/13/2017 at Unknown time  . glimepiride (AMARYL) 2 MG tablet Take 2 mg by mouth daily with breakfast.    12/13/2017 at Unknown time  . insulin aspart (NOVOLOG FLEXPEN) 100 UNIT/ML  FlexPen Inject 4-18 Units into the skin 2 (two) times daily. Pt uses per sliding scale before lunch and dinner.   12/13/2017 at Unknown time  . Insulin Detemir (LEVEMIR FLEXPEN) 100 UNIT/ML Pen Inject 60 Units into the skin every morning. 15 mL 0 12/13/2017 at Unknown time  . lisinopril (PRINIVIL,ZESTRIL) 20 MG tablet Take 40 mg by mouth daily with supper.   12/12/2017 at Unknown time  . LORazepam (ATIVAN) 1 MG tablet Take 2 tablets by mouth at bedtime.    12/12/2017 at Unknown time  . Multiple Vitamin (MULTIVITAMIN WITH MINERALS) TABS tablet Take 1 tablet by mouth daily with breakfast.   12/13/2017 at Unknown time  . Omega-3 Fatty Acids (FISH OIL) 1200 MG CAPS Take 2 capsules by mouth 2 (two) times daily.   12/13/2017 at Unknown time  . pioglitazone (ACTOS) 15 MG tablet Take  15 mg by mouth every evening.    12/12/2017 at Unknown time  . VICTOZA 18 MG/3ML SOPN Inject 1.8 mg into the skin daily with breakfast.   12/13/2017 at Unknown time  . vitamin E 400 UNIT capsule Take 400 Units by mouth every morning.   12/13/2017 at Unknown time  . warfarin (COUMADIN) 7.5 MG tablet Take 5-7.5 mg by mouth See admin instructions. 5mg  on Tuesdays and Fridays. Takes 7.5mg  on all other days   12/12/2017 at 18-1900    Assessment: Pharmacy consulted to dose warfarin for patient with atrial fibrillation. Patient's INR today is 1.51(no doses given 9/09-.9/11. Will give 7.5mg  since PT-INR decreased  Home dose:5 mg on Tues-Fri and 7.5 mg ROW  Goal of Therapy:  INR 2-3 Monitor platelets by anticoagulation protocol: Yes   Plan:  Warfarin 7.5 mg x 1 dose Monitor daily INR and s/s of bleeding  Isac Sarna, BS [place], [place] Clinical Pharmacist Pager [phone] 12/17/2017 12:08 PM

## 2017-12-17 NOTE — Clinical Social Work Note (Signed)
CSW following. Pt has accepted a bed at [facility]. Per MD, pt not stable for dc today. MD anticipating pt may be stable over the weekend. Updated Kerri at [facility]. Weekend CSW will be available to assist with dc as needed.

## 2017-12-17 NOTE — Care Management Important Message (Signed)
Important Message  Patient Details  Name: [name] [name] MRN: [id number] Date of Birth: 07-01-1934   Medicare Important Message Given:  Yes    [name] 12/17/2017, 10:15 AM

## 2017-12-18 ENCOUNTER — Inpatient Hospital Stay
Admission: RE | Admit: 2017-12-18 | Discharge: 2018-01-24 | Disposition: A | Discharge status: Home / Self Care | Payer: Medicare Other | Source: Ambulatory Visit | Attending: Internal Medicine | Admitting: Internal Medicine

## 2017-12-18 DIAGNOSIS — M868X8 Other osteomyelitis, other site: Secondary | ICD-10-CM | POA: Diagnosis not present

## 2017-12-18 DIAGNOSIS — Z96651 Presence of right artificial knee joint: Secondary | ICD-10-CM | POA: Diagnosis not present

## 2017-12-18 DIAGNOSIS — Z89422 Acquired absence of other left toe(s): Secondary | ICD-10-CM | POA: Diagnosis not present

## 2017-12-18 DIAGNOSIS — H60321 Hemorrhagic otitis externa, right ear: Secondary | ICD-10-CM | POA: Diagnosis not present

## 2017-12-18 DIAGNOSIS — I482 Chronic atrial fibrillation, unspecified: Secondary | ICD-10-CM | POA: Diagnosis not present

## 2017-12-18 DIAGNOSIS — Z7901 Long term (current) use of anticoagulants: Secondary | ICD-10-CM | POA: Diagnosis not present

## 2017-12-18 DIAGNOSIS — R1319 Other dysphagia: Secondary | ICD-10-CM | POA: Diagnosis not present

## 2017-12-18 DIAGNOSIS — E162 Hypoglycemia, unspecified: Secondary | ICD-10-CM | POA: Diagnosis not present

## 2017-12-18 DIAGNOSIS — I5032 Chronic diastolic (congestive) heart failure: Secondary | ICD-10-CM | POA: Diagnosis not present

## 2017-12-18 DIAGNOSIS — I110 Hypertensive heart disease with heart failure: Secondary | ICD-10-CM | POA: Diagnosis not present

## 2017-12-18 DIAGNOSIS — Z794 Long term (current) use of insulin: Secondary | ICD-10-CM | POA: Diagnosis not present

## 2017-12-18 DIAGNOSIS — H7091 Unspecified mastoiditis, right ear: Secondary | ICD-10-CM | POA: Diagnosis not present

## 2017-12-18 DIAGNOSIS — G510 Bell's palsy: Secondary | ICD-10-CM | POA: Diagnosis not present

## 2017-12-18 DIAGNOSIS — C9110 Chronic lymphocytic leukemia of B-cell type not having achieved remission: Secondary | ICD-10-CM | POA: Diagnosis not present

## 2017-12-18 DIAGNOSIS — Z978 Presence of other specified devices: Secondary | ICD-10-CM | POA: Diagnosis not present

## 2017-12-18 DIAGNOSIS — E785 Hyperlipidemia, unspecified: Secondary | ICD-10-CM | POA: Diagnosis not present

## 2017-12-18 DIAGNOSIS — Z95828 Presence of other vascular implants and grafts: Secondary | ICD-10-CM | POA: Diagnosis not present

## 2017-12-18 DIAGNOSIS — R262 Difficulty in walking, not elsewhere classified: Secondary | ICD-10-CM | POA: Diagnosis not present

## 2017-12-18 DIAGNOSIS — E1165 Type 2 diabetes mellitus with hyperglycemia: Secondary | ICD-10-CM | POA: Diagnosis not present

## 2017-12-18 DIAGNOSIS — I2510 Atherosclerotic heart disease of native coronary artery without angina pectoris: Secondary | ICD-10-CM | POA: Diagnosis not present

## 2017-12-18 DIAGNOSIS — I10 Essential (primary) hypertension: Secondary | ICD-10-CM | POA: Diagnosis not present

## 2017-12-18 DIAGNOSIS — M6281 Muscle weakness (generalized): Secondary | ICD-10-CM | POA: Diagnosis not present

## 2017-12-18 DIAGNOSIS — E782 Mixed hyperlipidemia: Secondary | ICD-10-CM | POA: Diagnosis not present

## 2017-12-18 DIAGNOSIS — H7011 Chronic mastoiditis, right ear: Secondary | ICD-10-CM | POA: Diagnosis not present

## 2017-12-18 DIAGNOSIS — H70001 Acute mastoiditis without complications, right ear: Secondary | ICD-10-CM | POA: Diagnosis not present

## 2017-12-18 DIAGNOSIS — R1310 Dysphagia, unspecified: Secondary | ICD-10-CM | POA: Diagnosis not present

## 2017-12-18 DIAGNOSIS — I69822 Dysarthria following other cerebrovascular disease: Secondary | ICD-10-CM | POA: Diagnosis not present

## 2017-12-18 DIAGNOSIS — R05 Cough: Secondary | ICD-10-CM | POA: Diagnosis not present

## 2017-12-18 DIAGNOSIS — N289 Disorder of kidney and ureter, unspecified: Secondary | ICD-10-CM | POA: Diagnosis not present

## 2017-12-18 DIAGNOSIS — Z48810 Encounter for surgical aftercare following surgery on the sense organs: Secondary | ICD-10-CM | POA: Diagnosis not present

## 2017-12-18 LAB — BASIC METABOLIC PANEL
ANION GAP: 7 (ref 5–15)
BUN: 20 mg/dL (ref 8–23)
CALCIUM: 8.8 mg/dL — AB (ref 8.9–10.3)
CHLORIDE: 100 mmol/L (ref 98–111)
CO2: 28 mmol/L (ref 22–32)
CREATININE: 0.77 mg/dL (ref 0.61–1.24)
GFR calc non Af Amer: >60 mL/min (ref >60)
Glucose, Bld: 260 mg/dL — ABNORMAL HIGH (ref 70–99)
Potassium: 4.1 mmol/L (ref 3.5–5.1)
SODIUM: 135 mmol/L (ref 135–145)

## 2017-12-18 LAB — CBC
HCT: 38 % — ABNORMAL LOW (ref 39.0–52.0)
Hemoglobin: 12.2 g/dL — ABNORMAL LOW (ref 13.0–17.0)
MCH: 28.2 pg (ref 26.0–34.0)
MCHC: 32.1 g/dL (ref 30.0–36.0)
MCV: 88 fL (ref 78.0–100.0)
PLATELETS: 244 10*3/uL (ref 150–400)
RBC: 4.32 MIL/uL (ref 4.22–5.81)
RDW: 14.9 % (ref 11.5–15.5)
WBC: 14.7 10*3/uL — AB (ref 4.0–10.5)

## 2017-12-18 LAB — VANCOMYCIN, TROUGH: VANCOMYCIN TR: 9 ug/mL — AB (ref 15–20)

## 2017-12-18 LAB — PROTIME-INR
INR: 1.67
PROTHROMBIN TIME: 19.5 s — AB (ref 11.4–15.2)

## 2017-12-18 LAB — GLUCOSE, CAPILLARY
GLUCOSE-CAPILLARY: 248 mg/dL — AB (ref 70–99)
Glucose-Capillary: 244 mg/dL — ABNORMAL HIGH (ref 70–99)

## 2017-12-18 MED ORDER — METRONIDAZOLE 500 MG PO TABS: 500.0000 mg | ORAL_TABLET | Freq: Three times a day (TID) | ORAL | 0 refills | Status: DC

## 2017-12-18 MED ORDER — INSULIN ASPART 100 UNIT/ML ~~LOC~~ SOLN: 9.0000 [IU] | Freq: Three times a day (TID) | SUBCUTANEOUS | Status: DC

## 2017-12-18 MED ORDER — INSULIN ASPART 100 UNIT/ML ~~LOC~~ SOLN
10.0000 [IU] | Freq: Three times a day (TID) | SUBCUTANEOUS | Status: DC
  Administered 2017-12-18: 10 [IU] via SUBCUTANEOUS

## 2017-12-18 MED ORDER — VANCOMYCIN HCL 10 G IV SOLR
1250.0000 mg | Freq: Two times a day (BID) | INTRAVENOUS | Status: DC
  Administered 2017-12-18: 1250 mg via INTRAVENOUS
  Filled 2017-12-18 (×5): qty 1250

## 2017-12-18 MED ORDER — INSULIN DETEMIR 100 UNIT/ML ~~LOC~~ SOLN
70.0000 [IU] | Freq: Every morning | SUBCUTANEOUS | Status: DC
  Filled 2017-12-18 (×2): qty 0.7

## 2017-12-18 MED ORDER — WARFARIN SODIUM 7.5 MG PO TABS: 7.5000 mg | ORAL_TABLET | Freq: Once | ORAL | Status: DC

## 2017-12-18 MED ORDER — TRAMADOL HCL 50 MG PO TABS: 50.0000 mg | ORAL_TABLET | Freq: Four times a day (QID) | ORAL | 0 refills | Status: DC | PRN

## 2017-12-18 MED ORDER — INSULIN DETEMIR 100 UNIT/ML ~~LOC~~ SOLN: 72.0000 [IU] | Freq: Every morning | SUBCUTANEOUS | 0 refills | Status: DC

## 2017-12-18 MED ORDER — INSULIN ASPART 100 UNIT/ML FLEXPEN: 10.0000 [IU] | PEN_INJECTOR | Freq: Three times a day (TID) | SUBCUTANEOUS | 11 refills | Status: DC

## 2017-12-18 MED ORDER — SODIUM CHLORIDE 0.9 % IV SOLN: 2.0000 g | INTRAVENOUS | 0 refills | Status: DC

## 2017-12-18 MED ORDER — VANCOMYCIN HCL 10 G IV SOLR: 1250.0000 mg | Freq: Two times a day (BID) | INTRAVENOUS | 0 refills | Status: DC

## 2017-12-18 NOTE — Progress Notes (Signed)
Patient discharged to [facility] called and given to [facility] LPN. Vital signs stable. Single lumen PICC line in place. Accompanied by staff to an awaiting facility.

## 2017-12-18 NOTE — Progress Notes (Addendum)
Pharmacy Antibiotic Note  [name] [name] is a 83 y.o. male admitted on 12/13/2017 with mastoiditis.  Pharmacy has been consulted for Vancomycin dosing. Pt failed outpatient antibiotics.  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 Vancomycin trough 45mcg/ml, will adjust dose.  Plan: Increase Vancomycin 1250mg  IV every 12 hours.  Goal trough 15-20 mcg/mL Also on Ceftriaxone 2gm IV q24hrs Metronidazole 500mg  po Q8H F/u cxs and clinical progress Monitor V/S, labs and levels as indicated  Height: 6' (182.9 cm) Weight: 258 lb 6.4 oz (117.2 kg) IBW/kg (Calculated) : 77.6  Temp (24hrs), Avg:98 F (36.7 C), Min:98 F (36.7 C), Max:98.1 F (36.7 C)  Recent Labs  Lab 12/13/17 1431 12/14/17 0527 12/15/17 0921 12/16/17 0517 12/17/17 0527 12/18/17 0513 12/18/17 0901  WBC 19.5* 15.5* 14.1* 14.3* 15.8* 14.7*  --   CREATININE 0.89 0.86  --  0.80 0.70 0.77  --   VANCOTROUGH  --   --   --   --   --   --  9*    Estimated Creatinine Clearance: 95.7 mL/min (by C-G formula based on SCr of 0.77 mg/dL).    Allergies  Allergen Reactions  . Erythromycin Other (See Comments)  . Penicillins Hives and Other (See Comments)    Has patient had a PCN reaction causing immediate rash, facial/tongue/throat swelling, SOB or lightheadedness with hypotension: No Has patient had a PCN reaction causing severe rash involving mucus membranes or skin necrosis: No Has patient had a PCN reaction that required hospitalization No Has patient had a PCN reaction occurring within the last 10 years: No If all of the above answers are "NO", then may proceed with Cephalosporin use.  Has tolerated Rocephin    Antimicrobials this admission: Ceftriaxone 9/9 >> Flagyl 9/9 >>9/11 Vancomycin 9/11>>  Dose adjustments this admission: 9/14 increase vancomycin 1250mg  IV q12h  Microbiology results: No cultures  Thank you for allowing pharmacy to be a part of this  patient's care.  Isac Sarna, BS Pharm D, [place] Clinical Pharmacist Pager [phone] 12/18/2017 10:11 AM

## 2017-12-18 NOTE — Progress Notes (Signed)
[name] [name] for warfarin Indication: atrial fibrillation  Allergies  Allergen Reactions  . Erythromycin Other (See Comments)  . Penicillins Hives and Other (See Comments)    Has patient had a PCN reaction causing immediate rash, facial/tongue/throat swelling, SOB or lightheadedness with hypotension: No Has patient had a PCN reaction causing severe rash involving mucus membranes or skin necrosis: No Has patient had a PCN reaction that required hospitalization No Has patient had a PCN reaction occurring within the last 10 years: No If all of the above answers are "NO", then may proceed with Cephalosporin use.  Has tolerated Rocephin    Patient Measurements: Height: 6' (182.9 cm) Weight: 258 lb 6.4 oz (117.2 kg) IBW/kg (Calculated) : 77.6   Vital Signs: Temp: 98 F (36.7 C) (09/14 0536) Temp Source: Oral (09/14 0536) BP: 148/60 (09/14 0536) Pulse Rate: 72 (09/14 0536)  Labs: Recent Labs    12/16/17 0517 12/17/17 0527 12/18/17 0513  HGB 12.8* 13.1 12.2*  HCT 38.9* 39.8 38.0*  PLT 246 261 244  LABPROT 21.9* 18.0* 19.5*  INR 1.93 1.51 1.67  CREATININE 0.80 0.70 0.77    Estimated Creatinine Clearance: 95.7 mL/min (by C-G formula based on SCr of 0.77 mg/dL).   Medical History: Past Medical History:  Diagnosis Date  . Atrial fibrillation ([place])   . Chronic lymphocytic leukemia ([place])   . Coronary atherosclerosis of native coronary artery    Nonobstructive  . Essential hypertension, benign   . Hyperlipidemia   . IgA nephropathy    Dr. Lowanda Foster  . Type 2 diabetes mellitus ([place])   . UTI (urinary tract infection) july  10 th   taking  med     Medications:  Medications Prior to Admission  Medication Sig Dispense Refill Last Dose  . Ascorbic Acid (VITAMIN C) 1000 MG tablet Take 1,000 mg by mouth daily with breakfast.    12/13/2017 at Unknown time  . atorvastatin (LIPITOR) 10 MG tablet Take 10 mg by mouth every evening.   12/12/2017 at  Unknown time  . Calcium Carb-Cholecalciferol (CALCIUM 600+D) 600-800 MG-UNIT TABS Take 2 tablets by mouth daily with breakfast.   12/13/2017 at Unknown time  . cholecalciferol (VITAMIN D) 1000 units tablet Take 1,000 Units by mouth every morning.   12/13/2017 at Unknown time  . ciclopirox (LOPROX) 0.77 % cream Apply 1 application topically every morning. Applied to feet in the morning   12/13/2017 at Unknown time  . Coenzyme Q10 (CO Q-10) 100 MG CAPS Take 100 mg by mouth daily with breakfast.    12/13/2017 at Unknown time  . digoxin (LANOXIN) 0.125 MG tablet Take 0.125 mg by mouth daily with breakfast.    12/13/2017 at Unknown time  . finasteride (PROSCAR) 5 MG tablet Take 5 mg by mouth every morning.    12/13/2017 at Unknown time  . furosemide (LASIX) 20 MG tablet Take 2 pills a day (Patient taking differently: Take 20 mg by mouth daily. ) 20 tablet 0 12/13/2017 at Unknown time  . glimepiride (AMARYL) 2 MG tablet Take 2 mg by mouth daily with breakfast.    12/13/2017 at Unknown time  . insulin aspart (NOVOLOG FLEXPEN) 100 UNIT/ML FlexPen Inject 4-18 Units into the skin 2 (two) times daily. Pt uses per sliding scale before lunch and dinner.   12/13/2017 at Unknown time  . Insulin Detemir (LEVEMIR FLEXPEN) 100 UNIT/ML Pen Inject 60 Units into the skin every morning. 15 mL 0 12/13/2017 at Unknown time  . lisinopril (  PRINIVIL,ZESTRIL) 20 MG tablet Take 40 mg by mouth daily with supper.   12/12/2017 at Unknown time  . LORazepam (ATIVAN) 1 MG tablet Take 2 tablets by mouth at bedtime.    12/12/2017 at Unknown time  . Multiple Vitamin (MULTIVITAMIN WITH MINERALS) TABS tablet Take 1 tablet by mouth daily with breakfast.   12/13/2017 at Unknown time  . Omega-3 Fatty Acids (FISH OIL) 1200 MG CAPS Take 2 capsules by mouth 2 (two) times daily.   12/13/2017 at Unknown time  . pioglitazone (ACTOS) 15 MG tablet Take 15 mg by mouth every evening.    12/12/2017 at Unknown time  . VICTOZA 18 MG/3ML SOPN Inject 1.8 mg into the skin daily with  breakfast.   12/13/2017 at Unknown time  . vitamin E 400 UNIT capsule Take 400 Units by mouth every morning.   12/13/2017 at Unknown time  . warfarin (COUMADIN) 7.5 MG tablet Take 5-7.5 mg by mouth See admin instructions. 5mg  on Tuesdays and Fridays. Takes 7.5mg  on all other days   12/12/2017 at 18-1900    Assessment: Pharmacy consulted to dose warfarin for patient with atrial fibrillation. Patient's INR today is 1.67(no doses given 9/09-.9/11(due to supratherapeutic),  Trending towards goal. Home dose:5 mg on Tues-Fri and 7.5 mg ROW  Goal of Therapy:  INR 2-3 Monitor platelets by anticoagulation protocol: Yes   Plan:  Warfarin 7.5 mg x 1 dose Monitor daily INR and s/s of bleeding  Isac Sarna, BS [place], [place] Clinical Pharmacist Pager [phone] 12/18/2017 10:46 AM

## 2017-12-18 NOTE — Discharge Summary (Signed)
Physician Discharge Summary  [name] [name] GQQ:[id number] DOB: 03-27-1935 DOA: 12/13/2017  PCP: [name], MD  Admit date: 12/13/2017 Discharge date: 12/18/2017  Admitted From: Home Disposition:  SNF  Recommendations for Outpatient Follow-up:  1. Follow up with PCP in 1-2 weeks 2. Please obtain BMP/CBC every 7 days starting 12/20/17 while on antibiotics 3. Please obtain vancomycin trough on 12/20/17 and adjust vancomycin dose for trough 15-20 4. Please check INR on 12/20/17 and adjust warfarin dose for INR 2-3 5. Continue ceftriaxone, vancomycin and metronidazole--last day on 01/23/18 6. Discontinue PICC line after last dose of antibiotics on 01/23/18 7. Please check CBGs ac/hs and adjust insulin accordingly    Discharge Condition: Stable CODE STATUS:FULL Diet recommendation: Heart Healthy   Brief/Interim Summary: 83 y.o.malewith hx of DM2, UTI, IgA neph, HTN, afib , CLL and CAD direct admit from Dr Deeann Saint ENT office for mastoiditis/ OM w/ facial nerve palsy failing outpt abx. Subsequently had Tympanomastoidectomy on 11/10/17 after hospitalization for IV abx (8/1-8/6).  3 weeks after surgery, pt noted increasing pain and drainage from right ear.  Had office follow up 12/13/17-->set to hospital for IV abx. Per pt's wife the recent history as follows:  09/28/17- has cyst removal from the R ear 10/04/17 - returned for removal of the packing 10/16/17 - throbbing pain in R ear, went to ED , CT scan done, went homewith clindamycin 10/17/17 - returned to ED w/ R facial droop, "Bell's palsy" diagnosed and started on prednisone taper 11/04/17 - seen in Dr Deeann Saint office today, not getting better on oral Rx , plan from hosp admit for IV abx , let coumadin wear off and do surgery (mastoidectomy) next week(11/10/17) 11/10/17 - Tympanomastoidectomy 12/06/17 - increase pain and drainage from right ear 12/13/17 - office f/u with Dr. 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 as outpt  chronicatrial fibrillation -Continue coumadin -continue digoxin  Diabetes mellitus type 2, uncontrolled with hyperglycemia -increaselevemir 72units -change to intermediate SSI -continue ISS during hospitalization -12/14/17--check A1C--9.5 -holding actos--restart after d/c -increase pre-meal novolog to 10 units tiw  Essential hypertension -Continue lisinopril -restartedfurosemide  Chronic diastolic CHF -restart lasix -daily weights--discharge weight 117.2  Nonobstructive CAD -no anginal symptoms  Hyperlipidemia -continue statin    Discharge Instructions   Allergies as of 12/18/2017      Reactions   Erythromycin Other (See Comments)   Penicillins Hives, Other (See Comments)   Has patient had a PCN reaction causing immediate rash, facial/tongue/throat swelling, SOB or lightheadedness with hypotension: No Has patient had a PCN reaction causing severe rash involving mucus membranes or skin necrosis: No Has patient had a PCN reaction that required hospitalization No Has patient had a PCN reaction occurring within the last 10 years: No If all of the above answers are "NO", then may proceed with Cephalosporin use. Has tolerated Rocephin      Medication List    TAKE these medications   atorvastatin 10 MG tablet Commonly known as:  LIPITOR Take 10 mg by mouth every evening.   CALCIUM 600+D 600-800 MG-UNIT Tabs Generic drug:  Calcium Carb-Cholecalciferol Take 2 tablets by mouth daily with breakfast.   cefTRIAXone 2 g in sodium chloride 0.9 % 100 mL Inject 2 g into the vein daily. Last  dose on  01/23/18   cholecalciferol 1000 units tablet Commonly known as:  VITAMIN D Take 1,000 Units by mouth every morning.   ciclopirox 0.77 % cream Commonly known as:  LOPROX Apply 1 application topically every morning. Applied to feet in the morning   Co Q-10 100 MG Caps Take 100 mg by mouth daily with breakfast.   digoxin 0.125 MG tablet Commonly known as:  LANOXIN Take 0.125 mg by mouth daily with breakfast.   finasteride 5 MG tablet Commonly known as:  PROSCAR Take 5 mg by mouth every morning.   Fish Oil 1200 MG Caps Take 2 capsules by mouth 2 (two) times daily.   furosemide 20 MG tablet Commonly known as:  LASIX Take 2 pills a day What changed:    how much to take  how to take this  when to take this  additional instructions   glimepiride 2 MG tablet Commonly known as:  AMARYL Take 2 mg by mouth daily with breakfast.   insulin aspart 100 UNIT/ML FlexPen Commonly known as:  NOVOLOG Inject 10 Units into the skin 3 (three) times daily with meals. What changed:    how much to take  when to take this  additional instructions   insulin detemir 100 UNIT/ML injection Commonly known as:  LEVEMIR Inject 0.72 mLs (72 Units total) into the skin every morning. Start taking on:  12/19/2017 What changed:  how much to take   lisinopril 20 MG tablet Commonly known as:  PRINIVIL,ZESTRIL Take 40 mg by mouth daily with supper.   LORazepam 1 MG tablet Commonly known as:  ATIVAN Take 2 tablets by mouth at bedtime.   metroNIDAZOLE 500 MG tablet Commonly known as:  FLAGYL Take 1 tablet (500 mg total) by mouth every 8 (eight) hours. Last dose on 01/23/18   multivitamin with minerals Tabs tablet Take 1 tablet by mouth daily with breakfast.   pioglitazone 15 MG tablet Commonly known as:  ACTOS Take 15 mg by mouth every evening.   traMADol 50 MG tablet Commonly known as:  ULTRAM Take 1 tablet (50 mg total) by mouth every 6 (six) hours as needed for moderate  pain.   vancomycin 1,250 mg in sodium chloride 0.9 % 250 mL Inject 1,250 mg into the vein every 12 (twelve) hours. Last dose on 01/23/18   VICTOZA 18 MG/3ML Sopn Generic drug:  liraglutide Inject 1.8 mg into the skin daily with breakfast.   vitamin C 1000 MG tablet Take 1,000 mg by mouth daily with breakfast.   vitamin E 400 UNIT capsule Take 400 Units by mouth every morning.   warfarin 7.5 MG tablet Commonly known as:  COUMADIN Take as directed. If you are unsure how to take this medication, talk to your nurse or doctor. Original instructions:  Take 5-7.5 mg by mouth See admin instructions. 5mg  on Tuesdays and Fridays. Takes 7.5mg  on all other days      Contact information for after-discharge care    [facility] .   Service:  Skilled Nursing Contact information: [address] [place] [address] [phone]             Allergies  Allergen Reactions  . Erythromycin Other (See Comments)  . Penicillins Hives and Other (See Comments)    Has patient had a PCN reaction causing immediate rash, facial/tongue/throat swelling, SOB or lightheadedness with hypotension: No Has patient had a PCN reaction causing severe rash involving mucus membranes or skin necrosis:  No Has patient had a PCN reaction that required hospitalization No Has patient had a PCN reaction occurring within the last 10 years: No If all of the above answers are "NO", then may proceed with Cephalosporin use.  Has tolerated Rocephin    Consultations:  [facility]--[facility]   Procedures/Studies: Dg Chest Port 1 View  Result Date: 12/16/2017 CLINICAL DATA:  PICC line placement. EXAM: PORTABLE CHEST 1 VIEW COMPARISON:  06/29/2016 FINDINGS: Right PICC line terminates at the expected location of proximal superior vena cava. Enlarged cardiac silhouette. Calcific atherosclerotic disease of the aorta. There is no evidence of pneumothorax. Coarsening of the  interstitium. Osseous structures are without acute abnormality. Soft tissues are grossly normal. IMPRESSION: Right PICC line terminates at the expected location of proximal superior vena cava. No evidence of pneumothorax. Electronically Signed   By: Fidela Salisbury M.D.   On: 12/16/2017 12:55         Discharge Exam: Vitals:   12/17/17 2226 12/18/17 0536  BP: (!) 154/70 (!) 148/60  Pulse: 62 72  Resp: 20 14  Temp: 98.1 F (36.7 C) 98 F (36.7 C)  SpO2: 98% 99%   Vitals:   12/17/17 1550 12/17/17 2132 12/17/17 2226 12/18/17 0536  BP: 136/63  (!) 154/70 (!) 148/60  Pulse: 72  62 72  Resp: 18  20 14   Temp: 98 F (36.7 C)  98.1 F (36.7 C) 98 F (36.7 C)  TempSrc: Oral  Oral Oral  SpO2: 97% 96% 98% 99%  Weight:      Height:        General: Pt is alert, awake, not in acute distress Cardiovascular: RRR, S1/S2 +, no rubs, no gallops Respiratory: CTA bilaterally, no wheezing, no rhonchi Abdominal: Soft, NT, ND, bowel sounds + Extremities: no edema, no cyanosis   The results of significant diagnostics from this hospitalization (including imaging, microbiology, ancillary and laboratory) are listed below for reference.    Significant Diagnostic Studies: Dg Chest Port 1 View  Result Date: 12/16/2017 CLINICAL DATA:  PICC line placement. EXAM: PORTABLE CHEST 1 VIEW COMPARISON:  06/29/2016 FINDINGS: Right PICC line terminates at the expected location of proximal superior vena cava. Enlarged cardiac silhouette. Calcific atherosclerotic disease of the aorta. There is no evidence of pneumothorax. Coarsening of the interstitium. Osseous structures are without acute abnormality. Soft tissues are grossly normal. IMPRESSION: Right PICC line terminates at the expected location of proximal superior vena cava. No evidence of pneumothorax. Electronically Signed   By: Fidela Salisbury M.D.   On: 12/16/2017 12:55     Microbiology: No results found for this or any previous visit (from the  past 240 hour(s)).   Labs: Basic Metabolic Panel: Recent Labs  Lab 12/13/17 1431 12/14/17 0527 12/16/17 0517 12/17/17 0527 12/18/17 0513  NA 136 138 138 138 135  K 4.1 4.5 4.3 4.0 4.1  CL 99 100 100 100 100  CO2 29 30 29 28 28   GLUCOSE 196* 196* 248* 223* 260*  BUN 24* 23 20 18 20   CREATININE 0.89 0.86 0.80 0.70 0.77  CALCIUM 10.6* 9.8 9.2 9.1 8.8*   Liver Function Tests: Recent Labs  Lab 12/14/17 0527  AST 19  ALT 17  ALKPHOS 98  BILITOT 0.8  PROT 6.2*  ALBUMIN 3.1*   No results for input(s): LIPASE, AMYLASE in the last 168 hours. No results for input(s): AMMONIA in the last 168 hours. CBC: Recent Labs  Lab 12/13/17 1431 12/14/17 0527 12/15/17 0921 12/16/17 0517 12/17/17 0527 12/18/17 0513  WBC  19.5* 15.5* 14.1* 14.3* 15.8* 14.7*  NEUTROABS 7.4  --   --   --   --   --   HGB 13.6 12.6* 12.8* 12.8* 13.1 12.2*  HCT 40.9 39.0 38.9* 38.9* 39.8 38.0*  MCV 87.0 87.8 87.8 88.6 87.3 88.0  PLT 301 243 218 246 261 244   Cardiac Enzymes: No results for input(s): CKTOTAL, CKMB, CKMBINDEX, TROPONINI in the last 168 hours. BNP: Invalid input(s): POCBNP CBG: Recent Labs  Lab 12/16/17 2144 12/17/17 0743 12/17/17 1159 12/17/17 2154 12/18/17 0739  GLUCAP 159* 217* 254* 186* 244*    Time coordinating discharge:  36 minutes  Signed:  Orson Eva, DO [facility] Pager: [phone] 12/18/2017, 11:22 AM

## 2017-12-20 ENCOUNTER — Non-Acute Institutional Stay (SKILLED_NURSING_FACILITY): Payer: Medicare Other | Admitting: Internal Medicine

## 2017-12-20 ENCOUNTER — Encounter: Payer: Self-pay | Admitting: Internal Medicine

## 2017-12-20 ENCOUNTER — Encounter (HOSPITAL_COMMUNITY)
Admission: RE | Admit: 2017-12-20 | Discharge: 2017-12-20 | Disposition: A | Discharge status: Home / Self Care | Payer: Medicare Other | Source: Skilled Nursing Facility | Attending: Internal Medicine | Admitting: Internal Medicine

## 2017-12-20 ENCOUNTER — Other Ambulatory Visit: Payer: Self-pay

## 2017-12-20 DIAGNOSIS — I5032 Chronic diastolic (congestive) heart failure: Secondary | ICD-10-CM

## 2017-12-20 DIAGNOSIS — I10 Essential (primary) hypertension: Secondary | ICD-10-CM | POA: Diagnosis not present

## 2017-12-20 DIAGNOSIS — I110 Hypertensive heart disease with heart failure: Secondary | ICD-10-CM | POA: Insufficient documentation

## 2017-12-20 DIAGNOSIS — H70001 Acute mastoiditis without complications, right ear: Secondary | ICD-10-CM

## 2017-12-20 DIAGNOSIS — E1165 Type 2 diabetes mellitus with hyperglycemia: Secondary | ICD-10-CM

## 2017-12-20 DIAGNOSIS — I482 Chronic atrial fibrillation, unspecified: Secondary | ICD-10-CM

## 2017-12-20 DIAGNOSIS — M868X8 Other osteomyelitis, other site: Secondary | ICD-10-CM | POA: Insufficient documentation

## 2017-12-20 DIAGNOSIS — G510 Bell's palsy: Secondary | ICD-10-CM | POA: Diagnosis not present

## 2017-12-20 DIAGNOSIS — Z794 Long term (current) use of insulin: Secondary | ICD-10-CM | POA: Diagnosis not present

## 2017-12-20 DIAGNOSIS — I69822 Dysarthria following other cerebrovascular disease: Secondary | ICD-10-CM | POA: Insufficient documentation

## 2017-12-20 DIAGNOSIS — Z48810 Encounter for surgical aftercare following surgery on the sense organs: Secondary | ICD-10-CM | POA: Insufficient documentation

## 2017-12-20 DIAGNOSIS — Z7901 Long term (current) use of anticoagulants: Secondary | ICD-10-CM | POA: Insufficient documentation

## 2017-12-20 LAB — VANCOMYCIN, TROUGH: VANCOMYCIN TR: 14 ug/mL — AB (ref 15–20)

## 2017-12-20 LAB — CBC
HCT: 36.4 % — ABNORMAL LOW (ref 39.0–52.0)
Hemoglobin: 12.4 g/dL — ABNORMAL LOW (ref 13.0–17.0)
MCH: 30.2 pg (ref 26.0–34.0)
MCHC: 34.1 g/dL (ref 30.0–36.0)
MCV: 88.8 fL (ref 78.0–100.0)
PLATELETS: 260 10*3/uL (ref 150–400)
RBC: 4.1 MIL/uL — ABNORMAL LOW (ref 4.22–5.81)
RDW: 15.1 % (ref 11.5–15.5)
WBC: 16.8 10*3/uL — ABNORMAL HIGH (ref 4.0–10.5)

## 2017-12-20 LAB — BASIC METABOLIC PANEL
ANION GAP: 9 (ref 5–15)
BUN: 14 mg/dL (ref 8–23)
CO2: 27 mmol/L (ref 22–32)
Calcium: 8.9 mg/dL (ref 8.9–10.3)
Chloride: 103 mmol/L (ref 98–111)
Creatinine, Ser: 0.76 mg/dL (ref 0.61–1.24)
GFR calc Af Amer: >60 mL/min (ref >60)
GFR calc non Af Amer: >60 mL/min (ref >60)
Glucose, Bld: 141 mg/dL — ABNORMAL HIGH (ref 70–99)
Potassium: 3.9 mmol/L (ref 3.5–5.1)
SODIUM: 139 mmol/L (ref 135–145)

## 2017-12-20 LAB — PROTIME-INR
INR: 2.52
Prothrombin Time: 27 seconds — ABNORMAL HIGH (ref 11.4–15.2)

## 2017-12-20 LAB — GLUCOSE, CAPILLARY: Glucose-Capillary: 247 mg/dL — ABNORMAL HIGH (ref 70–99)

## 2017-12-20 MED ORDER — LORAZEPAM 1 MG PO TABS: 1.0000 mg | ORAL_TABLET | Freq: Every day | ORAL | 0 refills | Status: DC

## 2017-12-20 MED ORDER — TRAMADOL HCL 50 MG PO TABS: 50.0000 mg | ORAL_TABLET | Freq: Four times a day (QID) | ORAL | 0 refills | Status: DC | PRN

## 2017-12-20 NOTE — Telephone Encounter (Signed)
RX Fax for [facility]@ [phone]  

## 2017-12-20 NOTE — Progress Notes (Signed)
Provider:  Veleta Miners MD Location:    [place] Room Number: 158/P Place of Service:  SNF (31)  PCP: [name], MD Patient Care Team: [name], MD as PCP - General (Internal Medicine)  Extended Emergency Contact Information Primary Emergency Contact: Depascale,Norma Address: [address]          [place], [place] [address] Johnnette Litter of [place] Phone: [phone] Mobile Phone: [phone] Relation: Spouse  Code Status: Full Code Goals of Care: Advanced Directive information Advanced Directives 12/20/2017  Does Patient Have a Medical Advance Directive? Yes  Type of Advance Directive (No Data)  Does patient want to make changes to medical advance directive? No - Patient declined  Copy of [place] in Chart? No - copy requested  Would patient like information on creating a medical advance directive? -  Pre-existing out of facility DNR order (yellow form or pink MOST form) -      Chief Complaint  Patient presents with  . New Admit To SNF    New Admission Visit    HPI: Patient is a 83 y.o. male seen today for admission to SNF for therapy and IV antibiotics.for concerning Right Mastoid Osteomyelitis   Patient has h/o Diabetes Mellitus, Diastolic CHF,Hypertension, Chronic Atrial Fibrillation on Chronic Coumadin, HLD, CLL and osteomyelitis of right heel now healed.  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 He came to follow up with ENT with Severe Pain and per patient draining PUS from his ear. His BS during this time has also been fluctuating from 200- 500.   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 And is now discharged on 3 antibiotics for 6 weeks. He denies any fever or Chills. His Pain is controlled on Tylenol PRN. He does have severe Hearing loss in that ear and he cannot  wear his Hearing aide. His BS have also been fluctuating at home.His last A1C was 9.5. He is on high dose of Levemir, Glipizide and Actos and Victoza and Sliding scale insulin Patient did not have any c/o Fever or chills. Pain Controlled. Per his wife he has weak recently . And has lost almost 30 lbs.  Past Medical History:  Diagnosis Date  . Atrial fibrillation ([place])   . Chronic lymphocytic leukemia ([place])   . Coronary atherosclerosis of native coronary artery    Nonobstructive  . Essential hypertension, benign   . Hyperlipidemia   . IgA nephropathy    Dr. Lowanda Foster  . Type 2 diabetes mellitus ([place])   . UTI (urinary tract infection) july  10 th   taking  med    Past Surgical History:  Procedure Laterality Date  . AMPUTATION Left 09/15/2012   Procedure: PARTIAL AMPUTATION 3rd TOE LEFT FOOT;  Surgeon: Marcheta Grammes, DPM;  Location: [facility];  Service: Orthopedics;  Laterality: Left;  . AMPUTATION Left 04/20/2013   Procedure: PARTIAL AMPUTATION 2ND TOE LEFT FOOT;  Surgeon: Marcheta Grammes, DPM;  Location: [facility];  Service: Orthopedics;  Laterality: Left;  . ANAL FISSURE REPAIR    . APPLICATION OF WOUND VAC Right 09/09/2016   Procedure: APPLICATION OF WOUND VAC;  Surgeon: Caprice Beaver, DPM;  Location: [facility];  Service: Podiatry;  Laterality: Right;  . CATARACT EXTRACTION W/PHACO Right 09/30/2015   Procedure: CATARACT EXTRACTION PHACO AND INTRAOCULAR LENS PLACEMENT (IOC);  Surgeon: Tonny Branch, MD;  Location: [facility];  Service: Ophthalmology;  Laterality: Right;  CDE: 11.78  . CATARACT EXTRACTION W/PHACO Left 10/31/2015   Procedure: CATARACT EXTRACTION PHACO AND INTRAOCULAR LENS PLACEMENT LEFT EYE; CDE:  9.10;  Surgeon: Tonny Branch, MD;  Location: [facility];  Service: Ophthalmology;  Laterality: Left;  . COLONOSCOPY  08/19/2011   Procedure: COLONOSCOPY;  Surgeon: Rogene Houston, MD;  [facility];  Service: Endoscopy;  Laterality: N/A;  930  . COLONOSCOPY N/A 09/19/2014    Procedure: COLONOSCOPY;  Surgeon: Rogene Houston, MD;  [facility];  Service: Endoscopy;  Laterality: N/A;  1055  . FEMORAL HERNIA REPAIR    . INCISION AND DRAINAGE Right 09/09/2016   Procedure: DEBRIDEMENT WOUND RT heel with wound vac attachment;  Surgeon: Caprice Beaver, DPM;  Location: [facility];  Service: Podiatry;  Laterality: Right;  . INCISION AND DRAINAGE OF WOUND Right 08/12/2016   Procedure: DEBRIDEMENT WOUND RIGHT HEEL;  Surgeon: Caprice Beaver, DPM;  Location: [facility];  Service: Podiatry;  Laterality: Right;  right heel  . MASS EXCISION Right 09/28/2017   Procedure: EXCISION OF EXTERNAL AUDITORY CANAL MASS;  Surgeon: Leta Baptist, MD;  Location: [facility];  Service: ENT;  Laterality: Right;  . Open repair left quadriceps tendon.  08/22/07   Dr. Aline Brochure  . Right total knee replacement    . TRANSURETHRAL RESECTION OF PROSTATE    . TYMPANOMASTOIDECTOMY Right 11/10/2017   Procedure: RIGHT TYMPANOMASTOIDECTOMY;  Surgeon: Leta Baptist, MD;  Location: [facility];  Service: ENT;  Laterality: Right;  . WOUND EXPLORATION Right 08/12/2016   Procedure: EXPLORATION OF WOUND FOR FOREIGN BODY RIGHT HEEL;  Surgeon: Caprice Beaver, DPM;  Location: [facility];  Service: Podiatry;  Laterality: Right;  right heel    reports that he quit smoking about 54 years ago. His smoking use included cigarettes. He quit after 10.00 years of use. He has never used smokeless tobacco. He reports that he does not drink alcohol or use drugs. Social History   Socioeconomic History  . Marital status: Married    Spouse name: Not on file  . Number of children: Not on file  . Years of education: Not on file  . Highest education level: Not on file  Occupational History  . Occupation: Retired    Fish farm manager: RETIRED    Comment: [employer]  . Financial resource strain: Not on file  . Food insecurity:    Worry: Not on file    Inability: Not on file  . Transportation needs:      Medical: Not on file    Non-medical: Not on file  Tobacco Use  . Smoking status: Former Smoker    Years: 10.00    Types: Cigarettes    Last attempt to quit: 04/07/1963    Years since quitting: 54.7  . Smokeless tobacco: Never Used  Substance and Sexual Activity  . Alcohol use: No    Alcohol/week: 0.0 standard drinks  . Drug use: No  . Sexual activity: Not on file  Lifestyle  . Physical activity:    Days per week: Not on file    Minutes per session: Not on file  . Stress: Not on file  Relationships  . Social connections:    Talks on phone: Not on file    Gets together: Not on file    Attends religious service: Not on file    Active member of club or organization: Not on file    Attends meetings of clubs or  organizations: Not on file    Relationship status: Not on file  . Intimate partner violence:    Fear of current or ex partner: Not on file    Emotionally abused: Not on file    Physically abused: Not on file    Forced sexual activity: Not on file  Other Topics Concern  . Not on file  Social History Narrative  . Not on file    Functional Status Survey:    Family History  Problem Relation Age of Onset  . Atrial fibrillation Mother     Health Maintenance  Topic Date Due  . INFLUENZA VACCINE  01/19/2018 (Originally 11/04/2017)  . FOOT EXAM  01/19/2018 (Originally 10/14/1944)  . OPHTHALMOLOGY EXAM  01/19/2018 (Originally 10/14/1944)  . TETANUS/TDAP  01/19/2018 (Originally 10/14/1953)  . PNA vac Low Risk Adult (1 of 2 - PCV13) 01/19/2018 (Originally 10/13/2001)  . HEMOGLOBIN A1C  06/14/2018    Allergies  Allergen Reactions  . Erythromycin Other (See Comments)  . Penicillins Hives and Other (See Comments)    Has patient had a PCN reaction causing immediate rash, facial/tongue/throat swelling, SOB or lightheadedness with hypotension: No Has patient had a PCN reaction causing severe rash involving mucus membranes or skin necrosis: No Has patient had a PCN reaction  that required hospitalization No Has patient had a PCN reaction occurring within the last 10 years: No If all of the above answers are "NO", then may proceed with Cephalosporin use.  Has tolerated Rocephin    Allergies as of 12/20/2017      Reactions   Erythromycin Other (See Comments)   Penicillins Hives, Other (See Comments)   Has patient had a PCN reaction causing immediate rash, facial/tongue/throat swelling, SOB or lightheadedness with hypotension: No Has patient had a PCN reaction causing severe rash involving mucus membranes or skin necrosis: No Has patient had a PCN reaction that required hospitalization No Has patient had a PCN reaction occurring within the last 10 years: No If all of the above answers are "NO", then may proceed with Cephalosporin use. Has tolerated Rocephin      Medication List        Accurate as of 12/20/17 10:38 AM. Always use your most recent med list.          atorvastatin 10 MG tablet Commonly known as:  LIPITOR Take 10 mg by mouth every evening.   CALCIUM 600+D 600-800 MG-UNIT Tabs Generic drug:  Calcium Carb-Cholecalciferol Take 2 tablets by mouth daily with breakfast.   cefTRIAXone 2 g in sodium chloride 0.9 % 100 mL Inject 2 g into the vein daily. Last dose on 01/23/18   cholecalciferol 1000 units tablet Commonly known as:  VITAMIN D Take 1,000 Units by mouth every morning.   ciclopirox 0.77 % cream Commonly known as:  LOPROX Apply 1 application topically every morning. Applied to feet in the morning   Co Q-10 100 MG Caps Take 100 mg by mouth daily with breakfast.   digoxin 0.125 MG tablet Commonly known as:  LANOXIN Take 0.125 mg by mouth daily with breakfast.   finasteride 5 MG tablet Commonly known as:  PROSCAR Take 5 mg by mouth every morning.   Fish Oil 1200 MG Caps Take 2 capsules by mouth 2 (two) times daily.   furosemide 20 MG tablet Commonly known as:  LASIX Take 20 mg by mouth daily.   glimepiride 2 MG  tablet Commonly known as:  AMARYL Take 2 mg by mouth daily with breakfast.  NOVOLOG FLEXPEN 100 UNIT/ML FlexPen Generic drug:  insulin aspart Give per sliding scale at bedtime.   NOVOLOG FLEXPEN 100 UNIT/ML FlexPen Generic drug:  insulin aspart Give per sliding scale with meals.   insulin aspart 100 UNIT/ML FlexPen Commonly known as:  NOVOLOG Inject 10 Units into the skin 3 (three) times daily with meals.   insulin detemir 100 UNIT/ML injection Commonly known as:  LEVEMIR Inject 0.72 mLs (72 Units total) into the skin every morning.   lisinopril 20 MG tablet Commonly known as:  PRINIVIL,ZESTRIL Take 40 mg by mouth daily with supper.   LORazepam 1 MG tablet Commonly known as:  ATIVAN Take 1 mg by mouth at bedtime.   metroNIDAZOLE 500 MG tablet Commonly known as:  FLAGYL Take 1 tablet (500 mg total) by mouth every 8 (eight) hours. Last dose on 01/23/18   multivitamin with minerals Tabs tablet Take 1 tablet by mouth daily with breakfast.   pioglitazone 15 MG tablet Commonly known as:  ACTOS Take 15 mg by mouth every evening.   traMADol 50 MG tablet Commonly known as:  ULTRAM Take 1 tablet (50 mg total) by mouth every 6 (six) hours as needed for moderate pain.   vancomycin 1,250 mg in sodium chloride 0.9 % 250 mL Inject 1,250 mg into the vein every 12 (twelve) hours. Last dose on 01/23/18   VICTOZA 18 MG/3ML Sopn Generic drug:  liraglutide Inject 1.8 mg into the skin daily with breakfast.   vitamin C 1000 MG tablet Take 1,000 mg by mouth daily with breakfast.   vitamin E 400 UNIT capsule Take 400 Units by mouth every morning.   warfarin 7.5 MG tablet Commonly known as:  COUMADIN Take as directed by the anticoagulation clinic. If you are unsure how to take this medication, talk to your nurse or doctor. Original instructions:  Take 5-7.5 mg by mouth See admin instructions. 5mg  on Tuesdays and Fridays. Takes 7.5mg  on all other days       Review of Systems   Constitutional: Positive for activity change, appetite change and fatigue.  HENT: Positive for hearing loss.   Respiratory: Negative.   Cardiovascular: Positive for leg swelling.  Gastrointestinal: Negative.   Genitourinary: Negative.   Musculoskeletal: Negative.   Skin: Negative.   Neurological: Negative.   Psychiatric/Behavioral: Negative.     Vitals:   12/20/17 1038  BP: (!) 152/69  Pulse: 82  Resp: 18  Temp: 98.4 F (36.9 C)  TempSrc: Oral  SpO2: 96%   There is no height or weight on file to calculate BMI. Physical Exam  Constitutional: He is oriented to person, place, and time. He appears well-developed and well-nourished.  HENT:  Head: Normocephalic.  Mouth/Throat: Oropharynx is clear and moist.  Eyes: Pupils are equal, round, and reactive to light.  Neck: Neck supple.  Cardiovascular: Normal rate. An irregular rhythm present.  Pulmonary/Chest: Effort normal and breath sounds normal. No stridor. No respiratory distress. He has no wheezes.  Abdominal: Soft. Bowel sounds are normal. He exhibits no distension. There is no tenderness. There is no guarding.  Musculoskeletal:  Mild edema Bilateral with Chronic Venous changes  Neurological: He is alert and oriented to person, place, and time.  Walking with Gilford Rile . Right sided Droop due to Bells Palsy.  Skin: Skin is warm and dry.  Psychiatric: He has a normal mood and affect. His behavior is normal. Thought content normal.    Labs reviewed: Basic Metabolic Panel: Recent Labs    12/17/17 0527 12/18/17 0513  12/20/17 0720  NA 138 135 139  K 4.0 4.1 3.9  CL 100 100 103  CO2 28 28 27   GLUCOSE 223* 260* 141*  BUN 18 20 14   CREATININE 0.70 0.77 0.76  CALCIUM 9.1 8.8* 8.9   Liver Function Tests: Recent Labs    06/29/17 1400 11/04/17 2028 12/14/17 0527  AST 32 18 19  ALT 47 20 17  ALKPHOS 97 86 98  BILITOT 1.2 1.3* 0.8  PROT 6.1* 6.1* 6.2*  ALBUMIN 3.3* 3.5 3.1*   No results for input(s): LIPASE, AMYLASE  in the last 8760 hours. No results for input(s): AMMONIA in the last 8760 hours. CBC: Recent Labs    10/16/17 0132 11/04/17 2028  12/13/17 1431  12/17/17 0527 12/18/17 0513 12/20/17 0720  WBC 17.5* 12.1*   < > 19.5*   < > 15.8* 14.7* 16.8*  NEUTROABS 6.0 3.6  --  7.4  --   --   --   --   HGB 14.6 14.8   < > 13.6   < > 13.1 12.2* 12.4*  HCT 43.3 43.3   < > 40.9   < > 39.8 38.0* 36.4*  MCV 87.3 86.4   < > 87.0   < > 87.3 88.0 88.8  PLT 184 165   < > 301   < > 261 244 260   < > = values in this interval not displayed.   Cardiac Enzymes: Recent Labs    06/29/17 1400  TROPONINI 0.03*   BNP: Invalid input(s): POCBNP Lab Results  Component Value Date   HGBA1C 9.5 (H) 12/14/2017   No results found for: TSH No results found for: VITAMINB12 No results found for: FOLATE No results found for: IRON, TIBC, FERRITIN  Imaging and Procedures obtained prior to SNF admission: No results found.  Assessment/Plan  Acute mastoiditis  Patient on 3 antibiotics  Ceftriaxone, Vanco and Flogyl till 10/20 Will follow clinically Follow up with ENT Also Pharmacy to dose and follow BMP WBC slightly elevated today .  Facial nerve palsy Continue to use Patch and Keeping it mosit  Uncontrolled type 2 diabetes mellitus  On All his Meds including Insulin, Sliding Scale, Victoza, Actos and Glipizide Will continue to monitor.  Atrial fibrillation, chronic  INR therapeutic Continue Coumadin Repeat INR Rate Controlled on Digoxin  Chronic diastolic CHF (congestive heart failure)  Stable on Lasix and Lisinopril  Essential hypertension Controlled on lisinopril and Lasix     Family/ staff Communication:  Total time spent in this patient care encounter was 45_ minutes; greater than 50% of the visit spent counseling patient, reviewing records , Labs and coordinating care for problems addressed at this encounter.  Labs/tests ordered:

## 2017-12-22 ENCOUNTER — Encounter (HOSPITAL_COMMUNITY)
Admission: RE | Admit: 2017-12-22 | Discharge: 2017-12-22 | Disposition: A | Discharge status: Home / Self Care | Payer: Medicare Other | Source: Skilled Nursing Facility | Attending: Internal Medicine | Admitting: Internal Medicine

## 2017-12-22 LAB — BASIC METABOLIC PANEL
Anion gap: 8 (ref 5–15)
BUN: 14 mg/dL (ref 8–23)
CHLORIDE: 101 mmol/L (ref 98–111)
CO2: 28 mmol/L (ref 22–32)
CREATININE: 0.64 mg/dL (ref 0.61–1.24)
Calcium: 8.7 mg/dL — ABNORMAL LOW (ref 8.9–10.3)
GFR calc Af Amer: >60 mL/min (ref >60)
GFR calc non Af Amer: >60 mL/min (ref >60)
GLUCOSE: 150 mg/dL — AB (ref 70–99)
Potassium: 3.6 mmol/L (ref 3.5–5.1)
Sodium: 137 mmol/L (ref 135–145)

## 2017-12-22 LAB — VANCOMYCIN, TROUGH: Vancomycin Tr: 14 ug/mL — ABNORMAL LOW (ref 15–20)

## 2017-12-24 ENCOUNTER — Encounter (HOSPITAL_COMMUNITY)
Admission: RE | Admit: 2017-12-24 | Discharge: 2017-12-24 | Disposition: A | Discharge status: Home / Self Care | Payer: Medicare Other | Source: Skilled Nursing Facility | Attending: Internal Medicine | Admitting: Internal Medicine

## 2017-12-24 DIAGNOSIS — M868X8 Other osteomyelitis, other site: Secondary | ICD-10-CM | POA: Insufficient documentation

## 2017-12-24 LAB — VANCOMYCIN, TROUGH: Vancomycin Tr: 26 ug/mL (ref 15–20)

## 2017-12-27 ENCOUNTER — Non-Acute Institutional Stay (SKILLED_NURSING_FACILITY): Payer: Medicare Other | Admitting: Internal Medicine

## 2017-12-27 ENCOUNTER — Encounter (HOSPITAL_COMMUNITY)
Admission: RE | Admit: 2017-12-27 | Discharge: 2017-12-27 | Disposition: A | Discharge status: Home / Self Care | Payer: Medicare Other | Source: Skilled Nursing Facility | Attending: Internal Medicine | Admitting: Internal Medicine

## 2017-12-27 ENCOUNTER — Encounter: Payer: Self-pay | Admitting: Internal Medicine

## 2017-12-27 DIAGNOSIS — Z7901 Long term (current) use of anticoagulants: Secondary | ICD-10-CM | POA: Insufficient documentation

## 2017-12-27 DIAGNOSIS — I482 Chronic atrial fibrillation, unspecified: Secondary | ICD-10-CM

## 2017-12-27 DIAGNOSIS — Z48810 Encounter for surgical aftercare following surgery on the sense organs: Secondary | ICD-10-CM | POA: Insufficient documentation

## 2017-12-27 DIAGNOSIS — E162 Hypoglycemia, unspecified: Secondary | ICD-10-CM

## 2017-12-27 DIAGNOSIS — I5032 Chronic diastolic (congestive) heart failure: Secondary | ICD-10-CM

## 2017-12-27 DIAGNOSIS — M868X8 Other osteomyelitis, other site: Secondary | ICD-10-CM | POA: Insufficient documentation

## 2017-12-27 DIAGNOSIS — I110 Hypertensive heart disease with heart failure: Secondary | ICD-10-CM | POA: Insufficient documentation

## 2017-12-27 LAB — BASIC METABOLIC PANEL
Anion gap: 10 (ref 5–15)
BUN: 13 mg/dL (ref 8–23)
CHLORIDE: 99 mmol/L (ref 98–111)
CO2: 28 mmol/L (ref 22–32)
CREATININE: 0.74 mg/dL (ref 0.61–1.24)
Calcium: 9.2 mg/dL (ref 8.9–10.3)
GFR calc Af Amer: >60 mL/min (ref >60)
GFR calc non Af Amer: >60 mL/min (ref >60)
Glucose, Bld: 114 mg/dL — ABNORMAL HIGH (ref 70–99)
Potassium: 4.2 mmol/L (ref 3.5–5.1)
SODIUM: 137 mmol/L (ref 135–145)

## 2017-12-27 LAB — VANCOMYCIN, TROUGH: Vancomycin Tr: 17 ug/mL (ref 15–20)

## 2017-12-27 NOTE — Progress Notes (Signed)
Location:    [facility] Room Number: 158/P Place of Service:  SNF [phone]) Provider: Veleta Miners MD  [name], MD  Patient Care Team: [name], MD as PCP - General (Internal Medicine)  Extended Emergency Contact Information Primary Emergency Contact: Chandran,Norma Address: [address]          [place], [place] [address] Johnnette Litter of [place] Phone: [phone] Mobile Phone: [phone] Relation: Spouse  Code Status:  Full Code Goals of care: Advanced Directive information Advanced Directives 12/27/2017  Does Patient Have a Medical Advance Directive? Yes  Type of Advance Directive (No Data)  Does patient want to make changes to medical advance directive? No - Patient declined  Copy of [place] in Chart? No - copy requested  Would patient like information on creating a medical advance directive? -  Pre-existing out of facility DNR order (yellow form or pink MOST form) -     Chief Complaint  Patient presents with  . Acute Visit    Patient is being seen for Hypoglycemia     HPI:  Pt is a 83 y.o. male seen today for an acute visit for  Hypoglycemia with BS of 60 in evening around 4.-5 Pm. Patient is in SNF for therapy and IV antibiotics.for concerning Right Mastoid Osteomyelitis   Patient has h/o Diabetes Mellitus, Diastolic CHF,Hypertension, Chronic Atrial Fibrillation on Chronic Coumadin, HLD, CLL and osteomyelitis of right heel now healed.  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 He came to follow up with ENT with Severe Pain and per patient draining PUS from his ear. His BS during this time has also been fluctuating from 200- 500.   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 And is now discharged on 3 antibiotics for 6 weeks. Since his A1C was 9.5 he dose of Levimir was  increased and was started on Sliding scale and Bolus Novolog insulin. Patient has been having Low BS around 4 pm. He gets 10 units of bolus Novolog and Sliding scale also. His fasting BS are b/w 110-120. He is staying afebrile. Though he did have cough over the weekend but his Chest Xray was negative.   Past Medical History:  Diagnosis Date  . Atrial fibrillation ([place])   . Chronic lymphocytic leukemia ([place])   . Coronary atherosclerosis of native coronary artery    Nonobstructive  . Essential hypertension, benign   . Hyperlipidemia   . IgA nephropathy    Dr. Lowanda Foster  . Type 2 diabetes mellitus ([place])   . UTI (urinary tract infection) july  10 th   taking  med    Past Surgical History:  Procedure Laterality Date  . AMPUTATION Left 09/15/2012   Procedure: PARTIAL AMPUTATION 3rd TOE LEFT FOOT;  Surgeon: Marcheta Grammes, DPM;  Location: [facility];  Service: Orthopedics;  Laterality: Left;  . AMPUTATION Left 04/20/2013   Procedure: PARTIAL AMPUTATION 2ND TOE LEFT FOOT;  Surgeon: Marcheta Grammes, DPM;  Location: [facility];  Service: Orthopedics;  Laterality: Left;  . ANAL FISSURE REPAIR    . APPLICATION OF WOUND VAC Right 09/09/2016   Procedure: APPLICATION OF WOUND VAC;  Surgeon: Caprice Beaver, DPM;  Location: [facility];  Service: Podiatry;  Laterality: Right;  . CATARACT EXTRACTION W/PHACO Right 09/30/2015   Procedure: CATARACT EXTRACTION PHACO AND INTRAOCULAR LENS PLACEMENT (IOC);  Surgeon: Tonny Branch, MD;  Location: [facility];  Service: Ophthalmology;  Laterality: Right;  CDE: 11.78  . CATARACT EXTRACTION W/PHACO Left 10/31/2015   Procedure: CATARACT EXTRACTION PHACO AND INTRAOCULAR LENS PLACEMENT LEFT EYE; CDE:  9.10;  Surgeon: Tonny Branch, MD;  Location: [facility];  Service: Ophthalmology;  Laterality: Left;  . COLONOSCOPY  08/19/2011   Procedure: COLONOSCOPY;  Surgeon: Rogene Houston, MD;  [facility];  Service: Endoscopy;  Laterality: N/A;  930  . COLONOSCOPY N/A 09/19/2014    Procedure: COLONOSCOPY;  Surgeon: Rogene Houston, MD;  [facility];  Service: Endoscopy;  Laterality: N/A;  1055  . FEMORAL HERNIA REPAIR    . INCISION AND DRAINAGE Right 09/09/2016   Procedure: DEBRIDEMENT WOUND RT heel with wound vac attachment;  Surgeon: Caprice Beaver, DPM;  Location: [facility];  Service: Podiatry;  Laterality: Right;  . INCISION AND DRAINAGE OF WOUND Right 08/12/2016   Procedure: DEBRIDEMENT WOUND RIGHT HEEL;  Surgeon: Caprice Beaver, DPM;  Location: [facility];  Service: Podiatry;  Laterality: Right;  right heel  . MASS EXCISION Right 09/28/2017   Procedure: EXCISION OF EXTERNAL AUDITORY CANAL MASS;  Surgeon: Leta Baptist, MD;  Location: [facility];  Service: ENT;  Laterality: Right;  . Open repair left quadriceps tendon.  08/22/07   Dr. Aline Brochure  . Right total knee replacement    . TRANSURETHRAL RESECTION OF PROSTATE    . TYMPANOMASTOIDECTOMY Right 11/10/2017   Procedure: RIGHT TYMPANOMASTOIDECTOMY;  Surgeon: Leta Baptist, MD;  Location: [facility];  Service: ENT;  Laterality: Right;  . WOUND EXPLORATION Right 08/12/2016   Procedure: EXPLORATION OF WOUND FOR FOREIGN BODY RIGHT HEEL;  Surgeon: Caprice Beaver, DPM;  Location: [facility];  Service: Podiatry;  Laterality: Right;  right heel    Allergies  Allergen Reactions  . Erythromycin Other (See Comments)  . Penicillins Hives and Other (See Comments)    Has patient had a PCN reaction causing immediate rash, facial/tongue/throat swelling, SOB or lightheadedness with hypotension: No Has patient had a PCN reaction causing severe rash involving mucus membranes or skin necrosis: No Has patient had a PCN reaction that required hospitalization No Has patient had a PCN reaction occurring within the last 10 years: No If all of the above answers are "NO", then may proceed with Cephalosporin use.  Has tolerated Rocephin    Outpatient Encounter Medications as of 12/27/2017  Medication Sig  .  Ascorbic Acid (VITAMIN C) 1000 MG tablet Take 1,000 mg by mouth daily with breakfast.   . atorvastatin (LIPITOR) 10 MG tablet Take 10 mg by mouth every evening.  . Calcium Carb-Cholecalciferol (CALCIUM 600+D) 600-800 MG-UNIT TABS Take 2 tablets by mouth daily with breakfast.  . cefTRIAXone 2 g in sodium chloride 0.9 % 100 mL Inject 2 g into the vein daily. Last dose on 01/23/18  . cholecalciferol (VITAMIN D) 1000 units tablet Take 1,000 Units by mouth every morning.  . ciclopirox (LOPROX) 0.77 % cream Apply 1 application topically every morning. Applied to feet in the morning  . Coenzyme Q10 (CO Q-10) 100 MG CAPS Take 100 mg by mouth daily with breakfast.   . digoxin (LANOXIN) 0.125 MG tablet Take 0.125 mg by mouth daily with breakfast.   . finasteride (PROSCAR) 5 MG tablet Take 5 mg by mouth every morning.   . furosemide (LASIX) 20 MG tablet Take 20 mg by mouth daily.  Marland Kitchen glimepiride (AMARYL) 2 MG tablet Take 2 mg by mouth daily with breakfast.   . guaiFENesin ([place])  600 MG 12 hr tablet Take 600 mg by mouth 2 (two) times daily.  . insulin aspart (NOVOLOG FLEXPEN) 100 UNIT/ML FlexPen Inject 10 Units into the skin 3 (three) times daily with meals.  . insulin aspart (NOVOLOG FLEXPEN) 100 UNIT/ML FlexPen Give per sliding scale at bedtime.  . insulin aspart (NOVOLOG FLEXPEN) 100 UNIT/ML FlexPen Give per sliding scale with meals.  . insulin detemir (LEVEMIR) 100 UNIT/ML injection Inject 0.72 mLs (72 Units total) into the skin every morning.  Marland Kitchen lisinopril (PRINIVIL,ZESTRIL) 20 MG tablet Take 40 mg by mouth daily with supper.  Marland Kitchen LORazepam (ATIVAN) 1 MG tablet Take 1 tablet (1 mg total) by mouth at bedtime.  . metroNIDAZOLE (FLAGYL) 500 MG tablet Take 1 tablet (500 mg total) by mouth every 8 (eight) hours. Last dose on 01/23/18  . Multiple Vitamin (MULTIVITAMIN WITH MINERALS) TABS tablet Take 1 tablet by mouth daily with breakfast.  . Omega-3 Fatty Acids (FISH OIL) 1200 MG CAPS Take 2 capsules by mouth  2 (two) times daily.  . pioglitazone (ACTOS) 15 MG tablet Take 15 mg by mouth every evening.   . traMADol (ULTRAM) 50 MG tablet Take 1 tablet (50 mg total) by mouth every 6 (six) hours as needed for moderate pain.  . vancomycin 1,250 mg in sodium chloride 0.9 % 250 mL Inject 1,250 mg into the vein every 12 (twelve) hours. Last dose on 01/23/18  . VICTOZA 18 MG/3ML SOPN Inject 1.8 mg into the skin daily with breakfast.  . vitamin E 400 UNIT capsule Take 400 Units by mouth every morning.  . warfarin (COUMADIN) 7.5 MG tablet Take 5-7.5 mg by mouth See admin instructions. 5mg  on Tuesdays and Fridays. Takes 7.5mg  on all other days   No facility-administered encounter medications on file as of 12/27/2017.      Review of Systems  Constitutional: Negative.   HENT: Positive for congestion.   Respiratory: Positive for cough. Negative for shortness of breath.   Cardiovascular: Negative.   Gastrointestinal: Negative.   Genitourinary: Negative.   Musculoskeletal: Negative.   Neurological: Negative.   Psychiatric/Behavioral: Negative.      There is no immunization history on file for this patient. Pertinent  Health Maintenance Due  Topic Date Due  . INFLUENZA VACCINE  01/19/2018 (Originally 11/04/2017)  . FOOT EXAM  01/19/2018 (Originally 10/14/1944)  . OPHTHALMOLOGY EXAM  01/19/2018 (Originally 10/14/1944)  . PNA vac Low Risk Adult (1 of 2 - PCV13) 01/19/2018 (Originally 10/13/2001)  . URINE MICROALBUMIN  01/26/2018 (Originally 10/14/1944)  . HEMOGLOBIN A1C  06/14/2018   No flowsheet data found. Functional Status Survey:    Vitals:   12/27/17 1431  BP: 134/82  Pulse: 76  Resp: 20  Temp: 98 F (36.7 C)  TempSrc: Oral   There is no height or weight on file to calculate BMI. Physical Exam  Constitutional: He is oriented to person, place, and time. He appears well-developed and well-nourished.  HENT:  Head: Normocephalic.  Mouth/Throat: Oropharynx is clear and moist.  Eyes: Pupils are  equal, round, and reactive to light.  Neck: Neck supple.  Cardiovascular: Normal rate. An irregular rhythm present.  Pulmonary/Chest: Effort normal and breath sounds normal. No stridor. No respiratory distress. He has no wheezes.  Abdominal: Soft. Bowel sounds are normal. He exhibits no distension. There is no tenderness. There is no guarding.  Musculoskeletal:  Mild edema Bilateral with Chronic Venous changes  Neurological: He is alert and oriented to person, place, and time.  Walking with Gilford Rile . Right  sided Droop due to [place].  Skin: Skin is warm and dry.  Psychiatric: He has a normal mood and affect. His behavior is normal. Thought content normal.    Labs reviewed: Recent Labs    12/20/17 0720 12/22/17 0738 12/27/17 0900  NA 139 137 137  K 3.9 3.6 4.2  CL 103 101 99  CO2 27 28 28   GLUCOSE 141* 150* 114*  BUN 14 14 13   CREATININE 0.76 0.64 0.74  CALCIUM 8.9 8.7* 9.2   Recent Labs    06/29/17 1400 11/04/17 2028 12/14/17 0527  AST 32 18 19  ALT 47 20 17  ALKPHOS 97 86 98  BILITOT 1.2 1.3* 0.8  PROT 6.1* 6.1* 6.2*  ALBUMIN 3.3* 3.5 3.1*   Recent Labs    10/16/17 0132 11/04/17 2028  12/13/17 1431  12/17/17 0527 12/18/17 0513 12/20/17 0720  WBC 17.5* 12.1*   < > 19.5*   < > 15.8* 14.7* 16.8*  NEUTROABS 6.0 3.6  --  7.4  --   --   --   --   HGB 14.6 14.8   < > 13.6   < > 13.1 12.2* 12.4*  HCT 43.3 43.3   < > 40.9   < > 39.8 38.0* 36.4*  MCV 87.3 86.4   < > 87.0   < > 87.3 88.0 88.8  PLT 184 165   < > 301   < > 261 244 260   < > = values in this interval not displayed.   No results found for: TSH Lab Results  Component Value Date   HGBA1C 9.5 (H) 12/14/2017   Lab Results  Component Value Date   CHOL 124 10/06/2016   HDL 23 (L) 10/06/2016   LDLCALC 71 10/06/2016   TRIG 150 (H) 10/06/2016   CHOLHDL 5.4 10/06/2016    Significant Diagnostic Results in last 30 days:  Dg Chest Port 1 View  Result Date: 12/16/2017 CLINICAL DATA:  PICC line  placement. EXAM: PORTABLE CHEST 1 VIEW COMPARISON:  06/29/2016 FINDINGS: Right PICC line terminates at the expected location of proximal superior vena cava. Enlarged cardiac silhouette. Calcific atherosclerotic disease of the aorta. There is no evidence of pneumothorax. Coarsening of the interstitium. Osseous structures are without acute abnormality. Soft tissues are grossly normal. IMPRESSION: Right PICC line terminates at the expected location of proximal superior vena cava. No evidence of pneumothorax. Electronically Signed   By: Fidela Salisbury M.D.   On: 12/16/2017 12:55    Assessment/Plan  Hypoglycemia Discontinue Sliding scale insulin.  Continue on Levemir, Actos, and Amaryl Also Continue on Novolog Bolus. Decreased to 8 units BID  Acute mastoiditis  Patient on 3 antibiotics  Ceftriaxone, Vanco and Flogyl till 10/20 Clinically doing well. With No fever  Follow up with ENT Repeat CBC  Facial nerve palsy Continue to use Patch and Keeping it mosit   Atrial fibrillation, chronic  INR therapeutic Continue Coumadin Repeat INR Rate Controlled on Digoxin  Chronic diastolic CHF (congestive heart failure)  Stable on Lasix and Lisinopril  Essential hypertension Controlled on lisinopril and Lasix    Family/ staff Communication:   Labs/tests ordered:

## 2017-12-28 ENCOUNTER — Non-Acute Institutional Stay (SKILLED_NURSING_FACILITY): Payer: Medicare Other | Admitting: Internal Medicine

## 2017-12-28 ENCOUNTER — Encounter (HOSPITAL_COMMUNITY)
Admission: RE | Admit: 2017-12-28 | Discharge: 2017-12-28 | Disposition: A | Discharge status: Home / Self Care | Payer: Medicare Other | Source: Skilled Nursing Facility | Attending: Internal Medicine | Admitting: Internal Medicine

## 2017-12-28 ENCOUNTER — Encounter: Payer: Self-pay | Admitting: Internal Medicine

## 2017-12-28 DIAGNOSIS — I482 Chronic atrial fibrillation, unspecified: Secondary | ICD-10-CM

## 2017-12-28 DIAGNOSIS — H7091 Unspecified mastoiditis, right ear: Secondary | ICD-10-CM

## 2017-12-28 DIAGNOSIS — E1165 Type 2 diabetes mellitus with hyperglycemia: Secondary | ICD-10-CM

## 2017-12-28 DIAGNOSIS — I5032 Chronic diastolic (congestive) heart failure: Secondary | ICD-10-CM

## 2017-12-28 DIAGNOSIS — Z794 Long term (current) use of insulin: Secondary | ICD-10-CM | POA: Diagnosis not present

## 2017-12-28 LAB — PROTIME-INR
INR: 5.34
Prothrombin Time: 48.5 seconds — ABNORMAL HIGH (ref 11.4–15.2)

## 2017-12-28 NOTE — Progress Notes (Signed)
Location:    [facility] Room Number: 158/P Place of Service:  SNF [id number]) Provider: Veleta Miners MD  [name], MD  Patient Care Team: [name], MD as PCP - General (Internal Medicine)  Extended Emergency Contact Information Primary Emergency Contact: Colville,Norma Address: [address]          [place], [place] [address] Johnnette Litter of [place] Phone: [phone] Mobile Phone: [phone] Relation: Spouse  Code Status:  Full Code Goals of care: Advanced Directive information Advanced Directives 12/28/2017  Does Patient Have a Medical Advance Directive? Yes  Type of Advance Directive (No Data)  Does patient want to make changes to medical advance directive? No - Patient declined  Copy of [place] in Chart? No - copy requested  Would patient like information on creating a medical advance directive? -  Pre-existing out of facility DNR order (yellow form or pink MOST form) -     Chief Complaint  Patient presents with  . Acute Visit    Patient is being seen for CHF, And Insulin Management and Incrased INR    HPI:  Pt is a 83 y.o. male seen today for an acute visit for Continuous Cough and Hypoglycemia mostly late Afternoon. Patient is in SNF for therapy and IV antibiotics.for concerning Right Mastoid Osteomyelitis   Patient has h/o Diabetes Mellitus, Diastolic CHF,Hypertension, Chronic Atrial Fibrillation on Chronic Coumadin, HLD, CLL and osteomyelitis of right heel now healed.  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 He came to follow up with ENT with Severe Pain and per patient draining PUS from his ear. His BS during this time has also been fluctuating from 200- 500.  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 And is now discharged on 3 antibiotics for 6 weeks. Since his A1C was 9.5  he dose of Levimir was increased and was started on Sliding scale and Bolus Novolog insulin.He is also on Amaryl, Actos and Victoza I had reduced his bolus NovoLog yesterday.  But patient continues to have low blood sugars around 60s late afternoon. Patient also have some cough and was started on Mucinex over the weekend.  His chest x-ray was reported as negative but final report today states CHF. Per patient's wife he usually takes 40 mg of Lasix which was reduced to 20 due to his decreased p.o. Intake. Patient complains of cough denies any shortness of breath His INR is also elevated today No Signs of bleeing  Past Medical History:  Diagnosis Date  . Atrial fibrillation ([facility])   . Chronic lymphocytic leukemia ([facility])   . Coronary atherosclerosis of native coronary artery    Nonobstructive  . Essential hypertension, benign   . Hyperlipidemia   . IgA nephropathy    Dr. Lowanda Foster  . Type 2 diabetes mellitus ([facility])   . UTI (urinary tract infection) july  10 th   taking  med    Past Surgical History:  Procedure Laterality Date  . AMPUTATION Left 09/15/2012   Procedure: PARTIAL AMPUTATION 3rd TOE LEFT FOOT;  Surgeon: Marcheta Grammes, DPM;  Location: [facility];  Service: Orthopedics;  Laterality: Left;  . AMPUTATION Left 04/20/2013   Procedure: PARTIAL AMPUTATION 2ND TOE LEFT FOOT;  Surgeon: Marcheta Grammes, DPM;  Location: [facility];  Service: Orthopedics;  Laterality: Left;  . ANAL FISSURE REPAIR    . APPLICATION OF WOUND VAC Right 09/09/2016  Procedure: APPLICATION OF WOUND VAC;  Surgeon: Caprice Beaver, DPM;  Location: [facility];  Service: Podiatry;  Laterality: Right;  . CATARACT EXTRACTION W/PHACO Right 09/30/2015   Procedure: CATARACT EXTRACTION PHACO AND INTRAOCULAR LENS PLACEMENT (IOC);  Surgeon: Tonny Branch, MD;  Location: [facility];  Service: Ophthalmology;  Laterality: Right;  CDE: 11.78  . CATARACT EXTRACTION W/PHACO Left 10/31/2015   Procedure: CATARACT EXTRACTION PHACO AND  INTRAOCULAR LENS PLACEMENT LEFT EYE; CDE:  9.10;  Surgeon: Tonny Branch, MD;  Location: [facility];  Service: Ophthalmology;  Laterality: Left;  . COLONOSCOPY  08/19/2011   Procedure: COLONOSCOPY;  Surgeon: Rogene Houston, MD;  [facility];  Service: Endoscopy;  Laterality: N/A;  930  . COLONOSCOPY N/A 09/19/2014   Procedure: COLONOSCOPY;  Surgeon: Rogene Houston, MD;  [facility];  Service: Endoscopy;  Laterality: N/A;  1055  . FEMORAL HERNIA REPAIR    . INCISION AND DRAINAGE Right 09/09/2016   Procedure: DEBRIDEMENT WOUND RT heel with wound vac attachment;  Surgeon: Caprice Beaver, DPM;  Location: [facility];  Service: Podiatry;  Laterality: Right;  . INCISION AND DRAINAGE OF WOUND Right 08/12/2016   Procedure: DEBRIDEMENT WOUND RIGHT HEEL;  Surgeon: Caprice Beaver, DPM;  Location: [facility];  Service: Podiatry;  Laterality: Right;  right heel  . MASS EXCISION Right 09/28/2017   Procedure: EXCISION OF EXTERNAL AUDITORY CANAL MASS;  Surgeon: Leta Baptist, MD;  Location: [facility];  Service: ENT;  Laterality: Right;  . Open repair left quadriceps tendon.  08/22/07   Dr. Aline Brochure  . Right total knee replacement    . TRANSURETHRAL RESECTION OF PROSTATE    . TYMPANOMASTOIDECTOMY Right 11/10/2017   Procedure: RIGHT TYMPANOMASTOIDECTOMY;  Surgeon: Leta Baptist, MD;  Location: [facility];  Service: ENT;  Laterality: Right;  . WOUND EXPLORATION Right 08/12/2016   Procedure: EXPLORATION OF WOUND FOR FOREIGN BODY RIGHT HEEL;  Surgeon: Caprice Beaver, DPM;  Location: [facility];  Service: Podiatry;  Laterality: Right;  right heel    Allergies  Allergen Reactions  . Erythromycin Other (See Comments)  . Penicillins Hives and Other (See Comments)    Has patient had a PCN reaction causing immediate rash, facial/tongue/throat swelling, SOB or lightheadedness with hypotension: No Has patient had a PCN reaction causing severe rash involving mucus membranes or skin necrosis:  No Has patient had a PCN reaction that required hospitalization No Has patient had a PCN reaction occurring within the last 10 years: No If all of the above answers are "NO", then may proceed with Cephalosporin use.  Has tolerated Rocephin    Outpatient Encounter Medications as of 12/28/2017  Medication Sig  . Ascorbic Acid (VITAMIN C) 1000 MG tablet Take 1,000 mg by mouth daily with breakfast.   . atorvastatin (LIPITOR) 10 MG tablet Take 10 mg by mouth every evening.  . Calcium Carb-Cholecalciferol (CALCIUM 600+D) 600-800 MG-UNIT TABS Take 2 tablets by mouth daily with breakfast.  . cefTRIAXone 2 g in sodium chloride 0.9 % 100 mL Inject 2 g into the vein daily. Last dose on 01/23/18  . cholecalciferol (VITAMIN D) 1000 units tablet Take 1,000 Units by mouth every morning.  . ciclopirox (LOPROX) 0.77 % cream Apply 1 application topically every morning. Applied to feet in the morning  . Coenzyme Q10 (CO Q-10) 100 MG CAPS Take 100 mg by mouth daily with breakfast.   . digoxin (LANOXIN) 0.125 MG tablet Take 0.125 mg by mouth daily with breakfast.   .  finasteride (PROSCAR) 5 MG tablet Take 5 mg by mouth every morning.   . furosemide (LASIX) 20 MG tablet Take 20 mg by mouth daily.  Marland Kitchen glimepiride (AMARYL) 2 MG tablet Take 2 mg by mouth daily with breakfast.   . guaiFENesin (MUCINEX) 600 MG 12 hr tablet Take 600 mg by mouth 2 (two) times daily.  . insulin aspart (NOVOLOG FLEXPEN) 100 UNIT/ML FlexPen Inject 8 Units into the skin 2 (two) times daily.  . insulin detemir (LEVEMIR) 100 UNIT/ML injection Inject 0.72 mLs (72 Units total) into the skin every morning.  Marland Kitchen lisinopril (PRINIVIL,ZESTRIL) 20 MG tablet Take 40 mg by mouth daily with supper.  Marland Kitchen LORazepam (ATIVAN) 1 MG tablet Take 1 tablet (1 mg total) by mouth at bedtime.  . metroNIDAZOLE (FLAGYL) 500 MG tablet Take 1 tablet (500 mg total) by mouth every 8 (eight) hours. Last dose on 01/23/18  . Multiple Vitamin (MULTIVITAMIN WITH MINERALS) TABS  tablet Take 1 tablet by mouth daily with breakfast.  . Omega-3 Fatty Acids (FISH OIL) 1200 MG CAPS Take 2 capsules by mouth 2 (two) times daily.  . pioglitazone (ACTOS) 15 MG tablet Take 15 mg by mouth every evening.   . traMADol (ULTRAM) 50 MG tablet Take 1 tablet (50 mg total) by mouth every 6 (six) hours as needed for moderate pain.  . vancomycin 1,250 mg in sodium chloride 0.9 % 250 mL Inject 1,250 mg into the vein every 12 (twelve) hours. Last dose on 01/23/18  . VICTOZA 18 MG/3ML SOPN Inject 1.8 mg into the skin daily with breakfast.  . vitamin E 400 UNIT capsule Take 400 Units by mouth every morning.  . [DISCONTINUED] insulin aspart (NOVOLOG FLEXPEN) 100 UNIT/ML FlexPen Inject 10 Units into the skin 3 (three) times daily with meals. (Patient taking differently: Inject 8 Units into the skin 2 (two) times daily. )  . [DISCONTINUED] insulin aspart (NOVOLOG FLEXPEN) 100 UNIT/ML FlexPen Give per sliding scale at bedtime.  . [DISCONTINUED] insulin aspart (NOVOLOG FLEXPEN) 100 UNIT/ML FlexPen Give per sliding scale with meals.  . [DISCONTINUED] warfarin (COUMADIN) 7.5 MG tablet Take 5-7.5 mg by mouth See admin instructions. 5mg  on Tuesdays and Fridays. Takes 7.5mg  on all other days   No facility-administered encounter medications on file as of 12/28/2017.      Review of Systems  Constitutional: Negative.   HENT: Positive for congestion.   Respiratory: Positive for cough. Negative for shortness of breath.   Cardiovascular: Negative.   Gastrointestinal: Negative.   Genitourinary: Negative.   Musculoskeletal: Negative.   Skin: Negative.   Neurological: Negative.   Psychiatric/Behavioral: Negative.      There is no immunization history on file for this patient. Pertinent  Health Maintenance Due  Topic Date Due  . INFLUENZA VACCINE  01/19/2018 (Originally 11/04/2017)  . FOOT EXAM  01/19/2018 (Originally 10/14/1944)  . OPHTHALMOLOGY EXAM  01/19/2018 (Originally 10/14/1944)  . PNA vac Low  Risk Adult (1 of 2 - PCV13) 01/19/2018 (Originally 10/13/2001)  . URINE MICROALBUMIN  01/26/2018 (Originally 10/14/1944)  . HEMOGLOBIN A1C  06/14/2018   No flowsheet data found. Functional Status Survey:    Vitals:   12/28/17 1350  BP: (!) 165/82  Pulse: 94  Resp: 20  Temp: 98.2 F (36.8 C)  TempSrc: Oral  SpO2: 96%   There is no height or weight on file to calculate BMI. Physical Exam  Constitutional: He is oriented to person, place, and time. He appears well-developed and well-nourished.  HENT:  Head: Normocephalic.  Mouth/Throat:  Oropharynx is clear and moist.  Eyes: Pupils are equal, round, and reactive to light.  Neck: Neck supple.  Cardiovascular: Normal rate. An irregular rhythm present.  Pulmonary/Chest: Effort normal and breath sounds normal. No stridor. No respiratory distress. He has no wheezes.  Abdominal: Soft. Bowel sounds are normal. He exhibits no distension. There is no tenderness. There is no guarding.  Musculoskeletal:  Mild edema Bilateral with Chronic Venous changes  Neurological: He is alert and oriented to person, place, and time.  Walking with Gilford Rile . Right sided facila Droop due to Bells Palsy.  Skin: Skin is warm and dry.  Psychiatric: He has a normal mood and affect. His behavior is normal. Thought content normal.    Labs reviewed: Recent Labs    12/20/17 0720 12/22/17 0738 12/27/17 0900  NA 139 137 137  K 3.9 3.6 4.2  CL 103 101 99  CO2 27 28 28   GLUCOSE 141* 150* 114*  BUN 14 14 13   CREATININE 0.76 0.64 0.74  CALCIUM 8.9 8.7* 9.2   Recent Labs    06/29/17 1400 11/04/17 2028 12/14/17 0527  AST 32 18 19  ALT 47 20 17  ALKPHOS 97 86 98  BILITOT 1.2 1.3* 0.8  PROT 6.1* 6.1* 6.2*  ALBUMIN 3.3* 3.5 3.1*   Recent Labs    10/16/17 0132 11/04/17 2028  12/13/17 1431  12/17/17 0527 12/18/17 0513 12/20/17 0720  WBC 17.5* 12.1*   < > 19.5*   < > 15.8* 14.7* 16.8*  NEUTROABS 6.0 3.6  --  7.4  --   --   --   --   HGB 14.6 14.8   <  > 13.6   < > 13.1 12.2* 12.4*  HCT 43.3 43.3   < > 40.9   < > 39.8 38.0* 36.4*  MCV 87.3 86.4   < > 87.0   < > 87.3 88.0 88.8  PLT 184 165   < > 301   < > 261 244 260   < > = values in this interval not displayed.   No results found for: TSH Lab Results  Component Value Date   HGBA1C 9.5 (H) 12/14/2017   Lab Results  Component Value Date   CHOL 124 10/06/2016   HDL 23 (L) 10/06/2016   LDLCALC 71 10/06/2016   TRIG 150 (H) 10/06/2016   CHOLHDL 5.4 10/06/2016    Significant Diagnostic Results in last 30 days:  Dg Chest Port 1 View  Result Date: 12/16/2017 CLINICAL DATA:  PICC line placement. EXAM: PORTABLE CHEST 1 VIEW COMPARISON:  06/29/2016 FINDINGS: Right PICC line terminates at the expected location of proximal superior vena cava. Enlarged cardiac silhouette. Calcific atherosclerotic disease of the aorta. There is no evidence of pneumothorax. Coarsening of the interstitium. Osseous structures are without acute abnormality. Soft tissues are grossly normal. IMPRESSION: Right PICC line terminates at the expected location of proximal superior vena cava. No evidence of pneumothorax. Electronically Signed   By: Fidela Salisbury M.D.   On: 12/16/2017 12:55    Assessment/Plan  Diabetes mellitus Will Decrease his Levemir to 65 units from 72 Discontinue sliding scale insulin Continue bolus NovoLog 8 units if CBG more than 150 for lunch and supper We will also decrease the dose of Amaryl to 1 mg Continue Actos and Victoza Continue Accu-Cheks History of CHF with LVEF of 65% in 05/19 Patient little reluctant to increase his Lasix as he says he has to go to the bathroom very frequently. We will increase his  Lasix to 40 mg for 5 days and then back to 20 mg Continue potassium Repeat BMP Acute mastoiditis Patient on 3 antibiotics  Ceftriaxone, Vanco and Flogyl till 10/20 Clinically doing well. With No fever  Follow up with ENT Repeat CBC  Facial nerve palsy Continue to use Patch  and Keeping it mosit  Atrial fibrillation, chronic On Chronic Coumadin His INR was 5 today Was  elevated due to antibiotics Hold Coumadin Will get follow-up INR Chronic diastolic CHF (congestive heart failure) Stable on Lasix and Lisinopril  Essential hypertension Controlled on lisinopril and Lasix   Family/ staff Communication: Wife in the room  Labs/tests ordered: BMP and CbC and INR  Total time spent in this patient care encounter was 45_ minutes; greater than 50% of the visit spent counseling patient, reviewing records , Labs and coordinating care for problems addressed at this encounter.

## 2017-12-30 ENCOUNTER — Non-Acute Institutional Stay (SKILLED_NURSING_FACILITY): Payer: Medicare Other | Admitting: Internal Medicine

## 2017-12-30 ENCOUNTER — Encounter (HOSPITAL_COMMUNITY)
Admission: RE | Admit: 2017-12-30 | Discharge: 2017-12-30 | Disposition: A | Discharge status: Home / Self Care | Payer: Medicare Other | Source: Skilled Nursing Facility | Attending: Internal Medicine | Admitting: Internal Medicine

## 2017-12-30 ENCOUNTER — Encounter: Payer: Self-pay | Admitting: Internal Medicine

## 2017-12-30 DIAGNOSIS — I5032 Chronic diastolic (congestive) heart failure: Secondary | ICD-10-CM | POA: Diagnosis not present

## 2017-12-30 DIAGNOSIS — H70001 Acute mastoiditis without complications, right ear: Secondary | ICD-10-CM | POA: Diagnosis not present

## 2017-12-30 DIAGNOSIS — I4891 Unspecified atrial fibrillation: Secondary | ICD-10-CM | POA: Insufficient documentation

## 2017-12-30 DIAGNOSIS — Z794 Long term (current) use of insulin: Secondary | ICD-10-CM

## 2017-12-30 DIAGNOSIS — I482 Chronic atrial fibrillation, unspecified: Secondary | ICD-10-CM

## 2017-12-30 DIAGNOSIS — M868X8 Other osteomyelitis, other site: Secondary | ICD-10-CM | POA: Insufficient documentation

## 2017-12-30 DIAGNOSIS — E1165 Type 2 diabetes mellitus with hyperglycemia: Secondary | ICD-10-CM | POA: Diagnosis not present

## 2017-12-30 DIAGNOSIS — Z48810 Encounter for surgical aftercare following surgery on the sense organs: Secondary | ICD-10-CM | POA: Insufficient documentation

## 2017-12-30 DIAGNOSIS — I69822 Dysarthria following other cerebrovascular disease: Secondary | ICD-10-CM | POA: Insufficient documentation

## 2017-12-30 LAB — PROTIME-INR
INR: 3.53
Prothrombin Time: 35.1 seconds — ABNORMAL HIGH (ref 11.4–15.2)

## 2017-12-30 LAB — CBC WITH DIFFERENTIAL/PLATELET
Basophils Absolute: 0 10*3/uL (ref 0.0–0.1)
Basophils Relative: 0 %
Eosinophils Absolute: 0.3 10*3/uL (ref 0.0–0.7)
Eosinophils Relative: 2 %
HEMATOCRIT: 36.4 % — AB (ref 39.0–52.0)
HEMOGLOBIN: 12.1 g/dL — AB (ref 13.0–17.0)
LYMPHS PCT: 51 %
Lymphs Abs: 6.7 10*3/uL (ref 0.7–4.0)
MCH: 29.8 pg (ref 26.0–34.0)
MCHC: 33.2 g/dL (ref 30.0–36.0)
MCV: 89.7 fL (ref 78.0–100.0)
Monocytes Absolute: 1.2 10*3/uL (ref 0.1–1.0)
Monocytes Relative: 10 %
NEUTROS ABS: 4.8 10*3/uL (ref 1.7–7.7)
NEUTROS PCT: 37 %
Platelets: 281 10*3/uL (ref 150–400)
RBC: 4.06 MIL/uL — AB (ref 4.22–5.81)
RDW: 15.4 % (ref 11.5–15.5)
WBC: 13.1 10*3/uL — AB (ref 4.0–10.5)

## 2017-12-30 LAB — BASIC METABOLIC PANEL
Anion gap: 8 (ref 5–15)
BUN: 13 mg/dL (ref 8–23)
CO2: 29 mmol/L (ref 22–32)
Calcium: 8.9 mg/dL (ref 8.9–10.3)
Chloride: 101 mmol/L (ref 98–111)
Creatinine, Ser: 0.71 mg/dL (ref 0.61–1.24)
Glucose, Bld: 111 mg/dL — ABNORMAL HIGH (ref 70–99)
POTASSIUM: 3.7 mmol/L (ref 3.5–5.1)
SODIUM: 138 mmol/L (ref 135–145)

## 2017-12-30 LAB — VANCOMYCIN, TROUGH: VANCOMYCIN TR: 16 ug/mL (ref 15–20)

## 2017-12-30 NOTE — Progress Notes (Signed)
This is an acute visit.  Level care skilled.  Facility is [facility].  Chief complaint- acute visit follow-up blood sugars with history of type 2 diabetes as well as patient requests his ears to be looked at with a history of right mastoid osteomyelitis.  Patient is here for IV antibiotics and therapy for concerning right mastoid osteomyelitis.  Also has a history of diabetes as well as diastolic CHF hypertension atrial fibrillation on chronic Coumadin as well as hyperlipidemia CLL and previous history of osteomyelitis of the right heel.  He has a history of a chronic right ear infection with Bell's palsy which later developed an abscess that required a right canal wall down tympanomastoidectomy.  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  He is not complaining of any ear pain.  His A1c was elevated at 9.5 and adjustments were made including increasing his Levemir sliding scale with bolusing NovoLog insulin- as well as Amaryl Actos and Victoza.  However he developed at times some lower blood sugars in facility and his bolus NovoLog was recently reduced.--His Amaryl also has been decreased as well as his Levemir dose  However blood sugar still at times later  continue to be somewhat low in the 60s-it was 66 this afternoon.   Currently he is asymptomatic is visiting with family and is about to eat his dinner   Past Medical History:  Diagnosis Date  . Atrial fibrillation ([place])   . Chronic lymphocytic leukemia ([place])   . Coronary atherosclerosis of native coronary artery    Nonobstructive  . Essential hypertension, benign   . Hyperlipidemia   . IgA nephropathy    Dr. Lowanda Foster  . Type 2 diabetes mellitus ([place])   . UTI (urinary tract infection) july  10 th   taking  med         Past Surgical History:  Procedure Laterality Date  .  AMPUTATION Left 09/15/2012   Procedure: PARTIAL AMPUTATION 3rd TOE LEFT FOOT;  Surgeon: Marcheta Grammes, DPM;  Location: [facility];  Service: Orthopedics;  Laterality: Left;  . AMPUTATION Left 04/20/2013   Procedure: PARTIAL AMPUTATION 2ND TOE LEFT FOOT;  Surgeon: Marcheta Grammes, DPM;  Location: [facility];  Service: Orthopedics;  Laterality: Left;  . ANAL FISSURE REPAIR    . APPLICATION OF WOUND VAC Right 09/09/2016   Procedure: APPLICATION OF WOUND VAC;  Surgeon: Caprice Beaver, DPM;  Location: [facility];  Service: Podiatry;  Laterality: Right;  . CATARACT EXTRACTION W/PHACO Right 09/30/2015   Procedure: CATARACT EXTRACTION PHACO AND INTRAOCULAR LENS PLACEMENT (IOC);  Surgeon: Tonny Branch, MD;  Location: [facility];  Service: Ophthalmology;  Laterality: Right;  CDE: 11.78  . CATARACT EXTRACTION W/PHACO Left 10/31/2015   Procedure: CATARACT EXTRACTION PHACO AND INTRAOCULAR LENS PLACEMENT LEFT EYE; CDE:  9.10;  Surgeon: Tonny Branch, MD;  Location: [facility];  Service: Ophthalmology;  Laterality: Left;  . COLONOSCOPY  08/19/2011   Procedure: COLONOSCOPY;  Surgeon: Rogene Houston, MD;  [facility];  Service: Endoscopy;  Laterality: N/A;  930  . COLONOSCOPY N/A 09/19/2014   Procedure: COLONOSCOPY;  Surgeon: Rogene Houston, MD;  [facility]: [facility];  Service: Endoscopy;  Laterality: N/A;  1055  . FEMORAL HERNIA REPAIR    . INCISION AND DRAINAGE Right 09/09/2016   Procedure: DEBRIDEMENT WOUND RT heel with wound vac attachment;  Surgeon: Caprice Beaver,  Marland Kitchen, DPM;  Location: [facility];  Service: Podiatry;  Laterality: Right;  . INCISION AND DRAINAGE OF WOUND Right 08/12/2016   Procedure: DEBRIDEMENT WOUND RIGHT HEEL;  Surgeon: Caprice Beaver, DPM;  Location: [facility];  Service: Podiatry;  Laterality: Right;  right heel  . MASS EXCISION Right 09/28/2017   Procedure: EXCISION OF EXTERNAL AUDITORY CANAL MASS;  Surgeon: Leta Baptist, MD;  Location: [place];  Service: ENT;   Laterality: Right;  . Open repair left quadriceps tendon.  08/22/07   Dr. Aline Brochure  . Right total knee replacement    . TRANSURETHRAL RESECTION OF PROSTATE    . TYMPANOMASTOIDECTOMY Right 11/10/2017   Procedure: RIGHT TYMPANOMASTOIDECTOMY;  Surgeon: Leta Baptist, MD;  Location: [place];  Service: ENT;  Laterality: Right;  . WOUND EXPLORATION Right 08/12/2016   Procedure: EXPLORATION OF WOUND FOR FOREIGN BODY RIGHT HEEL;  Surgeon: Caprice Beaver, DPM;  Location: [facility];  Service: Podiatry;  Laterality: Right;  right heel         Allergies  Allergen Reactions  . Erythromycin Other (See Comments)  . Penicillins Hives and Other (See Comments)    Has patient had a PCN reaction causing immediate rash, facial/tongue/throat swelling, SOB or lightheadedness with hypotension: No Has patient had a PCN reaction causing severe rash involving mucus membranes or skin necrosis: No Has patient had a PCN reaction that required hospitalization No Has patient had a PCN reaction occurring within the last 10 years: No If all of the above answers are "NO", then may proceed with Cephalosporin use.  Has tolerated Rocephin         MEDICATIONS   Medication Sig  . Ascorbic Acid (VITAMIN C) 1000 MG tablet Take 1,000 mg by mouth daily with breakfast.   . atorvastatin (LIPITOR) 10 MG tablet Take 10 mg by mouth every evening.  . Calcium Carb-Cholecalciferol (CALCIUM 600+D) 600-800 MG-UNIT TABS Take 2 tablets by mouth daily with breakfast.  . cefTRIAXone 2 g in sodium chloride 0.9 % 100 mL Inject 2 g into the vein daily. Last dose on 01/23/18  . cholecalciferol (VITAMIN D) 1000 units tablet Take 1,000 Units by mouth every morning.  . ciclopirox (LOPROX) 0.77 % cream Apply 1 application topically every morning. Applied to feet in the morning  . Coenzyme Q10 (CO Q-10) 100 MG CAPS Take 100 mg by mouth daily with breakfast.   . digoxin (LANOXIN) 0.125 MG tablet Take 0.125 mg by mouth  daily with breakfast.   . finasteride (PROSCAR) 5 MG tablet Take 5 mg by mouth every morning.   . furosemide (LASIX) 20 MG tablet Take 20 mg by mouth daily.  Marland Kitchen glimepiride (AMARYL)1 MG tablet Take 1 mg by mouth daily with breakfast.   . guaiFENesin (MUCINEX) 600 MG 12 hr tablet Take 600 mg by mouth 2 (two) times daily.  . insulin aspart (NOVOLOG FLEXPEN) 100 UNIT/ML FlexPen Inject 8 Units into the skin 2 (two) times daily.  . insulin detemir (LEVEMIR) 100 UNIT/ML injection Inject 0.40mLs 672 Units total) into the skin every morning.  Marland Kitchen lisinopril (PRINIVIL,ZESTRIL) 20 MG tablet Take 40 mg by mouth daily with supper.  Marland Kitchen LORazepam (ATIVAN) 1 MG tablet Take 1 tablet (1 mg total) by mouth at bedtime.  . metroNIDAZOLE (FLAGYL) 500 MG tablet Take 1 tablet (500 mg total) by mouth every 8 (eight) hours. Last dose on 01/23/18  . Multiple Vitamin (MULTIVITAMIN WITH MINERALS) TABS tablet Take 1 tablet by mouth daily with breakfast.  . Omega-3  Fatty Acids (FISH OIL) 1200 MG CAPS Take 2 capsules by mouth 2 (two) times daily.  . pioglitazone (ACTOS) 15 MG tablet Take 15 mg by mouth every evening.   . traMADol (ULTRAM) 50 MG tablet Take 1 tablet (50 mg total) by mouth every 6 (six) hours as needed for moderate pain.  . vancomycin 1,250 mg in sodium chloride 0.9 % 250 mL Inject 1,250 mg into the vein every 12 (twelve) hours. Last dose on 01/23/18  . VICTOZA 18 MG/3ML SOPN Inject 1.8 mg into the skin daily with breakfast.  . vitamin E 400 UNIT capsule Take 400 Units by mouth every morning.  . [DISCONTINUED] insulin aspart (NOVOLOG FLEXPEN) 100 UNIT/ML FlexPen Inject 10 Units into the skin 3 (three) times daily with meals. (Patient taking differently: Inject 8 Units into the skin 2 (two) times daily. )  . [DISCONTINUED] insulin aspart (NOVOLOG FLEXPEN) 100 UNIT/ML FlexPen Give per sliding scale at bedtime.  . [DISCONTINUED] insulin aspart (NOVOLOG FLEXPEN) 100 UNIT/ML FlexPen Give per sliding scale with meals.  .  [DISCONTINUED] warfarin (COUMADIN) 7.5 MG tablet Take 5-7.5 mg by mouth See admin instructions. 5mg  on Tuesdays and Fridays. Takes 7.5mg  on all other days   No facility-administered encounter medications on file as of 12/28/2017.     General is not complaining any fever or chills.  Skin is not complain of rashes or itching.  Head ears eyes nose mouth and throat is not really complaining of ear pain does have Bell's palsy and currently right eye is covered-does not complain of visual changes left eye.  Respiratory is not complaining of cough today did complain of that earlier this week is not complain of shortness of breath.  Cardiac is not complaining of chest pain or increased edema.  GI is not complaining of abdominal pain nausea vomiting diarrhea constipation.  Musculoskeletal is not complaining of joint pain currently.  Neurologic does not complain of dizziness or headache.  Psych does not complain of being overtly depressed or anxious appears to be in good spirits   Physical exam.  Temp-98.6 pulse is 72 respirations 20 blood pressure 148/81 most recent weight 261.3.  General this is a pleasant well-developed elderly male in no distress sitting comfortably on side of his bed.  His skin is warm and dry.  Eyes he does have a patch over his right eye-stye acuity appears to be intact.  He does continue with a right-sided facial droop with the Bell's palsy.  .  Ears- left ear tympanic membrane is visualized it is pearly gray I do not see any exudate or erythema he has a minimal amount of wax in the ear canal.  Right ear continues with what appears to be some inflammation with erythematous changes tympanic membrane -he does have some mild exudate as well   Oropharynx is clear mucous membranes moist she does have a right sided facial droop as noted above.  Chest is clear to auscultation this evening there is no labored breathing.  Heart is largely regular rate and rhythm  with an occasional irregular beat he has fairly mild lower extremity edema bilaterally.  Abdomen is somewhat obese soft nontender with positive bowel sounds.  Musculoskeletal is able to move all extremities x4 it appears at baseline.  He does walk with a walker.  Neurologic as noted above does have the right sided facial droop with Bell's palsy.  Could not really appreciate other lateralizing findings.  Psych he has normal mood and affect he is pleasant and  appropriate.  Labs.  December 30, 2017.  INR today is 3.53 this is coming down from previous level.  WBC 13.1 hemoglobin 12.1 platelets 281.  Sodium 138 potassium 3.7 BUN 13 creatinine 0.71.  Assessment plan.  1.  History of acute mastoiditis with osteomyelitis-he continues on Rocephin as well as vancomycin and Flagyl through January 23, 2018- appears to be doing well right ear continues to be inflamed appearing but this would be expected for significant infection he has follow-up with ENT.  2.-Diabetes type 2 continues to have some lower blood sugars later in the day- will reduce his Levemir further down to 55 units-also will discontinue the Amaryl.  And will discontinue his bolus NovoLog at lunch and dinner and start a very loose sliding scale starting at CBG of 200 with 2 units and give 4 units for CBG greater than 251- if greater than 300 notify provider and give 4 units.  3.-  History of CHF with ejection fraction of 65% per echo in May 2019.  His Lasix has been temporarily increased for 5 days to 40 mg secondary to apparently recent chest x-ray final report showing some evidence of CHF- he will be on the Lasix for about 4 additional days- metabolic panel today shows stability but will have this rechecked early next week.  4.-History of elevated INR this appears to be trending down there are orders to restart Coumadin when INR reaches a level below 2- this point it is 3.53-update INR is pending in 2 days  #5 atrial  fibrillation this appears rate controlled continues on digoxin is on Coumadin for anticoagulation again this is being held temporarily secondary to elevated INR  [id number]

## 2018-01-01 ENCOUNTER — Encounter (HOSPITAL_COMMUNITY)
Admission: RE | Admit: 2018-01-01 | Discharge: 2018-01-01 | Disposition: A | Discharge status: Home / Self Care | Payer: Medicare Other | Source: Skilled Nursing Facility

## 2018-01-01 LAB — PROTIME-INR
INR: 1.63
Prothrombin Time: 19.2 s — ABNORMAL HIGH (ref 11.4–15.2)

## 2018-01-03 ENCOUNTER — Encounter (HOSPITAL_COMMUNITY)
Admission: RE | Admit: 2018-01-03 | Discharge: 2018-01-03 | Disposition: A | Discharge status: Home / Self Care | Payer: Medicare Other | Source: Skilled Nursing Facility | Attending: *Deleted | Admitting: *Deleted

## 2018-01-03 ENCOUNTER — Other Ambulatory Visit (HOSPITAL_COMMUNITY): Payer: Self-pay | Admitting: Specialist

## 2018-01-03 DIAGNOSIS — R1319 Other dysphagia: Secondary | ICD-10-CM

## 2018-01-03 LAB — BASIC METABOLIC PANEL
Anion gap: 9 (ref 5–15)
BUN: 14 mg/dL (ref 8–23)
CHLORIDE: 97 mmol/L — AB (ref 98–111)
CO2: 29 mmol/L (ref 22–32)
CREATININE: 0.72 mg/dL (ref 0.61–1.24)
Calcium: 8.4 mg/dL — ABNORMAL LOW (ref 8.9–10.3)
GFR calc Af Amer: >60 mL/min (ref >60)
GFR calc non Af Amer: >60 mL/min (ref >60)
GLUCOSE: 149 mg/dL — AB (ref 70–99)
Potassium: 3.1 mmol/L — ABNORMAL LOW (ref 3.5–5.1)
Sodium: 135 mmol/L (ref 135–145)

## 2018-01-03 LAB — PROTIME-INR
INR: 1.6
Prothrombin Time: 18.9 seconds — ABNORMAL HIGH (ref 11.4–15.2)

## 2018-01-03 LAB — VANCOMYCIN, RANDOM: Vancomycin Rm: 14

## 2018-01-04 ENCOUNTER — Encounter (HOSPITAL_COMMUNITY)
Admission: RE | Admit: 2018-01-04 | Discharge: 2018-01-04 | Disposition: A | Discharge status: Home / Self Care | Payer: Medicare Other | Source: Skilled Nursing Facility | Attending: Internal Medicine | Admitting: Internal Medicine

## 2018-01-04 DIAGNOSIS — M868X8 Other osteomyelitis, other site: Secondary | ICD-10-CM | POA: Insufficient documentation

## 2018-01-04 DIAGNOSIS — Z48810 Encounter for surgical aftercare following surgery on the sense organs: Secondary | ICD-10-CM | POA: Insufficient documentation

## 2018-01-04 DIAGNOSIS — I69822 Dysarthria following other cerebrovascular disease: Secondary | ICD-10-CM | POA: Insufficient documentation

## 2018-01-05 ENCOUNTER — Encounter (HOSPITAL_COMMUNITY)
Admission: RE | Admit: 2018-01-05 | Discharge: 2018-01-05 | Disposition: A | Discharge status: Home / Self Care | Payer: Medicare Other | Source: Skilled Nursing Facility | Attending: Internal Medicine | Admitting: Internal Medicine

## 2018-01-05 DIAGNOSIS — M868X8 Other osteomyelitis, other site: Secondary | ICD-10-CM | POA: Insufficient documentation

## 2018-01-05 DIAGNOSIS — Z48810 Encounter for surgical aftercare following surgery on the sense organs: Secondary | ICD-10-CM | POA: Insufficient documentation

## 2018-01-05 DIAGNOSIS — I69822 Dysarthria following other cerebrovascular disease: Secondary | ICD-10-CM | POA: Insufficient documentation

## 2018-01-05 DIAGNOSIS — E1165 Type 2 diabetes mellitus with hyperglycemia: Secondary | ICD-10-CM | POA: Insufficient documentation

## 2018-01-05 LAB — BASIC METABOLIC PANEL
Anion gap: 8 (ref 5–15)
BUN: 13 mg/dL (ref 8–23)
CALCIUM: 8.5 mg/dL — AB (ref 8.9–10.3)
CHLORIDE: 96 mmol/L — AB (ref 98–111)
CO2: 29 mmol/L (ref 22–32)
CREATININE: 0.67 mg/dL (ref 0.61–1.24)
GFR calc Af Amer: >60 mL/min (ref >60)
GFR calc non Af Amer: >60 mL/min (ref >60)
Glucose, Bld: 230 mg/dL — ABNORMAL HIGH (ref 70–99)
Potassium: 3.5 mmol/L (ref 3.5–5.1)
Sodium: 133 mmol/L — ABNORMAL LOW (ref 135–145)

## 2018-01-05 LAB — PROTIME-INR
INR: 1.76
PROTHROMBIN TIME: 20.3 s — AB (ref 11.4–15.2)

## 2018-01-06 ENCOUNTER — Encounter: Payer: Self-pay | Admitting: Internal Medicine

## 2018-01-06 ENCOUNTER — Non-Acute Institutional Stay (SKILLED_NURSING_FACILITY): Payer: Medicare Other | Admitting: Internal Medicine

## 2018-01-06 ENCOUNTER — Ambulatory Visit (HOSPITAL_COMMUNITY): Payer: Medicare Other | Attending: Internal Medicine | Admitting: Speech Pathology

## 2018-01-06 ENCOUNTER — Other Ambulatory Visit: Payer: Self-pay

## 2018-01-06 ENCOUNTER — Encounter (HOSPITAL_COMMUNITY): Payer: Self-pay | Admitting: Speech Pathology

## 2018-01-06 ENCOUNTER — Ambulatory Visit (HOSPITAL_COMMUNITY)
Admission: RE | Admit: 2018-01-06 | Discharge: 2018-01-06 | Disposition: A | Discharge status: Home / Self Care | Payer: Medicare Other | Source: Ambulatory Visit | Attending: Internal Medicine | Admitting: Internal Medicine

## 2018-01-06 DIAGNOSIS — Z7901 Long term (current) use of anticoagulants: Secondary | ICD-10-CM

## 2018-01-06 DIAGNOSIS — I5032 Chronic diastolic (congestive) heart failure: Secondary | ICD-10-CM

## 2018-01-06 DIAGNOSIS — R1319 Other dysphagia: Secondary | ICD-10-CM | POA: Diagnosis not present

## 2018-01-06 DIAGNOSIS — E1165 Type 2 diabetes mellitus with hyperglycemia: Secondary | ICD-10-CM

## 2018-01-06 DIAGNOSIS — R1310 Dysphagia, unspecified: Secondary | ICD-10-CM | POA: Diagnosis not present

## 2018-01-06 DIAGNOSIS — G510 Bell's palsy: Secondary | ICD-10-CM | POA: Diagnosis not present

## 2018-01-06 DIAGNOSIS — I482 Chronic atrial fibrillation, unspecified: Secondary | ICD-10-CM | POA: Diagnosis not present

## 2018-01-06 DIAGNOSIS — Z794 Long term (current) use of insulin: Secondary | ICD-10-CM | POA: Diagnosis not present

## 2018-01-06 DIAGNOSIS — R1312 Dysphagia, oropharyngeal phase: Secondary | ICD-10-CM | POA: Insufficient documentation

## 2018-01-06 NOTE — Progress Notes (Signed)
Location:    [facility] Room Number: 158/P Place of Service:  SNF [phone]) Provider: Veleta Miners MD  [name], MD  Patient Care Team: [name], MD as PCP - General (Internal Medicine)  Extended Emergency Contact Information Primary Emergency Contact: Schippers,Norma Address: [address]          [place], [place] [address] Johnnette Litter of [place] Phone: [phone] Mobile Phone: [phone] Relation: Spouse  Code Status:  Full Code Goals of care: Advanced Directive information Advanced Directives 01/06/2018  Does Patient Have a Medical Advance Directive? Yes  Type of Advance Directive (No Data)  Does patient want to make changes to medical advance directive? No - Patient declined  Copy of [place] in Chart? No - copy requested  Would patient like information on creating a medical advance directive? -  Pre-existing out of facility DNR order (yellow form or pink MOST form) -     Chief Complaint  Patient presents with  . Acute Visit    Patient is being seen for f/u BS/ Antibiotic/ Coumadin     HPI:  Pt is a 83 y.o. male seen today for an acute visit for BS management and Oral Thrush Patient isinSNF for therapy and IV antibiotics.for concerning Right Mastoid Osteomyelitis   Patient has h/o Diabetes Mellitus, Diastolic CHF,Hypertension, Chronic Atrial Fibrillation on Chronic Coumadin, HLD, CLL and osteomyelitis of right heel now healed.  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 He came to follow up with ENT with Severe Pain and per patient draining PUS from his ear. His BS during this time has also been fluctuating from 200- 500.  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 And is now discharged on 3 antibiotics for 6 weeks. Since his A1C was 9.5 he dose of Levimir was increased and was  started on Sliding scale and Bolus Novolog insulin.He is also on Amaryl, Actos and Victoza But in the Facility patient continued to have Hypoglycemia especially in afternoon. We eventually tapered him off his Amaryl. His Insulin was also decreased and Bolus was decreased. Assunta Curtis patient BS are running higher.then 250 now through out the day. He also had Thrush I his mouth as noticed by Speech therapy. Patient is also having some issues with Swallowing and is getting further evaluation with speech. He is tolerating 3 antibiotics as per ENT. He was also given Lasix increased dose . His weight came down and his cough was little Better. He again has some cough with Mucus.   Past Medical History:  Diagnosis Date  . Atrial fibrillation ([place])   . Chronic lymphocytic leukemia ([place])   . Coronary atherosclerosis of native coronary artery    Nonobstructive  . Essential hypertension, benign   . Hyperlipidemia   . IgA nephropathy    Dr. Lowanda Foster  . Type 2 diabetes mellitus ([place])   . UTI (urinary tract infection) july  10 th   taking  med    Past Surgical History:  Procedure Laterality Date  . AMPUTATION Left 09/15/2012   Procedure: PARTIAL AMPUTATION 3rd TOE LEFT FOOT;  Surgeon: Marcheta Grammes, DPM;  Location: [facility];  Service: Orthopedics;  Laterality: Left;  . AMPUTATION Left 04/20/2013   Procedure: PARTIAL AMPUTATION 2ND TOE LEFT FOOT;  Surgeon: Marcheta Grammes, DPM;  Location: [facility];  Service: Orthopedics;  Laterality: Left;  . ANAL FISSURE REPAIR    .  APPLICATION OF WOUND VAC Right 09/09/2016   Procedure: APPLICATION OF WOUND VAC;  Surgeon: Caprice Beaver, DPM;  Location: [facility];  Service: Podiatry;  Laterality: Right;  . CATARACT EXTRACTION W/PHACO Right 09/30/2015   Procedure: CATARACT EXTRACTION PHACO AND INTRAOCULAR LENS PLACEMENT (IOC);  Surgeon: Tonny Branch, MD;  Location: [facility];  Service: Ophthalmology;  Laterality: Right;  CDE: 11.78  . CATARACT EXTRACTION  W/PHACO Left 10/31/2015   Procedure: CATARACT EXTRACTION PHACO AND INTRAOCULAR LENS PLACEMENT LEFT EYE; CDE:  9.10;  Surgeon: Tonny Branch, MD;  Location: [facility];  Service: Ophthalmology;  Laterality: Left;  . COLONOSCOPY  08/19/2011   Procedure: COLONOSCOPY;  Surgeon: Rogene Houston, MD;  [facility];  Service: Endoscopy;  Laterality: N/A;  930  . COLONOSCOPY N/A 09/19/2014   Procedure: COLONOSCOPY;  Surgeon: Rogene Houston, MD;  [facility];  Service: Endoscopy;  Laterality: N/A;  1055  . FEMORAL HERNIA REPAIR    . INCISION AND DRAINAGE Right 09/09/2016   Procedure: DEBRIDEMENT WOUND RT heel with wound vac attachment;  Surgeon: Caprice Beaver, DPM;  Location: [facility];  Service: Podiatry;  Laterality: Right;  . INCISION AND DRAINAGE OF WOUND Right 08/12/2016   Procedure: DEBRIDEMENT WOUND RIGHT HEEL;  Surgeon: Caprice Beaver, DPM;  Location: [facility];  Service: Podiatry;  Laterality: Right;  right heel  . MASS EXCISION Right 09/28/2017   Procedure: EXCISION OF EXTERNAL AUDITORY CANAL MASS;  Surgeon: Leta Baptist, MD;  Location: [facility];  Service: ENT;  Laterality: Right;  . Open repair left quadriceps tendon.  08/22/07   Dr. Aline Brochure  . Right total knee replacement    . TRANSURETHRAL RESECTION OF PROSTATE    . TYMPANOMASTOIDECTOMY Right 11/10/2017   Procedure: RIGHT TYMPANOMASTOIDECTOMY;  Surgeon: Leta Baptist, MD;  Location: [facility];  Service: ENT;  Laterality: Right;  . WOUND EXPLORATION Right 08/12/2016   Procedure: EXPLORATION OF WOUND FOR FOREIGN BODY RIGHT HEEL;  Surgeon: Caprice Beaver, DPM;  Location: [facility];  Service: Podiatry;  Laterality: Right;  right heel    Allergies  Allergen Reactions  . Erythromycin Other (See Comments)  . Penicillins Hives and Other (See Comments)    Has patient had a PCN reaction causing immediate rash, facial/tongue/throat swelling, SOB or lightheadedness with hypotension: No Has patient had a PCN  reaction causing severe rash involving mucus membranes or skin necrosis: No Has patient had a PCN reaction that required hospitalization No Has patient had a PCN reaction occurring within the last 10 years: No If all of the above answers are "NO", then may proceed with Cephalosporin use.  Has tolerated Rocephin    Outpatient Encounter Medications as of 01/06/2018  Medication Sig  . Ascorbic Acid (VITAMIN C) 1000 MG tablet Take 1,000 mg by mouth daily with breakfast.   . atorvastatin (LIPITOR) 10 MG tablet Take 10 mg by mouth every evening.  . Calcium Carb-Cholecalciferol (CALCIUM 600+D) 600-800 MG-UNIT TABS Take 2 tablets by mouth daily with breakfast.  . cefTRIAXone 2 g in sodium chloride 0.9 % 100 mL Inject 2 g into the vein daily. Last dose on 01/23/18  . cholecalciferol (VITAMIN D) 1000 units tablet Take 1,000 Units by mouth every morning.  . ciclopirox (LOPROX) 0.77 % cream Apply 1 application topically every morning. Applied to feet in the morning  . Coenzyme Q10 (CO Q-10) 100 MG CAPS Take 100 mg by mouth daily with breakfast.   . digoxin (LANOXIN) 0.125 MG tablet Take 0.125 mg  by mouth daily with breakfast.   . finasteride (PROSCAR) 5 MG tablet Take 5 mg by mouth every morning.   . furosemide (LASIX) 20 MG tablet Take 20 mg by mouth daily.  . insulin aspart (NOVOLOG FLEXPEN) 100 UNIT/ML FlexPen Give per sliding  From 0-4 units twice a day  . Insulin Detemir (LEVEMIR FLEXTOUCH) 100 UNIT/ML Pen Inject 55 Units into the skin daily.  Marland Kitchen lisinopril (PRINIVIL,ZESTRIL) 20 MG tablet Take 40 mg by mouth daily with supper.  Marland Kitchen LORazepam (ATIVAN) 1 MG tablet Take 1 tablet (1 mg total) by mouth at bedtime.  . metroNIDAZOLE (FLAGYL) 500 MG tablet Take 1 tablet (500 mg total) by mouth every 8 (eight) hours. Last dose on 01/23/18  . Multiple Vitamin (MULTIVITAMIN WITH MINERALS) TABS tablet Take 1 tablet by mouth daily with breakfast.  . Omega-3 Fatty Acids (FISH OIL) 1200 MG CAPS Take 2 capsules by  mouth 2 (two) times daily.  . pioglitazone (ACTOS) 15 MG tablet Take 15 mg by mouth every evening.   . potassium chloride SA (K-DUR,KLOR-CON) 20 MEQ tablet Take 20 mEq by mouth 2 (two) times daily. Take from 01/04/2018-01/06/2018  . potassium chloride SA (K-DUR,KLOR-CON) 20 MEQ tablet Take 20 mEq by mouth daily. Take starting 01/07/2018  . traMADol (ULTRAM) 50 MG tablet Take 1 tablet (50 mg total) by mouth every 6 (six) hours as needed for moderate pain.  . vancomycin 1,500 mg in sodium chloride 0.9 % 500 mL Inject 1,500 mg into the vein 2 (two) times daily.  Marland Kitchen VICTOZA 18 MG/3ML SOPN Inject 1.8 mg into the skin daily with breakfast.  . vitamin E 400 UNIT capsule Take 400 Units by mouth every morning.  . warfarin (COUMADIN) 5 MG tablet Take 5 mg by mouth daily.  . [DISCONTINUED] glimepiride (AMARYL) 2 MG tablet Take 2 mg by mouth daily with breakfast.   . [DISCONTINUED] guaiFENesin (MUCINEX) 600 MG 12 hr tablet Take 600 mg by mouth 2 (two) times daily.  . [DISCONTINUED] insulin aspart (NOVOLOG FLEXPEN) 100 UNIT/ML FlexPen Inject 8 Units into the skin 2 (two) times daily.  . [DISCONTINUED] insulin detemir (LEVEMIR) 100 UNIT/ML injection Inject 0.72 mLs (72 Units total) into the skin every morning. (Patient taking differently: Inject 55 Units into the skin every morning. )  . [DISCONTINUED] vancomycin 1,250 mg in sodium chloride 0.9 % 250 mL Inject 1,250 mg into the vein every 12 (twelve) hours. Last dose on 01/23/18   No facility-administered encounter medications on file as of 01/06/2018.      Review of Systems  Constitutional: Negative.   HENT: Positive for congestion.   Respiratory: Positive for cough. Negative for shortness of breath.   Cardiovascular: Negative.   Gastrointestinal: Negative.   Genitourinary: Negative.   Musculoskeletal: Negative.   Skin: Negative.   Neurological: Negative.   Psychiatric/Behavioral: Negative.      There is no immunization history on file for this  patient. Pertinent  Health Maintenance Due  Topic Date Due  . INFLUENZA VACCINE  01/19/2018 (Originally 11/04/2017)  . FOOT EXAM  01/19/2018 (Originally 10/14/1944)  . OPHTHALMOLOGY EXAM  01/19/2018 (Originally 10/14/1944)  . PNA vac Low Risk Adult (1 of 2 - PCV13) 01/19/2018 (Originally 10[birth year]13/2001)  . URINE MICROALBUMIN  01/26/2018 (Originally 10/14/1944)  . HEMOGLOBIN A1C  06/14/2018   No flowsheet data found. Functional Status Survey:    Vitals:   01/06/18 1054  BP: 139/78  Pulse: 82  Resp: 20  Temp: 98.2 F (36.8 C)  TempSrc: Oral  There is no height or weight on file to calculate BMI. Physical Exam  Constitutional: He is oriented to person, place, and time. He appears well-developed and well-nourished.  HENT:  Head: Normocephalic.  Oral Thrush Present  Eyes: Pupils are equal, round, and reactive to light.  Neck: Neck supple.  Cardiovascular: Normal rate. An irregular rhythm present.  Pulmonary/Chest: Effort normal and breath sounds normal. No stridor. No respiratory distress. He has no wheezes.  Abdominal: Soft. Bowel sounds are normal. He exhibits no distension. There is no tenderness. There is no guarding.  Musculoskeletal:  Mild edema Bilateral with Chronic Venous changes  Neurological: He is alert and oriented to person, place, and time.  Walking with Gilford Rile . Right sided facial Droop due to Bells Palsy.  Skin: Skin is warm and dry.  Psychiatric: He has a normal mood and affect. His behavior is normal. Thought content normal.    Labs reviewed: Recent Labs    12/30/17 0700 01/03/18 2300 01/05/18 0727  NA 138 135 133*  K 3.7 3.1* 3.5  CL 101 97* 96*  CO2 29 29 29   GLUCOSE 111* 149* 230*  BUN 13 14 13   CREATININE 0.71 0.72 0.67  CALCIUM 8.9 8.4* 8.5*   Recent Labs    06/29/17 1400 11/04/17 2028 12/14/17 0527  AST 32 18 19  ALT 47 20 17  ALKPHOS 97 86 98  BILITOT 1.2 1.3* 0.8  PROT 6.1* 6.1* 6.2*  ALBUMIN 3.3* 3.5 3.1*   Recent Labs     11/04/17 2028  12/13/17 1431  12/18/17 0513 12/20/17 0720 12/30/17 0700  WBC 12.1*   < > 19.5*   < > 14.7* 16.8* 13.1*  NEUTROABS 3.6  --  7.4  --   --   --  4.8  HGB 14.8   < > 13.6   < > 12.2* 12.4* 12.1*  HCT 43.3   < > 40.9   < > 38.0* 36.4* 36.4*  MCV 86.4   < > 87.0   < > 88.0 88.8 89.7  PLT 165   < > 301   < > 244 260 281   < > = values in this interval not displayed.   No results found for: TSH Lab Results  Component Value Date   HGBA1C 9.5 (H) 12/14/2017   Lab Results  Component Value Date   CHOL 124 10/06/2016   HDL 23 (L) 10/06/2016   LDLCALC 71 10/06/2016   TRIG 150 (H) 10/06/2016   CHOLHDL 5.4 10/06/2016    Significant Diagnostic Results in last 30 days:  Dg Chest Port 1 View  Result Date: 12/16/2017 CLINICAL DATA:  PICC line placement. EXAM: PORTABLE CHEST 1 VIEW COMPARISON:  06/29/2016 FINDINGS: Right PICC line terminates at the expected location of proximal superior vena cava. Enlarged cardiac silhouette. Calcific atherosclerotic disease of the aorta. There is no evidence of pneumothorax. Coarsening of the interstitium. Osseous structures are without acute abnormality. Soft tissues are grossly normal. IMPRESSION: Right PICC line terminates at the expected location of proximal superior vena cava. No evidence of pneumothorax. Electronically Signed   By: Fidela Salisbury M.D.   On: 12/16/2017 12:55    Assessment/Plan  Diabetes mellitus Will increase  his Levemir to 6o units from 55 Continue Actos and Victoza Loose sliding scale for now. Continue Accu-Cheks Amaryl discontinued History of CHF with LVEF of 65% in 05/19 Patient little reluctant to increase his Lasix as he says he has to go to the bathroom very frequently. We will increase  his Lasix to 40 mg for 3 days / Week and 20 mg rest of the days Continue potassium Repeat BMP Continue to follow weight Acute mastoiditis Patient on 3 antibiotics  Ceftriaxone, Vanco and Flogyl till 10/20 Clinically  doing well. With No feverWhite Count coming down Follow up with ENT Repeat CBC  Facial nerve palsy Continue to use Patch and Keeping it mosit  Atrial fibrillation, chronic On Chronic Coumadin Dose was changed due to elevated INR Will get follow-up INR Essential hypertension Controlled on lisinopril and Lasix Orla Thrush Start on Nystatin Dysphagia Getting Speech Eval  Family/ staff Communication:   Labs/tests ordered:

## 2018-01-06 NOTE — Therapy (Signed)
[place], [place], [address] Phone: [phone]   Fax:  [phone]  Modified Barium Swallow  Patient Details  Name: [name] [name] MRN: [id number] Date of Birth: [birth year] No data recorded  Encounter Date: 01/06/2018    Past Medical History:  Diagnosis Date  . Atrial fibrillation ([place])   . Chronic lymphocytic leukemia ([place])   . Coronary atherosclerosis of native coronary artery    Nonobstructive  . Essential hypertension, benign   . Hyperlipidemia   . IgA nephropathy    Dr. Lowanda Foster  . Type 2 diabetes mellitus ([place])   . UTI (urinary tract infection) july  10 th   taking  med     Past Surgical History:  Procedure Laterality Date  . AMPUTATION Left 09/15/2012   Procedure: PARTIAL AMPUTATION 3rd TOE LEFT FOOT;  Surgeon: Marcheta Grammes, DPM;  Location: [facility];  Service: Orthopedics;  Laterality: Left;  . AMPUTATION Left 04/20/2013   Procedure: PARTIAL AMPUTATION 2ND TOE LEFT FOOT;  Surgeon: Marcheta Grammes, DPM;  Location: [facility];  Service: Orthopedics;  Laterality: Left;  . ANAL FISSURE REPAIR    . APPLICATION OF WOUND VAC Right 09/09/2016   Procedure: APPLICATION OF WOUND VAC;  Surgeon: Caprice Beaver, DPM;  Location: [facility];  Service: Podiatry;  Laterality: Right;  . CATARACT EXTRACTION W/PHACO Right 09/30/2015   Procedure: CATARACT EXTRACTION PHACO AND INTRAOCULAR LENS PLACEMENT (IOC);  Surgeon: Tonny Branch, MD;  Location: [facility];  Service: Ophthalmology;  Laterality: Right;  CDE: 11.78  . CATARACT EXTRACTION W/PHACO Left 10/31/2015   Procedure: CATARACT EXTRACTION PHACO AND INTRAOCULAR LENS PLACEMENT LEFT EYE; CDE:  9.10;  Surgeon: Tonny Branch, MD;  Location: [facility];  Service: Ophthalmology;  Laterality: Left;  . COLONOSCOPY  08/19/2011   Procedure: COLONOSCOPY;  Surgeon: Rogene Houston, MD;  [facility];  Service: Endoscopy;  Laterality: N/A;  930  . COLONOSCOPY N/A 09/19/2014   Procedure: COLONOSCOPY;  Surgeon: Rogene Houston, MD;  [facility];  Service: Endoscopy;  Laterality: N/A;  1055  . FEMORAL HERNIA REPAIR    . INCISION AND DRAINAGE Right 09/09/2016   Procedure: DEBRIDEMENT WOUND RT heel with wound vac attachment;  Surgeon: Caprice Beaver, DPM;  Location: [facility];  Service: Podiatry;  Laterality: Right;  . INCISION AND DRAINAGE OF WOUND Right 08/12/2016   Procedure: DEBRIDEMENT WOUND RIGHT HEEL;  Surgeon: Caprice Beaver, DPM;  Location: [facility];  Service: Podiatry;  Laterality: Right;  right heel  . MASS EXCISION Right 09/28/2017   Procedure: EXCISION OF EXTERNAL AUDITORY CANAL MASS;  Surgeon: Leta Baptist, MD;  Location: [facility];  Service: ENT;  Laterality: Right;  . Open repair left quadriceps tendon.  08/22/07   Dr. Aline Brochure  . Right total knee replacement    . TRANSURETHRAL RESECTION OF PROSTATE    . TYMPANOMASTOIDECTOMY Right 11/10/2017   Procedure: RIGHT TYMPANOMASTOIDECTOMY;  Surgeon: Leta Baptist, MD;  Location: [facility];  Service: ENT;  Laterality: Right;  . WOUND EXPLORATION Right 08/12/2016   Procedure: EXPLORATION OF WOUND FOR FOREIGN BODY RIGHT HEEL;  Surgeon: Caprice Beaver, DPM;  Location: [facility];  Service: Podiatry;  Laterality: Right;  right heel    There were no vitals filed for this visit.  Subjective Assessment - 01/06/18 1539    Subjective  "He has been coughing a lot with meals."    Special Tests  MBSS    Currently in Pain?  No/denies          General - 01/06/18 1540      General Information   Date of Onset  11/10/17    HPI  83 y.o. male with hx of DM2, UTI, IgA neph, HTN, afib , CLL and CAD direct admit from Dr Deeann Saint ENT office for mastoiditis/ OM w/ facial nerve palsy failing outpt abx. Subsequently had Tympanomastoidectomy on 11/10/17 after hospitalization for IV abx (8/1-8/6).  3 weeks after surgery, pt noted increasing pain and drainage from right ear.  Had office follow up  12/13/17-->sent to hospital for IV abx. He was then discharged to [facility] for continued antibiotics. Pt is referred by Dr. Veleta Miners for MBSS due to increased coughing with meals. He was accompanied to the MBSS by his treating SLP.     Type of Study  MBS-Modified Barium Swallow Study    Previous Swallow Assessment  None on record    Diet Prior to this Study  Regular;Thin liquids   Pt refused ground   Temperature Spikes Noted  No    Respiratory Status  Room air    History of Recent Intubation  No    Behavior/Cognition  Alert;Cooperative;Pleasant mood    Oral Cavity Assessment  Excessive secretions    Oral Care Completed by SLP  No    Oral Cavity - Dentition  Adequate natural dentition    Vision  Functional for self feeding    Self-Feeding Abilities  Able to feed self    Baseline Vocal Quality  Normal    Volitional Cough  Strong    Volitional Swallow  Able to elicit    Anatomy  Within functional limits    Pharyngeal Secretions  Not observed secondary MBS         Oral Preparation/Oral Phase - 01/06/18 1541      Oral Preparation/Oral Phase   Oral Phase  Impaired      Oral - Thin   Oral - Thin Cup  Decreased bolus cohesion;Oral residue      Oral - Solids   Oral - Puree  Decreased bolus cohesion;Oral residue;Piecemeal swallowing    Oral - Regular  Decreased bolus cohesion;Oral residue;Piecemeal swallowing;Delayed A-P transit      Electrical stimulation - Oral Phase   Was Electrical Stimulation Used  No       Pharyngeal Phase - 01/06/18 1542      Pharyngeal Phase   Pharyngeal Phase  Impaired      Pharyngeal - Nectar   Pharyngeal- Nectar Cup  Swallow initiation at pyriform sinus;Delayed swallow initiation;Reduced epiglottic inversion;Reduced tongue base retraction;Penetration/Aspiration during swallow;Pharyngeal residue - valleculae;Pharyngeal residue - pyriform;Pharyngeal residue - cp segment    Pharyngeal  Material enters airway, remains ABOVE vocal cords and not ejected  out;Material does not enter airway      Pharyngeal - Thin   Pharyngeal- Thin Teaspoon  Swallow initiation at vallecula    Pharyngeal- Thin Cup  Delayed swallow initiation;Swallow initiation at vallecula;Penetration/Aspiration during swallow;Reduced tongue base retraction;Reduced epiglottic inversion;Pharyngeal residue - valleculae;Pharyngeal residue - pyriform;Pharyngeal residue - cp segment;Compensatory strategies attempted (with notebox)   chin tuck ineffective   Pharyngeal  Material does not enter airway;Material enters airway, remains ABOVE vocal cords and not ejected out;Material enters airway, CONTACTS cords and then ejected out      Pharyngeal - Solids   Pharyngeal- Puree  Delayed swallow initiation;Reduced epiglottic inversion;Reduced tongue base retraction;Reduced pharyngeal peristalsis;Pharyngeal residue - pyriform;Pharyngeal residue - valleculae;Pharyngeal residue - posterior pharnyx;Compensatory strategies attempted (with notebox)  effortful swallow and head turns ineffective   Pharyngeal- Regular  Delayed swallow initiation-vallecula;Reduced epiglottic inversion;Reduced anterior laryngeal mobility;Reduced tongue base retraction;Pharyngeal residue - valleculae;Pharyngeal residue - pyriform;Pharyngeal residue - posterior pharnyx    Pharyngeal- Pill  Within functional limits      Electrical Stimulation - Pharyngeal Phase   Was Electrical Stimulation Used  No       Cricopharyngeal Phase - 01/06/18 1555      Cervical Esophageal Phase   Cervical Esophageal Phase  Within functional limits         Plan - 01/06/18 1557    Clinical Impression Statement  Pt presents with moderate oropharyngeal phase dysphagia in setting of acute Bell's Palsy characterized by decreased bolus manipulation and cohesion, oral scatter/residue and piecemeal deglutition, delay in swallow initiation with swallow trigger after filling the valleculae, reduced tongue base retraction, epiglottic deflection, and  posterior pharyngeal wall constriction resulting in piecemeal deglutition, penetration during the swallow with trace amount to the cords without aspiration and residuals in the valleculae, posterior pharyngeal wall, and pyriforms post swallow. Although gross aspiration was not observed, Pt is at risk for aspiration due to severity of pharyngeal residuals across textures and consistencies. There was no perceivable benefit to NTL over thins, however regular texture residuals were greater in the valleculae than with puree textures. Recommend D3/mech soft with thin liquids via small cup sips, no straws, repeat swallows for each bite/sip, and clear throat periodically. Pt will need to be monitored with meals due to aspiration risk.       Patient will benefit from skilled therapeutic intervention in order to improve the following deficits and impairments:   Dysphagia, oropharyngeal phase    Recommendations/Treatment - 01/06/18 1555      Swallow Evaluation Recommendations   SLP Diet Recommendations  Dysphagia 3 (mechanical soft);Thin    Liquid Administration via  Cup;No straw    Medication Administration  Whole meds with liquid    Supervision  Patient able to self feed;Full supervision/cueing for compensatory strategies    Compensations  Slow rate;Small sips/bites;Multiple dry swallows after each bite/sip    Postural Changes  Seated upright at 90 degrees;Remain upright for at least 30 minutes after feeds/meals       Prognosis - 01/06/18 1557      Prognosis   Prognosis for Safe Diet Advancement  Guarded    Barriers to Reach Goals  Severity of deficits      Individuals Consulted   Consulted and Agree with Results and Recommendations  Patient    Report Sent to   Referring physician;Primary SLP       Problem List Patient Active Problem List   Diagnosis Date Noted  . Status post peripherally inserted central catheter (PICC) central line placement   . Hemorrhagic otitis externa of right ear  12/13/2017  . Hemorrhagic otitis externa 12/13/2017  . Uncontrolled type 2 diabetes mellitus with hyperglycemia, with long-term current use of insulin ([place]) 11/06/2017  . Acute mastoiditis 11/05/2017  . Atrial fibrillation, chronic 11/05/2017  . Chronic diastolic CHF (congestive heart failure) ([place]) 11/05/2017  . Mastoiditis 11/04/2017  . Disorder of right facial nerve 11/04/2017  . Facial nerve palsy 11/04/2017  . Essential hypertension 09/10/2016  . Rt Heel Osteomyelitis ([place]) 09/08/2016  . Cellulitis of heel, right 09/08/2016  . Hypoglycemia 06/21/2016  . Type 2 diabetes mellitus ([place])   . Chronic lymphocytic leukemia ([place])   . Encounter for therapeutic drug monitoring 05/16/2013  . Cardiac murmur 10/21/2011  . Long term current use  of anticoagulant therapy 06/27/2010  . Mixed hyperlipidemia 05/21/2008  . CORONARY ATHEROSCLEROSIS NATIVE CORONARY ARTERY 05/21/2008  . Atrial fibrillation ([place]) 05/21/2008  . KNEE PAIN 06/14/2007   Thank you,  Genene Churn, [place]   [place] 01/06/2018, 4:09 PM  [place] [address] [place], [place], [address] Phone: [phone]   Fax:  [phone]  Name: [name] [name] MRN: [id number] Date of Birth: 1934/04/30

## 2018-01-07 ENCOUNTER — Encounter (HOSPITAL_COMMUNITY)
Admission: RE | Admit: 2018-01-07 | Discharge: 2018-01-07 | Disposition: A | Discharge status: Home / Self Care | Payer: Medicare Other | Source: Skilled Nursing Facility | Attending: Internal Medicine | Admitting: Internal Medicine

## 2018-01-07 DIAGNOSIS — G510 Bell's palsy: Secondary | ICD-10-CM | POA: Insufficient documentation

## 2018-01-07 DIAGNOSIS — M868X8 Other osteomyelitis, other site: Secondary | ICD-10-CM | POA: Insufficient documentation

## 2018-01-07 DIAGNOSIS — I69822 Dysarthria following other cerebrovascular disease: Secondary | ICD-10-CM | POA: Insufficient documentation

## 2018-01-07 DIAGNOSIS — Z48810 Encounter for surgical aftercare following surgery on the sense organs: Secondary | ICD-10-CM | POA: Insufficient documentation

## 2018-01-07 DIAGNOSIS — E1165 Type 2 diabetes mellitus with hyperglycemia: Secondary | ICD-10-CM | POA: Insufficient documentation

## 2018-01-07 LAB — GENTAMICIN LEVEL, TROUGH: Gentamicin Trough: <0.5 ug/mL — ABNORMAL LOW (ref 0.5–2.0)

## 2018-01-07 LAB — VANCOMYCIN, TROUGH: Vancomycin Tr: 15 ug/mL (ref 15–20)

## 2018-01-07 LAB — BASIC METABOLIC PANEL
ANION GAP: 9 (ref 5–15)
BUN: 11 mg/dL (ref 8–23)
CO2: 30 mmol/L (ref 22–32)
Calcium: 9 mg/dL (ref 8.9–10.3)
Chloride: 97 mmol/L — ABNORMAL LOW (ref 98–111)
Creatinine, Ser: 0.67 mg/dL (ref 0.61–1.24)
GFR calc Af Amer: >60 mL/min (ref >60)
GLUCOSE: 170 mg/dL — AB (ref 70–99)
Potassium: 4 mmol/L (ref 3.5–5.1)
Sodium: 136 mmol/L (ref 135–145)

## 2018-01-07 LAB — PROTIME-INR
INR: 2.01
PROTHROMBIN TIME: 22.6 s — AB (ref 11.4–15.2)

## 2018-01-10 ENCOUNTER — Ambulatory Visit (INDEPENDENT_AMBULATORY_CARE_PROVIDER_SITE_OTHER): Payer: Medicare Other | Admitting: Otolaryngology

## 2018-01-10 ENCOUNTER — Encounter (HOSPITAL_COMMUNITY)
Admission: RE | Admit: 2018-01-10 | Discharge: 2018-01-10 | Disposition: A | Discharge status: Home / Self Care | Payer: Medicare Other | Source: Skilled Nursing Facility | Attending: Internal Medicine | Admitting: Internal Medicine

## 2018-01-10 DIAGNOSIS — H7011 Chronic mastoiditis, right ear: Secondary | ICD-10-CM

## 2018-01-10 LAB — PROTIME-INR
INR: 2.22
PROTHROMBIN TIME: 24.4 s — AB (ref 11.4–15.2)

## 2018-01-10 LAB — VANCOMYCIN, TROUGH: Vancomycin Tr: 21 ug/mL (ref 15–20)

## 2018-01-11 ENCOUNTER — Encounter (HOSPITAL_COMMUNITY)
Admission: RE | Admit: 2018-01-11 | Discharge: 2018-01-11 | Disposition: A | Discharge status: Home / Self Care | Payer: Medicare Other | Source: Skilled Nursing Facility | Attending: Internal Medicine | Admitting: Internal Medicine

## 2018-01-11 DIAGNOSIS — M868X8 Other osteomyelitis, other site: Secondary | ICD-10-CM | POA: Insufficient documentation

## 2018-01-11 DIAGNOSIS — Z48810 Encounter for surgical aftercare following surgery on the sense organs: Secondary | ICD-10-CM | POA: Insufficient documentation

## 2018-01-11 DIAGNOSIS — I69822 Dysarthria following other cerebrovascular disease: Secondary | ICD-10-CM | POA: Insufficient documentation

## 2018-01-11 LAB — BASIC METABOLIC PANEL
ANION GAP: 8 (ref 5–15)
BUN: 12 mg/dL (ref 8–23)
CHLORIDE: 96 mmol/L — AB (ref 98–111)
CO2: 31 mmol/L (ref 22–32)
CREATININE: 0.71 mg/dL (ref 0.61–1.24)
Calcium: 9 mg/dL (ref 8.9–10.3)
GFR calc non Af Amer: >60 mL/min (ref >60)
Glucose, Bld: 129 mg/dL — ABNORMAL HIGH (ref 70–99)
POTASSIUM: 4.2 mmol/L (ref 3.5–5.1)
SODIUM: 135 mmol/L (ref 135–145)

## 2018-01-11 LAB — CBC WITH DIFFERENTIAL/PLATELET
BASOS ABS: 0 10*3/uL (ref 0.0–0.1)
Basophils Relative: 0 %
EOS PCT: 2 %
Eosinophils Absolute: 0.3 10*3/uL (ref 0.0–0.5)
HEMATOCRIT: 40.2 % (ref 39.0–52.0)
HEMOGLOBIN: 13.2 g/dL (ref 13.0–17.0)
LYMPHS ABS: 8.1 10*3/uL (ref 0.7–4.0)
LYMPHS PCT: 55 %
MCH: 29.3 pg (ref 26.0–34.0)
MCHC: 32.8 g/dL (ref 30.0–36.0)
MCV: 89.3 fL (ref 80.0–100.0)
Monocytes Absolute: 1.4 10*3/uL (ref 0.1–1.0)
Monocytes Relative: 10 %
NEUTROS ABS: 4.8 10*3/uL (ref 1.7–7.7)
Neutrophils Relative %: 33 %
Platelets: 304 10*3/uL (ref 150–400)
RBC: 4.5 MIL/uL (ref 4.22–5.81)
RDW: 15.5 % (ref 11.5–15.5)
WBC: 14.6 10*3/uL — AB (ref 4.0–10.5)

## 2018-01-11 LAB — PROTIME-INR
INR: 2.42
PROTHROMBIN TIME: 26.1 s — AB (ref 11.4–15.2)

## 2018-01-12 ENCOUNTER — Encounter (HOSPITAL_COMMUNITY)
Admission: RE | Admit: 2018-01-12 | Discharge: 2018-01-12 | Disposition: A | Discharge status: Home / Self Care | Payer: Medicare Other | Source: Skilled Nursing Facility | Attending: Internal Medicine | Admitting: Internal Medicine

## 2018-01-12 DIAGNOSIS — Z48810 Encounter for surgical aftercare following surgery on the sense organs: Secondary | ICD-10-CM | POA: Insufficient documentation

## 2018-01-12 DIAGNOSIS — M868X8 Other osteomyelitis, other site: Secondary | ICD-10-CM | POA: Insufficient documentation

## 2018-01-12 DIAGNOSIS — I69822 Dysarthria following other cerebrovascular disease: Secondary | ICD-10-CM | POA: Insufficient documentation

## 2018-01-13 ENCOUNTER — Encounter (HOSPITAL_COMMUNITY)
Admission: RE | Admit: 2018-01-13 | Discharge: 2018-01-13 | Disposition: A | Discharge status: Home / Self Care | Payer: Medicare Other | Source: Skilled Nursing Facility | Attending: Internal Medicine | Admitting: Internal Medicine

## 2018-01-13 DIAGNOSIS — M868X8 Other osteomyelitis, other site: Secondary | ICD-10-CM | POA: Insufficient documentation

## 2018-01-13 DIAGNOSIS — Z48810 Encounter for surgical aftercare following surgery on the sense organs: Secondary | ICD-10-CM | POA: Insufficient documentation

## 2018-01-13 LAB — PROTIME-INR
INR: 2.93
Prothrombin Time: 30.4 seconds — ABNORMAL HIGH (ref 11.4–15.2)

## 2018-01-13 LAB — VANCOMYCIN, TROUGH: Vancomycin Tr: 17 ug/mL (ref 15–20)

## 2018-01-17 ENCOUNTER — Encounter (HOSPITAL_COMMUNITY)
Admission: RE | Admit: 2018-01-17 | Discharge: 2018-01-17 | Disposition: A | Discharge status: Home / Self Care | Payer: Medicare Other | Source: Skilled Nursing Facility | Attending: *Deleted | Admitting: *Deleted

## 2018-01-17 ENCOUNTER — Ambulatory Visit (INDEPENDENT_AMBULATORY_CARE_PROVIDER_SITE_OTHER): Payer: Medicare Other | Admitting: Otolaryngology

## 2018-01-17 LAB — BASIC METABOLIC PANEL
Anion gap: 10 (ref 5–15)
BUN: 13 mg/dL (ref 8–23)
CO2: 28 mmol/L (ref 22–32)
Calcium: 9.2 mg/dL (ref 8.9–10.3)
Chloride: 96 mmol/L — ABNORMAL LOW (ref 98–111)
Creatinine, Ser: 0.69 mg/dL (ref 0.61–1.24)
GFR calc non Af Amer: >60 mL/min (ref >60)
Glucose, Bld: 160 mg/dL — ABNORMAL HIGH (ref 70–99)
Potassium: 3.7 mmol/L (ref 3.5–5.1)
SODIUM: 134 mmol/L — AB (ref 135–145)

## 2018-01-17 LAB — CBC
HCT: 40.8 % (ref 39.0–52.0)
Hemoglobin: 13.1 g/dL (ref 13.0–17.0)
MCH: 28.7 pg (ref 26.0–34.0)
MCHC: 32.1 g/dL (ref 30.0–36.0)
MCV: 89.3 fL (ref 80.0–100.0)
NRBC: 0 % (ref 0.0–0.2)
PLATELETS: 347 10*3/uL (ref 150–400)
RBC: 4.57 MIL/uL (ref 4.22–5.81)
RDW: 15.1 % (ref 11.5–15.5)
WBC: 16.2 10*3/uL — AB (ref 4.0–10.5)

## 2018-01-17 LAB — PROTIME-INR
INR: 2.84
PROTHROMBIN TIME: 29.6 s — AB (ref 11.4–15.2)

## 2018-01-17 LAB — VANCOMYCIN, TROUGH: VANCOMYCIN TR: 19 ug/mL (ref 15–20)

## 2018-01-18 ENCOUNTER — Non-Acute Institutional Stay (SKILLED_NURSING_FACILITY): Payer: Medicare Other | Admitting: Internal Medicine

## 2018-01-18 ENCOUNTER — Encounter: Payer: Self-pay | Admitting: Internal Medicine

## 2018-01-18 ENCOUNTER — Other Ambulatory Visit: Payer: Self-pay

## 2018-01-18 DIAGNOSIS — I5032 Chronic diastolic (congestive) heart failure: Secondary | ICD-10-CM | POA: Diagnosis not present

## 2018-01-18 DIAGNOSIS — Z794 Long term (current) use of insulin: Secondary | ICD-10-CM

## 2018-01-18 DIAGNOSIS — H70001 Acute mastoiditis without complications, right ear: Secondary | ICD-10-CM | POA: Diagnosis not present

## 2018-01-18 DIAGNOSIS — I10 Essential (primary) hypertension: Secondary | ICD-10-CM

## 2018-01-18 DIAGNOSIS — I482 Chronic atrial fibrillation, unspecified: Secondary | ICD-10-CM | POA: Diagnosis not present

## 2018-01-18 DIAGNOSIS — E1165 Type 2 diabetes mellitus with hyperglycemia: Secondary | ICD-10-CM

## 2018-01-18 MED ORDER — LORAZEPAM 1 MG PO TABS: 1.0000 mg | ORAL_TABLET | Freq: Every day | ORAL | 0 refills | Status: DC

## 2018-01-18 NOTE — Telephone Encounter (Signed)
RX Fax for [facility]@ [phone]  

## 2018-01-18 NOTE — Progress Notes (Signed)
Location:    [facility] Room Number: 158/P Place of Service:  SNF [phone]) Provider: Veleta Miners MD  [name], MD  Patient Care Team: [name], MD as PCP - General (Internal Medicine)  Extended Emergency Contact Information Primary Emergency Contact: Keil,Norma Address: [address]          [place], [place] [address] Johnnette Litter of [place] Phone: [phone] Mobile Phone: [phone] Relation: Spouse  Code Status: Full Code Goals of care: Advanced Directive information Advanced Directives 01/18/2018  Does Patient Have a Medical Advance Directive? Yes  Type of Advance Directive (No Data)  Does patient want to make changes to medical advance directive? No - Patient declined  Copy of [place] in Chart? No - copy requested  Would patient like information on creating a medical advance directive? -  Pre-existing out of facility DNR order (yellow form or pink MOST form) -     Chief Complaint  Patient presents with  . Acute Visit    F/U Antibiotic and DM    HPI:  Pt is a 83 y.o. male seen today for an acute visit for Follow up of his BS and Coumadin Management. Patient isinSNF for therapy and IV antibiotics.for concerning Right Mastoid Osteomyelitis   Patient has h/o Diabetes Mellitus, Diastolic CHF,Hypertension, Chronic Atrial Fibrillation on Chronic Coumadin, HLD, CLL and osteomyelitis of right heel now healed.  He hash/oChronic Right Ear Infection with Bells Palsy which later developed as Abscess requiringRightcanal-wall-down tympanomastoidectomyon 08/07. He came to follow up with ENT with Severe Pain and per patient draining PUS from his ear. His BS during this time has also been fluctuating from 200- 500.  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 And  discharged on 3 antibiotics for 6 weeks. Since his A1C was 9.5 he dose of Levimir was increased and was started on Sliding scale  and Bolus Novolog insulin.He was also on Amaryl, Actos and Victoza But in the Facility patient continued to have Hypoglycemia especially in afternoon. We eventually tapered him off his Amaryl. His Insulin was also decreased and Bolus was decreased. Patient is now doing well. He is finishing his Antibiotics on Mon. And planning to go home that day. His BS are now doing well and staying Below 150 He has followed By  [facility] weekly and they are using topical Antibiotics. We are also adjusting his Coumadin for his INR as his dose had to be adjusted due to Antibiotics. His wife also wanted his Vit B12 Checked. Patient denies any problems. He is walking with the walker. No Fever or Chills.   Past Medical History:  Diagnosis Date  . Atrial fibrillation ([place])   . Chronic lymphocytic leukemia ([place])   . Coronary atherosclerosis of native coronary artery    Nonobstructive  . Essential hypertension, benign   . Hyperlipidemia   . IgA nephropathy    Dr. Lowanda Foster  . Type 2 diabetes mellitus ([place])   . UTI (urinary tract infection) july  10 th   taking  med    Past Surgical History:  Procedure Laterality Date  . AMPUTATION Left 09/15/2012   Procedure: PARTIAL AMPUTATION 3rd TOE LEFT FOOT;  Surgeon: Marcheta Grammes, DPM;  [facility];  Service: Orthopedics;  Laterality: Left;  . AMPUTATION Left 04/20/2013   Procedure: PARTIAL AMPUTATION 2ND TOE LEFT FOOT;  Surgeon: Marcheta Grammes, DPM;  [facility];  Service: Orthopedics;  Laterality: Left;  . ANAL FISSURE  REPAIR    . APPLICATION OF WOUND VAC Right 09/09/2016   Procedure: APPLICATION OF WOUND VAC;  Surgeon: Caprice Beaver, DPM;  Location[facility];  Service: Podiatry;  Laterality: Right;  . CATARACT EXTRACTION W/PHACO Right 09/30/2015   Procedure: CATARACT EXTRACTION PHACO AND INTRAOCULAR LENS PLACEMENT (IOC);  Surgeon: Tonny Branch, MD;  Location[facility];  Service: Ophthalmology;  Laterality: Right;  CDE: 11.78  . CATARACT  EXTRACTION W/PHACO Left 10/31/2015   Procedure: CATARACT EXTRACTION PHACO AND INTRAOCULAR LENS PLACEMENT LEFT EYE; CDE:  9.10;  Surgeon: Tonny Branch, MD;  [facility];  Service: Ophthalmology;  Laterality: Left;  . COLONOSCOPY  08/19/2011   Procedure: COLONOSCOPY;  Surgeon: Rogene Houston, MD;  [facility];  Service: Endoscopy;  Laterality: N/A;  930  . COLONOSCOPY N/A 09/19/2014   Procedure: COLONOSCOPY;  Surgeon: Rogene Houston, MD;  [facility];  Service: Endoscopy;  Laterality: N/A;  1055  . FEMORAL HERNIA REPAIR    . INCISION AND DRAINAGE Right 09/09/2016   Procedure: DEBRIDEMENT WOUND RT heel with wound vac attachment;  Surgeon: Caprice Beaver, DPM;  [facility];  Service: Podiatry;  Laterality: Right;  . INCISION AND DRAINAGE OF WOUND Right 08/12/2016   Procedure: DEBRIDEMENT WOUND RIGHT HEEL;  Surgeon: Caprice Beaver, DPM;  [facility];  Service: Podiatry;  Laterality: Right;  right heel  . MASS EXCISION Right 09/28/2017   Procedure: EXCISION OF EXTERNAL AUDITORY CANAL MASS;  Surgeon: Leta Baptist, MD;  Location: [facility];  Service: ENT;  Laterality: Right;  . Open repair left quadriceps tendon.  08/22/07   Dr. Aline Brochure  . Right total knee replacement    . TRANSURETHRAL RESECTION OF PROSTATE    . TYMPANOMASTOIDECTOMY Right 11/10/2017   Procedure: RIGHT TYMPANOMASTOIDECTOMY;  Surgeon: Leta Baptist, MD;  Location: [facility];  Service: ENT;  Laterality: Right;  . WOUND EXPLORATION Right 08/12/2016   Procedure: EXPLORATION OF WOUND FOR FOREIGN BODY RIGHT HEEL;  Surgeon: Caprice Beaver, DPM;  [facility];  Service: Podiatry;  Laterality: Right;  right heel    Allergies  Allergen Reactions  . Erythromycin Other (See Comments)  . Penicillins Hives and Other (See Comments)    Has patient had a PCN reaction causing immediate rash, facial/tongue/throat swelling, SOB or lightheadedness with hypotension: No Has patient had a  PCN reaction causing severe rash involving mucus membranes or skin necrosis: No Has patient had a PCN reaction that required hospitalization No Has patient had a PCN reaction occurring within the last 10 years: No If all of the above answers are "NO", then may proceed with Cephalosporin use.  Has tolerated Rocephin    Outpatient Encounter Medications as of 01/18/2018  Medication Sig  . Ascorbic Acid (VITAMIN C) 1000 MG tablet Take 1,000 mg by mouth daily with breakfast.   . atorvastatin (LIPITOR) 10 MG tablet Take 10 mg by mouth every evening.  . Calcium Carb-Cholecalciferol (CALCIUM 600+D) 600-800 MG-UNIT TABS Take 2 tablets by mouth daily with breakfast.  . cefTRIAXone 2 g in sodium chloride 0.9 % 100 mL Inject 2 g into the vein daily. Last dose on 01/23/18  . cholecalciferol (VITAMIN D) 1000 units tablet Take 1,000 Units by mouth every morning.  . ciclopirox (LOPROX) 0.77 % cream Apply 1 application topically every morning. Applied to feet in the morning  . Coenzyme Q10 (CO Q-10) 100 MG CAPS Take 100 mg by mouth daily with breakfast.   . digoxin (LANOXIN) 0.125  MG tablet Take 0.125 mg by mouth daily with breakfast.   . finasteride (PROSCAR) 5 MG tablet Take 5 mg by mouth every morning.   . furosemide (LASIX) 20 MG tablet Take  20 mg by mouth on Sun., Tues., Thurs., Sat.,  Take 40 mg on Mon.Wed., Fri.  . insulin aspart (NOVOLOG FLEXPEN) 100 UNIT/ML FlexPen Give per sliding  From 0-4 units twice a day  . Insulin Detemir (LEVEMIR FLEXTOUCH) 100 UNIT/ML Pen Inject 60 Units into the skin daily.   Marland Kitchen lisinopril (PRINIVIL,ZESTRIL) 20 MG tablet Take 40 mg by mouth daily with supper.  Marland Kitchen LORazepam (ATIVAN) 1 MG tablet Take 1 tablet (1 mg total) by mouth at bedtime.  . metroNIDAZOLE (FLAGYL) 500 MG tablet Take 1 tablet (500 mg total) by mouth every 8 (eight) hours. Last dose on 01/23/18  . Multiple Vitamin (MULTIVITAMIN WITH MINERALS) TABS tablet Take 1 tablet by mouth daily with breakfast.  .  Omega-3 Fatty Acids (FISH OIL) 1200 MG CAPS Take 2 capsules by mouth 2 (two) times daily.  . pioglitazone (ACTOS) 15 MG tablet Take 15 mg by mouth every evening.   . potassium chloride SA (K-DUR,KLOR-CON) 20 MEQ tablet Take 20 mEq by mouth daily. Take starting 01/07/2018  . traMADol (ULTRAM) 50 MG tablet Take 1 tablet (50 mg total) by mouth every 6 (six) hours as needed for moderate pain.  . vancomycin 1,500 mg in sodium chloride 0.9 % 500 mL Inject 1,250 mg into the vein 2 (two) times daily.   Marland Kitchen VICTOZA 18 MG/3ML SOPN Inject 1.8 mg into the skin daily with breakfast.  . vitamin E 400 UNIT capsule Take 400 Units by mouth every morning.  . warfarin (COUMADIN) 1 MG tablet Take 0.5 mg by mouth along with 4 mg to = 4.5 mg on Sun., Mon., Tues., Thurs., Fri., Sat.  . warfarin (COUMADIN) 4 MG tablet Take 4 mg by mouth along with 0.5 mg to = 4.5 mg on Sun., Mon. Tues. Thurs. Fri., Sat.  . warfarin (COUMADIN) 5 MG tablet Take 5 mg by mouth daily. Take on Wednesday  . [DISCONTINUED] potassium chloride SA (K-DUR,KLOR-CON) 20 MEQ tablet Take 20 mEq by mouth 2 (two) times daily. Take from 01/04/2018-01/06/2018   No facility-administered encounter medications on file as of 01/18/2018.      Review of Systems  Constitutional: Negative.   HENT: Negative.   Respiratory: Negative.  Negative for shortness of breath.   Cardiovascular: Negative.   Gastrointestinal: Negative.   Genitourinary: Negative.   Musculoskeletal: Negative.   Skin: Negative.   Neurological: Negative.   Psychiatric/Behavioral: Negative.      There is no immunization history on file for this patient. Pertinent  Health Maintenance Due  Topic Date Due  . INFLUENZA VACCINE  01/19/2018 (Originally 11/04/2017)  . FOOT EXAM  01/19/2018 (Originally 10/14/1944)  . OPHTHALMOLOGY EXAM  01/19/2018 (Originally 10/14/1944)  . PNA vac Low Risk Adult (1 of 2 - PCV13) 01/19/2018 (Originally 10/13/2001)  . URINE MICROALBUMIN  01/26/2018 (Originally  10/14/1944)  . HEMOGLOBIN A1C  06/14/2018   No flowsheet data found. Functional Status Survey:    Vitals:   01/18/18 1100  BP: (!) 160/81  Pulse: 81  Resp: 20  Temp: 98.2 F (36.8 C)  TempSrc: Oral   There is no height or weight on file to calculate BMI. Physical Exam  Constitutional: He is oriented to person, place, and time. He appears well-developed and well-nourished.  HENT:  Head: Normocephalic.  Oral Thrush Present  Eyes: Pupils  are equal, round, and reactive to light.  Neck: Neck supple.  Cardiovascular: Normal rate. An irregular rhythm present.  Pulmonary/Chest: Effort normal and breath sounds normal. No stridor. No respiratory distress. He has no wheezes.  Abdominal: Soft. Bowel sounds are normal. He exhibits no distension. There is no tenderness. There is no guarding.  Musculoskeletal:  Mild edema Bilateral with Chronic Venous changes  Neurological: He is alert and oriented to person, place, and time.  Walking with Gilford Rile . Right sided facial Droop due to Bells Palsy.  Skin: Skin is warm and dry.  Psychiatric: He has a normal mood and affect. His behavior is normal. Thought content normal.    Labs reviewed: Recent Labs    01/07/18 0700 01/11/18 0555 01/17/18 0830  NA 136 135 134*  K 4.0 4.2 3.7  CL 97* 96* 96*  CO2 30 31 28   GLUCOSE 170* 129* 160*  BUN 11 12 13   CREATININE 0.67 0.71 0.69  CALCIUM 9.0 9.0 9.2   Recent Labs    06/29/17 1400 11/04/17 2028 12/14/17 0527  AST 32 18 19  ALT 47 20 17  ALKPHOS 97 86 98  BILITOT 1.2 1.3* 0.8  PROT 6.1* 6.1* 6.2*  ALBUMIN 3.3* 3.5 3.1*   Recent Labs    12/13/17 1431  12/30/17 0700 01/11/18 0555 01/17/18 0830  WBC 19.5*   < > 13.1* 14.6* 16.2*  NEUTROABS 7.4  --  4.8 4.8  --   HGB 13.6   < > 12.1* 13.2 13.1  HCT 40.9   < > 36.4* 40.2 40.8  MCV 87.0   < > 89.7 89.3 89.3  PLT 301   < > 281 304 347   < > = values in this interval not displayed.   No results found for: TSH Lab Results  Component  Value Date   HGBA1C 9.5 (H) 12/14/2017   Lab Results  Component Value Date   CHOL 124 10/06/2016   HDL 23 (L) 10/06/2016   LDLCALC 71 10/06/2016   TRIG 150 (H) 10/06/2016   CHOLHDL 5.4 10/06/2016    Significant Diagnostic Results in last 30 days:  Dg Op Swallowing Func-medicare/speech Path  Result Date: 01/06/2018 [place] [address] [place], [place], [address] Phone: [phone]   Fax:  [phone] Modified Barium Swallow Patient Details Name: [name] [name] MRN: [id number] Date of Birth: [birth year] No data recorded Encounter Date: 01/06/2018 Past Medical History: Diagnosis Date . Atrial fibrillation ([place])  . Chronic lymphocytic leukemia ([facility])  . Coronary atherosclerosis of native coronary artery   Nonobstructive . Essential hypertension, benign  . Hyperlipidemia  . IgA nephropathy   Dr. Lowanda Foster . Type 2 diabetes mellitus ([place])  . UTI (urinary tract infection) july  10 th  taking  med  Past Surgical History: Procedure Laterality Date . AMPUTATION Left 09/15/2012  Procedure: PARTIAL AMPUTATION 3rd TOE LEFT FOOT;  Surgeon: Marcheta Grammes, DPM;  [facility];  Service: Orthopedics;  Laterality: Left; . AMPUTATION Left 04/20/2013  Procedure: PARTIAL AMPUTATION 2ND TOE LEFT FOOT;  Surgeon: Marcheta Grammes, DPM;  [facility];  Service: Orthopedics;  Laterality: Left; . ANAL FISSURE REPAIR   . APPLICATION OF WOUND VAC Right 05/11/4268  Procedure: APPLICATION OF WOUND VAC;  Surgeon: Caprice Beaver, DPM;  [facility];  Service: Podiatry;  Laterality: Right; . CATARACT EXTRACTION W/PHACO Right 09/30/2015  Procedure: CATARACT EXTRACTION PHACO AND INTRAOCULAR LENS PLACEMENT (IOC);  Surgeon: Tonny Branch, MD;  [facility];  Service: Ophthalmology;  Laterality: Right;  CDE: 11.78 . CATARACT EXTRACTION W/PHACO Left 10/31/2015  Procedure: CATARACT EXTRACTION PHACO AND INTRAOCULAR LENS PLACEMENT LEFT EYE; CDE:  9.10;  Surgeon: Tonny Branch, MD;   [facility];  Service: Ophthalmology;  Laterality: Left; . COLONOSCOPY  08/19/2011  Procedure: COLONOSCOPY;  Surgeon: Rogene Houston, MD;  [facility];  Service: Endoscopy;  Laterality: N/A;  930 . COLONOSCOPY N/A 09/19/2014  Procedure: COLONOSCOPY;  Surgeon: Rogene Houston, MD;  [facility];  Service: Endoscopy;  Laterality: N/A;  1055 . FEMORAL HERNIA REPAIR   . INCISION AND DRAINAGE Right 09/09/2016  Procedure: DEBRIDEMENT WOUND RT heel with wound vac attachment;  Surgeon: Caprice Beaver, DPM;  [facility];  Service: Podiatry;  Laterality: Right; . INCISION AND DRAINAGE OF WOUND Right 08/12/2016  Procedure: DEBRIDEMENT WOUND RIGHT HEEL;  Surgeon: Caprice Beaver, DPM;  [facility];  Service: Podiatry;  Laterality: Right;  right heel . MASS EXCISION Right 09/28/2017  Procedure: EXCISION OF EXTERNAL AUDITORY CANAL MASS;  Surgeon: Leta Baptist, MD;  Location: [facility];  Service: ENT;  Laterality: Right; . Open repair left quadriceps tendon.  08/22/07  Dr. Aline Brochure . Right total knee replacement   . TRANSURETHRAL RESECTION OF PROSTATE   . TYMPANOMASTOIDECTOMY Right 11/10/2017  Procedure: RIGHT TYMPANOMASTOIDECTOMY;  Surgeon: Leta Baptist, MD;  Location: [facility];  Service: ENT;  Laterality: Right; . WOUND EXPLORATION Right 08/12/2016  Procedure: EXPLORATION OF WOUND FOR FOREIGN BODY RIGHT HEEL;  Surgeon: Caprice Beaver, DPM;  [facility];  Service: Podiatry;  Laterality: Right;  right heel There were no vitals filed for this visit. Subjective Assessment - 01/06/18 1539   Subjective  "He has been coughing a lot with meals."   Special Tests  MBSS   Currently in Pain?  No/denies   General - 01/06/18 1540    General Information  Date of Onset  11/10/17   HPI  83 y.o. male with hx of DM2, UTI, IgA neph, HTN, afib , CLL and CAD direct admit from Dr Deeann Saint ENT office for mastoiditis/ OM w/ facial nerve palsy failing outpt abx. Subsequently had  Tympanomastoidectomy on 11/10/17 after hospitalization for IV abx (8/1-8/6).  3 weeks after surgery, pt noted increasing pain and drainage from right ear.  Had office follow up 12/13/17-->sent to hospital for IV abx. He was then discharged to [facility] for continued antibiotics. Pt is referred by Dr. Veleta Miners for MBSS due to increased coughing with meals. He was accompanied to the MBSS by his treating SLP.    Type of Study  MBS-Modified Barium Swallow Study   Previous Swallow Assessment  None on record   Diet Prior to this Study  Regular;Thin liquids  Pt refused ground  Temperature Spikes Noted  No   Respiratory Status  Room air   History of Recent Intubation  No   Behavior/Cognition  Alert;Cooperative;Pleasant mood   Oral Cavity Assessment  Excessive secretions   Oral Care Completed by SLP  No   Oral Cavity - Dentition  Adequate natural dentition   Vision  Functional for self feeding   Self-Feeding Abilities  Able to feed self   Baseline Vocal Quality  Normal   Volitional Cough  Strong   Volitional Swallow  Able to elicit   Anatomy  Within functional limits   Pharyngeal Secretions  Not observed secondary MBS   Oral Preparation/Oral Phase - 01/06/18 1541    Oral Preparation/Oral Phase  Oral Phase  Impaired    Oral - Thin  Oral - Thin Cup  Decreased bolus cohesion;Oral residue    Oral - Solids  Oral - Puree  Decreased bolus cohesion;Oral residue;Piecemeal swallowing   Oral - Regular  Decreased bolus cohesion;Oral residue;Piecemeal swallowing;Delayed A-P transit    Electrical stimulation - Oral Phase  Was Electrical Stimulation Used  No   Pharyngeal Phase - 01/06/18 1542    Pharyngeal Phase  Pharyngeal Phase  Impaired    Pharyngeal - Nectar  Pharyngeal- Nectar Cup  Swallow initiation at pyriform sinus;Delayed swallow initiation;Reduced epiglottic inversion;Reduced tongue base retraction;Penetration/Aspiration during swallow;Pharyngeal residue - valleculae;Pharyngeal residue - pyriform;Pharyngeal residue - cp segment    Pharyngeal  Material enters airway, remains ABOVE vocal cords and not ejected out;Material does not enter airway    Pharyngeal - Thin  Pharyngeal- Thin Teaspoon  Swallow initiation at vallecula   Pharyngeal- Thin Cup  Delayed swallow initiation;Swallow initiation at vallecula;Penetration/Aspiration during swallow;Reduced tongue base retraction;Reduced epiglottic inversion;Pharyngeal residue - valleculae;Pharyngeal residue - pyriform;Pharyngeal residue - cp segment;Compensatory strategies attempted (with notebox)  chin tuck ineffective  Pharyngeal  Material does not enter airway;Material enters airway, remains ABOVE vocal cords and not ejected out;Material enters airway, CONTACTS cords and then ejected out    Pharyngeal - Solids  Pharyngeal- Puree  Delayed swallow initiation;Reduced epiglottic inversion;Reduced tongue base retraction;Reduced pharyngeal peristalsis;Pharyngeal residue - pyriform;Pharyngeal residue - valleculae;Pharyngeal residue - posterior pharnyx;Compensatory strategies attempted (with notebox)  effortful swallow and head turns ineffective  Pharyngeal- Regular  Delayed swallow initiation-vallecula;Reduced epiglottic inversion;Reduced anterior laryngeal mobility;Reduced tongue base retraction;Pharyngeal residue - valleculae;Pharyngeal residue - pyriform;Pharyngeal residue - posterior pharnyx   Pharyngeal- Pill  Within functional limits    Electrical Stimulation - Pharyngeal Phase  Was Electrical Stimulation Used  No   Cricopharyngeal Phase - 01/06/18 1555    Cervical Esophageal Phase  Cervical Esophageal Phase  Within functional limits   Plan - 01/06/18 1557   Clinical Impression Statement  Pt presents with moderate oropharyngeal phase dysphagia in setting of acute Bell's Palsy characterized by decreased bolus manipulation and cohesion, oral scatter/residue and piecemeal deglutition, delay in swallow initiation with swallow trigger after filling the valleculae, reduced tongue base retraction,  epiglottic deflection, and posterior pharyngeal wall constriction resulting in piecemeal deglutition, penetration during the swallow with trace amount to the cords without aspiration and residuals in the valleculae, posterior pharyngeal wall, and pyriforms post swallow. Although gross aspiration was not observed, Pt is at risk for aspiration due to severity of pharyngeal residuals across textures and consistencies. There was no perceivable benefit to NTL over thins, however regular texture residuals were greater in the valleculae than with puree textures. Recommend D3/mech soft with thin liquids via small cup sips, no straws, repeat swallows for each bite/sip, and clear throat periodically. Pt will need to be monitored with meals due to aspiration risk.   Patient will benefit from skilled therapeutic intervention in order to improve the following deficits and impairments:  Dysphagia, oropharyngeal phase Recommendations/Treatment - 01/06/18 1555    Swallow Evaluation Recommendations  SLP Diet Recommendations  Dysphagia 3 (mechanical soft);Thin   Liquid Administration via  Cup;No straw   Medication Administration  Whole meds with liquid   Supervision  Patient able to self feed;Full supervision/cueing for compensatory strategies   Compensations  Slow rate;Small sips/bites;Multiple dry swallows after each bite/sip   Postural Changes  Seated upright at 90 degrees;Remain upright for at least 30 minutes after feeds/meals   Prognosis - 01/06/18 1557    Prognosis  Prognosis for  Safe Diet Advancement  Guarded   Barriers to Reach Goals  Severity of deficits    Individuals Consulted  Consulted and Agree with Results and Recommendations  Patient   Report Sent to   Referring physician;Primary SLP   Problem List Patient Active Problem List  Diagnosis Date Noted . Status post peripherally inserted central catheter (PICC) central line placement  . Hemorrhagic otitis externa of right ear 12/13/2017 . Hemorrhagic otitis externa  12/13/2017 . Uncontrolled type 2 diabetes mellitus with hyperglycemia, with long-term current use of insulin ([place]) 11/06/2017 . Acute mastoiditis 11/05/2017 . Atrial fibrillation, chronic 11/05/2017 . Chronic diastolic CHF (congestive heart failure) ([place]) 11/05/2017 . Mastoiditis 11/04/2017 . Disorder of right facial nerve 11/04/2017 . Facial nerve palsy 11/04/2017 . Essential hypertension 09/10/2016 . Rt Heel Osteomyelitis ([place]) 09/08/2016 . Cellulitis of heel, right 09/08/2016 . Hypoglycemia 06/21/2016 . Type 2 diabetes mellitus ([place])  . Chronic lymphocytic leukemia ([place])  . Encounter for therapeutic drug monitoring 05/16/2013 . Cardiac murmur 10/21/2011 . Long term current use of anticoagulant therapy 06/27/2010 . Mixed hyperlipidemia 05/21/2008 . CORONARY ATHEROSCLEROSIS NATIVE CORONARY ARTERY 05/21/2008 . Atrial fibrillation ([facility] [place]) 05/21/2008 . KNEE PAIN 06/14/2007 Thank you, Genene Churn, [place] 01/06/2018, 4:09 PM [place], [place], [address] Phone: [phone]   Fax:  [phone] Name: SAIQUAN HANDS MRN: [id number] Date of Birth: [birth year] CLINICAL DATA:  Dysphagia, Bell's palsy, lot of mucus EXAM: MODIFIED BARIUM SWALLOW TECHNIQUE: Different consistencies of barium were administered orally to the patient by the Speech Pathologist. Imaging of the pharynx was performed in the lateral projection. FLUOROSCOPY TIME:  Fluoroscopy Time: 5 minutes 36 seconds Radiation Exposure Index (if provided by the fluoroscopic device): 38.8 mGy Number of Acquired Spot Images: multiple fluoroscopic screen captures COMPARISON:  None FINDINGS: Thin liquid-multiple swallows of thin liquid were assessed. Tiny amount by teaspoon showed no laryngeal penetration or aspiration. Multiple swallows by cup showed laryngeal penetration to the level of the vocal cords. No aspiration or spontaneous cough. Laryngeal penetration occurred with head  turned to the LEFT and RIGHT, and with chin tuck. Vallecular and piriform sinus residuals, slightly greater on LEFT Nectar thick liquid-minimal laryngeal penetration without aspiration. Vallecular and piriform sinus residuals. Honey-not evaluated Pure-vallecular and slightly less piriform sinus residuals. No laryngeal penetration or aspiration. Cracker-significant vallecular and piriform sinus residuals. No laryngeal penetration or aspiration. Pure with cracker-not evaluated Barium tablet-laryngeal penetration of the thin barium utilized to swallow the barium tablet. Tablet was swallowed without difficulty. No aspiration. Tablet passed to the stomach without esophageal obstruction. IMPRESSION: Swallowing dysfunction as above. Please refer to the Speech Pathologists report for complete details and recommendations. Electronically Signed   By: Lavonia Dana M.D.   On: 01/06/2018 14:18    Assessment/Plan Acute mastoiditis  Patient on 3 antibiotics  Ceftriaxone, Vanco and Flogyl till 10/20 Clinically doing very well. White Count still Elevated. He is afebrile Otherwise and feels good. Following with ENT Facial nerve palsy Continue to use Patch and Keeping it mosit  Uncontrolled type 2 diabetes mellitus  On All his Meds including Insulin, Sliding Scale, Victoza, Actos Except Amaryl BS are doing well now. Continue same doses. Wife said they are comfortable using Sliding scale at home  Atrial fibrillation, chronic  INR therapeutic Continue Coumadin. D/W the Wife that dose has been Changed due to his Antibiotics. Repeat INR Q 3 Days Rate Controlled on Digoxin  Chronic diastolic CHF (congestive heart failure)  Stable on Lasix and Lisinopril  His dose of Lasix was increased due to SOB and LE edema. He is doing well now His weight is down almost 10 lbs and is now 250 lbs. Renal Function stable  Essential hypertension Slightly High Today will continue to monitor  Oral Thrush Will restart  Oral Nystatin Dysphagia D/w Speech and he does have dysphagia not related to Bells Palsy He is on Modified Diet    Family/ staff Communication:   Labs/tests ordered:  Total time spent in this patient care encounter was 45_ minutes; greater than 50% of the visit spent counseling patient, reviewing records , Labs and coordinating care for problems addressed at this encounter.

## 2018-01-20 ENCOUNTER — Encounter (HOSPITAL_COMMUNITY)
Admission: RE | Admit: 2018-01-20 | Discharge: 2018-01-20 | Disposition: A | Discharge status: Home / Self Care | Payer: Medicare Other | Source: Skilled Nursing Facility | Attending: Internal Medicine | Admitting: Internal Medicine

## 2018-01-20 LAB — BASIC METABOLIC PANEL
ANION GAP: 10 (ref 5–15)
BUN: 16 mg/dL (ref 8–23)
CO2: 29 mmol/L (ref 22–32)
CREATININE: 0.78 mg/dL (ref 0.61–1.24)
Calcium: 9.1 mg/dL (ref 8.9–10.3)
Chloride: 96 mmol/L — ABNORMAL LOW (ref 98–111)
GFR calc non Af Amer: >60 mL/min (ref >60)
Glucose, Bld: 209 mg/dL — ABNORMAL HIGH (ref 70–99)
Potassium: 3.2 mmol/L — ABNORMAL LOW (ref 3.5–5.1)
Sodium: 135 mmol/L (ref 135–145)

## 2018-01-20 LAB — CBC
HCT: 40.1 % (ref 39.0–52.0)
Hemoglobin: 12.4 g/dL — ABNORMAL LOW (ref 13.0–17.0)
MCH: 27.1 pg (ref 26.0–34.0)
MCHC: 30.9 g/dL (ref 30.0–36.0)
MCV: 87.6 fL (ref 80.0–100.0)
PLATELETS: 272 10*3/uL (ref 150–400)
RBC: 4.58 MIL/uL (ref 4.22–5.81)
RDW: 15 % (ref 11.5–15.5)
WBC: 12.4 10*3/uL — ABNORMAL HIGH (ref 4.0–10.5)
nRBC: 0 % (ref 0.0–0.2)

## 2018-01-20 LAB — PROTIME-INR
INR: 2.78
Prothrombin Time: 28.9 seconds — ABNORMAL HIGH (ref 11.4–15.2)

## 2018-01-20 LAB — VANCOMYCIN, TROUGH: VANCOMYCIN TR: 20 ug/mL (ref 15–20)

## 2018-01-20 LAB — VITAMIN B12: VITAMIN B 12: 698 pg/mL (ref 180–914)

## 2018-01-22 LAB — VITAMIN B1: VITAMIN B1 (THIAMINE): 106.9 nmol/L (ref 66.5–200.0)

## 2018-01-24 ENCOUNTER — Encounter: Payer: Self-pay | Admitting: Internal Medicine

## 2018-01-24 ENCOUNTER — Encounter (HOSPITAL_COMMUNITY)
Admission: RE | Admit: 2018-01-24 | Discharge: 2018-01-24 | Disposition: A | Discharge status: Home / Self Care | Payer: Medicare Other | Source: Skilled Nursing Facility | Attending: Internal Medicine | Admitting: Internal Medicine

## 2018-01-24 ENCOUNTER — Non-Acute Institutional Stay (SKILLED_NURSING_FACILITY): Payer: Medicare Other | Admitting: Internal Medicine

## 2018-01-24 ENCOUNTER — Ambulatory Visit: Payer: Medicare Other | Admitting: Cardiology

## 2018-01-24 ENCOUNTER — Ambulatory Visit (INDEPENDENT_AMBULATORY_CARE_PROVIDER_SITE_OTHER): Payer: Medicare Other | Admitting: Otolaryngology

## 2018-01-24 DIAGNOSIS — M868X8 Other osteomyelitis, other site: Secondary | ICD-10-CM | POA: Insufficient documentation

## 2018-01-24 DIAGNOSIS — I482 Chronic atrial fibrillation, unspecified: Secondary | ICD-10-CM | POA: Diagnosis not present

## 2018-01-24 DIAGNOSIS — E1165 Type 2 diabetes mellitus with hyperglycemia: Secondary | ICD-10-CM | POA: Diagnosis not present

## 2018-01-24 DIAGNOSIS — Z794 Long term (current) use of insulin: Secondary | ICD-10-CM

## 2018-01-24 DIAGNOSIS — H70001 Acute mastoiditis without complications, right ear: Secondary | ICD-10-CM | POA: Diagnosis not present

## 2018-01-24 DIAGNOSIS — I5032 Chronic diastolic (congestive) heart failure: Secondary | ICD-10-CM

## 2018-01-24 DIAGNOSIS — I69822 Dysarthria following other cerebrovascular disease: Secondary | ICD-10-CM | POA: Insufficient documentation

## 2018-01-24 DIAGNOSIS — Z48810 Encounter for surgical aftercare following surgery on the sense organs: Secondary | ICD-10-CM | POA: Insufficient documentation

## 2018-01-24 LAB — BASIC METABOLIC PANEL
Anion gap: 10 (ref 5–15)
BUN: 17 mg/dL (ref 8–23)
CHLORIDE: 96 mmol/L — AB (ref 98–111)
CO2: 28 mmol/L (ref 22–32)
CREATININE: 0.63 mg/dL (ref 0.61–1.24)
Calcium: 8.9 mg/dL (ref 8.9–10.3)
GFR calc Af Amer: >60 mL/min (ref >60)
GFR calc non Af Amer: >60 mL/min (ref >60)
GLUCOSE: 193 mg/dL — AB (ref 70–99)
Potassium: 3.7 mmol/L (ref 3.5–5.1)
SODIUM: 134 mmol/L — AB (ref 135–145)

## 2018-01-24 LAB — PROTIME-INR
INR: 3.36
Prothrombin Time: 33.5 seconds — ABNORMAL HIGH (ref 11.4–15.2)

## 2018-01-24 NOTE — Progress Notes (Signed)
Location:    [facility] Room Number: 158/P Place of Service:  SNF [phone])  Provider: Veleta Miners MD  PCP: [name], MD Patient Care Team: [name], MD as PCP - General (Internal Medicine)  Extended Emergency Contact Information Primary Emergency Contact: Colocho,Norma Address: [address]          [place], [place] [address] Johnnette Litter of [place] Phone: [phone] Mobile Phone: [phone] Relation: Spouse  Code Status: Full Code Goals of care:  Advanced Directive information Advanced Directives 01/24/2018  Does Patient Have a Medical Advance Directive? Yes  Type of Advance Directive (No Data)  Does patient want to make changes to medical advance directive? No - Patient declined  Copy of [place] in Chart? No - copy requested  Would patient like information on creating a medical advance directive? -  Pre-existing out of facility DNR order (yellow form or pink MOST form) -     Allergies  Allergen Reactions  . Erythromycin Other (See Comments)  . Penicillins Hives and Other (See Comments)    Has patient had a PCN reaction causing immediate rash, facial/tongue/throat swelling, SOB or lightheadedness with hypotension: No Has patient had a PCN reaction causing severe rash involving mucus membranes or skin necrosis: No Has patient had a PCN reaction that required hospitalization No Has patient had a PCN reaction occurring within the last 10 years: No If all of the above answers are "NO", then may proceed with Cephalosporin use.  Has tolerated Rocephin    Chief Complaint  Patient presents with  . Discharge Note    Discharge Visit    HPI:  83 y.o. male  Seen Today for discharge  Patient was admitted to SNF for therapy and IV antibiotics.  Patient has h/o Diabetes Mellitus, Diastolic CHF,Hypertension, Chronic Atrial Fibrillation on Chronic Coumadin, HLD, CLL and  H/O osteomyelitis of right heel now healed. He hash/oChronic  Right Ear Infection with Bells Palsy which later developed as Abscess requiringRightcanal-wall-down tympanomastoidectomyon 08/07. He came to follow up with ENT with Severe Pain and per patient draining PUS from his ear. His BS during this time has also been fluctuating from 200- 500.  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 And  discharged on 3 antibiotics for 6 weeks. Since his A1C was 9.5 he dose of Levimir was increased and was started on Sliding scale and Bolus Novolog insulin.He was also on Amaryl, Actos and Victoza But in the Facility patient continued to have Hypoglycemia especially in afternoon. We eventually tapered him off his Amaryl. His Insulin was also decreased and Bolus was decreased. Patient Finished his Antibiotics today. He continues to  be Afebrile . His wife was with him and taking him home today. He is walking with the Walker.   Past Medical History:  Diagnosis Date  . Atrial fibrillation ([place])   . Chronic lymphocytic leukemia ([place])   . Coronary atherosclerosis of native coronary artery    Nonobstructive  . Essential hypertension, benign   . Hyperlipidemia   . IgA nephropathy    Dr. Lowanda Foster  . Type 2 diabetes mellitus ([place])   . UTI (urinary tract infection) july  10 th   taking  med     Past Surgical History:  Procedure Laterality Date  . AMPUTATION Left 09/15/2012   Procedure: PARTIAL AMPUTATION 3rd TOE LEFT FOOT;  Surgeon: Marcheta Grammes, DPM;  [facility];  Service: Orthopedics;  Laterality: Left;  . AMPUTATION Left  04/20/2013   Procedure: PARTIAL AMPUTATION 2ND TOE LEFT FOOT;  Surgeon: Marcheta Grammes, DPM;  [facility];  Service: Orthopedics;  Laterality: Left;  . ANAL FISSURE REPAIR    . APPLICATION OF WOUND VAC Right 09/09/2016   Procedure: APPLICATION OF WOUND VAC;  Surgeon: Caprice Beaver, DPM;  Location[facility];  Service: Podiatry;  Laterality: Right;  . CATARACT EXTRACTION  W/PHACO Right 09/30/2015   Procedure: CATARACT EXTRACTION PHACO AND INTRAOCULAR LENS PLACEMENT (IOC);  Surgeon: Tonny Branch, MD;  Location: [facility];  Service: Ophthalmology;  Laterality: Right;  CDE: 11.78  . CATARACT EXTRACTION W/PHACO Left 10/31/2015   Procedure: CATARACT EXTRACTION PHACO AND INTRAOCULAR LENS PLACEMENT LEFT EYE; CDE:  9.10;  Surgeon: Tonny Branch, MD;  [facility];  Service: Ophthalmology;  Laterality: Left;  . COLONOSCOPY  08/19/2011   Procedure: COLONOSCOPY;  Surgeon: Rogene Houston, MD;  [facility];  Service: Endoscopy;  Laterality: N/A;  930  . COLONOSCOPY N/A 09/19/2014   Procedure: COLONOSCOPY;  Surgeon: Rogene Houston, MD;  [facility];  Service: Endoscopy;  Laterality: N/A;  1055  . FEMORAL HERNIA REPAIR    . INCISION AND DRAINAGE Right 09/09/2016   Procedure: DEBRIDEMENT WOUND RT heel with wound vac attachment;  Surgeon: Caprice Beaver, DPM;  [facility];  Service: Podiatry;  Laterality: Right;  . INCISION AND DRAINAGE OF WOUND Right 08/12/2016   Procedure: DEBRIDEMENT WOUND RIGHT HEEL;  Surgeon: Caprice Beaver, DPM;  Location[facility];  Service: Podiatry;  Laterality: Right;  right heel  . MASS EXCISION Right 09/28/2017   Procedure: EXCISION OF EXTERNAL AUDITORY CANAL MASS;  Surgeon: Leta Baptist, MD;  Location: [facility];  Service: ENT;  Laterality: Right;  . Open repair left quadriceps tendon.  08/22/07   Dr. Aline Brochure  . Right total knee replacement    . TRANSURETHRAL RESECTION OF PROSTATE    . TYMPANOMASTOIDECTOMY Right 11/10/2017   Procedure: RIGHT TYMPANOMASTOIDECTOMY;  Surgeon: Leta Baptist, MD;  Location: [facility];  Service: ENT;  Laterality: Right;  . WOUND EXPLORATION Right 08/12/2016   Procedure: EXPLORATION OF WOUND FOR FOREIGN BODY RIGHT HEEL;  Surgeon: Caprice Beaver, DPM;  [facility];  Service: Podiatry;  Laterality: Right;  right heel      reports that he quit smoking about 54 years ago.  His smoking use included cigarettes. He quit after 10.00 years of use. He has never used smokeless tobacco. He reports that he does not drink alcohol or use drugs. Social History   Socioeconomic History  . Marital status: Married    Spouse name: Not on file  . Number of children: Not on file  . Years of education: Not on file  . Highest education level: Not on file  Occupational History  . Occupation: Retired    Fish farm manager: RETIRED    Comment: [employer]  . Financial resource strain: Not on file  . Food insecurity:    Worry: Not on file    Inability: Not on file  . Transportation needs:    Medical: Not on file    Non-medical: Not on file  Tobacco Use  . Smoking status: Former Smoker    Years: 10.00    Types: Cigarettes    Last attempt to quit: 04/07/1963    Years since quitting: 54.8  . Smokeless tobacco: Never Used  Substance and Sexual Activity  . Alcohol use: No    Alcohol/week: 0.0 standard drinks  .  Drug use: No  . Sexual activity: Not on file  Lifestyle  . Physical activity:    Days per week: Not on file    Minutes per session: Not on file  . Stress: Not on file  Relationships  . Social connections:    Talks on phone: Not on file    Gets together: Not on file    Attends religious service: Not on file    Active member of club or organization: Not on file    Attends meetings of clubs or organizations: Not on file    Relationship status: Not on file  . Intimate partner violence:    Fear of current or ex partner: Not on file    Emotionally abused: Not on file    Physically abused: Not on file    Forced sexual activity: Not on file  Other Topics Concern  . Not on file  Social History Narrative  . Not on file   Functional Status Survey:    Allergies  Allergen Reactions  . Erythromycin Other (See Comments)  . Penicillins Hives and Other (See Comments)    Has patient had a PCN reaction causing immediate rash, facial/tongue/throat swelling, SOB or  lightheadedness with hypotension: No Has patient had a PCN reaction causing severe rash involving mucus membranes or skin necrosis: No Has patient had a PCN reaction that required hospitalization No Has patient had a PCN reaction occurring within the last 10 years: No If all of the above answers are "NO", then may proceed with Cephalosporin use.  Has tolerated Rocephin    Pertinent  Health Maintenance Due  Topic Date Due  . FOOT EXAM  10/14/1944  . OPHTHALMOLOGY EXAM  10/14/1944  . PNA vac Low Risk Adult (1 of 2 - PCV13) 10/13/2001  . INFLUENZA VACCINE  11/04/2017  . URINE MICROALBUMIN  01/26/2018 (Originally 10/14/1944)  . HEMOGLOBIN A1C  06/14/2018    Medications: Outpatient Encounter Medications as of 01/24/2018  Medication Sig  . Ascorbic Acid (VITAMIN C) 1000 MG tablet Take 1,000 mg by mouth daily with breakfast.   . atorvastatin (LIPITOR) 10 MG tablet Take 10 mg by mouth every evening.  . Calcium Carb-Cholecalciferol (CALCIUM 600+D) 600-800 MG-UNIT TABS Take 2 tablets by mouth daily with breakfast.  . cholecalciferol (VITAMIN D) 1000 units tablet Take 1,000 Units by mouth every morning.  . ciclopirox (LOPROX) 0.77 % cream Apply 1 application topically every morning. Applied to feet in the morning  . Coenzyme Q10 (CO Q-10) 100 MG CAPS Take 100 mg by mouth daily with breakfast.   . digoxin (LANOXIN) 0.125 MG tablet Take 0.125 mg by mouth daily with breakfast.   . finasteride (PROSCAR) 5 MG tablet Take 5 mg by mouth every morning.   . furosemide (LASIX) 20 MG tablet Take  20 mg by mouth on Sun., Tues., Thurs., Sat.,  Take 40 mg on Mon.Wed., Fri.  . insulin aspart (NOVOLOG FLEXPEN) 100 UNIT/ML FlexPen Give per sliding  From 0-4 units twice a day  . Insulin Detemir (LEVEMIR FLEXTOUCH) 100 UNIT/ML Pen Inject 60 Units into the skin daily.   Marland Kitchen lisinopril (PRINIVIL,ZESTRIL) 20 MG tablet Take 40 mg by mouth daily with supper.  Marland Kitchen LORazepam (ATIVAN) 1 MG tablet Take 1 tablet (1 mg total)  by mouth at bedtime.  . Multiple Vitamin (MULTIVITAMIN WITH MINERALS) TABS tablet Take 1 tablet by mouth daily with breakfast.  . nystatin (MYCOSTATIN) 100000 UNIT/ML suspension Take 5 mLs by mouth 3 (three) times daily. Take for 7  days stop date 01/24/2018  . Omega-3 Fatty Acids (FISH OIL) 1200 MG CAPS Take 2 capsules by mouth 2 (two) times daily.  . pioglitazone (ACTOS) 15 MG tablet Take 15 mg by mouth every evening.   . potassium chloride SA (K-DUR,KLOR-CON) 20 MEQ tablet Take 40 mEq by mouth daily. Take starting 01/07/2018   . traMADol (ULTRAM) 50 MG tablet Take 1 tablet (50 mg total) by mouth every 6 (six) hours as needed for moderate pain.  . vancomycin 1,500 mg in sodium chloride 0.9 % 500 mL Inject 1,250 mg into the vein 2 (two) times daily.   Marland Kitchen VICTOZA 18 MG/3ML SOPN Inject 1.8 mg into the skin daily with breakfast.  . vitamin E 400 UNIT capsule Take 400 Units by mouth every morning.  . warfarin (COUMADIN) 1 MG tablet Take 0.5 mg by mouth along with 4 mg to = 4.5 mg on Sun., Mon., Tues., Thurs., Fri., Sat.  . warfarin (COUMADIN) 4 MG tablet Take 4 mg by mouth along with 0.5 mg to = 4.5 mg on Sun., Mon. Tues. Thurs. Fri., Sat.  . warfarin (COUMADIN) 5 MG tablet Take 5 mg by mouth daily. Take on Wednesday  . [DISCONTINUED] cefTRIAXone 2 g in sodium chloride 0.9 % 100 mL Inject 2 g into the vein daily. Last dose on 01/23/18  . [DISCONTINUED] metroNIDAZOLE (FLAGYL) 500 MG tablet Take 1 tablet (500 mg total) by mouth every 8 (eight) hours. Last dose on 01/23/18   No facility-administered encounter medications on file as of 01/24/2018.      Review of Systems  Vitals:   01/24/18 1355  BP: 139/66  Pulse: 93  Resp: 18  Temp: 97.6 F (36.4 C)  TempSrc: Oral   There is no height or weight on file to calculate BMI. Physical Exam  Constitutional: He is oriented to person, place, and time. He appears well-developed and well-nourished.  HENT:  Head: Normocephalic.  Oral Thrush improve    Eyes: Pupils are equal, round, and reactive to light.  Neck: Neck supple.  Cardiovascular: Normal rate. An irregular rhythm present.  Pulmonary/Chest: Effort normal and breath sounds normal. No stridor. No respiratory distress. He has no wheezes.  Abdominal: Soft. Bowel sounds are normal. He exhibits no distension. There is no tenderness. There is no guarding.  Musculoskeletal:  Mild edema Bilateral with Chronic Venous changes  Neurological: He is alert and oriented to person, place, and time.  Walking with Gilford Rile . Right sided facial Droop due to Bells Palsy.  Skin: Skin is warm and dry.  Psychiatric: He has a normal mood and affect. His behavior is normal. Thought content normal.    Labs reviewed: Basic Metabolic Panel: Recent Labs    01/17/18 0830 01/20/18 0730 01/24/18 0832  NA 134* 135 134*  K 3.7 3.2* 3.7  CL 96* 96* 96*  CO2 28 29 28   GLUCOSE 160* 209* 193*  BUN 13 16 17   CREATININE 0.69 0.78 0.63  CALCIUM 9.2 9.1 8.9   Liver Function Tests: Recent Labs    06/29/17 1400 11/04/17 2028 12/14/17 0527  AST 32 18 19  ALT 47 20 17  ALKPHOS 97 86 98  BILITOT 1.2 1.3* 0.8  PROT 6.1* 6.1* 6.2*  ALBUMIN 3.3* 3.5 3.1*   No results for input(s): LIPASE, AMYLASE in the last 8760 hours. No results for input(s): AMMONIA in the last 8760 hours. CBC: Recent Labs    12/13/17 1431  12/30/17 0700 01/11/18 0555 01/17/18 0830 01/20/18 0730  WBC 19.5*   < >  13.1* 14.6* 16.2* 12.4*  NEUTROABS 7.4  --  4.8 4.8  --   --   HGB 13.6   < > 12.1* 13.2 13.1 12.4*  HCT 40.9   < > 36.4* 40.2 40.8 40.1  MCV 87.0   < > 89.7 89.3 89.3 87.6  PLT 301   < > 281 304 347 272   < > = values in this interval not displayed.   Cardiac Enzymes: Recent Labs    06/29/17 1400  TROPONINI 0.03*   BNP: Invalid input(s): POCBNP CBG: Recent Labs    12/17/17 2154 12/18/17 0739 12/18/17 1147  GLUCAP 186* 244* 248*    Procedures and Imaging Studies During Stay: Dg Op Swallowing  Func-medicare/speech Path  Result Date: 01/06/2018 [facility] [address] [place], [place], [address] Phone: [phone]   Fax:  [phone] Modified Barium Swallow Patient Details Name: PATON CRUM MRN: [id number] Date of Birth: Mar 17, 1935 No data recorded Encounter Date: 01/06/2018 Past Medical History: Diagnosis Date . Atrial fibrillation ([place])  . Chronic lymphocytic leukemia ([place])  . Coronary atherosclerosis of native coronary artery   Nonobstructive . Essential hypertension, benign  . Hyperlipidemia  . IgA nephropathy   Dr. Lowanda Foster . Type 2 diabetes mellitus ([place])  . UTI (urinary tract infection) july  10 th  taking  med  Past Surgical History: Procedure Laterality Date . AMPUTATION Left 09/15/2012  Procedure: PARTIAL AMPUTATION 3rd TOE LEFT FOOT;  Surgeon: Marcheta Grammes, DPM;  [facility];  Service: Orthopedics;  Laterality: Left; . AMPUTATION Left 04/20/2013  Procedure: PARTIAL AMPUTATION 2ND TOE LEFT FOOT;  Surgeon: Marcheta Grammes, DPM;  [facility];  Service: Orthopedics;  Laterality: Left; . ANAL FISSURE REPAIR   . APPLICATION OF WOUND VAC Right 0/04/7492  Procedure: APPLICATION OF WOUND VAC;  Surgeon: Caprice Beaver, DPM;  [facility];  Service: Podiatry;  Laterality: Right; . CATARACT EXTRACTION W/PHACO Right 09/30/2015  Procedure: CATARACT EXTRACTION PHACO AND INTRAOCULAR LENS PLACEMENT (IOC);  Surgeon: Tonny Branch, MD;  [facility];  Service: Ophthalmology;  Laterality: Right;  CDE: 11.78 . CATARACT EXTRACTION W/PHACO Left 10/31/2015  Procedure: CATARACT EXTRACTION PHACO AND INTRAOCULAR LENS PLACEMENT LEFT EYE; CDE:  9.10;  Surgeon: Tonny Branch, MD;  [facility];  Service: Ophthalmology;  Laterality: Left; . COLONOSCOPY  08/19/2011  Procedure: COLONOSCOPY;  Surgeon: Rogene Houston, MD;  [facility];  Service: Endoscopy;  Laterality: N/A;  930 . COLONOSCOPY N/A 09/19/2014  Procedure: COLONOSCOPY;  Surgeon:  Rogene Houston, MD;  [facility];  Service: Endoscopy;  Laterality: N/A;  1055 . FEMORAL HERNIA REPAIR   . INCISION AND DRAINAGE Right 09/09/2016  Procedure: DEBRIDEMENT WOUND RT heel with wound vac attachment;  Surgeon: Caprice Beaver, DPM;  [facility];  Service: Podiatry;  Laterality: Right; . INCISION AND DRAINAGE OF WOUND Right 08/12/2016  Procedure: DEBRIDEMENT WOUND RIGHT HEEL;  Surgeon: Caprice Beaver, DPM;  [facility];  Service: Podiatry;  Laterality: Right;  right heel . MASS EXCISION Right 09/28/2017  Procedure: EXCISION OF EXTERNAL AUDITORY CANAL MASS;  Surgeon: Leta Baptist, MD;  Location: [facility];  Service: ENT;  Laterality: Right; . Open repair left quadriceps tendon.  08/22/07  Dr. Aline Brochure . Right total knee replacement   . TRANSURETHRAL RESECTION OF PROSTATE   . TYMPANOMASTOIDECTOMY Right 11/10/2017  Procedure: RIGHT TYMPANOMASTOIDECTOMY;  Surgeon: Leta Baptist, MD;  Location: [facility];  Service: ENT;  Laterality: Right; .  WOUND EXPLORATION Right 08/12/2016  Procedure: EXPLORATION OF WOUND FOR FOREIGN BODY RIGHT HEEL;  Surgeon: Caprice Beaver, DPM;  [facility];  Service: Podiatry;  Laterality: Right;  right heel There were no vitals filed for this visit. Subjective Assessment - 01/06/18 1539   Subjective  "He has been coughing a lot with meals."   Special Tests  MBSS   Currently in Pain?  No/denies   General - 01/06/18 1540    General Information  Date of Onset  11/10/17   HPI  83 y.o. male with hx of DM2, UTI, IgA neph, HTN, afib , CLL and CAD direct admit from Dr Deeann Saint ENT office for mastoiditis/ OM w/ facial nerve palsy failing outpt abx. Subsequently had Tympanomastoidectomy on 11/10/17 after hospitalization for IV abx (8/1-8/6).  3 weeks after surgery, pt noted increasing pain and drainage from right ear.  Had office follow up 12/13/17-->sent to hospital for IV abx. He was then discharged to [facility] for continued antibiotics. Pt is referred  by Dr. Veleta Miners for MBSS due to increased coughing with meals. He was accompanied to the MBSS by his treating SLP.    Type of Study  MBS-Modified Barium Swallow Study   Previous Swallow Assessment  None on record   Diet Prior to this Study  Regular;Thin liquids  Pt refused ground  Temperature Spikes Noted  No   Respiratory Status  Room air   History of Recent Intubation  No   Behavior/Cognition  Alert;Cooperative;Pleasant mood   Oral Cavity Assessment  Excessive secretions   Oral Care Completed by SLP  No   Oral Cavity - Dentition  Adequate natural dentition   Vision  Functional for self feeding   Self-Feeding Abilities  Able to feed self   Baseline Vocal Quality  Normal   Volitional Cough  Strong   Volitional Swallow  Able to elicit   Anatomy  Within functional limits   Pharyngeal Secretions  Not observed secondary MBS   Oral Preparation/Oral Phase - 01/06/18 1541    Oral Preparation/Oral Phase  Oral Phase  Impaired    Oral - Thin  Oral - Thin Cup  Decreased bolus cohesion;Oral residue    Oral - Solids  Oral - Puree  Decreased bolus cohesion;Oral residue;Piecemeal swallowing   Oral - Regular  Decreased bolus cohesion;Oral residue;Piecemeal swallowing;Delayed A-P transit    Electrical stimulation - Oral Phase  Was Electrical Stimulation Used  No   Pharyngeal Phase - 01/06/18 1542    Pharyngeal Phase  Pharyngeal Phase  Impaired    Pharyngeal - Nectar  Pharyngeal- Nectar Cup  Swallow initiation at pyriform sinus;Delayed swallow initiation;Reduced epiglottic inversion;Reduced tongue base retraction;Penetration/Aspiration during swallow;Pharyngeal residue - valleculae;Pharyngeal residue - pyriform;Pharyngeal residue - cp segment   Pharyngeal  Material enters airway, remains ABOVE vocal cords and not ejected out;Material does not enter airway    Pharyngeal - Thin  Pharyngeal- Thin Teaspoon  Swallow initiation at vallecula   Pharyngeal- Thin Cup  Delayed swallow initiation;Swallow initiation at  vallecula;Penetration/Aspiration during swallow;Reduced tongue base retraction;Reduced epiglottic inversion;Pharyngeal residue - valleculae;Pharyngeal residue - pyriform;Pharyngeal residue - cp segment;Compensatory strategies attempted (with notebox)  chin tuck ineffective  Pharyngeal  Material does not enter airway;Material enters airway, remains ABOVE vocal cords and not ejected out;Material enters airway, CONTACTS cords and then ejected out    Pharyngeal - Solids  Pharyngeal- Puree  Delayed swallow initiation;Reduced epiglottic inversion;Reduced tongue base retraction;Reduced pharyngeal peristalsis;Pharyngeal residue - pyriform;Pharyngeal residue - valleculae;Pharyngeal residue - posterior pharnyx;Compensatory strategies attempted (with notebox)  effortful swallow and head turns ineffective  Pharyngeal- Regular  Delayed swallow initiation-vallecula;Reduced epiglottic inversion;Reduced anterior laryngeal mobility;Reduced tongue base retraction;Pharyngeal residue - valleculae;Pharyngeal residue - pyriform;Pharyngeal residue - posterior pharnyx   Pharyngeal- Pill  Within functional limits    Electrical Stimulation - Pharyngeal Phase  Was Electrical Stimulation Used  No   Cricopharyngeal Phase - 01/06/18 1555    Cervical Esophageal Phase  Cervical Esophageal Phase  Within functional limits   Plan - 01/06/18 1557   Clinical Impression Statement  Pt presents with moderate oropharyngeal phase dysphagia in setting of acute Bell's Palsy characterized by decreased bolus manipulation and cohesion, oral scatter/residue and piecemeal deglutition, delay in swallow initiation with swallow trigger after filling the valleculae, reduced tongue base retraction, epiglottic deflection, and posterior pharyngeal wall constriction resulting in piecemeal deglutition, penetration during the swallow with trace amount to the cords without aspiration and residuals in the valleculae, posterior pharyngeal wall, and pyriforms post swallow.  Although gross aspiration was not observed, Pt is at risk for aspiration due to severity of pharyngeal residuals across textures and consistencies. There was no perceivable benefit to NTL over thins, however regular texture residuals were greater in the valleculae than with puree textures. Recommend D3/mech soft with thin liquids via small cup sips, no straws, repeat swallows for each bite/sip, and clear throat periodically. Pt will need to be monitored with meals due to aspiration risk.   Patient will benefit from skilled therapeutic intervention in order to improve the following deficits and impairments:  Dysphagia, oropharyngeal phase Recommendations/Treatment - 01/06/18 1555    Swallow Evaluation Recommendations  SLP Diet Recommendations  Dysphagia 3 (mechanical soft);Thin   Liquid Administration via  Cup;No straw   Medication Administration  Whole meds with liquid   Supervision  Patient able to self feed;Full supervision/cueing for compensatory strategies   Compensations  Slow rate;Small sips/bites;Multiple dry swallows after each bite/sip   Postural Changes  Seated upright at 90 degrees;Remain upright for at least 30 minutes after feeds/meals   Prognosis - 01/06/18 1557    Prognosis  Prognosis for Safe Diet Advancement  Guarded   Barriers to Reach Goals  Severity of deficits    Individuals Consulted  Consulted and Agree with Results and Recommendations  Patient   Report Sent to   Referring physician;Primary SLP   Problem List Patient Active Problem List  Diagnosis Date Noted . Status post peripherally inserted central catheter (PICC) central line placement  . Hemorrhagic otitis externa of right ear 12/13/2017 . Hemorrhagic otitis externa 12/13/2017 . Uncontrolled type 2 diabetes mellitus with hyperglycemia, with long-term current use of insulin ([place]) 11/06/2017 . Acute mastoiditis 11/05/2017 . Atrial fibrillation, chronic 11/05/2017 . Chronic diastolic CHF (congestive heart failure) ([place]) 11/05/2017 .  Mastoiditis 11/04/2017 . Disorder of right facial nerve 11/04/2017 . Facial nerve palsy 11/04/2017 . Essential hypertension 09/10/2016 . Rt Heel Osteomyelitis ([place]) 09/08/2016 . Cellulitis of heel, right 09/08/2016 . Hypoglycemia 06/21/2016 . Type 2 diabetes mellitus ([place])  . Chronic lymphocytic leukemia ([place])  . Encounter for therapeutic drug monitoring 05/16/2013 . Cardiac murmur 10/21/2011 . Long term current use of anticoagulant therapy 06/27/2010 . Mixed hyperlipidemia 05/21/2008 . CORONARY ATHEROSCLEROSIS NATIVE CORONARY ARTERY 05/21/2008 . Atrial fibrillation ([place]) 05/21/2008 . KNEE PAIN 06/14/2007 Thank you, Genene Churn, [place] 01/06/2018, 4:09 PM [facility] [address] [place], [place], [address] Phone: [phone]   Fax:  [phone] Name: DAYLE MCNERNEY MRN: [id number] Date of Birth: Sep 01, 1934 CLINICAL DATA:  Dysphagia, Bell's palsy,  lot of mucus EXAM: MODIFIED BARIUM SWALLOW TECHNIQUE: Different consistencies of barium were administered orally to the patient by the Speech Pathologist. Imaging of the pharynx was performed in the lateral projection. FLUOROSCOPY TIME:  Fluoroscopy Time: 5 minutes 36 seconds Radiation Exposure Index (if provided by the fluoroscopic device): 38.8 mGy Number of Acquired Spot Images: multiple fluoroscopic screen captures COMPARISON:  None FINDINGS: Thin liquid-multiple swallows of thin liquid were assessed. Tiny amount by teaspoon showed no laryngeal penetration or aspiration. Multiple swallows by cup showed laryngeal penetration to the level of the vocal cords. No aspiration or spontaneous cough. Laryngeal penetration occurred with head turned to the LEFT and RIGHT, and with chin tuck. Vallecular and piriform sinus residuals, slightly greater on LEFT Nectar thick liquid-minimal laryngeal penetration without aspiration. Vallecular and piriform sinus residuals. Honey-not evaluated Pure-vallecular and  slightly less piriform sinus residuals. No laryngeal penetration or aspiration. Cracker-significant vallecular and piriform sinus residuals. No laryngeal penetration or aspiration. Pure with cracker-not evaluated Barium tablet-laryngeal penetration of the thin barium utilized to swallow the barium tablet. Tablet was swallowed without difficulty. No aspiration. Tablet passed to the stomach without esophageal obstruction. IMPRESSION: Swallowing dysfunction as above. Please refer to the Speech Pathologists report for complete details and recommendations. Electronically Signed   By: Lavonia Dana M.D.   On: 01/06/2018 14:18    Assessment/Plan:    Acute mastoiditis Patient finished 3 antibiotics for 6 weeks Ceftriaxone, Vanco and Flogyl till 10/20 Clinically doing very well. White Count still mildily Elevated. He is afebrile Otherwise and feels good. Following with [facility] Facial nerve palsy Continue to use Patch and Keeping it mosit  Uncontrolled type 2 diabetes mellitus On All his Meds including Insulin, Sliding Scale, Victoza, Actos Except Amaryl BS are doing well now. Continue same doses. Wife said they are comfortable using Sliding scale at home  Atrial fibrillation, chronic INR mildly elevated today Will hold Coumadin today and tomorrow Restart 4 mg on Wed Repeat INR in Coumadin clinic on Thurs Rate Controlled on Digoxin  Chronic diastolic CHF (congestive heart failure) Stable on Lasix and Lisinopril His dose of Lasix was increased due to SOB and LE edema. He is doing well now His weight is down almost 12 lbs and is now 246 lbs. Renal Function stable Also Was started on Potassium  Essential hypertension Stable on Lisinopril and Lasix  Oral Thrush Better on Nystatin Continue oral Care Dysphagia D/w Speech and he does have dysphagia not related to Bells Palsy He is on Modified Diet. Wife is aware  Patient did not have any need for Home health Wife is primary  caregiver. No DME needed Future labs/tests needed:    Discharge More then 35 min

## 2018-01-25 ENCOUNTER — Ambulatory Visit: Payer: Medicare Other | Admitting: Cardiology

## 2018-01-26 LAB — VITAMIN B6: VITAMIN B6: 1.3 ug/L — AB (ref 5.3–46.7)

## 2018-01-27 ENCOUNTER — Ambulatory Visit: Payer: Self-pay | Admitting: *Deleted

## 2018-01-27 ENCOUNTER — Ambulatory Visit (INDEPENDENT_AMBULATORY_CARE_PROVIDER_SITE_OTHER): Payer: Medicare Other | Admitting: *Deleted

## 2018-01-27 DIAGNOSIS — Z5181 Encounter for therapeutic drug level monitoring: Secondary | ICD-10-CM

## 2018-01-27 DIAGNOSIS — I4891 Unspecified atrial fibrillation: Secondary | ICD-10-CM

## 2018-01-27 DIAGNOSIS — Z7901 Long term (current) use of anticoagulants: Secondary | ICD-10-CM

## 2018-01-27 LAB — POCT INR
INR: 2.2 (ref 2.0–3.0)
INR: 2.4 (ref 2.0–3.0)
INR: 2.4 (ref 2.0–3.0)
INR: 2.4 (ref 2.0–3.0)

## 2018-01-27 NOTE — Patient Instructions (Signed)
D/C from [facility] 10/21  Start coumadin 5mg  daily  Recheck in 1 week

## 2018-01-31 ENCOUNTER — Ambulatory Visit (INDEPENDENT_AMBULATORY_CARE_PROVIDER_SITE_OTHER): Payer: Medicare Other | Admitting: Otolaryngology

## 2018-01-31 DIAGNOSIS — Z6835 Body mass index (BMI) 35.0-35.9, adult: Secondary | ICD-10-CM | POA: Diagnosis not present

## 2018-01-31 DIAGNOSIS — I4891 Unspecified atrial fibrillation: Secondary | ICD-10-CM | POA: Diagnosis not present

## 2018-01-31 DIAGNOSIS — E1165 Type 2 diabetes mellitus with hyperglycemia: Secondary | ICD-10-CM | POA: Diagnosis not present

## 2018-01-31 DIAGNOSIS — R531 Weakness: Secondary | ICD-10-CM | POA: Diagnosis not present

## 2018-01-31 DIAGNOSIS — Z299 Encounter for prophylactic measures, unspecified: Secondary | ICD-10-CM | POA: Diagnosis not present

## 2018-01-31 DIAGNOSIS — I10 Essential (primary) hypertension: Secondary | ICD-10-CM | POA: Diagnosis not present

## 2018-02-02 DIAGNOSIS — I4891 Unspecified atrial fibrillation: Secondary | ICD-10-CM | POA: Diagnosis not present

## 2018-02-02 DIAGNOSIS — E119 Type 2 diabetes mellitus without complications: Secondary | ICD-10-CM | POA: Diagnosis not present

## 2018-02-02 DIAGNOSIS — I10 Essential (primary) hypertension: Secondary | ICD-10-CM | POA: Diagnosis not present

## 2018-02-03 ENCOUNTER — Ambulatory Visit (INDEPENDENT_AMBULATORY_CARE_PROVIDER_SITE_OTHER): Payer: Medicare Other | Admitting: *Deleted

## 2018-02-03 DIAGNOSIS — Z7901 Long term (current) use of anticoagulants: Secondary | ICD-10-CM | POA: Diagnosis not present

## 2018-02-03 DIAGNOSIS — Z5181 Encounter for therapeutic drug level monitoring: Secondary | ICD-10-CM

## 2018-02-03 DIAGNOSIS — I4891 Unspecified atrial fibrillation: Secondary | ICD-10-CM | POA: Diagnosis not present

## 2018-02-03 LAB — POCT INR: INR: 2.3 (ref 2.0–3.0)

## 2018-02-03 MED ORDER — WARFARIN SODIUM 5 MG PO TABS: ORAL_TABLET | ORAL | 3 refills | Status: DC

## 2018-02-03 NOTE — Patient Instructions (Signed)
D/C from [facility] 10/21  Continue coumadin 5mg  daily  Recheck in 2 week

## 2018-02-04 DIAGNOSIS — E1142 Type 2 diabetes mellitus with diabetic polyneuropathy: Secondary | ICD-10-CM | POA: Diagnosis not present

## 2018-02-04 DIAGNOSIS — B351 Tinea unguium: Secondary | ICD-10-CM | POA: Diagnosis not present

## 2018-02-07 ENCOUNTER — Ambulatory Visit (INDEPENDENT_AMBULATORY_CARE_PROVIDER_SITE_OTHER): Payer: Medicare Other | Admitting: Otolaryngology

## 2018-02-07 DIAGNOSIS — N189 Chronic kidney disease, unspecified: Secondary | ICD-10-CM | POA: Diagnosis not present

## 2018-02-07 DIAGNOSIS — E1165 Type 2 diabetes mellitus with hyperglycemia: Secondary | ICD-10-CM | POA: Diagnosis not present

## 2018-02-07 DIAGNOSIS — H70009 Acute mastoiditis without complications, unspecified ear: Secondary | ICD-10-CM | POA: Diagnosis not present

## 2018-02-07 DIAGNOSIS — I2510 Atherosclerotic heart disease of native coronary artery without angina pectoris: Secondary | ICD-10-CM | POA: Diagnosis not present

## 2018-02-07 DIAGNOSIS — I129 Hypertensive chronic kidney disease with stage 1 through stage 4 chronic kidney disease, or unspecified chronic kidney disease: Secondary | ICD-10-CM | POA: Diagnosis not present

## 2018-02-07 DIAGNOSIS — I4891 Unspecified atrial fibrillation: Secondary | ICD-10-CM | POA: Diagnosis not present

## 2018-02-07 DIAGNOSIS — F419 Anxiety disorder, unspecified: Secondary | ICD-10-CM | POA: Diagnosis not present

## 2018-02-07 DIAGNOSIS — R531 Weakness: Secondary | ICD-10-CM | POA: Diagnosis not present

## 2018-02-07 DIAGNOSIS — Z794 Long term (current) use of insulin: Secondary | ICD-10-CM | POA: Diagnosis not present

## 2018-02-07 DIAGNOSIS — G510 Bell's palsy: Secondary | ICD-10-CM | POA: Diagnosis not present

## 2018-02-07 DIAGNOSIS — M545 Low back pain: Secondary | ICD-10-CM | POA: Diagnosis not present

## 2018-02-07 DIAGNOSIS — E7800 Pure hypercholesterolemia, unspecified: Secondary | ICD-10-CM | POA: Diagnosis not present

## 2018-02-07 DIAGNOSIS — C9191 Lymphoid leukemia, unspecified, in remission: Secondary | ICD-10-CM | POA: Diagnosis not present

## 2018-02-07 DIAGNOSIS — E1122 Type 2 diabetes mellitus with diabetic chronic kidney disease: Secondary | ICD-10-CM | POA: Diagnosis not present

## 2018-02-09 DIAGNOSIS — R531 Weakness: Secondary | ICD-10-CM | POA: Diagnosis not present

## 2018-02-09 DIAGNOSIS — G510 Bell's palsy: Secondary | ICD-10-CM | POA: Diagnosis not present

## 2018-02-09 DIAGNOSIS — I129 Hypertensive chronic kidney disease with stage 1 through stage 4 chronic kidney disease, or unspecified chronic kidney disease: Secondary | ICD-10-CM | POA: Diagnosis not present

## 2018-02-09 DIAGNOSIS — E1165 Type 2 diabetes mellitus with hyperglycemia: Secondary | ICD-10-CM | POA: Diagnosis not present

## 2018-02-09 DIAGNOSIS — H70009 Acute mastoiditis without complications, unspecified ear: Secondary | ICD-10-CM | POA: Diagnosis not present

## 2018-02-09 DIAGNOSIS — I2510 Atherosclerotic heart disease of native coronary artery without angina pectoris: Secondary | ICD-10-CM | POA: Diagnosis not present

## 2018-02-14 ENCOUNTER — Other Ambulatory Visit (HOSPITAL_COMMUNITY): Payer: Self-pay | Admitting: Ophthalmology

## 2018-02-14 DIAGNOSIS — H16212 Exposure keratoconjunctivitis, left eye: Secondary | ICD-10-CM | POA: Diagnosis not present

## 2018-02-14 DIAGNOSIS — H02142 Spastic ectropion of right lower eyelid: Secondary | ICD-10-CM | POA: Diagnosis not present

## 2018-02-14 DIAGNOSIS — H04123 Dry eye syndrome of bilateral lacrimal glands: Secondary | ICD-10-CM | POA: Diagnosis not present

## 2018-02-14 DIAGNOSIS — H4921 Sixth [abducent] nerve palsy, right eye: Secondary | ICD-10-CM | POA: Diagnosis not present

## 2018-02-14 DIAGNOSIS — H0234 Blepharochalasis left upper eyelid: Secondary | ICD-10-CM | POA: Diagnosis not present

## 2018-02-14 DIAGNOSIS — H0231 Blepharochalasis right upper eyelid: Secondary | ICD-10-CM | POA: Diagnosis not present

## 2018-02-14 DIAGNOSIS — H532 Diplopia: Secondary | ICD-10-CM | POA: Diagnosis not present

## 2018-02-14 DIAGNOSIS — G514 Facial myokymia: Secondary | ICD-10-CM | POA: Diagnosis not present

## 2018-02-14 DIAGNOSIS — H16211 Exposure keratoconjunctivitis, right eye: Secondary | ICD-10-CM | POA: Diagnosis not present

## 2018-02-14 DIAGNOSIS — H0235 Blepharochalasis left lower eyelid: Secondary | ICD-10-CM | POA: Diagnosis not present

## 2018-02-14 DIAGNOSIS — H02112 Cicatricial ectropion of right lower eyelid: Secondary | ICD-10-CM | POA: Diagnosis not present

## 2018-02-14 DIAGNOSIS — H02132 Senile ectropion of right lower eyelid: Secondary | ICD-10-CM | POA: Diagnosis not present

## 2018-02-14 DIAGNOSIS — H0232 Blepharochalasis right lower eyelid: Secondary | ICD-10-CM | POA: Diagnosis not present

## 2018-02-15 ENCOUNTER — Ambulatory Visit (HOSPITAL_COMMUNITY)
Admission: RE | Admit: 2018-02-15 | Discharge: 2018-02-15 | Disposition: A | Discharge status: Home / Self Care | Payer: Medicare Other | Source: Ambulatory Visit | Attending: Ophthalmology | Admitting: Ophthalmology

## 2018-02-15 ENCOUNTER — Other Ambulatory Visit: Payer: Self-pay

## 2018-02-15 ENCOUNTER — Other Ambulatory Visit (HOSPITAL_COMMUNITY): Payer: Self-pay | Admitting: Ophthalmology

## 2018-02-15 ENCOUNTER — Inpatient Hospital Stay (HOSPITAL_COMMUNITY)
Admission: EM | Admit: 2018-02-15 | Discharge: 2018-02-21 | DRG: 131 | Disposition: A | Discharge status: Skilled Nursing Facility | Payer: Medicare Other | Attending: Internal Medicine | Admitting: Internal Medicine

## 2018-02-15 ENCOUNTER — Encounter (HOSPITAL_COMMUNITY): Payer: Self-pay | Admitting: Emergency Medicine

## 2018-02-15 DIAGNOSIS — Z978 Presence of other specified devices: Secondary | ICD-10-CM | POA: Diagnosis not present

## 2018-02-15 DIAGNOSIS — Z96651 Presence of right artificial knee joint: Secondary | ICD-10-CM | POA: Diagnosis not present

## 2018-02-15 DIAGNOSIS — Z794 Long term (current) use of insulin: Secondary | ICD-10-CM

## 2018-02-15 DIAGNOSIS — C9110 Chronic lymphocytic leukemia of B-cell type not having achieved remission: Secondary | ICD-10-CM | POA: Diagnosis not present

## 2018-02-15 DIAGNOSIS — K5900 Constipation, unspecified: Secondary | ICD-10-CM | POA: Diagnosis present

## 2018-02-15 DIAGNOSIS — N289 Disorder of kidney and ureter, unspecified: Secondary | ICD-10-CM | POA: Diagnosis not present

## 2018-02-15 DIAGNOSIS — Z8669 Personal history of other diseases of the nervous system and sense organs: Secondary | ICD-10-CM | POA: Diagnosis not present

## 2018-02-15 DIAGNOSIS — Z791 Long term (current) use of non-steroidal anti-inflammatories (NSAID): Secondary | ICD-10-CM

## 2018-02-15 DIAGNOSIS — G510 Bell's palsy: Secondary | ICD-10-CM | POA: Diagnosis not present

## 2018-02-15 DIAGNOSIS — Z89422 Acquired absence of other left toe(s): Secondary | ICD-10-CM

## 2018-02-15 DIAGNOSIS — G08 Intracranial and intraspinal phlebitis and thrombophlebitis: Secondary | ICD-10-CM

## 2018-02-15 DIAGNOSIS — I110 Hypertensive heart disease with heart failure: Secondary | ICD-10-CM | POA: Diagnosis present

## 2018-02-15 DIAGNOSIS — R0902 Hypoxemia: Secondary | ICD-10-CM | POA: Diagnosis present

## 2018-02-15 DIAGNOSIS — I10 Essential (primary) hypertension: Secondary | ICD-10-CM | POA: Diagnosis present

## 2018-02-15 DIAGNOSIS — M869 Osteomyelitis, unspecified: Secondary | ICD-10-CM | POA: Diagnosis not present

## 2018-02-15 DIAGNOSIS — B965 Pseudomonas (aeruginosa) (mallei) (pseudomallei) as the cause of diseases classified elsewhere: Secondary | ICD-10-CM | POA: Diagnosis not present

## 2018-02-15 DIAGNOSIS — R279 Unspecified lack of coordination: Secondary | ICD-10-CM | POA: Diagnosis not present

## 2018-02-15 DIAGNOSIS — N400 Enlarged prostate without lower urinary tract symptoms: Secondary | ICD-10-CM

## 2018-02-15 DIAGNOSIS — Z856 Personal history of leukemia: Secondary | ICD-10-CM | POA: Diagnosis not present

## 2018-02-15 DIAGNOSIS — Z7401 Bed confinement status: Secondary | ICD-10-CM | POA: Diagnosis not present

## 2018-02-15 DIAGNOSIS — M868X8 Other osteomyelitis, other site: Secondary | ICD-10-CM | POA: Diagnosis not present

## 2018-02-15 DIAGNOSIS — M8618 Other acute osteomyelitis, other site: Secondary | ICD-10-CM | POA: Diagnosis not present

## 2018-02-15 DIAGNOSIS — H532 Diplopia: Secondary | ICD-10-CM

## 2018-02-15 DIAGNOSIS — R5381 Other malaise: Secondary | ICD-10-CM

## 2018-02-15 DIAGNOSIS — H7011 Chronic mastoiditis, right ear: Secondary | ICD-10-CM | POA: Diagnosis present

## 2018-02-15 DIAGNOSIS — G062 Extradural and subdural abscess, unspecified: Secondary | ICD-10-CM

## 2018-02-15 DIAGNOSIS — I2510 Atherosclerotic heart disease of native coronary artery without angina pectoris: Secondary | ICD-10-CM | POA: Diagnosis present

## 2018-02-15 DIAGNOSIS — H4910 Fourth [trochlear] nerve palsy, unspecified eye: Secondary | ICD-10-CM | POA: Diagnosis not present

## 2018-02-15 DIAGNOSIS — H4921 Sixth [abducent] nerve palsy, right eye: Secondary | ICD-10-CM | POA: Diagnosis present

## 2018-02-15 DIAGNOSIS — E1169 Type 2 diabetes mellitus with other specified complication: Secondary | ICD-10-CM | POA: Diagnosis present

## 2018-02-15 DIAGNOSIS — Z452 Encounter for adjustment and management of vascular access device: Secondary | ICD-10-CM | POA: Diagnosis not present

## 2018-02-15 DIAGNOSIS — F29 Unspecified psychosis not due to a substance or known physiological condition: Secondary | ICD-10-CM | POA: Diagnosis not present

## 2018-02-15 DIAGNOSIS — Z87891 Personal history of nicotine dependence: Secondary | ICD-10-CM

## 2018-02-15 DIAGNOSIS — Z8249 Family history of ischemic heart disease and other diseases of the circulatory system: Secondary | ICD-10-CM

## 2018-02-15 DIAGNOSIS — E119 Type 2 diabetes mellitus without complications: Secondary | ICD-10-CM | POA: Diagnosis not present

## 2018-02-15 DIAGNOSIS — H70091 Acute mastoiditis with other complications, right ear: Secondary | ICD-10-CM | POA: Diagnosis not present

## 2018-02-15 DIAGNOSIS — I482 Chronic atrial fibrillation, unspecified: Secondary | ICD-10-CM | POA: Diagnosis present

## 2018-02-15 DIAGNOSIS — Z880 Allergy status to penicillin: Secondary | ICD-10-CM | POA: Diagnosis not present

## 2018-02-15 DIAGNOSIS — D539 Nutritional anemia, unspecified: Secondary | ICD-10-CM | POA: Diagnosis not present

## 2018-02-15 DIAGNOSIS — G8929 Other chronic pain: Secondary | ICD-10-CM | POA: Diagnosis not present

## 2018-02-15 DIAGNOSIS — R2689 Other abnormalities of gait and mobility: Secondary | ICD-10-CM | POA: Diagnosis not present

## 2018-02-15 DIAGNOSIS — F411 Generalized anxiety disorder: Secondary | ICD-10-CM | POA: Diagnosis not present

## 2018-02-15 DIAGNOSIS — N028 Recurrent and persistent hematuria with other morphologic changes: Secondary | ICD-10-CM | POA: Diagnosis not present

## 2018-02-15 DIAGNOSIS — Z7901 Long term (current) use of anticoagulants: Secondary | ICD-10-CM | POA: Diagnosis not present

## 2018-02-15 DIAGNOSIS — R262 Difficulty in walking, not elsewhere classified: Secondary | ICD-10-CM | POA: Diagnosis not present

## 2018-02-15 DIAGNOSIS — G518 Other disorders of facial nerve: Secondary | ICD-10-CM | POA: Diagnosis not present

## 2018-02-15 DIAGNOSIS — Z95828 Presence of other vascular implants and grafts: Secondary | ICD-10-CM

## 2018-02-15 DIAGNOSIS — Z79899 Other long term (current) drug therapy: Secondary | ICD-10-CM

## 2018-02-15 DIAGNOSIS — D649 Anemia, unspecified: Secondary | ICD-10-CM

## 2018-02-15 DIAGNOSIS — Z881 Allergy status to other antibiotic agents status: Secondary | ICD-10-CM | POA: Diagnosis not present

## 2018-02-15 DIAGNOSIS — M2550 Pain in unspecified joint: Secondary | ICD-10-CM | POA: Diagnosis not present

## 2018-02-15 DIAGNOSIS — I69822 Dysarthria following other cerebrovascular disease: Secondary | ICD-10-CM | POA: Diagnosis not present

## 2018-02-15 DIAGNOSIS — F419 Anxiety disorder, unspecified: Secondary | ICD-10-CM | POA: Diagnosis not present

## 2018-02-15 DIAGNOSIS — M6281 Muscle weakness (generalized): Secondary | ICD-10-CM | POA: Diagnosis not present

## 2018-02-15 DIAGNOSIS — Z77018 Contact with and (suspected) exposure to other hazardous metals: Secondary | ICD-10-CM | POA: Diagnosis not present

## 2018-02-15 DIAGNOSIS — Z48810 Encounter for surgical aftercare following surgery on the sense organs: Secondary | ICD-10-CM | POA: Diagnosis not present

## 2018-02-15 DIAGNOSIS — I5032 Chronic diastolic (congestive) heart failure: Secondary | ICD-10-CM | POA: Diagnosis present

## 2018-02-15 DIAGNOSIS — E785 Hyperlipidemia, unspecified: Secondary | ICD-10-CM

## 2018-02-15 DIAGNOSIS — I4819 Other persistent atrial fibrillation: Secondary | ICD-10-CM | POA: Diagnosis not present

## 2018-02-15 DIAGNOSIS — H70001 Acute mastoiditis without complications, right ear: Secondary | ICD-10-CM | POA: Diagnosis not present

## 2018-02-15 DIAGNOSIS — R0602 Shortness of breath: Secondary | ICD-10-CM | POA: Diagnosis not present

## 2018-02-15 DIAGNOSIS — Z9889 Other specified postprocedural states: Secondary | ICD-10-CM | POA: Diagnosis not present

## 2018-02-15 DIAGNOSIS — H6021 Malignant otitis externa, right ear: Principal | ICD-10-CM | POA: Diagnosis present

## 2018-02-15 DIAGNOSIS — E1165 Type 2 diabetes mellitus with hyperglycemia: Secondary | ICD-10-CM | POA: Diagnosis not present

## 2018-02-15 DIAGNOSIS — R5383 Other fatigue: Secondary | ICD-10-CM | POA: Diagnosis not present

## 2018-02-15 DIAGNOSIS — Z888 Allergy status to other drugs, medicaments and biological substances status: Secondary | ICD-10-CM

## 2018-02-15 LAB — CBC WITH DIFFERENTIAL/PLATELET
ABS IMMATURE GRANULOCYTES: 0.08 10*3/uL — AB (ref 0.00–0.07)
BASOS ABS: 0.1 10*3/uL (ref 0.0–0.1)
Basophils Relative: 0 %
Eosinophils Absolute: 0.3 10*3/uL (ref 0.0–0.5)
Eosinophils Relative: 1 %
HCT: 40.1 % (ref 39.0–52.0)
Hemoglobin: 12.7 g/dL — ABNORMAL LOW (ref 13.0–17.0)
Immature Granulocytes: 0 %
LYMPHS ABS: 10.2 10*3/uL — AB (ref 0.7–4.0)
LYMPHS PCT: 51 %
MCH: 27.5 pg (ref 26.0–34.0)
MCHC: 31.7 g/dL (ref 30.0–36.0)
MCV: 86.8 fL (ref 80.0–100.0)
Monocytes Absolute: 1.5 10*3/uL — ABNORMAL HIGH (ref 0.1–1.0)
Monocytes Relative: 7 %
NEUTROS PCT: 41 %
NRBC: 0 % (ref 0.0–0.2)
Neutro Abs: 8.4 10*3/uL — ABNORMAL HIGH (ref 1.7–7.7)
PLATELETS: 358 10*3/uL (ref 150–400)
RBC: 4.62 MIL/uL (ref 4.22–5.81)
RDW: 14.8 % (ref 11.5–15.5)
WBC: 20.5 10*3/uL — ABNORMAL HIGH (ref 4.0–10.5)

## 2018-02-15 LAB — BASIC METABOLIC PANEL
Anion gap: 9 (ref 5–15)
BUN: 28 mg/dL — AB (ref 8–23)
CO2: 27 mmol/L (ref 22–32)
CREATININE: 0.78 mg/dL (ref 0.61–1.24)
Calcium: 10.5 mg/dL — ABNORMAL HIGH (ref 8.9–10.3)
Chloride: 98 mmol/L (ref 98–111)
GFR calc non Af Amer: >60 mL/min (ref >60)
Glucose, Bld: 79 mg/dL (ref 70–99)
Potassium: 4.3 mmol/L (ref 3.5–5.1)
Sodium: 134 mmol/L — ABNORMAL LOW (ref 135–145)

## 2018-02-15 LAB — BRAIN NATRIURETIC PEPTIDE: B NATRIURETIC PEPTIDE 5: 83 pg/mL (ref 0.0–100.0)

## 2018-02-15 LAB — PROTIME-INR
INR: 4.03 — AB
Prothrombin Time: 38.6 seconds — ABNORMAL HIGH (ref 11.4–15.2)

## 2018-02-15 LAB — GLUCOSE, CAPILLARY: Glucose-Capillary: 86 mg/dL (ref 70–99)

## 2018-02-15 LAB — CBG MONITORING, ED: GLUCOSE-CAPILLARY: 85 mg/dL (ref 70–99)

## 2018-02-15 MED ORDER — INSULIN ASPART 100 UNIT/ML ~~LOC~~ SOLN
0.0000 [IU] | Freq: Three times a day (TID) | SUBCUTANEOUS | Status: DC
  Administered 2018-02-16 (×2): 1 [IU] via SUBCUTANEOUS
  Administered 2018-02-17 (×2): 3 [IU] via SUBCUTANEOUS
  Administered 2018-02-17: 2 [IU] via SUBCUTANEOUS
  Administered 2018-02-18: 3 [IU] via SUBCUTANEOUS
  Administered 2018-02-18: 5 [IU] via SUBCUTANEOUS
  Administered 2018-02-18 (×2): 3 [IU] via SUBCUTANEOUS
  Administered 2018-02-19: 2 [IU] via SUBCUTANEOUS
  Administered 2018-02-19: 1 [IU] via SUBCUTANEOUS
  Administered 2018-02-20: 2 [IU] via SUBCUTANEOUS
  Administered 2018-02-20 – 2018-02-21 (×3): 3 [IU] via SUBCUTANEOUS
  Administered 2018-02-21: 2 [IU] via SUBCUTANEOUS

## 2018-02-15 MED ORDER — VITAMIN D 25 MCG (1000 UNIT) PO TABS
1000.0000 [IU] | ORAL_TABLET | Freq: Every morning | ORAL | Status: DC
  Administered 2018-02-17 – 2018-02-21 (×5): 1000 [IU] via ORAL
  Filled 2018-02-15 (×7): qty 1

## 2018-02-15 MED ORDER — ADULT MULTIVITAMIN W/MINERALS CH
1.0000 | ORAL_TABLET | Freq: Every day | ORAL | Status: DC
  Administered 2018-02-17 – 2018-02-21 (×5): 1 via ORAL
  Filled 2018-02-15 (×6): qty 1

## 2018-02-15 MED ORDER — GADOBUTROL 1 MMOL/ML IV SOLN
10.0000 mL | Freq: Once | INTRAVENOUS | Status: AC | PRN
  Administered 2018-02-15: 10 mL via INTRAVENOUS

## 2018-02-15 MED ORDER — OMEGA-3-ACID ETHYL ESTERS 1 G PO CAPS
1.0000 | ORAL_CAPSULE | Freq: Two times a day (BID) | ORAL | Status: DC
  Administered 2018-02-15 – 2018-02-21 (×11): 1 g via ORAL
  Filled 2018-02-15 (×12): qty 1

## 2018-02-15 MED ORDER — TRAMADOL HCL 50 MG PO TABS
50.0000 mg | ORAL_TABLET | Freq: Four times a day (QID) | ORAL | Status: DC | PRN
  Administered 2018-02-17: 50 mg via ORAL
  Filled 2018-02-15: qty 1

## 2018-02-15 MED ORDER — FINASTERIDE 5 MG PO TABS
5.0000 mg | ORAL_TABLET | Freq: Every morning | ORAL | Status: DC
  Administered 2018-02-16 – 2018-02-21 (×6): 5 mg via ORAL
  Filled 2018-02-15 (×7): qty 1

## 2018-02-15 MED ORDER — CO Q-10 100 MG PO CAPS: 100.0000 mg | ORAL_CAPSULE | Freq: Every day | ORAL | Status: DC

## 2018-02-15 MED ORDER — ATORVASTATIN CALCIUM 10 MG PO TABS
10.0000 mg | ORAL_TABLET | Freq: Every evening | ORAL | Status: DC
  Administered 2018-02-15 – 2018-02-20 (×6): 10 mg via ORAL
  Filled 2018-02-15 (×6): qty 1

## 2018-02-15 MED ORDER — VANCOMYCIN HCL 10 G IV SOLR
1250.0000 mg | Freq: Two times a day (BID) | INTRAVENOUS | Status: DC
  Administered 2018-02-16 – 2018-02-17 (×3): 1250 mg via INTRAVENOUS
  Filled 2018-02-15 (×3): qty 1250

## 2018-02-15 MED ORDER — LORAZEPAM 1 MG PO TABS
1.0000 mg | ORAL_TABLET | Freq: Every day | ORAL | Status: DC
  Administered 2018-02-17 – 2018-02-20 (×4): 1 mg via ORAL
  Filled 2018-02-15 (×4): qty 1

## 2018-02-15 MED ORDER — HYDRALAZINE HCL 20 MG/ML IJ SOLN: 5.0000 mg | INTRAMUSCULAR | Status: DC | PRN

## 2018-02-15 MED ORDER — INSULIN GLARGINE 100 UNIT/ML ~~LOC~~ SOLN
10.0000 [IU] | Freq: Every day | SUBCUTANEOUS | Status: DC
  Filled 2018-02-15: qty 0.1

## 2018-02-15 MED ORDER — CLOTRIMAZOLE 1 % EX CREA
1.0000 "application " | TOPICAL_CREAM | Freq: Every morning | CUTANEOUS | Status: DC
  Administered 2018-02-18 – 2018-02-20 (×3): 1 via TOPICAL
  Filled 2018-02-15: qty 15

## 2018-02-15 MED ORDER — SODIUM CHLORIDE 0.9 % IV SOLN
2.0000 g | Freq: Three times a day (TID) | INTRAVENOUS | Status: DC
  Administered 2018-02-16 – 2018-02-17 (×4): 2 g via INTRAVENOUS
  Filled 2018-02-15 (×5): qty 2

## 2018-02-15 MED ORDER — DIGOXIN 125 MCG PO TABS
0.1250 mg | ORAL_TABLET | Freq: Every day | ORAL | Status: DC
  Administered 2018-02-16 – 2018-02-21 (×6): 0.125 mg via ORAL
  Filled 2018-02-15 (×7): qty 1

## 2018-02-15 MED ORDER — VITAMIN C 500 MG PO TABS
1000.0000 mg | ORAL_TABLET | Freq: Every day | ORAL | Status: DC
  Administered 2018-02-17 – 2018-02-21 (×5): 1000 mg via ORAL
  Filled 2018-02-15 (×6): qty 2

## 2018-02-15 MED ORDER — VANCOMYCIN HCL 10 G IV SOLR
2000.0000 mg | Freq: Once | INTRAVENOUS | Status: AC
  Administered 2018-02-16: 2000 mg via INTRAVENOUS
  Filled 2018-02-15: qty 2000

## 2018-02-15 MED ORDER — VITAMIN E 180 MG (400 UNIT) PO CAPS
400.0000 [IU] | ORAL_CAPSULE | Freq: Every morning | ORAL | Status: DC
  Administered 2018-02-17 – 2018-02-21 (×5): 400 [IU] via ORAL
  Filled 2018-02-15 (×6): qty 1

## 2018-02-15 MED ORDER — SODIUM CHLORIDE 0.9 % IV SOLN
2.0000 g | Freq: Once | INTRAVENOUS | Status: AC
  Administered 2018-02-16: 2 g via INTRAVENOUS
  Filled 2018-02-15: qty 2

## 2018-02-15 MED ORDER — CALCIUM CARBONATE-VITAMIN D 500-200 MG-UNIT PO TABS
2.0000 | ORAL_TABLET | Freq: Every day | ORAL | Status: DC
  Administered 2018-02-17 – 2018-02-21 (×5): 2 via ORAL
  Filled 2018-02-15 (×6): qty 2

## 2018-02-15 NOTE — Progress Notes (Signed)
Pharmacy Antibiotic Note  [name] [name] is a 83 y.o. male admitted on 02/15/2018 with osteomyelitis.  Pharmacy has been consulted for vancomycin and cefepime dosing. He was previously on antibiotics for osteomyelitis. Vancomycin dose was 1250 mg IV q12 hours  Plan: Vancomycin 1250 IV every 12 hours.  Goal trough 15-20 mcg/mL.  Cefepime 2gm IV q8 hours F/u renal function, cultures and clinical course  Height: 6' (182.9 cm) Weight: 243 lb (110.2 kg) IBW/kg (Calculated) : 77.6  Temp (24hrs), Avg:98.1 F (36.7 C), Min:97.4 F (36.3 C), Max:98.8 F (37.1 C)  Recent Labs  Lab 02/15/18 1449  WBC 20.5*  CREATININE 0.78    Estimated Creatinine Clearance: 92.8 mL/min (by C-G formula based on SCr of 0.78 mg/dL).    Allergies  Allergen Reactions  . Erythromycin Other (See Comments)  . Penicillins Hives and Other (See Comments)    Has patient had a PCN reaction causing immediate rash, facial/tongue/throat swelling, SOB or lightheadedness with hypotension: No Has patient had a PCN reaction causing severe rash involving mucus membranes or skin necrosis: No Has patient had a PCN reaction that required hospitalization No Has patient had a PCN reaction occurring within the last 10 years: No If all of the above answers are "NO", then may proceed with Cephalosporin use.  Has tolerated Rocephin     Thank you for allowing pharmacy to be a part of this patient's care.  Excell Seltzer Poteet 02/15/2018 10:59 PM

## 2018-02-15 NOTE — ED Provider Notes (Addendum)
[facility] EMERGENCY DEPARTMENT Provider Note   CSN: [id number] Arrival date & time: 02/15/18  1354     History   Chief Complaint Chief Complaint  Patient presents with  . Abnormal Lab    HPI [name] [name] is a 83 y.o. male.  HPI Patient presents with his wife who provides much of the HPI. Patient has poor hearing, and has ongoing illness in his right ear, only wears one hearing aid, and the left ear, which is not currently present. He does, however, corroborate details of the HPI, and answers brief questions appropriately. Patient has a notable history of recurrent mastoiditis, prior partial excision, ongoing topical antibiotic therapy. Patient was released from nursing facility 3 weeks ago, and wife notes at home the patient has been doing generally better until the past week, when he has had more fatigue than usual, with new difficulty seeing from the right eye. Due to these concerns the patient was seen yesterday, had MRI performed this morning, and will after being informed of abnormal results is here for evaluation. They deny any fever, vomiting, falling, confusion, syncope. No ongoing medication use beyond topical antibiotic applied daily to the right ear canal. The patient himself denies pain, confusion, lightheadedness. Past Medical History:  Diagnosis Date  . Atrial fibrillation ([place])   . Chronic lymphocytic leukemia ([place])   . Coronary atherosclerosis of native coronary artery    Nonobstructive  . Essential hypertension, benign   . Hyperlipidemia   . IgA nephropathy    Dr. Lowanda Foster  . Type 2 diabetes mellitus ([place])   . UTI (urinary tract infection) july  10 th   taking  med     Patient Active Problem List   Diagnosis Date Noted  . Status post peripherally inserted central catheter (PICC) central line placement   . Hemorrhagic otitis externa of right ear 12/13/2017  . Hemorrhagic otitis externa 12/13/2017  . Uncontrolled type 2 diabetes mellitus with  hyperglycemia, with long-term current use of insulin ([place]) 11/06/2017  . Acute mastoiditis 11/05/2017  . Atrial fibrillation, chronic 11/05/2017  . Chronic diastolic CHF (congestive heart failure) ([place]) 11/05/2017  . Mastoiditis 11/04/2017  . Disorder of right facial nerve 11/04/2017  . Facial nerve palsy 11/04/2017  . Essential hypertension 09/10/2016  . 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 Cellulitis of heel, right 09/08/2016  . Hypoglycemia 06/21/2016  . Type 2 diabetes mellitus ([place])   . Chronic lymphocytic leukemia ([place])   . Encounter for therapeutic drug monitoring 05/16/2013  . Cardiac murmur 10/21/2011  . Long term current use of anticoagulant therapy 06/27/2010  . Mixed hyperlipidemia 05/21/2008  . CORONARY ATHEROSCLEROSIS NATIVE CORONARY ARTERY 05/21/2008  . KNEE PAIN 06/14/2007    Past Surgical History:  Procedure Laterality Date  . AMPUTATION Left 09/15/2012   Procedure: PARTIAL AMPUTATION 3rd TOE LEFT FOOT;  Surgeon: Marcheta Grammes, DPM;  Location: [facility];  Service: Orthopedics;  Laterality: Left;  . AMPUTATION Left 04/20/2013   Procedure: PARTIAL AMPUTATION 2ND TOE LEFT FOOT;  Surgeon: Marcheta Grammes, DPM;  Location: [facility];  Service: Orthopedics;  Laterality: Left;  . ANAL FISSURE REPAIR    . APPLICATION OF WOUND VAC Right 09/09/2016   Procedure: APPLICATION OF WOUND VAC;  Surgeon: Caprice Beaver, DPM;  Location: [facility];  Service: Podiatry;  Laterality: Right;  . CATARACT EXTRACTION W/PHACO Right 09/30/2015   Procedure: CATARACT EXTRACTION PHACO AND INTRAOCULAR LENS PLACEMENT (IOC);  Surgeon: Tonny Branch, MD;  Location: [facility];  Service: Ophthalmology;  Laterality:  Right;  CDE: 11.78  . CATARACT EXTRACTION W/PHACO Left 10/31/2015   Procedure: CATARACT EXTRACTION PHACO AND INTRAOCULAR LENS PLACEMENT LEFT EYE; CDE:  9.10;  Surgeon: Tonny Branch, MD;  Location: [facility];  Service: Ophthalmology;  Laterality: Left;  . COLONOSCOPY  08/19/2011   Procedure:  COLONOSCOPY;  Surgeon: Rogene Houston, MD;  Location: [facility];  Service: Endoscopy;  Laterality: N/A;  930  . COLONOSCOPY N/A 09/19/2014   Procedure: COLONOSCOPY;  Surgeon: Rogene Houston, MD;  Location: [facility];  Service: Endoscopy;  Laterality: N/A;  1055  . FEMORAL HERNIA REPAIR    . INCISION AND DRAINAGE Right 09/09/2016   Procedure: DEBRIDEMENT WOUND RT heel with wound vac attachment;  Surgeon: Caprice Beaver, DPM;  Location: [facility];  Service: Podiatry;  Laterality: Right;  . INCISION AND DRAINAGE OF WOUND Right 08/12/2016   Procedure: DEBRIDEMENT WOUND RIGHT HEEL;  Surgeon: Caprice Beaver, DPM;  Location: [facility];  Service: Podiatry;  Laterality: Right;  right heel  . MASS EXCISION Right 09/28/2017   Procedure: EXCISION OF EXTERNAL AUDITORY CANAL MASS;  Surgeon: Leta Baptist, MD;  Location: [place];  Service: ENT;  Laterality: Right;  . Open repair left quadriceps tendon.  08/22/07   Dr. Aline Brochure  . Right total knee replacement    . TRANSURETHRAL RESECTION OF PROSTATE    . TYMPANOMASTOIDECTOMY Right 11/10/2017   Procedure: RIGHT TYMPANOMASTOIDECTOMY;  Surgeon: Leta Baptist, MD;  Location: [place];  Service: ENT;  Laterality: Right;  . WOUND EXPLORATION Right 08/12/2016   Procedure: EXPLORATION OF WOUND FOR FOREIGN BODY RIGHT HEEL;  Surgeon: Caprice Beaver, DPM;  Location: [facility];  Service: Podiatry;  Laterality: Right;  right heel        Home Medications    Prior to Admission medications   Medication Sig Start Date End Date Taking? Authorizing Provider  Ascorbic Acid (VITAMIN C) 1000 MG tablet Take 1,000 mg by mouth daily with breakfast.     [provider]  atorvastatin (LIPITOR) 10 MG tablet Take 10 mg by mouth every evening.    [provider]  Calcium Carb-Cholecalciferol (CALCIUM 600+D) 600-800 MG-UNIT TABS Take 2 tablets by mouth daily with breakfast.    [provider]  cholecalciferol (VITAMIN D) 1000  units tablet Take 1,000 Units by mouth every morning.    [provider]  ciclopirox (LOPROX) 0.77 % cream Apply 1 application topically every morning. Applied to feet in the morning    [provider]  Coenzyme Q10 (CO Q-10) 100 MG CAPS Take 100 mg by mouth daily with breakfast.     [provider]  digoxin (LANOXIN) 0.125 MG tablet Take 0.125 mg by mouth daily with breakfast.     [provider]  finasteride (PROSCAR) 5 MG tablet Take 5 mg by mouth every morning.     [provider]  furosemide (LASIX) 20 MG tablet Take  20 mg by mouth on Sun., Tues., Thurs., Sat.,  Take 40 mg on Mon.Wed., Fri.    [provider]  insulin aspart (NOVOLOG FLEXPEN) 100 UNIT/ML FlexPen Give per sliding  From 0-4 units twice a day    [provider]  Insulin Detemir (LEVEMIR FLEXTOUCH) 100 UNIT/ML Pen Inject 60 Units into the skin daily.     [provider]  lisinopril (PRINIVIL,ZESTRIL) 20 MG tablet Take 40 mg by mouth daily with supper. 07/07/16   [provider]  LORazepam (ATIVAN) 1 MG tablet Take 1 tablet (  1 mg total) by mouth at bedtime. 01/18/18   Wille Celeste, PA-C  Multiple Vitamin (MULTIVITAMIN WITH MINERALS) TABS tablet Take 1 tablet by mouth daily with breakfast.    [provider]  nystatin (MYCOSTATIN) 100000 UNIT/ML suspension Take 5 mLs by mouth 3 (three) times daily. Take for 7 days stop date 01/24/2018    [provider]  Omega-3 Fatty Acids (FISH OIL) 1200 MG CAPS Take 2 capsules by mouth 2 (two) times daily.    [provider]  pioglitazone (ACTOS) 15 MG tablet Take 15 mg by mouth every evening.     [provider]  potassium chloride SA (K-DUR,KLOR-CON) 20 MEQ tablet Take 40 mEq by mouth daily. Take starting 01/07/2018     [provider]  traMADol (ULTRAM) 50 MG tablet Take 1 tablet (50 mg total) by mouth every 6 (six) hours as needed for moderate pain. 12/20/17   Wille Celeste, PA-C  VICTOZA 18 MG/3ML SOPN Inject 1.8 mg into the skin daily with breakfast. 06/05/16   [provider]  vitamin E 400 UNIT capsule Take 400 Units by mouth every morning.    [provider]  warfarin (COUMADIN) 5 MG tablet Take 1 tablet daily or as directed 02/03/18   Satira Sark, MD    Family History Family History  Problem Relation Age of Onset  . Atrial fibrillation Mother     Social History Social History   Tobacco Use  . Smoking status: Former Smoker    Years: 10.00    Types: Cigarettes    Last attempt to quit: 04/07/1963    Years since quitting: 54.8  . Smokeless tobacco: Never Used  Substance Use Topics  . Alcohol use: No    Alcohol/week: 0.0 standard drinks  . Drug use: No     Allergies   Erythromycin and Penicillins   Review of Systems Review of Systems  Constitutional:       Per HPI, otherwise negative  HENT:       Per HPI, otherwise negative  Eyes: Positive for visual disturbance. Negative for pain.  Respiratory:       Per HPI, otherwise negative  Cardiovascular:       Per HPI, otherwise negative  Gastrointestinal: Negative for vomiting.  Endocrine:       Negative aside from HPI  Genitourinary:       Neg aside from HPI   Musculoskeletal:       Per HPI, otherwise negative  Skin: Negative.   Neurological: Positive for weakness. Negative for syncope.     Physical Exam Updated Vital Signs BP 137/61 (BP Location: Left Arm)   Pulse 89   Temp (!) 97.4 F (36.3 C) (Temporal)   Resp 20   Ht 6' (1.829 m)   Wt 110.2 kg   SpO2 99%   BMI 32.96 kg/m   Physical Exam  Constitutional: He is oriented to person, place, and time. He has a sickly appearance. No distress.  HENT:  Head: Normocephalic and atraumatic.  Right Ear: There is drainage, swelling and tenderness.  Eyes: Right conjunctiva is injected. Right eye exhibits abnormal extraocular motion. Right pupil is reactive. Left pupil is reactive.    Cardiovascular:  Normal rate and regular rhythm.  Pulmonary/Chest: Effort normal. No stridor. No respiratory distress.  Abdominal: He exhibits no distension.  Musculoskeletal: He exhibits no edema.  Neurological: He is alert and oriented to person, place, and time. He displays atrophy. He displays no tremor. A cranial  nerve deficit is present. He exhibits normal muscle tone. He displays no seizure activity.  Skin: Skin is warm and dry.  Psychiatric: He has a normal mood and affect.  Nursing note and vitals reviewed.    ED Treatments / Results  Labs (all labs ordered are listed, but only abnormal results are displayed) Labs Reviewed  BASIC METABOLIC PANEL - Abnormal; Notable for the following components:      Result Value   Sodium 134 (*)    BUN 28 (*)    Calcium 10.5 (*)    All other components within normal limits  CBC WITH DIFFERENTIAL/PLATELET - Abnormal; Notable for the following components:   WBC 20.5 (*)    Hemoglobin 12.7 (*)    Neutro Abs 8.4 (*)    Lymphs Abs 10.2 (*)    Monocytes Absolute 1.5 (*)    Abs Immature Granulocytes 0.08 (*)    All other components within normal limits  PROTIME-INR - Abnormal; Notable for the following components:   Prothrombin Time 38.6 (*)    INR 4.03 (*)    All other components within normal limits  BRAIN NATRIURETIC PEPTIDE  CBC      EKG Interpretation  Date/Time:  Tuesday February 15 2018 17:54:43 EST Ventricular Rate:  80 PR Interval:    QRS Duration: 141 QT Interval:  403 QTC Calculation: 465 R Axis:   -54 Text Interpretation:  Atrial fibrillation RBBB and LAFB Probable LVH with secondary repol abnrm Abnormal ekg Confirmed by Carmin Muskrat [phone]) on 02/15/2018 5:56:54 PM       Radiology Dg Eye Foreign Body  Result Date: 02/15/2018 CLINICAL DATA:  Metal working/exposure; clearance prior to MRI EXAM: ORBITS FOR FOREIGN BODY - 2 VIEW COMPARISON:  10/26/2017 FINDINGS: There is no evidence of metallic foreign body within the orbits. No  significant bone abnormality identified. Dental hardware evident. Sinuses are clear. Cervical spondylosis noted. IMPRESSION: No evidence of metallic foreign body within the orbits. Electronically Signed   By: Jerilynn Mages.  Shick M.D.   On: 02/15/2018 08:22   Mr Jodene Nam Head Wo Contrast  Result Date: 02/15/2018 CLINICAL DATA:  Right 6th nerve palsy. Diplopia with right eye vision loss. Right-sided facial droop. History of right ear malignant otitis externa, chronic mastoiditis with abscess, and facial nerve paralysis status post canal wall down tympanomastoidectomy. EXAM: MRI HEAD WITHOUT AND WITH CONTRAST MRA HEAD WITHOUT CONTRAST TECHNIQUE: Multiplanar, multiecho pulse sequences of the brain and surrounding structures were obtained without and with intravenous contrast. Angiographic images of the head were obtained using MRA technique without contrast. CONTRAST:  10 mL Gadavist COMPARISON:  10/26/2017 brain MRI FINDINGS: MRI HEAD FINDINGS Brain: There is progressive, large volume fluid throughout the right mastoid air cells. There is new fluid in the right petrous apex with extensive surrounding enhancement which extends into the soft tissues inferior to the skull base. Enhancement also extends into the right suboccipital scalp soft tissues. There is new abnormal dural thickening and enhancement throughout the posterior fossa on the right including along the clivus measuring up to 5 mm in thickness. Within the dural thickening along the clivus in a right paramedian location is a nonenhancing focus measuring 7 x 2 x 13 mm (series 18, image 32 and series 20, image 9). Abnormal enhancement extends into Meckel's cave on the right as well as into the posterior aspect of the right cavernous sinus with dural thickening along the medial aspect of the right middle cranial fossa. No acute infarct or intracranial hemorrhage is identified. There  is moderate cerebral atrophy. No significant cerebral white matter disease is seen for age.  Vascular: Major intracranial arterial flow voids are preserved. There is new loss of the right transverse and sigmoid sinus flow void with suggestion of nonocclusive thrombus on postcontrast images. Skull and upper cervical spine: New abnormal marrow precontrast T1 hypointensity and enhancement within the right aspect of the clivus and petrous apex consistent with osteomyelitis. Sinuses/Orbits: Mild mucosal thickening in the right ethmoid, maxillary, and sphenoid sinuses. Clear left mastoid air cells. Bilateral cataract extraction. Other: None. MRA HEAD FINDINGS The visualized distal vertebral arteries are patent to the basilar and codominant. Patent PICA and SCA origins are identified bilaterally. The basilar artery is widely patent. Posterior communicating arteries are not identified and may be diminutive or absent. PCAs are patent with symmetric branch vessel attenuation but no significant proximal stenosis. The internal carotid arteries are patent without evidence of flow limiting stenosis. The right ICA appears slightly narrowed just below the skull base as it courses through the above described inflammatory process. ACAs and MCAs are patent without evidence of significant M1 stenosis. Mildly diminished signal in the proximal M2 segments bilaterally is artifactual, however there may be superimposed mild-to-moderate left greater than right atherosclerotic irregularity and narrowing. Right A1 segment irregularity and proximal right A2 signal loss are favored to be artifactual. No aneurysm is identified. IMPRESSION: 1. Increased fluid throughout the right mastoid air cells now including the petrous apex with extensive surrounding enhancement consistent with petrous apicitis and skull base osteomyelitis. 2. New regional dural thickening most notable along the clivus with suspected small retroclival epidural abscess. 3. New thrombus in the right transverse and right sigmoid sinuses. 4. No arterial large vessel  occlusion or definite flow limiting proximal stenosis. These results were called by telephone at the time of interpretation on 02/15/2018 at 12:10 pm to Dr. Michel Bickers , who verbally acknowledged these results. Electronically Signed   By: Logan Bores M.D.   On: 02/15/2018 12:14   Mr Jeri Cos GP Contrast  Result Date: 02/15/2018 CLINICAL DATA:  Right 6th nerve palsy. Diplopia with right eye vision loss. Right-sided facial droop. History of right ear malignant otitis externa, chronic mastoiditis with abscess, and facial nerve paralysis status post canal wall down tympanomastoidectomy. EXAM: MRI HEAD WITHOUT AND WITH CONTRAST MRA HEAD WITHOUT CONTRAST TECHNIQUE: Multiplanar, multiecho pulse sequences of the brain and surrounding structures were obtained without and with intravenous contrast. Angiographic images of the head were obtained using MRA technique without contrast. CONTRAST:  10 mL Gadavist COMPARISON:  10/26/2017 brain MRI FINDINGS: MRI HEAD FINDINGS Brain: There is progressive, large volume fluid throughout the right mastoid air cells. There is new fluid in the right petrous apex with extensive surrounding enhancement which extends into the soft tissues inferior to the skull base. Enhancement also extends into the right suboccipital scalp soft tissues. There is new abnormal dural thickening and enhancement throughout the posterior fossa on the right including along the clivus measuring up to 5 mm in thickness. Within the dural thickening along the clivus in a right paramedian location is a nonenhancing focus measuring 7 x 2 x 13 mm (series 18, image 32 and series 20, image 9). Abnormal enhancement extends into Meckel's cave on the right as well as into the posterior aspect of the right cavernous sinus with dural thickening along the medial aspect of the right middle cranial fossa. No acute infarct or intracranial hemorrhage is identified. There is moderate cerebral atrophy. No significant cerebral  white  matter disease is seen for age. Vascular: Major intracranial arterial flow voids are preserved. There is new loss of the right transverse and sigmoid sinus flow void with suggestion of nonocclusive thrombus on postcontrast images. Skull and upper cervical spine: New abnormal marrow precontrast T1 hypointensity and enhancement within the right aspect of the clivus and petrous apex consistent with osteomyelitis. Sinuses/Orbits: Mild mucosal thickening in the right ethmoid, maxillary, and sphenoid sinuses. Clear left mastoid air cells. Bilateral cataract extraction. Other: None. MRA HEAD FINDINGS The visualized distal vertebral arteries are patent to the basilar and codominant. Patent PICA and SCA origins are identified bilaterally. The basilar artery is widely patent. Posterior communicating arteries are not identified and may be diminutive or absent. PCAs are patent with symmetric branch vessel attenuation but no significant proximal stenosis. The internal carotid arteries are patent without evidence of flow limiting stenosis. The right ICA appears slightly narrowed just below the skull base as it courses through the above described inflammatory process. ACAs and MCAs are patent without evidence of significant M1 stenosis. Mildly diminished signal in the proximal M2 segments bilaterally is artifactual, however there may be superimposed mild-to-moderate left greater than right atherosclerotic irregularity and narrowing. Right A1 segment irregularity and proximal right A2 signal loss are favored to be artifactual. No aneurysm is identified. IMPRESSION: 1. Increased fluid throughout the right mastoid air cells now including the petrous apex with extensive surrounding enhancement consistent with petrous apicitis and skull base osteomyelitis. 2. New regional dural thickening most notable along the clivus with suspected small retroclival epidural abscess. 3. New thrombus in the right transverse and right sigmoid  sinuses. 4. No arterial large vessel occlusion or definite flow limiting proximal stenosis. These results were called by telephone at the time of interpretation on 02/15/2018 at 12:10 pm to Dr. Michel Bickers , who verbally acknowledged these results. Electronically Signed   By: Logan Bores M.D.   On: 02/15/2018 12:14    Procedures Procedures (including critical care time)  Medications Ordered in ED Medications - No data to display   Initial Impression / Assessment and Plan / ED Course  I have reviewed the triage vital signs and the nursing notes.  Pertinent labs & imaging results that were available during my care of the patient were reviewed by me and considered in my medical decision making (see chart for details).    To my initial evaluation I reviewed the patient's chart, including MRI results, as above. Subsequently discussed patient's case with 2 ENT physicians including the patient's own, our infectious disease team, and our neurosurgeon on-call. We discussed the patient's presentation, his MRI, his recent history of recurrent disease, concern for mastoiditis with epidural abscess. Subsequent discussed patient case with our hospitalist colleagues as well to facilitate transfer to our advanced care center. We agreed patient will benefit from potential biopsy prior to initiation of antibiotics. Our infectious disease team recommends consideration of vancomycin, cefepime as potential regimen, once this procedure has been performed.  5:11 PM Patient and family aware of all findings, results, need for transfer. I also discussed the conversations ahead with the above specialists, need for biopsy, initiation of antibiotics, broad-spectrum given concern for epidural abscess, mastoiditis. Patient preparing for transfer to our advanced care center.  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 Critical care was necessary  to treat or prevent imminent or life-threatening deterioration. Critical care was time spent personally by me on the following activities: development of treatment plan with patient and/or surrogate as well as nursing, discussions with consultants, evaluation of patient's response to treatment, examination of patient, obtaining history from patient or surrogate, ordering and performing treatments and interventions, ordering and review of laboratory studies, ordering and review of radiographic studies, pulse oximetry and re-evaluation of patient's condition.    Carmin Muskrat, MD 02/15/18 1712    Carmin Muskrat, MD 02/15/18 2139

## 2018-02-15 NOTE — Progress Notes (Signed)
Spoke with Dr. Benjamine Mola (ENT) regarding pt going to OR for Biopsy, pt to be kept NPO. Asked Dr. Benjamine Mola, if order to culture right ear should be collected on floor, Dr. Benjamine Mola stated this will be done in the OR. Pt family notified that patient has been placed on a list for a OR bed, and that Dr. Benjamine Mola will meet with Pt and Family prior to procedure. Arlis Porta

## 2018-02-15 NOTE — ED Notes (Signed)
Report given to [facility]

## 2018-02-15 NOTE — ED Notes (Signed)
Date and time results received: 02/15/18 1531 (use smartphrase ".now" to insert current time)  Test:INR Critical Value:4.03  Name of Provider Notified: Dr Vanita Panda Orders Received? Or Actions Taken?:NA

## 2018-02-15 NOTE — ED Triage Notes (Signed)
Pts wife states he had an MRI done this morning and was told to come to the ER because pt had a "massive infection in his brain." Pt denies fevers. Pt denies N/V

## 2018-02-15 NOTE — H&P (Addendum)
History and Physical    [name] [name] ZOX:[id number] DOB: Nov 30, 1934 DOA: 02/15/2018  PCP: [name], MD Patient coming from: Home  Chief Complaint: Sent here for evaluation of abnormal MRI  HPI: [name] [name] is a 83 y.o. male with medical history significant of chronic A. fib, CLL, coronary artery disease, hypertension, hyperlipidemia, IgA nephropathy, type 2 diabetes, history of recurrent mastoiditis, prior partial excision, ongoing topical antibiotic therapy, history of right facial palsy presenting to the hospital for evaluation of abnormal MRI.  Patient has poor hearing and ongoing illness in his right ear, history was provided by wife at bedside.  Wife states that the patient has had 2 right ear surgeries since June and has had Bell's palsy since July.  For the past 1 week, he has been having double vision in his right eye, as such, he was taken to a neuro-ophthalmologist in [facility] yesterday.  Patient had an MRI performed this morning and after being informed of abnormal results is here for evaluation.  He has not been having any fevers or chills.  His appetite has been reduced for the past few months due to Bell's palsy.  Patient denied having any pain.  Patient was admitted from December 13, 2017 to December 18, 2017 for acute mastoiditis/right otitis media.  PICC line was placed and he was treated with IV antibiotics (ceftriaxone, vancomycin, metronidazole) through January 23, 2018.  ED Course: Afebrile and hemodynamically stable.  White count 20.5.  BNP normal.  INR 4.03.  X-ray of eyes showing no evidence of metallic foreign body within the orbits.  MRI showing increased fluid throughout the right mastoid cells now including the petrous apex with extensive surrounding enhancement consistent with petrous apicitis and skull base osteomyelitis.  In addition, showing new regional dural thickening most notable along the clivus with suspected small retroclival epidural abscess.  There is  also a new thrombus in the right transverse and right sigmoid sinuses.  ED physician informed me that he spoke to ENT (Dr. Benjamine Mola), neurosurgery (Dr. Christella Noa), and infectious disease (Dr. Linus Salmons).  ENT recommended transferring the patient to [facility] for biopsy and not starting antibiotics until the biopsy is done.  Neurosurgery does not have any recommendations at this time.  Infectious disease recommended starting vancomycin and cefepime after the biopsy.  Review of Systems: As per HPI otherwise 10 point review of systems negative.  Past Medical History:  Diagnosis Date  . Atrial fibrillation ([place])   . Chronic lymphocytic leukemia ([place])   . Coronary atherosclerosis of native coronary artery    Nonobstructive  . Essential hypertension, benign   . Hyperlipidemia   . IgA nephropathy    Dr. Lowanda Foster  . Type 2 diabetes mellitus ([place])   . UTI (urinary tract infection) july  10 th   taking  med     Past Surgical History:  Procedure Laterality Date  . AMPUTATION Left 09/15/2012   Procedure: PARTIAL AMPUTATION 3rd TOE LEFT FOOT;  Surgeon: Marcheta Grammes, DPM;  Location: [facility];  Service: Orthopedics;  Laterality: Left;  . AMPUTATION Left 04/20/2013   Procedure: PARTIAL AMPUTATION 2ND TOE LEFT FOOT;  Surgeon: Marcheta Grammes, DPM;  Location: [facility];  Service: Orthopedics;  Laterality: Left;  . ANAL FISSURE REPAIR    . APPLICATION OF WOUND VAC Right 09/09/2016   Procedure: APPLICATION OF WOUND VAC;  Surgeon: Caprice Beaver, DPM;  Location: [facility];  Service: Podiatry;  Laterality: Right;  . CATARACT EXTRACTION W/PHACO Right 09/30/2015   Procedure:  CATARACT EXTRACTION PHACO AND INTRAOCULAR LENS PLACEMENT (IOC);  Surgeon: Tonny Branch, MD;  Location: [facility];  Service: Ophthalmology;  Laterality: Right;  CDE: 11.78  . CATARACT EXTRACTION W/PHACO Left 10/31/2015   Procedure: CATARACT EXTRACTION PHACO AND INTRAOCULAR LENS PLACEMENT LEFT EYE; CDE:  9.10;  Surgeon: Tonny Branch, MD;  Location:  [facility];  Service: Ophthalmology;  Laterality: Left;  . COLONOSCOPY  08/19/2011   Procedure: COLONOSCOPY;  Surgeon: Rogene Houston, MD;  Location: [facility];  Service: Endoscopy;  Laterality: N/A;  930  . COLONOSCOPY N/A 09/19/2014   Procedure: COLONOSCOPY;  Surgeon: Rogene Houston, MD;  Location: [facility];  Service: Endoscopy;  Laterality: N/A;  1055  . FEMORAL HERNIA REPAIR    . INCISION AND DRAINAGE Right 09/09/2016   Procedure: DEBRIDEMENT WOUND RT heel with wound vac attachment;  Surgeon: Caprice Beaver, DPM;  Location: [facility];  Service: Podiatry;  Laterality: Right;  . INCISION AND DRAINAGE OF WOUND Right 08/12/2016   Procedure: DEBRIDEMENT WOUND RIGHT HEEL;  Surgeon: Caprice Beaver, DPM;  Location: [facility];  Service: Podiatry;  Laterality: Right;  right heel  . MASS EXCISION Right 09/28/2017   Procedure: EXCISION OF EXTERNAL AUDITORY CANAL MASS;  Surgeon: Leta Baptist, MD;  Location: [facility];  Service: ENT;  Laterality: Right;  . Open repair left quadriceps tendon.  08/22/07   Dr. Aline Brochure  . Right total knee replacement    . TRANSURETHRAL RESECTION OF PROSTATE    . TYMPANOMASTOIDECTOMY Right 11/10/2017   Procedure: RIGHT TYMPANOMASTOIDECTOMY;  Surgeon: Leta Baptist, MD;  Location: [facility];  Service: ENT;  Laterality: Right;  . WOUND EXPLORATION Right 08/12/2016   Procedure: EXPLORATION OF WOUND FOR FOREIGN BODY RIGHT HEEL;  Surgeon: Caprice Beaver, DPM;  Location: [facility];  Service: Podiatry;  Laterality: Right;  right heel     reports that he quit smoking about 54 years ago. His smoking use included cigarettes. He quit after 10.00 years of use. He has never used smokeless tobacco. He reports that he does not drink alcohol or use drugs.  Allergies  Allergen Reactions  . Erythromycin Other (See Comments)  . Penicillins Hives and Other (See Comments)    Has patient had a PCN reaction causing immediate rash, facial/tongue/throat swelling, SOB or  lightheadedness with hypotension: No Has patient had a PCN reaction causing severe rash involving mucus membranes or skin necrosis: No Has patient had a PCN reaction that required hospitalization No Has patient had a PCN reaction occurring within the last 10 years: No If all of the above answers are "NO", then may proceed with Cephalosporin use.  Has tolerated Rocephin    Family History  Problem Relation Age of Onset  . Atrial fibrillation Mother     Prior to Admission medications   Medication Sig Start Date End Date Taking? Authorizing Provider  Ascorbic Acid (VITAMIN C) 1000 MG tablet Take 1,000 mg by mouth daily with breakfast.     [provider]  atorvastatin (LIPITOR) 10 MG tablet Take 10 mg by mouth every evening.    [provider]  Calcium Carb-Cholecalciferol (CALCIUM 600+D) 600-800 MG-UNIT TABS Take 2 tablets by mouth daily with breakfast.    [provider]  cholecalciferol (VITAMIN D) 1000 units tablet Take 1,000 Units by mouth every morning.    [provider]  ciclopirox (LOPROX) 0.77 % cream Apply 1 application topically every morning. Applied to feet in the morning    [provider]  Coenzyme Q10 (CO Q-10) 100 MG CAPS Take 100 mg by mouth daily with breakfast.     [provider]  digoxin (LANOXIN) 0.125 MG tablet Take 0.125 mg by mouth daily with breakfast.     [provider]  finasteride (PROSCAR) 5 MG tablet Take 5 mg by mouth every morning.     [provider]  furosemide (LASIX) 20 MG tablet Take  20 mg by mouth on Sun., Tues., Thurs., Sat.,  Take 40 mg on Mon.Wed., Fri.    [provider]  insulin aspart (NOVOLOG FLEXPEN) 100 UNIT/ML FlexPen Give per sliding  From 0-4 units twice a day    [provider]  Insulin Detemir (LEVEMIR FLEXTOUCH) 100 UNIT/ML Pen Inject 60 Units into the skin daily.     [provider]  lisinopril (PRINIVIL,ZESTRIL) 20 MG tablet Take 40 mg  by mouth daily with supper. 07/07/16   [provider]  LORazepam (ATIVAN) 1 MG tablet Take 1 tablet (1 mg total) by mouth at bedtime. 01/18/18   Wille Celeste, PA-C  Multiple Vitamin (MULTIVITAMIN WITH MINERALS) TABS tablet Take 1 tablet by mouth daily with breakfast.    [provider]  nystatin (MYCOSTATIN) 100000 UNIT/ML suspension Take 5 mLs by mouth 3 (three) times daily. Take for 7 days stop date 01/24/2018    [provider]  Omega-3 Fatty Acids (FISH OIL) 1200 MG CAPS Take 2 capsules by mouth 2 (two) times daily.    [provider]  pioglitazone (ACTOS) 15 MG tablet Take 15 mg by mouth every evening.     [provider]  potassium chloride SA (K-DUR,KLOR-CON) 20 MEQ tablet Take 40 mEq by mouth daily. Take starting 01/07/2018     [provider]  traMADol (ULTRAM) 50 MG tablet Take 1 tablet (50 mg total) by mouth every 6 (six) hours as needed for moderate pain. 12/20/17   Wille Celeste, PA-C  VICTOZA 18 MG/3ML SOPN Inject 1.8 mg into the skin daily with breakfast. 06/05/16   [provider]  vitamin E 400 UNIT capsule Take 400 Units by mouth every morning.    [provider]  warfarin (COUMADIN) 5 MG tablet Take 1 tablet daily or as directed 02/03/18   Satira Sark, MD    Physical Exam: Vitals:   02/15/18 1408 02/15/18 1600 02/15/18 1700 02/15/18 1730  BP:  (!) 139/49 (!) 109/57 127/60  Pulse:  75 75 81  Resp:      Temp:      TempSrc:      SpO2:  97% 91% 97%  Weight: 110.2 kg     Height: 6' (1.829 m)       Physical Exam  Constitutional: He is oriented to person, place, and time. No distress.  Sleeping comfortably in a hospital stretcher  HENT:  Pus drainage noted in the right ear canal  Eyes: Pupils are equal, round, and reactive to light. Right conjunctiva is injected. Right eye exhibits abnormal extraocular motion.  Neck: Neck supple. No tracheal deviation present.  Cardiovascular: Normal rate,  regular rhythm and intact distal pulses.  Pulmonary/Chest: Effort normal. No respiratory distress. He has no wheezes. He has no rales.  Abdominal: Soft. Bowel sounds are normal. He exhibits no distension. There is no tenderness. There is no guarding.  Musculoskeletal: He exhibits no edema.  Neurological: He is alert and oriented to person, place, and time. A cranial nerve deficit is present.  Somnolent but waking up on command and answering  questions appropriately Moving all extremities spontaneously Decreased hearing Abnormal extraocular movements of the right eye  Skin: Skin is warm and dry. He is not diaphoretic.     Labs on Admission: I have personally reviewed following labs and imaging studies  CBC: Recent Labs  Lab 02/15/18 1449  WBC 20.5*  NEUTROABS 8.4*  HGB 12.7*  HCT 40.1  MCV 86.8  PLT 417   Basic Metabolic Panel: Recent Labs  Lab 02/15/18 1449  NA 134*  K 4.3  CL 98  CO2 27  GLUCOSE 79  BUN 28*  CREATININE 0.78  CALCIUM 10.5*   GFR: Estimated Creatinine Clearance: 92.8 mL/min (by C-G formula based on SCr of 0.78 mg/dL). Liver Function Tests: No results for input(s): AST, ALT, ALKPHOS, BILITOT, PROT, ALBUMIN in the last 168 hours. No results for input(s): LIPASE, AMYLASE in the last 168 hours. No results for input(s): AMMONIA in the last 168 hours. Coagulation Profile: Recent Labs  Lab 02/15/18 1449  INR 4.03*   Cardiac Enzymes: No results for input(s): CKTOTAL, CKMB, CKMBINDEX, TROPONINI in the last 168 hours. BNP (last 3 results) No results for input(s): PROBNP in the last 8760 hours. HbA1C: No results for input(s): HGBA1C in the last 72 hours. CBG: No results for input(s): GLUCAP in the last 168 hours. Lipid Profile: No results for input(s): CHOL, HDL, LDLCALC, TRIG, CHOLHDL, LDLDIRECT in the last 72 hours. Thyroid Function Tests: No results for input(s): TSH, T4TOTAL, FREET4, T3FREE, THYROIDAB in the last 72 hours. Anemia Panel: No  results for input(s): VITAMINB12, FOLATE, FERRITIN, TIBC, IRON, RETICCTPCT in the last 72 hours. Urine analysis:    Component Value Date/Time   COLORURINE YELLOW 12/14/2017 0050   APPEARANCEUR CLEAR 12/14/2017 0050   LABSPEC 1.008 12/14/2017 0050   PHURINE 6.0 12/14/2017 0050   GLUCOSEU NEGATIVE 12/14/2017 0050   HGBUR NEGATIVE 12/14/2017 0050   BILIRUBINUR NEGATIVE 12/14/2017 0050   KETONESUR NEGATIVE 12/14/2017 0050   PROTEINUR NEGATIVE 12/14/2017 0050   NITRITE NEGATIVE 12/14/2017 0050   LEUKOCYTESUR NEGATIVE 12/14/2017 0050    Radiological Exams on Admission: Dg Eye Foreign Body  Result Date: 02/15/2018 CLINICAL DATA:  Metal working/exposure; clearance prior to MRI EXAM: ORBITS FOR FOREIGN BODY - 2 VIEW COMPARISON:  10/26/2017 FINDINGS: There is no evidence of metallic foreign body within the orbits. No significant bone abnormality identified. Dental hardware evident. Sinuses are clear. Cervical spondylosis noted. IMPRESSION: No evidence of metallic foreign body within the orbits. Electronically Signed   By: Jerilynn Mages.  Shick M.D.   On: 02/15/2018 08:22   Mr Jodene Nam Head Wo Contrast  Result Date: 02/15/2018 CLINICAL DATA:  Right 6th nerve palsy. Diplopia with right eye vision loss. Right-sided facial droop. History of right ear malignant otitis externa, chronic mastoiditis with abscess, and facial nerve paralysis status post canal wall down tympanomastoidectomy. EXAM: MRI HEAD WITHOUT AND WITH CONTRAST MRA HEAD WITHOUT CONTRAST TECHNIQUE: Multiplanar, multiecho pulse sequences of the brain and surrounding structures were obtained without and with intravenous contrast. Angiographic images of the head were obtained using MRA technique without contrast. CONTRAST:  10 mL Gadavist COMPARISON:  10/26/2017 brain MRI FINDINGS: MRI HEAD FINDINGS Brain: There is progressive, large volume fluid throughout the right mastoid air cells. There is new fluid in the right petrous apex with extensive surrounding  enhancement which extends into the soft tissues inferior to the skull base. Enhancement also extends into the right suboccipital scalp soft tissues. There is new abnormal dural thickening and enhancement throughout the posterior fossa on the right  including along the clivus measuring up to 5 mm in thickness. Within the dural thickening along the clivus in a right paramedian location is a nonenhancing focus measuring 7 x 2 x 13 mm (series 18, image 32 and series 20, image 9). Abnormal enhancement extends into Meckel's cave on the right as well as into the posterior aspect of the right cavernous sinus with dural thickening along the medial aspect of the right middle cranial fossa. No acute infarct or intracranial hemorrhage is identified. There is moderate cerebral atrophy. No significant cerebral white matter disease is seen for age. Vascular: Major intracranial arterial flow voids are preserved. There is new loss of the right transverse and sigmoid sinus flow void with suggestion of nonocclusive thrombus on postcontrast images. Skull and upper cervical spine: New abnormal marrow precontrast T1 hypointensity and enhancement within the right aspect of the clivus and petrous apex consistent with osteomyelitis. Sinuses/Orbits: Mild mucosal thickening in the right ethmoid, maxillary, and sphenoid sinuses. Clear left mastoid air cells. Bilateral cataract extraction. Other: None. MRA HEAD FINDINGS The visualized distal vertebral arteries are patent to the basilar and codominant. Patent PICA and SCA origins are identified bilaterally. The basilar artery is widely patent. Posterior communicating arteries are not identified and may be diminutive or absent. PCAs are patent with symmetric branch vessel attenuation but no significant proximal stenosis. The internal carotid arteries are patent without evidence of flow limiting stenosis. The right ICA appears slightly narrowed just below the skull base as it courses through the  above described inflammatory process. ACAs and MCAs are patent without evidence of significant M1 stenosis. Mildly diminished signal in the proximal M2 segments bilaterally is artifactual, however there may be superimposed mild-to-moderate left greater than right atherosclerotic irregularity and narrowing. Right A1 segment irregularity and proximal right A2 signal loss are favored to be artifactual. No aneurysm is identified. IMPRESSION: 1. Increased fluid throughout the right mastoid air cells now including the petrous apex with extensive surrounding enhancement consistent with petrous apicitis and skull base osteomyelitis. 2. New regional dural thickening most notable along the clivus with suspected small retroclival epidural abscess. 3. New thrombus in the right transverse and right sigmoid sinuses. 4. No arterial large vessel occlusion or definite flow limiting proximal stenosis. These results were called by telephone at the time of interpretation on 02/15/2018 at 12:10 pm to Dr. Michel Bickers , who verbally acknowledged these results. Electronically Signed   By: Logan Bores M.D.   On: 02/15/2018 12:14   Mr Jeri Cos QB Contrast  Result Date: 02/15/2018 CLINICAL DATA:  Right 6th nerve palsy. Diplopia with right eye vision loss. Right-sided facial droop. History of right ear malignant otitis externa, chronic mastoiditis with abscess, and facial nerve paralysis status post canal wall down tympanomastoidectomy. EXAM: MRI HEAD WITHOUT AND WITH CONTRAST MRA HEAD WITHOUT CONTRAST TECHNIQUE: Multiplanar, multiecho pulse sequences of the brain and surrounding structures were obtained without and with intravenous contrast. Angiographic images of the head were obtained using MRA technique without contrast. CONTRAST:  10 mL Gadavist COMPARISON:  10/26/2017 brain MRI FINDINGS: MRI HEAD FINDINGS Brain: There is progressive, large volume fluid throughout the right mastoid air cells. There is new fluid in the right petrous  apex with extensive surrounding enhancement which extends into the soft tissues inferior to the skull base. Enhancement also extends into the right suboccipital scalp soft tissues. There is new abnormal dural thickening and enhancement throughout the posterior fossa on the right including along the clivus measuring up to 5 mm  in thickness. Within the dural thickening along the clivus in a right paramedian location is a nonenhancing focus measuring 7 x 2 x 13 mm (series 18, image 32 and series 20, image 9). Abnormal enhancement extends into Meckel's cave on the right as well as into the posterior aspect of the right cavernous sinus with dural thickening along the medial aspect of the right middle cranial fossa. No acute infarct or intracranial hemorrhage is identified. There is moderate cerebral atrophy. No significant cerebral white matter disease is seen for age. Vascular: Major intracranial arterial flow voids are preserved. There is new loss of the right transverse and sigmoid sinus flow void with suggestion of nonocclusive thrombus on postcontrast images. Skull and upper cervical spine: New abnormal marrow precontrast T1 hypointensity and enhancement within the right aspect of the clivus and petrous apex consistent with osteomyelitis. Sinuses/Orbits: Mild mucosal thickening in the right ethmoid, maxillary, and sphenoid sinuses. Clear left mastoid air cells. Bilateral cataract extraction. Other: None. MRA HEAD FINDINGS The visualized distal vertebral arteries are patent to the basilar and codominant. Patent PICA and SCA origins are identified bilaterally. The basilar artery is widely patent. Posterior communicating arteries are not identified and may be diminutive or absent. PCAs are patent with symmetric branch vessel attenuation but no significant proximal stenosis. The internal carotid arteries are patent without evidence of flow limiting stenosis. The right ICA appears slightly narrowed just below the skull  base as it courses through the above described inflammatory process. ACAs and MCAs are patent without evidence of significant M1 stenosis. Mildly diminished signal in the proximal M2 segments bilaterally is artifactual, however there may be superimposed mild-to-moderate left greater than right atherosclerotic irregularity and narrowing. Right A1 segment irregularity and proximal right A2 signal loss are favored to be artifactual. No aneurysm is identified. IMPRESSION: 1. Increased fluid throughout the right mastoid air cells now including the petrous apex with extensive surrounding enhancement consistent with petrous apicitis and skull base osteomyelitis. 2. New regional dural thickening most notable along the clivus with suspected small retroclival epidural abscess. 3. New thrombus in the right transverse and right sigmoid sinuses. 4. No arterial large vessel occlusion or definite flow limiting proximal stenosis. These results were called by telephone at the time of interpretation on 02/15/2018 at 12:10 pm to Dr. Michel Bickers , who verbally acknowledged these results. Electronically Signed   By: Logan Bores M.D.   On: 02/15/2018 12:14    EKG: Not done in the ED.   Assessment/Plan Principal Problem:   Osteomyelitis of skull (HCC) Active Problems:   HLD (hyperlipidemia)   CORONARY ATHEROSCLEROSIS NATIVE CORONARY ARTERY   Type 2 diabetes mellitus (HCC)   Essential hypertension   Facial palsy   Atrial fibrillation, chronic   Chronic diastolic CHF (congestive heart failure) (HCC)   Epidural abscess   Anemia   Physical deconditioning   Anxiety   BPH (benign prostatic hyperplasia)   Chronic pain   Skull base osteomyelitis, epidural abscess, sigmoid sinus thrombus Afebrile and hemodynamically stable.  White count 20.5. MRI showing increased fluid throughout the right mastoid cells now including the petrous apex with extensive surrounding enhancement consistent with petrous apicitis and skull base  osteomyelitis.  In addition, showing new regional dural thickening most notable along the clivus with suspected small retroclival epidural abscess.  There is also a new thrombus in the right transverse and right sigmoid sinuses.  ED physician informed me that he spoke to ENT (Dr. Benjamine Mola), neurosurgery (Dr. Christella Noa), and infectious disease (Dr.  Comer).  ENT recommended transferring the patient to [facility] for biopsy and not starting antibiotics until the biopsy is done.  Neurosurgery does not have any recommendations at this time.  Infectious disease recommended starting vancomycin and cefepime after the biopsy. -Transfer patient to stepdown [facility] at [facility] -ENT following; appreciate recs -Start vancomycin and cefepime after biopsy is done -Keep n.p.o. at this time -Frequent neurochecks -Repeat CBC in a.m.  ADDENDUM 10:30 pm: I just called Dr. Benjamine Mola to inform him that the patient is now at [facility]. He stated there is no need for a biopsy or surgery at this time. He advised doing a culture of the ear drainage and starting antibiotics now. He will see the patient in the morning.   Chronic A. Fib -[id number] 6. -INR currently supratherapeutic at 4.03.  Hold home warfarin. -Heart rate currently in 70s.  Check EKG. -Currently not on the rate control agent.  Chronic anemia -Stable.  Hemoglobin 12.7, baseline 12-13.  Right facial palsy Family reports patient having decreased p.o. intake since he has had facial palsy.  Undergoes speech therapy as outpatient.  -SLP eval  Physical deconditioning -PT eval  Type 2 diabetes A1c 9.5 on December 14, 2017.  Blood glucose 79. -Blood glucose 79.  Hold home basal insulin at this time as patient is n.p.o. -Sliding scale insulin sensitive -CBG checks  Essential hypertension -Currently normotensive -Hold home Lasix and lisinopril at this time -IV hydralazine 5 mg every 4 hours as needed for SBP >330  Chronic diastolic congestive heart  failure -Stable.  Not volume overloaded on exam. -Continue home digoxin -Hold home Lasix at this time  Coronary artery disease -Stable.  No anginal symptoms at this time.  Hyperlipidemia -Continue home Lipitor  BPH -Continue home finasteride  Anxiety -Continue home Ativan  Chronic pain -Continue home tramadol prn  DVT prophylaxis: SCDs Code Status: Patient wishes to be full code. Family Communication: Wife and other family members at bedside. Disposition Plan: Anticipate discharge to home in 2 to 3 days. Consults called: ENT (Dr. Benjamine Mola), neurosurgery (Dr. Christella Noa), infectious disease (Dr. Linus Salmons) Admission status: It is my clinical opinion that admission to INPATIENT is reasonable and necessary in this 83 y.o. male . presenting duration of abnormal MRI, concerning for skull base osteomyelitis, epidural abscess, sigmoid sinus thrombus . in the context of PMH including: Recurrent mastoiditis, right facial palsy . with pertinent positives on physical exam including: Pus drainage from right ear, abnormal cranial nerve examination . and pertinent positives on radiographic and laboratory data including: Elevated white count. MRI showing petrous apicitis and skull base osteomyelitis; small retroclival epidural abscess; new thrombus in the right transverse and right sigmoid sinuses.   . Workup and treatment include transferring the patient to [facility].  ENT will be doing a biopsy and patient will be started on IV antibiotics afterwards.  Given the aforementioned, the predictability of an adverse outcome is felt to be significant. I expect that the patient will require at least 2 midnights in the hospital to treat this condition.    Shela Leff MD [facility] Pager [phone]  If 7PM-7AM, please contact night-coverage [email or web address] Password [facility]  02/15/2018, 5:51 PM

## 2018-02-16 ENCOUNTER — Encounter (HOSPITAL_COMMUNITY)
Admission: EM | Disposition: A | Discharge status: Skilled Nursing Facility | Payer: Self-pay | Source: Home / Self Care | Attending: Internal Medicine

## 2018-02-16 ENCOUNTER — Encounter (HOSPITAL_COMMUNITY): Payer: Self-pay | Admitting: Anesthesiology

## 2018-02-16 ENCOUNTER — Inpatient Hospital Stay (HOSPITAL_COMMUNITY): Payer: Medicare Other | Admitting: Anesthesiology

## 2018-02-16 DIAGNOSIS — F419 Anxiety disorder, unspecified: Secondary | ICD-10-CM

## 2018-02-16 DIAGNOSIS — G062 Extradural and subdural abscess, unspecified: Secondary | ICD-10-CM

## 2018-02-16 DIAGNOSIS — M869 Osteomyelitis, unspecified: Secondary | ICD-10-CM

## 2018-02-16 DIAGNOSIS — N400 Enlarged prostate without lower urinary tract symptoms: Secondary | ICD-10-CM

## 2018-02-16 DIAGNOSIS — I2510 Atherosclerotic heart disease of native coronary artery without angina pectoris: Secondary | ICD-10-CM

## 2018-02-16 DIAGNOSIS — G8929 Other chronic pain: Secondary | ICD-10-CM

## 2018-02-16 DIAGNOSIS — E1169 Type 2 diabetes mellitus with other specified complication: Secondary | ICD-10-CM

## 2018-02-16 DIAGNOSIS — H70091 Acute mastoiditis with other complications, right ear: Secondary | ICD-10-CM

## 2018-02-16 DIAGNOSIS — I5032 Chronic diastolic (congestive) heart failure: Secondary | ICD-10-CM

## 2018-02-16 HISTORY — PX: MASTOIDECTOMY: SHX711

## 2018-02-16 HISTORY — PX: INCISION AND DRAINAGE ABSCESS: SHX5864

## 2018-02-16 LAB — CBC
HCT: 39 % (ref 39.0–52.0)
Hemoglobin: 12.1 g/dL — ABNORMAL LOW (ref 13.0–17.0)
MCH: 26.7 pg (ref 26.0–34.0)
MCHC: 31 g/dL (ref 30.0–36.0)
MCV: 85.9 fL (ref 80.0–100.0)
PLATELETS: 310 10*3/uL (ref 150–400)
RBC: 4.54 MIL/uL (ref 4.22–5.81)
RDW: 14.6 % (ref 11.5–15.5)
WBC: 17.9 10*3/uL — ABNORMAL HIGH (ref 4.0–10.5)
nRBC: 0 % (ref 0.0–0.2)

## 2018-02-16 LAB — PROTIME-INR
INR: 3.11
INR: 1.8
Prothrombin Time: 31.6 seconds — ABNORMAL HIGH (ref 11.4–15.2)
Prothrombin Time: 20.7 seconds — ABNORMAL HIGH (ref 11.4–15.2)

## 2018-02-16 LAB — GLUCOSE, CAPILLARY
GLUCOSE-CAPILLARY: 113 mg/dL — AB (ref 70–99)
GLUCOSE-CAPILLARY: 147 mg/dL — AB (ref 70–99)
GLUCOSE-CAPILLARY: 137 mg/dL — AB (ref 70–99)
Glucose-Capillary: 146 mg/dL — ABNORMAL HIGH (ref 70–99)
Glucose-Capillary: 125 mg/dL — ABNORMAL HIGH (ref 70–99)

## 2018-02-16 LAB — SURGICAL PCR SCREEN
MRSA, PCR: NEGATIVE
Staphylococcus aureus: NEGATIVE

## 2018-02-16 LAB — MRSA PCR SCREENING: MRSA by PCR: NEGATIVE

## 2018-02-16 SURGERY — MASTOIDECTOMY
Anesthesia: General | Site: Ear | Laterality: Right

## 2018-02-16 SURGERY — MASTOIDECTOMY
Anesthesia: General

## 2018-02-16 MED ORDER — PHENYLEPHRINE 40 MCG/ML (10ML) SYRINGE FOR IV PUSH (FOR BLOOD PRESSURE SUPPORT)
PREFILLED_SYRINGE | INTRAVENOUS | Status: AC
  Filled 2018-02-16: qty 10

## 2018-02-16 MED ORDER — FENTANYL CITRATE (PF) 100 MCG/2ML IJ SOLN
INTRAMUSCULAR | Status: AC
  Filled 2018-02-16: qty 2

## 2018-02-16 MED ORDER — ROCURONIUM BROMIDE 50 MG/5ML IV SOSY
PREFILLED_SYRINGE | INTRAVENOUS | Status: AC
  Filled 2018-02-16: qty 5

## 2018-02-16 MED ORDER — PROPOFOL 10 MG/ML IV BOLUS
INTRAVENOUS | Status: DC | PRN
  Administered 2018-02-16: 20 mg via INTRAVENOUS
  Administered 2018-02-16: 10 mg via INTRAVENOUS
  Administered 2018-02-16: 140 mg via INTRAVENOUS

## 2018-02-16 MED ORDER — LACTATED RINGERS IV SOLN
INTRAVENOUS | Status: DC | PRN
  Administered 2018-02-16: 21:00:00 via INTRAVENOUS

## 2018-02-16 MED ORDER — CIPROFLOXACIN-DEXAMETHASONE 0.3-0.1 % OT SUSP
OTIC | Status: AC
  Filled 2018-02-16: qty 7.5

## 2018-02-16 MED ORDER — MEPERIDINE HCL 50 MG/ML IJ SOLN: 6.2500 mg | INTRAMUSCULAR | Status: DC | PRN

## 2018-02-16 MED ORDER — LIDOCAINE-EPINEPHRINE 1 %-1:100000 IJ SOLN
INTRAMUSCULAR | Status: AC
  Filled 2018-02-16: qty 1

## 2018-02-16 MED ORDER — LACTATED RINGERS IV SOLN
INTRAVENOUS | Status: DC
  Administered 2018-02-16 – 2018-02-20 (×2): via INTRAVENOUS

## 2018-02-16 MED ORDER — EPHEDRINE 5 MG/ML INJ
INTRAVENOUS | Status: AC
  Filled 2018-02-16: qty 10

## 2018-02-16 MED ORDER — LIDOCAINE 2% (20 MG/ML) 5 ML SYRINGE
INTRAMUSCULAR | Status: AC
  Filled 2018-02-16: qty 5

## 2018-02-16 MED ORDER — LIDOCAINE HCL (CARDIAC) PF 100 MG/5ML IV SOSY
PREFILLED_SYRINGE | INTRAVENOUS | Status: DC | PRN
  Administered 2018-02-16: 40 mg via INTRATRACHEAL

## 2018-02-16 MED ORDER — ONDANSETRON HCL 4 MG/2ML IJ SOLN
INTRAMUSCULAR | Status: DC | PRN
  Administered 2018-02-16: 4 mg via INTRAVENOUS

## 2018-02-16 MED ORDER — SODIUM CHLORIDE 0.9 % IR SOLN
Status: DC | PRN
  Administered 2018-02-16: 1000 mL

## 2018-02-16 MED ORDER — ROCURONIUM BROMIDE 100 MG/10ML IV SOLN
INTRAVENOUS | Status: DC | PRN
  Administered 2018-02-16: 40 mg via INTRAVENOUS
  Administered 2018-02-16: 10 mg via INTRAVENOUS

## 2018-02-16 MED ORDER — ESMOLOL HCL 100 MG/10ML IV SOLN
INTRAVENOUS | Status: DC | PRN
  Administered 2018-02-16: 20 mg via INTRAVENOUS

## 2018-02-16 MED ORDER — ONDANSETRON HCL 4 MG/2ML IJ SOLN
INTRAMUSCULAR | Status: AC
  Filled 2018-02-16: qty 2

## 2018-02-16 MED ORDER — LIDOCAINE-EPINEPHRINE 1 %-1:100000 IJ SOLN
INTRAMUSCULAR | Status: DC | PRN
  Administered 2018-02-16: 3 mL

## 2018-02-16 MED ORDER — PROPOFOL 10 MG/ML IV BOLUS
INTRAVENOUS | Status: AC
  Filled 2018-02-16: qty 20

## 2018-02-16 MED ORDER — VITAMIN K1 10 MG/ML IJ SOLN
1.0000 mg | Freq: Once | INTRAVENOUS | Status: AC
  Administered 2018-02-16: 1 mg via INTRAVENOUS
  Filled 2018-02-16: qty 0.1

## 2018-02-16 MED ORDER — EPHEDRINE SULFATE 50 MG/ML IJ SOLN
INTRAMUSCULAR | Status: DC | PRN
  Administered 2018-02-16 (×2): 5 mg via INTRAVENOUS

## 2018-02-16 MED ORDER — 0.9 % SODIUM CHLORIDE (POUR BTL) OPTIME
TOPICAL | Status: DC | PRN
  Administered 2018-02-16: 1000 mL

## 2018-02-16 MED ORDER — FENTANYL CITRATE (PF) 250 MCG/5ML IJ SOLN
INTRAMUSCULAR | Status: AC
  Filled 2018-02-16: qty 5

## 2018-02-16 MED ORDER — LACTATED RINGERS IV SOLN: INTRAVENOUS | Status: DC

## 2018-02-16 MED ORDER — SODIUM CHLORIDE 0.9 % IV SOLN
INTRAVENOUS | Status: AC
  Filled 2018-02-16: qty 500000

## 2018-02-16 MED ORDER — ARTIFICIAL TEARS OPHTHALMIC OINT
TOPICAL_OINTMENT | OPHTHALMIC | Status: AC
  Filled 2018-02-16: qty 3.5

## 2018-02-16 MED ORDER — SUGAMMADEX SODIUM 200 MG/2ML IV SOLN
INTRAVENOUS | Status: DC | PRN
  Administered 2018-02-16: 200 mg via INTRAVENOUS

## 2018-02-16 MED ORDER — FENTANYL CITRATE (PF) 250 MCG/5ML IJ SOLN
INTRAMUSCULAR | Status: DC | PRN
  Administered 2018-02-16 (×3): 50 ug via INTRAVENOUS

## 2018-02-16 MED ORDER — FENTANYL CITRATE (PF) 100 MCG/2ML IJ SOLN
25.0000 ug | INTRAMUSCULAR | Status: DC | PRN
  Administered 2018-02-16: 25 ug via INTRAVENOUS

## 2018-02-16 MED ORDER — ESMOLOL HCL 100 MG/10ML IV SOLN
INTRAVENOUS | Status: AC
  Filled 2018-02-16: qty 10

## 2018-02-16 MED ORDER — SUCCINYLCHOLINE CHLORIDE 200 MG/10ML IV SOSY
PREFILLED_SYRINGE | INTRAVENOUS | Status: AC
  Filled 2018-02-16: qty 10

## 2018-02-16 MED ORDER — PROMETHAZINE HCL 25 MG/ML IJ SOLN: 6.2500 mg | INTRAMUSCULAR | Status: DC | PRN

## 2018-02-16 MED ORDER — PHENYLEPHRINE HCL 10 MG/ML IJ SOLN
INTRAMUSCULAR | Status: DC | PRN
  Administered 2018-02-16: 40 ug via INTRAVENOUS
  Administered 2018-02-16: 80 ug via INTRAVENOUS

## 2018-02-16 MED ORDER — SUGAMMADEX SODIUM 200 MG/2ML IV SOLN
INTRAVENOUS | Status: AC
  Filled 2018-02-16: qty 2

## 2018-02-16 SURGICAL SUPPLY — 94 items
ADH SKN CLS APL DERMABOND .7 (GAUZE/BANDAGES/DRESSINGS) ×1
AIRSTRIP 4 3/4X3 1/4 7185 (GAUZE/BANDAGES/DRESSINGS) IMPLANT
APL SKNCLS STERI-STRIP NONHPOA (GAUZE/BANDAGES/DRESSINGS) ×1
ATTRACTOMAT 16X20 MAGNETIC DRP (DRAPES) IMPLANT
BAG DECANTER FOR FLEXI CONT (MISCELLANEOUS) ×1 IMPLANT
BALL CTTN LRG ABS STRL LF (GAUZE/BANDAGES/DRESSINGS) ×1
BENZOIN TINCTURE PRP APPL 2/3 (GAUZE/BANDAGES/DRESSINGS) ×2 IMPLANT
BLADE 10 SAFETY STRL DISP (BLADE) ×2 IMPLANT
BLADE CLIPPER SURG (BLADE) ×2 IMPLANT
BLADE NDL 3 SS STRL (BLADE) IMPLANT
BLADE NEEDLE 3 SS STRL (BLADE) IMPLANT
BLADE SURG 15 STRL LF DISP TIS (BLADE) IMPLANT
BLADE SURG 15 STRL SS (BLADE)
BNDG CONFORM 2 STRL LF (GAUZE/BANDAGES/DRESSINGS) IMPLANT
BNDG GAUZE ELAST 4 BULKY (GAUZE/BANDAGES/DRESSINGS) IMPLANT
BUR RND DIAMOND ELITE 4.0 (BURR) ×1 IMPLANT
BUR RND OSTEON ELITE 6.0 (BURR) ×1 IMPLANT
CANISTER SUCT 3000ML PPV (MISCELLANEOUS) ×2 IMPLANT
CATH ROBINSON RED A/P 16FR (CATHETERS) IMPLANT
CLEANER TIP ELECTROSURG 2X2 (MISCELLANEOUS) ×2 IMPLANT
CONT SPEC 4OZ CLIKSEAL STRL BL (MISCELLANEOUS) ×2 IMPLANT
CORDS BIPOLAR (ELECTRODE) IMPLANT
COTTONBALL LRG STERILE PKG (GAUZE/BANDAGES/DRESSINGS) ×2 IMPLANT
COVER SURGICAL LIGHT HANDLE (MISCELLANEOUS) ×2 IMPLANT
COVER WAND RF STERILE (DRAPES) ×2 IMPLANT
CRADLE DONUT ADULT HEAD (MISCELLANEOUS) IMPLANT
DECANTER SPIKE VIAL GLASS SM (MISCELLANEOUS) ×2 IMPLANT
DERMABOND ADVANCED (GAUZE/BANDAGES/DRESSINGS) ×1
DERMABOND ADVANCED .7 DNX12 (GAUZE/BANDAGES/DRESSINGS) ×1 IMPLANT
DRAIN PENROSE 1/4X12 LTX STRL (WOUND CARE) IMPLANT
DRAIN SNY 10 ROU (WOUND CARE) IMPLANT
DRAIN SNY 7 FPER (WOUND CARE) IMPLANT
DRAPE HALF SHEET 40X57 (DRAPES) IMPLANT
DRAPE MICROSCOPE LEICA 54X105 (DRAPE) ×2 IMPLANT
DRSG EMULSION OIL 3X3 NADH (GAUZE/BANDAGES/DRESSINGS) IMPLANT
DRSG GLASSCOCK MASTOID ADT (GAUZE/BANDAGES/DRESSINGS) ×1 IMPLANT
DRSG GLASSCOCK MASTOID PED (GAUZE/BANDAGES/DRESSINGS) IMPLANT
ELECT COATED BLADE 2.86 ST (ELECTRODE) ×2 IMPLANT
ELECT NDL TIP 2.8 STRL (NEEDLE) IMPLANT
ELECT NEEDLE TIP 2.8 STRL (NEEDLE) IMPLANT
ELECT PAIRED SUBDERMAL (MISCELLANEOUS)
ELECT REM PT RETURN 9FT ADLT (ELECTROSURGICAL) ×2
ELECTRODE PAIRED SUBDERMAL (MISCELLANEOUS) IMPLANT
ELECTRODE REM PT RTRN 9FT ADLT (ELECTROSURGICAL) ×1 IMPLANT
GAUZE 4X4 16PLY RFD (DISPOSABLE) IMPLANT
GAUZE SPONGE 4X4 12PLY STRL (GAUZE/BANDAGES/DRESSINGS) ×2 IMPLANT
GLOVE ECLIPSE 7.5 STRL STRAW (GLOVE) ×2 IMPLANT
GOWN STRL REUS W/ TWL LRG LVL3 (GOWN DISPOSABLE) ×2 IMPLANT
GOWN STRL REUS W/ TWL XL LVL3 (GOWN DISPOSABLE) ×1 IMPLANT
GOWN STRL REUS W/TWL LRG LVL3 (GOWN DISPOSABLE) ×4
GOWN STRL REUS W/TWL XL LVL3 (GOWN DISPOSABLE) ×2
KIT BASIN OR (CUSTOM PROCEDURE TRAY) ×2 IMPLANT
KIT TURNOVER KIT B (KITS) ×2 IMPLANT
NDL 18GX1X1/2 (RX/OR ONLY) (NEEDLE) IMPLANT
NDL 25GX 5/8IN NON SAFETY (NEEDLE) IMPLANT
NDL HYPO 25GX1X1/2 BEV (NEEDLE) IMPLANT
NEEDLE 18GX1X1/2 (RX/OR ONLY) (NEEDLE) ×2 IMPLANT
NEEDLE 25GX 5/8IN NON SAFETY (NEEDLE) IMPLANT
NEEDLE HYPO 25GX1X1/2 BEV (NEEDLE) ×2 IMPLANT
NS IRRIG 1000ML POUR BTL (IV SOLUTION) ×2 IMPLANT
PAD ARMBOARD 7.5X6 YLW CONV (MISCELLANEOUS) ×4 IMPLANT
PENCIL BUTTON HOLSTER BLD 10FT (ELECTRODE) ×2 IMPLANT
PENCIL FOOT CONTROL (ELECTRODE) ×2 IMPLANT
POUCH STERILIZING 3 X22 (STERILIZATION PRODUCTS) IMPLANT
PROBE NERVBE PRASS .33 (MISCELLANEOUS) IMPLANT
SPECIMEN JAR SMALL (MISCELLANEOUS) IMPLANT
SPONGE SURGIFOAM ABS GEL 12-7 (HEMOSTASIS) ×2 IMPLANT
SUT BONE WAX W31G (SUTURE) IMPLANT
SUT CHROMIC 3 0 PS 2 (SUTURE) IMPLANT
SUT CHROMIC 4 0 P 3 18 (SUTURE) ×2 IMPLANT
SUT ETHILON 4 0 PS 2 18 (SUTURE) ×2 IMPLANT
SUT ETHILON 5 0 P 3 18 (SUTURE) ×1
SUT ETHILON 5 0 PS 2 18 (SUTURE) IMPLANT
SUT NYLON ETHILON 5-0 P-3 1X18 (SUTURE) ×1 IMPLANT
SUT SILK 4 0 (SUTURE) ×2
SUT SILK 4 0 P 3 (SUTURE) IMPLANT
SUT SILK 4-0 18XBRD TIE 12 (SUTURE) ×1 IMPLANT
SUT VIC AB 4-0 PS2 27 (SUTURE) ×4 IMPLANT
SWAB COLLECTION DEVICE MRSA (MISCELLANEOUS) IMPLANT
SWAB CULTURE ESWAB REG 1ML (MISCELLANEOUS) IMPLANT
SYR 10ML LL (SYRINGE) ×1 IMPLANT
SYR 3ML LL SCALE MARK (SYRINGE) IMPLANT
SYR BULB 3OZ (MISCELLANEOUS) ×1 IMPLANT
SYR BULB IRRIGATION 50ML (SYRINGE) IMPLANT
SYR TB 1ML LUER SLIP (SYRINGE) IMPLANT
TOWEL OR 17X24 6PK STRL BLUE (TOWEL DISPOSABLE) ×2 IMPLANT
TRAY ENT MC OR (CUSTOM PROCEDURE TRAY) ×2 IMPLANT
TRAY FOLEY MTR SLVR 14FR STAT (SET/KITS/TRAYS/PACK) IMPLANT
TUBING EXTENTION W/L.L. (IV SETS) ×2 IMPLANT
TUBING IRRIGATION (MISCELLANEOUS) ×3 IMPLANT
WATER STERILE IRR 1000ML POUR (IV SOLUTION) ×2 IMPLANT
WICK EAR POPE OTO 15 (ENT DISPOSABLE) IMPLANT
WIPE INSTRUMENT VISIWIPE 73X73 (MISCELLANEOUS) ×2 IMPLANT
YANKAUER SUCT BULB TIP NO VENT (SUCTIONS) IMPLANT

## 2018-02-16 NOTE — Op Note (Signed)
DATE OF PROCEDURE: 02/16/2018  SURGEON: Leta Baptist, MD  OPERATIVE REPORT   PREOPERATIVE DIAGNOSIS:  1. Right ear malignant otitis externa 2. Right ear chronic otomastoiditis 3. Right skull base osteomyelitis 4. 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 Right ear malignant otitis externa 2. Right ear chronic otomastoiditis 3. Right skull base osteomyelitis 4. 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 Right revision mastoidectomy with drainage of abscess.  ANESTHESIA: General endotracheal tube anesthesia.  COMPLICATIONS: None.  ESTIMATED BLOOD LOSS: 159ml  INDICATION FOR PROCEDURE:   [name] [name] is a 83 y.o. male with a history of poorly controlled diabetes, malignant right otitis externa, mastoiditis, and temporal bone osteomyelitis. The patient also developed cranial nerve VII palsy. The patient was treated with right tympanomastoidectomy surgery 3 months ago. In addition, he was also admitted to the [facility] for extended broad-spectrum IV antibiotics. Despite aggressive medical and surgical treatments, the patient continued to have intermittent purulent drainage from the right mastoid cavity. One week ago, the patient developed right eye diplopia, and was found by Dr. Lorina Rabon to have right sixth nerve palsy. His MRI scan yesterday showed skull base osteomyelitis, involving the petrous apex and clivus. The right mastoid cavity was opacified with purulent drainage.  There was also new onset thrombus within the right sigmoid sinus and transverse sinus. Based on the above findings, the decision was made for patient to undergo the above-stated procedure. The risks, benefits, alternatives, and details of the procedures were discussed with the patient and wife. Questions were invited and answered. Informed consent was obtained.  DESCRIPTION OF PROCEDURE: The patient was taken to the operating room and placed supine on the  operating table. General endotracheal tube anesthesia was induced by the anesthesiologist.The patient was positioned and prepped and draped in a standard fashion for right ear surgery.   1% lidocaine with 1-100,000 epinephrine was infiltrated into the right postauricular crease.  A standard postauricular incision was made. The soft tissue covering the mastoid was carefully elevated. A large amount of inflamed granulation tissue was noted. The granulation tissue was removed and sent to the pathology department.  Using a #6 cutting bur, the pre-existing mastoid cavity was carefully enlarged. The posterior ear canal wall was lowered to the level of the tympanic rim. Purulent drainage was noted within the mastoid cavity and the inferior ear canal wall. The drainage was cultured.  The antrum was entered. No infection was noted within the middle ear space.  Attention was then focused on the meatoplasty portion of the case. A 6:00 and 12:00 incision was made. The posterior concha cartilage was removed. The posterior soft tissue flap was then sutured to the posterior aspect of the mastoid cavity. A large meatal opening was achieved.  The postauricular incision was then closed in layers with 4-0 Vicryl and Dermabond. A Glasscock dressing was applied. That concluded the procedure for the patient. The care of the patient was turned over to the anesthesiologist. The patient was awakened from anesthesia without difficulty. He was extubated and transferred to the recovery room in good condition.  OPERATIVE FINDINGS: Right ear otomastoiditis with malignant otitis externa. Purulent drainage was noted and cultured.  SPECIMEN: Right middle ear and mastoid granulation tissue.  FOLLOWUP CARE: The patient will be returned to his hospital bed. The patient will likely need long-term IV antibiotic treatment.

## 2018-02-16 NOTE — Transfer of Care (Signed)
Immediate Anesthesia Transfer of Care Note  Patient: [name] [name]  Procedure(s) Performed: REVISION MASTOIDECTOMY (Right Ear) INCISION AND DRAINAGE ABSCESS (Right Ear)  Patient Location: PACU  Anesthesia Type:General  Level of Consciousness: drowsy  Airway & Oxygen Therapy: Patient Spontanous Breathing  Post-op Assessment: Report given to RN and Post -op Vital signs reviewed and stable  Post vital signs: Reviewed and stable  Last Vitals:  Vitals Value Taken Time  BP    Temp    Pulse    Resp    SpO2      Last Pain:  Vitals:   02/16/18 1941  TempSrc: Oral  PainSc:          Complications: No apparent anesthesia complications

## 2018-02-16 NOTE — Progress Notes (Signed)
PROGRESS NOTE    CLIFORD SEQUEIRA  ZDG:[id number] DOB: 1934-07-19 DOA: 02/15/2018 PCP: [name], MD   Brief Narrative:  HPI On 02/15/2018 by Dr. Shela Leff LAITHAN CONCHAS is a 83 y.o. male with medical history significant of chronic A. fib, CLL, coronary artery disease, hypertension, hyperlipidemia, IgA nephropathy, type 2 diabetes, history of recurrent mastoiditis, prior partial excision, ongoing topical antibiotic therapy, history of right facial palsy presenting to the hospital for evaluation of abnormal MRI.  Patient has poor hearing and ongoing illness in his right ear, history was provided by wife at bedside.  Wife states that the patient has had 2 right ear surgeries since June and has had Bell's palsy since July.  For the past 1 week, he has been having double vision in his right eye, as such, he was taken to a neuro-ophthalmologist in [facility] yesterday.  Patient had an MRI performed this morning and after being informed of abnormal results is here for evaluation.  He has not been having any fevers or chills.  His appetite has been reduced for the past few months due to Bell's palsy.  Patient denied having any pain.  Patient was admitted from December 13, 2017 to December 18, 2017 for acute mastoiditis/right otitis media.  PICC line was placed and he was treated with IV antibiotics (ceftriaxone, vancomycin, metronidazole) through January 23, 2018. Assessment & Plan   Skull base osteomyelitis, epidural abscess, sigmoid sinus thrombus -Presented with leukocytosis otherwise hemodynamically stable -MRI showing increased fluid throughout the right mastoid cells now including the petrous apex with extensive surrounding enhancement consistent with petrous apicitis and skull base osteomyelitis.  No regional dural thickening most notable along the clivus and suspected small retroclival epidural abscess.  New thrombus in the right transverse and right sigmoid sinuses. -ENT, Dr. Benjamine Mola,  consulted and appreciated-planning for OR later to see evening after vitamin K has been given to reverse INR -antibiotics after sugery -Will likely consult infectious disease after biopsy or surgery results  Chronic atrial fibrillation -CHADSVASC 6  -INR therapeutic, Coumadin currently held.  As above vitamin K will be given to reverse.  Right facial palsy -Has had decreased intake since right facial palsy.  Currently undergoing speech therapy as an outpatient.  Physical Deconditioning -PT consulted and pending  Diabetes mellitus, type II -Hemoglobin A1c 9.5 in September 2019 -Continue insulin sliding scale with CBG monitoring  Chronic diastolic congestive heart failure -Currently appears to be euvolemic and compensated -Hold home Lasix -Monitor intake and output, daily weights  Essential hypertension -Continue to monitor, currently stable -Lasix held  Coronary artery disease -Currently chest pain-free  Hyperlipidemia -Continue statin  Anxiety -Continue Ativan  Chronic pain -Continue tramadol as needed  DVT Prophylaxis  Supratherapeutic INR  Code Status: Full  Family Communication: wife at bedside  Disposition Plan: Admitted. Pending surgery today.   Consultants ENT  Procedures  None  Antibiotics   Anti-infectives (From admission, onward)   Start     Dose/Rate Route Frequency Ordered Stop   02/16/18 1100  vancomycin (VANCOCIN) 1,250 mg in sodium chloride 0.9 % 250 mL IVPB     1,250 mg 166.7 mL/hr over 90 Minutes Intravenous Every 12 hours 02/15/18 2304     02/16/18 0800  ceFEPIme (MAXIPIME) 2 g in sodium chloride 0.9 % 100 mL IVPB     2 g 200 mL/hr over 30 Minutes Intravenous Every 8 hours 02/15/18 2304     02/15/18 2300  ceFEPIme (MAXIPIME) 2 g in sodium chloride 0.9 %  100 mL IVPB     2 g 200 mL/hr over 30 Minutes Intravenous  Once 02/15/18 2250 02/16/18 1001   02/15/18 2300  vancomycin (VANCOCIN) 2,000 mg in sodium chloride 0.9 % 500 mL IVPB      2,000 mg 250 mL/hr over 120 Minutes Intravenous  Once 02/15/18 2250 02/16/18 1001      Subjective:   [name] seen and examined today. No complaints today. Denies currently chest pain, shortness of breath, abdominal pain, N/V/D/C.   Objective:   Vitals:   02/15/18 2100 02/16/18 0002 02/16/18 0402 02/16/18 0803  BP: 120/69 121/66 119/65 117/62  Pulse: 90 73 85 86  Resp: 18 17 20 18   Temp: 98.8 F (37.1 C) 98.4 F (36.9 C) 98.4 F (36.9 C) (!) 97.5 F (36.4 C)  TempSrc: Oral Oral Oral Oral  SpO2: 97% 94% (!) 88% 94%  Weight:      Height:        Intake/Output Summary (Last 24 hours) at 02/16/2018 1423 Last data filed at 02/16/2018 0444 Gross per 24 hour  Intake 600 ml  Output -  Net 600 ml   Filed Weights   02/15/18 1408  Weight: 110.2 kg    Exam  General: Well developed, well nourished, NAD, appears stated age  HEENT: NCAT, Eye patch in place, mucous membranes moist.   Neck: Supple  Cardiovascular: S1 S2 auscultated, no rubs, murmurs or gallops. Regular rate and rhythm.  Respiratory: Clear to auscultation bilaterally with equal chest rise  Abdomen: Soft, nontender, nondistended, + bowel sounds  Extremities: warm dry without cyanosis clubbing or edema  Neuro: AAOx3, hard of hearing, decreased right eye movement, otherwise nonfocal  Psych: Normal affect and demeanor with intact judgement and insight   Data Reviewed: I have personally reviewed following labs and imaging studies  CBC: Recent Labs  Lab 02/15/18 1449 02/16/18 0737  WBC 20.5* 17.9*  NEUTROABS 8.4*  --   HGB 12.7* 12.1*  HCT 40.1 39.0  MCV 86.8 85.9  PLT 358 035   Basic Metabolic Panel: Recent Labs  Lab 02/15/18 1449  NA 134*  K 4.3  CL 98  CO2 27  GLUCOSE 79  BUN 28*  CREATININE 0.78  CALCIUM 10.5*   GFR: Estimated Creatinine Clearance: 92.8 mL/min (by C-G formula based on SCr of 0.78 mg/dL). Liver Function Tests: No results for input(s): AST, ALT, ALKPHOS, BILITOT,  PROT, ALBUMIN in the last 168 hours. No results for input(s): LIPASE, AMYLASE in the last 168 hours. No results for input(s): AMMONIA in the last 168 hours. Coagulation Profile: Recent Labs  Lab 02/15/18 1449 02/16/18 0428  INR 4.03* 3.11   Cardiac Enzymes: No results for input(s): CKTOTAL, CKMB, CKMBINDEX, TROPONINI in the last 168 hours. BNP (last 3 results) No results for input(s): PROBNP in the last 8760 hours. HbA1C: No results for input(s): HGBA1C in the last 72 hours. CBG: Recent Labs  Lab 02/15/18 2012 02/15/18 2228 02/16/18 0608 02/16/18 1152  GLUCAP 85 86 113* 147*   Lipid Profile: No results for input(s): CHOL, HDL, LDLCALC, TRIG, CHOLHDL, LDLDIRECT in the last 72 hours. Thyroid Function Tests: No results for input(s): TSH, T4TOTAL, FREET4, T3FREE, THYROIDAB in the last 72 hours. Anemia Panel: No results for input(s): VITAMINB12, FOLATE, FERRITIN, TIBC, IRON, RETICCTPCT in the last 72 hours. Urine analysis:    Component Value Date/Time   COLORURINE YELLOW 12/14/2017 0050   APPEARANCEUR CLEAR 12/14/2017 0050   LABSPEC 1.008 12/14/2017 0050   PHURINE 6.0 12/14/2017 0050  GLUCOSEU NEGATIVE 12/14/2017 0050   HGBUR NEGATIVE 12/14/2017 0050   BILIRUBINUR NEGATIVE 12/14/2017 0050   KETONESUR NEGATIVE 12/14/2017 0050   PROTEINUR NEGATIVE 12/14/2017 0050   NITRITE NEGATIVE 12/14/2017 0050   LEUKOCYTESUR NEGATIVE 12/14/2017 0050   Sepsis Labs: @LABRCNTIP (procalcitonin:4,lacticidven:4)  ) Recent Results (from the past 240 hour(s))  MRSA PCR Screening     Status: None   Collection Time: 02/15/18 11:36 PM  Result Value Ref Range Status   MRSA by PCR NEGATIVE NEGATIVE Final    Comment:        The GeneXpert MRSA Assay (FDA approved for NASAL specimens only), is one component of a comprehensive MRSA colonization surveillance program. It is not intended to diagnose MRSA infection nor to guide or monitor treatment for MRSA infections. Performed at [facility], [place] [address], [place], [place] [address]   Aerobic/Anaerobic Culture (surgical/deep wound)     Status: None (Preliminary result)   Collection Time: 02/16/18  1:14 AM  Result Value Ref Range Status   Specimen Description WOUND RIGHT EAR  Final   Special Requests NONE  Final   Gram Stain   Final    ABUNDANT WBC PRESENT, PREDOMINANTLY PMN NO ORGANISMS SEEN Performed at [facility], [place] [address], [place], [place] [address]    Culture PENDING  Incomplete   Report Status PENDING  Incomplete      Radiology Studies: Dg Eye Foreign Body  Result Date: 02/15/2018 CLINICAL DATA:  Metal working/exposure; clearance prior to MRI EXAM: ORBITS FOR FOREIGN BODY - 2 VIEW COMPARISON:  10/26/2017 FINDINGS: There is no evidence of metallic foreign body within the orbits. No significant bone abnormality identified. Dental hardware evident. Sinuses are clear. Cervical spondylosis noted. IMPRESSION: No evidence of metallic foreign body within the orbits. Electronically Signed   By: Jerilynn Mages.  Shick M.D.   On: 02/15/2018 08:22   Mr Jodene Nam Head Wo Contrast  Result Date: 02/15/2018 CLINICAL DATA:  Right 6th nerve palsy. Diplopia with right eye vision loss. Right-sided facial droop. History of right ear malignant otitis externa, chronic mastoiditis with abscess, and facial nerve paralysis status post canal wall down tympanomastoidectomy. EXAM: MRI HEAD WITHOUT AND WITH CONTRAST MRA HEAD WITHOUT CONTRAST TECHNIQUE: Multiplanar, multiecho pulse sequences of the brain and surrounding structures were obtained without and with intravenous contrast. Angiographic images of the head were obtained using MRA technique without contrast. CONTRAST:  10 mL Gadavist COMPARISON:  10/26/2017 brain MRI FINDINGS: MRI HEAD FINDINGS Brain: There is progressive, large volume fluid throughout the right mastoid air cells. There is new fluid in the right petrous apex with extensive surrounding enhancement which extends into the soft  tissues inferior to the skull base. Enhancement also extends into the right suboccipital scalp soft tissues. There is new abnormal dural thickening and enhancement throughout the posterior fossa on the right including along the clivus measuring up to 5 mm in thickness. Within the dural thickening along the clivus in a right paramedian location is a nonenhancing focus measuring 7 x 2 x 13 mm (series 18, image 32 and series 20, image 9). Abnormal enhancement extends into Meckel's cave on the right as well as into the posterior aspect of the right cavernous sinus with dural thickening along the medial aspect of the right middle cranial fossa. No acute infarct or intracranial hemorrhage is identified. There is moderate cerebral atrophy. No significant cerebral white matter disease is seen for age. Vascular: Major intracranial arterial flow voids are preserved. There is new loss of  the right transverse and sigmoid sinus flow void with suggestion of nonocclusive thrombus on postcontrast images. Skull and upper cervical spine: New abnormal marrow precontrast T1 hypointensity and enhancement within the right aspect of the clivus and petrous apex consistent with osteomyelitis. Sinuses/Orbits: Mild mucosal thickening in the right ethmoid, maxillary, and sphenoid sinuses. Clear left mastoid air cells. Bilateral cataract extraction. Other: None. MRA HEAD FINDINGS The visualized distal vertebral arteries are patent to the basilar and codominant. Patent PICA and SCA origins are identified bilaterally. The basilar artery is widely patent. Posterior communicating arteries are not identified and may be diminutive or absent. PCAs are patent with symmetric branch vessel attenuation but no significant proximal stenosis. The internal carotid arteries are patent without evidence of flow limiting stenosis. The right ICA appears slightly narrowed just below the skull base as it courses through the above described inflammatory process. ACAs  and MCAs are patent without evidence of significant M1 stenosis. Mildly diminished signal in the proximal M2 segments bilaterally is artifactual, however there may be superimposed mild-to-moderate left greater than right atherosclerotic irregularity and narrowing. Right A1 segment irregularity and proximal right A2 signal loss are favored to be artifactual. No aneurysm is identified. IMPRESSION: 1. Increased fluid throughout the right mastoid air cells now including the petrous apex with extensive surrounding enhancement consistent with petrous apicitis and skull base osteomyelitis. 2. New regional dural thickening most notable along the clivus with suspected small retroclival epidural abscess. 3. New thrombus in the right transverse and right sigmoid sinuses. 4. No arterial large vessel occlusion or definite flow limiting proximal stenosis. These results were called by telephone at the time of interpretation on 02/15/2018 at 12:10 pm to Dr. Michel Bickers , who verbally acknowledged these results. Electronically Signed   By: Logan Bores M.D.   On: 02/15/2018 12:14   Mr Jeri Cos FT Contrast  Result Date: 02/15/2018 CLINICAL DATA:  Right 6th nerve palsy. Diplopia with right eye vision loss. Right-sided facial droop. History of right ear malignant otitis externa, chronic mastoiditis with abscess, and facial nerve paralysis status post canal wall down tympanomastoidectomy. EXAM: MRI HEAD WITHOUT AND WITH CONTRAST MRA HEAD WITHOUT CONTRAST TECHNIQUE: Multiplanar, multiecho pulse sequences of the brain and surrounding structures were obtained without and with intravenous contrast. Angiographic images of the head were obtained using MRA technique without contrast. CONTRAST:  10 mL Gadavist COMPARISON:  10/26/2017 brain MRI FINDINGS: MRI HEAD FINDINGS Brain: There is progressive, large volume fluid throughout the right mastoid air cells. There is new fluid in the right petrous apex with extensive surrounding enhancement  which extends into the soft tissues inferior to the skull base. Enhancement also extends into the right suboccipital scalp soft tissues. There is new abnormal dural thickening and enhancement throughout the posterior fossa on the right including along the clivus measuring up to 5 mm in thickness. Within the dural thickening along the clivus in a right paramedian location is a nonenhancing focus measuring 7 x 2 x 13 mm (series 18, image 32 and series 20, image 9). Abnormal enhancement extends into Meckel's cave on the right as well as into the posterior aspect of the right cavernous sinus with dural thickening along the medial aspect of the right middle cranial fossa. No acute infarct or intracranial hemorrhage is identified. There is moderate cerebral atrophy. No significant cerebral white matter disease is seen for age. Vascular: Major intracranial arterial flow voids are preserved. There is new loss of the right transverse and sigmoid sinus flow void with  suggestion of nonocclusive thrombus on postcontrast images. Skull and upper cervical spine: New abnormal marrow precontrast T1 hypointensity and enhancement within the right aspect of the clivus and petrous apex consistent with osteomyelitis. Sinuses/Orbits: Mild mucosal thickening in the right ethmoid, maxillary, and sphenoid sinuses. Clear left mastoid air cells. Bilateral cataract extraction. Other: None. MRA HEAD FINDINGS The visualized distal vertebral arteries are patent to the basilar and codominant. Patent PICA and SCA origins are identified bilaterally. The basilar artery is widely patent. Posterior communicating arteries are not identified and may be diminutive or absent. PCAs are patent with symmetric branch vessel attenuation but no significant proximal stenosis. The internal carotid arteries are patent without evidence of flow limiting stenosis. The right ICA appears slightly narrowed just below the skull base as it courses through the above  described inflammatory process. ACAs and MCAs are patent without evidence of significant M1 stenosis. Mildly diminished signal in the proximal M2 segments bilaterally is artifactual, however there may be superimposed mild-to-moderate left greater than right atherosclerotic irregularity and narrowing. Right A1 segment irregularity and proximal right A2 signal loss are favored to be artifactual. No aneurysm is identified. IMPRESSION: 1. Increased fluid throughout the right mastoid air cells now including the petrous apex with extensive surrounding enhancement consistent with petrous apicitis and skull base osteomyelitis. 2. New regional dural thickening most notable along the clivus with suspected small retroclival epidural abscess. 3. New thrombus in the right transverse and right sigmoid sinuses. 4. No arterial large vessel occlusion or definite flow limiting proximal stenosis. These results were called by telephone at the time of interpretation on 02/15/2018 at 12:10 pm to Dr. Michel Bickers , who verbally acknowledged these results. Electronically Signed   By: Logan Bores M.D.   On: 02/15/2018 12:14     Scheduled Meds: . atorvastatin  10 mg Oral QPM  . calcium-vitamin D  2 tablet Oral Q breakfast  . cholecalciferol  1,000 Units Oral q morning - 10a  . clotrimazole  1 application Topical q morning - 10a  . digoxin  0.125 mg Oral Q breakfast  . finasteride  5 mg Oral q morning - 10a  . insulin aspart  0-9 Units Subcutaneous TID WC  . LORazepam  1 mg Oral QHS  . multivitamin with minerals  1 tablet Oral Q breakfast  . omega-3 acid ethyl esters  1 capsule Oral BID  . vitamin C  1,000 mg Oral Q breakfast  . vitamin E  400 Units Oral q morning - 10a   Continuous Infusions: . ceFEPime (MAXIPIME) IV 2 g (02/16/18 0842)  . vancomycin 1,250 mg (02/16/18 1205)     LOS: 1 day   Time Spent in minutes   30 minutes  Landrey Mahurin D.O. on 02/16/2018 at 2:23 PM  Between 7am to 7pm - Please see pager  noted on [email or web address]  After 7pm go to [email or web address]  And look for the night coverage person covering for me after hours  [facility] Office  [phone]

## 2018-02-16 NOTE — Evaluation (Signed)
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  MRI showing increased fluid in right mostoid "cells" including petrous apex consisten with petrous apicitis and skull base osteomyelitis.  pt for biopsy, initiation of abx and ?pending surgical intervention.  Clinical Impression  Pt admitted with/for problems described in the MRI above.  Presently, this pt shows mild weakness, poor balance, unsafe use of the RW and appears to make unsafe decisions with mobility.  Pt currently limited functionally due to the problems listed. ( See problems list.)   Pt will benefit from PT to maximize function and safety in order to get ready for next venue listed below.     Follow Up Recommendations SNF;Supervision/Assistance - 24 hour    Equipment Recommendations  None recommended by PT    Recommendations for Other Services       Precautions / Restrictions Precautions Precautions: Fall      Mobility  Bed Mobility Overal bed mobility: Needs Assistance Bed Mobility: Supine to Sit     Supine to sit: Min assist        Transfers Overall transfer level: Needs assistance   Transfers: Sit to/from Stand Sit to Stand: Min assist;Mod assist(mod from the lower toilet)         General transfer comment: cues for hand placement and assist to come forward.  Ambulation/Gait Ambulation/Gait assistance: Min assist Gait Distance (Feet): 60 Feet Assistive device: Rolling walker (2 wheeled) Gait Pattern/deviations: Step-through pattern   Gait velocity interpretation: 1.31 - 2.62 ft/sec, indicative of limited community ambulator General Gait Details: variably rapid steps that pt doesn't control for well.  RW kept too far in  front with pt drifting lL/R and not staying inside the [employer]            Wheelchair Mobility    Modified Rankin (Stroke Patients Only)       Balance Overall balance assessment: Needs assistance   Sitting balance-Leahy Scale: Fair       Standing balance-Leahy Scale: Fair Standing balance comment: needs external support for dynamic tasks to be safe.                             Pertinent Vitals/Pain Pain Assessment: Faces Faces Pain Scale: No hurt    Home Living Family/patient expects to be discharged to:: Private residence Living Arrangements: Spouse/significant other Available Help at Discharge: Family;Available 24 hours/day Type of Home: House Home Access: Stairs to enter Entrance Stairs-Rails: Psychiatric nurse of Steps: 2 Home Layout: One level Home Equipment: Shower seat      Prior Function Level of Independence: Needs assistance   Gait / Transfers Assistance Needed: pt has recently used a RW and wife has followed along with him.  ADL's / Homemaking Assistance Needed: Wife has sponge bathed.        Hand Dominance        Extremity/Trunk Assessment   Upper Extremity Assessment Upper Extremity Assessment: Overall WFL for tasks assessed    Lower Extremity Assessment Lower Extremity Assessment: Overall WFL for tasks assessed(bil proximal weakness)       Communication   Communication: HOH  Cognition Arousal/Alertness: Awake/alert;Lethargic Behavior During Therapy: Flat affect Overall Cognitive Status: Difficult to  assess                                        General Comments      Exercises     Assessment/Plan    PT Assessment Patient needs continued PT services  PT Problem List Decreased strength;Decreased activity tolerance;Decreased balance;Decreased mobility;Decreased knowledge of use of DME;Decreased safety awareness       PT Treatment Interventions DME instruction;Gait  training;Functional mobility training;Therapeutic activities;Balance training;Patient/family education    PT Goals (Current goals can be found in the Care Plan section)  Acute Rehab PT Goals Patient Stated Goal: wife hope to get him better, get to rehab and home. PT Goal Formulation: With patient Potential to Achieve Goals: Good    Frequency Min 3X/week   Barriers to discharge        Co-evaluation               AM-PAC PT "6 Clicks" Daily Activity  Outcome Measure Difficulty turning over in bed (including adjusting bedclothes, sheets and blankets)?: A Little Difficulty moving from lying on back to sitting on the side of the bed? : A Little Difficulty sitting down on and standing up from a chair with arms (e.g., wheelchair, bedside commode, etc,.)?: Unable Help needed moving to and from a bed to chair (including a wheelchair)?: A Little Help needed walking in hospital room?: A Little Help needed climbing 3-5 steps with a railing? : A Little 6 Click Score: 16    End of Session   Activity Tolerance: Patient tolerated treatment well;Patient limited by fatigue Patient left: in chair;with call bell/phone within reach;with chair alarm set;with family/visitor present Nurse Communication: Mobility status PT Visit Diagnosis: Unsteadiness on feet (R26.81);Other abnormalities of gait and mobility (R26.89);Other symptoms and signs involving the nervous system (R29.898)    Time: 1020-1058 PT Time Calculation (min) (ACUTE ONLY): 38 min   Charges:   PT Evaluation $PT Eval Moderate Complexity: 1 Mod PT Treatments $Gait Training: 8-22 mins $Therapeutic Activity: 8-22 mins        02/16/2018  [name], PT Acute Rehabilitation Services [phone]  (pager) [phone]  (office)  [name] 02/16/2018, 5:14 PM

## 2018-02-16 NOTE — Progress Notes (Signed)
Initial Nutrition Assessment  DOCUMENTATION CODES:   Obesity unspecified  INTERVENTION:  RD to order nutritional supplements once diet advances as appropriate.   NUTRITION DIAGNOSIS:   Increased nutrient needs related to (surgery) as evidenced by estimated needs.  GOAL:   Patient will meet greater than or equal to 90% of their needs  MONITOR:   Labs, Weight trends, I & O's, Skin, Diet advancement  REASON FOR ASSESSMENT:   Malnutrition Screening Tool    ASSESSMENT:   83 y.o. male with medical history significant of chronic A. fib, CLL, coronary artery disease, hypertension, hyperlipidemia, IgA nephropathy, type 2 diabetes, history of recurrent mastoiditis, prior partial excision, ongoing topical antibiotic therapy, history of right facial palsy presenting to the hospital for evaluation of abnormal MRI. Pt with Skull base osteomyelitis, epidural abscess, sigmoid sinus thrombus.  Pt currently NPO for drainage of mastoid abscess and irrigation today. Pt asleep during time of visit and did not awake to RD assessment. Wife at bedside reports pt with a decreased appetite since June 2019. Wife reports pt has only been wanting to consume soups over the past 3 weeks. Wife has been making homemade soups with meat, beans, and starch. Pt with usual intake of 2 soup meals a day and a Premier protein shake and greek yogurt for snack. Pt with a 17% weight loss in 7 months. RD to order nutritional supplements once diet advances as appropriate to aid in caloric and protein needs. Labs and medications reviewed.   Unable to complete Nutrition-Focused physical exam at this time. RD to perform physical exam at next visit.   Labs and medications reviewed.   Diet Order:   Diet Order            Diet NPO time specified Except for: Sips with Meds  Diet effective now              EDUCATION NEEDS:   Not appropriate for education at this time  Skin:  Skin Assessment: Reviewed RN  Assessment  Last BM:  11/12  Height:   Ht Readings from Last 1 Encounters:  02/15/18 6' (1.829 m)    Weight:   Wt Readings from Last 1 Encounters:  02/15/18 110.2 kg    Ideal Body Weight:  80.9 kg  BMI:  Body mass index is 32.96 kg/m.  Estimated Nutritional Needs:   Kcal:  1900-2100  Protein:  95-110 grams  Fluid:  1.9 - 2.1 L/day    Corrin Parker, MS, RD, LDN Pager # [phone] After hours/ weekend pager # [phone]

## 2018-02-16 NOTE — Evaluation (Signed)
SLP Cancellation Note  Patient Details Name: [name] [name] MRN: [id number] DOB: 1934/07/20   Cancelled treatment:       Reason Eval/Treat Not Completed: Other (comment)(pt npo for possible biospy, will continue efforts)   Macario Golds 02/16/2018, 7:07 AM  Luanna Salk, MS [facility] SLP Acute Rehab Services Pager [phone] Office [phone]

## 2018-02-16 NOTE — Anesthesia Procedure Notes (Signed)
Procedure Name: Intubation Date/Time: 02/16/2018 9:07 PM Performed by: Clovis Cao, CRNA Pre-anesthesia Checklist: Patient identified, Emergency Drugs available, Suction available, Patient being monitored and Timeout performed Patient Re-evaluated:Patient Re-evaluated prior to induction Oxygen Delivery Method: Circle system utilized Preoxygenation: Pre-oxygenation with 100% oxygen Induction Type: IV induction Ventilation: Mask ventilation without difficulty Laryngoscope Size: Glidescope (Elective Glidescope) Grade View: Grade I Tube type: Oral Tube size: 7.5 mm Number of attempts: 1 Airway Equipment and Method: Stylet Placement Confirmation: ETT inserted through vocal cords under direct vision,  positive ETCO2 and breath sounds checked- equal and bilateral Secured at: 22 cm Tube secured with: Tape Dental Injury: Teeth and Oropharynx as per pre-operative assessment

## 2018-02-16 NOTE — Anesthesia Preprocedure Evaluation (Addendum)
Anesthesia Evaluation  Patient identified by MRN, date of birth, ID band Patient awake    Reviewed: Allergy & Precautions, NPO status , Patient's Chart, lab work & pertinent test results  Airway Mallampati: II  TM Distance: >3 FB Neck ROM: Full    Dental  (+) Partial Upper, Dental Advisory Given   Pulmonary former smoker,    breath sounds clear to auscultation       Cardiovascular hypertension, + CAD and +CHF  + dysrhythmias Atrial Fibrillation  Rhythm:Irregular Rate:Normal     Neuro/Psych Anxiety  Neuromuscular disease    GI/Hepatic negative GI ROS,   Endo/Other  diabetes, Type 2, Oral Hypoglycemic Agents, Insulin Dependent  Renal/GU      Musculoskeletal negative musculoskeletal ROS (+)   Abdominal Normal abdominal exam  (+)   Peds  Hematology   Anesthesia Other Findings   Reproductive/Obstetrics                            Lab Results  Component Value Date   WBC 17.9 (H) 02/16/2018   HGB 12.1 (L) 02/16/2018   HCT 39.0 02/16/2018   MCV 85.9 02/16/2018   PLT 310 02/16/2018   Lab Results  Component Value Date   CREATININE 0.78 02/15/2018   BUN 28 (H) 02/15/2018   NA 134 (L) 02/15/2018   K 4.3 02/15/2018   CL 98 02/15/2018   CO2 27 02/15/2018   Lab Results  Component Value Date   INR 3.11 02/16/2018   INR 4.03 (HH) 02/15/2018   INR 2.3 02/03/2018   Echo: - Left ventricle: The cavity size was normal. Wall thickness was   increased in a pattern of mild LVH. Systolic function was normal.   The estimated ejection fraction was in the range of 60% to 65%.   Wall motion was normal; there were no regional wall motion   abnormalities. The study is not technically sufficient to allow   evaluation of LV diastolic function. - Aortic valve: Mildly calcified annulus. Mildly thickened   leaflets. There was mild stenosis. There was mild regurgitation.   Mean gradient (S): 10 mm Hg. Valve  area (VTI): 1.7 cm^2. Valve   area (Vmax): 1.74 cm^2. Valve area (Vmean): 1.71 cm^2. - Mitral valve: Mildly calcified annulus. Mildly thickened leaflets  EKG: atrial fibrillation, RBBB.   Anesthesia Physical Anesthesia Plan  ASA: III and emergent  Anesthesia Plan: General   Post-op Pain Management:    Induction: Intravenous  PONV Risk Score and Plan: 3 and Ondansetron, Dexamethasone and Treatment may vary due to age or medical condition  Airway Management Planned: Oral ETT and Video Laryngoscope Planned  Additional Equipment: None  Intra-op Plan:   Post-operative Plan: Extubation in OR  Informed Consent: I have reviewed the patients History and Physical, chart, labs and discussed the procedure including the risks, benefits and alternatives for the proposed anesthesia with the patient or authorized representative who has indicated his/her understanding and acceptance.   Dental advisory given  Plan Discussed with: CRNA  Anesthesia Plan Comments: (Discussed with surgeon, will proceed without documented updated INR due to invasion of infection.   Repeat INR 1.8)     Anesthesia Quick Evaluation

## 2018-02-16 NOTE — Progress Notes (Signed)
Patient ID: [name] [name], male   DOB: Feb 05, 1935, 83 y.o.   MRN: [id number] As of now there is no role for neurosurgery. Dr. Benjamine Mola and I spoke, he will contact me if I am needed.

## 2018-02-16 NOTE — H&P (Signed)
HPI:  [name] [name] is an 83 y.o. male well known to me. 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 The patient was treated with right tympanomastoidectomy surgery 3 months ago. In addition, he was also admitted to the [facility] for extended broad-spectrum IV antibiotics. Despite aggressive medical and surgical treatments, the patient continues to have intermittent purulent drainage from the right mastoid cavity. One week ago, the patient developed right eye diplopia, and was found by Dr. Lorina Rabon to have right sixth nerve palsy. His MRI scan yesterday showed skull base osteomyelitis, involving the petrous apex and clivus. There was also new onset thrombus within the right sigmoid sinus and transverse sinus. It should be noted the patient has a history of poorly controlled diabetes.  His blood sugars have been fluctuating anywhere from 100 to 500 over the past few months.  Past Medical History:  Diagnosis Date  . Atrial fibrillation ([place])   . Chronic lymphocytic leukemia ([place])   . Coronary atherosclerosis of native coronary artery    Nonobstructive  . Essential hypertension, benign   . Hyperlipidemia   . IgA nephropathy    Dr. Lowanda Foster  . Type 2 diabetes mellitus ([place])   . UTI (urinary tract infection) july  10 th   taking  med     Past Surgical History:  Procedure Laterality Date  . AMPUTATION Left 09/15/2012   Procedure: PARTIAL AMPUTATION 3rd TOE LEFT FOOT;  Surgeon: Marcheta Grammes, DPM;  Location: [facility];  Service: Orthopedics;  Laterality: Left;  . AMPUTATION Left 04/20/2013   Procedure: PARTIAL AMPUTATION 2ND TOE LEFT FOOT;  Surgeon: Marcheta Grammes, DPM;  Location: [facility];  Service: Orthopedics;  Laterality: Left;  . ANAL FISSURE REPAIR    . APPLICATION OF WOUND VAC Right 09/09/2016   Procedure: APPLICATION OF WOUND VAC;  Surgeon: Caprice Beaver, DPM;  Location:  [facility];  Service: Podiatry;  Laterality: Right;  . CATARACT EXTRACTION W/PHACO Right 09/30/2015   Procedure: CATARACT EXTRACTION PHACO AND INTRAOCULAR LENS PLACEMENT (IOC);  Surgeon: Tonny Branch, MD;  Location: [facility];  Service: Ophthalmology;  Laterality: Right;  CDE: 11.78  . CATARACT EXTRACTION W/PHACO Left 10/31/2015   Procedure: CATARACT EXTRACTION PHACO AND INTRAOCULAR LENS PLACEMENT LEFT EYE; CDE:  9.10;  Surgeon: Tonny Branch, MD;  Location: [facility];  Service: Ophthalmology;  Laterality: Left;  . COLONOSCOPY  08/19/2011   Procedure: COLONOSCOPY;  Surgeon: Rogene Houston, MD;  Location: [facility];  Service: Endoscopy;  Laterality: N/A;  930  . COLONOSCOPY N/A 09/19/2014   Procedure: COLONOSCOPY;  Surgeon: Rogene Houston, MD;  [facility];  Service: Endoscopy;  Laterality: N/A;  1055  . FEMORAL HERNIA REPAIR    . INCISION AND DRAINAGE Right 09/09/2016   Procedure: DEBRIDEMENT WOUND RT heel with wound vac attachment;  Surgeon: Caprice Beaver, DPM;  Location: [facility];  Service: Podiatry;  Laterality: Right;  . INCISION AND DRAINAGE OF WOUND Right 08/12/2016   Procedure: DEBRIDEMENT WOUND RIGHT HEEL;  Surgeon: Caprice Beaver, DPM;  Location: [facility];  Service: Podiatry;  Laterality: Right;  right heel  . MASS EXCISION Right 09/28/2017   Procedure: EXCISION OF EXTERNAL AUDITORY CANAL MASS;  Surgeon: Leta Baptist, MD;  Location: [facility];  Service: ENT;  Laterality: Right;  . Open repair left quadriceps tendon.  08/22/07   Dr. Aline Brochure  . Right total knee replacement    .  TRANSURETHRAL RESECTION OF PROSTATE    . TYMPANOMASTOIDECTOMY Right 11/10/2017   Procedure: RIGHT TYMPANOMASTOIDECTOMY;  Surgeon: Leta Baptist, MD;  Location: [facility];  Service: ENT;  Laterality: Right;  . WOUND EXPLORATION Right 08/12/2016   Procedure: EXPLORATION OF WOUND FOR FOREIGN BODY RIGHT HEEL;  Surgeon: Caprice Beaver, DPM;  Location: [facility];  Service: Podiatry;  Laterality:  Right;  right heel    Family History  Problem Relation Age of Onset  . Atrial fibrillation Mother     Social History:  reports that he quit smoking about 54 years ago. His smoking use included cigarettes. He quit after 10.00 years of use. He has never used smokeless tobacco. He reports that he does not drink alcohol or use drugs.  Allergies:  Allergies  Allergen Reactions  . Erythromycin Other (See Comments)  . Penicillins Hives and Other (See Comments)    Has patient had a PCN reaction causing immediate rash, facial/tongue/throat swelling, SOB or lightheadedness with hypotension: No Has patient had a PCN reaction causing severe rash involving mucus membranes or skin necrosis: No Has patient had a PCN reaction that required hospitalization No Has patient had a PCN reaction occurring within the last 10 years: No If all of the above answers are "NO", then may proceed with Cephalosporin use.  Has tolerated Rocephin    Prior to Admission medications   Medication Sig Start Date End Date Taking? Authorizing Provider  digoxin (LANOXIN) 0.125 MG tablet Take 0.125 mg by mouth daily with breakfast.    Yes [provider]  finasteride (PROSCAR) 5 MG tablet Take 5 mg by mouth every morning.    Yes [provider]  furosemide (LASIX) 20 MG tablet Take  20 mg by mouth on Sun., Tues., Thurs., Sat.,  Take 40 mg on Mon.Wed., Fri.   Yes [provider]  insulin aspart (NOVOLOG FLEXPEN) 100 UNIT/ML FlexPen Inject 0-4 Units into the skin 2 (two) times daily. Give per sliding  From 0-4 units twice a day    Yes [provider]  Insulin Detemir (LEVEMIR FLEXTOUCH) 100 UNIT/ML Pen Inject 60 Units into the skin daily.    Yes [provider]  VICTOZA 18 MG/3ML SOPN Inject 1.8 mg into the skin daily with breakfast. 06/05/16  Yes [provider]  Ascorbic Acid (VITAMIN C) 1000 MG tablet Take 1,000 mg by mouth daily with breakfast.     [provider]   atorvastatin (LIPITOR) 10 MG tablet Take 10 mg by mouth every evening.    [provider]  Calcium Carb-Cholecalciferol (CALCIUM 600+D) 600-800 MG-UNIT TABS Take 2 tablets by mouth daily with breakfast.    [provider]  cholecalciferol (VITAMIN D) 1000 units tablet Take 1,000 Units by mouth every morning.    [provider]  ciclopirox (LOPROX) 0.77 % cream Apply 1 application topically every morning. Applied to feet in the morning    [provider]  Coenzyme Q10 (CO Q-10) 100 MG CAPS Take 100 mg by mouth daily with breakfast.     [provider]  lisinopril (PRINIVIL,ZESTRIL) 20 MG tablet Take 40 mg by mouth daily with supper. 07/07/16   [provider]  LORazepam (ATIVAN) 1 MG tablet Take 1 tablet (1 mg total) by mouth at bedtime. 01/18/18   Wille Celeste, PA-C  Multiple Vitamin (MULTIVITAMIN WITH MINERALS) TABS tablet Take 1 tablet by mouth daily with breakfast.    [provider]  nystatin (MYCOSTATIN) 100000 UNIT/ML suspension Take 5 mLs  by mouth 3 (three) times daily. Take for 7 days stop date 01/24/2018    [provider]  Omega-3 Fatty Acids (FISH OIL) 1200 MG CAPS Take 2 capsules by mouth 2 (two) times daily.    [provider]  pioglitazone (ACTOS) 15 MG tablet Take 15 mg by mouth every evening.     [provider]  potassium chloride SA (K-DUR,KLOR-CON) 20 MEQ tablet Take 40 mEq by mouth daily. Take starting 01/07/2018     [provider]  traMADol (ULTRAM) 50 MG tablet Take 1 tablet (50 mg total) by mouth every 6 (six) hours as needed for moderate pain. 12/20/17   [place]anville Lewis C, PA-C  vitamin E 400 UNIT capsule Take 400 Units by mouth every morning.    [provider]  warfarin (COUMADIN) 5 MG tablet Take 1 tablet daily or as directed 02/03/18   Satira Sark, MD    Medications:  I have reviewed the patient's current medications. Scheduled: . atorvastatin  10 mg Oral  QPM  . calcium-vitamin D  2 tablet Oral Q breakfast  . cholecalciferol  1,000 Units Oral q morning - 10a  . clotrimazole  1 application Topical q morning - 10a  . digoxin  0.125 mg Oral Q breakfast  . finasteride  5 mg Oral q morning - 10a  . insulin aspart  0-9 Units Subcutaneous TID WC  . LORazepam  1 mg Oral QHS  . multivitamin with minerals  1 tablet Oral Q breakfast  . omega-3 acid ethyl esters  1 capsule Oral BID  . vitamin C  1,000 mg Oral Q breakfast  . vitamin E  400 Units Oral q morning - 10a   Continuous: . ceFEPime (MAXIPIME) IV 2 g (02/16/18 0842)  . vancomycin 1,250 mg (02/16/18 1205)    Results for orders placed or performed during the hospital encounter of 02/15/18 (from the past 48 hour(s))  Basic metabolic panel     Status: Abnormal   Collection Time: 02/15/18  2:49 PM  Result Value Ref Range   Sodium 134 (L) 135 - 145 mmol/L   Potassium 4.3 3.5 - 5.1 mmol/L   Chloride 98 98 - 111 mmol/L   CO2 27 22 - 32 mmol/L   Glucose, Bld 79 70 - 99 mg/dL   BUN 28 (H) 8 - 23 mg/dL   Creatinine, Ser 0.78 0.61 - 1.24 mg/dL   Calcium 10.5 (H) 8.9 - 10.3 mg/dL   GFR calc non Af Amer >60 >60 mL/min   GFR calc Af Amer >60 >60 mL/min    Comment: (NOTE) The eGFR has been calculated using the CKD EPI equation. This calculation has not been validated in all clinical situations. eGFR's persistently <60 mL/min signify possible Chronic Kidney Disease.    Anion gap 9 5 - 15    Comment: Performed at [facility], [address], [place], [place] [address]  CBC with Differential     Status: Abnormal   Collection Time: 02/15/18  2:49 PM  Result Value Ref Range   WBC 20.5 (H) 4.0 - 10.5 K/uL   RBC 4.62 4.22 - 5.81 MIL/uL   Hemoglobin 12.7 (L) 13.0 - 17.0 g/dL   HCT 40.1 39.0 - 52.0 %   MCV 86.8 80.0 - 100.0 fL   MCH 27.5 26.0 - 34.0 pg   MCHC 31.7 30.0 - 36.0 g/dL   RDW 14.8 11.5 - 15.5 %   Platelets 358 150 - 400 K/uL   nRBC 0.0 0.0 -  0.2 %   Neutrophils Relative % 41 %    Neutro Abs 8.4 (H) 1.7 - 7.7 K/uL   Lymphocytes Relative 51 %   Lymphs Abs 10.2 (H) 0.7 - 4.0 K/uL   Monocytes Relative 7 %   Monocytes Absolute 1.5 (H) 0.1 - 1.0 K/uL   Eosinophils Relative 1 %   Eosinophils Absolute 0.3 0.0 - 0.5 K/uL   Basophils Relative 0 %   Basophils Absolute 0.1 0.0 - 0.1 K/uL   WBC Morphology WHITE CELLS CONFIRMED BY SMEAR    Immature Granulocytes 0 %   Abs Immature Granulocytes 0.08 (H) 0.00 - 0.07 K/uL   Reactive, Benign Lymphocytes PRESENT     Comment: Performed at [facility], [address], [place], [place] [address]  Brain natriuretic peptide     Status: None   Collection Time: 02/15/18  2:49 PM  Result Value Ref Range   B Natriuretic Peptide 83.0 0.0 - 100.0 pg/mL    Comment: Performed at [facility], [address] [place], [place] [address]  Protime-INR     Status: Abnormal   Collection Time: 02/15/18  2:49 PM  Result Value Ref Range   Prothrombin Time 38.6 (H) 11.4 - 15.2 seconds   INR 4.03 (HH)     Comment: REPEATED TO VERIFY CRITICAL RESULT CALLED TO, READ BACK BY AND VERIFIED WITH: HEATHER CRAWFORD AT 1525 HF 02/15/18 Performed at [facility], [address] [place], [place] [address]   CBG monitoring, ED     Status: None   Collection Time: 02/15/18  8:12 PM  Result Value Ref Range   Glucose-Capillary 85 70 - 99 mg/dL  Glucose, capillary     Status: None   Collection Time: 02/15/18 10:28 PM  Result Value Ref Range   Glucose-Capillary 86 70 - 99 mg/dL   Comment 1 Notify RN    Comment 2 Document in Chart   MRSA PCR Screening     Status: None   Collection Time: 02/15/18 11:36 PM  Result Value Ref Range   MRSA by PCR NEGATIVE NEGATIVE    Comment:        The GeneXpert MRSA Assay (FDA approved for NASAL specimens only), is one component of a comprehensive MRSA colonization surveillance program. It is not intended to diagnose MRSA infection nor to guide or monitor treatment for MRSA infections. Performed at [place], [place] [address], [place], [place] [address]   Aerobic/Anaerobic Culture (surgical/deep wound)     Status: None (Preliminary result)   Collection Time: 02/16/18  1:14 AM  Result Value Ref Range   Specimen Description WOUND RIGHT EAR    Special Requests NONE    Gram Stain      ABUNDANT WBC PRESENT, PREDOMINANTLY PMN NO ORGANISMS SEEN Performed at [facility], [place] [address], [place], [place] [address]    Culture PENDING    Report Status PENDING   Protime-INR     Status: Abnormal   Collection Time: 02/16/18  4:28 AM  Result Value Ref Range   Prothrombin Time 31.6 (H) 11.4 - 15.2 seconds   INR 3.11     Comment: Performed at [facility] [address], [place], [place] [address]  Glucose, capillary     Status: Abnormal   Collection Time: 02/16/18  6:08 AM  Result Value Ref Range   Glucose-Capillary 113 (H) 70 - 99 mg/dL  CBC     Status: Abnormal   Collection Time: 02/16/18  7:37 AM  Result Value Ref Range   WBC 17.9 (H) 4.0 - 10.5 K/uL   RBC 4.54 4.22 - 5.81 MIL/uL   Hemoglobin 12.1 (L) 13.0 - 17.0 g/dL   HCT 39.0 39.0 - 52.0 %   MCV 85.9 80.0 - 100.0 fL   MCH 26.7 26.0 - 34.0 pg   MCHC 31.0 30.0 - 36.0 g/dL   RDW 14.6 11.5 - 15.5 %   Platelets 310 150 - 400 K/uL   nRBC 0.0 0.0 - 0.2 %    Comment: Performed at [facility], [place] [address] [place], [place] [address]    Dg Eye Foreign Body  Result Date: 02/15/2018 CLINICAL DATA:  Metal working/exposure; clearance prior to MRI EXAM: ORBITS FOR FOREIGN BODY - 2 VIEW COMPARISON:  10/26/2017 FINDINGS: There is no evidence of metallic foreign body within the orbits. No significant bone abnormality identified. Dental hardware evident. Sinuses are clear. Cervical spondylosis noted. IMPRESSION: No evidence of metallic foreign body within the orbits. Electronically Signed   By: Jerilynn Mages.  Shick M.D.   On: 02/15/2018 08:22   Mr Jodene Nam Head Wo Contrast  Result Date: 02/15/2018 CLINICAL DATA:  Right 6th nerve palsy. Diplopia  with right eye vision loss. Right-sided facial droop. History of right ear malignant otitis externa, chronic mastoiditis with abscess, and facial nerve paralysis status post canal wall down tympanomastoidectomy. EXAM: MRI HEAD WITHOUT AND WITH CONTRAST MRA HEAD WITHOUT CONTRAST TECHNIQUE: Multiplanar, multiecho pulse sequences of the brain and surrounding structures were obtained without and with intravenous contrast. Angiographic images of the head were obtained using MRA technique without contrast. CONTRAST:  10 mL Gadavist COMPARISON:  10/26/2017 brain MRI FINDINGS: MRI HEAD FINDINGS Brain: There is progressive, large volume fluid throughout the right mastoid air cells. There is new fluid in the right petrous apex with extensive surrounding enhancement which extends into the soft tissues inferior to the skull base. Enhancement also extends into the right suboccipital scalp soft tissues. There is new abnormal dural thickening and enhancement throughout the posterior fossa on the right including along the clivus measuring up to 5 mm in thickness. Within the dural thickening along the clivus in a right paramedian location is a nonenhancing focus measuring 7 x 2 x 13 mm (series 18, image 32 and series 20, image 9). Abnormal enhancement extends into Meckel's cave on the right as well as into the posterior aspect of the right cavernous sinus with dural thickening along the medial aspect of the right middle cranial fossa. No acute infarct or intracranial hemorrhage is identified. There is moderate cerebral atrophy. No significant cerebral white matter disease is seen for age. Vascular: Major intracranial arterial flow voids are preserved. There is new loss of the right transverse and sigmoid sinus flow void with suggestion of nonocclusive thrombus on postcontrast images. Skull and upper cervical spine: New abnormal marrow precontrast T1 hypointensity and enhancement within the right aspect of the clivus and petrous apex  consistent with osteomyelitis. Sinuses/Orbits: Mild mucosal thickening in the right ethmoid, maxillary, and sphenoid sinuses. Clear left mastoid air cells. Bilateral cataract extraction. Other: None. MRA HEAD FINDINGS The visualized distal vertebral arteries are patent to the basilar and codominant. Patent PICA and SCA origins are identified bilaterally. The basilar artery is widely patent. Posterior communicating arteries are not identified and may be diminutive or absent. PCAs are patent with symmetric branch vessel attenuation but no significant proximal stenosis. The internal carotid arteries are patent without evidence of flow limiting stenosis. The right ICA appears slightly narrowed  just below the skull base as it courses through the above described inflammatory process. ACAs and MCAs are patent without evidence of significant M1 stenosis. Mildly diminished signal in the proximal M2 segments bilaterally is artifactual, however there may be superimposed mild-to-moderate left greater than right atherosclerotic irregularity and narrowing. Right A1 segment irregularity and proximal right A2 signal loss are favored to be artifactual. No aneurysm is identified. IMPRESSION: 1. Increased fluid throughout the right mastoid air cells now including the petrous apex with extensive surrounding enhancement consistent with petrous apicitis and skull base osteomyelitis. 2. New regional dural thickening most notable along the clivus with suspected small retroclival epidural abscess. 3. New thrombus in the right transverse and right sigmoid sinuses. 4. No arterial large vessel occlusion or definite flow limiting proximal stenosis. These results were called by telephone at the time of interpretation on 02/15/2018 at 12:10 pm to Dr. Michel Bickers , who verbally acknowledged these results. Electronically Signed   By: Logan Bores M.D.   On: 02/15/2018 12:14   Mr Jeri Cos SR Contrast  Result Date: 02/15/2018 CLINICAL DATA:   Right 6th nerve palsy. Diplopia with right eye vision loss. Right-sided facial droop. History of right ear malignant otitis externa, chronic mastoiditis with abscess, and facial nerve paralysis status post canal wall down tympanomastoidectomy. EXAM: MRI HEAD WITHOUT AND WITH CONTRAST MRA HEAD WITHOUT CONTRAST TECHNIQUE: Multiplanar, multiecho pulse sequences of the brain and surrounding structures were obtained without and with intravenous contrast. Angiographic images of the head were obtained using MRA technique without contrast. CONTRAST:  10 mL Gadavist COMPARISON:  10/26/2017 brain MRI FINDINGS: MRI HEAD FINDINGS Brain: There is progressive, large volume fluid throughout the right mastoid air cells. There is new fluid in the right petrous apex with extensive surrounding enhancement which extends into the soft tissues inferior to the skull base. Enhancement also extends into the right suboccipital scalp soft tissues. There is new abnormal dural thickening and enhancement throughout the posterior fossa on the right including along the clivus measuring up to 5 mm in thickness. Within the dural thickening along the clivus in a right paramedian location is a nonenhancing focus measuring 7 x 2 x 13 mm (series 18, image 32 and series 20, image 9). Abnormal enhancement extends into Meckel's cave on the right as well as into the posterior aspect of the right cavernous sinus with dural thickening along the medial aspect of the right middle cranial fossa. No acute infarct or intracranial hemorrhage is identified. There is moderate cerebral atrophy. No significant cerebral white matter disease is seen for age. Vascular: Major intracranial arterial flow voids are preserved. There is new loss of the right transverse and sigmoid sinus flow void with suggestion of nonocclusive thrombus on postcontrast images. Skull and upper cervical spine: New abnormal marrow precontrast T1 hypointensity and enhancement within the right  aspect of the clivus and petrous apex consistent with osteomyelitis. Sinuses/Orbits: Mild mucosal thickening in the right ethmoid, maxillary, and sphenoid sinuses. Clear left mastoid air cells. Bilateral cataract extraction. Other: None. MRA HEAD FINDINGS The visualized distal vertebral arteries are patent to the basilar and codominant. Patent PICA and SCA origins are identified bilaterally. The basilar artery is widely patent. Posterior communicating arteries are not identified and may be diminutive or absent. PCAs are patent with symmetric branch vessel attenuation but no significant proximal stenosis. The internal carotid arteries are patent without evidence of flow limiting stenosis. The right ICA appears slightly narrowed just below the skull base as it courses through  the above described inflammatory process. ACAs and MCAs are patent without evidence of significant M1 stenosis. Mildly diminished signal in the proximal M2 segments bilaterally is artifactual, however there may be superimposed mild-to-moderate left greater than right atherosclerotic irregularity and narrowing. Right A1 segment irregularity and proximal right A2 signal loss are favored to be artifactual. No aneurysm is identified. IMPRESSION: 1. Increased fluid throughout the right mastoid air cells now including the petrous apex with extensive surrounding enhancement consistent with petrous apicitis and skull base osteomyelitis. 2. New regional dural thickening most notable along the clivus with suspected small retroclival epidural abscess. 3. New thrombus in the right transverse and right sigmoid sinuses. 4. No arterial large vessel occlusion or definite flow limiting proximal stenosis. These results were called by telephone at the time of interpretation on 02/15/2018 at 12:10 pm to Dr. Michel Bickers , who verbally acknowledged these results. Electronically Signed   By: Logan Bores M.D.   On: 02/15/2018 12:14   Review of Systems   Constitutional:       Per HPI, otherwise negative  HENT:       Per HPI, otherwise negative  Eyes: Positive for visual disturbance. Negative for pain.  Respiratory:       Per HPI, otherwise negative  Cardiovascular:       Per HPI, otherwise negative  Gastrointestinal: Negative for vomiting.  Endocrine:       Negative aside from HPI  Genitourinary:       Neg aside from HPI   Musculoskeletal:       Per HPI, otherwise negative  Skin: Negative.   Neurological: Positive for weakness. Negative for syncope.   Blood pressure 117/62, pulse 86, temperature (!) 97.5 F (36.4 C), temperature source Oral, resp. rate 18, height 6' (1.829 m), weight 110.2 kg, SpO2 94 %. Physical Exam  Constitutional: He is alert and responsive.  No distress.  Head: Normocephalic and atraumatic.  Right Ear: There is drainage from the mastoid bowl. No external mastoid tenderness. Left Ear: Normal auricle and EAC. No drainage. Eyes: Right conjunctiva is injected. Right eye exhibits abnormal extraocular motion with 6th nerve palsy. Right pupil is reactive. Left pupil is reactive.  Nose: Normal mucosa, septum, and turbinates. Cardiovascular: Normal rate and regular rhythm.  Pulmonary/Chest: Effort normal. No stridor. No respiratory distress.  Neurological: Right cranial nerve 6 and 7 palsy. Skin: Skin is warm and dry.  Nursing note and vitals reviewed.  Assessment/Plan: Worsening of the patient's right mastoiditis and temporal bone osteomyelitis, now with involvement of his petrous apex and clivus. He also has thrombus in the right transverse and sigmoid sinus. The patient's infection has progressed despite recent treatment with 6 weeks of IV antibiotics. The right ear drainage was cultured yesterday. Now on Vanc and cefepime.  - Plan to take the patient to the OR today for drainage of his mastoid abscess and irrigation.  - Continue IV abx per ID physician. - Will likely need to repeat MRI scan in a few days to  determine pt's response to treatment. - May restart anticoagulation after his OR trip today.  Dieter Hane W Maleki Hippe 02/16/2018, 11:25 AM

## 2018-02-17 ENCOUNTER — Encounter (HOSPITAL_COMMUNITY): Payer: Self-pay | Admitting: Otolaryngology

## 2018-02-17 DIAGNOSIS — H6021 Malignant otitis externa, right ear: Principal | ICD-10-CM

## 2018-02-17 DIAGNOSIS — E785 Hyperlipidemia, unspecified: Secondary | ICD-10-CM

## 2018-02-17 DIAGNOSIS — Z9889 Other specified postprocedural states: Secondary | ICD-10-CM

## 2018-02-17 DIAGNOSIS — Z880 Allergy status to penicillin: Secondary | ICD-10-CM

## 2018-02-17 DIAGNOSIS — N028 Recurrent and persistent hematuria with other morphologic changes: Secondary | ICD-10-CM

## 2018-02-17 DIAGNOSIS — Z87891 Personal history of nicotine dependence: Secondary | ICD-10-CM

## 2018-02-17 DIAGNOSIS — I10 Essential (primary) hypertension: Secondary | ICD-10-CM

## 2018-02-17 DIAGNOSIS — Z881 Allergy status to other antibiotic agents status: Secondary | ICD-10-CM

## 2018-02-17 DIAGNOSIS — C9110 Chronic lymphocytic leukemia of B-cell type not having achieved remission: Secondary | ICD-10-CM

## 2018-02-17 DIAGNOSIS — I482 Chronic atrial fibrillation, unspecified: Secondary | ICD-10-CM

## 2018-02-17 DIAGNOSIS — E119 Type 2 diabetes mellitus without complications: Secondary | ICD-10-CM

## 2018-02-17 DIAGNOSIS — M869 Osteomyelitis, unspecified: Secondary | ICD-10-CM

## 2018-02-17 DIAGNOSIS — Z8669 Personal history of other diseases of the nervous system and sense organs: Secondary | ICD-10-CM

## 2018-02-17 DIAGNOSIS — G510 Bell's palsy: Secondary | ICD-10-CM

## 2018-02-17 LAB — BASIC METABOLIC PANEL
ANION GAP: 13 (ref 5–15)
BUN: 21 mg/dL (ref 8–23)
CHLORIDE: 98 mmol/L (ref 98–111)
CO2: 24 mmol/L (ref 22–32)
Calcium: 9.2 mg/dL (ref 8.9–10.3)
Creatinine, Ser: 0.93 mg/dL (ref 0.61–1.24)
GFR calc non Af Amer: >60 mL/min (ref >60)
GLUCOSE: 168 mg/dL — AB (ref 70–99)
POTASSIUM: 4.2 mmol/L (ref 3.5–5.1)
Sodium: 135 mmol/L (ref 135–145)

## 2018-02-17 LAB — CBC
HEMATOCRIT: 38.8 % — AB (ref 39.0–52.0)
HEMOGLOBIN: 12.3 g/dL — AB (ref 13.0–17.0)
MCH: 27.3 pg (ref 26.0–34.0)
MCHC: 31.7 g/dL (ref 30.0–36.0)
MCV: 86.2 fL (ref 80.0–100.0)
NRBC: 0 % (ref 0.0–0.2)
Platelets: 301 10*3/uL (ref 150–400)
RBC: 4.5 MIL/uL (ref 4.22–5.81)
RDW: 14.8 % (ref 11.5–15.5)
WBC: 17 10*3/uL — AB (ref 4.0–10.5)

## 2018-02-17 LAB — GLUCOSE, CAPILLARY
GLUCOSE-CAPILLARY: 254 mg/dL — AB (ref 70–99)
Glucose-Capillary: 152 mg/dL — ABNORMAL HIGH (ref 70–99)
Glucose-Capillary: 245 mg/dL — ABNORMAL HIGH (ref 70–99)
Glucose-Capillary: 247 mg/dL — ABNORMAL HIGH (ref 70–99)

## 2018-02-17 LAB — PROTIME-INR
INR: 1.5
Prothrombin Time: 17.9 seconds — ABNORMAL HIGH (ref 11.4–15.2)

## 2018-02-17 LAB — HEPARIN LEVEL (UNFRACTIONATED): HEPARIN UNFRACTIONATED: 0.26 [IU]/mL — AB (ref 0.30–0.70)

## 2018-02-17 MED ORDER — WARFARIN SODIUM 5 MG PO TABS
5.0000 mg | ORAL_TABLET | Freq: Once | ORAL | Status: AC
  Administered 2018-02-17: 5 mg via ORAL
  Filled 2018-02-17: qty 1

## 2018-02-17 MED ORDER — HEPARIN BOLUS VIA INFUSION
4000.0000 [IU] | Freq: Once | INTRAVENOUS | Status: AC
  Administered 2018-02-17: 4000 [IU] via INTRAVENOUS
  Filled 2018-02-17: qty 4000

## 2018-02-17 MED ORDER — SODIUM CHLORIDE 0.9 % IV SOLN
2.0000 g | Freq: Three times a day (TID) | INTRAVENOUS | Status: DC
  Administered 2018-02-17 – 2018-02-21 (×13): 2 g via INTRAVENOUS
  Filled 2018-02-17 (×13): qty 2
  Filled 2018-02-17: qty 1
  Filled 2018-02-17: qty 2

## 2018-02-17 MED ORDER — HEPARIN BOLUS VIA INFUSION
1000.0000 [IU] | Freq: Once | INTRAVENOUS | Status: AC
  Administered 2018-02-17: 1000 [IU] via INTRAVENOUS
  Filled 2018-02-17: qty 1000

## 2018-02-17 MED ORDER — HEPARIN (PORCINE) 25000 UT/250ML-% IV SOLN
1400.0000 [IU]/h | INTRAVENOUS | Status: DC
  Administered 2018-02-17: 1400 [IU]/h via INTRAVENOUS
  Filled 2018-02-17: qty 250

## 2018-02-17 MED ORDER — HEPARIN (PORCINE) 25000 UT/250ML-% IV SOLN
1600.0000 [IU]/h | INTRAVENOUS | Status: DC
  Administered 2018-02-18 (×2): 1600 [IU]/h via INTRAVENOUS
  Filled 2018-02-17 (×2): qty 250

## 2018-02-17 MED ORDER — WARFARIN - PHARMACIST DOSING INPATIENT
Freq: Every day | Status: DC
  Administered 2018-02-18 – 2018-02-20 (×2)

## 2018-02-17 NOTE — Progress Notes (Signed)
PROGRESS NOTE    [name] [name]  AOZ:[id number] DOB: Feb 06, 1935 DOA: 02/15/2018 PCP: [name], MD   Brief Narrative:  HPI On 02/15/2018 by Dr. Shela Leff [name] [name] is a 83 y.o. male with medical history significant of chronic A. fib, CLL, coronary artery disease, hypertension, hyperlipidemia, IgA nephropathy, type 2 diabetes, history of recurrent mastoiditis, prior partial excision, ongoing topical antibiotic therapy, history of right facial palsy presenting to the hospital for evaluation of abnormal MRI.  Patient has poor hearing and ongoing illness in his right ear, history was provided by wife at bedside.  Wife states that the patient has had 2 right ear surgeries since June and has had Bell's palsy since July.  For the past 1 week, he has been having double vision in his right eye, as such, he was taken to a neuro-ophthalmologist in [facility] yesterday.  Patient had an MRI performed this morning and after being informed of abnormal results is here for evaluation.  He has not been having any fevers or chills.  His appetite has been reduced for the past few months due to Bell's palsy.  Patient denied having any pain.  Patient was admitted from December 13, 2017 to December 18, 2017 for acute mastoiditis/right otitis media.  PICC line was placed and he was treated with IV antibiotics (ceftriaxone, vancomycin, metronidazole) through January 23, 2018. Assessment & Plan   Skull base osteomyelitis, epidural abscess, sigmoid sinus thrombus -Presented with leukocytosis otherwise hemodynamically stable -MRI showing increased fluid throughout the right mastoid cells now including the petrous apex with extensive surrounding enhancement consistent with petrous apicitis and skull base osteomyelitis.  No regional dural thickening most notable along the clivus and suspected small retroclival epidural abscess.  New thrombus in the right transverse and right sigmoid sinuses. -ENT, Dr. Benjamine Mola,  consulted and appreciated, status post right revision mastoidectomy with drainage of abscess -Neurosurgery also consulted however no role for neurosurgery at this time, Dr. Christella Noa has spoken with Dr. Benjamine Mola -Infectious disease consulted and appreciated -Antibiotics, vancomycin and cefepime, started after surgery  Chronic atrial fibrillation -CHADSVASC 6  -INR was supratherapeutic on admission, patient was given vitamin K for reversal for surgery -INR currently 1.5 -will discuss with ENT on when to restart coumadin  Right facial palsy -Has had decreased intake since right facial palsy.  Currently undergoing speech therapy as an outpatient.  Physical Deconditioning -PT evaluated patient and recommended SNF -Social work consulted   Diabetes mellitus, type II -Hemoglobin A1c 9.5 in September 2019 -Continue insulin sliding scale with CBG monitoring  Chronic diastolic congestive heart failure -Currently appears to be euvolemic and compensated -Hold home Lasix -Monitor intake and output, daily weights  Essential hypertension -Continue to monitor, currently stable -Lasix held  Coronary artery disease -Currently chest pain-free  Hyperlipidemia -Continue statin  Anxiety -Continue Ativan  Chronic pain -Continue tramadol as needed  DVT Prophylaxis  Supratherapeutic INR  Code Status: Full  Family Communication: wife at bedside  Disposition Plan: Admitted. Pending further ID and ENT recommendations   Consultants ENT Infectious disease Neurosurgery  Procedures  Right revision mastoidectomy with drainage of abscess   Antibiotics   Anti-infectives (From admission, onward)   Start     Dose/Rate Route Frequency Ordered Stop   02/16/18 1100  vancomycin (VANCOCIN) 1,250 mg in sodium chloride 0.9 % 250 mL IVPB     1,250 mg 166.7 mL/hr over 90 Minutes Intravenous Every 12 hours 02/15/18 2304     02/16/18 0800  ceFEPIme (MAXIPIME) 2 g  in sodium chloride 0.9 % 100 mL IVPB      2 g 200 mL/hr over 30 Minutes Intravenous Every 8 hours 02/15/18 2304     02/15/18 2300  ceFEPIme (MAXIPIME) 2 g in sodium chloride 0.9 % 100 mL IVPB     2 g 200 mL/hr over 30 Minutes Intravenous  Once 02/15/18 2250 02/16/18 1001   02/15/18 2300  vancomycin (VANCOCIN) 2,000 mg in sodium chloride 0.9 % 500 mL IVPB     2,000 mg 250 mL/hr over 120 Minutes Intravenous  Once 02/15/18 2250 02/16/18 1001      Subjective:   [name] [name] seen and examined today. Has no complaints. Denies current chest pain, shortness of breath, abdominal pain, nausea vomiting, diarrhea constipation, dizziness or headache. Objective:   Vitals:   02/16/18 2359 02/17/18 0353 02/17/18 0737 02/17/18 0800  BP: 125/63 116/61 (!) 139/58   Pulse: 89 84  85  Resp: 18 14 14 16   Temp: 98.3 F (36.8 C) 97.9 F (36.6 C) (!) 97.5 F (36.4 C)   TempSrc: Oral Oral Oral   SpO2: 97% 99% 97% 93%  Weight:      Height:        Intake/Output Summary (Last 24 hours) at 02/17/2018 1055 Last data filed at 02/17/2018 1000 Gross per 24 hour  Intake 2270 ml  Output 100 ml  Net 2170 ml   Filed Weights   02/15/18 1408  Weight: 110.2 kg   Exam  General: Well developed, well nourished, NAD, appears stated age  25: NCAT, mucous membranes moist.   Neck: Supple  Cardiovascular: S1 S2 auscultated, no murmur, RRR  Respiratory: Clear to auscultation bilaterally with equal chest rise  Abdomen: Soft, nontender, nondistended, + bowel sounds  Extremities: warm dry without cyanosis clubbing or edema  Neuro: AAOx3, right sided facial palsy, decreased right eye movement, otherwise nonfocal  Psych: Normal affect and demeanor with intact judgement and insight  Data Reviewed: I have personally reviewed following labs and imaging studies  CBC: Recent Labs  Lab 02/15/18 1449 02/16/18 0737 02/17/18 0434  WBC 20.5* 17.9* 17.0*  NEUTROABS 8.4*  --   --   HGB 12.7* 12.1* 12.3*  HCT 40.1 39.0 38.8*  MCV 86.8 85.9 86.2    PLT 358 310 017   Basic Metabolic Panel: Recent Labs  Lab 02/15/18 1449 02/17/18 0434  NA 134* 135  K 4.3 4.2  CL 98 98  CO2 27 24  GLUCOSE 79 168*  BUN 28* 21  CREATININE 0.78 0.93  CALCIUM 10.5* 9.2   GFR: Estimated Creatinine Clearance: 79.8 mL/min (by C-G formula based on SCr of 0.93 mg/dL). Liver Function Tests: No results for input(s): AST, ALT, ALKPHOS, BILITOT, PROT, ALBUMIN in the last 168 hours. No results for input(s): LIPASE, AMYLASE in the last 168 hours. No results for input(s): AMMONIA in the last 168 hours. Coagulation Profile: Recent Labs  Lab 02/15/18 1449 02/16/18 0428 02/16/18 2009 02/17/18 0434  INR 4.03* 3.11 1.80 1.50   Cardiac Enzymes: No results for input(s): CKTOTAL, CKMB, CKMBINDEX, TROPONINI in the last 168 hours. BNP (last 3 results) No results for input(s): PROBNP in the last 8760 hours. HbA1C: No results for input(s): HGBA1C in the last 72 hours. CBG: Recent Labs  Lab 02/16/18 1152 02/16/18 1642 02/16/18 1853 02/16/18 2233 02/17/18 0549  GLUCAP 147* 146* 137* 125* 152*   Lipid Profile: No results for input(s): CHOL, HDL, LDLCALC, TRIG, CHOLHDL, LDLDIRECT in the last 72 hours. Thyroid Function Tests: No results  for input(s): TSH, T4TOTAL, FREET4, T3FREE, THYROIDAB in the last 72 hours. Anemia Panel: No results for input(s): VITAMINB12, FOLATE, FERRITIN, TIBC, IRON, RETICCTPCT in the last 72 hours. Urine analysis:    Component Value Date/Time   COLORURINE YELLOW 12/14/2017 0050   APPEARANCEUR CLEAR 12/14/2017 0050   LABSPEC 1.008 12/14/2017 0050   PHURINE 6.0 12/14/2017 0050   GLUCOSEU NEGATIVE 12/14/2017 0050   HGBUR NEGATIVE 12/14/2017 0050   BILIRUBINUR NEGATIVE 12/14/2017 0050   KETONESUR NEGATIVE 12/14/2017 0050   PROTEINUR NEGATIVE 12/14/2017 0050   NITRITE NEGATIVE 12/14/2017 0050   LEUKOCYTESUR NEGATIVE 12/14/2017 0050   Sepsis Labs: @LABRCNTIP (procalcitonin:4,lacticidven:4)  ) Recent Results (from the past  240 hour(s))  MRSA PCR Screening     Status: None   Collection Time: 02/15/18 11:36 PM  Result Value Ref Range Status   MRSA by PCR NEGATIVE NEGATIVE Final    Comment:        The GeneXpert MRSA Assay (FDA approved for NASAL specimens only), is one component of a comprehensive MRSA colonization surveillance program. It is not intended to diagnose MRSA infection nor to guide or monitor treatment for MRSA infections. Performed at [facility], [place] [address], [place], [place] [address]   Aerobic/Anaerobic Culture (surgical/deep wound)     Status: None (Preliminary result)   Collection Time: 02/16/18  1:14 AM  Result Value Ref Range Status   Specimen Description WOUND RIGHT EAR  Final   Special Requests NONE  Final   Gram Stain   Final    ABUNDANT WBC PRESENT, PREDOMINANTLY PMN NO ORGANISMS SEEN Performed at [facility], [place] [address] [place], [place] [address]    Culture PENDING  Incomplete   Report Status PENDING  Incomplete  Surgical pcr screen     Status: None   Collection Time: 02/16/18  3:08 PM  Result Value Ref Range Status   MRSA, PCR NEGATIVE NEGATIVE Final   Staphylococcus aureus NEGATIVE NEGATIVE Final    Comment: (NOTE) The Xpert SA Assay (FDA approved for NASAL specimens in patients 30 years of age and older), is one component of a comprehensive surveillance program. It is not intended to diagnose infection nor to guide or monitor treatment. Performed at [facility], [place] [address], [place], [place] [address]   Aerobic/Anaerobic Culture (surgical/deep wound)     Status: None (Preliminary result)   Collection Time: 02/16/18  9:40 PM  Result Value Ref Range Status   Specimen Description WOUND  Final   Special Requests RT MASTOID DRAINAGE  Final   Gram Stain   Final    ABUNDANT WBC PRESENT,BOTH PMN AND MONONUCLEAR NO ORGANISMS SEEN Performed at [facility], [address] [place], [place] [address]    Culture PENDING  Incomplete     Report Status PENDING  Incomplete      Radiology Studies: No results found.   Scheduled Meds: . atorvastatin  10 mg Oral QPM  . calcium-vitamin D  2 tablet Oral Q breakfast  . cholecalciferol  1,000 Units Oral q morning - 10a  . clotrimazole  1 application Topical q morning - 10a  . digoxin  0.125 mg Oral Q breakfast  . finasteride  5 mg Oral q morning - 10a  . insulin aspart  0-9 Units Subcutaneous TID WC  . LORazepam  1 mg Oral QHS  . multivitamin with minerals  1 tablet Oral Q breakfast  . omega-3 acid ethyl esters  1 capsule Oral BID  .  vitamin C  1,000 mg Oral Q breakfast  . vitamin E  400 Units Oral q morning - 10a   Continuous Infusions: . ceFEPime (MAXIPIME) IV 2 g (02/17/18 0832)  . lactated ringers 10 mL/hr at 02/16/18 2334  . vancomycin 1,250 mg (02/17/18 1028)     LOS: 2 days   Time Spent in minutes   30 minutes  Keyonta Madrid D.O. on 02/17/2018 at 10:55 AM  Between 7am to 7pm - Please see pager noted on [email or web address]  After 7pm go to [email or web address]  And look for the night coverage person covering for me after hours  [facility] Office  [phone]

## 2018-02-17 NOTE — Progress Notes (Signed)
CSW met with patient and wife at bedside. Patient wife talked with the CSW about how she would like to take the patient home. She talked about how she would not mind going to SNF if needed but would prefer to take her husband home. CSW alerted RNCM and RNCM discussed with the patient and wife about going home.  Patient and wife agreed that he would be discharged home.   CSW can be available if anything changes. For now, CSW signing off.    , LCSW-A [phone]   

## 2018-02-17 NOTE — Progress Notes (Signed)
Subjective: Resting comfortably in bed. No issues overnight.  Objective: Vital signs in last 24 hours: Temp:  [97.5 F (36.4 C)-99.2 F (37.3 C)] 99.2 F (37.3 C) (11/14 1140) Pulse Rate:  [76-99] 99 (11/14 1100) Resp:  [10-24] 24 (11/14 1100) BP: (116-139)/(49-77) 117/49 (11/14 1140) SpO2:  [92 %-99 %] 96 % (11/14 1100)  Physical Exam Constitutional: He isalert and responsive. No distress.  Head:Normocephalicand atraumatic. Right CN 7 palsy. Right Ear: Dressing removed. Incision c/d/i. Blood in mastoid cavity. Left Ear: Normal auricle and EAC. No drainage. Eyes: Right conjunctiva isinjected. Right eye exhibitsabnormal extraocular motion with 6th nerve palsy. Right pupil isreactive. Left pupil isreactive. Nose: Normal mucosa, septum, and turbinates. Mouth: Normal mucosa. Cardiovascular:Normal rateand regular rhythm.  Pulmonary/Chest:Effort normal. Nostridor. Norespiratory distress.  Neurological: Right cranial nerve 6 and 7 palsy. Skin: Skin iswarmand dry.  Nursing noteand vitalsreviewed.  Recent Labs    02/16/18 0737 02/17/18 0434  WBC 17.9* 17.0*  HGB 12.1* 12.3*  HCT 39.0 38.8*  PLT 310 301   Recent Labs    02/15/18 1449 02/17/18 0434  NA 134* 135  K 4.3 4.2  CL 98 98  CO2 27 24  GLUCOSE 79 168*  BUN 28* 21  CREATININE 0.78 0.93  CALCIUM 10.5* 9.2    Medications:  I have reviewed the patient's current medications. Scheduled: . atorvastatin  10 mg Oral QPM  . calcium-vitamin D  2 tablet Oral Q breakfast  . cholecalciferol  1,000 Units Oral q morning - 10a  . clotrimazole  1 application Topical q morning - 10a  . digoxin  0.125 mg Oral Q breakfast  . finasteride  5 mg Oral q morning - 10a  . insulin aspart  0-9 Units Subcutaneous TID WC  . LORazepam  1 mg Oral QHS  . multivitamin with minerals  1 tablet Oral Q breakfast  . omega-3 acid ethyl esters  1 capsule Oral BID  . vitamin C  1,000 mg Oral Q breakfast  . vitamin E  400 Units  Oral q morning - 10a  . warfarin  5 mg Oral ONCE-1800  . Warfarin - Pharmacist Dosing Inpatient   Does not apply q1800   Continuous: . heparin 1,400 Units/hr (02/17/18 1310)  . lactated ringers 10 mL/hr at 02/16/18 2334  . meropenem (MERREM) IV      Assessment/Plan: Right ear otomastoiditis, malignant otitis externa, and skull base osteomyelitis. - POD #1 s/p revision mastoidectomy and drainage of abscess. - Dressing removed. - May resume anticoagulation. - Will need long term IV abx.   LOS: 2 days   Heraclio Seidman W Majesty Stehlin 02/17/2018, 2:12 PM

## 2018-02-17 NOTE — Progress Notes (Signed)
ANTICOAGULATION CONSULT NOTE - follow up  Pharmacy Consult for warfarin and heparin Indication: atrial fibrillation  Allergies  Allergen Reactions  . Erythromycin Other (See Comments)  . Penicillins Hives and Other (See Comments)    Has patient had a PCN reaction causing immediate rash, facial/tongue/throat swelling, SOB or lightheadedness with hypotension: No Has patient had a PCN reaction causing severe rash involving mucus membranes or skin necrosis: No Has patient had a PCN reaction that required hospitalization No Has patient had a PCN reaction occurring within the last 10 years: No If all of the above answers are "NO", then may proceed with Cephalosporin use.  Has tolerated Rocephin    Patient Measurements: Height: 6' (182.9 cm) Weight: 243 lb (110.2 kg) IBW/kg (Calculated) : 77.6 Heparin Dosing Weight: 100kg  Vital Signs: Temp: 98.5 F (36.9 C) (11/14 1635) Temp Source: Oral (11/14 1635) BP: 143/64 (11/14 2151) Pulse Rate: 84 (11/14 2151)  Labs: Recent Labs    02/15/18 1449 02/16/18 0428 02/16/18 0737 02/16/18 2009 02/17/18 0434 02/17/18 2015  HGB 12.7*  --  12.1*  --  12.3*  --   HCT 40.1  --  39.0  --  38.8*  --   PLT 358  --  310  --  301  --   LABPROT 38.6* 31.6*  --  20.7* 17.9*  --   INR 4.03* 3.11  --  1.80 1.50  --   HEPARINUNFRC  --   --   --   --   --  0.26*  CREATININE 0.78  --   --   --  0.93  --     Estimated Creatinine Clearance: 79.8 mL/min (by C-G formula based on SCr of 0.93 mg/dL).   Medical History: Past Medical History:  Diagnosis Date  . Atrial fibrillation ([place])   . Chronic lymphocytic leukemia ([place])   . Coronary atherosclerosis of native coronary artery    Nonobstructive  . Essential hypertension, benign   . Hyperlipidemia   . IgA nephropathy    Dr. Lowanda Foster  . Type 2 diabetes mellitus ([place])   . UTI (urinary tract infection) july  10 th   taking  med     Medications:  Scheduled:  . atorvastatin  10 mg Oral QPM  .  calcium-vitamin D  2 tablet Oral Q breakfast  . cholecalciferol  1,000 Units Oral q morning - 10a  . clotrimazole  1 application Topical q morning - 10a  . digoxin  0.125 mg Oral Q breakfast  . finasteride  5 mg Oral q morning - 10a  . insulin aspart  0-9 Units Subcutaneous TID WC  . LORazepam  1 mg Oral QHS  . multivitamin with minerals  1 tablet Oral Q breakfast  . omega-3 acid ethyl esters  1 capsule Oral BID  . vitamin C  1,000 mg Oral Q breakfast  . vitamin E  400 Units Oral q morning - 10a  . Warfarin - Pharmacist Dosing Inpatient   Does not apply q1800    Assessment: 83 YOM on warfarin PTA for afib, supratherapeutic INR on admission, s/p vit K for surgery.  New thrombus in R-transverse/sigmoid sinuses.  S/p R-mastoidectomy w/drainage. INR 1.5 today and pharmacy consulted to start heparin and warfarin per primary and ENT.  H/H low but stable, plts 301.   PTA dose: 5mg  daily  8 hour heparin level is 0.26 on heparin 1400 units/hr, Subtherapeutic. No bleeding and no issues with infusion noted per RN.  Goal of Therapy:  INR  2-3 Heparin level 0.3-0.7 units/ml Monitor platelets by anticoagulation protocol: Yes   Plan:  Heparin 1000 units bolus x1 and increase heparin drip to 1600 units/hr 8 hour heparin level Daily CBC, INR, s/s bleeding Warfarin as previously ordered.    Nicole Cella, RPh Clinical Pharmacist Clinical Pharmacist Please check AMION for all [place] numbers 02/17/2018 10:42 PM

## 2018-02-17 NOTE — Care Management Note (Signed)
Case Management Note  Patient Details  Name: [name] [name] MRN: [id number] Date of Birth: 01-08-1935  Subjective/Objective:     Pt admitted with osteomyelitis of skull. He is from home with his spouse.       Pt is active with [facility] currently for therapies.          Action/Plan: Plan is for long term IV antibiotics. Pt was at [facility] for previous antibiotics and wife prefers he not have to return for a longer stent. She is anxious though about taking him home with IV antibiotics. CM spoke to [facility] with [facility] IV therapy and she is going to meet with the wife and go over the process and try and put her at ease. CM following.  Expected Discharge Date:                  Expected Discharge Plan:  Skilled Nursing Facility  In-House Referral:  Clinical Social Work  Discharge planning Services  CM Consult  Post Acute Care Choice:    Choice offered to:     DME Arranged:    DME Agency:     HH Arranged:    [place] Agency:     Status of Service:  In process, will continue to follow  If discussed at Long Length of Stay Meetings, dates discussed:    Additional Comments:  Pollie Friar, RN 02/17/2018, 1:44 PM

## 2018-02-17 NOTE — Progress Notes (Signed)
ANTICOAGULATION CONSULT NOTE - Initial Consult  Pharmacy Consult for warfarin and heparin Indication: atrial fibrillation  Allergies  Allergen Reactions  . Erythromycin Other (See Comments)  . Penicillins Hives and Other (See Comments)    Has patient had a PCN reaction causing immediate rash, facial/tongue/throat swelling, SOB or lightheadedness with hypotension: No Has patient had a PCN reaction causing severe rash involving mucus membranes or skin necrosis: No Has patient had a PCN reaction that required hospitalization No Has patient had a PCN reaction occurring within the last 10 years: No If all of the above answers are "NO", then may proceed with Cephalosporin use.  Has tolerated Rocephin    Patient Measurements: Height: 6' (182.9 cm) Weight: 243 lb (110.2 kg) IBW/kg (Calculated) : 77.6 Heparin Dosing Weight: 100kg  Vital Signs: Temp: 97.5 F (36.4 C) (11/14 0737) Temp Source: Oral (11/14 0737) BP: 139/58 (11/14 0737) Pulse Rate: 99 (11/14 1100)  Labs: Recent Labs    02/15/18 1449 02/16/18 0428 02/16/18 0737 02/16/18 2009 02/17/18 0434  HGB 12.7*  --  12.1*  --  12.3*  HCT 40.1  --  39.0  --  38.8*  PLT 358  --  310  --  301  LABPROT 38.6* 31.6*  --  20.7* 17.9*  INR 4.03* 3.11  --  1.80 1.50  CREATININE 0.78  --   --   --  0.93    Estimated Creatinine Clearance: 79.8 mL/min (by C-G formula based on SCr of 0.93 mg/dL).   Medical History: Past Medical History:  Diagnosis Date  . Atrial fibrillation ([place])   . Chronic lymphocytic leukemia ([place])   . Coronary atherosclerosis of native coronary artery    Nonobstructive  . Essential hypertension, benign   . Hyperlipidemia   . IgA nephropathy    Dr. Lowanda Foster  . Type 2 diabetes mellitus ([place])   . UTI (urinary tract infection) july  10 th   taking  med     Medications:  Scheduled:  . atorvastatin  10 mg Oral QPM  . calcium-vitamin D  2 tablet Oral Q breakfast  . cholecalciferol  1,000 Units Oral q  morning - 10a  . clotrimazole  1 application Topical q morning - 10a  . digoxin  0.125 mg Oral Q breakfast  . finasteride  5 mg Oral q morning - 10a  . insulin aspart  0-9 Units Subcutaneous TID WC  . LORazepam  1 mg Oral QHS  . multivitamin with minerals  1 tablet Oral Q breakfast  . omega-3 acid ethyl esters  1 capsule Oral BID  . vitamin C  1,000 mg Oral Q breakfast  . vitamin E  400 Units Oral q morning - 10a    Assessment: 83 YOM on warfarin PTA for afib, supratherapeutic INR on admission, s/p vit K for surgery.  New thrombus in R-transverse/sigmoid sinuses.  S/p R-mastoidectomy w/drainage. INR 1.5 today and pharmacy consulted to start heparin and warfarin per primary and ENT.  H/H low but stable, plts 301.   PTA dose: 5mg  daily  Goal of Therapy:  INR 2-3 Heparin level 0.3-0.7 units/ml Monitor platelets by anticoagulation protocol: Yes   Plan:  Heparin 4000 units (lower bolus now s/p surg), and gtt at 1400 units/hr Warfarin 5mg  x 1 tonight 8 hour heparin level Daily CBC, INR, s/s bleeding  Bertis Ruddy, PharmD Clinical Pharmacist Please check AMION for all [place] numbers 02/17/2018 12:02 PM

## 2018-02-17 NOTE — Evaluation (Signed)
Clinical/Bedside Swallow Evaluation Patient Details  Name: [name] [name] MRN: [id number] Date of Birth: 1934-10-04  Today's Date: 02/17/2018 Time: SLP Start Time (ACUTE ONLY): 1215 SLP Stop Time (ACUTE ONLY): 1245 SLP Time Calculation (min) (ACUTE ONLY): 30 min  Past Medical History:  Past Medical History:  Diagnosis Date  . Atrial fibrillation ([place])   . Chronic lymphocytic leukemia ([place])   . Coronary atherosclerosis of native coronary artery    Nonobstructive  . Essential hypertension, benign   . Hyperlipidemia   . IgA nephropathy    Dr. Lowanda Foster  . Type 2 diabetes mellitus ([place])   . UTI (urinary tract infection) july  10 th   taking  med    Past Surgical History:  Past Surgical History:  Procedure Laterality Date  . AMPUTATION Left 09/15/2012   Procedure: PARTIAL AMPUTATION 3rd TOE LEFT FOOT;  Surgeon: Marcheta Grammes, DPM;  Location: [facility];  Service: Orthopedics;  Laterality: Left;  . AMPUTATION Left 04/20/2013   Procedure: PARTIAL AMPUTATION 2ND TOE LEFT FOOT;  Surgeon: Marcheta Grammes, DPM;  Location: [facility];  Service: Orthopedics;  Laterality: Left;  . ANAL FISSURE REPAIR    . APPLICATION OF WOUND VAC Right 09/09/2016   Procedure: APPLICATION OF WOUND VAC;  Surgeon: Caprice Beaver, DPM;  Location: [facility];  Service: Podiatry;  Laterality: Right;  . CATARACT EXTRACTION W/PHACO Right 09/30/2015   Procedure: CATARACT EXTRACTION PHACO AND INTRAOCULAR LENS PLACEMENT (IOC);  Surgeon: Tonny Branch, MD;  Location: [facility];  Service: Ophthalmology;  Laterality: Right;  CDE: 11.78  . CATARACT EXTRACTION W/PHACO Left 10/31/2015   Procedure: CATARACT EXTRACTION PHACO AND INTRAOCULAR LENS PLACEMENT LEFT EYE; CDE:  9.10;  Surgeon: Tonny Branch, MD;  Location: [facility];  Service: Ophthalmology;  Laterality: Left;  . COLONOSCOPY  08/19/2011   Procedure: COLONOSCOPY;  Surgeon: Rogene Houston, MD;  [facility];  Service: Endoscopy;  Laterality: N/A;  930  . COLONOSCOPY N/A  09/19/2014   Procedure: COLONOSCOPY;  Surgeon: Rogene Houston, MD;  [facility];  Service: Endoscopy;  Laterality: N/A;  1055  . FEMORAL HERNIA REPAIR    . INCISION AND DRAINAGE Right 09/09/2016   Procedure: DEBRIDEMENT WOUND RT heel with wound vac attachment;  Surgeon: Caprice Beaver, DPM;  Location: [facility];  Service: Podiatry;  Laterality: Right;  . INCISION AND DRAINAGE OF WOUND Right 08/12/2016   Procedure: DEBRIDEMENT WOUND RIGHT HEEL;  Surgeon: Caprice Beaver, DPM;  Location: [facility];  Service: Podiatry;  Laterality: Right;  right heel  . MASS EXCISION Right 09/28/2017   Procedure: EXCISION OF EXTERNAL AUDITORY CANAL MASS;  Surgeon: Leta Baptist, MD;  Location: [facility];  Service: ENT;  Laterality: Right;  . Open repair left quadriceps tendon.  08/22/07   Dr. Aline Brochure  . Right total knee replacement    . TRANSURETHRAL RESECTION OF PROSTATE    . TYMPANOMASTOIDECTOMY Right 11/10/2017   Procedure: RIGHT TYMPANOMASTOIDECTOMY;  Surgeon: Leta Baptist, MD;  Location: [facility];  Service: ENT;  Laterality: Right;  . WOUND EXPLORATION Right 08/12/2016   Procedure: EXPLORATION OF WOUND FOR FOREIGN BODY RIGHT HEEL;  Surgeon: Caprice Beaver, DPM;  Location: [facility];  Service: Podiatry;  Laterality: Right;  right heel   HPI:  83 year old male admitted 04/17/17 with worsening of the patient's right mastoiditis and temporal bone osteomyelitis.  PMH: chronic right ear infection, mastoiditis and temporal bone osteomyelitis. right tympanomastoidectomy surgery 3 months ago, right facial paralysis (Bell's palsy),  Afib, CLL, coronary atherosclerosis of native coronary artery, hypertension, HLD, IgA nephropathy, DM2, UTI.  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 He has adequate dentition. Wife present, and reports improvement in swallow since MBS completed at [facility] last month. Pt is aware of risk of right pocketing, and independently uses either his tongue or finger to clear right sulcus. No oral leakage on any consistency. No overt s/s aspiration during po trials. Pt appears to tolerate current diet. Dys 3 was recommended following MBS, however, allowing regular solids permits pt full range of solids from which to choose. He is aware of what he can manage, and he and his wife verbalize safe swallow strategies recommended after MBS. Will continue current diet (reg/thin). No further ST intervention recommended at this time. Please reconsult if needs arise.   SLP Visit Diagnosis: Dysphagia, oropharyngeal phase (R13.12)(per MBS October 2019)    Aspiration Risk  Mild aspiration risk    Diet Recommendation Regular;Thin liquid   Liquid Administration via: Cup;Straw Medication Administration: (as tolerated) Supervision: Patient able to self feed Compensations: Small sips/bites;Slow rate Postural Changes: Seated upright at 90 degrees    Other  Recommendations Oral Care Recommendations: Oral care BID   Follow up Recommendations None          Prognosis Prognosis for Safe Diet Advancement: Good      Swallow Study   General Date of Onset: 02/15/18 HPI: 83 year old male admitted 04/17/17 with worsening of the patient's right mastoiditis and temporal bone osteomyelitis.  PMH: chronic right ear infection, mastoiditis and temporal bone osteomyelitis. right tympanomastoidectomy surgery 3 months ago, right facial paralysis (Bell's palsy), Afib, CLL, coronary atherosclerosis of native coronary artery, hypertension, HLD, IgA nephropathy, DM2, UTI.  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 Decreased bolus manipulation and cohesion, oral scatter/residue and piecemeal deglutition, delay in swallow initiation with  swallow trigger after filling the valleculae, reduced tongue base retraction, epiglottic deflection, and posterior pharyngeal wall constriction resulting in piecemeal deglutition, penetration during the swallow with trace amount to the cords without aspiration and residuals in the valleculae, posterior pharyngeal wall, and pyriforms post swallow. Although gross aspiration was not observed, Pt is at risk for aspiration due to severity of pharyngeal residuals across textures and consistencies. There was no perceivable benefit to NTL over thins, however regular texture residuals were greater in the valleculae than with puree textures. Recommend D3/mech soft with thin liquids via small cup sips, no straws, repeat swallows for each bite/sip, and clear throat periodically. Pt will need to be monitored with meals due to aspiration risk.    Diet Prior to this Study: Regular;Thin liquids Temperature Spikes Noted: No Respiratory Status: Room air History of Recent Intubation: Yes Length of Intubations (days): 1 days Date extubated: 02/16/18 Behavior/Cognition: Alert;Cooperative;Pleasant mood Oral Cavity Assessment: Within Functional Limits Oral Care Completed by SLP: No Oral Cavity - Dentition: Adequate natural dentition Vision: Functional for self-feeding Self-Feeding Abilities: Able to feed self Patient Positioning: Upright in bed Baseline Vocal Quality: Normal Volitional Cough: Strong Volitional Swallow: Able to elicit    Oral/Motor/Sensory Function Overall Oral Motor/Sensory Function: Moderate impairment(Pt with Bell's Palsy) Facial ROM: Reduced right Facial Symmetry: Abnormal symmetry right Facial Strength: Reduced right Facial Sensation: Reduced right Lingual ROM: Within Functional Limits Lingual  Symmetry: Within Functional Limits Lingual Strength: Within Functional Limits Lingual Sensation: Within Functional Limits Mandible: Within Functional Limits   Ice Chips Ice chips: Not tested   Thin  Liquid Thin Liquid: Within functional limits Presentation: Cup;Straw    Nectar Thick Nectar Thick Liquid: Not tested   Honey Thick Honey Thick Liquid: Not tested   Puree Puree: Within functional limits Presentation: Self Fed;Spoon   Solid     Solid: Within functional limits Presentation: [place] B. Quentin Ore, [facility], [place] Speech Language Pathologist [phone]  Shonna Chock 02/17/2018,12:48 PM

## 2018-02-17 NOTE — Progress Notes (Signed)
Pt heparin drip started as ordered and per protocol. P. Amo Jarrell Armond RN 

## 2018-02-17 NOTE — Anesthesia Postprocedure Evaluation (Signed)
Anesthesia Post Note  Patient: [name] [name]  Procedure(s) Performed: REVISION MASTOIDECTOMY (Right Ear) INCISION AND DRAINAGE ABSCESS (Right Ear)     Patient location during evaluation: PACU Anesthesia Type: General Level of consciousness: awake and alert Pain management: pain level controlled Vital Signs Assessment: post-procedure vital signs reviewed and stable Respiratory status: spontaneous breathing, nonlabored ventilation, respiratory function stable and patient connected to nasal cannula oxygen Cardiovascular status: blood pressure returned to baseline and stable Postop Assessment: no apparent nausea or vomiting Anesthetic complications: no    Last Vitals:  Vitals:   02/16/18 2359 02/17/18 0353  BP: 125/63 116/61  Pulse: 89 84  Resp: 18 14  Temp: 36.8 C 36.6 C  SpO2: 97% 99%    Last Pain:  Vitals:   02/17/18 0353  TempSrc: Oral  PainSc:                  Effie Berkshire

## 2018-02-17 NOTE — Consult Note (Signed)
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 atorvastatin  10 mg Oral QPM  . calcium-vitamin D  2 tablet Oral Q breakfast  . cholecalciferol  1,000 Units Oral q morning - 10a  . clotrimazole  1 application Topical q morning - 10a  . digoxin  0.125 mg Oral Q breakfast  . finasteride  5 mg Oral q morning - 10a  . insulin aspart  0-9 Units Subcutaneous TID WC  . LORazepam  1 mg Oral QHS  . multivitamin with minerals  1 tablet Oral Q breakfast  . omega-3 acid ethyl esters  1 capsule Oral BID  . vitamin C  1,000 mg Oral Q breakfast  . 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 D/c cefepime, vancomycin 2. Start meropenem 2g q8hrs 3. F/u surgical cultures 4. Will require PICC line for IV abx therapy at home prior to discharge  Assessment: Mr.Vanscyoc is a 83 yo M admit for right ear malignant otitis externa and skull base osteomyelitis. This issue has been ongoing for couple months despite multiple IV antibiotic course. Cultures were collected during this admission and will follow culture results. Recommend changing empiric antibiotic from cefepime and vancomycin to meropenem. Unclear which organism is causing  the infection but cannot rule out pseudomonas. Possibly fungal infection with candida. Considering lack of improvement on prior parenteral therapy, most like he requires better source control, which was performed with surgical debridement yesterday.   HPI: LAVONTAY KIRK is a 83 y.o. male w/ PMH of HTN, HLD, CLL, T2DM, A.fib, IgA nephropathy and recurrent mastoiditis admit for right ear malignant otitis externa and skull base osteomyelitis. His condition began in July which started out as a cyst in his external ear canal that was drained by Dr.Teoh. It was initially treated with oral clindamycin but had worsening pain and developed bell's palsy shortly after. Due to lack of significant improvement, he was admitted in August for right ear tympanomastoidectomy and he was started on IV ceftriaxone, metronidazole and vancomycin. In September he was admitted again for recurrent otitis and was re-started on another 6 week course of vanc, rocephin and flagyl ending on 01/23/18. He appeared to be stable on his antibiotic course and finished treatment on October. Last week however, he began to develop double vision in his right eye and was referred to a neuro-ophthalmologist in [place] who recommended an MRI of head/brain. MRI showed new diagnosis of skull base osteomyelitis as well as recurrent malignant otitis externa. He was admitted for evaluation.  Since admission, he was started on vancomycin and cefepime  and underwent a revision mastoidectomy by Dr.Teoh. ID was consulted for management of his skull base osteomyelitis and otitis externa. Today, he was examined and evaluated at bedside this AM with wife present. He states he feels fine and tolerated the procedure yesterday well. His wife brings up concerns about prolonged IV antibiotic for him at home. He denies any signs of fevers, chills, nausea, vomiting. Able to tolerate diet.   Review of Systems: Review of Systems  Constitutional: Negative for chills, fever  and malaise/fatigue.  HENT: Positive for ear discharge and ear pain. Negative for hearing loss and nosebleeds.   Eyes: Positive for blurred vision and photophobia. Negative for discharge.  Respiratory: Negative for cough and hemoptysis.   Cardiovascular: Negative for chest pain, palpitations and leg swelling.  Gastrointestinal: Negative for constipation, diarrhea, nausea and vomiting.  Neurological: Positive for focal weakness. Negative for dizziness, sensory change and headaches.    Past Medical History:  Diagnosis Date  . Atrial fibrillation ([place])   . Chronic lymphocytic leukemia ([place])   . Coronary atherosclerosis of native coronary artery    Nonobstructive  . Essential hypertension, benign   . Hyperlipidemia   . IgA nephropathy    Dr. Lowanda Foster  . Type 2 diabetes mellitus ([place])   . UTI (urinary tract infection) july  10 th   taking  med     Social History   Tobacco Use  . Smoking status: Former Smoker    Years: 10.00    Types: Cigarettes    Last attempt to quit: 04/07/1963    Years since quitting: 54.9  . Smokeless tobacco: Never Used  Substance Use Topics  . Alcohol use: No    Alcohol/week: 0.0 standard drinks  . Drug use: No    Family History  Problem Relation Age of Onset  . Atrial fibrillation Mother    Allergies  Allergen Reactions  . Erythromycin Other (See Comments)  . Penicillins Hives and Other (See Comments)    Has patient had a PCN reaction causing immediate rash, facial/tongue/throat swelling, SOB or lightheadedness with hypotension: No Has patient had a PCN reaction causing severe rash involving mucus membranes or skin necrosis: No Has patient had a PCN reaction that required hospitalization No Has patient had a PCN reaction occurring within the last 10 years: No If all of the above answers are "NO", then may proceed with Cephalosporin use.  Has tolerated Rocephin    OBJECTIVE: Blood pressure (!) 139/58, pulse 99, temperature (!) 97.5 F (36.4  C), temperature source Oral, resp. rate (!) 24, height 6' (1.829 m), weight 110.2 kg, SpO2 96 %.  Physical Exam  Constitutional: He is oriented to person, place, and time. He appears well-developed and well-nourished. No distress.  HENT:  Head: Normocephalic and atraumatic.  Mouth/Throat: Oropharynx is clear and moist.  Inflamed right ear with sanguinous drainage  Eyes: Conjunctivae are normal. Right eye exhibits no discharge. Left eye exhibits no discharge.  6th nerve palsy limiting eye movement  Neck: Normal range of motion. Neck supple.  Cardiovascular: Normal rate, normal heart sounds and intact distal pulses.  Irregular rhythm  Pulmonary/Chest: Effort normal and breath sounds normal. No respiratory distress. He has no rales.  Abdominal: Soft. Bowel sounds are normal. There is no tenderness.  Musculoskeletal: Normal range of motion. He exhibits no edema or tenderness.  Neurological: He is alert and oriented to person, place, and time. A cranial nerve deficit (bell's palsy) is present.  Skin: Skin is warm and dry.    Lab  Results Lab Results  Component Value Date   WBC 17.0 (H) 02/17/2018   HGB 12.3 (L) 02/17/2018   HCT 38.8 (L) 02/17/2018   MCV 86.2 02/17/2018   PLT 301 02/17/2018    Lab Results  Component Value Date   CREATININE 0.93 02/17/2018   BUN 21 02/17/2018   NA 135 02/17/2018   K 4.2 02/17/2018   CL 98 02/17/2018   CO2 24 02/17/2018    Lab Results  Component Value Date   ALT 17 12/14/2017   AST 19 12/14/2017   ALKPHOS 98 12/14/2017   BILITOT 0.8 12/14/2017     Microbiology: Recent Results (from the past 240 hour(s))  MRSA PCR Screening     Status: None   Collection Time: 02/15/18 11:36 PM  Result Value Ref Range Status   MRSA by PCR NEGATIVE NEGATIVE Final    Comment:        The GeneXpert MRSA Assay (FDA approved for NASAL specimens only), is one component of a comprehensive MRSA colonization surveillance program. It is not intended to diagnose  MRSA infection nor to guide or monitor treatment for MRSA infections. Performed at [facility], [place] [address], [place], [place] [address]   Aerobic/Anaerobic Culture (surgical/deep wound)     Status: None (Preliminary result)   Collection Time: 02/16/18  1:14 AM  Result Value Ref Range Status   Specimen Description WOUND RIGHT EAR  Final   Special Requests NONE  Final   Gram Stain   Final    ABUNDANT WBC PRESENT, PREDOMINANTLY PMN NO ORGANISMS SEEN Performed at [facility], [place] [address], [place], [place] [address]    Culture PENDING  Incomplete   Report Status PENDING  Incomplete  Surgical pcr screen     Status: None   Collection Time: 02/16/18  3:08 PM  Result Value Ref Range Status   MRSA, PCR NEGATIVE NEGATIVE Final   Staphylococcus aureus NEGATIVE NEGATIVE Final    Comment: (NOTE) The Xpert SA Assay (FDA approved for NASAL specimens in patients 78 years of age and older), is one component of a comprehensive surveillance program. It is not intended to diagnose infection nor to guide or monitor treatment. Performed at [facility], [place] [address], [place], [place] [address]   Aerobic/Anaerobic Culture (surgical/deep wound)     Status: None (Preliminary result)   Collection Time: 02/16/18  9:40 PM  Result Value Ref Range Status   Specimen Description WOUND  Final   Special Requests RT MASTOID DRAINAGE  Final   Gram Stain   Final    ABUNDANT WBC PRESENT,BOTH PMN AND MONONUCLEAR NO ORGANISMS SEEN Performed at [facility], [address] [place], [place] [address]    Culture PENDING  Incomplete   Report Status PENDING  Incomplete    Mosetta Anis, MD, PGY1 02/17/2018, 11:15 AM

## 2018-02-18 ENCOUNTER — Inpatient Hospital Stay: Payer: Self-pay

## 2018-02-18 DIAGNOSIS — R5383 Other fatigue: Secondary | ICD-10-CM

## 2018-02-18 DIAGNOSIS — R0602 Shortness of breath: Secondary | ICD-10-CM

## 2018-02-18 DIAGNOSIS — B965 Pseudomonas (aeruginosa) (mallei) (pseudomallei) as the cause of diseases classified elsewhere: Secondary | ICD-10-CM

## 2018-02-18 DIAGNOSIS — R5381 Other malaise: Secondary | ICD-10-CM

## 2018-02-18 LAB — BASIC METABOLIC PANEL
Anion gap: 12 (ref 5–15)
BUN: 14 mg/dL (ref 8–23)
CO2: 26 mmol/L (ref 22–32)
CREATININE: 0.98 mg/dL (ref 0.61–1.24)
Calcium: 9.4 mg/dL (ref 8.9–10.3)
Chloride: 97 mmol/L — ABNORMAL LOW (ref 98–111)
GFR calc Af Amer: >60 mL/min (ref >60)
Glucose, Bld: 236 mg/dL — ABNORMAL HIGH (ref 70–99)
Potassium: 3.8 mmol/L (ref 3.5–5.1)
SODIUM: 135 mmol/L (ref 135–145)

## 2018-02-18 LAB — CBC
HCT: 39.2 % (ref 39.0–52.0)
Hemoglobin: 12.5 g/dL — ABNORMAL LOW (ref 13.0–17.0)
MCH: 27.2 pg (ref 26.0–34.0)
MCHC: 31.9 g/dL (ref 30.0–36.0)
MCV: 85.2 fL (ref 80.0–100.0)
PLATELETS: 307 10*3/uL (ref 150–400)
RBC: 4.6 MIL/uL (ref 4.22–5.81)
RDW: 14.6 % (ref 11.5–15.5)
WBC: 16.4 10*3/uL — ABNORMAL HIGH (ref 4.0–10.5)
nRBC: 0 % (ref 0.0–0.2)

## 2018-02-18 LAB — GLUCOSE, CAPILLARY
Glucose-Capillary: 205 mg/dL — ABNORMAL HIGH (ref 70–99)
Glucose-Capillary: 243 mg/dL — ABNORMAL HIGH (ref 70–99)
Glucose-Capillary: 259 mg/dL — ABNORMAL HIGH (ref 70–99)
Glucose-Capillary: 248 mg/dL — ABNORMAL HIGH (ref 70–99)

## 2018-02-18 LAB — PROTIME-INR
INR: 1.57
PROTHROMBIN TIME: 18.6 s — AB (ref 11.4–15.2)

## 2018-02-18 LAB — HEPARIN LEVEL (UNFRACTIONATED): HEPARIN UNFRACTIONATED: 0.49 [IU]/mL (ref 0.30–0.70)

## 2018-02-18 MED ORDER — ENSURE ENLIVE PO LIQD
237.0000 mL | Freq: Two times a day (BID) | ORAL | Status: DC
  Administered 2018-02-19 – 2018-02-21 (×4): 237 mL via ORAL

## 2018-02-18 MED ORDER — WARFARIN SODIUM 5 MG PO TABS
5.0000 mg | ORAL_TABLET | Freq: Once | ORAL | Status: AC
  Administered 2018-02-18: 5 mg via ORAL
  Filled 2018-02-18: qty 1

## 2018-02-18 MED ORDER — INSULIN DETEMIR 100 UNIT/ML ~~LOC~~ SOLN
20.0000 [IU] | Freq: Every day | SUBCUTANEOUS | Status: DC
  Administered 2018-02-18 – 2018-02-21 (×4): 20 [IU] via SUBCUTANEOUS
  Filled 2018-02-18 (×4): qty 0.2

## 2018-02-18 NOTE — Progress Notes (Signed)
[facility]  Date of Admission:  02/15/2018   Total days of antibiotics 3        Day 1 vanc, cefepime        Day 3 changed to meropenem          Principal Problem:   Osteomyelitis of skull (HCC) Active Problems:   HLD (hyperlipidemia)   CORONARY ATHEROSCLEROSIS NATIVE CORONARY ARTERY   Type 2 diabetes mellitus (HCC)   Essential hypertension   Facial palsy   Atrial fibrillation, chronic   Chronic diastolic CHF (congestive heart failure) (HCC)   Epidural abscess   Anemia   Physical deconditioning   Anxiety   BPH (benign prostatic hyperplasia)   Chronic pain   . atorvastatin  10 mg Oral QPM  . calcium-vitamin D  2 tablet Oral Q breakfast  . cholecalciferol  1,000 Units Oral q morning - 10a  . clotrimazole  1 application Topical q morning - 10a  . digoxin  0.125 mg Oral Q breakfast  . finasteride  5 mg Oral q morning - 10a  . insulin aspart  0-9 Units Subcutaneous TID WC  . LORazepam  1 mg Oral QHS  . multivitamin with minerals  1 tablet Oral Q breakfast  . omega-3 acid ethyl esters  1 capsule Oral BID  . vitamin C  1,000 mg Oral Q breakfast  . vitamin E  400 Units Oral q morning - 10a  . warfarin  5 mg Oral ONCE-1800  . Warfarin - Pharmacist Dosing Inpatient   Does not apply q1800    SUBJECTIVE: Mr.Mclennan was examined and evaluated at bedside this AM with family present. He was observed sleeping comfortably in bed. Denies any acute complaints and states he is fine. Overnight he had rapid response called for lethargy and desaturation event but his oxygenation status recovered spontaneously. His wife still would like to know the duration of his IV abx therapy. Explained that we will monitor his condition to see if he will require extended therapy after at least 4 weeks on IV abx.  Review of Systems: Review of Systems  Constitutional: Positive for malaise/fatigue. Negative for chills and fever.  Respiratory: Positive for shortness of  breath.   Cardiovascular: Negative for chest pain and palpitations.  Gastrointestinal: Negative for constipation, diarrhea, nausea and vomiting.  Genitourinary: Negative for dysuria and urgency.    Past Medical History:  Diagnosis Date  . Atrial fibrillation ([place])   . Chronic lymphocytic leukemia ([place])   . Coronary atherosclerosis of native coronary artery    Nonobstructive  . Essential hypertension, benign   . Hyperlipidemia   . IgA nephropathy    Dr. Lowanda Foster  . Type 2 diabetes mellitus ([place])   . UTI (urinary tract infection) july  10 th   taking  med     Social History   Tobacco Use  . Smoking status: Former Smoker    Years: 10.00    Types: Cigarettes    Last attempt to quit: 04/07/1963    Years since quitting: 54.9  . Smokeless tobacco: Never Used  Substance Use Topics  . Alcohol use: No    Alcohol/week: 0.0 standard drinks  . Drug use: No    Family History  Problem Relation Age of Onset  . Atrial fibrillation Mother    Allergies  Allergen Reactions  . Erythromycin Other (See Comments)  . Penicillins Hives and Other (See Comments)    Has patient had a PCN  reaction causing immediate rash, facial/tongue/throat swelling, SOB or lightheadedness with hypotension: No Has patient had a PCN reaction causing severe rash involving mucus membranes or skin necrosis: No Has patient had a PCN reaction that required hospitalization No Has patient had a PCN reaction occurring within the last 10 years: No If all of the above answers are "NO", then may proceed with Cephalosporin use.  Has tolerated Rocephin    OBJECTIVE: Vitals:   02/18/18 0016 02/18/18 0416 02/18/18 0816 02/18/18 1220  BP: (!) 125/48 125/79 128/71 (!) 145/76  Pulse: 91 98 92 94  Resp: 16 (!) 26 13 (!) 22  Temp: 98.2 F (36.8 C)   98.7 F (37.1 C)  TempSrc: Oral   Oral  SpO2: 92% 96% 97% 96%  Weight:      Height:       Body mass index is 32.96 kg/m.  Physical Exam  Lab Results Lab Results    Component Value Date   WBC 16.4 (H) 02/18/2018   HGB 12.5 (L) 02/18/2018   HCT 39.2 02/18/2018   MCV 85.2 02/18/2018   PLT 307 02/18/2018    Lab Results  Component Value Date   CREATININE 0.98 02/18/2018   BUN 14 02/18/2018   NA 135 02/18/2018   K 3.8 02/18/2018   CL 97 (L) 02/18/2018   CO2 26 02/18/2018    Lab Results  Component Value Date   ALT 17 12/14/2017   AST 19 12/14/2017   ALKPHOS 98 12/14/2017   BILITOT 0.8 12/14/2017     Microbiology: Recent Results (from the past 240 hour(s))  MRSA PCR Screening     Status: None   Collection Time: 02/15/18 11:36 PM  Result Value Ref Range Status   MRSA by PCR NEGATIVE NEGATIVE Final    Comment:        The GeneXpert MRSA Assay (FDA approved for NASAL specimens only), is one component of a comprehensive MRSA colonization surveillance program. It is not intended to diagnose MRSA infection nor to guide or monitor treatment for MRSA infections. Performed at [facility], [place] [address] [place], [place] [address]   Aerobic/Anaerobic Culture (surgical/deep wound)     Status: None (Preliminary result)   Collection Time: 02/16/18  1:14 AM  Result Value Ref Range Status   Specimen Description WOUND RIGHT EAR  Final   Special Requests NONE  Final   Gram Stain   Final    ABUNDANT WBC PRESENT, PREDOMINANTLY PMN NO ORGANISMS SEEN Performed at [facility], [place] [address], [place], [place] [address]    Culture   Final    RARE PSEUDOMONAS AERUGINOSA NO ANAEROBES ISOLATED; CULTURE IN PROGRESS FOR 5 DAYS    Report Status PENDING  Incomplete   Organism ID, Bacteria PSEUDOMONAS AERUGINOSA  Final      Susceptibility   Pseudomonas aeruginosa - MIC*    CEFTAZIDIME 2 SENSITIVE Sensitive     CIPROFLOXACIN <=0.25 SENSITIVE Sensitive     GENTAMICIN <=1 SENSITIVE Sensitive     IMIPENEM 1 SENSITIVE Sensitive     PIP/TAZO 8 SENSITIVE Sensitive     CEFEPIME 2 SENSITIVE Sensitive     * RARE PSEUDOMONAS AERUGINOSA   Surgical pcr screen     Status: None   Collection Time: 02/16/18  3:08 PM  Result Value Ref Range Status   MRSA, PCR NEGATIVE NEGATIVE Final   Staphylococcus aureus NEGATIVE NEGATIVE Final    Comment: (NOTE) The Xpert SA Assay (FDA approved for NASAL specimens in patients  11 years of age and older), is one component of a comprehensive surveillance program. It is not intended to diagnose infection nor to guide or monitor treatment. Performed at [facility], [place] [address] [place], [place] [address]   Aerobic/Anaerobic Culture (surgical/deep wound)     Status: None (Preliminary result)   Collection Time: 02/16/18  9:40 PM  Result Value Ref Range Status   Specimen Description WOUND  Final   Special Requests RT MASTOID DRAINAGE  Final   Gram Stain   Final    ABUNDANT WBC PRESENT,BOTH PMN AND MONONUCLEAR NO ORGANISMS SEEN    Culture   Final    FEW GRAM NEGATIVE RODS SUSCEPTIBILITIES TO FOLLOW Performed at [facility], [place] [address], [place], [place] [address]    Report Status PENDING  Incomplete     ASSESSMENT: Mr.Kritikos admitted for malignant otitis externa with skull base osteomyelitis. His surgical culture is now growing pansensitive pseudomonas. He will need at least 4 more weeks of IV therapy. Recommend continuing meropenem at this time and most likely will transition to Ciprofloxacin as outpatient on post-discharge follow up unless his disease course worsens.  PLAN:  R malignant otitis externa w/ skull base osteomyelitis 1. C/w meropenem 2g q8hrs 2. F/u final culture report 3. Will need PICC line prior to discharge for IV abx therapy  Mosetta Anis, MD, PGY1 02/18/2018, 2:16 PM

## 2018-02-18 NOTE — Progress Notes (Signed)
PT Cancellation Note  Patient Details Name: [name] [name] MRN: [id number] DOB: 09/24/1934   Cancelled Treatment:    Reason Eval/Treat Not Completed: Patient declined, no reason specified.  Wife declined for pt.  Pt having a bad day. 02/18/2018  [name], [place] [phone]  (pager) [phone]  (office)   Tessie Fass Sha Burling 02/18/2018, 6:05 PM

## 2018-02-18 NOTE — NC FL2 (Signed)
[place] LEVEL OF CARE SCREENING TOOL     IDENTIFICATION  Patient Name: [name] [name] Birthdate: October 22, 1934 Sex: male Admission Date (Current Location): 02/15/2018  [facility] and [place] Number:  [employer] and Address:  [facility] [place], [place] [address] [place], [place] [address]      Provider Number: [id number]  Attending Physician Name and Address:  Cristal Ford, DO  Relative Name and Phone Number:       Current Level of Care: Hospital Recommended Level of Care: [place] Prior Approval Number:    Date Approved/Denied:   PASRR Number: [id number]  Discharge Plan: SNF    Current Diagnoses: Patient Active Problem List   Diagnosis Date Noted  . Osteomyelitis of skull ([place]) 02/15/2018  . Epidural abscess 02/15/2018  . Anemia 02/15/2018  . Physical deconditioning 02/15/2018  . Anxiety 02/15/2018  . BPH (benign prostatic hyperplasia) 02/15/2018  . Chronic pain 02/15/2018  . Status post peripherally inserted central catheter (PICC) central line placement   . Hemorrhagic otitis externa of right ear 12/13/2017  . Hemorrhagic otitis externa 12/13/2017  . Uncontrolled type 2 diabetes mellitus with hyperglycemia, with long-term current use of insulin ([place]) 11/06/2017  . Acute mastoiditis 11/05/2017  . Atrial fibrillation, chronic 11/05/2017  . Chronic diastolic CHF (congestive heart failure) ([place]) 11/05/2017  . Mastoiditis 11/04/2017  . Disorder of right facial nerve 11/04/2017  . Facial palsy 11/04/2017  . Essential hypertension 09/10/2016  . Rt Heel Osteomyelitis ([place]) 09/08/2016  . Cellulitis of heel, right 09/08/2016  . Hypoglycemia 06/21/2016  . Type 2 diabetes mellitus ([place])   . Chronic lymphocytic leukemia ([place])   . Encounter for therapeutic drug monitoring 05/16/2013  . Cardiac murmur 10/21/2011  . Long term current use of anticoagulant therapy 06/27/2010  . HLD (hyperlipidemia) 05/21/2008  .  CORONARY ATHEROSCLEROSIS NATIVE CORONARY ARTERY 05/21/2008  . KNEE PAIN 06/14/2007    Orientation RESPIRATION BLADDER Height & Weight     Self, Time, Situation, Place  Normal Continent Weight: 243 lb (110.2 kg) Height:  6' (182.9 cm)  BEHAVIORAL SYMPTOMS/MOOD NEUROLOGICAL BOWEL NUTRITION STATUS  (None) (Facial palsy.) Continent Diet(Heart healthy/carb modified. Soft items preferred. Facial droop w/ Bell's Palsy.)  AMBULATORY STATUS COMMUNICATION OF NEEDS Skin   Limited Assist Verbally Bruising, Other (Comment), Surgical wounds(Cracking.)                       Personal Care Assistance Level of Assistance              Functional Limitations Info  Sight, Hearing, Speech Sight Info: Adequate Hearing Info: Adequate Speech Info: Adequate    SPECIAL CARE FACTORS FREQUENCY  PT (By licensed PT), Blood pressure     PT Frequency: 5 x week              Contractures Contractures Info: Not present    Additional Factors Info  Code Status, Allergies, Psychotropic Code Status Info: Full code Allergies Info: Erythromycin, Penicillins. Psychotropic Info: Anxiety: Ativan 1 mg PO QHS         Current Medications (02/18/2018):  This is the current hospital active medication list Current Facility-Administered Medications  Medication Dose Route Frequency Provider Last Rate Last Dose  . atorvastatin (LIPITOR) tablet 10 mg  10 mg Oral QPM Leta Baptist, MD   10 mg at 02/17/18 1746  . calcium-vitamin D (OSCAL WITH D) 500-200 MG-UNIT per tablet 2 tablet  2 tablet Oral Q breakfast Teoh, Su,  MD   2 tablet at 02/18/18 0831  . cholecalciferol (VITAMIN D) tablet 1,000 Units  1,000 Units Oral q morning - 10a Leta Baptist, MD   1,000 Units at 02/18/18 1001  . clotrimazole (LOTRIMIN) 1 % cream 1 application  1 application Topical q morning - 10a Leta Baptist, MD   1 application at 21/19/41 1001  . digoxin (LANOXIN) tablet 0.125 mg  0.125 mg Oral Q breakfast Benjamine Mola, Su, MD   0.125 mg at 02/18/18 0831  .  finasteride (PROSCAR) tablet 5 mg  5 mg Oral q morning - 10a Benjamine Mola, Su, MD   5 mg at 02/18/18 1001  . heparin ADULT infusion 100 units/mL (25000 units/251mL sodium chloride 0.45%)  1,600 Units/hr Intravenous Continuous Cristal Ford, DO 16 mL/hr at 02/18/18 0558 1,600 Units/hr at 02/18/18 0558  . hydrALAZINE (APRESOLINE) injection 5 mg  5 mg Intravenous Q4H PRN Benjamine Mola, Su, MD      . insulin aspart (novoLOG) injection 0-9 Units  0-9 Units Subcutaneous TID WC Leta Baptist, MD   3 Units at 02/18/18 1237  . lactated ringers infusion   Intravenous Continuous Leta Baptist, MD 10 mL/hr at 02/17/18 1800    . LORazepam (ATIVAN) tablet 1 mg  1 mg Oral QHS Leta Baptist, MD   1 mg at 02/17/18 2152  . meropenem (MERREM) 2 g in sodium chloride 0.9 % 100 mL IVPB  2 g Intravenous Q8H Thayer Headings, MD 200 mL/hr at 02/18/18 0558 2 g at 02/18/18 0558  . multivitamin with minerals tablet 1 tablet  1 tablet Oral Q breakfast Leta Baptist, MD   1 tablet at 02/18/18 0831  . omega-3 acid ethyl esters (LOVAZA) capsule 1 g  1 capsule Oral BID Leta Baptist, MD   1 g at 02/18/18 1001  . traMADol (ULTRAM) tablet 50 mg  50 mg Oral Q6H PRN Leta Baptist, MD   50 mg at 02/17/18 0014  . vitamin C (ASCORBIC ACID) tablet 1,000 mg  1,000 mg Oral Q breakfast Benjamine Mola, Su, MD   1,000 mg at 02/18/18 0831  . vitamin E capsule 400 Units  400 Units Oral q morning - 10a Leta Baptist, MD   400 Units at 02/18/18 1001  . warfarin (COUMADIN) tablet 5 mg  5 mg Oral ONCE-1800 Bertis Ruddy, RPH      . Warfarin - Pharmacist Dosing Inpatient   Does not apply q1800 Bertis Ruddy, [facility]         Discharge Medications: Please see discharge summary for a list of discharge medications.  Relevant Imaging Results:  Relevant Lab Results:   Additional Information SS#: [id number]. Will need 8 weeks IV antibiotics.  Candie Chroman, LCSW

## 2018-02-18 NOTE — Significant Event (Addendum)
Rapid Response Event Note  Overview: Bleeding and Decreased LOC  Initial Focused Assessment: Called by RNs about patient having some bleeding from RT ear and increased lethargy. When I arrived, RNs informed that patient's oxygen saturations were in the 60s and oxygen was applied. Patient was awake/alert, follows commands, oriented, and able to move all extremities. + Hard of hearing. Skin warm and dry, not in acute distress, + pulses. RT ear had some old dried blood but was not actively bleeding when I arrived. Patient was on Heparin infusion as well. HR 80 SR, SBP 120-130s, MAP > 60, RR 18-22, 100% on 2L [place]. Per wife and staff, patient has not slept well tonight and was up most of the night  Interventions: - Oxygen was removed, saturations maintained > 95 % on RA - patient is mouth breather as well. - RPH was called and updated on the bleeding as well  Plan of Care: - Monitor RT ear for bleeding  Event Summary:    at    Call Time [place]  [name] [name] R

## 2018-02-18 NOTE — Progress Notes (Addendum)
Inpatient Diabetes Program Recommendations  AACE/ADA: New Consensus Statement on Inpatient Glycemic Control (2015)  Target Ranges:  Prepandial:   less than 140 mg/dL      Peak postprandial:   less than 180 mg/dL (1-2 hours)      Critically ill patients:  140 - 180 mg/dL   Lab Results  Component Value Date   GLUCAP 205 (H) 02/18/2018   HGBA1C 9.5 (H) 12/14/2017    Results for [name] [name], [name] [name] (MRN [id number]) as of 02/18/2018 11:23  Ref. Range 02/16/2018 22:33 02/17/2018 05:49 02/17/2018 12:20 02/17/2018 16:38 02/17/2018 21:06 02/18/2018 05:38  Glucose-Capillary Latest Ref Range: 70 - 99 mg/dL 125 (H) 152 (H) 245 (H) 247 (H) 254 (H) 205 (H)     DM2  Home DM meds:  Novolog (0-4 units) BID                               Levemir 60 units daily                               Victoza 1.8 mg daily    Current DM meds:  Novolog sensitive scale (0-9 units) tid ac   Note patient's blood sugars are elevated.     MD please consider following inpatient diabetes recommendations:    Add Levemir 20 units hs     (patient takes 60 units at home).   Thank you.  -- Will follow during hospitalization.--  Jonna Clark RN, MSN Diabetes Coordinator Inpatient Glycemic Control Team Team Pager: [phone] (8am-5pm)

## 2018-02-18 NOTE — Progress Notes (Signed)
CM spoke to the patients spouse and she has decided she would like him to go to [facility] for the duration of the IV abx. CSW updated.

## 2018-02-18 NOTE — Clinical Social Work Note (Signed)
Per [facility], patient's wife now wanting him to go to [facility] for 8 weeks IV abx and should be ready for discharge by Monday. Spoke with SNF admissions coordinator. She will check his SNF days but said they should have a bed on Monday.  Dayton Scrape, [place]

## 2018-02-18 NOTE — Care Management Important Message (Signed)
Important Message  Patient Details  Name: [name] [name] MRN: [id number] Date of Birth: [birth year]   Medicare Important Message Given:  Yes    Viviene Thurston 02/18/2018, 3:30 PM

## 2018-02-18 NOTE — Progress Notes (Signed)
Nutrition Follow-up  DOCUMENTATION CODES:   Obesity unspecified  INTERVENTION:  Provide Ensure Enlive po BID, each supplement provides 350 kcal and 20 grams of protein.  Encourage adequate PO intake.   NUTRITION DIAGNOSIS:   Increased nutrient needs related to (surgery) as evidenced by estimated needs; ongoing  GOAL:   Patient will meet greater than or equal to 90% of their needs; progressing  MONITOR:   PO intake, Supplement acceptance, Labs, Weight trends, I & O's, Skin  REASON FOR ASSESSMENT:   Malnutrition Screening Tool    ASSESSMENT:   83 y.o. male with medical history significant of chronic A. fib, CLL, coronary artery disease, hypertension, hyperlipidemia, IgA nephropathy, type 2 diabetes, history of recurrent mastoiditis, prior partial excision, ongoing topical antibiotic therapy, history of right facial palsy presenting to the hospital for evaluation of abnormal MRI. Pt with Skull base osteomyelitis, epidural abscess, sigmoid sinus thrombus.  Procedure 11/13: Right revision mastoidectomy with drainage of abscess   Wife at bedside. She reports pt has been eating well at meals with 90-100% meal completion. RD to order Ensure to aid in increased protein needs. Wife has been encouraging pt po intake. Labs and medications reviewed.   NUTRITION - FOCUSED PHYSICAL EXAM:    Most Recent Value  Orbital Region  Unable to assess  Upper Arm Region  No depletion  Thoracic and Lumbar Region  No depletion  Buccal Region  Unable to assess  Temple Region  No depletion  Clavicle Bone Region  No depletion  Clavicle and Acromion Bone Region  No depletion  Scapular Bone Region  No depletion  Dorsal Hand  No depletion  Patellar Region  No depletion  Anterior Thigh Region  No depletion  Posterior Calf Region  No depletion  Edema (RD Assessment)  Mild  Hair  Reviewed  Eyes  Reviewed  Mouth  Reviewed  Skin  Reviewed  Nails  Reviewed       Diet Order:   Diet Order        Diet heart healthy/carb modified Room service appropriate? Yes; Fluid consistency: Thin  Diet effective now              EDUCATION NEEDS:   Not appropriate for education at this time  Skin:  Skin Assessment: Reviewed RN Assessment  Last BM:  11/12  Height:   Ht Readings from Last 1 Encounters:  02/15/18 6' (1.829 m)    Weight:   Wt Readings from Last 1 Encounters:  02/15/18 110.2 kg    Ideal Body Weight:  80.9 kg  BMI:  Body mass index is 32.96 kg/m.  Estimated Nutritional Needs:   Kcal:  1900-2100  Protein:  95-110 grams  Fluid:  1.9 - 2.1 L/day    Corrin Parker, MS, RD, LDN Pager # [phone] After hours/ weekend pager # [phone]

## 2018-02-18 NOTE — Progress Notes (Addendum)
Patients oxygen saturations noted to be around 60%. Patient slow to respond to staff and eventually responded to wife. Respirations even and unlabored, skin warm and dry. Blood sugar checked. Blood pressure 129/75 Heart rate 79. Oxygen initiated briefly however within minutes oxygen level was 94-96% on room air. Rapid Response was initially called to bedside. Patient also having a slight increase in amount of blood from right ear. Prior to know patient has only had 2 saturated small cotton balls. Now noted about a 4 inch circle of thin blood on patients pillow case. Rapid Response notified pharmacy of bleeding to assist them in adjusting rate. Patient returned to baseline and is alert and oriented X 4. Prior to this patient had very little sleep tonight. Dr Ree Kida aware around 0710.

## 2018-02-18 NOTE — Progress Notes (Signed)
PROGRESS NOTE    YOUSEF HUGE  ZOX:[id number] DOB: 1934-05-09 DOA: 02/15/2018 PCP: [name], MD   Brief Narrative:  HPI On 02/15/2018 by Dr. Shela Leff [name] [name] is a 83 y.o. male with medical history significant of chronic A. fib, CLL, coronary artery disease, hypertension, hyperlipidemia, IgA nephropathy, type 2 diabetes, history of recurrent mastoiditis, prior partial excision, ongoing topical antibiotic therapy, history of right facial palsy presenting to the hospital for evaluation of abnormal MRI.  Patient has poor hearing and ongoing illness in his right ear, history was provided by wife at bedside.  Wife states that the patient has had 2 right ear surgeries since June and has had Bell's palsy since July.  For the past 1 week, he has been having double vision in his right eye, as such, he was taken to a neuro-ophthalmologist in [facility] yesterday.  Patient had an MRI performed this morning and after being informed of abnormal results is here for evaluation.  He has not been having any fevers or chills.  His appetite has been reduced for the past few months due to Bell's palsy.  Patient denied having any pain.  Patient was admitted from December 13, 2017 to December 18, 2017 for acute mastoiditis/right otitis media.  PICC line was placed and he was treated with IV antibiotics (ceftriaxone, vancomycin, metronidazole) through January 23, 2018. Assessment & Plan   Skull base osteomyelitis, epidural abscess, sigmoid sinus thrombus -Presented with leukocytosis otherwise hemodynamically stable -MRI showing increased fluid throughout the right mastoid cells now including the petrous apex with extensive surrounding enhancement consistent with petrous apicitis and skull base osteomyelitis.  No regional dural thickening most notable along the clivus and suspected small retroclival epidural abscess.  New thrombus in the right transverse and right sigmoid sinuses. -ENT, Dr. Benjamine Mola,  consulted and appreciated, status post right revision mastoidectomy with drainage of abscess -Neurosurgery also consulted however no role for neurosurgery at this time, Dr. Christella Noa has spoken with Dr. Benjamine Mola -Infectious disease consulted and appreciated and has recommended Merrem -Surgical wound culture: Rare pseudomonas aeruginosa, pansensitive; GNR -Patient noted to have some bleeding around surgical site, and from here.  Discussed with Dr. Benjamine Mola, recommended using your cup with gauze.  Okay to continue anticoagulation.  Chronic atrial fibrillation -CHADSVASC 6  -INR was supratherapeutic on admission, patient was given vitamin K for reversal for surgery -INR currently 1.57 -Discussed with Dr. Benjamine Mola, ok start heparin bridge with coumadin  Right facial palsy -Has had decreased intake since right facial palsy.  Currently undergoing speech therapy as an outpatient.  Physical Deconditioning -PT evaluated patient and recommended SNF -Social work consulted   Diabetes mellitus, type II -Hemoglobin A1c 9.5 in September 2019 -Continue insulin sliding scale with CBG monitoring  Chronic diastolic congestive heart failure -Currently appears to be euvolemic and compensated -Hold home Lasix -Monitor intake and output, daily weights  Essential hypertension -Continue to monitor, currently stable -Lasix held  Coronary artery disease -Currently chest pain-free  Hyperlipidemia -Continue statin  Anxiety -Continue Ativan  Chronic pain -Continue tramadol as needed  DVT Prophylaxis  Heparin/coumadin   Code Status: Full  Family Communication: wife at bedside  Disposition Plan: Admitted. Pending SNF on 02/21/2018  Consultants ENT Infectious disease Neurosurgery  Procedures  Right revision mastoidectomy with drainage of abscess   Antibiotics   Anti-infectives (From admission, onward)   Start     Dose/Rate Route Frequency Ordered Stop   02/17/18 1600  meropenem (MERREM) 2 g in  sodium chloride 0.9 %  100 mL IVPB     2 g 200 mL/hr over 30 Minutes Intravenous Every 8 hours 02/17/18 1344     02/16/18 1100  vancomycin (VANCOCIN) 1,250 mg in sodium chloride 0.9 % 250 mL IVPB  Status:  Discontinued     1,250 mg 166.7 mL/hr over 90 Minutes Intravenous Every 12 hours 02/15/18 2304 02/17/18 1344   02/16/18 0800  ceFEPIme (MAXIPIME) 2 g in sodium chloride 0.9 % 100 mL IVPB  Status:  Discontinued     2 g 200 mL/hr over 30 Minutes Intravenous Every 8 hours 02/15/18 2304 02/17/18 1344   02/15/18 2300  ceFEPIme (MAXIPIME) 2 g in sodium chloride 0.9 % 100 mL IVPB     2 g 200 mL/hr over 30 Minutes Intravenous  Once 02/15/18 2250 02/16/18 1001   02/15/18 2300  vancomycin (VANCOCIN) 2,000 mg in sodium chloride 0.9 % 500 mL IVPB     2,000 mg 250 mL/hr over 120 Minutes Intravenous  Once 02/15/18 2250 02/16/18 1001      Subjective:   [name] seen and examined today.  No complaints this morning.  Denies current chest pain, shortness breath, abdominal pain, nausea or vomiting, diarrhea constipation, dizziness or headache.  Overnight patient did have some altered mental status with hypoxia however this is resolved. Objective:   Vitals:   02/18/18 0016 02/18/18 0416 02/18/18 0816 02/18/18 1220  BP: (!) 125/48 125/79 128/71 (!) 145/76  Pulse: 91 98 92 94  Resp: 16 (!) 26 13 (!) 22  Temp: 98.2 F (36.8 C)   98.7 F (37.1 C)  TempSrc: Oral   Oral  SpO2: 92% 96% 97% 96%  Weight:      Height:        Intake/Output Summary (Last 24 hours) at 02/18/2018 1321 Last data filed at 02/18/2018 0558 Gross per 24 hour  Intake 584.65 ml  Output -  Net 584.65 ml   Filed Weights   02/15/18 1408  Weight: 110.2 kg   Exam  General: Well developed, chronically ill appearing ,NAD  HEENT: NCAT, mucous membranes moist. Right ear with saturated cotton ball and scant blood  Neck: Supple  Cardiovascular: S1 S2 auscultated, RRR, no murmur  Respiratory: Clear to auscultation  bilaterally with equal chest rise  Abdomen: Soft, nontender, nondistended, + bowel sounds  Extremities: warm dry without cyanosis clubbing or edema  Neuro: AAOx3, nonfocal (chronic R sided facial palsy, decreased right eye movement)  Psych: appropriate mood and affect, pleasant  Data Reviewed: I have personally reviewed following labs and imaging studies  CBC: Recent Labs  Lab 02/15/18 1449 02/16/18 0737 02/17/18 0434 02/18/18 0416  WBC 20.5* 17.9* 17.0* 16.4*  NEUTROABS 8.4*  --   --   --   HGB 12.7* 12.1* 12.3* 12.5*  HCT 40.1 39.0 38.8* 39.2  MCV 86.8 85.9 86.2 85.2  PLT 358 310 301 267   Basic Metabolic Panel: Recent Labs  Lab 02/15/18 1449 02/17/18 0434 02/18/18 0416  NA 134* 135 135  K 4.3 4.2 3.8  CL 98 98 97*  CO2 27 24 26   GLUCOSE 79 168* 236*  BUN 28* 21 14  CREATININE 0.78 0.93 0.98  CALCIUM 10.5* 9.2 9.4   GFR: Estimated Creatinine Clearance: 75.8 mL/min (by C-G formula based on SCr of 0.98 mg/dL). Liver Function Tests: No results for input(s): AST, ALT, ALKPHOS, BILITOT, PROT, ALBUMIN in the last 168 hours. No results for input(s): LIPASE, AMYLASE in the last 168 hours. No results for input(s): AMMONIA in the  last 168 hours. Coagulation Profile: Recent Labs  Lab 02/15/18 1449 02/16/18 0428 02/16/18 2009 02/17/18 0434 02/18/18 0416  INR 4.03* 3.11 1.80 1.50 1.57   Cardiac Enzymes: No results for input(s): CKTOTAL, CKMB, CKMBINDEX, TROPONINI in the last 168 hours. BNP (last 3 results) No results for input(s): PROBNP in the last 8760 hours. HbA1C: No results for input(s): HGBA1C in the last 72 hours. CBG: Recent Labs  Lab 02/17/18 1220 02/17/18 1638 02/17/18 2106 02/18/18 0538 02/18/18 1230  GLUCAP 245* 247* 254* 205* 243*   Lipid Profile: No results for input(s): CHOL, HDL, LDLCALC, TRIG, CHOLHDL, LDLDIRECT in the last 72 hours. Thyroid Function Tests: No results for input(s): TSH, T4TOTAL, FREET4, T3FREE, THYROIDAB in the last 72  hours. Anemia Panel: No results for input(s): VITAMINB12, FOLATE, FERRITIN, TIBC, IRON, RETICCTPCT in the last 72 hours. Urine analysis:    Component Value Date/Time   COLORURINE YELLOW 12/14/2017 0050   APPEARANCEUR CLEAR 12/14/2017 0050   LABSPEC 1.008 12/14/2017 0050   PHURINE 6.0 12/14/2017 0050   GLUCOSEU NEGATIVE 12/14/2017 0050   HGBUR NEGATIVE 12/14/2017 0050   BILIRUBINUR NEGATIVE 12/14/2017 0050   KETONESUR NEGATIVE 12/14/2017 0050   PROTEINUR NEGATIVE 12/14/2017 0050   NITRITE NEGATIVE 12/14/2017 0050   LEUKOCYTESUR NEGATIVE 12/14/2017 0050   Sepsis Labs: @LABRCNTIP (procalcitonin:4,lacticidven:4)  ) Recent Results (from the past 240 hour(s))  MRSA PCR Screening     Status: None   Collection Time: 02/15/18 11:36 PM  Result Value Ref Range Status   MRSA by PCR NEGATIVE NEGATIVE Final    Comment:        The GeneXpert MRSA Assay (FDA approved for NASAL specimens only), is one component of a comprehensive MRSA colonization surveillance program. It is not intended to diagnose MRSA infection nor to guide or monitor treatment for MRSA infections. Performed at [facility], [place] [address] [place], [place] [address]   Aerobic/Anaerobic Culture (surgical/deep wound)     Status: None (Preliminary result)   Collection Time: 02/16/18  1:14 AM  Result Value Ref Range Status   Specimen Description WOUND RIGHT EAR  Final   Special Requests NONE  Final   Gram Stain   Final    ABUNDANT WBC PRESENT, PREDOMINANTLY PMN NO ORGANISMS SEEN Performed at [facility], [place] [address] [place], [place] [address]    Culture   Final    RARE PSEUDOMONAS AERUGINOSA NO ANAEROBES ISOLATED; CULTURE IN PROGRESS FOR 5 DAYS    Report Status PENDING  Incomplete   Organism ID, Bacteria PSEUDOMONAS AERUGINOSA  Final      Susceptibility   Pseudomonas aeruginosa - MIC*    CEFTAZIDIME 2 SENSITIVE Sensitive     CIPROFLOXACIN <=0.25 SENSITIVE Sensitive     GENTAMICIN <=1 SENSITIVE  Sensitive     IMIPENEM 1 SENSITIVE Sensitive     PIP/TAZO 8 SENSITIVE Sensitive     CEFEPIME 2 SENSITIVE Sensitive     * RARE PSEUDOMONAS AERUGINOSA  Surgical pcr screen     Status: None   Collection Time: 02/16/18  3:08 PM  Result Value Ref Range Status   MRSA, PCR NEGATIVE NEGATIVE Final   Staphylococcus aureus NEGATIVE NEGATIVE Final    Comment: (NOTE) The Xpert SA Assay (FDA approved for NASAL specimens in patients 68 years of age and older), is one component of a comprehensive surveillance program. It is not intended to diagnose infection nor to guide or monitor treatment. Performed at [facility], [place] [address] [place], [place] [address]  Aerobic/Anaerobic Culture (surgical/deep wound)     Status: None (Preliminary result)   Collection Time: 02/16/18  9:40 PM  Result Value Ref Range Status   Specimen Description WOUND  Final   Special Requests RT MASTOID DRAINAGE  Final   Gram Stain   Final    ABUNDANT WBC PRESENT,BOTH PMN AND MONONUCLEAR NO ORGANISMS SEEN    Culture   Final    FEW GRAM NEGATIVE RODS SUSCEPTIBILITIES TO FOLLOW Performed at [facility], [place] [address], [place], [place] [address]    Report Status PENDING  Incomplete      Radiology Studies: No results found.   Scheduled Meds: . atorvastatin  10 mg Oral QPM  . calcium-vitamin D  2 tablet Oral Q breakfast  . cholecalciferol  1,000 Units Oral q morning - 10a  . clotrimazole  1 application Topical q morning - 10a  . digoxin  0.125 mg Oral Q breakfast  . finasteride  5 mg Oral q morning - 10a  . insulin aspart  0-9 Units Subcutaneous TID WC  . LORazepam  1 mg Oral QHS  . multivitamin with minerals  1 tablet Oral Q breakfast  . omega-3 acid ethyl esters  1 capsule Oral BID  . vitamin C  1,000 mg Oral Q breakfast  . vitamin E  400 Units Oral q morning - 10a  . warfarin  5 mg Oral ONCE-1800  . Warfarin - Pharmacist Dosing Inpatient   Does not apply q1800   Continuous Infusions: .  heparin 1,600 Units/hr (02/18/18 0558)  . lactated ringers 10 mL/hr at 02/17/18 1800  . meropenem (MERREM) IV 2 g (02/18/18 0558)     LOS: 3 days   Time Spent in minutes   30 minutes  Sherald Balbuena D.O. on 02/18/2018 at 1:21 PM  Between 7am to 7pm - Please see pager noted on [email or web address]  After 7pm go to [email or web address]  And look for the night coverage person covering for me after hours  [facility] Office  [phone]

## 2018-02-18 NOTE — Progress Notes (Signed)
ANTICOAGULATION CONSULT NOTE - follow up  Pharmacy Consult for warfarin and heparin Indication: atrial fibrillation  Allergies  Allergen Reactions  . Erythromycin Other (See Comments)  . Penicillins Hives and Other (See Comments)    Has patient had a PCN reaction causing immediate rash, facial/tongue/throat swelling, SOB or lightheadedness with hypotension: No Has patient had a PCN reaction causing severe rash involving mucus membranes or skin necrosis: No Has patient had a PCN reaction that required hospitalization No Has patient had a PCN reaction occurring within the last 10 years: No If all of the above answers are "NO", then may proceed with Cephalosporin use.  Has tolerated Rocephin    Patient Measurements: Height: 6' (182.9 cm) Weight: 243 lb (110.2 kg) IBW/kg (Calculated) : 77.6 Heparin Dosing Weight: 100kg  Vital Signs: Temp: 98.2 F (36.8 C) (11/15 0016) Temp Source: Oral (11/15 0016) BP: 128/71 (11/15 0816) Pulse Rate: 92 (11/15 0816)  Labs: Recent Labs    02/15/18 1449  02/16/18 0737 02/16/18 2009 02/17/18 0434 02/17/18 2015 02/18/18 0416 02/18/18 0632  HGB 12.7*  --  12.1*  --  12.3*  --  12.5*  --   HCT 40.1  --  39.0  --  38.8*  --  39.2  --   PLT 358  --  310  --  301  --  307  --   LABPROT 38.6*   < >  --  20.7* 17.9*  --  18.6*  --   INR 4.03*   < >  --  1.80 1.50  --  1.57  --   HEPARINUNFRC  --   --   --   --   --  0.26*  --  0.49  CREATININE 0.78  --   --   --  0.93  --  0.98  --    < > = values in this interval not displayed.    Estimated Creatinine Clearance: 75.8 mL/min (by C-G formula based on SCr of 0.98 mg/dL).   Medical History: Past Medical History:  Diagnosis Date  . Atrial fibrillation ([place])   . Chronic lymphocytic leukemia ([place])   . Coronary atherosclerosis of native coronary artery    Nonobstructive  . Essential hypertension, benign   . Hyperlipidemia   . IgA nephropathy    Dr. Lowanda Foster  . Type 2 diabetes mellitus ([place])    . UTI (urinary tract infection) july  10 th   taking  med     Medications:  Scheduled:  . atorvastatin  10 mg Oral QPM  . calcium-vitamin D  2 tablet Oral Q breakfast  . cholecalciferol  1,000 Units Oral q morning - 10a  . clotrimazole  1 application Topical q morning - 10a  . digoxin  0.125 mg Oral Q breakfast  . finasteride  5 mg Oral q morning - 10a  . insulin aspart  0-9 Units Subcutaneous TID WC  . LORazepam  1 mg Oral QHS  . multivitamin with minerals  1 tablet Oral Q breakfast  . omega-3 acid ethyl esters  1 capsule Oral BID  . vitamin C  1,000 mg Oral Q breakfast  . vitamin E  400 Units Oral q morning - 10a  . Warfarin - Pharmacist Dosing Inpatient   Does not apply q1800    Assessment: 83 YOM on warfarin PTA for afib, supratherapeutic INR on admission, s/p vit K for surgery.  New thrombus in R-transverse/sigmoid sinuses.  S/p R-mastoidectomy w/drainage. INR 1.5 today and pharmacy consulted to start  heparin and warfarin per primary and ENT.    PTA dose: 5mg  daily  AM heparin level is therapeutic at 0.49 after increase to 1600 units/hr, INR remains subtherapeutic at 1.57 d#2 after restarting warfarin, CBC stable, bleeding from ear noted by RN this AM - evaluated by ENT and primary, plan to continue [facility] as below.  Goal of Therapy:  INR 2-3 Heparin level 0.3-0.7 units/ml Monitor platelets by anticoagulation protocol: Yes   Plan:  Continue heparin gtt at 1600 units/hr Warfarin 5mg  x 1 tonight Daily heparin level, INR, CBC, s/s bleeding  Bertis Ruddy, PharmD Clinical Pharmacist Please check AMION for all [place] numbers 02/18/2018 10:33 AM

## 2018-02-19 ENCOUNTER — Inpatient Hospital Stay (HOSPITAL_COMMUNITY): Payer: Medicare Other

## 2018-02-19 LAB — HEPARIN LEVEL (UNFRACTIONATED): Heparin Unfractionated: 0.45 IU/mL (ref 0.30–0.70)

## 2018-02-19 LAB — GLUCOSE, CAPILLARY
GLUCOSE-CAPILLARY: 220 mg/dL — AB (ref 70–99)
Glucose-Capillary: 122 mg/dL — ABNORMAL HIGH (ref 70–99)
Glucose-Capillary: 132 mg/dL — ABNORMAL HIGH (ref 70–99)
Glucose-Capillary: 192 mg/dL — ABNORMAL HIGH (ref 70–99)

## 2018-02-19 LAB — CBC
HCT: 34.9 % — ABNORMAL LOW (ref 39.0–52.0)
HEMOGLOBIN: 11.2 g/dL — AB (ref 13.0–17.0)
MCH: 27.6 pg (ref 26.0–34.0)
MCHC: 32.1 g/dL (ref 30.0–36.0)
MCV: 86 fL (ref 80.0–100.0)
Platelets: 264 10*3/uL (ref 150–400)
RBC: 4.06 MIL/uL — AB (ref 4.22–5.81)
RDW: 14.6 % (ref 11.5–15.5)
WBC: 16.7 10*3/uL — ABNORMAL HIGH (ref 4.0–10.5)
nRBC: 0 % (ref 0.0–0.2)

## 2018-02-19 LAB — PROTIME-INR
INR: 1.99
Prothrombin Time: 22.3 seconds — ABNORMAL HIGH (ref 11.4–15.2)

## 2018-02-19 MED ORDER — SODIUM CHLORIDE 0.9% FLUSH: 10.0000 mL | INTRAVENOUS | Status: DC | PRN

## 2018-02-19 MED ORDER — BISACODYL 5 MG PO TBEC
5.0000 mg | DELAYED_RELEASE_TABLET | Freq: Every day | ORAL | Status: DC | PRN
  Administered 2018-02-20: 5 mg via ORAL
  Filled 2018-02-19: qty 1

## 2018-02-19 MED ORDER — SODIUM CHLORIDE 0.9% FLUSH
10.0000 mL | Freq: Two times a day (BID) | INTRAVENOUS | Status: DC
  Administered 2018-02-19 – 2018-02-20 (×3): 10 mL

## 2018-02-19 MED ORDER — BISACODYL 10 MG RE SUPP
10.0000 mg | Freq: Every day | RECTAL | Status: DC | PRN
  Administered 2018-02-19: 10 mg via RECTAL
  Filled 2018-02-19: qty 1

## 2018-02-19 MED ORDER — WARFARIN SODIUM 5 MG PO TABS
5.0000 mg | ORAL_TABLET | Freq: Once | ORAL | Status: AC
  Administered 2018-02-19: 5 mg via ORAL
  Filled 2018-02-19: qty 1

## 2018-02-19 NOTE — Progress Notes (Signed)
PROGRESS NOTE    [name]  AXE:[id number] DOB: 06-30-1934 DOA: 02/15/2018 PCP: [name], MD   Brief Narrative:  HPI On 02/15/2018 by Dr. Shela Leff [name] [name] is a 83 y.o. male with medical history significant of chronic A. fib, CLL, coronary artery disease, hypertension, hyperlipidemia, IgA nephropathy, type 2 diabetes, history of recurrent mastoiditis, prior partial excision, ongoing topical antibiotic therapy, history of right facial palsy presenting to the hospital for evaluation of abnormal MRI.  Patient has poor hearing and ongoing illness in his right ear, history was provided by wife at bedside.  Wife states that the patient has had 2 right ear surgeries since June and has had Bell's palsy since July.  For the past 1 week, he has been having double vision in his right eye, as such, he was taken to a neuro-ophthalmologist in [facility] yesterday.  Patient had an MRI performed this morning and after being informed of abnormal results is here for evaluation.  He has not been having any fevers or chills.  His appetite has been reduced for the past few months due to Bell's palsy.  Patient denied having any pain.  Patient was admitted from December 13, 2017 to December 18, 2017 for acute mastoiditis/right otitis media.  PICC line was placed and he was treated with IV antibiotics (ceftriaxone, vancomycin, metronidazole) through January 23, 2018.  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  ID also consulted for antibiotics, currently on Merrem.  Assessment & Plan   Skull base osteomyelitis, epidural abscess, sigmoid sinus thrombus -Presented with leukocytosis otherwise hemodynamically stable -MRI showing increased fluid throughout the right mastoid cells now including the petrous apex with extensive surrounding enhancement consistent with petrous apicitis and skull base  osteomyelitis.  No regional dural thickening most notable along the clivus and suspected small retroclival epidural abscess.  New thrombus in the right transverse and right sigmoid sinuses. -ENT, Dr. Benjamine Mola, consulted and appreciated, status post right revision mastoidectomy with drainage of abscess -Neurosurgery also consulted however no role for neurosurgery at this time, Dr. Christella Noa has spoken with Dr. Benjamine Mola -Infectious disease consulted and appreciated and has recommended Merrem -Surgical wound culture: Rare pseudomonas aeruginosa, pansensitive; GNR -Patient noted to have some bleeding around surgical site/ear.  Discussed with Dr. Benjamine Mola, recommended using your cup with gauze.  Okay to continue anticoagulation. -PICC line ordered  Chronic atrial fibrillation -CHADSVASC 6  -INR was supratherapeutic on admission, patient was given vitamin K for reversal for surgery -INR currently 1.99 -Continue coumadin   Right facial palsy -Has had decreased intake since right facial palsy.  Currently undergoing speech therapy as an outpatient.  Physical Deconditioning -PT evaluated patient and recommended SNF -Social work consulted   Diabetes mellitus, type II -Hemoglobin A1c 9.5 in September 2019 -Continue insulin sliding scale with CBG monitoring  Chronic diastolic congestive heart failure -Currently appears to be euvolemic and compensated -Hold home Lasix -Monitor intake and output, daily weights  Essential hypertension -Continue to monitor, currently stable -Lasix held  Coronary artery disease -Currently chest pain-free  Hyperlipidemia -Continue statin  Anxiety -Continue Ativan  Chronic pain -Continue tramadol as needed  DVT Prophylaxis Coumadin   Code Status: Full  Family Communication: Family at bedside  Disposition Plan: Admitted. Pending SNF on 02/21/2018  Consultants ENT Infectious disease Neurosurgery  Procedures  Right revision mastoidectomy with drainage of  abscess   Antibiotics   Anti-infectives (From admission, onward)  Start     Dose/Rate Route Frequency Ordered Stop   02/17/18 1600  meropenem (MERREM) 2 g in sodium chloride 0.9 % 100 mL IVPB     2 g 200 mL/hr over 30 Minutes Intravenous Every 8 hours 02/17/18 1344     02/16/18 1100  vancomycin (VANCOCIN) 1,250 mg in sodium chloride 0.9 % 250 mL IVPB  Status:  Discontinued     1,250 mg 166.7 mL/hr over 90 Minutes Intravenous Every 12 hours 02/15/18 2304 02/17/18 1344   02/16/18 0800  ceFEPIme (MAXIPIME) 2 g in sodium chloride 0.9 % 100 mL IVPB  Status:  Discontinued     2 g 200 mL/hr over 30 Minutes Intravenous Every 8 hours 02/15/18 2304 02/17/18 1344   02/15/18 2300  ceFEPIme (MAXIPIME) 2 g in sodium chloride 0.9 % 100 mL IVPB     2 g 200 mL/hr over 30 Minutes Intravenous  Once 02/15/18 2250 02/16/18 1001   02/15/18 2300  vancomycin (VANCOCIN) 2,000 mg in sodium chloride 0.9 % 500 mL IVPB     2,000 mg 250 mL/hr over 120 Minutes Intravenous  Once 02/15/18 2250 02/16/18 1001      Subjective:   [name] [name] seen and examined today.  Feels he is constipated today.  Denies current chest pain, shortness breath, abdominal pain, nausea or vomiting, diarrhea, dizziness or headache.  Objective:   Vitals:   02/18/18 1615 02/18/18 2329 02/19/18 0322 02/19/18 0828  BP: (!) 149/68 133/69 (!) 117/56 (!) 135/57  Pulse: 81 82 91 89  Resp: 15 16 19 17   Temp:  98.3 F (36.8 C) 98.4 F (36.9 C) 98 F (36.7 C)  TempSrc:  Oral Oral Oral  SpO2: 96% 97% 96% 96%  Weight:      Height:        Intake/Output Summary (Last 24 hours) at 02/19/2018 0912 Last data filed at 02/19/2018 0541 Gross per 24 hour  Intake 1481.48 ml  Output -  Net 1481.48 ml   Filed Weights   02/15/18 1408  Weight: 110.2 kg   Exam  General: Well developed, chronically ill-appearing, NAD  HEENT: NCAT, mucous membranes moist.  Right eye patch in place.  Right ear cup in place.  Neck: Supple  Cardiovascular: S1  S2 auscultated, no rubs, murmurs or gallops. Regular rate and rhythm.  Respiratory: Clear to auscultation bilaterally with equal chest rise  Abdomen: Soft, nontender, nondistended, + bowel sounds  Extremities: warm dry without cyanosis clubbing or edema  Neuro: AAOx3, hard of hearing, chronic right-sided facial palsy with decreased right eye movement, otherwise nonfocal  Psych: Pleasant, appropriate mood and affect Data Reviewed: I have personally reviewed following labs and imaging studies  CBC: Recent Labs  Lab 02/15/18 1449 02/16/18 0737 02/17/18 0434 02/18/18 0416 02/19/18 0454  WBC 20.5* 17.9* 17.0* 16.4* 16.7*  NEUTROABS 8.4*  --   --   --   --   HGB 12.7* 12.1* 12.3* 12.5* 11.2*  HCT 40.1 39.0 38.8* 39.2 34.9*  MCV 86.8 85.9 86.2 85.2 86.0  PLT 358 310 301 307 242   Basic Metabolic Panel: Recent Labs  Lab 02/15/18 1449 02/17/18 0434 02/18/18 0416  NA 134* 135 135  K 4.3 4.2 3.8  CL 98 98 97*  CO2 27 24 26   GLUCOSE 79 168* 236*  BUN 28* 21 14  CREATININE 0.78 0.93 0.98  CALCIUM 10.5* 9.2 9.4   GFR: Estimated Creatinine Clearance: 75.8 mL/min (by C-G formula based on SCr of 0.98 mg/dL). Liver Function Tests:  No results for input(s): AST, ALT, ALKPHOS, BILITOT, PROT, ALBUMIN in the last 168 hours. No results for input(s): LIPASE, AMYLASE in the last 168 hours. No results for input(s): AMMONIA in the last 168 hours. Coagulation Profile: Recent Labs  Lab 02/16/18 0428 02/16/18 2009 02/17/18 0434 02/18/18 0416 02/19/18 0454  INR 3.11 1.80 1.50 1.57 1.99   Cardiac Enzymes: No results for input(s): CKTOTAL, CKMB, CKMBINDEX, TROPONINI in the last 168 hours. BNP (last 3 results) No results for input(s): PROBNP in the last 8760 hours. HbA1C: No results for input(s): HGBA1C in the last 72 hours. CBG: Recent Labs  Lab 02/18/18 0538 02/18/18 1230 02/18/18 1729 02/18/18 2128 02/19/18 0628  GLUCAP 205* 243* 259* 248* 122*   Lipid Profile: No results  for input(s): CHOL, HDL, LDLCALC, TRIG, CHOLHDL, LDLDIRECT in the last 72 hours. Thyroid Function Tests: No results for input(s): TSH, T4TOTAL, FREET4, T3FREE, THYROIDAB in the last 72 hours. Anemia Panel: No results for input(s): VITAMINB12, FOLATE, FERRITIN, TIBC, IRON, RETICCTPCT in the last 72 hours. Urine analysis:    Component Value Date/Time   COLORURINE YELLOW 12/14/2017 0050   APPEARANCEUR CLEAR 12/14/2017 0050   LABSPEC 1.008 12/14/2017 0050   PHURINE 6.0 12/14/2017 0050   GLUCOSEU NEGATIVE 12/14/2017 0050   HGBUR NEGATIVE 12/14/2017 0050   BILIRUBINUR NEGATIVE 12/14/2017 0050   KETONESUR NEGATIVE 12/14/2017 0050   PROTEINUR NEGATIVE 12/14/2017 0050   NITRITE NEGATIVE 12/14/2017 0050   LEUKOCYTESUR NEGATIVE 12/14/2017 0050   Sepsis Labs: @LABRCNTIP (procalcitonin:4,lacticidven:4)  ) Recent Results (from the past 240 hour(s))  MRSA PCR Screening     Status: None   Collection Time: 02/15/18 11:36 PM  Result Value Ref Range Status   MRSA by PCR NEGATIVE NEGATIVE Final    Comment:        The GeneXpert MRSA Assay (FDA approved for NASAL specimens only), is one component of a comprehensive MRSA colonization surveillance program. It is not intended to diagnose MRSA infection nor to guide or monitor treatment for MRSA infections. Performed at [facility], [place] [address], [place], [place] [address]   Aerobic/Anaerobic Culture (surgical/deep wound)     Status: None (Preliminary result)   Collection Time: 02/16/18  1:14 AM  Result Value Ref Range Status   Specimen Description WOUND RIGHT EAR  Final   Special Requests NONE  Final   Gram Stain   Final    ABUNDANT WBC PRESENT, PREDOMINANTLY PMN NO ORGANISMS SEEN Performed at [facility], [place] [address], [place], [place] [address]    Culture   Final    RARE PSEUDOMONAS AERUGINOSA NO ANAEROBES ISOLATED; CULTURE IN PROGRESS FOR 5 DAYS    Report Status PENDING  Incomplete   Organism ID, Bacteria  PSEUDOMONAS AERUGINOSA  Final      Susceptibility   Pseudomonas aeruginosa - MIC*    CEFTAZIDIME 2 SENSITIVE Sensitive     CIPROFLOXACIN <=0.25 SENSITIVE Sensitive     GENTAMICIN <=1 SENSITIVE Sensitive     IMIPENEM 1 SENSITIVE Sensitive     PIP/TAZO 8 SENSITIVE Sensitive     CEFEPIME 2 SENSITIVE Sensitive     * RARE PSEUDOMONAS AERUGINOSA  Surgical pcr screen     Status: None   Collection Time: 02/16/18  3:08 PM  Result Value Ref Range Status   MRSA, PCR NEGATIVE NEGATIVE Final   Staphylococcus aureus NEGATIVE NEGATIVE Final    Comment: (NOTE) The Xpert SA Assay (FDA approved for NASAL specimens in patients 62 years of age and older), is  one component of a comprehensive surveillance program. It is not intended to diagnose infection nor to guide or monitor treatment. Performed at [facility], [place] [address], [place], [place] [address]   Fungus Culture With Stain     Status: None (Preliminary result)   Collection Time: 02/16/18  9:40 PM  Result Value Ref Range Status   Fungus Stain Final report  Final    Comment: (NOTE) Performed At: [facility] [place], [place] [id number] Rush Farmer MD [id number]    Fungus (Mycology) Culture PENDING  Incomplete   Fungal Source WOUND  Final    Comment: RT MASTOI D DRAINAGE  Aerobic/Anaerobic Culture (surgical/deep wound)     Status: None (Preliminary result)   Collection Time: 02/16/18  9:40 PM  Result Value Ref Range Status   Specimen Description WOUND  Final   Special Requests RT MASTOID DRAINAGE  Final   Gram Stain   Final    ABUNDANT WBC PRESENT,BOTH PMN AND MONONUCLEAR NO ORGANISMS SEEN    Culture   Final    FEW PSEUDOMONAS AERUGINOSA SUSCEPTIBILITIES TO FOLLOW Performed at [facility], [place] [address], [place], [place] [address]    Report Status PENDING  Incomplete  Fungus Culture Result     Status: None   Collection Time: 02/16/18  9:40 PM  Result Value Ref Range Status   Result 1  Comment  Final    Comment: (NOTE) KOH/Calcofluor preparation:  no fungus observed. Performed At: [facility] [place], [place] [id number] Rush Farmer MD FE:[id number]       Radiology Studies: [place] Ekg Site Rite  Result Date: 02/18/2018 If Site Rite image not attached, placement could not be confirmed due to current cardiac rhythm.    Scheduled Meds: . atorvastatin  10 mg Oral QPM  . calcium-vitamin D  2 tablet Oral Q breakfast  . cholecalciferol  1,000 Units Oral q morning - 10a  . clotrimazole  1 application Topical q morning - 10a  . digoxin  0.125 mg Oral Q breakfast  . feeding supplement (ENSURE ENLIVE)  237 mL Oral BID BM  . finasteride  5 mg Oral q morning - 10a  . insulin aspart  0-9 Units Subcutaneous TID WC  . insulin detemir  20 Units Subcutaneous Daily  . LORazepam  1 mg Oral QHS  . multivitamin with minerals  1 tablet Oral Q breakfast  . omega-3 acid ethyl esters  1 capsule Oral BID  . vitamin C  1,000 mg Oral Q breakfast  . vitamin E  400 Units Oral q morning - 10a  . warfarin  5 mg Oral ONCE-1800  . Warfarin - Pharmacist Dosing Inpatient   Does not apply q1800   Continuous Infusions: . lactated ringers 10 mL/hr at 02/19/18 0541  . meropenem (MERREM) IV 2 g (02/19/18 0550)     LOS: 4 days   Time Spent in minutes   30 minutes  Boomer Winders D.O. on 02/19/2018 at 9:12 AM  Between 7am to 7pm - Please see pager noted on [email or web address]  After 7pm go to [email or web address]  And look for the night coverage person covering for me after hours  [facility] Office  [phone]

## 2018-02-19 NOTE — Progress Notes (Signed)
Patient still draining blood on his R ear, changed gauzes severally to prevent it from saturating his ear, denied any pain pain, wife at bedside.

## 2018-02-19 NOTE — Progress Notes (Signed)
Peripherally Inserted Central Catheter/Midline Placement  The IV Nurse has discussed with the patient and/or persons authorized to consent for the patient, the purpose of this procedure and the potential benefits and risks involved with this procedure.  The benefits include less needle sticks, lab draws from the catheter, and the patient may be discharged home with the catheter. Risks include, but not limited to, infection, bleeding, blood clot (thrombus formation), and puncture of an artery; nerve damage and irregular heartbeat and possibility to perform a PICC exchange if needed/ordered by physician.  Alternatives to this procedure were also discussed.  Bard Power PICC patient education guide, fact sheet on infection prevention and patient information card has been provided to patient /or left at bedside.    PICC/Midline Placement Documentation  PICC Single Lumen 03/24/74 PICC Right Basilic 43 cm 0 cm (Active)  Indication for Insertion or Continuance of Line Home intravenous therapies (PICC only) 02/19/2018 11:10 AM  Exposed Catheter (cm) 0 cm 02/19/2018 11:10 AM  Site Assessment Clean;Dry;Intact 02/19/2018 11:10 AM  Line Status Saline locked;Flushed;Blood return noted 02/19/2018 11:10 AM  Dressing Type Transparent 02/19/2018 11:10 AM  Dressing Status Clean;Dry;Intact;Antimicrobial disc in place 02/19/2018 11:10 AM  Line Care Connections checked and tightened 02/19/2018 11:10 AM  Line Adjustment (NICU/IV Team Only) No 02/19/2018 11:10 AM  Dressing Intervention New dressing 02/19/2018 11:10 AM  Dressing Change Due 02/26/18 02/19/2018 11:10 AM       Rolena Infante 02/19/2018, 11:12 AM

## 2018-02-19 NOTE — Progress Notes (Signed)
ANTICOAGULATION CONSULT NOTE - follow up  Pharmacy Consult for warfarin and heparin Indication: atrial fibrillation  Allergies  Allergen Reactions  . Erythromycin Other (See Comments)  . Penicillins Hives and Other (See Comments)    Has patient had a PCN reaction causing immediate rash, facial/tongue/throat swelling, SOB or lightheadedness with hypotension: No Has patient had a PCN reaction causing severe rash involving mucus membranes or skin necrosis: No Has patient had a PCN reaction that required hospitalization No Has patient had a PCN reaction occurring within the last 10 years: No If all of the above answers are "NO", then may proceed with Cephalosporin use.  Has tolerated Rocephin    Patient Measurements: Height: 6' (182.9 cm) Weight: 243 lb (110.2 kg) IBW/kg (Calculated) : 77.6 Heparin Dosing Weight: 100kg  Vital Signs: Temp: 98.4 F (36.9 C) (11/16 0322) Temp Source: Oral (11/16 0322) BP: 117/56 (11/16 0322) Pulse Rate: 91 (11/16 0322)  Labs: Recent Labs    02/17/18 0434 02/17/18 2015 02/18/18 0416 02/18/18 0632 02/19/18 0454  HGB 12.3*  --  12.5*  --  11.2*  HCT 38.8*  --  39.2  --  34.9*  PLT 301  --  307  --  264  LABPROT 17.9*  --  18.6*  --  22.3*  INR 1.50  --  1.57  --  1.99  HEPARINUNFRC  --  0.26*  --  0.49 0.45  CREATININE 0.93  --  0.98  --   --     Estimated Creatinine Clearance: 75.8 mL/min (by C-G formula based on SCr of 0.98 mg/dL).   Medical History: Past Medical History:  Diagnosis Date  . Atrial fibrillation ([place])   . Chronic lymphocytic leukemia ([place])   . Coronary atherosclerosis of native coronary artery    Nonobstructive  . Essential hypertension, benign   . Hyperlipidemia   . IgA nephropathy    Dr. Lowanda Foster  . Type 2 diabetes mellitus ([place])   . UTI (urinary tract infection) july  10 th   taking  med     Medications:  Scheduled:  . atorvastatin  10 mg Oral QPM  . calcium-vitamin D  2 tablet Oral Q breakfast  .  cholecalciferol  1,000 Units Oral q morning - 10a  . clotrimazole  1 application Topical q morning - 10a  . digoxin  0.125 mg Oral Q breakfast  . feeding supplement (ENSURE ENLIVE)  237 mL Oral BID BM  . finasteride  5 mg Oral q morning - 10a  . insulin aspart  0-9 Units Subcutaneous TID WC  . insulin detemir  20 Units Subcutaneous Daily  . LORazepam  1 mg Oral QHS  . multivitamin with minerals  1 tablet Oral Q breakfast  . omega-3 acid ethyl esters  1 capsule Oral BID  . vitamin C  1,000 mg Oral Q breakfast  . vitamin E  400 Units Oral q morning - 10a  . Warfarin - Pharmacist Dosing Inpatient   Does not apply q1800    Assessment: 83 YOM on warfarin PTA for afib, supratherapeutic INR on admission, s/p vit K for surgery.  New thrombus in R-transverse/sigmoid sinuses.  S/p R-mastoidectomy w/drainage. On 11/15 pharmacy consulted to start heparin and warfarin per primary and ENT.     PTA warfarin dose: 5mg  daily  AM heparin level today is therapeutic at 0.45 on heparin gtt at 1600 units/hr, INR remains barely subtherapeutic at 1.99. CBC stable, with hgb 11.2, pltc 264. RN today notes that bleeding from ear is  continuous, pt was evaluated yesterday by ENT and primary, plan to continue [facility] as below. Will stop heparin today with INR 1.99 and give his PTA coumadin dose tonight.  Goal of Therapy:  INR 2-3 Heparin level 0.3-0.7 units/ml Monitor platelets by anticoagulation protocol: Yes   Plan:  Will stop heparin today per discussion with MD. Warfarin 5mg  x 1 tonight Daily heparin level, INR, CBC, s/s bleeding  Janae Bridgeman, PharmD PGY1 Pharmacy Resident Phone: [phone] 02/19/2018 8:34 AM

## 2018-02-19 NOTE — Progress Notes (Signed)
Subjective: Pt resting comfortably in bed. Awake and responsive.  Objective: Vital signs in last 24 hours: Temp:  [98.3 F (36.8 C)-98.7 F (37.1 C)] 98.4 F (36.9 C) (11/16 0322) Pulse Rate:  [81-94] 91 (11/16 0322) Resp:  [15-22] 19 (11/16 0322) BP: (117-149)/(56-76) 117/56 (11/16 0322) SpO2:  [96 %-97 %] 96 % (11/16 0322)  Physical Exam Constitutional: He isalert and responsive.No distress.  Head:Normocephalicand atraumatic. Right CN 7 palsy. Right Ear: Dressing replaced. Incision c/d/i. Blood in mastoid cavity. Left Ear: Normal auricle and EAC. No drainage. Eyes:  Right eye exhibitsabnormal extraocular motionwith 6th nerve palsy.Right pupil isreactive. Left pupil isreactive. Nose: Normal mucosa, septum, and turbinates. Mouth: Normal mucosa. Cardiovascular:Normal rateand regular rhythm.  Pulmonary/Chest:Effort normal. Nostridor. Norespiratory distress.  Neurological:Right cranial nerve 6 and 7 palsy. Skin: Skin iswarmand dry.  Nursing noteand vitalsreviewed.   Recent Labs    02/18/18 0416 02/19/18 0454  WBC 16.4* 16.7*  HGB 12.5* 11.2*  HCT 39.2 34.9*  PLT 307 264   Recent Labs    02/17/18 0434 02/18/18 0416  NA 135 135  K 4.2 3.8  CL 98 97*  CO2 24 26  GLUCOSE 168* 236*  BUN 21 14  CREATININE 0.93 0.98  CALCIUM 9.2 9.4    Medications:  I have reviewed the patient's current medications. Scheduled: . atorvastatin  10 mg Oral QPM  . calcium-vitamin D  2 tablet Oral Q breakfast  . cholecalciferol  1,000 Units Oral q morning - 10a  . clotrimazole  1 application Topical q morning - 10a  . digoxin  0.125 mg Oral Q breakfast  . feeding supplement (ENSURE ENLIVE)  237 mL Oral BID BM  . finasteride  5 mg Oral q morning - 10a  . insulin aspart  0-9 Units Subcutaneous TID WC  . insulin detemir  20 Units Subcutaneous Daily  . LORazepam  1 mg Oral QHS  . multivitamin with minerals  1 tablet Oral Q breakfast  . omega-3 acid ethyl esters  1  capsule Oral BID  . vitamin C  1,000 mg Oral Q breakfast  . vitamin E  400 Units Oral q morning - 10a  . Warfarin - Pharmacist Dosing Inpatient   Does not apply q1800   Continuous: . heparin 1,600 Units/hr (02/19/18 0541)  . lactated ringers 10 mL/hr at 02/19/18 0541  . meropenem (MERREM) IV 2 g (02/19/18 0550)    Assessment/Plan: Right ear otomastoiditis, malignant otitis externa, and skull base osteomyelitis. - POD #3 s/p revision mastoidectomy and drainage of abscess. - Dressing reapplied. - Anticoagulation restarted. - Will need long term IV abx.    LOS: 4 days   Graeme Menees W Adolph Clutter 02/19/2018, 8:27 AM

## 2018-02-19 NOTE — Progress Notes (Signed)
Pt's wife signed consent for PICC placement. Pt and family request to allow pt to eat breakfast while it is hot.  Will check back later.

## 2018-02-20 LAB — BASIC METABOLIC PANEL
ANION GAP: 7 (ref 5–15)
BUN: 13 mg/dL (ref 8–23)
CALCIUM: 9 mg/dL (ref 8.9–10.3)
CO2: 30 mmol/L (ref 22–32)
Chloride: 101 mmol/L (ref 98–111)
Creatinine, Ser: 0.75 mg/dL (ref 0.61–1.24)
GFR calc Af Amer: >60 mL/min (ref >60)
Glucose, Bld: 205 mg/dL — ABNORMAL HIGH (ref 70–99)
Potassium: 3.5 mmol/L (ref 3.5–5.1)
Sodium: 138 mmol/L (ref 135–145)

## 2018-02-20 LAB — GLUCOSE, CAPILLARY
GLUCOSE-CAPILLARY: 243 mg/dL — AB (ref 70–99)
Glucose-Capillary: 188 mg/dL — ABNORMAL HIGH (ref 70–99)
Glucose-Capillary: 226 mg/dL — ABNORMAL HIGH (ref 70–99)
Glucose-Capillary: 244 mg/dL — ABNORMAL HIGH (ref 70–99)

## 2018-02-20 LAB — CBC
HEMATOCRIT: 34.5 % — AB (ref 39.0–52.0)
Hemoglobin: 10.5 g/dL — ABNORMAL LOW (ref 13.0–17.0)
MCH: 26.4 pg (ref 26.0–34.0)
MCHC: 30.4 g/dL (ref 30.0–36.0)
MCV: 86.7 fL (ref 80.0–100.0)
NRBC: 0 % (ref 0.0–0.2)
PLATELETS: 269 10*3/uL (ref 150–400)
RBC: 3.98 MIL/uL — ABNORMAL LOW (ref 4.22–5.81)
RDW: 14.6 % (ref 11.5–15.5)
WBC: 14.9 10*3/uL — ABNORMAL HIGH (ref 4.0–10.5)

## 2018-02-20 LAB — PROTIME-INR
INR: 2.13
PROTHROMBIN TIME: 23.5 s — AB (ref 11.4–15.2)

## 2018-02-20 MED ORDER — WARFARIN SODIUM 5 MG PO TABS
5.0000 mg | ORAL_TABLET | Freq: Once | ORAL | Status: AC
  Administered 2018-02-20: 5 mg via ORAL
  Filled 2018-02-20: qty 1

## 2018-02-20 NOTE — Progress Notes (Signed)
ANTICOAGULATION CONSULT NOTE - follow up  Pharmacy Consult for warfarin and heparin Indication: atrial fibrillation  Allergies  Allergen Reactions  . Erythromycin Other (See Comments)  . Penicillins Hives and Other (See Comments)    Has patient had a PCN reaction causing immediate rash, facial/tongue/throat swelling, SOB or lightheadedness with hypotension: No Has patient had a PCN reaction causing severe rash involving mucus membranes or skin necrosis: No Has patient had a PCN reaction that required hospitalization No Has patient had a PCN reaction occurring within the last 10 years: No If all of the above answers are "NO", then may proceed with Cephalosporin use.  Has tolerated Rocephin    Patient Measurements: Height: 6' (182.9 cm) Weight: 243 lb (110.2 kg) IBW/kg (Calculated) : 77.6 Heparin Dosing Weight: 100kg  Vital Signs: Temp: 97.8 F (36.6 C) (11/17 0802) Temp Source: Axillary (11/17 0802) BP: 112/68 (11/17 0802) Pulse Rate: 84 (11/17 0802)  Labs: Recent Labs    02/17/18 2015  02/18/18 0416 02/18/18 0632 02/19/18 0454 02/20/18 0604  HGB  --    < > 12.5*  --  11.2* 10.5*  HCT  --   --  39.2  --  34.9* 34.5*  PLT  --   --  307  --  264 269  LABPROT  --   --  18.6*  --  22.3* 23.5*  INR  --   --  1.57  --  1.99 2.13  HEPARINUNFRC 0.26*  --   --  0.49 0.45  --   CREATININE  --   --  0.98  --   --  0.75   < > = values in this interval not displayed.    Estimated Creatinine Clearance: 92.8 mL/min (by C-G formula based on SCr of 0.75 mg/dL).   Medical History: Past Medical History:  Diagnosis Date  . Atrial fibrillation ([place])   . Chronic lymphocytic leukemia ([place])   . Coronary atherosclerosis of native coronary artery    Nonobstructive  . Essential hypertension, benign   . Hyperlipidemia   . IgA nephropathy    Dr. Lowanda Foster  . Type 2 diabetes mellitus ([place])   . UTI (urinary tract infection) july  10 th   taking  med     Medications:  Scheduled:  .  atorvastatin  10 mg Oral QPM  . calcium-vitamin D  2 tablet Oral Q breakfast  . cholecalciferol  1,000 Units Oral q morning - 10a  . clotrimazole  1 application Topical q morning - 10a  . digoxin  0.125 mg Oral Q breakfast  . feeding supplement (ENSURE ENLIVE)  237 mL Oral BID BM  . finasteride  5 mg Oral q morning - 10a  . insulin aspart  0-9 Units Subcutaneous TID WC  . insulin detemir  20 Units Subcutaneous Daily  . LORazepam  1 mg Oral QHS  . multivitamin with minerals  1 tablet Oral Q breakfast  . omega-3 acid ethyl esters  1 capsule Oral BID  . sodium chloride flush  10-40 mL Intracatheter Q12H  . vitamin C  1,000 mg Oral Q breakfast  . vitamin E  400 Units Oral q morning - 10a  . Warfarin - Pharmacist Dosing Inpatient   Does not apply q1800    Assessment: 83 YOM on warfarin PTA for afib, supratherapeutic INR on admission, s/p vit K for surgery.  New thrombus in R-transverse/sigmoid sinuses.  S/p R-mastoidectomy w/drainage. On 11/15 pharmacy consulted to start heparin and warfarin per primary and ENT.  Heparin gtt DC on 11/16.  PTA warfarin dose: 5mg  daily  11/17: AM INR today is therapeutic at 2.13 which is up from 1.99 yesterday. Will continue to dose PTA regimen. Hgb 10.5 and pltc 269; stable. Continue to trend CBC given recent R ear bleeding.  Goal of Therapy:  INR 2-3 Heparin level 0.3-0.7 units/ml Monitor platelets by anticoagulation protocol: Yes   Plan:  Continue PTA Warfarin 5mg  x 1 tonight Daily heparin level, INR, CBC, s/s bleeding  Thank you for involving pharmacy in this patient's care.  Janae Bridgeman, PharmD PGY1 Pharmacy Resident Phone: [phone] 02/20/2018 9:56 AM

## 2018-02-20 NOTE — Progress Notes (Signed)
PROGRESS NOTE    [name] [name]  CXK:[id number] DOB: 02-20-1935 DOA: 02/15/2018 PCP: [name], MD   Brief Narrative:  HPI On 02/15/2018 by Dr. Shela Leff [name] [name] is a 83 y.o. male with medical history significant of chronic A. fib, CLL, coronary artery disease, hypertension, hyperlipidemia, IgA nephropathy, type 2 diabetes, history of recurrent mastoiditis, prior partial excision, ongoing topical antibiotic therapy, history of right facial palsy presenting to the hospital for evaluation of abnormal MRI.  Patient has poor hearing and ongoing illness in his right ear, history was provided by wife at bedside.  Wife states that the patient has had 2 right ear surgeries since June and has had Bell's palsy since July.  For the past 1 week, he has been having double vision in his right eye, as such, he was taken to a neuro-ophthalmologist in [facility] yesterday.  Patient had an MRI performed this morning and after being informed of abnormal results is here for evaluation.  He has not been having any fevers or chills.  His appetite has been reduced for the past few months due to Bell's palsy.  Patient denied having any pain.  Patient was admitted from December 13, 2017 to December 18, 2017 for acute mastoiditis/right otitis media.  PICC line was placed and he was treated with IV antibiotics (ceftriaxone, vancomycin, metronidazole) through January 23, 2018.  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  ID also consulted for antibiotics, currently on Merrem.  Assessment & Plan   Skull base osteomyelitis, epidural abscess, sigmoid sinus thrombus -Presented with leukocytosis otherwise hemodynamically stable -MRI showing increased fluid throughout the right mastoid cells now including the petrous apex with extensive surrounding enhancement consistent with petrous apicitis and skull base  osteomyelitis.  No regional dural thickening most notable along the clivus and suspected small retroclival epidural abscess.  New thrombus in the right transverse and right sigmoid sinuses. -ENT, Dr. Benjamine Mola, consulted and appreciated, status post right revision mastoidectomy with drainage of abscess -Neurosurgery also consulted however no role for neurosurgery at this time, Dr. Christella Noa has spoken with Dr. Benjamine Mola -Infectious disease consulted and appreciated and has recommended Merrem -Surgical wound culture: Rare pseudomonas aeruginosa, pansensitive; GNR -Patient noted to have some bleeding around surgical site/ear.  Discussed with Dr. Benjamine Mola, recommended using your cup with gauze.  Okay to continue anticoagulation. -PICC line placed  Chronic atrial fibrillation -CHADSVASC 6  -INR was supratherapeutic on admission, patient was given vitamin K for reversal for surgery -INR currently 2.13 -heparin discontinued on 11/16/219, continue coumadin   Right facial palsy -Has had decreased intake since right facial palsy.  Currently undergoing speech therapy as an outpatient.  Physical Deconditioning -PT evaluated patient and recommended SNF -Social work consulted   Diabetes mellitus, type II -Hemoglobin A1c 9.5 in September 2019 -Continue insulin sliding scale with CBG monitoring  Chronic diastolic congestive heart failure -Currently appears to be euvolemic and compensated -Hold home Lasix -Monitor intake and output, daily weights  Essential hypertension -Continue to monitor, currently stable -Lasix held  Coronary artery disease -Currently chest pain-free  Hyperlipidemia -Continue statin  Anxiety -Continue Ativan  Chronic pain -Continue tramadol as needed  Constipation -resolved with bowel regimen  Hypoxia -occurs at night when patient is sleeping- oxygen saturations drop -patient is a mouth breather -discussed with wife at bedside and recommended overnight pulse ox or sleep study  as an outpatient    DVT Prophylaxis Coumadin  Code Status: Full  Family Communication: Wife at bedside  Disposition Plan: Admitted. Pending SNF on 02/21/2018  Consultants ENT Infectious disease Neurosurgery  Procedures  Right revision mastoidectomy with drainage of abscess   Antibiotics   Anti-infectives (From admission, onward)   Start     Dose/Rate Route Frequency Ordered Stop   02/17/18 1600  meropenem (MERREM) 2 g in sodium chloride 0.9 % 100 mL IVPB     2 g 200 mL/hr over 30 Minutes Intravenous Every 8 hours 02/17/18 1344     02/16/18 1100  vancomycin (VANCOCIN) 1,250 mg in sodium chloride 0.9 % 250 mL IVPB  Status:  Discontinued     1,250 mg 166.7 mL/hr over 90 Minutes Intravenous Every 12 hours 02/15/18 2304 02/17/18 1344   02/16/18 0800  ceFEPIme (MAXIPIME) 2 g in sodium chloride 0.9 % 100 mL IVPB  Status:  Discontinued     2 g 200 mL/hr over 30 Minutes Intravenous Every 8 hours 02/15/18 2304 02/17/18 1344   02/15/18 2300  ceFEPIme (MAXIPIME) 2 g in sodium chloride 0.9 % 100 mL IVPB     2 g 200 mL/hr over 30 Minutes Intravenous  Once 02/15/18 2250 02/16/18 1001   02/15/18 2300  vancomycin (VANCOCIN) 2,000 mg in sodium chloride 0.9 % 500 mL IVPB     2,000 mg 250 mL/hr over 120 Minutes Intravenous  Once 02/15/18 2250 02/16/18 1001      Subjective:   [name] [name] seen and examined today.  Was able to have a bowel movement yesterday.  Denies current chest pain, shortness breath, abdominal pain, nausea or vomiting, dizziness, headache.   Objective:   Vitals:   02/19/18 1935 02/19/18 2309 02/20/18 0410 02/20/18 0802  BP: 126/64 134/61 128/64 112/68  Pulse: 88 90 87 84  Resp: (!) 22 14 (!) 23 20  Temp: 98.3 F (36.8 C) 98.6 F (37 C) 98.2 F (36.8 C) 97.8 F (36.6 C)  TempSrc: Oral Oral Oral Axillary  SpO2: 97% 96% 98% 96%  Weight:      Height:        Intake/Output Summary (Last 24 hours) at 02/20/2018 1058 Last data filed at 02/20/2018 0701 Gross per  24 hour  Intake 682.36 ml  Output 800 ml  Net -117.64 ml   Filed Weights   02/15/18 1408  Weight: 110.2 kg   Exam  General: Well developed, chronically ill-appearing, NAD  HEENT: NCAT, right eye patch in place, right ear cup in place.  Mucous membranes moist.   Neck: Supple  Cardiovascular: S1 S2 auscultated, no murmurs, irregular  Respiratory: Clear to auscultation bilaterally with equal chest rise  Abdomen: Soft, nontender, nondistended, + bowel sounds  Extremities: warm dry without cyanosis clubbing or edema  Neuro: AAOx3, appearing, chronic right-sided facial palsy with decreased right eye movement, otherwise nonfocal  Psych: appropriate mood and affect, pleasant   Data Reviewed: I have personally reviewed following labs and imaging studies  CBC: Recent Labs  Lab 02/15/18 1449 02/16/18 0737 02/17/18 0434 02/18/18 0416 02/19/18 0454 02/20/18 0604  WBC 20.5* 17.9* 17.0* 16.4* 16.7* 14.9*  NEUTROABS 8.4*  --   --   --   --   --   HGB 12.7* 12.1* 12.3* 12.5* 11.2* 10.5*  HCT 40.1 39.0 38.8* 39.2 34.9* 34.5*  MCV 86.8 85.9 86.2 85.2 86.0 86.7  PLT 358 310 301 307 264 017   Basic Metabolic Panel: Recent Labs  Lab 02/15/18 1449 02/17/18 0434 02/18/18 0416 02/20/18 0604  NA 134* 135 135  138  K 4.3 4.2 3.8 3.5  CL 98 98 97* 101  CO2 27 24 26 30   GLUCOSE 79 168* 236* 205*  BUN 28* 21 14 13   CREATININE 0.78 0.93 0.98 0.75  CALCIUM 10.5* 9.2 9.4 9.0   GFR: Estimated Creatinine Clearance: 92.8 mL/min (by C-G formula based on SCr of 0.75 mg/dL). Liver Function Tests: No results for input(s): AST, ALT, ALKPHOS, BILITOT, PROT, ALBUMIN in the last 168 hours. No results for input(s): LIPASE, AMYLASE in the last 168 hours. No results for input(s): AMMONIA in the last 168 hours. Coagulation Profile: Recent Labs  Lab 02/16/18 2009 02/17/18 0434 02/18/18 0416 02/19/18 0454 02/20/18 0604  INR 1.80 1.50 1.57 1.99 2.13   Cardiac Enzymes: No results for  input(s): CKTOTAL, CKMB, CKMBINDEX, TROPONINI in the last 168 hours. BNP (last 3 results) No results for input(s): PROBNP in the last 8760 hours. HbA1C: No results for input(s): HGBA1C in the last 72 hours. CBG: Recent Labs  Lab 02/19/18 0628 02/19/18 1226 02/19/18 1609 02/19/18 2115 02/20/18 0633  GLUCAP 122* 132* 192* 220* 188*   Lipid Profile: No results for input(s): CHOL, HDL, LDLCALC, TRIG, CHOLHDL, LDLDIRECT in the last 72 hours. Thyroid Function Tests: No results for input(s): TSH, T4TOTAL, FREET4, T3FREE, THYROIDAB in the last 72 hours. Anemia Panel: No results for input(s): VITAMINB12, FOLATE, FERRITIN, TIBC, IRON, RETICCTPCT in the last 72 hours. Urine analysis:    Component Value Date/Time   COLORURINE YELLOW 12/14/2017 0050   APPEARANCEUR CLEAR 12/14/2017 0050   LABSPEC 1.008 12/14/2017 0050   PHURINE 6.0 12/14/2017 0050   GLUCOSEU NEGATIVE 12/14/2017 0050   HGBUR NEGATIVE 12/14/2017 0050   BILIRUBINUR NEGATIVE 12/14/2017 0050   KETONESUR NEGATIVE 12/14/2017 0050   PROTEINUR NEGATIVE 12/14/2017 0050   NITRITE NEGATIVE 12/14/2017 0050   LEUKOCYTESUR NEGATIVE 12/14/2017 0050   Sepsis Labs: @LABRCNTIP (procalcitonin:4,lacticidven:4)  ) Recent Results (from the past 240 hour(s))  MRSA PCR Screening     Status: None   Collection Time: 02/15/18 11:36 PM  Result Value Ref Range Status   MRSA by PCR NEGATIVE NEGATIVE Final    Comment:        The GeneXpert MRSA Assay (FDA approved for NASAL specimens only), is one component of a comprehensive MRSA colonization surveillance program. It is not intended to diagnose MRSA infection nor to guide or monitor treatment for MRSA infections. Performed at [facility], [place] [address], [place], [place] [address]   Aerobic/Anaerobic Culture (surgical/deep wound)     Status: None (Preliminary result)   Collection Time: 02/16/18  1:14 AM  Result Value Ref Range Status   Specimen Description WOUND RIGHT EAR  Final     Special Requests NONE  Final   Gram Stain   Final    ABUNDANT WBC PRESENT, PREDOMINANTLY PMN NO ORGANISMS SEEN Performed at [facility], [place] [address], [place], [place] [address]    Culture   Final    RARE PSEUDOMONAS AERUGINOSA NO ANAEROBES ISOLATED; CULTURE IN PROGRESS FOR 5 DAYS    Report Status PENDING  Incomplete   Organism ID, Bacteria PSEUDOMONAS AERUGINOSA  Final      Susceptibility   Pseudomonas aeruginosa - MIC*    CEFTAZIDIME 2 SENSITIVE Sensitive     CIPROFLOXACIN <=0.25 SENSITIVE Sensitive     GENTAMICIN <=1 SENSITIVE Sensitive     IMIPENEM 1 SENSITIVE Sensitive     PIP/TAZO 8 SENSITIVE Sensitive     CEFEPIME 2 SENSITIVE Sensitive     *  RARE PSEUDOMONAS AERUGINOSA  Surgical pcr screen     Status: None   Collection Time: 02/16/18  3:08 PM  Result Value Ref Range Status   MRSA, PCR NEGATIVE NEGATIVE Final   Staphylococcus aureus NEGATIVE NEGATIVE Final    Comment: (NOTE) The Xpert SA Assay (FDA approved for NASAL specimens in patients 12 years of age and older), is one component of a comprehensive surveillance program. It is not intended to diagnose infection nor to guide or monitor treatment. Performed at [facility], [place] [address] [place], [place] [address]   Fungus Culture With Stain     Status: None (Preliminary result)   Collection Time: 02/16/18  9:40 PM  Result Value Ref Range Status   Fungus Stain Final report  Final    Comment: (NOTE) Performed At: [facility] [place], [place] [id number] Rush Farmer MD WL:[id number]    Fungus (Mycology) Culture PENDING  Incomplete   Fungal Source WOUND  Final    Comment: RT MASTOI D DRAINAGE  Aerobic/Anaerobic Culture (surgical/deep wound)     Status: None (Preliminary result)   Collection Time: 02/16/18  9:40 PM  Result Value Ref Range Status   Specimen Description WOUND  Final   Special Requests RT MASTOID DRAINAGE  Final   Gram Stain   Final    ABUNDANT WBC  PRESENT,BOTH PMN AND MONONUCLEAR NO ORGANISMS SEEN Performed at [facility], [address], [place], [place] [address]    Culture FEW PSEUDOMONAS AERUGINOSA  Final   Report Status PENDING  Incomplete   Organism ID, Bacteria PSEUDOMONAS AERUGINOSA  Final      Susceptibility   Pseudomonas aeruginosa - MIC*    CEFTAZIDIME 2 SENSITIVE Sensitive     CIPROFLOXACIN <=0.25 SENSITIVE Sensitive     GENTAMICIN <=1 SENSITIVE Sensitive     IMIPENEM 2 SENSITIVE Sensitive     PIP/TAZO 8 SENSITIVE Sensitive     CEFEPIME 4 SENSITIVE Sensitive     * FEW PSEUDOMONAS AERUGINOSA  Fungus Culture Result     Status: None   Collection Time: 02/16/18  9:40 PM  Result Value Ref Range Status   Result 1 Comment  Final    Comment: (NOTE) KOH/Calcofluor preparation:  no fungus observed. Performed At: [facility] [place], [place] [id number] Rush Farmer MD SH:[id number]       Radiology Studies: Dg Chest Port 1 View  Result Date: 02/19/2018 CLINICAL DATA:  Status post PICC line placement EXAM: PORTABLE CHEST 1 VIEW COMPARISON:  12/16/2017 FINDINGS: Cardiac shadow is stable. Aortic calcifications are stable. New right PICC line is noted in the mid superior vena cava in satisfactory position. No focal infiltrate or sizable effusion is noted. No acute bony abnormality is noted. IMPRESSION: PICC line in satisfactory position. Electronically Signed   By: Inez Catalina M.D.   On: 02/19/2018 11:15   [place] Ekg Site Rite  Result Date: 02/18/2018 If Site Rite image not attached, placement could not be confirmed due to current cardiac rhythm.    Scheduled Meds: . atorvastatin  10 mg Oral QPM  . calcium-vitamin D  2 tablet Oral Q breakfast  . cholecalciferol  1,000 Units Oral q morning - 10a  . clotrimazole  1 application Topical q morning - 10a  . digoxin  0.125 mg Oral Q breakfast  . feeding supplement (ENSURE ENLIVE)  237 mL Oral BID BM  . finasteride  5 mg Oral q morning - 10a    . insulin  aspart  0-9 Units Subcutaneous TID WC  . insulin detemir  20 Units Subcutaneous Daily  . LORazepam  1 mg Oral QHS  . multivitamin with minerals  1 tablet Oral Q breakfast  . omega-3 acid ethyl esters  1 capsule Oral BID  . sodium chloride flush  10-40 mL Intracatheter Q12H  . vitamin C  1,000 mg Oral Q breakfast  . vitamin E  400 Units Oral q morning - 10a  . warfarin  5 mg Oral ONCE-1800  . Warfarin - Pharmacist Dosing Inpatient   Does not apply q1800   Continuous Infusions: . lactated ringers Stopped (02/19/18 1451)  . meropenem (MERREM) IV Stopped (02/20/18 2500)     LOS: 5 days   Time Spent in minutes   30 minutes  Betsy Rosello D.O. on 02/20/2018 at 10:58 AM  Between 7am to 7pm - Please see pager noted on [email or web address]  After 7pm go to [email or web address]  And look for the night coverage person covering for me after hours  [facility] Office  [phone]

## 2018-02-21 ENCOUNTER — Inpatient Hospital Stay
Admission: RE | Admit: 2018-02-21 | Discharge: 2018-03-31 | Disposition: A | Discharge status: Home / Self Care | Payer: Medicare Other | Source: Ambulatory Visit | Attending: Internal Medicine | Admitting: Internal Medicine

## 2018-02-21 DIAGNOSIS — F29 Unspecified psychosis not due to a substance or known physiological condition: Secondary | ICD-10-CM | POA: Diagnosis not present

## 2018-02-21 DIAGNOSIS — R0902 Hypoxemia: Secondary | ICD-10-CM | POA: Diagnosis not present

## 2018-02-21 DIAGNOSIS — M2550 Pain in unspecified joint: Secondary | ICD-10-CM | POA: Diagnosis not present

## 2018-02-21 DIAGNOSIS — G8929 Other chronic pain: Secondary | ICD-10-CM | POA: Diagnosis not present

## 2018-02-21 DIAGNOSIS — M869 Osteomyelitis, unspecified: Secondary | ICD-10-CM | POA: Diagnosis not present

## 2018-02-21 DIAGNOSIS — Z7401 Bed confinement status: Secondary | ICD-10-CM | POA: Diagnosis not present

## 2018-02-21 DIAGNOSIS — Z96651 Presence of right artificial knee joint: Secondary | ICD-10-CM | POA: Diagnosis not present

## 2018-02-21 DIAGNOSIS — N400 Enlarged prostate without lower urinary tract symptoms: Secondary | ICD-10-CM | POA: Diagnosis not present

## 2018-02-21 DIAGNOSIS — E1169 Type 2 diabetes mellitus with other specified complication: Secondary | ICD-10-CM | POA: Diagnosis not present

## 2018-02-21 DIAGNOSIS — R29818 Other symptoms and signs involving the nervous system: Secondary | ICD-10-CM | POA: Diagnosis not present

## 2018-02-21 DIAGNOSIS — M8618 Other acute osteomyelitis, other site: Secondary | ICD-10-CM | POA: Diagnosis not present

## 2018-02-21 DIAGNOSIS — R21 Rash and other nonspecific skin eruption: Secondary | ICD-10-CM | POA: Diagnosis not present

## 2018-02-21 DIAGNOSIS — R262 Difficulty in walking, not elsewhere classified: Secondary | ICD-10-CM | POA: Diagnosis not present

## 2018-02-21 DIAGNOSIS — I482 Chronic atrial fibrillation, unspecified: Secondary | ICD-10-CM | POA: Diagnosis not present

## 2018-02-21 DIAGNOSIS — M8660 Other chronic osteomyelitis, unspecified site: Secondary | ICD-10-CM

## 2018-02-21 DIAGNOSIS — H7091 Unspecified mastoiditis, right ear: Secondary | ICD-10-CM | POA: Diagnosis not present

## 2018-02-21 DIAGNOSIS — M6281 Muscle weakness (generalized): Secondary | ICD-10-CM | POA: Diagnosis not present

## 2018-02-21 DIAGNOSIS — Z7901 Long term (current) use of anticoagulants: Secondary | ICD-10-CM | POA: Diagnosis not present

## 2018-02-21 DIAGNOSIS — G08 Intracranial and intraspinal phlebitis and thrombophlebitis: Secondary | ICD-10-CM | POA: Diagnosis not present

## 2018-02-21 DIAGNOSIS — F419 Anxiety disorder, unspecified: Secondary | ICD-10-CM | POA: Diagnosis not present

## 2018-02-21 DIAGNOSIS — R279 Unspecified lack of coordination: Secondary | ICD-10-CM | POA: Diagnosis not present

## 2018-02-21 DIAGNOSIS — R5381 Other malaise: Secondary | ICD-10-CM | POA: Diagnosis not present

## 2018-02-21 DIAGNOSIS — G510 Bell's palsy: Secondary | ICD-10-CM | POA: Diagnosis not present

## 2018-02-21 DIAGNOSIS — Z794 Long term (current) use of insulin: Secondary | ICD-10-CM | POA: Diagnosis not present

## 2018-02-21 DIAGNOSIS — E1165 Type 2 diabetes mellitus with hyperglycemia: Secondary | ICD-10-CM | POA: Diagnosis not present

## 2018-02-21 DIAGNOSIS — F411 Generalized anxiety disorder: Secondary | ICD-10-CM | POA: Diagnosis not present

## 2018-02-21 DIAGNOSIS — I69822 Dysarthria following other cerebrovascular disease: Secondary | ICD-10-CM | POA: Diagnosis not present

## 2018-02-21 DIAGNOSIS — Z978 Presence of other specified devices: Secondary | ICD-10-CM | POA: Diagnosis not present

## 2018-02-21 DIAGNOSIS — R2689 Other abnormalities of gait and mobility: Secondary | ICD-10-CM | POA: Diagnosis not present

## 2018-02-21 DIAGNOSIS — C9110 Chronic lymphocytic leukemia of B-cell type not having achieved remission: Secondary | ICD-10-CM | POA: Diagnosis not present

## 2018-02-21 DIAGNOSIS — I4819 Other persistent atrial fibrillation: Secondary | ICD-10-CM | POA: Diagnosis not present

## 2018-02-21 DIAGNOSIS — G062 Extradural and subdural abscess, unspecified: Secondary | ICD-10-CM | POA: Diagnosis not present

## 2018-02-21 DIAGNOSIS — Z89422 Acquired absence of other left toe(s): Secondary | ICD-10-CM | POA: Diagnosis not present

## 2018-02-21 DIAGNOSIS — I5032 Chronic diastolic (congestive) heart failure: Secondary | ICD-10-CM | POA: Diagnosis not present

## 2018-02-21 DIAGNOSIS — I110 Hypertensive heart disease with heart failure: Secondary | ICD-10-CM | POA: Diagnosis not present

## 2018-02-21 DIAGNOSIS — E785 Hyperlipidemia, unspecified: Secondary | ICD-10-CM | POA: Diagnosis not present

## 2018-02-21 DIAGNOSIS — I2510 Atherosclerotic heart disease of native coronary artery without angina pectoris: Secondary | ICD-10-CM | POA: Diagnosis not present

## 2018-02-21 DIAGNOSIS — N289 Disorder of kidney and ureter, unspecified: Secondary | ICD-10-CM | POA: Diagnosis not present

## 2018-02-21 DIAGNOSIS — I10 Essential (primary) hypertension: Secondary | ICD-10-CM | POA: Diagnosis not present

## 2018-02-21 DIAGNOSIS — Z48810 Encounter for surgical aftercare following surgery on the sense organs: Secondary | ICD-10-CM | POA: Diagnosis not present

## 2018-02-21 LAB — AEROBIC/ANAEROBIC CULTURE W GRAM STAIN (SURGICAL/DEEP WOUND)

## 2018-02-21 LAB — GLUCOSE, CAPILLARY
Glucose-Capillary: 177 mg/dL — ABNORMAL HIGH (ref 70–99)
Glucose-Capillary: 249 mg/dL — ABNORMAL HIGH (ref 70–99)

## 2018-02-21 LAB — AEROBIC/ANAEROBIC CULTURE (SURGICAL/DEEP WOUND)

## 2018-02-21 LAB — CBC
HCT: 33.9 % — ABNORMAL LOW (ref 39.0–52.0)
HEMOGLOBIN: 10.5 g/dL — AB (ref 13.0–17.0)
MCH: 26.8 pg (ref 26.0–34.0)
MCHC: 31 g/dL (ref 30.0–36.0)
MCV: 86.5 fL (ref 80.0–100.0)
NRBC: 0 % (ref 0.0–0.2)
PLATELETS: 257 10*3/uL (ref 150–400)
RBC: 3.92 MIL/uL — AB (ref 4.22–5.81)
RDW: 14.7 % (ref 11.5–15.5)
WBC: 16.6 10*3/uL — ABNORMAL HIGH (ref 4.0–10.5)

## 2018-02-21 LAB — PROTIME-INR
INR: 2.18
PROTHROMBIN TIME: 23.9 s — AB (ref 11.4–15.2)

## 2018-02-21 MED ORDER — TRAMADOL HCL 50 MG PO TABS: 50.0000 mg | ORAL_TABLET | Freq: Four times a day (QID) | ORAL | 0 refills | Status: DC | PRN

## 2018-02-21 MED ORDER — BISACODYL 5 MG PO TBEC: 5.0000 mg | DELAYED_RELEASE_TABLET | Freq: Every day | ORAL | 0 refills | Status: DC | PRN

## 2018-02-21 MED ORDER — INSULIN ASPART 100 UNIT/ML FLEXPEN: 0.0000 [IU] | PEN_INJECTOR | Freq: Three times a day (TID) | SUBCUTANEOUS | Status: DC

## 2018-02-21 MED ORDER — LORAZEPAM 1 MG PO TABS: 1.0000 mg | ORAL_TABLET | Freq: Every evening | ORAL | 0 refills | Status: DC | PRN

## 2018-02-21 MED ORDER — ENSURE ENLIVE PO LIQD: 237.0000 mL | Freq: Two times a day (BID) | ORAL | Status: DC

## 2018-02-21 MED ORDER — INSULIN DETEMIR 100 UNIT/ML FLEXPEN: 20.0000 [IU] | PEN_INJECTOR | Freq: Every day | SUBCUTANEOUS | Status: DC

## 2018-02-21 MED ORDER — SODIUM CHLORIDE 0.9 % IV SOLN: 2.0000 g | Freq: Three times a day (TID) | INTRAVENOUS | Status: DC

## 2018-02-21 NOTE — Progress Notes (Signed)
Pt. Being discharged to SNF via stretcher and PTAR.  Report given to nurse at [facility].

## 2018-02-21 NOTE — Discharge Instructions (Signed)
Bone and Joint Infections, Adult Bone infections (osteomyelitis) and joint infections (septic arthritis) occur when bacteria or other germs get inside a bone or a joint. This can happen if you have an infection in another part of your body that spreads through your blood. Germs from your skin or from outside of your body can also cause this type of infection if you have a wound or a broken bone (fracture) that breaks the skin. Anyone can get a bone infection or joint infection. You may be more likely to get this type of infection if you have a condition, such as diabetes, that lowers your ability to fight infection or increases your chances of getting an infection. Bone and joint infections can cause damage, and they can spread to other areas of your body. They need to be treated quickly. What are the causes? Most bone and joint infections are caused by bacteria. They can also be caused by other germs, such as viruses and funguses. What increases the risk? This condition is more likely to develop in:  People who recently had surgery, especially bone or joint surgery.  People who have a long-term (chronic) disease, such as: ? HIV (human immunodeficiency virus). ? Diabetes. ? Rheumatoid arthritis. ? Sickle cell anemia.  Elderly people.  People who take medicines that block or weaken the body's defense system (immune system).  People who have a condition that reduces their blood flow.  People who are on kidney dialysis.  People who have an artificial joint.  People who have had a joint or bone repaired with plates or screws (surgical hardware).  People who use or abuse IV drugs.  People who have had trauma, such as stepping on a nail.  What are the signs or symptoms? Symptoms vary depending on the type and location of your infection. Common symptoms of bone and joint infections include:  Fever and chills.  Redness and warmth.  Swelling.  Pain and stiffness.  Drainage of fluid  or pus near the infection.  Weight loss and fatigue.  Decreased ability to use a hand or foot.  How is this diagnosed? This condition may be diagnosed based on symptoms, medical history, a physical exam, and diagnostic tests. Tests can help to identify the cause of the infection. You may have various tests, such as:  A sample of tissue, fluid, or blood taken to be examined under a microscope.  A procedure to remove fluid from the infected joint with a needle (joint aspiration) for testing in a lab.  Pus or discharge swabbed from a wound for testing to identify germs and to determine what type of medicine will kill them (culture and sensitivity).  Blood tests to look for evidence of infection and inflammation (biomarkers).  Imaging studies to determine how severe the bone or joint infection is. These may include: ? X-rays. ? CT scan. ? MRI. ? Bone scan.  How is this treated? Treatment depends on the cause and type of infection. Antibiotic medicines are usually the first treatment for a bone or joint infection. Treatment with antibiotics may include:  Getting IV antibiotics. This may be done in a hospital at first. You may have to continue IV antibiotics at home for several weeks. You may also have to take antibiotics by mouth for several weeks after that.  Taking more than one kind of antibiotic. Treatment may start with a type of antibiotic that works against many different bacteria (broad spectrumantibiotics). IV antibiotics may be changed if tests show that another   type may work better.  Other treatments may include:  Draining fluid from the joint by placing a needle into it (aspiration).  Surgery to remove: ? Dead or dying tissue from a bone or joint. ? An infected artificial joint. ? Infected plates or screws that were used to repair a broken bone.  Follow these instructions at home:  Take medicines only as directed by your health care provider.  Take your antibiotic  medicine as directed by your health care provider. Finish the antibiotic even if you start to feel better.  Follow instructions from your health care provider about how to take IV antibiotics at home.  Ask your health care provider if you have any restrictions on your activities.  Keep all follow-up visits as directed by your health care provider. This is important. Contact a health care provider if:  You have a fever or chills.  You have redness, warmth, pain, or swelling that returns after treatment. Get help right away if:  You have rapid breathing or you have trouble breathing.  You have chest pain.  You cannot drink fluids or make urine.  The affected arm or leg swells, changes color, or turns blue. This information is not intended to replace advice given to you by your health care provider. Make sure you discuss any questions you have with your health care provider. Document Released: 03/23/2005 Document Revised: 08/29/2015 Document Reviewed: 03/21/2014 Elsevier Interactive Patient Education  2018 [employer]  

## 2018-02-21 NOTE — Clinical Social Work Placement (Signed)
Nurse to call report to [phone], Room [place]  NOTE  Date:  02/21/2018  Patient Details  Name: [name] [name] MRN: [id number] Date of Birth: Apr 08, 1934  Clinical Social Work is seeking post-discharge placement for this patient at the [place] level of care (*CSW will initial, date and re-position this form in  chart as items are completed):  Yes   Patient/family provided with [facility]'s list of facilities offering this level of care within the geographic area requested by the patient (or if unable, by the patient's family).  Yes   Patient/family informed of their freedom to choose among providers that offer the needed level of care, that participate in Medicare, Medicaid or managed care program needed by the patient, have an available bed and are willing to accept the patient.  Yes   Patient/family informed of [facility]'s ownership interest in [facility] and [facility], as well as of the fact that they are under no obligation to receive care at these facilities.  PASRR submitted to EDS on       PASRR number received on       Existing PASRR number confirmed on 02/18/18     FL2 transmitted to all facilities in geographic area requested by pt/family on 02/18/18     FL2 transmitted to all facilities within larger geographic area on       Patient informed that his/her managed care company has contracts with or will negotiate with certain facilities, including the following:        Yes   Patient/family informed of bed offers received.  Patient chooses bed at [facility]     Physician recommends and patient chooses bed at      Patient to be transferred to [facility] on 02/21/18.  Patient to be transferred to facility by [facility]     Patient family notified on 02/21/18 of transfer.  Name of family member notified:  Wife     PHYSICIAN       Additional Comment:     _______________________________________________ Geralynn Ochs, LCSW 02/21/2018, 11:18 AM

## 2018-02-21 NOTE — Discharge Summary (Signed)
Physician Discharge Summary  [name] [name] KVQ:[id number] DOB: 07-06-1934 DOA: 02/15/2018  PCP: [name], MD  Admit date: 02/15/2018 Discharge date: 02/21/2018  Time spent: 45 minutes  Recommendations for Outpatient Follow-up:  Patient will be discharged to skilled nursing facility, continue physical and occupational therapy.  Patient will need to follow up with primary care provider within one week of discharge.  Follow up with ENT, Dr. Benjamine Mola. Patient should continue medications as prescribed.  Patient should follow a heart healthy/carb modified diet.   Discharge Diagnoses:  Skull base osteomyelitis, epidural abscess, sigmoid sinus thrombus Chronic atrial fibrillation Right facial palsy Physical Deconditioning Diabetes mellitus, type II Chronic diastolic congestive heart failure Essential hypertension Coronary artery disease Hyperlipidemia Anxiety Chronic pain Constipation Hypoxia   Discharge Condition: Stable  Diet recommendation: heart healthy/carb modified  Filed Weights   02/15/18 1408  Weight: 110.2 kg    History of present illness:  On 02/15/2018 by Dr. Johnette [name] Woodyis a 83 y.o.malewith medical history significant ofchronicA. fib, CLL, coronary artery disease, hypertension, hyperlipidemia, IgA nephropathy, type 2 diabetes, history of recurrent mastoiditis, prior partial excision, ongoing topical antibiotic therapy,history of right facial palsy presenting to the hospital for evaluation of abnormal MRI.Patient has poor hearing and ongoing illness in his right ear, history was provided by wife at bedside. Wife states that the patient has had 2 right ear surgeries since June and has had Bell's palsy since July. For the past 1 week, he has been having double vision in his right eye, as such, he was taken to a neuro-ophthalmologist in [facility] yesterday. Patient had an MRI performed this morning and after being informed of abnormal results is  here for evaluation. Hehas not been having any fevers or chills. His appetite has been reduced for the past few months due to Bell's palsy. Patient denied having any pain.  Patient was admitted from December 13, 2017 to December 18, 2017 for acute mastoiditis/right otitis media. PICC line was placed and he was treated with IV antibiotics (ceftriaxone, vancomycin, metronidazole) through January 23, 2018.  Hospital Course:  Skull base osteomyelitis, epidural abscess, sigmoid sinus thrombus -Presented with leukocytosis otherwise hemodynamically stable -MRI showing increased fluid throughout the right mastoid cells now including the petrous apex with extensive surrounding enhancement consistent with petrous apicitis and skull base osteomyelitis.  No regional dural thickening most notable along the clivus and suspected small retroclival epidural abscess.  New thrombus in the right transverse and right sigmoid sinuses. -ENT, Dr. Benjamine Mola, consulted and appreciated, status post right revision mastoidectomy with drainage of abscess -Neurosurgery also consulted however no role for neurosurgery at this time, Dr. Christella Noa has spoken with Dr. Benjamine Mola -Infectious disease consulted and appreciated and has recommended Merrem -Surgical wound culture: Rare pseudomonas aeruginosa, pansensitive; GNR -Patient noted to have some bleeding around surgical site/ear.  Discussed with Dr. Benjamine Mola, recommended using your cup with gauze.  Okay to continue anticoagulation. -PICC line placed  Chronic atrial fibrillation -CHADSVASC 6  -INR was supratherapeutic on admission, patient was given vitamin K for reversal for surgery -INR currently 2.18 -heparin discontinued on 11/16/219, continue coumadin   Right facial palsy -Has had decreased intake since right facial palsy.  Currently undergoing speech therapy as an outpatient.  Physical Deconditioning -PT evaluated patient and recommended SNF -Social work consulted    Diabetes mellitus, type II -Hemoglobin A1c 9.5 in September 2019 -Continue insulin sliding scale and home medications -follow up with PCP to discuss better control  Chronic diastolic congestive heart failure -Currently  appears to be euvolemic and compensated -Hold home Lasix- resume on discharge -Monitor intake and output, daily weights  Essential hypertension -Continue to monitor, currently stable -Resume home meds on discharge  Coronary artery disease -Currently chest pain-free  Hyperlipidemia -Continue statin  Anxiety -Continue Ativan  Chronic pain -Continue tramadol as needed  Constipation -resolved with bowel regimen  Hypoxia -occurs at night when patient is sleeping- oxygen saturations drop -patient is a mouth breather -discussed with wife at bedside and recommended overnight pulse ox or sleep study as an outpatient   Consultants ENT Infectious disease Neurosurgery  Procedures  Right revision mastoidectomy with drainage of abscess PICC line placement  Discharge Exam: Vitals:   02/21/18 0451 02/21/18 0814  BP: 139/68 135/86  Pulse:    Resp:    Temp: 98.7 F (37.1 C) 97.9 F (36.6 C)  SpO2:     Denies current chest pain, shortness breath, abdominal pain, nausea vomiting, diarrhea or constipation, dizziness or headache.   General: Well developed, chronically ill-appearing, NAD  HEENT: NCAT, right eye patch and right ear cup in place mucous membranes moist.  Cardiovascular: S1 S2 auscultated, no rubs, murmurs or gallops. Regular rate and rhythm.  Respiratory: Clear to auscultation bilaterally with equal chest rise  Abdomen: Soft, nontender, nondistended, + bowel sounds  Extremities: warm dry without cyanosis clubbing or edema  Neuro: AAOx3, right-sided facial palsy, otherwise nonfocal  Psych: Pleasant, appropriate mood and affect  Discharge Instructions Discharge Instructions    Discharge instructions   Complete by:  As  directed    Patient will be discharged to skilled nursing facility, continue physical and occupational therapy.  Patient will need to follow up with primary care provider within one week of discharge.  Follow up with ENT, Dr. Benjamine Mola. Patient should continue medications as prescribed.  Patient should follow a heart healthy/carb modified diet.     Allergies as of 02/21/2018      Reactions   Erythromycin Other (See Comments)   Penicillins Hives, Other (See Comments)   Has patient had a PCN reaction causing immediate rash, facial/tongue/throat swelling, SOB or lightheadedness with hypotension: No Has patient had a PCN reaction causing severe rash involving mucus membranes or skin necrosis: No Has patient had a PCN reaction that required hospitalization No Has patient had a PCN reaction occurring within the last 10 years: No If all of the above answers are "NO", then may proceed with Cephalosporin use. Has tolerated Rocephin      Medication List    TAKE these medications   atorvastatin 10 MG tablet Commonly known as:  LIPITOR Take 10 mg by mouth every evening.   bisacodyl 5 MG EC tablet Commonly known as:  DULCOLAX Take 1 tablet (5 mg total) by mouth daily as needed for moderate constipation.   CALCIUM 600+D 600-800 MG-UNIT Tabs Generic drug:  Calcium Carb-Cholecalciferol Take 2 tablets by mouth daily with breakfast.   cholecalciferol 1000 units tablet Commonly known as:  VITAMIN D Take 1,000 Units by mouth every morning.   ciclopirox 0.77 % cream Commonly known as:  LOPROX Apply 1 application topically every morning. APPLY TO THE FEET   Co Q-10 100 MG Caps Take 100 mg by mouth daily with breakfast.   digoxin 0.125 MG tablet Commonly known as:  LANOXIN Take 0.125 mg by mouth daily with breakfast.   feeding supplement (ENSURE ENLIVE) Liqd Take 237 mLs by mouth 2 (two) times daily between meals.   finasteride 5 MG tablet Commonly known as:  PROSCAR Take  5 mg by mouth every  morning.   Fish Oil 1200 MG Caps Take 2 capsules by mouth 2 (two) times daily.   furosemide 20 MG tablet Commonly known as:  LASIX Take 20-40 mg by mouth See admin instructions. Take 20 mg by mouth on Sun, Tues, Thurs, Sat, and 40 mg on Mon, Wed, Fri.   insulin aspart 100 UNIT/ML FlexPen Commonly known as:  NOVOLOG Inject 0-4 Units into the skin 3 (three) times daily with meals. CBG 121 - 150: 1 unit,  CBG 151 - 200: 2 units,  CBG 201 - 250: 3 units,  CBG 251 - 300: 5 units,  CBG 301 - 350: 7 units,  CBG 351 - 400: 9 units What changed:    when to take this  additional instructions   Insulin Detemir 100 UNIT/ML Pen Commonly known as:  LEVEMIR Inject 20 Units into the skin daily. What changed:  how much to take   lisinopril 20 MG tablet Commonly known as:  PRINIVIL,ZESTRIL Take 40 mg by mouth daily with supper.   LORazepam 1 MG tablet Commonly known as:  ATIVAN Take 1 tablet (1 mg total) by mouth at bedtime as needed for sleep.   meropenem 2 g in sodium chloride 0.9 % 100 mL Inject 2 g into the vein every 8 (eight) hours.   multivitamin with minerals Tabs tablet Take 1 tablet by mouth daily with breakfast.   pioglitazone 15 MG tablet Commonly known as:  ACTOS Take 7.5 mg by mouth every evening.   potassium chloride SA 20 MEQ tablet Commonly known as:  K-DUR,KLOR-CON Take 40 mEq by mouth daily. Take starting 01/07/2018   traMADol 50 MG tablet Commonly known as:  ULTRAM Take 1 tablet (50 mg total) by mouth every 6 (six) hours as needed for moderate pain.   VICTOZA 18 MG/3ML Sopn Generic drug:  liraglutide Inject 1.8 mg into the skin daily with breakfast.   vitamin C 1000 MG tablet Take 1,000 mg by mouth daily with breakfast.   vitamin E 400 UNIT capsule Take 400 Units by mouth every morning.   warfarin 5 MG tablet Commonly known as:  COUMADIN Take as directed. If you are unsure how to take this medication, talk to your nurse or doctor. Original instructions:   Take 1 tablet daily or as directed What changed:    how much to take  how to take this  when to take this  additional instructions      Allergies  Allergen Reactions  . Erythromycin Other (See Comments)  . Penicillins Hives and Other (See Comments)    Has patient had a PCN reaction causing immediate rash, facial/tongue/throat swelling, SOB or lightheadedness with hypotension: No Has patient had a PCN reaction causing severe rash involving mucus membranes or skin necrosis: No Has patient had a PCN reaction that required hospitalization No Has patient had a PCN reaction occurring within the last 10 years: No If all of the above answers are "NO", then may proceed with Cephalosporin use.  Has tolerated Rocephin    Contact information for follow-up providers    [name], MD. Schedule an appointment as soon as possible for a visit in 1 week(s).   Specialty:  Internal Medicine Why:  Hospital follow up, discuss overnight sleep study Contact information: [place] [address] [phone]        Leta Baptist, MD. Schedule an appointment as soon as possible for a visit in 1 week(s).   Specialty:  Otolaryngology Why:  Hospital follow up Contact information: [address] [place] [address] [phone]            Contact information for after-discharge care    [facility] .   Service:  Skilled Nursing Contact information: [address] [place] [phone]                   The results of significant diagnostics from this hospitalization (including imaging, microbiology, ancillary and laboratory) are listed below for reference.    Significant Diagnostic Studies: Dg Eye Foreign Body  Result Date: 02/15/2018 CLINICAL DATA:  Metal working/exposure; clearance prior to MRI EXAM: ORBITS FOR FOREIGN BODY - 2 VIEW COMPARISON:  10/26/2017 FINDINGS: There is no evidence  of metallic foreign body within the orbits. No significant bone abnormality identified. Dental hardware evident. Sinuses are clear. Cervical spondylosis noted. IMPRESSION: No evidence of metallic foreign body within the orbits. Electronically Signed   By: Jerilynn Mages.  Shick M.D.   On: 02/15/2018 08:22   [name] Head Wo Contrast  Result Date: 02/15/2018 CLINICAL DATA:  Right 6th nerve palsy. Diplopia with right eye vision loss. Right-sided facial droop. History of right ear malignant otitis externa, chronic mastoiditis with abscess, and facial nerve paralysis status post canal wall down tympanomastoidectomy. EXAM: MRI HEAD WITHOUT AND WITH CONTRAST MRA HEAD WITHOUT CONTRAST TECHNIQUE: Multiplanar, multiecho pulse sequences of the brain and surrounding structures were obtained without and with intravenous contrast. Angiographic images of the head were obtained using MRA technique without contrast. CONTRAST:  10 mL Gadavist COMPARISON:  10/26/2017 brain MRI FINDINGS: MRI HEAD FINDINGS Brain: There is progressive, large volume fluid throughout the right mastoid air cells. There is new fluid in the right petrous apex with extensive surrounding enhancement which extends into the soft tissues inferior to the skull base. Enhancement also extends into the right suboccipital scalp soft tissues. There is new abnormal dural thickening and enhancement throughout the posterior fossa on the right including along the clivus measuring up to 5 mm in thickness. Within the dural thickening along the clivus in a right paramedian location is a nonenhancing focus measuring 7 x 2 x 13 mm (series 18, image 32 and series 20, image 9). Abnormal enhancement extends into Meckel's cave on the right as well as into the posterior aspect of the right cavernous sinus with dural thickening along the medial aspect of the right middle cranial fossa. No acute infarct or intracranial hemorrhage is identified. There is moderate cerebral atrophy. No significant  cerebral white matter disease is seen for age. Vascular: Major intracranial arterial flow voids are preserved. There is new loss of the right transverse and sigmoid sinus flow void with suggestion of nonocclusive thrombus on postcontrast images. Skull and upper cervical spine: New abnormal marrow precontrast T1 hypointensity and enhancement within the right aspect of the clivus and petrous apex consistent with osteomyelitis. Sinuses/Orbits: Mild mucosal thickening in the right ethmoid, maxillary, and sphenoid sinuses. Clear left mastoid air cells. Bilateral cataract extraction. Other: None. MRA HEAD FINDINGS The visualized distal vertebral arteries are patent to the basilar and codominant. Patent PICA and SCA origins are identified bilaterally. The basilar artery is widely patent. Posterior communicating arteries are not identified and may be diminutive or absent. PCAs are patent with symmetric branch vessel attenuation but no significant proximal stenosis. The internal carotid arteries are patent without evidence of flow limiting stenosis. The right ICA appears slightly  narrowed just below the skull base as it courses through the above described inflammatory process. ACAs and MCAs are patent without evidence of significant M1 stenosis. Mildly diminished signal in the proximal M2 segments bilaterally is artifactual, however there may be superimposed mild-to-moderate left greater than right atherosclerotic irregularity and narrowing. Right A1 segment irregularity and proximal right A2 signal loss are favored to be artifactual. No aneurysm is identified. IMPRESSION: 1. Increased fluid throughout the right mastoid air cells now including the petrous apex with extensive surrounding enhancement consistent with petrous apicitis and skull base osteomyelitis. 2. New regional dural thickening most notable along the clivus with suspected small retroclival epidural abscess. 3. New thrombus in the right transverse and right  sigmoid sinuses. 4. No arterial large vessel occlusion or definite flow limiting proximal stenosis. These results were called by telephone at the time of interpretation on 02/15/2018 at 12:10 pm to Dr. Michel Bickers , who verbally acknowledged these results. Electronically Signed   By: Logan Bores M.D.   On: 02/15/2018 12:14   Mr Jeri Cos WJ Contrast  Result Date: 02/15/2018 CLINICAL DATA:  Right 6th nerve palsy. Diplopia with right eye vision loss. Right-sided facial droop. History of right ear malignant otitis externa, chronic mastoiditis with abscess, and facial nerve paralysis status post canal wall down tympanomastoidectomy. EXAM: MRI HEAD WITHOUT AND WITH CONTRAST MRA HEAD WITHOUT CONTRAST TECHNIQUE: Multiplanar, multiecho pulse sequences of the brain and surrounding structures were obtained without and with intravenous contrast. Angiographic images of the head were obtained using MRA technique without contrast. CONTRAST:  10 mL Gadavist COMPARISON:  10/26/2017 brain MRI FINDINGS: MRI HEAD FINDINGS Brain: There is progressive, large volume fluid throughout the right mastoid air cells. There is new fluid in the right petrous apex with extensive surrounding enhancement which extends into the soft tissues inferior to the skull base. Enhancement also extends into the right suboccipital scalp soft tissues. There is new abnormal dural thickening and enhancement throughout the posterior fossa on the right including along the clivus measuring up to 5 mm in thickness. Within the dural thickening along the clivus in a right paramedian location is a nonenhancing focus measuring 7 x 2 x 13 mm (series 18, image 32 and series 20, image 9). Abnormal enhancement extends into Meckel's cave on the right as well as into the posterior aspect of the right cavernous sinus with dural thickening along the medial aspect of the right middle cranial fossa. No acute infarct or intracranial hemorrhage is identified. There is moderate  cerebral atrophy. No significant cerebral white matter disease is seen for age. Vascular: Major intracranial arterial flow voids are preserved. There is new loss of the right transverse and sigmoid sinus flow void with suggestion of nonocclusive thrombus on postcontrast images. Skull and upper cervical spine: New abnormal marrow precontrast T1 hypointensity and enhancement within the right aspect of the clivus and petrous apex consistent with osteomyelitis. Sinuses/Orbits: Mild mucosal thickening in the right ethmoid, maxillary, and sphenoid sinuses. Clear left mastoid air cells. Bilateral cataract extraction. Other: None. MRA HEAD FINDINGS The visualized distal vertebral arteries are patent to the basilar and codominant. Patent PICA and SCA origins are identified bilaterally. The basilar artery is widely patent. Posterior communicating arteries are not identified and may be diminutive or absent. PCAs are patent with symmetric branch vessel attenuation but no significant proximal stenosis. The internal carotid arteries are patent without evidence of flow limiting stenosis. The right ICA appears slightly narrowed just below the skull base as it courses  through the above described inflammatory process. ACAs and MCAs are patent without evidence of significant M1 stenosis. Mildly diminished signal in the proximal M2 segments bilaterally is artifactual, however there may be superimposed mild-to-moderate left greater than right atherosclerotic irregularity and narrowing. Right A1 segment irregularity and proximal right A2 signal loss are favored to be artifactual. No aneurysm is identified. IMPRESSION: 1. Increased fluid throughout the right mastoid air cells now including the petrous apex with extensive surrounding enhancement consistent with petrous apicitis and skull base osteomyelitis. 2. New regional dural thickening most notable along the clivus with suspected small retroclival epidural abscess. 3. New thrombus in  the right transverse and right sigmoid sinuses. 4. No arterial large vessel occlusion or definite flow limiting proximal stenosis. These results were called by telephone at the time of interpretation on 02/15/2018 at 12:10 pm to Dr. Michel Bickers , who verbally acknowledged these results. Electronically Signed   By: Logan Bores M.D.   On: 02/15/2018 12:14   Dg Chest Port 1 View  Result Date: 02/19/2018 CLINICAL DATA:  Status post PICC line placement EXAM: PORTABLE CHEST 1 VIEW COMPARISON:  12/16/2017 FINDINGS: Cardiac shadow is stable. Aortic calcifications are stable. New right PICC line is noted in the mid superior vena cava in satisfactory position. No focal infiltrate or sizable effusion is noted. No acute bony abnormality is noted. IMPRESSION: PICC line in satisfactory position. Electronically Signed   By: Inez Catalina M.D.   On: 02/19/2018 11:15   [place] Ekg Site Rite  Result Date: 02/18/2018 If Site Rite image not attached, placement could not be confirmed due to current cardiac rhythm.   Microbiology: Recent Results (from the past 240 hour(s))  MRSA PCR Screening     Status: None   Collection Time: 02/15/18 11:36 PM  Result Value Ref Range Status   MRSA by PCR NEGATIVE NEGATIVE Final    Comment:        The GeneXpert MRSA Assay (FDA approved for NASAL specimens only), is one component of a comprehensive MRSA colonization surveillance program. It is not intended to diagnose MRSA infection nor to guide or monitor treatment for MRSA infections. Performed at [facility], [place] [address], [place], [place] [address]   Aerobic/Anaerobic Culture (surgical/deep wound)     Status: None (Preliminary result)   Collection Time: 02/16/18  1:14 AM  Result Value Ref Range Status   Specimen Description WOUND RIGHT EAR  Final   Special Requests NONE  Final   Gram Stain   Final    ABUNDANT WBC PRESENT, PREDOMINANTLY PMN NO ORGANISMS SEEN Performed at [facility], [place] [address] [place], [place] [address]    Culture   Final    RARE PSEUDOMONAS AERUGINOSA NO ANAEROBES ISOLATED; CULTURE IN PROGRESS FOR 5 DAYS    Report Status PENDING  Incomplete   Organism ID, Bacteria PSEUDOMONAS AERUGINOSA  Final      Susceptibility   Pseudomonas aeruginosa - MIC*    CEFTAZIDIME 2 SENSITIVE Sensitive     CIPROFLOXACIN <=0.25 SENSITIVE Sensitive     GENTAMICIN <=1 SENSITIVE Sensitive     IMIPENEM 1 SENSITIVE Sensitive     PIP/TAZO 8 SENSITIVE Sensitive     CEFEPIME 2 SENSITIVE Sensitive     * RARE PSEUDOMONAS AERUGINOSA  Surgical pcr screen     Status: None   Collection Time: 02/16/18  3:08 PM  Result Value Ref Range Status   MRSA, PCR NEGATIVE NEGATIVE Final   Staphylococcus aureus NEGATIVE NEGATIVE  Final    Comment: (NOTE) The Xpert SA Assay (FDA approved for NASAL specimens in patients 3 years of age and older), is one component of a comprehensive surveillance program. It is not intended to diagnose infection nor to guide or monitor treatment. Performed at [facility], [place] [address] [place], [place] [address]   Fungus Culture With Stain     Status: None (Preliminary result)   Collection Time: 02/16/18  9:40 PM  Result Value Ref Range Status   Fungus Stain Final report  Final    Comment: (NOTE) Performed At: [facility] [place], [place] [id number] Rush Farmer MD PR:[id number]    Fungus (Mycology) Culture PENDING  Incomplete   Fungal Source WOUND  Final    Comment: RT MASTOI D DRAINAGE  Aerobic/Anaerobic Culture (surgical/deep wound)     Status: None (Preliminary result)   Collection Time: 02/16/18  9:40 PM  Result Value Ref Range Status   Specimen Description WOUND  Final   Special Requests RT MASTOID DRAINAGE  Final   Gram Stain   Final    ABUNDANT WBC PRESENT,BOTH PMN AND MONONUCLEAR NO ORGANISMS SEEN Performed at [facility], [address], [place], [place] [address]    Culture FEW PSEUDOMONAS AERUGINOSA   Final   Report Status PENDING  Incomplete   Organism ID, Bacteria PSEUDOMONAS AERUGINOSA  Final      Susceptibility   Pseudomonas aeruginosa - MIC*    CEFTAZIDIME 2 SENSITIVE Sensitive     CIPROFLOXACIN <=0.25 SENSITIVE Sensitive     GENTAMICIN <=1 SENSITIVE Sensitive     IMIPENEM 2 SENSITIVE Sensitive     PIP/TAZO 8 SENSITIVE Sensitive     CEFEPIME 4 SENSITIVE Sensitive     * FEW PSEUDOMONAS AERUGINOSA  Fungus Culture Result     Status: None   Collection Time: 02/16/18  9:40 PM  Result Value Ref Range Status   Result 1 Comment  Final    Comment: (NOTE) KOH/Calcofluor preparation:  no fungus observed. Performed At: [facility] [place], [place] [id number] Rush Farmer MD SP:[id number]      Labs: Basic Metabolic Panel: Recent Labs  Lab 02/15/18 1449 02/17/18 0434 02/18/18 0416 02/20/18 0604  NA 134* 135 135 138  K 4.3 4.2 3.8 3.5  CL 98 98 97* 101  CO2 27 24 26 30   GLUCOSE 79 168* 236* 205*  BUN 28* 21 14 15   CREATININE 0.78 0.93 0.98 0.75  CALCIUM 10.5* 9.2 9.4 9.0   Liver Function Tests: No results for input(s): AST, ALT, ALKPHOS, BILITOT, PROT, ALBUMIN in the last 168 hours. No results for input(s): LIPASE, AMYLASE in the last 168 hours. No results for input(s): AMMONIA in the last 168 hours. CBC: Recent Labs  Lab 02/15/18 1449  02/17/18 0434 02/18/18 0416 02/19/18 0454 02/20/18 0604 02/21/18 0528  WBC 20.5*   < > 17.0* 16.4* 16.7* 14.9* 16.6*  NEUTROABS 8.4*  --   --   --   --   --   --   HGB 12.7*   < > 12.3* 12.5* 11.2* 10.5* 10.5*  HCT 40.1   < > 38.8* 39.2 34.9* 34.5* 33.9*  MCV 86.8   < > 86.2 85.2 86.0 86.7 86.5  PLT 358   < > 301 307 264 269 257   < > = values in this interval not displayed.   Cardiac Enzymes: No results for input(s): CKTOTAL, CKMB, CKMBINDEX, TROPONINI in the last 168 hours. BNP:  BNP (last 3 results) Recent Labs    06/29/17 1400 02/15/18 1449  BNP 179.0* 83.0    ProBNP (last 3 results) No  results for input(s): PROBNP in the last 8760 hours.  CBG: Recent Labs  Lab 02/20/18 0633 02/20/18 1207 02/20/18 1653 02/20/18 2117 02/21/18 0618  GLUCAP 188* 226* 244* 243* 177*       Signed:  Jinna Weinman  [facility] 02/21/2018, 10:22 AM

## 2018-02-22 ENCOUNTER — Encounter: Payer: Self-pay | Admitting: Internal Medicine

## 2018-02-22 ENCOUNTER — Non-Acute Institutional Stay (SKILLED_NURSING_FACILITY): Payer: Medicare Other | Admitting: Internal Medicine

## 2018-02-22 DIAGNOSIS — M869 Osteomyelitis, unspecified: Secondary | ICD-10-CM

## 2018-02-22 DIAGNOSIS — E785 Hyperlipidemia, unspecified: Secondary | ICD-10-CM

## 2018-02-22 DIAGNOSIS — I482 Chronic atrial fibrillation, unspecified: Secondary | ICD-10-CM

## 2018-02-22 DIAGNOSIS — Z794 Long term (current) use of insulin: Secondary | ICD-10-CM

## 2018-02-22 DIAGNOSIS — E1165 Type 2 diabetes mellitus with hyperglycemia: Secondary | ICD-10-CM

## 2018-02-22 DIAGNOSIS — I5032 Chronic diastolic (congestive) heart failure: Secondary | ICD-10-CM | POA: Diagnosis not present

## 2018-02-22 NOTE — Progress Notes (Signed)
Provider: Veleta Miners MD  Location:    [facility] Room Number: 129/P Place of Service:  SNF (31)  PCP: [name], MD Patient Care Team: [name], MD as PCP - General (Internal Medicine)  Extended Emergency Contact Information Primary Emergency Contact: [place] Address: [address]          [place], [place] [address] Johnnette Litter of [place] Phone: [phone] Mobile Phone: [phone] Relation: Spouse  Code Status: Full Code Goals of Care: Advanced Directive information Advanced Directives 02/22/2018  Does Patient Have a Medical Advance Directive? Yes  Type of Advance Directive (No Data)  Does patient want to make changes to medical advance directive? No - Patient declined  Copy of [place] in Chart? No - copy requested  Would patient like information on creating a medical advance directive? -  Pre-existing out of facility DNR order (yellow form or pink MOST form) -      Chief Complaint  Patient presents with  . New Admit To SNF    New Admission Visit    HPI: Patient is a 83 y.o. male seen today for admission to SNF for IV antibiotics.  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  Patient has a history of diabetes mellitus, diastolic CHF, hypertension, chronic atrial fibrillation on chronic Coumadin, HLD, CLL.  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  He also has thrombus within the right sigmoid sinus and transverse sinus. He underwent Right revision mastoidectomy  with drainage of abscess.on 11/13 His cultures grew pansensitive Pseudomonas and he is on meropenem for 4 weeks.  With possible p.o. antibiotics after that. Patient is stable in the facility.  His only complaint was the dressing for his ear.  He is denying any pain fever or chills.  He does have some discharge from his Right ear. Is working with therapy and is already walking with a walker. He lives with his wife who is the primary caregiver.    Past Medical History:  Diagnosis Date  . Atrial fibrillation ([place])   . Chronic lymphocytic leukemia ([place])   . Coronary atherosclerosis of native coronary artery    Nonobstructive  . Essential hypertension, benign   . Hyperlipidemia   . IgA nephropathy    Dr. Lowanda Foster  . Type 2 diabetes mellitus ([place])   . UTI (urinary tract infection) july  10 th   taking  med    Past Surgical History:  Procedure Laterality Date  . AMPUTATION Left 09/15/2012   Procedure: PARTIAL AMPUTATION 3rd TOE LEFT FOOT;  Surgeon: Marcheta Grammes, DPM;  Location: [facility];  Service: Orthopedics;  Laterality: Left;  . AMPUTATION Left 04/20/2013   Procedure: PARTIAL AMPUTATION 2ND TOE LEFT FOOT;  Surgeon: Marcheta Grammes, DPM;  Location: [facility];  Service: Orthopedics;  Laterality: Left;  . ANAL FISSURE REPAIR    . APPLICATION OF WOUND VAC Right 09/09/2016   Procedure: APPLICATION OF WOUND VAC;  Surgeon: Caprice Beaver, DPM;  Location: [facility];  Service: Podiatry;  Laterality: Right;  . CATARACT EXTRACTION W/PHACO  Right 09/30/2015   Procedure: CATARACT EXTRACTION PHACO AND INTRAOCULAR LENS PLACEMENT (IOC);  Surgeon: Tonny Branch, MD;  Location: [facility];  Service: Ophthalmology;  Laterality: Right;  CDE: 11.78  . CATARACT EXTRACTION W/PHACO Left 10/31/2015   Procedure: CATARACT EXTRACTION PHACO AND INTRAOCULAR LENS PLACEMENT LEFT EYE; CDE:  9.10;  Surgeon: Tonny Branch, MD;  Location: [facility];  Service: Ophthalmology;  Laterality: Left;  . COLONOSCOPY  08/19/2011    Procedure: COLONOSCOPY;  Surgeon: Rogene Houston, MD;  [facility];  Service: Endoscopy;  Laterality: N/A;  930  . COLONOSCOPY N/A 09/19/2014   Procedure: COLONOSCOPY;  Surgeon: Rogene Houston, MD;  [facility];  Service: Endoscopy;  Laterality: N/A;  1055  . FEMORAL HERNIA REPAIR    . INCISION AND DRAINAGE Right 09/09/2016   Procedure: DEBRIDEMENT WOUND RT heel with wound vac attachment;  Surgeon: Caprice Beaver, DPM;  Location: [facility];  Service: Podiatry;  Laterality: Right;  . INCISION AND DRAINAGE ABSCESS Right 02/16/2018   Procedure: INCISION AND DRAINAGE ABSCESS;  Surgeon: Leta Baptist, MD;  Location: [facility];  Service: ENT;  Laterality: Right;  . INCISION AND DRAINAGE OF WOUND Right 08/12/2016   Procedure: DEBRIDEMENT WOUND RIGHT HEEL;  Surgeon: Caprice Beaver, DPM;  Location: [facility];  Service: Podiatry;  Laterality: Right;  right heel  . MASS EXCISION Right 09/28/2017   Procedure: EXCISION OF EXTERNAL AUDITORY CANAL MASS;  Surgeon: Leta Baptist, MD;  Location: [facility];  Service: ENT;  Laterality: Right;  . MASTOIDECTOMY Right 02/16/2018   Procedure: REVISION MASTOIDECTOMY;  Surgeon: Leta Baptist, MD;  Location: [facility];  Service: ENT;  Laterality: Right;  . Open repair left quadriceps tendon.  08/22/07   Dr. Aline Brochure  . Right total knee replacement    . TRANSURETHRAL RESECTION OF PROSTATE    . TYMPANOMASTOIDECTOMY Right 11/10/2017   Procedure: RIGHT TYMPANOMASTOIDECTOMY;  Surgeon: Leta Baptist, MD;  Location: [facility];  Service: ENT;  Laterality: Right;  . WOUND EXPLORATION Right 08/12/2016   Procedure: EXPLORATION OF WOUND FOR FOREIGN BODY RIGHT HEEL;  Surgeon: Caprice Beaver, DPM;  Location: [facility];  Service: Podiatry;  Laterality: Right;  right heel    reports that he quit smoking about 54 years ago. His smoking use included cigarettes. He quit after 10.00 years of use. He has never used smokeless tobacco. He reports that he does not drink  alcohol or use drugs. Social History   Socioeconomic History  . Marital status: Married    Spouse name: Not on file  . Number of children: Not on file  . Years of education: Not on file  . Highest education level: Not on file  Occupational History  . Occupation: Retired    Fish farm manager: RETIRED    Comment: [employer]  . Financial resource strain: Not on file  . Food insecurity:    Worry: Not on file    Inability: Not on file  . Transportation needs:    Medical: Not on file    Non-medical: Not on file  Tobacco Use  . Smoking status: Former Smoker    Years: 10.00    Types: Cigarettes    Last attempt to quit: 04/07/1963    Years since quitting: 54.9  . Smokeless tobacco: Never Used  Substance and Sexual Activity  . Alcohol use: No    Alcohol/week: 0.0 standard drinks  . Drug use: No  . Sexual activity: Not on file  Lifestyle  . Physical  activity:    Days per week: Not on file    Minutes per session: Not on file  . Stress: Not on file  Relationships  . Social connections:    Talks on phone: Not on file    Gets together: Not on file    Attends religious service: Not on file    Active member of club or organization: Not on file    Attends meetings of clubs or organizations: Not on file    Relationship status: Not on file  . Intimate partner violence:    Fear of current or ex partner: Not on file    Emotionally abused: Not on file    Physically abused: Not on file    Forced sexual activity: Not on file  Other Topics Concern  . Not on file  Social History Narrative  . Not on file    Functional Status Survey:    Family History  Problem Relation Age of Onset  . Atrial fibrillation Mother     Health Maintenance  Topic Date Due  . FOOT EXAM  03/24/2018 (Originally 10/14/1944)  . OPHTHALMOLOGY EXAM  03/24/2018 (Originally 10/14/1944)  . HEMOGLOBIN A1C  06/14/2018  . PNA vac Low Risk Adult (2 of 2 - PPSV23) 01/25/2019  . TETANUS/TDAP  01/13/2028  .  INFLUENZA VACCINE  Completed    Allergies  Allergen Reactions  . Erythromycin Other (See Comments)  . Penicillins Hives and Other (See Comments)    Has patient had a PCN reaction causing immediate rash, facial/tongue/throat swelling, SOB or lightheadedness with hypotension: No Has patient had a PCN reaction causing severe rash involving mucus membranes or skin necrosis: No Has patient had a PCN reaction that required hospitalization No Has patient had a PCN reaction occurring within the last 10 years: No If all of the above answers are "NO", then may proceed with Cephalosporin use.  Has tolerated Rocephin    Allergies as of 02/22/2018      Reactions   Erythromycin Other (See Comments)   Penicillins Hives, Other (See Comments)   Has patient had a PCN reaction causing immediate rash, facial/tongue/throat swelling, SOB or lightheadedness with hypotension: No Has patient had a PCN reaction causing severe rash involving mucus membranes or skin necrosis: No Has patient had a PCN reaction that required hospitalization No Has patient had a PCN reaction occurring within the last 10 years: No If all of the above answers are "NO", then may proceed with Cephalosporin use. Has tolerated Rocephin      Medication List        Accurate as of 02/22/18  8:50 AM. Always use your most recent med list.          atorvastatin 10 MG tablet Commonly known as:  LIPITOR Take 10 mg by mouth every evening.   bisacodyl 5 MG EC tablet Commonly known as:  DULCOLAX Take 1 tablet (5 mg total) by mouth daily as needed for moderate constipation.   CALCIUM 600+D 600-800 MG-UNIT Tabs Generic drug:  Calcium Carb-Cholecalciferol Take 2 tablets by mouth daily with breakfast.   cholecalciferol 1000 units tablet Commonly known as:  VITAMIN D Take 1,000 Units by mouth every morning.   ciclopirox 0.77 % cream Commonly known as:  LOPROX Apply 1 application topically every morning. APPLY TO THE FEET   Co  Q-10 100 MG Caps Take 100 mg by mouth daily with breakfast.   digoxin 0.125 MG tablet Commonly known as:  LANOXIN Take 0.125 mg by mouth daily with  breakfast.   feeding supplement (ENSURE ENLIVE) Liqd Take 237 mLs by mouth 2 (two) times daily between meals.   finasteride 5 MG tablet Commonly known as:  PROSCAR Take 5 mg by mouth every morning.   Fish Oil 1200 MG Caps Take 2 capsules by mouth 2 (two) times daily.   furosemide 20 MG tablet Commonly known as:  LASIX Take 20-40 mg by mouth See admin instructions. Take 20 mg by mouth on Sun, Tues, Thurs, Sat, and 40 mg on Mon, Wed, Fri.   insulin aspart 100 UNIT/ML FlexPen Commonly known as:  NOVOLOG Inject 0-4 Units into the skin 3 (three) times daily with meals. CBG 121 - 150: 1 unit,  CBG 151 - 200: 2 units,  CBG 201 - 250: 3 units,  CBG 251 - 300: 5 units,  CBG 301 - 350: 7 units,  CBG 351 - 400: 9 units   Insulin Detemir 100 UNIT/ML Pen Commonly known as:  LEVEMIR Inject 20 Units into the skin daily.   lisinopril 20 MG tablet Commonly known as:  PRINIVIL,ZESTRIL Take 40 mg by mouth daily with supper.   LORazepam 1 MG tablet Commonly known as:  ATIVAN Take 1 tablet (1 mg total) by mouth at bedtime as needed for sleep.   meropenem 2 g in sodium chloride 0.9 % 100 mL Inject 2 g into the vein every 8 (eight) hours.   multivitamin with minerals Tabs tablet Take 1 tablet by mouth daily with breakfast.   pioglitazone 15 MG tablet Commonly known as:  ACTOS Take 7.5 mg by mouth every evening.   potassium chloride SA 20 MEQ tablet Commonly known as:  K-DUR,KLOR-CON Take 40 mEq by mouth daily. Take starting 01/07/2018   traMADol 50 MG tablet Commonly known as:  ULTRAM Take 1 tablet (50 mg total) by mouth every 6 (six) hours as needed for moderate pain.   VICTOZA 18 MG/3ML Sopn Generic drug:  liraglutide Inject 1.8 mg into the skin daily with breakfast.   vitamin C 1000 MG tablet Take 1,000 mg by mouth daily with  breakfast.   vitamin E 400 UNIT capsule Take 400 Units by mouth every morning.   warfarin 5 MG tablet Commonly known as:  COUMADIN Take as directed by the anticoagulation clinic. If you are unsure how to take this medication, talk to your nurse or doctor. Original instructions:  Take 1 tablet daily or as directed       Review of Systems  Review of Systems  Constitutional: Negative for activity change, appetite change, chills, diaphoresis, fatigue and fever.  HENT: Negative for mouth sores, postnasal drip, rhinorrhea, sinus pain and sore throat.   Respiratory: Negative for apnea, cough, chest tightness, shortness of breath and wheezing.   Cardiovascular: Negative for chest pain, palpitations and leg swelling.  Gastrointestinal: Negative for abdominal distention, abdominal pain, constipation, diarrhea, nausea and vomiting.  Genitourinary: Negative for dysuria and frequency.  Musculoskeletal: Negative for arthralgias, joint swelling and myalgias.  Skin: Negative for rash.  Neurological: Negative for dizziness, syncope, weakness, light-headedness and numbness.  Psychiatric/Behavioral: Negative for behavioral problems, confusion and sleep disturbance.     Vitals:   02/22/18 0828  BP: (!) 106/50  Pulse: 76  Resp: 20  Temp: 97.9 F (36.6 C)  TempSrc: Oral   There is no height or weight on file to calculate BMI. Physical Exam  Constitutional: He is oriented to person, place, and time. He appears well-developed and well-nourished.  HENT:  Head: Normocephalic.  Mouth/Throat: Oropharynx is  clear and moist.  Eyes: Pupils are equal, round, and reactive to light.  Neck: Neck supple.  Cardiovascular: Normal rate. An irregular rhythm present.  Pulmonary/Chest: Effort normal and breath sounds normal. No stridor. No respiratory distress. He has no wheezes.  Abdominal: Soft. Bowel sounds are normal. He exhibits no distension. There is no tenderness. There is no guarding.    Musculoskeletal:  Mild edema Bilateral with Chronic Venous changes  Neurological: He is alert and oriented to person, place, and time.  Walking with Gilford Rile . Right sided facial Droop due to Bells Palsy.  Skin: Skin is warm and dry.  Psychiatric: He has a normal mood and affect. His behavior is normal. Thought content normal.    Labs reviewed: Basic Metabolic Panel: Recent Labs    02/17/18 0434 02/18/18 0416 02/20/18 0604  NA 135 135 138  K 4.2 3.8 3.5  CL 98 97* 101  CO2 24 26 30   GLUCOSE 168* 236* 205*  BUN 21 14 13   CREATININE 0.93 0.98 0.75  CALCIUM 9.2 9.4 9.0   Liver Function Tests: Recent Labs    06/29/17 1400 11/04/17 2028 12/14/17 0527  AST 32 18 19  ALT 47 20 17  ALKPHOS 97 86 98  BILITOT 1.2 1.3* 0.8  PROT 6.1* 6.1* 6.2*  ALBUMIN 3.3* 3.5 3.1*   No results for input(s): LIPASE, AMYLASE in the last 8760 hours. No results for input(s): AMMONIA in the last 8760 hours. CBC: Recent Labs    12/30/17 0700 01/11/18 0555  02/15/18 1449  02/19/18 0454 02/20/18 0604 02/21/18 0528  WBC 13.1* 14.6*   < > 20.5*   < > 16.7* 14.9* 16.6*  NEUTROABS 4.8 4.8  --  8.4*  --   --   --   --   HGB 12.1* 13.2   < > 12.7*   < > 11.2* 10.5* 10.5*  HCT 36.4* 40.2   < > 40.1   < > 34.9* 34.5* 33.9*  MCV 89.7 89.3   < > 86.8   < > 86.0 86.7 86.5  PLT 281 304   < > 358   < > 264 269 257   < > = values in this interval not displayed.   Cardiac Enzymes: Recent Labs    06/29/17 1400  TROPONINI 0.03*   BNP: Invalid input(s): POCBNP Lab Results  Component Value Date   HGBA1C 9.5 (H) 12/14/2017   No results found for: TSH Lab Results  Component Value Date   VITAMINB12 698 01/20/2018   No results found for: FOLATE No results found for: IRON, TIBC, FERRITIN  Imaging and Procedures obtained prior to SNF admission: Dg Eye Foreign Body  Result Date: 02/15/2018 CLINICAL DATA:  Metal working/exposure; clearance prior to MRI EXAM: ORBITS FOR FOREIGN BODY - 2 VIEW  COMPARISON:  10/26/2017 FINDINGS: There is no evidence of metallic foreign body within the orbits. No significant bone abnormality identified. Dental hardware evident. Sinuses are clear. Cervical spondylosis noted. IMPRESSION: No evidence of metallic foreign body within the orbits. Electronically Signed   By: Jerilynn Mages.  Shick M.D.   On: 02/15/2018 08:22   Mr Jodene Nam Head Wo Contrast  Result Date: 02/15/2018 CLINICAL DATA:  Right 6th nerve palsy. Diplopia with right eye vision loss. Right-sided facial droop. History of right ear malignant otitis externa, chronic mastoiditis with abscess, and facial nerve paralysis status post canal wall down tympanomastoidectomy. EXAM: MRI HEAD WITHOUT AND WITH CONTRAST MRA HEAD WITHOUT CONTRAST TECHNIQUE: Multiplanar, multiecho pulse sequences of the brain and  surrounding structures were obtained without and with intravenous contrast. Angiographic images of the head were obtained using MRA technique without contrast. CONTRAST:  10 mL Gadavist COMPARISON:  10/26/2017 brain MRI FINDINGS: MRI HEAD FINDINGS Brain: There is progressive, large volume fluid throughout the right mastoid air cells. There is new fluid in the right petrous apex with extensive surrounding enhancement which extends into the soft tissues inferior to the skull base. Enhancement also extends into the right suboccipital scalp soft tissues. There is new abnormal dural thickening and enhancement throughout the posterior fossa on the right including along the clivus measuring up to 5 mm in thickness. Within the dural thickening along the clivus in a right paramedian location is a nonenhancing focus measuring 7 x 2 x 13 mm (series 18, image 32 and series 20, image 9). Abnormal enhancement extends into Meckel's cave on the right as well as into the posterior aspect of the right cavernous sinus with dural thickening along the medial aspect of the right middle cranial fossa. No acute infarct or intracranial hemorrhage is  identified. There is moderate cerebral atrophy. No significant cerebral white matter disease is seen for age. Vascular: Major intracranial arterial flow voids are preserved. There is new loss of the right transverse and sigmoid sinus flow void with suggestion of nonocclusive thrombus on postcontrast images. Skull and upper cervical spine: New abnormal marrow precontrast T1 hypointensity and enhancement within the right aspect of the clivus and petrous apex consistent with osteomyelitis. Sinuses/Orbits: Mild mucosal thickening in the right ethmoid, maxillary, and sphenoid sinuses. Clear left mastoid air cells. Bilateral cataract extraction. Other: None. MRA HEAD FINDINGS The visualized distal vertebral arteries are patent to the basilar and codominant. Patent PICA and SCA origins are identified bilaterally. The basilar artery is widely patent. Posterior communicating arteries are not identified and may be diminutive or absent. PCAs are patent with symmetric branch vessel attenuation but no significant proximal stenosis. The internal carotid arteries are patent without evidence of flow limiting stenosis. The right ICA appears slightly narrowed just below the skull base as it courses through the above described inflammatory process. ACAs and MCAs are patent without evidence of significant M1 stenosis. Mildly diminished signal in the proximal M2 segments bilaterally is artifactual, however there may be superimposed mild-to-moderate left greater than right atherosclerotic irregularity and narrowing. Right A1 segment irregularity and proximal right A2 signal loss are favored to be artifactual. No aneurysm is identified. IMPRESSION: 1. Increased fluid throughout the right mastoid air cells now including the petrous apex with extensive surrounding enhancement consistent with petrous apicitis and skull base osteomyelitis. 2. New regional dural thickening most notable along the clivus with suspected small retroclival epidural  abscess. 3. New thrombus in the right transverse and right sigmoid sinuses. 4. No arterial large vessel occlusion or definite flow limiting proximal stenosis. These results were called by telephone at the time of interpretation on 02/15/2018 at 12:10 pm to Dr. Michel Bickers , who verbally acknowledged these results. Electronically Signed   By: Logan Bores M.D.   On: 02/15/2018 12:14   Mr Jeri Cos MG Contrast  Result Date: 02/15/2018 CLINICAL DATA:  Right 6th nerve palsy. Diplopia with right eye vision loss. Right-sided facial droop. History of right ear malignant otitis externa, chronic mastoiditis with abscess, and facial nerve paralysis status post canal wall down tympanomastoidectomy. EXAM: MRI HEAD WITHOUT AND WITH CONTRAST MRA HEAD WITHOUT CONTRAST TECHNIQUE: Multiplanar, multiecho pulse sequences of the brain and surrounding structures were obtained without and with intravenous contrast.  Angiographic images of the head were obtained using MRA technique without contrast. CONTRAST:  10 mL Gadavist COMPARISON:  10/26/2017 brain MRI FINDINGS: MRI HEAD FINDINGS Brain: There is progressive, large volume fluid throughout the right mastoid air cells. There is new fluid in the right petrous apex with extensive surrounding enhancement which extends into the soft tissues inferior to the skull base. Enhancement also extends into the right suboccipital scalp soft tissues. There is new abnormal dural thickening and enhancement throughout the posterior fossa on the right including along the clivus measuring up to 5 mm in thickness. Within the dural thickening along the clivus in a right paramedian location is a nonenhancing focus measuring 7 x 2 x 13 mm (series 18, image 32 and series 20, image 9). Abnormal enhancement extends into Meckel's cave on the right as well as into the posterior aspect of the right cavernous sinus with dural thickening along the medial aspect of the right middle cranial fossa. No acute infarct  or intracranial hemorrhage is identified. There is moderate cerebral atrophy. No significant cerebral white matter disease is seen for age. Vascular: Major intracranial arterial flow voids are preserved. There is new loss of the right transverse and sigmoid sinus flow void with suggestion of nonocclusive thrombus on postcontrast images. Skull and upper cervical spine: New abnormal marrow precontrast T1 hypointensity and enhancement within the right aspect of the clivus and petrous apex consistent with osteomyelitis. Sinuses/Orbits: Mild mucosal thickening in the right ethmoid, maxillary, and sphenoid sinuses. Clear left mastoid air cells. Bilateral cataract extraction. Other: None. MRA HEAD FINDINGS The visualized distal vertebral arteries are patent to the basilar and codominant. Patent PICA and SCA origins are identified bilaterally. The basilar artery is widely patent. Posterior communicating arteries are not identified and may be diminutive or absent. PCAs are patent with symmetric branch vessel attenuation but no significant proximal stenosis. The internal carotid arteries are patent without evidence of flow limiting stenosis. The right ICA appears slightly narrowed just below the skull base as it courses through the above described inflammatory process. ACAs and MCAs are patent without evidence of significant M1 stenosis. Mildly diminished signal in the proximal M2 segments bilaterally is artifactual, however there may be superimposed mild-to-moderate left greater than right atherosclerotic irregularity and narrowing. Right A1 segment irregularity and proximal right A2 signal loss are favored to be artifactual. No aneurysm is identified. IMPRESSION: 1. Increased fluid throughout the right mastoid air cells now including the petrous apex with extensive surrounding enhancement consistent with petrous apicitis and skull base osteomyelitis. 2. New regional dural thickening most notable along the clivus with  suspected small retroclival epidural abscess. 3. New thrombus in the right transverse and right sigmoid sinuses. 4. No arterial large vessel occlusion or definite flow limiting proximal stenosis. These results were called by telephone at the time of interpretation on 02/15/2018 at 12:10 pm to Dr. Michel Bickers , who verbally acknowledged these results. Electronically Signed   By: Logan Bores M.D.   On: 02/15/2018 12:14    Assessment/Plan  Osteomyelitis of skull On Meropenem 2 g every 8 hours for a total of 4 weeks till 12/24. Patient has a follow-up appointment with Dr. Lorelee Cover Also has follow-up with infectious disease.  Uncontrolled type 2 diabetes mellitus Patient on long-acting insulin, Actos, Victoza His blood sugars are running more than 200  he is on strict sliding scale insulin with every meal  Atrial fibrillation, chronic On chronic Coumadin Rate controlled on Digoxin We will follow PT/INR  Chronic diastolic  CHF (congestive heart failure)  On Lasix We will follow weights Baseline weight of 246 lbs.  Hyperlipidemia,  On atorvastatin Essential Hypertension Stable on Lisinopril and Lasix    Family/ staff Communication:   Labs/tests ordered:  Total time spent in this patient care encounter was 45_ minutes; greater than 50% of the visit spent counseling patient, reviewing records , Labs and coordinating care for problems addressed at this encounter.

## 2018-02-24 ENCOUNTER — Other Ambulatory Visit: Payer: Self-pay

## 2018-02-24 ENCOUNTER — Encounter (HOSPITAL_COMMUNITY)
Admission: RE | Admit: 2018-02-24 | Discharge: 2018-02-24 | Disposition: A | Discharge status: Home / Self Care | Payer: Medicare Other | Source: Skilled Nursing Facility | Attending: Internal Medicine | Admitting: Internal Medicine

## 2018-02-24 LAB — PROTIME-INR
INR: 1.95
PROTHROMBIN TIME: 22 s — AB (ref 11.4–15.2)

## 2018-02-24 MED ORDER — LORAZEPAM 1 MG PO TABS: 1.0000 mg | ORAL_TABLET | Freq: Every evening | ORAL | 0 refills | Status: DC | PRN

## 2018-02-24 MED ORDER — TRAMADOL HCL 50 MG PO TABS: 50.0000 mg | ORAL_TABLET | Freq: Four times a day (QID) | ORAL | 0 refills | Status: DC | PRN

## 2018-02-24 NOTE — Telephone Encounter (Signed)
RX Fax for [facility]@ [phone]  

## 2018-02-28 ENCOUNTER — Other Ambulatory Visit (HOSPITAL_COMMUNITY)
Admission: RE | Admit: 2018-02-28 | Discharge: 2018-02-28 | Disposition: A | Discharge status: Home / Self Care | Payer: Medicare Other | Source: Skilled Nursing Facility | Attending: Internal Medicine | Admitting: Internal Medicine

## 2018-02-28 ENCOUNTER — Ambulatory Visit (INDEPENDENT_AMBULATORY_CARE_PROVIDER_SITE_OTHER): Payer: Medicare Other | Admitting: Otolaryngology

## 2018-02-28 LAB — PROTIME-INR
INR: 1.86
PROTHROMBIN TIME: 21.2 s — AB (ref 11.4–15.2)

## 2018-03-01 ENCOUNTER — Ambulatory Visit: Payer: Medicare Other | Admitting: Cardiology

## 2018-03-04 ENCOUNTER — Encounter (HOSPITAL_COMMUNITY)
Admission: RE | Admit: 2018-03-04 | Discharge: 2018-03-04 | Disposition: A | Discharge status: Home / Self Care | Payer: Medicare Other | Source: Skilled Nursing Facility | Attending: Internal Medicine | Admitting: Internal Medicine

## 2018-03-04 DIAGNOSIS — I482 Chronic atrial fibrillation, unspecified: Secondary | ICD-10-CM | POA: Insufficient documentation

## 2018-03-04 DIAGNOSIS — Z7901 Long term (current) use of anticoagulants: Secondary | ICD-10-CM | POA: Insufficient documentation

## 2018-03-04 LAB — PROTIME-INR
INR: 2
PROTHROMBIN TIME: 22.5 s — AB (ref 11.4–15.2)

## 2018-03-10 ENCOUNTER — Encounter (HOSPITAL_COMMUNITY)
Admission: RE | Admit: 2018-03-10 | Discharge: 2018-03-10 | Disposition: A | Discharge status: Home / Self Care | Payer: Medicare Other | Source: Skilled Nursing Facility | Attending: Internal Medicine | Admitting: Internal Medicine

## 2018-03-10 DIAGNOSIS — Z48810 Encounter for surgical aftercare following surgery on the sense organs: Secondary | ICD-10-CM | POA: Insufficient documentation

## 2018-03-10 DIAGNOSIS — M8618 Other acute osteomyelitis, other site: Secondary | ICD-10-CM | POA: Insufficient documentation

## 2018-03-10 DIAGNOSIS — Z7901 Long term (current) use of anticoagulants: Secondary | ICD-10-CM | POA: Insufficient documentation

## 2018-03-11 ENCOUNTER — Encounter (HOSPITAL_COMMUNITY)
Admission: RE | Admit: 2018-03-11 | Payer: Medicare Other | Source: Skilled Nursing Facility | Attending: *Deleted | Admitting: *Deleted

## 2018-03-11 ENCOUNTER — Encounter (HOSPITAL_COMMUNITY): Payer: Self-pay

## 2018-03-11 ENCOUNTER — Encounter (HOSPITAL_COMMUNITY)
Admission: RE | Admit: 2018-03-11 | Discharge: 2018-03-11 | Disposition: A | Discharge status: Home / Self Care | Payer: Medicare Other | Source: Skilled Nursing Facility | Attending: *Deleted | Admitting: *Deleted

## 2018-03-11 ENCOUNTER — Inpatient Hospital Stay (HOSPITAL_COMMUNITY): Payer: Medicare Other

## 2018-03-11 LAB — PROTIME-INR
INR: 2.12
Prothrombin Time: 23.5 seconds — ABNORMAL HIGH (ref 11.4–15.2)

## 2018-03-14 ENCOUNTER — Ambulatory Visit (INDEPENDENT_AMBULATORY_CARE_PROVIDER_SITE_OTHER): Payer: Medicare Other | Admitting: Otolaryngology

## 2018-03-18 ENCOUNTER — Ambulatory Visit (HOSPITAL_COMMUNITY)
Admission: RE | Admit: 2018-03-18 | Discharge: 2018-03-18 | Disposition: A | Discharge status: Home / Self Care | Payer: Medicare Other | Source: Skilled Nursing Facility | Attending: Otolaryngology | Admitting: Otolaryngology

## 2018-03-18 ENCOUNTER — Other Ambulatory Visit (HOSPITAL_COMMUNITY)
Admission: RE | Admit: 2018-03-18 | Discharge: 2018-03-18 | Disposition: A | Discharge status: Home / Self Care | Payer: Medicare Other | Source: Skilled Nursing Facility | Attending: *Deleted | Admitting: *Deleted

## 2018-03-18 DIAGNOSIS — H7091 Unspecified mastoiditis, right ear: Secondary | ICD-10-CM | POA: Diagnosis not present

## 2018-03-18 DIAGNOSIS — M8660 Other chronic osteomyelitis, unspecified site: Secondary | ICD-10-CM | POA: Diagnosis not present

## 2018-03-18 DIAGNOSIS — R29818 Other symptoms and signs involving the nervous system: Secondary | ICD-10-CM | POA: Diagnosis not present

## 2018-03-18 DIAGNOSIS — M869 Osteomyelitis, unspecified: Secondary | ICD-10-CM | POA: Diagnosis not present

## 2018-03-18 LAB — FUNGUS CULTURE WITH STAIN

## 2018-03-18 LAB — PROTIME-INR
INR: 2.54
PROTHROMBIN TIME: 27 s — AB (ref 11.4–15.2)

## 2018-03-18 LAB — FUNGAL ORGANISM REFLEX

## 2018-03-18 LAB — FUNGUS CULTURE RESULT

## 2018-03-18 IMAGING — US US RENAL
1 series · 13 of 25 positions shown · non-contrast
Comparison: None.

CLINICAL DATA: Gross hematuria and urinary tract infections

EXAM:
RENAL / URINARY TRACT ULTRASOUND COMPLETE

[Series 1: us renal · 0.25mm/px · 13 of 48 slices shown]
[im 1/48]
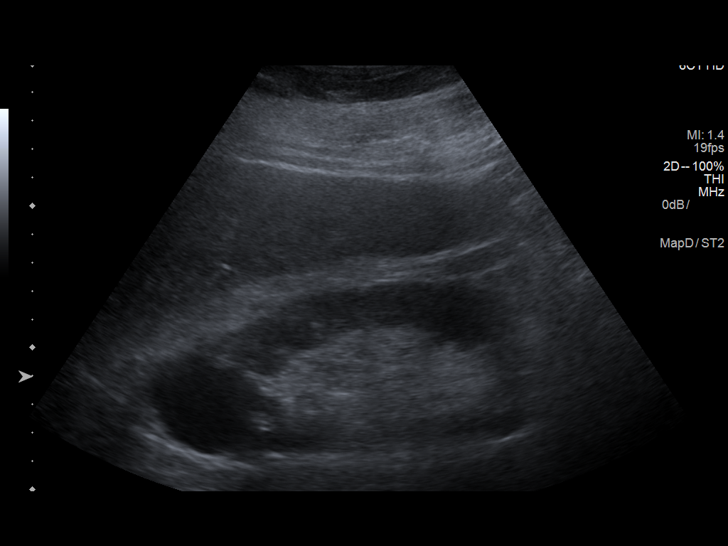
[im 4/48]
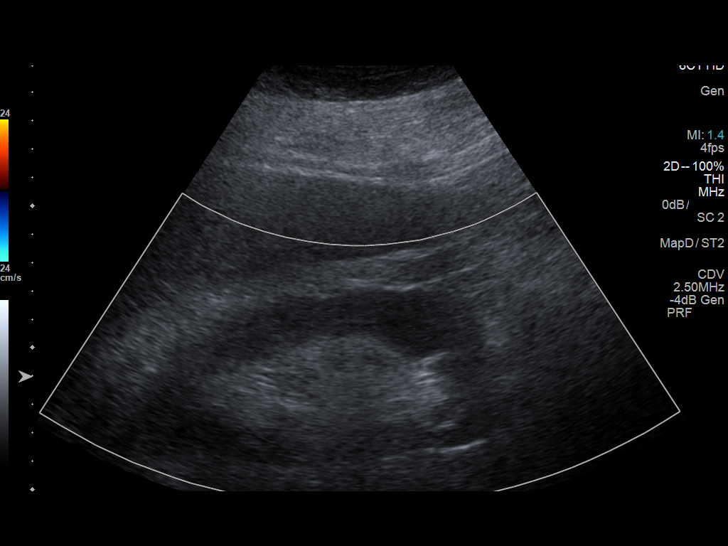
[im 8/48]
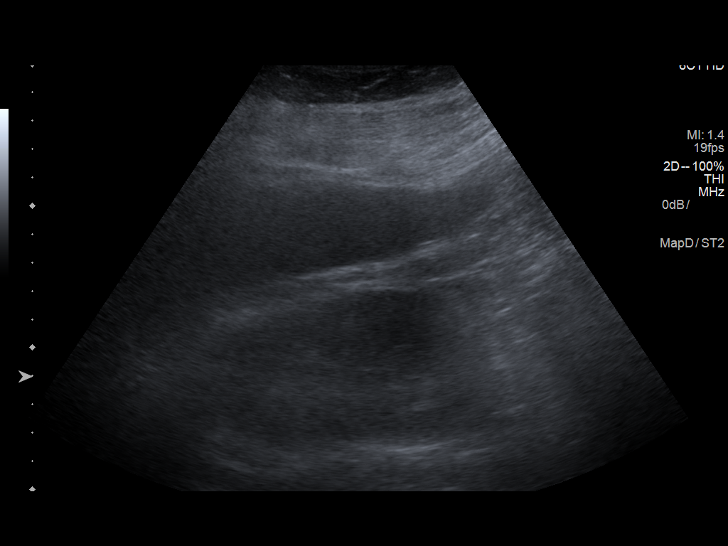
[im 12/48]
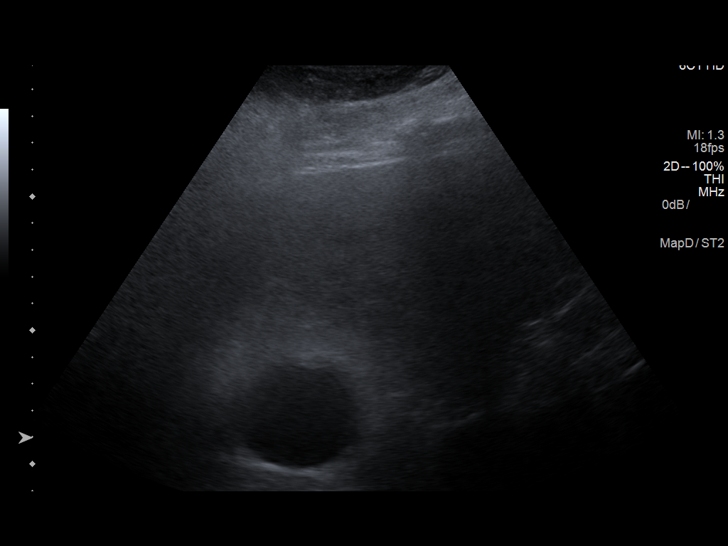
[im 16/48]
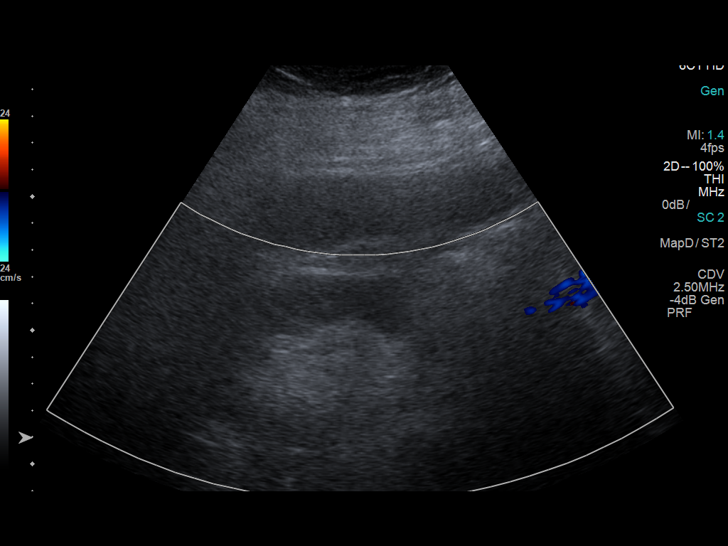
[im 20/48]
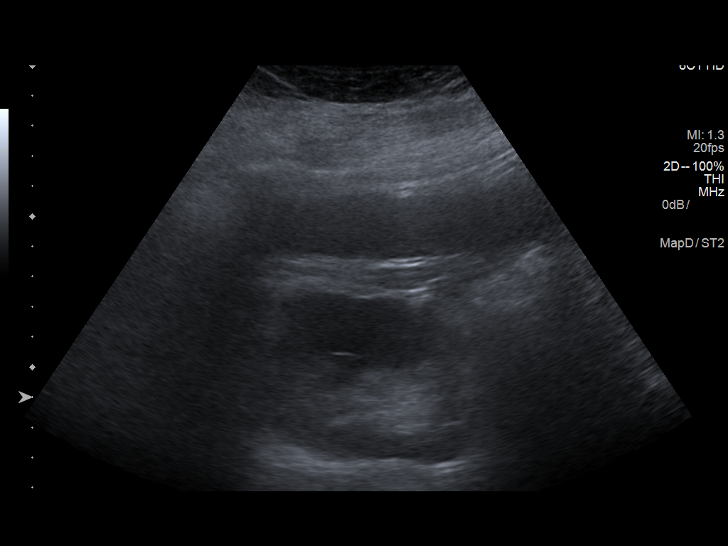
[im 24/48]
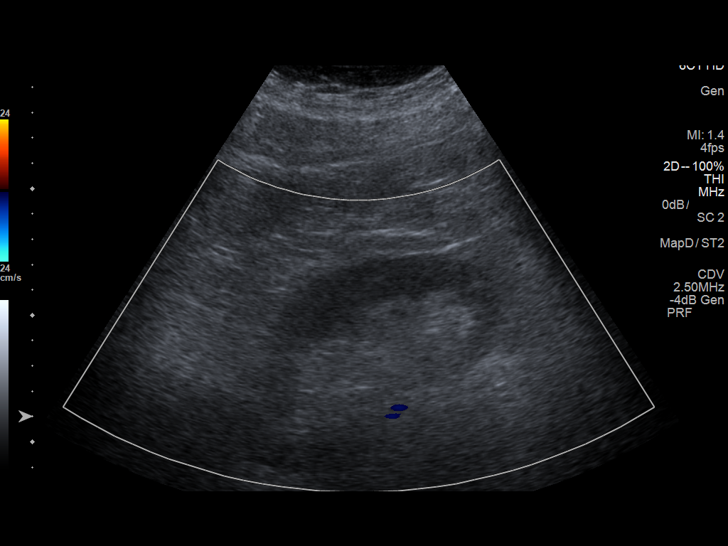
[im 28/48]
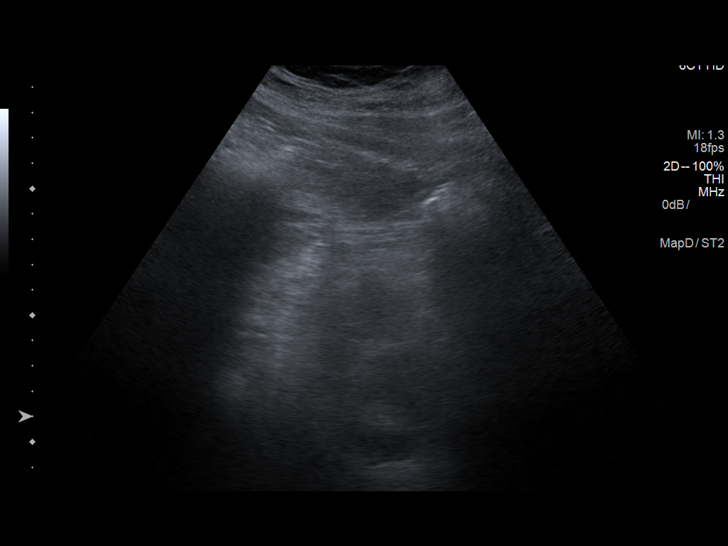
[im 32/48]
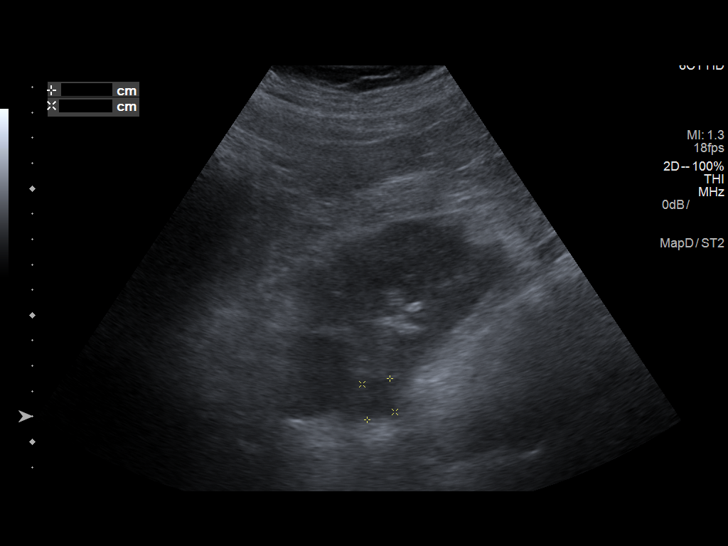
[im 36/48]
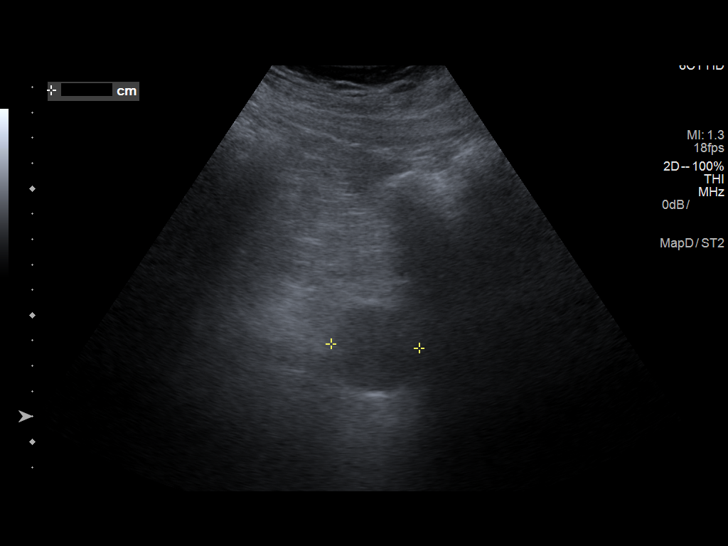
[im 40/48]
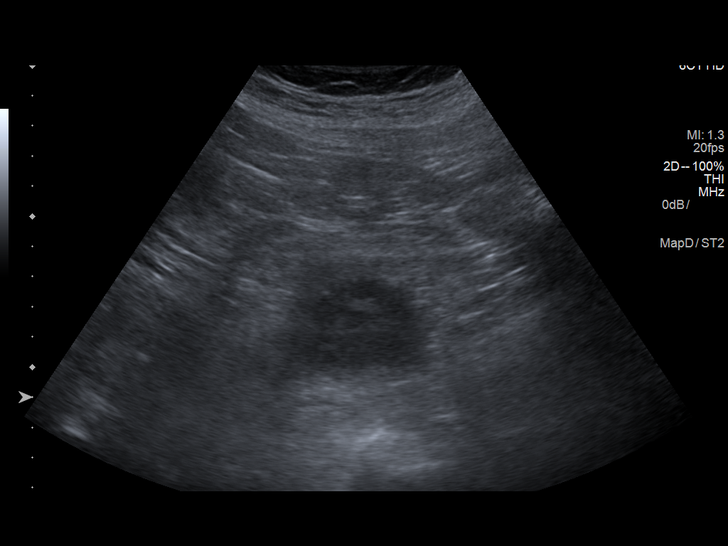
[im 44/48]
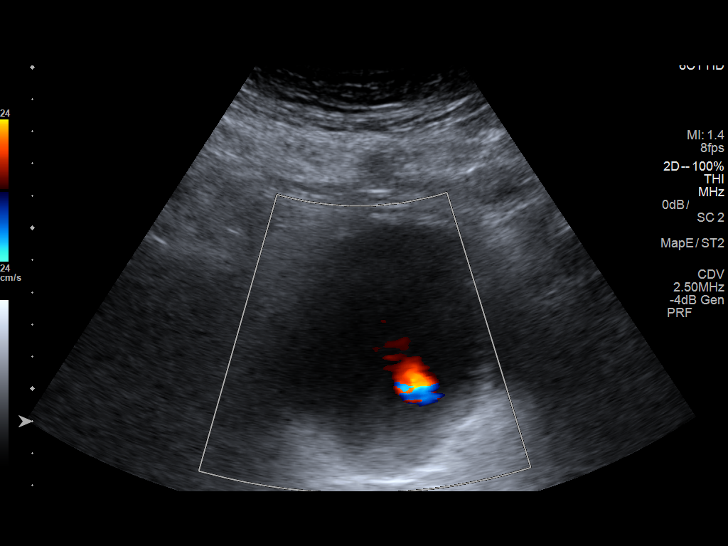
[im 48/48]
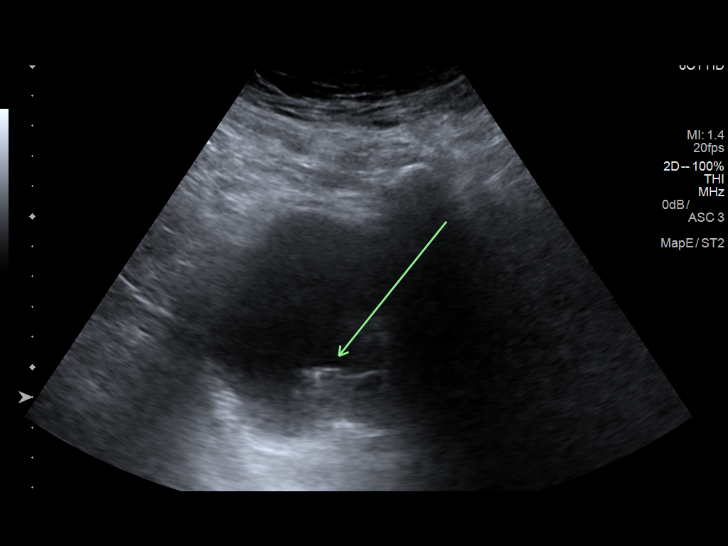

[13 of 25 positions shown; findings below may reference images not displayed]

FINDINGS: Right Kidney:

Length: 13.6 cm. Echogenicity and renal cortical thickness are
within normal limits. No perinephric fluid or hydronephrosis
visualized. There is a cyst arising from the upper pole the right
kidney measuring 5.3 x 3.9 x 4.3 cm. A cyst arising from the lower
pole of the right kidney measures 2.3 x 2.6 x 2.7 cm. No
sonographically demonstrable calculus or ureterectasis.

Left Kidney:

Length: 13.2 cm. Echogenicity and renal cortical thickness are
within normal limits. No perinephric fluid or hydronephrosis
visualized. There is a cyst arising from the upper pole of the left
kidney measuring 2.6 x 2.4 x 3.7 cm. There is a nearby cyst
measuring 1.9 x 1.7 x 1.7 cm. No sonographically demonstrable
calculus or ureterectasis.

Bladder:

There is irregular debris noted in the urinary bladder. Urinary
bladder wall appears mildly thickened. Flow from the distal ureters
is seen in the bladder. The patient was unable to void.
IMPRESSION: Complex debris noted within the urinary bladder with mild urinary
bladder wall thickening. Suspect cystitis. Note that patient was
unable to void; a finding concerning for bladder outlet obstruction.
Etiology for bladder outlet obstruction uncertain.

Cysts noted in each kidney.  No obstructing focus in either kidney.

## 2018-03-18 MED ORDER — GADOBUTROL 1 MMOL/ML IV SOLN
10.0000 mL | Freq: Once | INTRAVENOUS | Status: AC | PRN
  Administered 2018-03-18: 10 mL via INTRAVENOUS

## 2018-03-21 ENCOUNTER — Encounter: Payer: Self-pay | Admitting: Internal Medicine

## 2018-03-21 ENCOUNTER — Ambulatory Visit (INDEPENDENT_AMBULATORY_CARE_PROVIDER_SITE_OTHER): Payer: Medicare Other | Admitting: Internal Medicine

## 2018-03-21 VITALS — BP 122/67 | HR 82 | Temp 98.1°F | Wt 241.0 lb

## 2018-03-21 DIAGNOSIS — G062 Extradural and subdural abscess, unspecified: Secondary | ICD-10-CM | POA: Diagnosis not present

## 2018-03-21 DIAGNOSIS — I2510 Atherosclerotic heart disease of native coronary artery without angina pectoris: Secondary | ICD-10-CM

## 2018-03-21 DIAGNOSIS — G510 Bell's palsy: Secondary | ICD-10-CM | POA: Diagnosis not present

## 2018-03-21 DIAGNOSIS — Z7901 Long term (current) use of anticoagulants: Secondary | ICD-10-CM

## 2018-03-21 DIAGNOSIS — M869 Osteomyelitis, unspecified: Secondary | ICD-10-CM | POA: Diagnosis not present

## 2018-03-21 NOTE — Assessment & Plan Note (Signed)
Hopefully this will resolve with treatment completion

## 2018-03-21 NOTE — Progress Notes (Signed)
   Subjective:    Patient ID: [name] [name], male    DOB: 15-May-1934, 83 y.o.   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   Underwent right revision mastoidectomy with drainage of abscess by Dr. Benjamine Mola on 11/13 and 2 previous long courses of IV antibiotics.  Has now been on meropenem for Pseudomonas (pansensitive) since his hospitalization with a plan for 6 weeks through 12/24.  He is tolerating this well and is located at the [facility].  No associated rash or diarrhea. Here with his wife and daughter.  Less drainage in his ear.  No fever or chills.  MRI from 12/13 noted and discussed.  No worsening of infection and no new findings, all stable.     Review of Systems  Constitutional: Negative for chills, fatigue and fever.  Gastrointestinal: Negative for diarrhea and nausea.  Skin: Negative for rash.       Objective:   Physical Exam Constitutional:      Appearance: Normal appearance.  Eyes:     General: No scleral icterus. Cardiovascular:     Rate and Rhythm: Normal rate and regular rhythm.     Heart sounds: No murmur.  Pulmonary:     Effort: Pulmonary effort is normal. No respiratory distress.     Breath sounds: Normal breath sounds.  Musculoskeletal:        General: No swelling.  Skin:    Findings: No rash.  Neurological:     Mental Status: He is alert.   SH: at the [facility] until he completes his antibiotics        Assessment & Plan:

## 2018-03-21 NOTE — Assessment & Plan Note (Signed)
He is doing well with this clinically.  MRI noted and not worse or spreading.  Will continue with plan of meropenem through the 24th or 25th and then he will start high dose cipro at 750 mg twice a day for a projected 3 months.   Follow up with me in January

## 2018-03-21 NOTE — Assessment & Plan Note (Signed)
With cipro will need close monitoring of his INR

## 2018-03-21 NOTE — Assessment & Plan Note (Signed)
Continuing treatment as outlined for osteomyelitis.

## 2018-03-22 ENCOUNTER — Non-Acute Institutional Stay (SKILLED_NURSING_FACILITY): Payer: Medicare Other | Admitting: Internal Medicine

## 2018-03-22 ENCOUNTER — Encounter: Payer: Self-pay | Admitting: Internal Medicine

## 2018-03-22 DIAGNOSIS — E1169 Type 2 diabetes mellitus with other specified complication: Secondary | ICD-10-CM

## 2018-03-22 DIAGNOSIS — I10 Essential (primary) hypertension: Secondary | ICD-10-CM

## 2018-03-22 DIAGNOSIS — I5032 Chronic diastolic (congestive) heart failure: Secondary | ICD-10-CM | POA: Diagnosis not present

## 2018-03-22 DIAGNOSIS — M869 Osteomyelitis, unspecified: Secondary | ICD-10-CM | POA: Diagnosis not present

## 2018-03-22 NOTE — Progress Notes (Signed)
Location:    [employer] Room Number: 158/P Place of Service:  SNF [phone]) Provider: Veleta Miners MD  [name], MD  Patient Care Team: [name], MD as PCP - General (Internal Medicine)  Extended Emergency Contact Information Primary Emergency Contact: Halberstam,Norma Address: [address]          [place], [place] [address] Johnnette Litter of [place] Phone: [phone] Mobile Phone: [phone] Relation: Spouse  Code Status:  Full Code Goals of care: Advanced Directive information Advanced Directives 03/22/2018  Does Patient Have a Medical Advance Directive? Yes  Type of Advance Directive (No Data)  Does patient want to make changes to medical advance directive? No - Patient declined  Copy of [place] in Chart? No - copy requested  Would patient like information on creating a medical advance directive? -  Pre-existing out of facility DNR order (yellow form or pink MOST form) -     Chief Complaint  Patient presents with  . Acute Visit    F/U Visit    HPI:  Pt is a 83 y.o. 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  Patient has a history of diabetes mellitus, diastolic CHF, hypertension, chronic atrial fibrillation on chronic Coumadin, HLD, CLL.  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  He also has thrombus within the right sigmoid sinus and transverse sinus. He underwent Right revision mastoidectomy with drainage of abscess.on  11/13 His cultures grew pansensitive Pseudomonas and he is on meropenem for 4 weeks.  With possible p.o. antibiotics after that. He just had repeat MRI done which did not show much change but no worsening of his infection. Infectious disease has recommended PO Cipro after IV is done on 12/24 Patient had no complains except some worsening of his LE edema. No Chest pain , SOB or Cough. Is walking with therapy and walker   Past Medical History:  Diagnosis Date  . Atrial fibrillation ([place])   . Chronic lymphocytic leukemia ([place])   . Coronary atherosclerosis of native coronary artery    Nonobstructive  . Essential hypertension, benign   . Hyperlipidemia   . IgA nephropathy    Dr. Lowanda Foster  . Type 2 diabetes mellitus ([place])   . UTI (urinary tract infection) july  10 th   taking  med    Past Surgical History:  Procedure Laterality Date  . AMPUTATION Left 09/15/2012   Procedure: PARTIAL AMPUTATION 3rd TOE LEFT FOOT;  Surgeon: Marcheta Grammes, DPM;  Location: [facility];  Service: Orthopedics;  Laterality: Left;  . AMPUTATION Left 04/20/2013   Procedure: PARTIAL AMPUTATION 2ND TOE LEFT FOOT;  Surgeon: Marcheta Grammes, DPM;  Location: [facility];  Service: Orthopedics;  Laterality: Left;  . ANAL FISSURE REPAIR    . APPLICATION OF WOUND VAC Right 09/09/2016   Procedure: APPLICATION OF WOUND VAC;  Surgeon: Caprice Beaver, DPM;  Location: [facility];  Service: Podiatry;  Laterality: Right;  . CATARACT EXTRACTION W/PHACO Right 09/30/2015   Procedure: CATARACT  EXTRACTION PHACO AND INTRAOCULAR LENS PLACEMENT (IOC);  Surgeon: Tonny Branch, MD;  Location: [facility];  Service: Ophthalmology;  Laterality: Right;  CDE: 11.78  . CATARACT EXTRACTION W/PHACO Left 10/31/2015   Procedure: CATARACT EXTRACTION PHACO AND INTRAOCULAR LENS PLACEMENT LEFT EYE; CDE:  9.10;  Surgeon: Tonny Branch, MD;  Location: [facility];  Service: Ophthalmology;  Laterality: Left;  . COLONOSCOPY  08/19/2011   Procedure: COLONOSCOPY;  Surgeon:  Rogene Houston, MD;  [facility];  Service: Endoscopy;  Laterality: N/A;  930  . COLONOSCOPY N/A 09/19/2014   Procedure: COLONOSCOPY;  Surgeon: Rogene Houston, MD;  [facility];  Service: Endoscopy;  Laterality: N/A;  1055  . FEMORAL HERNIA REPAIR    . INCISION AND DRAINAGE Right 09/09/2016   Procedure: DEBRIDEMENT WOUND RT heel with wound vac attachment;  Surgeon: Caprice Beaver, DPM;  Location: [facility];  Service: Podiatry;  Laterality: Right;  . INCISION AND DRAINAGE ABSCESS Right 02/16/2018   Procedure: INCISION AND DRAINAGE ABSCESS;  Surgeon: Leta Baptist, MD;  Location: [place];  Service: ENT;  Laterality: Right;  . INCISION AND DRAINAGE OF WOUND Right 08/12/2016   Procedure: DEBRIDEMENT WOUND RIGHT HEEL;  Surgeon: Caprice Beaver, DPM;  Location: [facility];  Service: Podiatry;  Laterality: Right;  right heel  . MASS EXCISION Right 09/28/2017   Procedure: EXCISION OF EXTERNAL AUDITORY CANAL MASS;  Surgeon: Leta Baptist, MD;  Location: [place];  Service: ENT;  Laterality: Right;  . MASTOIDECTOMY Right 02/16/2018   Procedure: REVISION MASTOIDECTOMY;  Surgeon: Leta Baptist, MD;  Location: [facility] OR;  Service: ENT;  Laterality: Right;  . Open repair left quadriceps tendon.  08/22/07   Dr. Aline Brochure  . Right total knee replacement    . TRANSURETHRAL RESECTION OF PROSTATE    . TYMPANOMASTOIDECTOMY Right 11/10/2017   Procedure: RIGHT TYMPANOMASTOIDECTOMY;  Surgeon: Leta Baptist, MD;  Location: [place];  Service: ENT;  Laterality: Right;  . WOUND EXPLORATION Right 08/12/2016   Procedure: EXPLORATION OF WOUND FOR FOREIGN BODY RIGHT HEEL;  Surgeon: Caprice Beaver, DPM;  Location: [facility];  Service: Podiatry;  Laterality: Right;  right heel    Allergies  Allergen Reactions  . Erythromycin Other (See Comments)  . Penicillins Hives and Other (See Comments)    Has patient had a PCN reaction causing immediate rash, facial/tongue/throat swelling, SOB or  lightheadedness with hypotension: No Has patient had a PCN reaction causing severe rash involving mucus membranes or skin necrosis: No Has patient had a PCN reaction that required hospitalization No Has patient had a PCN reaction occurring within the last 10 years: No If all of the above answers are "NO", then may proceed with Cephalosporin use.  Has tolerated Rocephin    Outpatient Encounter Medications as of 03/22/2018  Medication Sig  . Ascorbic Acid (VITAMIN C) 1000 MG tablet Take 1,000 mg by mouth daily with breakfast.   . atorvastatin (LIPITOR) 10 MG tablet Take 10 mg by mouth every evening.  . bisacodyl (DULCOLAX) 10 MG suppository Place 5 mg rectally daily as needed for moderate constipation.  . bisacodyl (DULCOLAX) 5 MG EC tablet Take 1 tablet (5 mg total) by mouth daily as needed for moderate constipation.  . Calcium Carb-Cholecalciferol (CALCIUM 600+D) 600-800 MG-UNIT TABS Take 2 tablets by mouth daily with breakfast.  . cholecalciferol (VITAMIN D) 1000 units tablet Take 1,000 Units by mouth every morning.  . ciclopirox (LOPROX) 0.77 % cream Apply 1 application topically every morning. APPLY TO  THE FEET  . Coenzyme Q10 (CO Q-10) 100 MG CAPS Take 100 mg by mouth daily with breakfast.   . digoxin (LANOXIN) 0.125 MG tablet Take 0.125 mg by mouth daily with breakfast.   . finasteride (PROSCAR) 5 MG tablet Take 5 mg by mouth every morning.   . furosemide (LASIX) 20 MG tablet Take 20-40 mg by mouth See admin instructions. Take 20 mg by mouth on Sun, Tues, Thurs, Sat, and 40 mg on Mon, Wed, Fri.  . insulin aspart (NOVOLOG FLEXPEN) 100 UNIT/ML FlexPen Inject 0-4 Units into the skin 3 (three) times daily with meals. CBG 121 - 150: 1 unit,  CBG 151 - 200: 2 units,  CBG 201 - 250: 3 units,  CBG 251 - 300: 5 units,  CBG 301 - 350: 7 units,  CBG 351 - 400: 9 units  . Insulin Detemir (LEVEMIR FLEXTOUCH) 100 UNIT/ML Pen Inject 20 Units into the skin daily.  Marland Kitchen lisinopril (PRINIVIL,ZESTRIL) 20 MG  tablet Take 40 mg by mouth daily with supper.  Marland Kitchen LORazepam (ATIVAN) 1 MG tablet Take 1 tablet (1 mg total) by mouth at bedtime as needed for sleep.  . meropenem 2 g in sodium chloride 0.9 % 100 mL Inject 2 g into the vein every 8 (eight) hours.  . Multiple Vitamin (MULTIVITAMIN WITH MINERALS) TABS tablet Take 1 tablet by mouth daily with breakfast.  . Omega-3 Fatty Acids (FISH OIL) 1200 MG CAPS Take 2 capsules by mouth 2 (two) times daily.  . pioglitazone (ACTOS) 15 MG tablet Take 7.5 mg by mouth every evening.   . polyethylene glycol (MIRALAX / GLYCOLAX) packet Take 17 g by mouth daily.  . potassium chloride SA (K-DUR,KLOR-CON) 20 MEQ tablet Take 40 mEq by mouth daily. Take starting 01/07/2018   . traMADol (ULTRAM) 50 MG tablet Take 1 tablet (50 mg total) by mouth every 6 (six) hours as needed for moderate pain.  Marland Kitchen VICTOZA 18 MG/3ML SOPN Inject 1.8 mg into the skin daily with breakfast.  . vitamin E 400 UNIT capsule Take 400 Units by mouth every morning.  . warfarin (COUMADIN) 5 MG tablet Take 5 mg by mouth. Give once a day on Mon. Tues., Thurs., Fri., Sat., Sun.  . warfarin (COUMADIN) 5 MG tablet Take 5.5 mg by mouth daily. Every Wed.  . [DISCONTINUED] feeding supplement, ENSURE ENLIVE, (ENSURE ENLIVE) LIQD Take 237 mLs by mouth 2 (two) times daily between meals.  . [DISCONTINUED] warfarin (COUMADIN) 5 MG tablet Take 1 tablet daily or as directed   No facility-administered encounter medications on file as of 03/22/2018.      Review of Systems  Constitutional: Negative.   HENT: Negative.   Respiratory: Negative.  Negative for shortness of breath.   Cardiovascular: Negative.   Gastrointestinal: Negative.   Genitourinary: Negative.   Musculoskeletal: Negative.   Skin: Negative.   Neurological: Negative.   Psychiatric/Behavioral: Negative.      There is no immunization history on file for this patient. Pertinent  Health Maintenance Due  Topic Date Due  . FOOT EXAM  03/24/2018  (Originally 10/14/1944)  . OPHTHALMOLOGY EXAM  03/24/2018 (Originally 10/14/1944)  . URINE MICROALBUMIN  04/22/2018 (Originally 10/14/1944)  . HEMOGLOBIN A1C  06/14/2018  . PNA vac Low Risk Adult (2 of 2 - PPSV23) 01/25/2019  . INFLUENZA VACCINE  Completed   Fall Risk  03/21/2018  Number falls in past yr: 0  Injury with Fall? 0   Functional Status Survey:    Vitals:   03/22/18 1004  BP: 114/60  Pulse: 77  Resp: 18  Temp: 98.2 F (36.8 C)  TempSrc: Oral  SpO2: 98%   There is no height or weight on file to calculate BMI. Physical Exam Constitutional:      Appearance: He is well-developed.  HENT:     Head: Normocephalic.  Eyes:     Pupils: Pupils are equal, round, and reactive to light.  Neck:     Musculoskeletal: Neck supple.  Cardiovascular:     Rate and Rhythm: Normal rate. Rhythm irregular.  Pulmonary:     Effort: Pulmonary effort is normal. No respiratory distress.     Breath sounds: Normal breath sounds. No stridor. No wheezing.  Abdominal:     General: Bowel sounds are normal. There is no distension.     Palpations: Abdomen is soft.     Tenderness: There is no abdominal tenderness. There is no guarding.  Musculoskeletal:     Comments: Moderate edema Bilateral with Chronic Venous changes  Skin:    General: Skin is warm and dry.  Neurological:     Mental Status: He is alert and oriented to person, place, and time.     Comments: Walking with Gilford Rile . Right sided facial Droop due to Bells Palsy.  Psychiatric:        Behavior: Behavior normal.        Thought Content: Thought content normal.     Labs reviewed: Recent Labs    02/17/18 0434 02/18/18 0416 02/20/18 0604  NA 135 135 138  K 4.2 3.8 3.5  CL 98 97* 101  CO2 24 26 30   GLUCOSE 168* 236* 205*  BUN 21 14 13   CREATININE 0.93 0.98 0.75  CALCIUM 9.2 9.4 9.0   Recent Labs    06/29/17 1400 11/04/17 2028 12/14/17 0527  AST 32 18 19  ALT 47 20 17  ALKPHOS 97 86 98  BILITOT 1.2 1.3* 0.8  PROT  6.1* 6.1* 6.2*  ALBUMIN 3.3* 3.5 3.1*   Recent Labs    12/30/17 0700 01/11/18 0555  02/15/18 1449  02/19/18 0454 02/20/18 0604 02/21/18 0528  WBC 13.1* 14.6*   < > 20.5*   < > 16.7* 14.9* 16.6*  NEUTROABS 4.8 4.8  --  8.4*  --   --   --   --   HGB 12.1* 13.2   < > 12.7*   < > 11.2* 10.5* 10.5*  HCT 36.4* 40.2   < > 40.1   < > 34.9* 34.5* 33.9*  MCV 89.7 89.3   < > 86.8   < > 86.0 86.7 86.5  PLT 281 304   < > 358   < > 264 269 257   < > = values in this interval not displayed.   No results found for: TSH Lab Results  Component Value Date   HGBA1C 9.5 (H) 12/14/2017   Lab Results  Component Value Date   CHOL 124 10/06/2016   HDL 23 (L) 10/06/2016   LDLCALC 71 10/06/2016   TRIG 150 (H) 10/06/2016   CHOLHDL 5.4 10/06/2016    Significant Diagnostic Results in last 30 days:  Mr Jeri Cos Wo Contrast  Result Date: 03/18/2018 CLINICAL DATA:  83 year old male with history of mastoid inflammation and skull base osteomyelitis this year, right 6th nerve palsy last month with associated abnormal petrous apex, right transverse and sigmoid sinus thrombus. Subsequent encounter. EXAM: MRI HEAD WITHOUT AND WITH CONTRAST TECHNIQUE: Multiplanar, multiecho pulse sequences of the brain and surrounding structures were obtained  without and with intravenous contrast. CONTRAST:  10 milliliters Gadavist COMPARISON:  Brain MRI and intracranial MRA 02/15/2018 and earlier. FINDINGS: Brain: No restricted diffusion to suggest acute infarction. No midline shift, mass effect, evidence of mass lesion, ventriculomegaly, extra-axial collection or acute intracranial hemorrhage. Cervicomedullary junction and pituitary are within normal limits. Pearline Cables and white matter signal is stable throughout the brain. No brain parenchymal edema or abnormal parenchymal enhancement. Persistent abnormal diffusion and enhancement at the right temporal bone with associated abnormal dural thickening along the right dorsal clivus and  anterior right IAC. Confluent abnormal enhancement of the right petrous apex surrounding the right ICA siphon (series 15, image 19), right jugular foramen, and the right hypoglossal nerve course. Persistent abnormal right mastoid fluid and lateral right mastoid enhancement (series 15, image 26). There is abnormal marrow signal in the clivus and right ventral foramen magnum. No abnormal enhancement of the right cochlea, vestibule or right 7/8 cranial nerves. No improvement is evident since November. No new abnormal enhancement identified. Vascular: The lateral right transverse and the right sigmoid sinus flow voids remain abnormal since November compatible with thrombosis. The right IJ bulb patency is unclear. The other major intracranial vascular flow voids are preserved, although there is persistent abnormal T2 hyperintensity about the vertical petrous right ICA segment (series 8, image 6), which was not present this summer. Skull and upper cervical spine: Persistent abnormal right skull base as detailed above. Negative visible cervical spine. Left skull base bone marrow signal remains normal. Sinuses/Orbits: Orbits and paranasal sinuses are stable. Other: The left mastoids and middle ear structures remain normal. IMPRESSION: 1. 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 - right ICA petrous segment is surrounded by the abnormality and there may be associated Vasculitis. - occlusion of the right sigmoid sinus, distal transverse sinus, and probably the right IJ. - abnormal dural thickening along the right posterior fossa, but no cerebral edema or encephalitis at this time. 2. No new intracranial abnormality. Electronically Signed   By: Genevie Ann M.D.   On: 03/18/2018 20:08    Assessment/Plan Osteomyelitis of skull On Meropenem 2 g every 8 hours for a total of 4 weeks till 12/24. Patient has a follow-up appointment with  Dr Lorelee Cover He was seen by Infectious disease and they have recommended Cipro 750 mg BID for 3 months Follow up MRI did not show any worsening . Clinically patient is stable  Uncontrolled type 2 diabetes mellitus Patient on long-acting insulin, Actos, Victoza His blood sugars are running less then 150 mostly he is on strict sliding scale insulin with every meal Repeat HBA1C  Atrial fibrillation, chronic On chronic Coumadin Rate controlled on Digoxin We will follow PT/INR closely  Chronic diastolic CHF (congestive heart failure)  On Lasix Weight has been stable at 230 lbs Though he does have some worsening of LE Edema but patient does not want increase dose of lasix right now..  Hyperlipidemia,  On atorvastatin Essential Hypertension Stable on Lisinopril and Lasix     Family/ staff Communication:   Labs/tests ordered:

## 2018-03-24 ENCOUNTER — Encounter: Payer: Self-pay | Admitting: Internal Medicine

## 2018-03-24 NOTE — Progress Notes (Signed)
Location:    [facility] Room Number: 158/P Place of Service:  SNF [phone]) Provider: Alfonse Alpers, MD  Patient Care Team: [name], MD as PCP - General (Internal Medicine)  Extended Emergency Contact Information Primary Emergency Contact: Marschner,Norma Address: [address]          [place], [place] [address] Johnnette Litter of [place] Phone: [phone] Mobile Phone: [phone] Relation: Spouse  Code Status:  Full Code Goals of care: Advanced Directive information Advanced Directives 03/24/2018  Does Patient Have a Medical Advance Directive? Yes  Type of Advance Directive (No Data)  Does patient want to make changes to medical advance directive? No - Patient declined  Copy of [place] in Chart? No - copy requested  Would patient like information on creating a medical advance directive? -  Pre-existing out of facility DNR order (yellow form or pink MOST form) -     Chief Complaint  Patient presents with  . Medical Management of Chronic Issues    Routine visit of medical management    HPI:  Pt is a 83 y.o. male seen today for medical management of chronic diseases.     Past Medical History:  Diagnosis Date  . Atrial fibrillation ([place])   . Chronic lymphocytic leukemia ([place])   . Coronary atherosclerosis of native coronary artery    Nonobstructive  . Essential hypertension, benign   . Hyperlipidemia   . IgA nephropathy    Dr. Lowanda Foster  . Type 2 diabetes mellitus ([place])   . UTI (urinary tract infection) july  10 th   taking  med    Past Surgical History:  Procedure Laterality Date  . AMPUTATION Left 09/15/2012   Procedure: PARTIAL AMPUTATION 3rd TOE LEFT FOOT;  Surgeon: Marcheta Grammes, DPM;  Location: [facility];  Service: Orthopedics;  Laterality: Left;  . AMPUTATION Left 04/20/2013   Procedure: PARTIAL AMPUTATION 2ND TOE LEFT FOOT;  Surgeon: Marcheta Grammes, DPM;  Location: [facility];  Service:  Orthopedics;  Laterality: Left;  . ANAL FISSURE REPAIR    . APPLICATION OF WOUND VAC Right 09/09/2016   Procedure: APPLICATION OF WOUND VAC;  Surgeon: Caprice Beaver, DPM;  Location: [facility];  Service: Podiatry;  Laterality: Right;  . CATARACT EXTRACTION W/PHACO Right 09/30/2015   Procedure: CATARACT EXTRACTION PHACO AND INTRAOCULAR LENS PLACEMENT (IOC);  Surgeon: Tonny Branch, MD;  Location: [facility];  Service: Ophthalmology;  Laterality: Right;  CDE: 11.78  . CATARACT EXTRACTION W/PHACO Left 10/31/2015   Procedure: CATARACT EXTRACTION PHACO AND INTRAOCULAR LENS PLACEMENT LEFT EYE; CDE:  9.10;  Surgeon: Tonny Branch, MD;  Location: [facility];  Service: Ophthalmology;  Laterality: Left;  . COLONOSCOPY  08/19/2011   Procedure: COLONOSCOPY;  Surgeon: Rogene Houston, MD;  [facility];  Service: Endoscopy;  Laterality: N/A;  930  . COLONOSCOPY N/A 09/19/2014   Procedure: COLONOSCOPY;  Surgeon: Rogene Houston, MD;  [facility];  Service: Endoscopy;  Laterality: N/A;  1055  . FEMORAL HERNIA REPAIR    . INCISION AND DRAINAGE Right 09/09/2016   Procedure: DEBRIDEMENT WOUND RT heel with wound vac attachment;  Surgeon: Caprice Beaver, DPM;  Location: [facility];  Service: Podiatry;  Laterality: Right;  . INCISION AND DRAINAGE ABSCESS Right 02/16/2018   Procedure: INCISION AND DRAINAGE ABSCESS;  Surgeon: Leta Baptist, MD;  Location: [facility];  Service: ENT;  Laterality: Right;  . INCISION AND DRAINAGE OF WOUND Right 08/12/2016  Procedure: DEBRIDEMENT WOUND RIGHT HEEL;  Surgeon: Caprice Beaver, DPM;  Location: [facility];  Service: Podiatry;  Laterality: Right;  right heel  . MASS EXCISION Right 09/28/2017   Procedure: EXCISION OF EXTERNAL AUDITORY CANAL MASS;  Surgeon: Leta Baptist, MD;  Location: [facility];  Service: ENT;  Laterality: Right;  . MASTOIDECTOMY Right 02/16/2018   Procedure: REVISION MASTOIDECTOMY;  Surgeon: Leta Baptist, MD;  Location: [facility];  Service: ENT;  Laterality: Right;    . Open repair left quadriceps tendon.  08/22/07   Dr. Aline Brochure  . Right total knee replacement    . TRANSURETHRAL RESECTION OF PROSTATE    . TYMPANOMASTOIDECTOMY Right 11/10/2017   Procedure: RIGHT TYMPANOMASTOIDECTOMY;  Surgeon: Leta Baptist, MD;  Location: [facility];  Service: ENT;  Laterality: Right;  . WOUND EXPLORATION Right 08/12/2016   Procedure: EXPLORATION OF WOUND FOR FOREIGN BODY RIGHT HEEL;  Surgeon: Caprice Beaver, DPM;  Location: [facility];  Service: Podiatry;  Laterality: Right;  right heel    Allergies  Allergen Reactions  . Erythromycin Other (See Comments)  . Penicillins Hives and Other (See Comments)    Has patient had a PCN reaction causing immediate rash, facial/tongue/throat swelling, SOB or lightheadedness with hypotension: No Has patient had a PCN reaction causing severe rash involving mucus membranes or skin necrosis: No Has patient had a PCN reaction that required hospitalization No Has patient had a PCN reaction occurring within the last 10 years: No If all of the above answers are "NO", then may proceed with Cephalosporin use.  Has tolerated Rocephin    Outpatient Encounter Medications as of 03/24/2018  Medication Sig  . Ascorbic Acid (VITAMIN C) 1000 MG tablet Take 1,000 mg by mouth daily with breakfast.   . atorvastatin (LIPITOR) 10 MG tablet Take 10 mg by mouth every evening.  . bisacodyl (DULCOLAX) 10 MG suppository Place 5 mg rectally daily as needed for moderate constipation.  . bisacodyl (DULCOLAX) 5 MG EC tablet Take 1 tablet (5 mg total) by mouth daily as needed for moderate constipation.  . Calcium Carb-Cholecalciferol (CALCIUM 600+D) 600-800 MG-UNIT TABS Take 2 tablets by mouth daily with breakfast.  . cholecalciferol (VITAMIN D) 1000 units tablet Take 1,000 Units by mouth every morning.  . ciclopirox (LOPROX) 0.77 % cream Apply 1 application topically every morning. APPLY TO THE FEET  . Coenzyme Q10 (CO Q-10) 100 MG CAPS Take 100 mg by  mouth daily with breakfast.   . digoxin (LANOXIN) 0.125 MG tablet Take 0.125 mg by mouth daily with breakfast.   . finasteride (PROSCAR) 5 MG tablet Take 5 mg by mouth every morning.   . furosemide (LASIX) 20 MG tablet Take 20-40 mg by mouth See admin instructions. Take 20 mg by mouth on Sun, Tues, Thurs, Sat, and 40 mg on Mon, Wed, Fri.  . insulin aspart (NOVOLOG FLEXPEN) 100 UNIT/ML FlexPen Inject 0-4 Units into the skin 3 (three) times daily with meals. CBG 121 - 150: 1 unit,  CBG 151 - 200: 2 units,  CBG 201 - 250: 3 units,  CBG 251 - 300: 5 units,  CBG 301 - 350: 7 units,  CBG 351 - 400: 9 units  . Insulin Detemir (LEVEMIR FLEXTOUCH) 100 UNIT/ML Pen Inject 20 Units into the skin daily.  Marland Kitchen lisinopril (PRINIVIL,ZESTRIL) 20 MG tablet Take 40 mg by mouth daily with supper.  . meropenem 2 g in sodium chloride 0.9 % 100 mL Inject 2 g into the vein every 8 (eight) hours.  Marland Kitchen  Multiple Vitamin (MULTIVITAMIN WITH MINERALS) TABS tablet Take 1 tablet by mouth daily with breakfast.  . Omega-3 Fatty Acids (FISH OIL) 1200 MG CAPS Take 2 capsules by mouth 2 (two) times daily.  . pioglitazone (ACTOS) 15 MG tablet Take 7.5 mg by mouth every evening.   . polyethylene glycol (MIRALAX / GLYCOLAX) packet Take 17 g by mouth daily.  . potassium chloride SA (K-DUR,KLOR-CON) 20 MEQ tablet Take 40 mEq by mouth daily. Take starting 01/07/2018   . traMADol (ULTRAM) 50 MG tablet Take 1 tablet (50 mg total) by mouth every 6 (six) hours as needed for moderate pain.  Marland Kitchen VICTOZA 18 MG/3ML SOPN Inject 1.8 mg into the skin daily with breakfast.  . vitamin E 400 UNIT capsule Take 400 Units by mouth every morning.  . warfarin (COUMADIN) 5 MG tablet Take 5 mg by mouth. Give once a day on Mon. Tues., Thurs., Fri., Sat., Sun.  . warfarin (COUMADIN) 5 MG tablet Take 5.5 mg by mouth daily. Every Wed.  . [DISCONTINUED] LORazepam (ATIVAN) 1 MG tablet Take 1 tablet (1 mg total) by mouth at bedtime as needed for sleep.   No  facility-administered encounter medications on file as of 03/24/2018.      Review of Systems   There is no immunization history on file for this patient. Pertinent  Health Maintenance Due  Topic Date Due  . FOOT EXAM  03/24/2018 (Originally 10/14/1944)  . OPHTHALMOLOGY EXAM  03/24/2018 (Originally 10/14/1944)  . URINE MICROALBUMIN  04/22/2018 (Originally 10/14/1944)  . HEMOGLOBIN A1C  06/14/2018  . PNA vac Low Risk Adult (2 of 2 - PPSV23) 01/25/2019  . INFLUENZA VACCINE  Completed   Fall Risk  03/21/2018  Number falls in past yr: 0  Injury with Fall? 0   Functional Status Survey:    Vitals:   03/24/18 1134  BP: 127/70  Pulse: 60  Resp: 20  Temp: (!) 97.4 F (36.3 C)  TempSrc: Oral  SpO2: 97%  Weight: 234 lb 3.2 oz (106.2 kg)  Height: 6' (1.829 m)   Body mass index is 31.76 kg/m. Physical Exam  Labs reviewed: Recent Labs    02/17/18 0434 02/18/18 0416 02/20/18 0604  NA 135 135 138  K 4.2 3.8 3.5  CL 98 97* 101  CO2 24 26 30   GLUCOSE 168* 236* 205*  BUN 21 14 13   CREATININE 0.93 0.98 0.75  CALCIUM 9.2 9.4 9.0   Recent Labs    06/29/17 1400 11/04/17 2028 12/14/17 0527  AST 32 18 19  ALT 47 20 17  ALKPHOS 97 86 98  BILITOT 1.2 1.3* 0.8  PROT 6.1* 6.1* 6.2*  ALBUMIN 3.3* 3.5 3.1*   Recent Labs    12/30/17 0700 01/11/18 0555  02/15/18 1449  02/19/18 0454 02/20/18 0604 02/21/18 0528  WBC 13.1* 14.6*   < > 20.5*   < > 16.7* 14.9* 16.6*  NEUTROABS 4.8 4.8  --  8.4*  --   --   --   --   HGB 12.1* 13.2   < > 12.7*   < > 11.2* 10.5* 10.5*  HCT 36.4* 40.2   < > 40.1   < > 34.9* 34.5* 33.9*  MCV 89.7 89.3   < > 86.8   < > 86.0 86.7 86.5  PLT 281 304   < > 358   < > 264 269 257   < > = values in this interval not displayed.   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 Subsequent encounter. EXAM: MRI HEAD WITHOUT AND WITH CONTRAST TECHNIQUE: Multiplanar, multiecho pulse sequences of the brain and surrounding structures were obtained without and with intravenous contrast. CONTRAST:  10 milliliters Gadavist COMPARISON:  Brain MRI and intracranial MRA 02/15/2018 and earlier. FINDINGS: Brain: No restricted diffusion to suggest acute infarction. No midline shift, mass effect, evidence of mass lesion, ventriculomegaly, extra-axial collection or acute intracranial hemorrhage. Cervicomedullary junction and pituitary are within normal limits. [place] Cables and white matter signal is stable throughout the brain. No brain parenchymal edema or abnormal parenchymal enhancement. Persistent abnormal diffusion and enhancement at the right temporal bone with associated abnormal dural thickening along the right dorsal clivus and anterior right IAC. Confluent abnormal enhancement of the right petrous apex surrounding the right ICA siphon (series 15, image 19), right jugular foramen, and the right hypoglossal nerve course. Persistent abnormal right mastoid fluid and lateral right mastoid enhancement (series 15, image 26). There is abnormal marrow signal in the clivus and right ventral foramen magnum. No abnormal enhancement of the right cochlea, vestibule or right 7/8 cranial nerves. No improvement is evident since November. No new abnormal enhancement identified. Vascular: The lateral right transverse and the right sigmoid sinus flow voids remain abnormal since November compatible with thrombosis. The right IJ bulb patency is unclear. The other major  intracranial vascular flow voids are preserved, although there is persistent abnormal T2 hyperintensity about the vertical petrous right ICA segment (series 8, image 6), which was not present this summer. Skull and upper cervical spine: Persistent abnormal right skull base as detailed above. Negative visible cervical spine. Left skull base bone marrow signal remains normal. Sinuses/Orbits: Orbits and paranasal sinuses are stable. Other: The left mastoids and middle ear structures remain normal. IMPRESSION: 1. 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 - right ICA petrous segment is surrounded by the abnormality and there may be associated Vasculitis. - occlusion of the right sigmoid sinus, distal transverse sinus, and probably the right IJ. - abnormal dural thickening along the right posterior fossa, but no cerebral edema or encephalitis at this time. 2. No new intracranial abnormality. Electronically Signed   By: Genevie Ann M.D.   On: 03/18/2018 20:08    Assessment/Plan There are no diagnoses linked to this encounter.   Family/ staff Communication:   Labs/tests ordered:      This encounter was created in error - please disregard.

## 2018-03-25 ENCOUNTER — Encounter (HOSPITAL_COMMUNITY)
Admission: RE | Admit: 2018-03-25 | Discharge: 2018-03-25 | Disposition: A | Discharge status: Home / Self Care | Payer: Medicare Other | Source: Skilled Nursing Facility | Attending: Internal Medicine | Admitting: Internal Medicine

## 2018-03-25 DIAGNOSIS — M8618 Other acute osteomyelitis, other site: Secondary | ICD-10-CM | POA: Insufficient documentation

## 2018-03-25 DIAGNOSIS — Z48810 Encounter for surgical aftercare following surgery on the sense organs: Secondary | ICD-10-CM | POA: Insufficient documentation

## 2018-03-25 DIAGNOSIS — Z7901 Long term (current) use of anticoagulants: Secondary | ICD-10-CM | POA: Insufficient documentation

## 2018-03-26 ENCOUNTER — Encounter (HOSPITAL_COMMUNITY)
Admission: RE | Admit: 2018-03-26 | Discharge: 2018-03-26 | Disposition: A | Discharge status: Home / Self Care | Payer: Medicare Other | Source: Skilled Nursing Facility

## 2018-03-26 LAB — CBC WITH DIFFERENTIAL/PLATELET
ABS IMMATURE GRANULOCYTES: 0.01 10*3/uL (ref 0.00–0.07)
Basophils Absolute: 0.1 10*3/uL (ref 0.0–0.1)
Basophils Relative: 1 %
EOS ABS: 1.2 10*3/uL — AB (ref 0.0–0.5)
Eosinophils Relative: 11 %
HEMATOCRIT: 34.1 % — AB (ref 39.0–52.0)
Hemoglobin: 10.7 g/dL — ABNORMAL LOW (ref 13.0–17.0)
IMMATURE GRANULOCYTES: 0 %
LYMPHS ABS: 6.9 10*3/uL — AB (ref 0.7–4.0)
Lymphocytes Relative: 61 %
MCH: 28.3 pg (ref 26.0–34.0)
MCHC: 31.4 g/dL (ref 30.0–36.0)
MCV: 90.2 fL (ref 80.0–100.0)
MONOS PCT: 9 %
Monocytes Absolute: 1 10*3/uL (ref 0.1–1.0)
NEUTROS PCT: 18 %
Neutro Abs: 2 10*3/uL (ref 1.7–7.7)
Platelets: 153 10*3/uL (ref 150–400)
RBC: 3.78 MIL/uL — ABNORMAL LOW (ref 4.22–5.81)
RDW: 16 % — AB (ref 11.5–15.5)
WBC: 11.1 10*3/uL — ABNORMAL HIGH (ref 4.0–10.5)
nRBC: 0 % (ref 0.0–0.2)

## 2018-03-26 LAB — PROTIME-INR
INR: 1.62
Prothrombin Time: 19.1 seconds — ABNORMAL HIGH (ref 11.4–15.2)

## 2018-03-28 ENCOUNTER — Ambulatory Visit (INDEPENDENT_AMBULATORY_CARE_PROVIDER_SITE_OTHER): Payer: Medicare Other | Admitting: Otolaryngology

## 2018-03-29 ENCOUNTER — Encounter: Payer: Self-pay | Admitting: Internal Medicine

## 2018-03-29 ENCOUNTER — Non-Acute Institutional Stay (SKILLED_NURSING_FACILITY): Payer: Medicare Other | Admitting: Internal Medicine

## 2018-03-29 ENCOUNTER — Other Ambulatory Visit (HOSPITAL_COMMUNITY)
Admission: RE | Admit: 2018-03-29 | Discharge: 2018-03-29 | Disposition: A | Discharge status: Home / Self Care | Payer: Medicare Other | Source: Skilled Nursing Facility | Attending: Internal Medicine | Admitting: Internal Medicine

## 2018-03-29 DIAGNOSIS — I5032 Chronic diastolic (congestive) heart failure: Secondary | ICD-10-CM

## 2018-03-29 DIAGNOSIS — I482 Chronic atrial fibrillation, unspecified: Secondary | ICD-10-CM | POA: Diagnosis not present

## 2018-03-29 DIAGNOSIS — I10 Essential (primary) hypertension: Secondary | ICD-10-CM | POA: Diagnosis not present

## 2018-03-29 DIAGNOSIS — M869 Osteomyelitis, unspecified: Secondary | ICD-10-CM

## 2018-03-29 DIAGNOSIS — E1169 Type 2 diabetes mellitus with other specified complication: Secondary | ICD-10-CM

## 2018-03-29 DIAGNOSIS — Z7901 Long term (current) use of anticoagulants: Secondary | ICD-10-CM | POA: Insufficient documentation

## 2018-03-29 LAB — PROTIME-INR
INR: 1.4
PROTHROMBIN TIME: 17 s — AB (ref 11.4–15.2)

## 2018-03-29 NOTE — Progress Notes (Signed)
Location:    [facility] Room Number: 158/P Place of Service:  SNF [phone])  Provider: Veleta Miners MD  PCP: [name], MD Patient Care Team: [name], MD as PCP - General (Internal Medicine)  Extended Emergency Contact Information Primary Emergency Contact: Klemz,Norma Address: [address]          [place], [place] [address] Johnnette Litter of [place] Phone: [phone] Mobile Phone: [phone] Relation: Spouse  Code Status: Full Code Goals of care:  Advanced Directive information Advanced Directives 03/29/2018  Does Patient Have a Medical Advance Directive? Yes  Type of Advance Directive (No Data)  Does patient want to make changes to medical advance directive? No - Patient declined  Copy of [place] in Chart? No - copy requested  Would patient like information on creating a medical advance directive? -  Pre-existing out of facility DNR order (yellow form or pink MOST form) -     Allergies  Allergen Reactions  . Erythromycin Other (See Comments)  . Penicillins Hives and Other (See Comments)    Has patient had a PCN reaction causing immediate rash, facial/tongue/throat swelling, SOB or lightheadedness with hypotension: No Has patient had a PCN reaction causing severe rash involving mucus membranes or skin necrosis: No Has patient had a PCN reaction that required hospitalization No Has patient had a PCN reaction occurring within the last 10 years: No If all of the above answers are "NO", then may proceed with Cephalosporin use.  Has tolerated Rocephin    Chief Complaint  Patient presents with  . Discharge Note    Discharge Visit    HPI:  83 y.o. male  Seen today for Discharge  Patient stayed in the hospital from 11/12-11/18 For Skull Base Osteomyelitis.  Patient has a history of diabetes mellitus, diastolic CHF, hypertension, chronic atrial fibrillation on chronic Coumadin, HLD, CLL.  Patient has a history of chronic  right ear infection with Bell's palsy which later developed as abscess requiringTympanomastoidectomy on 8/07 followed by 3 antibiotics for6 Weeks forclinically diagnosed osteomyelitis of right mastoid and temporal bone.  But he continued to have discharge from his ear and then developedDiplopia due toright 6th nerve palsy. 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 He also has thrombus within the right sigmoid sinus and transverse sinus. He underwentRight revision mastoidectomy with drainage of abscess.on 11/13 His cultures grew pansensitive Pseudomonas and he is on meropenem for 4 weeks. His last dose is today. He will be discharged home in 2 days He just had repeat MRI done which did not show much change but no worsening of his infection. Infectious disease has recommended PO Cipro after IV is done . Patient also had gained some weight and his Lasix was increased to 40 mg QD. Patient denies any chest pain or cough or SOB. He is walking with the walker.   Past Medical History:  Diagnosis Date  . Atrial fibrillation ([place])   . Chronic lymphocytic leukemia ([place])   . Coronary atherosclerosis of native coronary artery    Nonobstructive  . Essential hypertension, benign   . Hyperlipidemia   . IgA nephropathy    Dr. Lowanda Foster  . Type 2 diabetes mellitus ([place])   . UTI (urinary tract infection) july  10 th   taking  med     Past Surgical History:  Procedure Laterality Date  . AMPUTATION Left 09/15/2012   Procedure: PARTIAL AMPUTATION 3rd TOE LEFT  FOOT;  Surgeon: Marcheta Grammes, DPM;  Location: [facility];  Service: Orthopedics;  Laterality: Left;  . AMPUTATION Left 04/20/2013   Procedure: PARTIAL AMPUTATION 2ND TOE LEFT FOOT;  Surgeon: Marcheta Grammes, DPM;  Location: [facility];  Service: Orthopedics;  Laterality: Left;  . ANAL FISSURE REPAIR    . APPLICATION OF WOUND VAC Right 09/09/2016   Procedure: APPLICATION OF  WOUND VAC;  Surgeon: Caprice Beaver, DPM;  Location: [facility];  Service: Podiatry;  Laterality: Right;  . CATARACT EXTRACTION W/PHACO Right 09/30/2015   Procedure: CATARACT EXTRACTION PHACO AND INTRAOCULAR LENS PLACEMENT (IOC);  Surgeon: Tonny Branch, MD;  Location: [facility];  Service: Ophthalmology;  Laterality: Right;  CDE: 11.78  . CATARACT EXTRACTION W/PHACO Left 10/31/2015   Procedure: CATARACT EXTRACTION PHACO AND INTRAOCULAR LENS PLACEMENT LEFT EYE; CDE:  9.10;  Surgeon: Tonny Branch, MD;  Location: [facility];  Service: Ophthalmology;  Laterality: Left;  . COLONOSCOPY  08/19/2011   Procedure: COLONOSCOPY;  Surgeon: Rogene Houston, MD;  [facility];  Service: Endoscopy;  Laterality: N/A;  930  . COLONOSCOPY N/A 09/19/2014   Procedure: COLONOSCOPY;  Surgeon: Rogene Houston, MD;  [facility];  Service: Endoscopy;  Laterality: N/A;  1055  . FEMORAL HERNIA REPAIR    . INCISION AND DRAINAGE Right 09/09/2016   Procedure: DEBRIDEMENT WOUND RT heel with wound vac attachment;  Surgeon: Caprice Beaver, DPM;  Location: [facility];  Service: Podiatry;  Laterality: Right;  . INCISION AND DRAINAGE ABSCESS Right 02/16/2018   Procedure: INCISION AND DRAINAGE ABSCESS;  Surgeon: Leta Baptist, MD;  Location: [facility];  Service: ENT;  Laterality: Right;  . INCISION AND DRAINAGE OF WOUND Right 08/12/2016   Procedure: DEBRIDEMENT WOUND RIGHT HEEL;  Surgeon: Caprice Beaver, DPM;  Location: [facility];  Service: Podiatry;  Laterality: Right;  right heel  . MASS EXCISION Right 09/28/2017   Procedure: EXCISION OF EXTERNAL AUDITORY CANAL MASS;  Surgeon: Leta Baptist, MD;  Location: [facility];  Service: ENT;  Laterality: Right;  . MASTOIDECTOMY Right 02/16/2018   Procedure: REVISION MASTOIDECTOMY;  Surgeon: Leta Baptist, MD;  [facility];  Service: ENT;  Laterality: Right;  . Open repair left quadriceps tendon.  08/22/07   Dr. Aline Brochure  . Right total knee replacement    . TRANSURETHRAL RESECTION OF  PROSTATE    . TYMPANOMASTOIDECTOMY Right 11/10/2017   Procedure: RIGHT TYMPANOMASTOIDECTOMY;  Surgeon: Leta Baptist, MD;  Location: [facility];  Service: ENT;  Laterality: Right;  . WOUND EXPLORATION Right 08/12/2016   Procedure: EXPLORATION OF WOUND FOR FOREIGN BODY RIGHT HEEL;  Surgeon: Caprice Beaver, DPM;  Location: [facility];  Service: Podiatry;  Laterality: Right;  right heel      reports that he quit smoking about 55 years ago. His smoking use included cigarettes. He quit after 10.00 years of use. He has never used smokeless tobacco. He reports that he does not drink alcohol or use drugs. Social History   Socioeconomic History  . Marital status: Married    Spouse name: Not on file  . Number of children: Not on file  . Years of education: Not on file  . Highest education level: Not on file  Occupational History  . Occupation: Retired    Fish farm manager: RETIRED    Comment: [employer]  . Financial resource strain: Not on file  . Food insecurity:    Worry: Not on file    Inability: Not on file  .  Transportation needs:    Medical: Not on file    Non-medical: Not on file  Tobacco Use  . Smoking status: Former Smoker    Years: 10.00    Types: Cigarettes    Last attempt to quit: 04/07/1963    Years since quitting: 55.0  . Smokeless tobacco: Never Used  Substance and Sexual Activity  . Alcohol use: No    Alcohol/week: 0.0 standard drinks  . Drug use: No  . Sexual activity: Not on file  Lifestyle  . Physical activity:    Days per week: Not on file    Minutes per session: Not on file  . Stress: Not on file  Relationships  . Social connections:    Talks on phone: Not on file    Gets together: Not on file    Attends religious service: Not on file    Active member of club or organization: Not on file    Attends meetings of clubs or organizations: Not on file    Relationship status: Not on file  . Intimate partner violence:    Fear of current or ex partner:  Not on file    Emotionally abused: Not on file    Physically abused: Not on file    Forced sexual activity: Not on file  Other Topics Concern  . Not on file  Social History Narrative  . Not on file   Functional Status Survey:    Allergies  Allergen Reactions  . Erythromycin Other (See Comments)  . Penicillins Hives and Other (See Comments)    Has patient had a PCN reaction causing immediate rash, facial/tongue/throat swelling, SOB or lightheadedness with hypotension: No Has patient had a PCN reaction causing severe rash involving mucus membranes or skin necrosis: No Has patient had a PCN reaction that required hospitalization No Has patient had a PCN reaction occurring within the last 10 years: No If all of the above answers are "NO", then may proceed with Cephalosporin use.  Has tolerated Rocephin    Pertinent  Health Maintenance Due  Topic Date Due  . URINE MICROALBUMIN  04/22/2018 (Originally 10/14/1944)  . FOOT EXAM  04/29/2018 (Originally 10/14/1944)  . OPHTHALMOLOGY EXAM  04/29/2018 (Originally 10/14/1944)  . HEMOGLOBIN A1C  06/14/2018  . PNA vac Low Risk Adult (2 of 2 - PPSV23) 01/25/2019  . INFLUENZA VACCINE  Completed    Medications: Outpatient Encounter Medications as of 03/29/2018  Medication Sig  . Ascorbic Acid (VITAMIN C) 1000 MG tablet Take 1,000 mg by mouth daily with breakfast.   . atorvastatin (LIPITOR) 10 MG tablet Take 10 mg by mouth every evening.  . bisacodyl (DULCOLAX) 10 MG suppository Place 5 mg rectally daily as needed for moderate constipation.  . bisacodyl (DULCOLAX) 5 MG EC tablet Take 1 tablet (5 mg total) by mouth daily as needed for moderate constipation.  . Calcium Carb-Cholecalciferol (CALCIUM 600+D) 600-800 MG-UNIT TABS Take 2 tablets by mouth daily with breakfast.  . cholecalciferol (VITAMIN D) 1000 units tablet Take 1,000 Units by mouth every morning.  . ciclopirox (LOPROX) 0.77 % cream Apply 1 application topically every morning. APPLY TO  THE FEET  . Coenzyme Q10 (CO Q-10) 100 MG CAPS Take 100 mg by mouth daily with breakfast.   . digoxin (LANOXIN) 0.125 MG tablet Take 0.125 mg by mouth daily with breakfast.   . finasteride (PROSCAR) 5 MG tablet Take 5 mg by mouth every morning.   . furosemide (LASIX) 20 MG tablet Take 20-40 mg by mouth See  admin instructions. Take 20 mg by mouth on Sun, Tues, Thurs, Sat, and 40 mg on Mon, Wed, Fri.  . insulin aspart (NOVOLOG FLEXPEN) 100 UNIT/ML FlexPen Inject 0-4 Units into the skin 3 (three) times daily with meals. CBG 121 - 150: 1 unit,  CBG 151 - 200: 2 units,  CBG 201 - 250: 3 units,  CBG 251 - 300: 5 units,  CBG 301 - 350: 7 units,  CBG 351 - 400: 9 units  . Insulin Detemir (LEVEMIR FLEXTOUCH) 100 UNIT/ML Pen Inject 20 Units into the skin daily.  Marland Kitchen lisinopril (PRINIVIL,ZESTRIL) 20 MG tablet Take 40 mg by mouth daily with supper.  . meropenem 2 g in sodium chloride 0.9 % 100 mL Inject 2 g into the vein every 8 (eight) hours.  . Multiple Vitamin (MULTIVITAMIN WITH MINERALS) TABS tablet Take 1 tablet by mouth daily with breakfast.  . Omega-3 Fatty Acids (FISH OIL) 1200 MG CAPS Take 2 capsules by mouth 2 (two) times daily.  . pioglitazone (ACTOS) 15 MG tablet Take 7.5 mg by mouth every evening.   . polyethylene glycol (MIRALAX / GLYCOLAX) packet Take 17 g by mouth daily.  . potassium chloride SA (K-DUR,KLOR-CON) 20 MEQ tablet Take 40 mEq by mouth daily. Take starting 01/07/2018   . traMADol (ULTRAM) 50 MG tablet Take 1 tablet (50 mg total) by mouth every 6 (six) hours as needed for moderate pain.  Marland Kitchen VICTOZA 18 MG/3ML SOPN Inject 1.8 mg into the skin daily with breakfast.  . vitamin E 400 UNIT capsule Take 400 Units by mouth every morning.  . warfarin (COUMADIN) 1 MG tablet Take 0.5 mg by mouth daily. Along with 5 mg to = 5.5 daily  . warfarin (COUMADIN) 5 MG tablet Take 5 mg by mouth daily. Along with 0.5 mg to = 5.5 mg daily  . [DISCONTINUED] warfarin (COUMADIN) 5 MG tablet Take 5 mg by mouth.  Give once a day on Mon. Tues., Thurs., Fri., Sat., Sun.   No facility-administered encounter medications on file as of 03/29/2018.      Review of Systems  Vitals:   03/29/18 1026  BP: 136/69  Pulse: 76  Resp: 18  Temp: 98.4 F (36.9 C)  TempSrc: Oral   There is no height or weight on file to calculate BMI. Physical Exam Constitutional:      Appearance: He is well-developed.  HENT:     Head: Normocephalic.  Eyes:     Pupils: Pupils are equal, round, and reactive to light.  Neck:     Musculoskeletal: Neck supple.  Cardiovascular:     Rate and Rhythm: Normal rate. Rhythm irregular.  Pulmonary:     Effort: Pulmonary effort is normal. No respiratory distress.     Breath sounds: Normal breath sounds. No stridor. No wheezing.  Abdominal:     General: Bowel sounds are normal. There is no distension.     Palpations: Abdomen is soft.     Tenderness: There is no abdominal tenderness. There is no guarding.  Musculoskeletal:     Comments: Moderate edema Bilateral with Chronic Venous changes  Skin:    General: Skin is warm and dry.  Neurological:     Mental Status: He is alert and oriented to person, place, and time.     Comments: Walking with Gilford Rile . Right sided facial Droop due to Bells Palsy.  Psychiatric:        Behavior: Behavior normal.        Thought Content: Thought content normal.  Labs reviewed: Basic Metabolic Panel: Recent Labs    02/17/18 0434 02/18/18 0416 02/20/18 0604  NA 135 135 138  K 4.2 3.8 3.5  CL 98 97* 101  CO2 24 26 30   GLUCOSE 168* 236* 205*  BUN 21 14 13   CREATININE 0.93 0.98 0.75  CALCIUM 9.2 9.4 9.0   Liver Function Tests: Recent Labs    06/29/17 1400 11/04/17 2028 12/14/17 0527  AST 32 18 19  ALT 47 20 17  ALKPHOS 97 86 98  BILITOT 1.2 1.3* 0.8  PROT 6.1* 6.1* 6.2*  ALBUMIN 3.3* 3.5 3.1*   No results for input(s): LIPASE, AMYLASE in the last 8760 hours. No results for input(s): AMMONIA in the last 8760  hours. CBC: Recent Labs    01/11/18 0555  02/15/18 1449  02/20/18 0604 02/21/18 0528 03/26/18 0940  WBC 14.6*   < > 20.5*   < > 14.9* 16.6* 11.1*  NEUTROABS 4.8  --  8.4*  --   --   --  2.0  HGB 13.2   < > 12.7*   < > 10.5* 10.5* 10.7*  HCT 40.2   < > 40.1   < > 34.5* 33.9* 34.1*  MCV 89.3   < > 86.8   < > 86.7 86.5 90.2  PLT 304   < > 358   < > 269 257 153   < > = values in this interval not displayed.   Cardiac Enzymes: Recent Labs    06/29/17 1400  TROPONINI 0.03*   BNP: Invalid input(s): POCBNP CBG: Recent Labs    02/20/18 2117 02/21/18 0618 02/21/18 1234  GLUCAP 243* 177* 249*    Procedures and Imaging Studies During Stay: Mr Jeri Cos Wo Contrast  Result Date: 03/18/2018 CLINICAL DATA:  83 year old male with history of mastoid inflammation and skull base osteomyelitis this year, right 6th nerve palsy last month with associated abnormal petrous apex, right transverse and sigmoid sinus thrombus. Subsequent encounter. EXAM: MRI HEAD WITHOUT AND WITH CONTRAST TECHNIQUE: Multiplanar, multiecho pulse sequences of the brain and surrounding structures were obtained without and with intravenous contrast. CONTRAST:  10 milliliters Gadavist COMPARISON:  Brain MRI and intracranial MRA 02/15/2018 and earlier. FINDINGS: Brain: No restricted diffusion to suggest acute infarction. No midline shift, mass effect, evidence of mass lesion, ventriculomegaly, extra-axial collection or acute intracranial hemorrhage. Cervicomedullary junction and pituitary are within normal limits. Pearline Cables and white matter signal is stable throughout the brain. No brain parenchymal edema or abnormal parenchymal enhancement. Persistent abnormal diffusion and enhancement at the right temporal bone with associated abnormal dural thickening along the right dorsal clivus and anterior right IAC. Confluent abnormal enhancement of the right petrous apex surrounding the right ICA siphon (series 15, image 19), right jugular  foramen, and the right hypoglossal nerve course. Persistent abnormal right mastoid fluid and lateral right mastoid enhancement (series 15, image 26). There is abnormal marrow signal in the clivus and right ventral foramen magnum. No abnormal enhancement of the right cochlea, vestibule or right 7/8 cranial nerves. No improvement is evident since November. No new abnormal enhancement identified. Vascular: The lateral right transverse and the right sigmoid sinus flow voids remain abnormal since November compatible with thrombosis. The right IJ bulb patency is unclear. The other major intracranial vascular flow voids are preserved, although there is persistent abnormal T2 hyperintensity about the vertical petrous right ICA segment (series 8, image 6), which was not present this summer. Skull and upper cervical spine: Persistent abnormal right skull base as  detailed above. Negative visible cervical spine. Left skull base bone marrow signal remains normal. Sinuses/Orbits: Orbits and paranasal sinuses are stable. Other: The left mastoids and middle ear structures remain normal. IMPRESSION: 1. 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 - right ICA petrous segment is surrounded by the abnormality and there may be associated Vasculitis. - occlusion of the right sigmoid sinus, distal transverse sinus, and probably the right IJ. - abnormal dural thickening along the right posterior fossa, but no cerebral edema or encephalitis at this time. 2. No new intracranial abnormality. Electronically Signed   By: Genevie Ann M.D.   On: 03/18/2018 20:08    Assessment/Plan:    Osteomyelitis of skull Received Meropenem 2 g every 8 hours for a total of 4 weeks . Patient has a follow-up appointment with Dr Lorelee Cover and ID He was seen by Infectious disease and they have recommended Cipro 750 mg BID for 3 months Follow up MRI did not show any worsening  . Clinically patient is stable  Uncontrolled type 2 diabetes mellitus Patient on long-acting insulin, Actos, Victoza His blood sugars are running less then 150 mostly he is on strict sliding scale insulin with every meal   Atrial fibrillation, chronic On chronic Coumadin Dose would be adjusted for his Cipro Rate controlledon Digoxin We will follow PT/INR closely  Chronic diastolic CHF (congestive heart failure) Patient had gained some weight in facility with worsening LE edema He finally agreed for me to increase his Lasix to 40 mg QD He will continue that at home His Wife will follow his weight at home. Follow up with his PCP Hyperlipidemia, On atorvastatin Essential Hypertension Stable on Lisinopril and Lasix     Future labs/tests needed:    Patient will have Home health with close follow up of his INR and BMP. He will follow with ID and ENT D/W the Wife in room Discharge planning of More then 35 min

## 2018-03-31 ENCOUNTER — Non-Acute Institutional Stay (SKILLED_NURSING_FACILITY): Payer: Medicare Other | Admitting: Internal Medicine

## 2018-03-31 ENCOUNTER — Encounter (HOSPITAL_COMMUNITY)
Admission: RE | Admit: 2018-03-31 | Discharge: 2018-03-31 | Disposition: A | Discharge status: Home / Self Care | Payer: Medicare Other | Source: Skilled Nursing Facility | Attending: Internal Medicine | Admitting: Internal Medicine

## 2018-03-31 ENCOUNTER — Encounter: Payer: Self-pay | Admitting: Internal Medicine

## 2018-03-31 DIAGNOSIS — I482 Chronic atrial fibrillation, unspecified: Secondary | ICD-10-CM | POA: Diagnosis not present

## 2018-03-31 DIAGNOSIS — M869 Osteomyelitis, unspecified: Secondary | ICD-10-CM

## 2018-03-31 DIAGNOSIS — R21 Rash and other nonspecific skin eruption: Secondary | ICD-10-CM

## 2018-03-31 DIAGNOSIS — Z7901 Long term (current) use of anticoagulants: Secondary | ICD-10-CM | POA: Insufficient documentation

## 2018-03-31 DIAGNOSIS — Z48810 Encounter for surgical aftercare following surgery on the sense organs: Secondary | ICD-10-CM | POA: Insufficient documentation

## 2018-03-31 DIAGNOSIS — E1169 Type 2 diabetes mellitus with other specified complication: Secondary | ICD-10-CM

## 2018-03-31 DIAGNOSIS — E119 Type 2 diabetes mellitus without complications: Secondary | ICD-10-CM | POA: Insufficient documentation

## 2018-03-31 DIAGNOSIS — M8618 Other acute osteomyelitis, other site: Secondary | ICD-10-CM | POA: Insufficient documentation

## 2018-03-31 DIAGNOSIS — I5032 Chronic diastolic (congestive) heart failure: Secondary | ICD-10-CM | POA: Diagnosis not present

## 2018-03-31 DIAGNOSIS — Z89422 Acquired absence of other left toe(s): Secondary | ICD-10-CM | POA: Insufficient documentation

## 2018-03-31 LAB — BASIC METABOLIC PANEL
Anion gap: 7 (ref 5–15)
BUN: 25 mg/dL — ABNORMAL HIGH (ref 8–23)
CO2: 28 mmol/L (ref 22–32)
Calcium: 9.6 mg/dL (ref 8.9–10.3)
Chloride: 104 mmol/L (ref 98–111)
Creatinine, Ser: 0.76 mg/dL (ref 0.61–1.24)
GFR calc Af Amer: >60 mL/min (ref >60)
Glucose, Bld: 116 mg/dL — ABNORMAL HIGH (ref 70–99)
Potassium: 3.5 mmol/L (ref 3.5–5.1)
Sodium: 139 mmol/L (ref 135–145)

## 2018-03-31 LAB — PROTIME-INR
INR: 1.47
Prothrombin Time: 17.7 seconds — ABNORMAL HIGH (ref 11.4–15.2)

## 2018-03-31 LAB — HEMOGLOBIN A1C
Hgb A1c MFr Bld: 6 % — ABNORMAL HIGH (ref 4.8–5.6)
Mean Plasma Glucose: 125.5 mg/dL

## 2018-03-31 NOTE — Progress Notes (Signed)
Location:    [facility] Room Number: 158/P Place of Service:  SNF [phone]) Provider: Veleta Miners MD  [name], MD  Patient Care Team: [name], MD as PCP - General (Internal Medicine)  Extended Emergency Contact Information Primary Emergency Contact: Greenhaw,Norma Address: [address]          [place], [place] [address] Johnnette Litter of [place] Phone: [phone] Mobile Phone: [phone] Relation: Spouse  Code Status:  Full Code Goals of care: Advanced Directive information Advanced Directives 03/31/2018  Does Patient Have a Medical Advance Directive? Yes  Type of Advance Directive (No Data)  Does patient want to make changes to medical advance directive? No - Patient declined  Copy of [place] in Chart? No - copy requested  Would patient like information on creating a medical advance directive? -  Pre-existing out of facility DNR order (yellow form or pink MOST form) -     Chief Complaint  Patient presents with  . Acute Visit    Rash on right hand    HPI:  Pt is a 83 y.o. male seen today for an acute visit for Rash in his Hand.  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  Patient has a history of diabetes mellitus, diastolic CHF, hypertension, chronic atrial fibrillation on chronic Coumadin, HLD, CLL.  Patient has a history of chronic right ear infection with Bell's palsy which later developed as abscess requiringTympanomastoidectomy on 8/07 followed by 3 antibiotics for6 Weeks forclinically diagnosed osteomyelitis of right mastoid and temporal bone.  But he continued to have discharge from his ear and then developedDiplopia due toright 6th nerve palsy. 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 He also has thrombus within the right sigmoid sinus and transverse sinus. He underwentRight revision mastoidectomy with drainage  of abscess.on 11/13 His cultures grew pansensitive Pseudomonas and he is on meropenem for 4 weeks. His Last dose was on 12/24 And then he was started on Cipro 750 mg   He got his first dose of Cipro yesterday. Today he noticed a rash on his Left Hand. With itching. He does not have rash in any other Part of his body. Patient is suppose to be going home today  Past Medical History:  Diagnosis Date  . Atrial fibrillation ([place])   . Chronic lymphocytic leukemia ([place])   . Coronary atherosclerosis of native coronary artery    Nonobstructive  . Essential hypertension, benign   . Hyperlipidemia   . IgA nephropathy    Dr. Lowanda Foster  . Type 2 diabetes mellitus ([place])   . UTI (urinary tract infection) july  10 th   taking  med    Past Surgical History:  Procedure Laterality Date  . AMPUTATION Left 09/15/2012   Procedure: PARTIAL AMPUTATION 3rd TOE LEFT FOOT;  Surgeon: Marcheta Grammes, DPM;  Location: [facility];  Service: Orthopedics;  Laterality: Left;  . AMPUTATION Left 04/20/2013   Procedure: PARTIAL AMPUTATION 2ND TOE LEFT FOOT;  Surgeon: Marcheta Grammes, DPM;  Location: [facility];  Service: Orthopedics;  Laterality: Left;  . ANAL FISSURE REPAIR    . APPLICATION OF WOUND VAC Right 09/09/2016   Procedure: APPLICATION OF WOUND VAC;  Surgeon: Caprice Beaver, DPM;  Location: [facility];  Service: Podiatry;  Laterality: Right;  . CATARACT EXTRACTION W/PHACO Right 09/30/2015   Procedure: CATARACT EXTRACTION PHACO AND INTRAOCULAR LENS PLACEMENT (IOC);  Surgeon: Tonny Branch,  MD;  Location: [facility];  Service: Ophthalmology;  Laterality: Right;  CDE: 11.78  . CATARACT EXTRACTION W/PHACO Left 10/31/2015   Procedure: CATARACT EXTRACTION PHACO AND INTRAOCULAR LENS PLACEMENT LEFT EYE; CDE:  9.10;  Surgeon: Tonny Branch, MD;  Location: [facility];  Service: Ophthalmology;  Laterality: Left;  . COLONOSCOPY  08/19/2011   Procedure: COLONOSCOPY;  Surgeon: Rogene Houston, MD;  [facility];  Service:  Endoscopy;  Laterality: N/A;  930  . COLONOSCOPY N/A 09/19/2014   Procedure: COLONOSCOPY;  Surgeon: Rogene Houston, MD;  [facility];  Service: Endoscopy;  Laterality: N/A;  1055  . FEMORAL HERNIA REPAIR    . INCISION AND DRAINAGE Right 09/09/2016   Procedure: DEBRIDEMENT WOUND RT heel with wound vac attachment;  Surgeon: Caprice Beaver, DPM;  Location: [facility];  Service: Podiatry;  Laterality: Right;  . INCISION AND DRAINAGE ABSCESS Right 02/16/2018   Procedure: INCISION AND DRAINAGE ABSCESS;  Surgeon: Leta Baptist, MD;  Location: [facility];  Service: ENT;  Laterality: Right;  . INCISION AND DRAINAGE OF WOUND Right 08/12/2016   Procedure: DEBRIDEMENT WOUND RIGHT HEEL;  Surgeon: Caprice Beaver, DPM;  Location: [facility];  Service: Podiatry;  Laterality: Right;  right heel  . MASS EXCISION Right 09/28/2017   Procedure: EXCISION OF EXTERNAL AUDITORY CANAL MASS;  Surgeon: Leta Baptist, MD;  Location: [facility];  Service: ENT;  Laterality: Right;  . MASTOIDECTOMY Right 02/16/2018   Procedure: REVISION MASTOIDECTOMY;  Surgeon: Leta Baptist, MD;  Location: [facility];  Service: ENT;  Laterality: Right;  . Open repair left quadriceps tendon.  08/22/07   Dr. Aline Brochure  . Right total knee replacement    . TRANSURETHRAL RESECTION OF PROSTATE    . TYMPANOMASTOIDECTOMY Right 11/10/2017   Procedure: RIGHT TYMPANOMASTOIDECTOMY;  Surgeon: Leta Baptist, MD;  Location: [facility];  Service: ENT;  Laterality: Right;  . WOUND EXPLORATION Right 08/12/2016   Procedure: EXPLORATION OF WOUND FOR FOREIGN BODY RIGHT HEEL;  Surgeon: Caprice Beaver, DPM;  Location: [facility];  Service: Podiatry;  Laterality: Right;  right heel    Allergies  Allergen Reactions  . Erythromycin Other (See Comments)  . Penicillins Hives and Other (See Comments)    Has patient had a PCN reaction causing immediate rash, facial/tongue/throat swelling, SOB or lightheadedness with hypotension: No Has patient had a PCN reaction  causing severe rash involving mucus membranes or skin necrosis: No Has patient had a PCN reaction that required hospitalization No Has patient had a PCN reaction occurring within the last 10 years: No If all of the above answers are "NO", then may proceed with Cephalosporin use.  Has tolerated Rocephin    Outpatient Encounter Medications as of 03/31/2018  Medication Sig  . Ascorbic Acid (VITAMIN C) 1000 MG tablet Take 1,000 mg by mouth daily with breakfast.   . atorvastatin (LIPITOR) 10 MG tablet Take 10 mg by mouth every evening.  . bisacodyl (DULCOLAX) 10 MG suppository Place 5 mg rectally daily as needed for moderate constipation.  . bisacodyl (DULCOLAX) 5 MG EC tablet Take 1 tablet (5 mg total) by mouth daily as needed for moderate constipation.  . Calcium Carb-Cholecalciferol (CALCIUM 600+D) 600-800 MG-UNIT TABS Take 2 tablets by mouth daily with breakfast.  . cholecalciferol (VITAMIN D) 1000 units tablet Take 1,000 Units by mouth every morning.  . ciclopirox (LOPROX) 0.77 % cream Apply 1 application topically every morning. APPLY TO THE FEET  . ciprofloxacin (CIPRO) 750 MG tablet Take 750  mg by mouth 2 (two) times daily.  . Coenzyme Q10 (CO Q-10) 100 MG CAPS Take 100 mg by mouth daily with breakfast.   . digoxin (LANOXIN) 0.125 MG tablet Take 0.125 mg by mouth daily with breakfast.   . finasteride (PROSCAR) 5 MG tablet Take 5 mg by mouth every morning.   . furosemide (LASIX) 20 MG tablet Take 40 mg by mouth daily.   . insulin aspart (NOVOLOG FLEXPEN) 100 UNIT/ML FlexPen Inject 0-4 Units into the skin 3 (three) times daily with meals. CBG 121 - 150: 1 unit,  CBG 151 - 200: 2 units,  CBG 201 - 250: 3 units,  CBG 251 - 300: 5 units,  CBG 301 - 350: 7 units,  CBG 351 - 400: 9 units  . Insulin Detemir (LEVEMIR FLEXTOUCH) 100 UNIT/ML Pen Inject 20 Units into the skin daily.  Marland Kitchen lisinopril (PRINIVIL,ZESTRIL) 20 MG tablet Take 40 mg by mouth daily with supper.  . Multiple Vitamin (MULTIVITAMIN  WITH MINERALS) TABS tablet Take 1 tablet by mouth daily with breakfast.  . Omega-3 Fatty Acids (FISH OIL) 1200 MG CAPS Take 2 capsules by mouth 2 (two) times daily.  . pioglitazone (ACTOS) 15 MG tablet Take 7.5 mg by mouth every evening.   . polyethylene glycol (MIRALAX / GLYCOLAX) packet Take 17 g by mouth daily.  . potassium chloride SA (K-DUR,KLOR-CON) 20 MEQ tablet Take 40 mEq by mouth daily. Take starting 01/07/2018   . traMADol (ULTRAM) 50 MG tablet Take 1 tablet (50 mg total) by mouth every 6 (six) hours as needed for moderate pain.  Marland Kitchen VICTOZA 18 MG/3ML SOPN Inject 1.8 mg into the skin daily with breakfast.  . vitamin E 400 UNIT capsule Take 400 Units by mouth every morning.  . warfarin (COUMADIN) 1 MG tablet Take 0.5 mg by mouth daily. Along with 5 mg to = 5.5 daily  . warfarin (COUMADIN) 5 MG tablet Take 5 mg by mouth daily. Along with 0.5 mg to = 5.5 mg daily  . [DISCONTINUED] meropenem 2 g in sodium chloride 0.9 % 100 mL Inject 2 g into the vein every 8 (eight) hours.   No facility-administered encounter medications on file as of 03/31/2018.      Review of Systems  Constitutional: Negative.   HENT: Negative.   Respiratory: Negative.   Cardiovascular: Positive for leg swelling.  Gastrointestinal: Negative.   Genitourinary: Negative.   Musculoskeletal: Negative.   Skin: Positive for rash.  Neurological: Negative.   Psychiatric/Behavioral: Negative.     Immunization History  Administered Date(s) Administered  . Influenza, High Dose Seasonal PF 01/10/2017  . Pneumococcal Polysaccharide-23 01/10/2017   Pertinent  Health Maintenance Due  Topic Date Due  . URINE MICROALBUMIN  04/22/2018 (Originally 10/14/1944)  . FOOT EXAM  04/29/2018 (Originally 10/14/1944)  . OPHTHALMOLOGY EXAM  04/29/2018 (Originally 10/14/1944)  . HEMOGLOBIN A1C  06/14/2018  . INFLUENZA VACCINE  Completed  . PNA vac Low Risk Adult  Completed   Fall Risk  03/21/2018  Number falls in past yr: 0  Injury  with Fall? 0   Functional Status Survey:    Vitals:   03/31/18 1710  BP: (!) 100/53  Pulse: 78  Resp: 20  Temp: (!) 97.1 F (36.2 C)   There is no height or weight on file to calculate BMI. Physical Exam Constitutional:      Appearance: He is well-developed.  HENT:     Head: Normocephalic.  Eyes:     Pupils: Pupils are equal, round,  and reactive to light.  Neck:     Musculoskeletal: Neck supple.  Cardiovascular:     Rate and Rhythm: Normal rate. Rhythm irregular.  Pulmonary:     Effort: Pulmonary effort is normal. No respiratory distress.     Breath sounds: Normal breath sounds. No stridor. No wheezing.  Abdominal:     General: Bowel sounds are normal. There is no distension.     Palpations: Abdomen is soft.     Tenderness: There is no abdominal tenderness. There is no guarding.  Musculoskeletal:     Comments: Moderate edema Bilateral with Chronic Venous changes  Skin:    General: Skin is warm and dry.     Comments: Patient has Rash in his Left Hand and Arm  Neurological:     Mental Status: He is alert and oriented to person, place, and time.     Comments: Walking with Gilford Rile . Right sided facial Droop due to Bells Palsy.  Psychiatric:        Behavior: Behavior normal.        Thought Content: Thought content normal.     Labs reviewed: Recent Labs    02/18/18 0416 02/20/18 0604 03/31/18 0745  NA 135 138 139  K 3.8 3.5 3.5  CL 97* 101 104  CO2 26 30 28   GLUCOSE 236* 205* 116*  BUN 14 13 25*  CREATININE 0.98 0.75 0.76  CALCIUM 9.4 9.0 9.6   Recent Labs    06/29/17 1400 11/04/17 2028 12/14/17 0527  AST 32 18 19  ALT 47 20 17  ALKPHOS 97 86 98  BILITOT 1.2 1.3* 0.8  PROT 6.1* 6.1* 6.2*  ALBUMIN 3.3* 3.5 3.1*   Recent Labs    01/11/18 0555  02/15/18 1449  02/20/18 0604 02/21/18 0528 03/26/18 0940  WBC 14.6*   < > 20.5*   < > 14.9* 16.6* 11.1*  NEUTROABS 4.8  --  8.4*  --   --   --  2.0  HGB 13.2   < > 12.7*   < > 10.5* 10.5* 10.7*  HCT 40.2    < > 40.1   < > 34.5* 33.9* 34.1*  MCV 89.3   < > 86.8   < > 86.7 86.5 90.2  PLT 304   < > 358   < > 269 257 153   < > = values in this interval not displayed.   No results found for: TSH Lab Results  Component Value Date   HGBA1C 6.0 (H) 03/31/2018   Lab Results  Component Value Date   CHOL 124 10/06/2016   HDL 23 (L) 10/06/2016   LDLCALC 71 10/06/2016   TRIG 150 (H) 10/06/2016   CHOLHDL 5.4 10/06/2016    Significant Diagnostic Results in last 30 days:  Mr Jeri Cos Wo Contrast  Result Date: 03/18/2018 CLINICAL DATA:  83 year old male with history of mastoid inflammation and skull base osteomyelitis this year, right 6th nerve palsy last month with associated abnormal petrous apex, right transverse and sigmoid sinus thrombus. Subsequent encounter. EXAM: MRI HEAD WITHOUT AND WITH CONTRAST TECHNIQUE: Multiplanar, multiecho pulse sequences of the brain and surrounding structures were obtained without and with intravenous contrast. CONTRAST:  10 milliliters Gadavist COMPARISON:  Brain MRI and intracranial MRA 02/15/2018 and earlier. FINDINGS: Brain: No restricted diffusion to suggest acute infarction. No midline shift, mass effect, evidence of mass lesion, ventriculomegaly, extra-axial collection or acute intracranial hemorrhage. Cervicomedullary junction and pituitary are within normal limits. Pearline Cables and white matter signal is stable  throughout the brain. No brain parenchymal edema or abnormal parenchymal enhancement. Persistent abnormal diffusion and enhancement at the right temporal bone with associated abnormal dural thickening along the right dorsal clivus and anterior right IAC. Confluent abnormal enhancement of the right petrous apex surrounding the right ICA siphon (series 15, image 19), right jugular foramen, and the right hypoglossal nerve course. Persistent abnormal right mastoid fluid and lateral right mastoid enhancement (series 15, image 26). There is abnormal marrow signal in the  clivus and right ventral foramen magnum. No abnormal enhancement of the right cochlea, vestibule or right 7/8 cranial nerves. No improvement is evident since November. No new abnormal enhancement identified. Vascular: The lateral right transverse and the right sigmoid sinus flow voids remain abnormal since November compatible with thrombosis. The right IJ bulb patency is unclear. The other major intracranial vascular flow voids are preserved, although there is persistent abnormal T2 hyperintensity about the vertical petrous right ICA segment (series 8, image 6), which was not present this summer. Skull and upper cervical spine: Persistent abnormal right skull base as detailed above. Negative visible cervical spine. Left skull base bone marrow signal remains normal. Sinuses/Orbits: Orbits and paranasal sinuses are stable. Other: The left mastoids and middle ear structures remain normal. IMPRESSION: 1. 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 - right ICA petrous segment is surrounded by the abnormality and there may be associated Vasculitis. - occlusion of the right sigmoid sinus, distal transverse sinus, and probably the right IJ. - abnormal dural thickening along the right posterior fossa, but no cerebral edema or encephalitis at this time. 2. No new intracranial abnormality. Electronically Signed   By: Genevie Ann M.D.   On: 03/18/2018 20:08    Assessment/Plan Rash Possible due to Cipro D/W Dr Linus Salmons and Patients Wife Patient needs to be on Cipro for 3 months This rash is very mild She agreed that we will continue him on Cipro. If rash gets worse he can try Benadryl low dose before taking Cipro.He can use OTC hydrocortisone If  Rash Gets worse to stop Cipro and  he will call ID office and inform them.  Osteomyelitis of skull Received Meropenem 2 g every 8 hours for a total of 4 weeks . Patient has a follow-up  appointment with DrTio and ID Per  Infectious disease he needs Cipro 750 mg BID for 3 months Follow up MRI did not show any worsening . Clinically patient is stable  Uncontrolled type 2 diabetes mellitus Patient on long-acting insulin, Actos, Victoza His blood sugars are runningless then 150 mostly he is on strict sliding scale insulin with every meal Repeat A1C was 6.  Atrial fibrillation, chronic On chronic Coumadin Dose Coumadin increased for Low INR Rate controlledon Digoxin We will follow PT/INRclosely. Patient follows with [facility]  Chronic diastolic CHF (congestive heart failure) Patient had gained some weight in facility with worsening LE edema He finally agreed for me to increase his Lasix to 40 mg QD He will continue that at home His Wife will follow his weight at home. Follow up with his PCP Hyperlipidemia, On atorvastatin Essential Hypertension Stable on Lisinopril and Lasix Future labs/tests needed:   Patient will have Home health with close follow up of his INR and BMP. He will follow with ID and ENT D/W the Wife in room  Family/ staff Communication:   Labs/tests ordered:

## 2018-04-04 ENCOUNTER — Ambulatory Visit (INDEPENDENT_AMBULATORY_CARE_PROVIDER_SITE_OTHER): Payer: Medicare Other | Admitting: *Deleted

## 2018-04-04 DIAGNOSIS — I4891 Unspecified atrial fibrillation: Secondary | ICD-10-CM | POA: Diagnosis not present

## 2018-04-04 DIAGNOSIS — Z7901 Long term (current) use of anticoagulants: Secondary | ICD-10-CM | POA: Diagnosis not present

## 2018-04-04 DIAGNOSIS — Z5181 Encounter for therapeutic drug level monitoring: Secondary | ICD-10-CM

## 2018-04-04 LAB — POCT INR: INR: 1.9 — AB (ref 2.0–3.0)

## 2018-04-04 NOTE — Patient Instructions (Signed)
D/C from SNF 12/26  Started Cipro 750mg  twice daily until he sees ID on 05/09/18 D/C on coumadin 5.5mg  daily (has a 5mg  tablet and a 1 mg tablet) Continue coumadin 5.5mg  daily Recheck in 1 week

## 2018-04-07 DIAGNOSIS — Z789 Other specified health status: Secondary | ICD-10-CM | POA: Diagnosis not present

## 2018-04-07 DIAGNOSIS — G510 Bell's palsy: Secondary | ICD-10-CM | POA: Diagnosis not present

## 2018-04-07 DIAGNOSIS — I4891 Unspecified atrial fibrillation: Secondary | ICD-10-CM | POA: Diagnosis not present

## 2018-04-07 DIAGNOSIS — E1165 Type 2 diabetes mellitus with hyperglycemia: Secondary | ICD-10-CM | POA: Diagnosis not present

## 2018-04-07 DIAGNOSIS — Z6835 Body mass index (BMI) 35.0-35.9, adult: Secondary | ICD-10-CM | POA: Diagnosis not present

## 2018-04-07 DIAGNOSIS — Z8739 Personal history of other diseases of the musculoskeletal system and connective tissue: Secondary | ICD-10-CM | POA: Diagnosis not present

## 2018-04-07 DIAGNOSIS — I10 Essential (primary) hypertension: Secondary | ICD-10-CM | POA: Diagnosis not present

## 2018-04-07 DIAGNOSIS — Z299 Encounter for prophylactic measures, unspecified: Secondary | ICD-10-CM | POA: Diagnosis not present

## 2018-04-11 ENCOUNTER — Ambulatory Visit (INDEPENDENT_AMBULATORY_CARE_PROVIDER_SITE_OTHER): Payer: Medicare Other | Admitting: Otolaryngology

## 2018-04-11 DIAGNOSIS — H903 Sensorineural hearing loss, bilateral: Secondary | ICD-10-CM | POA: Diagnosis not present

## 2018-04-14 ENCOUNTER — Ambulatory Visit (INDEPENDENT_AMBULATORY_CARE_PROVIDER_SITE_OTHER): Payer: Medicare Other | Admitting: Pharmacist

## 2018-04-14 DIAGNOSIS — I4891 Unspecified atrial fibrillation: Secondary | ICD-10-CM | POA: Diagnosis not present

## 2018-04-14 DIAGNOSIS — Z5181 Encounter for therapeutic drug level monitoring: Secondary | ICD-10-CM

## 2018-04-14 DIAGNOSIS — Z7901 Long term (current) use of anticoagulants: Secondary | ICD-10-CM | POA: Diagnosis not present

## 2018-04-14 LAB — POCT INR: INR: 2.6 (ref 2.0–3.0)

## 2018-04-14 NOTE — Patient Instructions (Signed)
Description   D/C from SNF 12/26  Started Cipro 750mg  twice daily until he sees ID on 05/09/18 D/C on coumadin 5.5mg  daily (has a 5mg  tablet and a 1 mg tablet) Continue coumadin 5.5mg  daily Recheck in 2 week

## 2018-04-15 DIAGNOSIS — B351 Tinea unguium: Secondary | ICD-10-CM | POA: Diagnosis not present

## 2018-04-15 DIAGNOSIS — E1142 Type 2 diabetes mellitus with diabetic polyneuropathy: Secondary | ICD-10-CM | POA: Diagnosis not present

## 2018-04-15 DIAGNOSIS — L851 Acquired keratosis [keratoderma] palmaris et plantaris: Secondary | ICD-10-CM | POA: Diagnosis not present

## 2018-04-20 ENCOUNTER — Encounter: Payer: Self-pay | Admitting: Cardiology

## 2018-04-20 ENCOUNTER — Ambulatory Visit (INDEPENDENT_AMBULATORY_CARE_PROVIDER_SITE_OTHER): Payer: Medicare Other | Admitting: Cardiology

## 2018-04-20 ENCOUNTER — Encounter

## 2018-04-20 VITALS — BP 92/55 | HR 81 | Ht 72.0 in | Wt 241.0 lb

## 2018-04-20 DIAGNOSIS — I2510 Atherosclerotic heart disease of native coronary artery without angina pectoris: Secondary | ICD-10-CM

## 2018-04-20 DIAGNOSIS — I10 Essential (primary) hypertension: Secondary | ICD-10-CM

## 2018-04-20 DIAGNOSIS — I4821 Permanent atrial fibrillation: Secondary | ICD-10-CM | POA: Diagnosis not present

## 2018-04-20 NOTE — Progress Notes (Signed)
Cardiology Office Note  Date: 04/20/2018   ID: [name], DOB 07-16-1934, MRN [id number]  PCP: [name], MD  Primary Cardiologist: [name], MD   Chief Complaint  Patient presents with  . Atrial Fibrillation    History of Present Illness: [name] [name] is an 84 y.o. male last seen in April 2019.  [name] [name] presents overdue for follow-up.  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  [name] [name] has had a tough last 6 months, in [name] hospital a fair bit and then nursing home, [name] [name] got back home at [name] end of December 2019.  [name] [name] tells me that [name] [name] actually feels pretty good at this point.  [name] [name] has lost a significant amount of weight through all this.  Still using Lasix for leg edema. [name] [name] has a walker, denies any falls.  [name] [name] does not report any palpitations.  [name] [name] remains on Coumadin with follow-up in [name] anticoagulation clinic.  [name] [name] denies any bleeding problems.  I reviewed his medications today which are outlined below.  Past Medical History:  Diagnosis Date  . Atrial fibrillation ([name] [name])   . Chronic lymphocytic leukemia ([name] [name])   . Coronary atherosclerosis of native coronary artery    Nonobstructive  . Essential hypertension   . Hyperlipidemia   . IgA nephropathy    Dr. Lowanda Foster  . Type 2 diabetes mellitus ([name] [name])   . UTI (urinary tract infection)     Past Surgical History:  Procedure Laterality Date  . AMPUTATION Left 09/15/2012   Procedure: PARTIAL AMPUTATION 3rd TOE LEFT FOOT;  Surgeon: Marcheta Grammes, DPM;  Location: [facility];  Service: Orthopedics;  Laterality: Left;  . AMPUTATION Left 04/20/2013   Procedure: PARTIAL AMPUTATION 2ND TOE LEFT FOOT;  Surgeon: Marcheta Grammes, DPM;  Location: [facility];  Service: Orthopedics;  Laterality: Left;  . ANAL FISSURE REPAIR    . APPLICATION OF WOUND VAC Right 09/09/2016   Procedure: APPLICATION OF WOUND VAC;  Surgeon: Caprice Beaver, DPM;  Location: [facility];  Service: Podiatry;  Laterality: Right;  . CATARACT EXTRACTION W/PHACO Right 09/30/2015   Procedure: CATARACT EXTRACTION PHACO AND INTRAOCULAR LENS PLACEMENT (IOC);  Surgeon: Tonny Branch, MD;  Location: [facility];  Service: Ophthalmology;  Laterality: Right;  CDE: 11.78  . CATARACT EXTRACTION W/PHACO Left 10/31/2015   Procedure: CATARACT EXTRACTION PHACO AND INTRAOCULAR LENS PLACEMENT LEFT EYE; CDE:  9.10;  Surgeon: Tonny Branch, MD;  Location: [facility];  Service: Ophthalmology;  Laterality: Left;  . COLONOSCOPY  08/19/2011   Procedure: COLONOSCOPY;  Surgeon: Rogene Houston, MD;  [facility];  Service: Endoscopy;  Laterality: N/A;  930  . COLONOSCOPY N/A 09/19/2014   Procedure: COLONOSCOPY;  Surgeon: Rogene Houston, MD;  [facility];  Service: Endoscopy;  Laterality: N/A;  1055  . FEMORAL HERNIA REPAIR    . INCISION AND DRAINAGE Right 09/09/2016   Procedure: DEBRIDEMENT WOUND RT heel with wound vac attachment;  Surgeon: Caprice Beaver, DPM;  Location: [facility];  Service: Podiatry;  Laterality: Right;  . INCISION AND DRAINAGE ABSCESS Right 02/16/2018   Procedure: INCISION AND DRAINAGE ABSCESS;  Surgeon: Leta Baptist, MD;  Location: [facility];  Service: ENT;  Laterality: Right;  . INCISION AND DRAINAGE OF WOUND Right 08/12/2016   Procedure: DEBRIDEMENT WOUND RIGHT HEEL;  Surgeon: Caprice Beaver, DPM;  Location: [facility];  Service: Podiatry;  Laterality: Right;  right heel  . MASS EXCISION Right  09/28/2017   Procedure: EXCISION OF EXTERNAL AUDITORY CANAL MASS;  Surgeon: Leta Baptist, MD;  Location: [facility];  Service: ENT;  Laterality: Right;  . MASTOIDECTOMY Right 02/16/2018   Procedure: REVISION MASTOIDECTOMY;  Surgeon: Leta Baptist, MD;  Location: [facility];  Service: ENT;  Laterality: Right;  . Open repair left quadriceps tendon.  08/22/07   Dr. Aline Brochure  . Right total knee replacement    . TRANSURETHRAL RESECTION OF PROSTATE    . TYMPANOMASTOIDECTOMY Right 11/10/2017    Procedure: RIGHT TYMPANOMASTOIDECTOMY;  Surgeon: Leta Baptist, MD;  Location: [place] [facility];  Service: ENT;  Laterality: Right;  . WOUND EXPLORATION Right 08/12/2016   Procedure: EXPLORATION OF WOUND FOR FOREIGN BODY RIGHT HEEL;  Surgeon: Caprice Beaver, DPM;  Location: [facility];  Service: Podiatry;  Laterality: Right;  right heel    Current Outpatient Medications  Medication Sig Dispense Refill  . Ascorbic Acid (VITAMIN C) 1000 MG tablet Take 1,000 mg by mouth daily with breakfast.     . atorvastatin (LIPITOR) 10 MG tablet Take 10 mg by mouth every evening.    . Calcium Carb-Cholecalciferol (CALCIUM 600+D) 600-800 MG-UNIT TABS Take 2 tablets by mouth daily with breakfast.    . cholecalciferol (VITAMIN D) 1000 units tablet Take 1,000 Units by mouth every morning.    . ciclopirox (LOPROX) 0.77 % cream Apply 1 application topically every morning. APPLY TO [name] FEET    . ciprofloxacin (CIPRO) 750 MG tablet Take 750 mg by mouth 2 (two) times daily.    . Coenzyme Q10 (CO Q-10) 100 MG CAPS Take 100 mg by mouth daily with breakfast.     . digoxin (LANOXIN) 0.125 MG tablet Take 0.125 mg by mouth daily with breakfast.     . finasteride (PROSCAR) 5 MG tablet Take 5 mg by mouth every morning.     . furosemide (LASIX) 20 MG tablet Take 40 mg by mouth daily.     . insulin aspart (NOVOLOG FLEXPEN) 100 UNIT/ML FlexPen Inject 0-4 Units into [name] skin 3 (three) times daily with meals. CBG 121 - 150: 1 unit,  CBG 151 - 200: 2 units,  CBG 201 - 250: 3 units,  CBG 251 - 300: 5 units,  CBG 301 - 350: 7 units,  CBG 351 - 400: 9 units    . Insulin Detemir (LEVEMIR FLEXTOUCH) 100 UNIT/ML Pen Inject 20 Units into [name] skin daily.    Marland Kitchen lisinopril (PRINIVIL,ZESTRIL) 20 MG tablet Take 40 mg by mouth daily with supper.    . Multiple Vitamin (MULTIVITAMIN WITH MINERALS) TABS tablet Take 1 tablet by mouth daily with breakfast.    . Omega-3 Fatty Acids (FISH OIL) 1200 MG CAPS Take 2 capsules by mouth 2 (two) times daily.     . pioglitazone (ACTOS) 15 MG tablet Take 7.5 mg by mouth every evening.     . polyethylene glycol (MIRALAX / GLYCOLAX) packet Take 17 g by mouth daily.    . potassium chloride SA (K-DUR,KLOR-CON) 20 MEQ tablet Take 40 mEq by mouth daily. Take starting 01/07/2018     . traMADol (ULTRAM) 50 MG tablet Take 1 tablet (50 mg total) by mouth every 6 (six) hours as needed for moderate pain. 30 tablet 0  . VICTOZA 18 MG/3ML SOPN Inject 1.8 mg into [name] skin daily with breakfast.    . vitamin E 400 UNIT capsule Take 400 Units by mouth every morning.    . warfarin (COUMADIN) 1 MG tablet Take 0.5 mg by  mouth daily. Along with 5 mg to = 5.5 daily    . warfarin (COUMADIN) 5 MG tablet Take 5 mg by mouth daily. Along with 0.5 mg to = 5.5 mg daily     No current facility-administered medications for this visit.    Allergies:  Erythromycin and Penicillins   Social History: [name] patient  reports that [name] [name] quit smoking about 55 years ago. His smoking use included cigarettes. [name] [name] quit after 10.00 years of use. [name] [name] has never used smokeless tobacco. [name] [name] reports that [name] [name] does not drink alcohol or use drugs.   ROS:  Please see [name] history of present illness. Otherwise, complete review of systems is positive for hearing loss.  All other systems are reviewed and negative.   Physical Exam: VS:  BP (!) 92/55 (BP Location: Right Arm)   Pulse 81   Ht 6' (1.829 m)   Wt 241 lb (109.3 kg)   SpO2 97%   BMI 32.69 kg/m , BMI Body mass index is 32.69 kg/m.  Wt Readings from Last 3 Encounters:  04/20/18 241 lb (109.3 kg)  03/24/18 234 lb 3.2 oz (106.2 kg)  03/21/18 241 lb (109.3 kg)    General: Patient appears comfortable at rest. HEENT: Conjunctiva and lids normal, oropharynx clear. Neck: Supple, no elevated JVP or carotid bruits, no thyromegaly. Lungs: Clear to auscultation, nonlabored breathing at rest. Cardiac: Irregularly irregular, no S3, 2/6 systolic murmur. Abdomen: Soft, nontender, bowel sounds  present. Extremities: 2+ lower leg edema, left greater than right, distal pulses 2+. Skin: Warm and dry. Musculoskeletal: No kyphosis. Neuropsychiatric: Alert and oriented x3, affect grossly appropriate.  ECG: I personally reviewed [name] tracing from 02/15/2018 which showed atrial fibrillation with right bundle branch block and left anterior fascicular block.  Recent Labwork: 12/14/2017: ALT 17; AST 19 02/15/2018: B Natriuretic Peptide 83.0 03/26/2018: Hemoglobin 10.7; Platelets 153 03/31/2018: BUN 25; Creatinine, Ser 0.76; Potassium 3.5; Sodium 139     Component Value Date/Time   CHOL 124 10/06/2016 0500   TRIG 150 (H) 10/06/2016 0500   HDL 23 (L) 10/06/2016 0500   CHOLHDL 5.4 10/06/2016 0500   VLDL 30 10/06/2016 0500   LDLCALC 71 10/06/2016 0500    Other Studies Reviewed Today:  Echocardiogram 08/04/2017: Study Conclusions  - Left ventricle: [name] cavity size was normal. Wall thickness was   increased in a pattern of mild LVH. Systolic function was normal.   [name] estimated ejection fraction was in [name] range of 60% to 65%.   Wall motion was normal; there were no regional wall motion   abnormalities. [name] study is not technically sufficient to allow   evaluation of LV diastolic function. - Aortic valve: Mildly calcified annulus. Mildly thickened   leaflets. There was mild stenosis. There was mild regurgitation.   Mean gradient (S): 10 mm Hg. Valve area (VTI): 1.7 cm^2. Valve   area (Vmax): 1.74 cm^2. Valve area (Vmean): 1.71 cm^2. - Mitral valve: Mildly calcified annulus. Mildly thickened leaflets. - Left atrium: [name] atrium was severely dilated. - Right ventricle: [name] cavity size was mildly dilated. - Right atrium: [name] atrium was moderately dilated. - Technically adequate study.  Assessment and Plan:  1.  Permanent atrial fibrillation.  Heart rate is well controlled, [name] [name] is on Lanoxin and continues with Coumadin for stroke prophylaxis.  Recent INR 2.6.  2.  Nonobstructive CAD  by prior work-up.  No active angina symptoms.  3.  Chronic recurring leg edema.  [name] [name] continues on Lasix at 40 mg daily with potassium supplements.  Echocardiogram from May of last year revealed LVEF 60 to 65%.  4.  Essential hypertension, blood pressure low normal today.  [name] [name] is asymptomatic.  Current medicines were reviewed with [name] patient today.  Disposition: Follow-up in 6 months.  Signed, Satira Sark, MD, [facility] 04/20/2018 2:29 PM    [facility] [address] [place], [place] [address] Phone: [phone]; Fax: [phone]

## 2018-04-20 NOTE — Patient Instructions (Signed)

## 2018-04-28 ENCOUNTER — Ambulatory Visit (INDEPENDENT_AMBULATORY_CARE_PROVIDER_SITE_OTHER): Payer: Medicare Other | Admitting: Pharmacist

## 2018-04-28 ENCOUNTER — Telehealth: Payer: Self-pay | Admitting: *Deleted

## 2018-04-28 DIAGNOSIS — I4891 Unspecified atrial fibrillation: Secondary | ICD-10-CM

## 2018-04-28 DIAGNOSIS — Z5181 Encounter for therapeutic drug level monitoring: Secondary | ICD-10-CM | POA: Diagnosis not present

## 2018-04-28 DIAGNOSIS — Z7901 Long term (current) use of anticoagulants: Secondary | ICD-10-CM

## 2018-04-28 LAB — POCT INR: INR: 2.3 (ref 2.0–3.0)

## 2018-04-28 MED ORDER — WARFARIN SODIUM 1 MG PO TABS: 0.5000 mg | ORAL_TABLET | Freq: Every day | ORAL | 0 refills | Status: DC

## 2018-04-28 NOTE — Patient Instructions (Signed)
Description   D/C from [facility] 12/26  Started Cipro 750mg  twice daily indefinitely - call if this is stopped D/C on coumadin 5.5mg  daily (has a 5mg  tablet and a 1 mg tablet) Continue coumadin 5.5mg  daily Recheck in 3 weeks

## 2018-04-29 NOTE — Telephone Encounter (Signed)
Pt was seen in [facility] yesterday 04/28/18 INR checked and dosed.

## 2018-05-09 ENCOUNTER — Ambulatory Visit (INDEPENDENT_AMBULATORY_CARE_PROVIDER_SITE_OTHER): Payer: Medicare Other | Admitting: Internal Medicine

## 2018-05-09 ENCOUNTER — Encounter: Payer: Self-pay | Admitting: Internal Medicine

## 2018-05-09 VITALS — BP 134/71 | HR 96 | Temp 97.5°F | Wt 242.0 lb

## 2018-05-09 DIAGNOSIS — M869 Osteomyelitis, unspecified: Secondary | ICD-10-CM | POA: Diagnosis not present

## 2018-05-09 DIAGNOSIS — Z5181 Encounter for therapeutic drug level monitoring: Secondary | ICD-10-CM

## 2018-05-09 DIAGNOSIS — I2510 Atherosclerotic heart disease of native coronary artery without angina pectoris: Secondary | ICD-10-CM | POA: Diagnosis not present

## 2018-05-09 DIAGNOSIS — I482 Chronic atrial fibrillation, unspecified: Secondary | ICD-10-CM

## 2018-05-09 MED ORDER — CIPROFLOXACIN HCL 750 MG PO TABS: 750.0000 mg | ORAL_TABLET | Freq: Two times a day (BID) | ORAL | 0 refills | Status: DC

## 2018-05-09 NOTE — Progress Notes (Signed)
   Subjective:    Patient ID: [name] [name], male    DOB: September 04, 1934, 84 y.o.   MRN: [id number]  HPI Here for follow up of malignant otitis media and skull osteomyelitis.   Underwent right revision mastoidectomy with drainage of abscess by Dr. Benjamine Mola on 11/13 and 2 previous long courses of IV antibiotics.  Has now been on meropenem for Pseudomonas (pansensitive) since his hospitalization and completed 6 weeks through 12/24.  He was sent out on cipro and has continued on that.  No associated rash, diarrhea.  No further drainage in ear and doing well symptomatically.  Here with his wife and daughter as usual and they confirm he is doing very well.  No fever, no chills.     Review of Systems  Constitutional: Negative for chills, fatigue and fever.  Gastrointestinal: Negative for diarrhea and nausea.  Skin: Negative for rash.       Objective:   Physical Exam Constitutional:      Appearance: Normal appearance.  Eyes:     General: No scleral icterus. Cardiovascular:     Rate and Rhythm: Normal rate and regular rhythm.     Heart sounds: No murmur.  Pulmonary:     Effort: Pulmonary effort is normal. No respiratory distress.     Breath sounds: Normal breath sounds.  Musculoskeletal:        General: No swelling.  Skin:    Findings: No rash.  Neurological:     Mental Status: He is alert.   SH: now back at home        Assessment & Plan:

## 2018-05-09 NOTE — Assessment & Plan Note (Signed)
Malignant otitis media/osteomyelitis. Doing very well and seems to have resolved.  I will have him complete 1 more month and stop. He will return in 6 weeks, 2 weeks after completion of his antibiotics

## 2018-05-09 NOTE — Assessment & Plan Note (Signed)
Will alert his pharmacist of stopping cipro which interacts with his coumadin.

## 2018-05-09 NOTE — Assessment & Plan Note (Signed)
Will check a bmp with his next blood draw

## 2018-05-16 ENCOUNTER — Ambulatory Visit (INDEPENDENT_AMBULATORY_CARE_PROVIDER_SITE_OTHER): Payer: Medicare Other | Admitting: Otolaryngology

## 2018-05-17 DIAGNOSIS — Z789 Other specified health status: Secondary | ICD-10-CM | POA: Diagnosis not present

## 2018-05-17 DIAGNOSIS — G510 Bell's palsy: Secondary | ICD-10-CM | POA: Diagnosis not present

## 2018-05-17 DIAGNOSIS — Z299 Encounter for prophylactic measures, unspecified: Secondary | ICD-10-CM | POA: Diagnosis not present

## 2018-05-17 DIAGNOSIS — E1142 Type 2 diabetes mellitus with diabetic polyneuropathy: Secondary | ICD-10-CM | POA: Diagnosis not present

## 2018-05-17 DIAGNOSIS — E1165 Type 2 diabetes mellitus with hyperglycemia: Secondary | ICD-10-CM | POA: Diagnosis not present

## 2018-05-17 DIAGNOSIS — I10 Essential (primary) hypertension: Secondary | ICD-10-CM | POA: Diagnosis not present

## 2018-05-17 DIAGNOSIS — Z6835 Body mass index (BMI) 35.0-35.9, adult: Secondary | ICD-10-CM | POA: Diagnosis not present

## 2018-05-19 ENCOUNTER — Ambulatory Visit (INDEPENDENT_AMBULATORY_CARE_PROVIDER_SITE_OTHER): Payer: Medicare Other | Admitting: Pharmacist

## 2018-05-19 DIAGNOSIS — M86171 Other acute osteomyelitis, right ankle and foot: Secondary | ICD-10-CM

## 2018-05-19 DIAGNOSIS — Z5181 Encounter for therapeutic drug level monitoring: Secondary | ICD-10-CM | POA: Diagnosis not present

## 2018-05-19 DIAGNOSIS — Z7901 Long term (current) use of anticoagulants: Secondary | ICD-10-CM

## 2018-05-19 DIAGNOSIS — N390 Urinary tract infection, site not specified: Secondary | ICD-10-CM | POA: Diagnosis not present

## 2018-05-19 DIAGNOSIS — M869 Osteomyelitis, unspecified: Secondary | ICD-10-CM

## 2018-05-19 DIAGNOSIS — I4891 Unspecified atrial fibrillation: Secondary | ICD-10-CM

## 2018-05-19 LAB — POCT INR: INR: 2 (ref 2.0–3.0)

## 2018-05-19 NOTE — Patient Instructions (Signed)
Description   Started Cipro 750mg  twice daily planned stop date on 06/10/18 Continue coumadin 5.5mg  daily (has a 5mg  tablet and a 1 mg tablet) Recheck a few days after stopping Cipro.

## 2018-06-10 DIAGNOSIS — I10 Essential (primary) hypertension: Secondary | ICD-10-CM | POA: Diagnosis not present

## 2018-06-10 DIAGNOSIS — I4891 Unspecified atrial fibrillation: Secondary | ICD-10-CM | POA: Diagnosis not present

## 2018-06-10 DIAGNOSIS — E119 Type 2 diabetes mellitus without complications: Secondary | ICD-10-CM | POA: Diagnosis not present

## 2018-06-14 ENCOUNTER — Ambulatory Visit (INDEPENDENT_AMBULATORY_CARE_PROVIDER_SITE_OTHER): Payer: Medicare Other | Admitting: *Deleted

## 2018-06-14 DIAGNOSIS — Z7901 Long term (current) use of anticoagulants: Secondary | ICD-10-CM | POA: Diagnosis not present

## 2018-06-14 DIAGNOSIS — Z5181 Encounter for therapeutic drug level monitoring: Secondary | ICD-10-CM

## 2018-06-14 DIAGNOSIS — I4891 Unspecified atrial fibrillation: Secondary | ICD-10-CM

## 2018-06-14 LAB — POCT INR: INR: 1.9 — AB (ref 2.0–3.0)

## 2018-06-14 NOTE — Patient Instructions (Signed)
Finished Cipro on 3/8 Start coumadin 1 tablet daily except 1 1/2 tablets on Tuesdays, Thursdays and Saturdays Recheck in 1 week

## 2018-06-21 ENCOUNTER — Ambulatory Visit (INDEPENDENT_AMBULATORY_CARE_PROVIDER_SITE_OTHER): Payer: Medicare Other | Admitting: *Deleted

## 2018-06-21 ENCOUNTER — Telehealth: Payer: Self-pay | Admitting: Behavioral Health

## 2018-06-21 ENCOUNTER — Other Ambulatory Visit: Payer: Self-pay

## 2018-06-21 DIAGNOSIS — I4891 Unspecified atrial fibrillation: Secondary | ICD-10-CM

## 2018-06-21 DIAGNOSIS — I480 Paroxysmal atrial fibrillation: Secondary | ICD-10-CM | POA: Diagnosis not present

## 2018-06-21 DIAGNOSIS — Z5181 Encounter for therapeutic drug level monitoring: Secondary | ICD-10-CM

## 2018-06-21 DIAGNOSIS — Z7901 Long term (current) use of anticoagulants: Secondary | ICD-10-CM | POA: Diagnosis not present

## 2018-06-21 LAB — POCT INR: INR: 1.8 — AB (ref 2.0–3.0)

## 2018-06-21 NOTE — Telephone Encounter (Signed)
Patient's wife called to reschedule [name]'s appointment because they do not want to come out of the house while until the Covid-19 dies down.  Appointment was rescheduled for 07/18/2018. Patient's wife wanted Dr. Linus Salmons to be aware and if they need to be seen sooner.  Patient finished his oral Cipro last week and is feeling fine.  Explained to patient if Dr. Linus Salmons had additional information will give them a call back for an update. Pricilla Riffle RN

## 2018-06-21 NOTE — Patient Instructions (Signed)
Increase coumadin to 1 1/2 tablets daily except 1 tablet on Mondays and Thursdays Recheck in 2 weeks

## 2018-06-22 NOTE — Telephone Encounter (Signed)
Agree.  He does not need to come back if he is doing well.  Thanks

## 2018-06-27 ENCOUNTER — Ambulatory Visit: Payer: Medicare Other | Admitting: Internal Medicine

## 2018-07-01 DIAGNOSIS — E1142 Type 2 diabetes mellitus with diabetic polyneuropathy: Secondary | ICD-10-CM | POA: Diagnosis not present

## 2018-07-01 DIAGNOSIS — L851 Acquired keratosis [keratoderma] palmaris et plantaris: Secondary | ICD-10-CM | POA: Diagnosis not present

## 2018-07-01 DIAGNOSIS — B351 Tinea unguium: Secondary | ICD-10-CM | POA: Diagnosis not present

## 2018-07-04 ENCOUNTER — Other Ambulatory Visit: Payer: Self-pay

## 2018-07-05 ENCOUNTER — Ambulatory Visit (INDEPENDENT_AMBULATORY_CARE_PROVIDER_SITE_OTHER): Payer: Medicare Other | Admitting: *Deleted

## 2018-07-05 DIAGNOSIS — Z5181 Encounter for therapeutic drug level monitoring: Secondary | ICD-10-CM | POA: Diagnosis not present

## 2018-07-05 DIAGNOSIS — I4891 Unspecified atrial fibrillation: Secondary | ICD-10-CM | POA: Diagnosis not present

## 2018-07-05 DIAGNOSIS — I482 Chronic atrial fibrillation, unspecified: Secondary | ICD-10-CM | POA: Diagnosis not present

## 2018-07-05 DIAGNOSIS — Z7901 Long term (current) use of anticoagulants: Secondary | ICD-10-CM

## 2018-07-05 LAB — POCT INR: INR: 1.4 — AB (ref 2.0–3.0)

## 2018-07-05 NOTE — Patient Instructions (Signed)
Take 2 tablets tonight then increase 1 1/2 tablets daily Recheck in 10 days

## 2018-07-13 ENCOUNTER — Telehealth: Payer: Self-pay | Admitting: Cardiology

## 2018-07-13 DIAGNOSIS — I10 Essential (primary) hypertension: Secondary | ICD-10-CM | POA: Diagnosis not present

## 2018-07-13 DIAGNOSIS — I4891 Unspecified atrial fibrillation: Secondary | ICD-10-CM | POA: Diagnosis not present

## 2018-07-13 DIAGNOSIS — E119 Type 2 diabetes mellitus without complications: Secondary | ICD-10-CM | POA: Diagnosis not present

## 2018-07-13 NOTE — Telephone Encounter (Signed)
Answered No to all covid 19 screening questions °

## 2018-07-13 NOTE — Telephone Encounter (Signed)
07/13/18 LMTCB needs to be pre-registered/Covid 19 Screened-srs °COVID-19 Pre-Screening Questions: ° °• Do you currently have a fever? °•  °• Have you recently travelled on a cruise, internationally, or to [place], [place], [place], [place], [place], or [place], [place] ([employer]) ? °•  °• Have you been in contact with someone that is currently pending confirmation of Covid19 testing or has been confirmed to have the Covid19 virus? °•  °• Are you currently experiencing fatigue or cough? °•  ° ° °   ° ° ° ° °

## 2018-07-14 ENCOUNTER — Ambulatory Visit (INDEPENDENT_AMBULATORY_CARE_PROVIDER_SITE_OTHER): Payer: Medicare Other | Admitting: *Deleted

## 2018-07-14 DIAGNOSIS — I4891 Unspecified atrial fibrillation: Secondary | ICD-10-CM | POA: Diagnosis not present

## 2018-07-14 DIAGNOSIS — Z5181 Encounter for therapeutic drug level monitoring: Secondary | ICD-10-CM

## 2018-07-14 DIAGNOSIS — Z7901 Long term (current) use of anticoagulants: Secondary | ICD-10-CM

## 2018-07-14 DIAGNOSIS — I480 Paroxysmal atrial fibrillation: Secondary | ICD-10-CM

## 2018-07-14 LAB — POCT INR: INR: 2.2 (ref 2.0–3.0)

## 2018-07-14 NOTE — Patient Instructions (Signed)
Continue coumadin 1 1/2 tablets daily Recheck in 2 weeks

## 2018-07-18 ENCOUNTER — Ambulatory Visit: Payer: Medicare Other | Admitting: Internal Medicine

## 2018-07-26 ENCOUNTER — Telehealth: Payer: Self-pay | Admitting: *Deleted

## 2018-07-26 NOTE — Telephone Encounter (Signed)

## 2018-07-27 ENCOUNTER — Ambulatory Visit (INDEPENDENT_AMBULATORY_CARE_PROVIDER_SITE_OTHER): Payer: Medicare Other | Admitting: *Deleted

## 2018-07-27 DIAGNOSIS — Z7901 Long term (current) use of anticoagulants: Secondary | ICD-10-CM

## 2018-07-27 DIAGNOSIS — I4891 Unspecified atrial fibrillation: Secondary | ICD-10-CM

## 2018-07-27 DIAGNOSIS — Z5181 Encounter for therapeutic drug level monitoring: Secondary | ICD-10-CM | POA: Diagnosis not present

## 2018-07-27 LAB — POCT INR: INR: 2.3 (ref 2.0–3.0)

## 2018-07-27 NOTE — Patient Instructions (Signed)
Continue coumadin 1 1/2 tablets daily Recheck in 3 weeks

## 2018-08-11 ENCOUNTER — Telehealth: Payer: Self-pay | Admitting: Pharmacist

## 2018-08-11 NOTE — Telephone Encounter (Signed)
Called pt to discuss changing from warfarin to [facility] to help reduce office visits and exposure during COVID-19 pandemic as well as for increased efficacy and better safety profile. Pt did not answer, no VM set up, will try again later.

## 2018-08-12 DIAGNOSIS — E119 Type 2 diabetes mellitus without complications: Secondary | ICD-10-CM | POA: Diagnosis not present

## 2018-08-12 DIAGNOSIS — I4891 Unspecified atrial fibrillation: Secondary | ICD-10-CM | POA: Diagnosis not present

## 2018-08-12 DIAGNOSIS — I10 Essential (primary) hypertension: Secondary | ICD-10-CM | POA: Diagnosis not present

## 2018-08-12 NOTE — Telephone Encounter (Signed)
Spoke with patient's wife who states that pt would prefer to stay on warfarin for now.

## 2018-08-17 ENCOUNTER — Ambulatory Visit (INDEPENDENT_AMBULATORY_CARE_PROVIDER_SITE_OTHER): Payer: Medicare Other | Admitting: *Deleted

## 2018-08-17 DIAGNOSIS — Z7901 Long term (current) use of anticoagulants: Secondary | ICD-10-CM

## 2018-08-17 DIAGNOSIS — Z5181 Encounter for therapeutic drug level monitoring: Secondary | ICD-10-CM | POA: Diagnosis not present

## 2018-08-17 DIAGNOSIS — I4891 Unspecified atrial fibrillation: Secondary | ICD-10-CM

## 2018-08-17 LAB — POCT INR: INR: 2.1 (ref 2.0–3.0)

## 2018-08-17 NOTE — Patient Instructions (Signed)
Continue coumadin 1 1/2 tablets daily Recheck in 4 weeks . 

## 2018-09-09 DIAGNOSIS — L851 Acquired keratosis [keratoderma] palmaris et plantaris: Secondary | ICD-10-CM | POA: Diagnosis not present

## 2018-09-09 DIAGNOSIS — B351 Tinea unguium: Secondary | ICD-10-CM | POA: Diagnosis not present

## 2018-09-09 DIAGNOSIS — E1142 Type 2 diabetes mellitus with diabetic polyneuropathy: Secondary | ICD-10-CM | POA: Diagnosis not present

## 2018-09-12 DIAGNOSIS — E119 Type 2 diabetes mellitus without complications: Secondary | ICD-10-CM | POA: Diagnosis not present

## 2018-09-12 DIAGNOSIS — I10 Essential (primary) hypertension: Secondary | ICD-10-CM | POA: Diagnosis not present

## 2018-09-12 DIAGNOSIS — I4891 Unspecified atrial fibrillation: Secondary | ICD-10-CM | POA: Diagnosis not present

## 2018-09-13 ENCOUNTER — Telehealth: Payer: Self-pay | Admitting: *Deleted

## 2018-09-13 NOTE — Telephone Encounter (Signed)

## 2018-09-14 ENCOUNTER — Ambulatory Visit (INDEPENDENT_AMBULATORY_CARE_PROVIDER_SITE_OTHER): Payer: Medicare Other | Admitting: *Deleted

## 2018-09-14 DIAGNOSIS — I4891 Unspecified atrial fibrillation: Secondary | ICD-10-CM

## 2018-09-14 DIAGNOSIS — Z7901 Long term (current) use of anticoagulants: Secondary | ICD-10-CM

## 2018-09-14 DIAGNOSIS — Z5181 Encounter for therapeutic drug level monitoring: Secondary | ICD-10-CM

## 2018-09-14 LAB — POCT INR: INR: 2.8 (ref 2.0–3.0)

## 2018-09-14 NOTE — Patient Instructions (Signed)
Continue coumadin 1 1/2 tablets daily  Recheck in 6 weeks 

## 2018-10-06 DIAGNOSIS — I10 Essential (primary) hypertension: Secondary | ICD-10-CM | POA: Diagnosis not present

## 2018-10-06 DIAGNOSIS — E119 Type 2 diabetes mellitus without complications: Secondary | ICD-10-CM | POA: Diagnosis not present

## 2018-10-06 DIAGNOSIS — I4891 Unspecified atrial fibrillation: Secondary | ICD-10-CM | POA: Diagnosis not present

## 2018-10-21 DIAGNOSIS — E1142 Type 2 diabetes mellitus with diabetic polyneuropathy: Secondary | ICD-10-CM | POA: Diagnosis not present

## 2018-10-21 DIAGNOSIS — L84 Corns and callosities: Secondary | ICD-10-CM | POA: Diagnosis not present

## 2018-10-27 ENCOUNTER — Encounter: Payer: Self-pay | Admitting: Cardiology

## 2018-10-27 ENCOUNTER — Ambulatory Visit (INDEPENDENT_AMBULATORY_CARE_PROVIDER_SITE_OTHER): Payer: Medicare Other | Admitting: *Deleted

## 2018-10-27 ENCOUNTER — Ambulatory Visit (INDEPENDENT_AMBULATORY_CARE_PROVIDER_SITE_OTHER): Payer: Medicare Other | Admitting: Cardiology

## 2018-10-27 ENCOUNTER — Other Ambulatory Visit: Payer: Self-pay

## 2018-10-27 VITALS — BP 122/50 | HR 62 | Temp 98.9°F | Ht 72.0 in | Wt 251.0 lb

## 2018-10-27 DIAGNOSIS — I482 Chronic atrial fibrillation, unspecified: Secondary | ICD-10-CM

## 2018-10-27 DIAGNOSIS — I4821 Permanent atrial fibrillation: Secondary | ICD-10-CM

## 2018-10-27 DIAGNOSIS — I10 Essential (primary) hypertension: Secondary | ICD-10-CM

## 2018-10-27 DIAGNOSIS — Z5181 Encounter for therapeutic drug level monitoring: Secondary | ICD-10-CM | POA: Diagnosis not present

## 2018-10-27 DIAGNOSIS — I2510 Atherosclerotic heart disease of native coronary artery without angina pectoris: Secondary | ICD-10-CM

## 2018-10-27 LAB — POCT INR: INR: 2.4 (ref 2.0–3.0)

## 2018-10-27 MED ORDER — WARFARIN SODIUM 1 MG PO TABS: 0.5000 mg | ORAL_TABLET | Freq: Every day | ORAL | 3 refills | Status: DC

## 2018-10-27 MED ORDER — WARFARIN SODIUM 5 MG PO TABS: 5.0000 mg | ORAL_TABLET | Freq: Every day | ORAL | 3 refills | Status: DC

## 2018-10-27 NOTE — Progress Notes (Signed)
Cardiology Office Note  Date: 10/27/2018   ID: [name] [name], DOB 1934-11-05, MRN [id number]  PCP:  [name], MD  Cardiologist:  [name], MD Electrophysiologist:  None   Chief Complaint  Patient presents with  . Atrial Fibrillation    History of Present Illness: [name] [name] is an 84 y.o. male last seen in January.  He is here today for a routine visit.  He does not report any palpitations or chest pain.  He feels like his stamina has improved somewhat in the last few months.  He states that he has been social distancing, wears a mask when he goes out.  I reviewed his medications which are outlined below and stable from a cardiac perspective.  He is due for a follow-up visit with PCP.  He continues on Coumadin with follow-up in anticoagulation clinic.  He does not report any spontaneous bleeding problems.  Last INR was 2.8.  Past Medical History:  Diagnosis Date  . Atrial fibrillation ([place])   . Chronic lymphocytic leukemia ([place])   . Coronary atherosclerosis of native coronary artery    Nonobstructive  . Essential hypertension   . Hyperlipidemia   . IgA nephropathy    Dr. Lowanda Foster  . Type 2 diabetes mellitus ([place])   . UTI (urinary tract infection)     Past Surgical History:  Procedure Laterality Date  . AMPUTATION Left 09/15/2012   Procedure: PARTIAL AMPUTATION 3rd TOE LEFT FOOT;  Surgeon: Marcheta Grammes, DPM;  [facility];  Service: Orthopedics;  Laterality: Left;  . AMPUTATION Left 04/20/2013   Procedure: PARTIAL AMPUTATION 2ND TOE LEFT FOOT;  Surgeon: Marcheta Grammes, DPM;  [facility];  Service: Orthopedics;  Laterality: Left;  . ANAL FISSURE REPAIR    . APPLICATION OF WOUND VAC Right 09/09/2016   Procedure: APPLICATION OF WOUND VAC;  Surgeon: Caprice Beaver, DPM;  [facility];  Service: Podiatry;  Laterality: Right;  . CATARACT EXTRACTION W/PHACO Right 09/30/2015   Procedure: CATARACT EXTRACTION PHACO AND INTRAOCULAR LENS  PLACEMENT (IOC);  Surgeon: Tonny Branch, MD;  [facility];  Service: Ophthalmology;  Laterality: Right;  CDE: 11.78  . CATARACT EXTRACTION W/PHACO Left 10/31/2015   Procedure: CATARACT EXTRACTION PHACO AND INTRAOCULAR LENS PLACEMENT LEFT EYE; CDE:  9.10;  Surgeon: Tonny Branch, MD;  [facility];  Service: Ophthalmology;  Laterality: Left;  . COLONOSCOPY  08/19/2011   Procedure: COLONOSCOPY;  Surgeon: Rogene Houston, MD;  [facility];  Service: Endoscopy;  Laterality: N/A;  930  . COLONOSCOPY N/A 09/19/2014   Procedure: COLONOSCOPY;  Surgeon: Rogene Houston, MD;  [facility];  Service: Endoscopy;  Laterality: N/A;  1055  . FEMORAL HERNIA REPAIR    . INCISION AND DRAINAGE Right 09/09/2016   Procedure: DEBRIDEMENT WOUND RT heel with wound vac attachment;  Surgeon: Caprice Beaver, DPM;  [facility];  Service: Podiatry;  Laterality: Right;  . INCISION AND DRAINAGE ABSCESS Right 02/16/2018   Procedure: INCISION AND DRAINAGE ABSCESS;  Surgeon: Leta Baptist, MD;  Location: [place];  Service: ENT;  Laterality: Right;  . INCISION AND DRAINAGE OF WOUND Right 08/12/2016   Procedure: DEBRIDEMENT WOUND RIGHT HEEL;  Surgeon: Caprice Beaver, DPM;  [facility];  Service: Podiatry;  Laterality: Right;  right heel  . MASS EXCISION Right 09/28/2017   Procedure: EXCISION OF EXTERNAL AUDITORY CANAL MASS;  Surgeon: Leta Baptist, MD;  Location: [place];  Service: ENT;  Laterality: Right;  .  MASTOIDECTOMY Right 02/16/2018   Procedure: REVISION MASTOIDECTOMY;  Surgeon: Leta Baptist, MD;  [facility];  Service: ENT;  Laterality: Right;  . Open repair left quadriceps tendon.  08/22/07   Dr. Aline Brochure  . Right total knee replacement    . TRANSURETHRAL RESECTION OF PROSTATE    . TYMPANOMASTOIDECTOMY Right 11/10/2017   Procedure: RIGHT TYMPANOMASTOIDECTOMY;  Surgeon: Leta Baptist, MD;  Location: [place];  Service: ENT;  Laterality: Right;  . WOUND EXPLORATION  Right 08/12/2016   Procedure: EXPLORATION OF WOUND FOR FOREIGN BODY RIGHT HEEL;  Surgeon: Caprice Beaver, DPM;  [facility];  Service: Podiatry;  Laterality: Right;  right heel    Current Outpatient Medications  Medication Sig Dispense Refill  . Ascorbic Acid (VITAMIN C) 1000 MG tablet Take 1,000 mg by mouth daily with breakfast.     . atorvastatin (LIPITOR) 10 MG tablet Take 10 mg by mouth every evening.    . Calcium Carb-Cholecalciferol (CALCIUM 600+D) 600-800 MG-UNIT TABS Take 2 tablets by mouth daily with breakfast.    . cholecalciferol (VITAMIN D) 1000 units tablet Take 1,000 Units by mouth every morning.    . ciclopirox (LOPROX) 0.77 % cream Apply 1 application topically every morning. APPLY TO THE FEET    . Coenzyme Q10 (CO Q-10) 100 MG CAPS Take 100 mg by mouth daily with breakfast.     . digoxin (LANOXIN) 0.125 MG tablet Take 0.125 mg by mouth daily with breakfast.     . finasteride (PROSCAR) 5 MG tablet Take 5 mg by mouth every morning.     . furosemide (LASIX) 20 MG tablet Take 40 mg by mouth daily.     . insulin aspart (NOVOLOG FLEXPEN) 100 UNIT/ML FlexPen Inject 0-4 Units into the skin 3 (three) times daily with meals. CBG 121 - 150: 1 unit,  CBG 151 - 200: 2 units,  CBG 201 - 250: 3 units,  CBG 251 - 300: 5 units,  CBG 301 - 350: 7 units,  CBG 351 - 400: 9 units    . Insulin Detemir (LEVEMIR FLEXTOUCH) 100 UNIT/ML Pen Inject 20 Units into the skin daily.    Marland Kitchen lisinopril (PRINIVIL,ZESTRIL) 20 MG tablet Take 40 mg by mouth daily with supper.    . Multiple Vitamin (MULTIVITAMIN WITH MINERALS) TABS tablet Take 1 tablet by mouth daily with breakfast.    . Omega-3 Fatty Acids (FISH OIL) 1200 MG CAPS Take 2 capsules by mouth 2 (two) times daily.    . pioglitazone (ACTOS) 15 MG tablet Take 7.5 mg by mouth every evening.     . polyethylene glycol (MIRALAX / GLYCOLAX) packet Take 17 g by mouth daily.    . potassium chloride SA (K-DUR,KLOR-CON) 20 MEQ tablet Take 40 mEq by mouth  daily. Take starting 01/07/2018     . traMADol (ULTRAM) 50 MG tablet Take 1 tablet (50 mg total) by mouth every 6 (six) hours as needed for moderate pain. 30 tablet 0  . VICTOZA 18 MG/3ML SOPN Inject 1.8 mg into the skin daily with breakfast.    . vitamin E 400 UNIT capsule Take 400 Units by mouth every morning.    . warfarin (COUMADIN) 1 MG tablet Take 0.5 tablets (0.5 mg total) by mouth daily. Along with 5 mg to = 5.5 daily or as directed by coumadin clinic 30 tablet 3  . warfarin (COUMADIN) 5 MG tablet Take 1 tablet (5 mg total) by mouth daily. Along with 0.5 mg to = 5.5 mg daily  30 tablet 3   No current facility-administered medications for this visit.    Allergies:  Erythromycin and Penicillins   Social History: The patient  reports that he quit smoking about 55 years ago. His smoking use included cigarettes. He quit after 10.00 years of use. He has never used smokeless tobacco. He reports that he does not drink alcohol or use drugs.   ROS:  Please see the history of present illness. Otherwise, complete review of systems is positive for hearing loss.  All other systems are reviewed and negative.   Physical Exam: VS:  BP (!) 122/50   Pulse 62   Temp 98.9 F (37.2 C)   Ht 6' (1.829 m)   Wt 251 lb (113.9 kg)   SpO2 96%   BMI 34.04 kg/m , BMI Body mass index is 34.04 kg/m.  Wt Readings from Last 3 Encounters:  10/27/18 251 lb (113.9 kg)  05/09/18 242 lb (109.8 kg)  04/20/18 241 lb (109.3 kg)    General: Elderly male, appears comfortable at rest. HEENT: Conjunctiva and lids normal, wearing a mask. Neck: Supple, no elevated JVP or carotid bruits, no thyromegaly. Lungs: Clear to auscultation, nonlabored breathing at rest. Cardiac: Irregularly irregular, no S3, 2/6 systolic murmur. Abdomen: Soft, nontender, bowel sounds present. Extremities: Stable, chronic appearing lower leg edema, distal pulses 2+. Skin: Warm and dry. Musculoskeletal: No kyphosis. Neuropsychiatric: Alert and  oriented x3, affect grossly appropriate.  ECG:  An ECG dated 02/15/2018 was personally reviewed today and demonstrated:  Atrial fibrillation with right bundle branch block and left anterior fascicular block.  Recent Labwork: 12/14/2017: ALT 17; AST 19 02/15/2018: B Natriuretic Peptide 83.0 03/26/2018: Hemoglobin 10.7; Platelets 153 03/31/2018: BUN 25; Creatinine, Ser 0.76; Potassium 3.5; Sodium 139     Component Value Date/Time   CHOL 124 10/06/2016 0500   TRIG 150 (H) 10/06/2016 0500   HDL 23 (L) 10/06/2016 0500   CHOLHDL 5.4 10/06/2016 0500   VLDL 30 10/06/2016 0500   LDLCALC 71 10/06/2016 0500    Other Studies Reviewed Today:  Echocardiogram 08/04/2017: Study Conclusions  - Left ventricle: The cavity size was normal. Wall thickness was increased in a pattern of mild LVH. Systolic function was normal. The estimated ejection fraction was in the range of 60% to 65%. Wall motion was normal; there were no regional wall motion abnormalities. The study is not technically sufficient to allow evaluation of LV diastolic function. - Aortic valve: Mildly calcified annulus. Mildly thickened leaflets. There was mild stenosis. There was mild regurgitation. Mean gradient (S): 10 mm Hg. Valve area (VTI): 1.7 cm^2. Valve area (Vmax): 1.74 cm^2. Valve area (Vmean): 1.71 cm^2. - Mitral valve: Mildly calcified annulus. Mildly thickened leaflets. - Left atrium: The atrium was severely dilated. - Right ventricle: The cavity size was mildly dilated. - Right atrium: The atrium was moderately dilated. - Technically adequate study.  Assessment and Plan:  1.  Permanent atrial fibrillation.  He reports no palpitations and has good heart rate control and Lanoxin which he is tolerated long-term.  Last check of creatinine was normal.  Continue Coumadin for stroke prophylaxis with follow-up in anticoagulation clinic.  2.  Chronic recurring lower extremity edema.  He remains on diuretic  therapy with potassium supplements and reports no worsening symptoms in the interim.  3.  Nonobstructive CAD by history.  He does not report any active angina at this time.  He is not on aspirin given concurrent use of Coumadin.  4.  Essential hypertension, systolic is in  the 120s today.  Keep follow-up with PCP.  Medication Adjustments/Labs and Tests Ordered: Current medicines are reviewed at length with the patient today.  Concerns regarding medicines are outlined above.   Tests Ordered: No orders of the defined types were placed in this encounter.   Medication Changes: Meds ordered this encounter  Medications  . warfarin (COUMADIN) 5 MG tablet    Sig: Take 1 tablet (5 mg total) by mouth daily. Along with 0.5 mg to = 5.5 mg daily    Dispense:  30 tablet    Refill:  3  . warfarin (COUMADIN) 1 MG tablet    Sig: Take 0.5 tablets (0.5 mg total) by mouth daily. Along with 5 mg to = 5.5 daily or as directed by coumadin clinic    Dispense:  30 tablet    Refill:  3    Disposition:  Follow up 6 months in the [place] office.  Signed, Satira Sark, MD, [facility] 10/27/2018 11:15 AM    [place] [facility] at [place], [place], [place] [address] Phone: [phone]; Fax: [phone]

## 2018-10-27 NOTE — Patient Instructions (Addendum)

## 2018-10-27 NOTE — Patient Instructions (Signed)
Continue coumadin 1 1/2 tablets daily  Recheck in 6 weeks 

## 2018-10-31 ENCOUNTER — Telehealth: Payer: Self-pay | Admitting: *Deleted

## 2018-10-31 MED ORDER — WARFARIN SODIUM 5 MG PO TABS: ORAL_TABLET | ORAL | 4 refills | Status: DC

## 2018-10-31 NOTE — Telephone Encounter (Signed)
Would like to discuss medication called in

## 2018-11-01 DIAGNOSIS — E1142 Type 2 diabetes mellitus with diabetic polyneuropathy: Secondary | ICD-10-CM | POA: Diagnosis not present

## 2018-11-01 DIAGNOSIS — Z299 Encounter for prophylactic measures, unspecified: Secondary | ICD-10-CM | POA: Diagnosis not present

## 2018-11-01 DIAGNOSIS — Z6837 Body mass index (BMI) 37.0-37.9, adult: Secondary | ICD-10-CM | POA: Diagnosis not present

## 2018-11-01 DIAGNOSIS — E1165 Type 2 diabetes mellitus with hyperglycemia: Secondary | ICD-10-CM | POA: Diagnosis not present

## 2018-11-01 DIAGNOSIS — I4891 Unspecified atrial fibrillation: Secondary | ICD-10-CM | POA: Diagnosis not present

## 2018-11-01 DIAGNOSIS — I10 Essential (primary) hypertension: Secondary | ICD-10-CM | POA: Diagnosis not present

## 2018-11-08 DIAGNOSIS — I10 Essential (primary) hypertension: Secondary | ICD-10-CM | POA: Diagnosis not present

## 2018-11-08 DIAGNOSIS — I4891 Unspecified atrial fibrillation: Secondary | ICD-10-CM | POA: Diagnosis not present

## 2018-11-08 DIAGNOSIS — Z0000 Encounter for general adult medical examination without abnormal findings: Secondary | ICD-10-CM | POA: Diagnosis not present

## 2018-11-08 DIAGNOSIS — E119 Type 2 diabetes mellitus without complications: Secondary | ICD-10-CM | POA: Diagnosis not present

## 2018-11-15 DIAGNOSIS — R3914 Feeling of incomplete bladder emptying: Secondary | ICD-10-CM | POA: Diagnosis not present

## 2018-11-15 DIAGNOSIS — N401 Enlarged prostate with lower urinary tract symptoms: Secondary | ICD-10-CM | POA: Diagnosis not present

## 2018-11-15 DIAGNOSIS — R350 Frequency of micturition: Secondary | ICD-10-CM | POA: Diagnosis not present

## 2018-11-18 DIAGNOSIS — B351 Tinea unguium: Secondary | ICD-10-CM | POA: Diagnosis not present

## 2018-11-18 DIAGNOSIS — E1142 Type 2 diabetes mellitus with diabetic polyneuropathy: Secondary | ICD-10-CM | POA: Diagnosis not present

## 2018-11-18 DIAGNOSIS — L851 Acquired keratosis [keratoderma] palmaris et plantaris: Secondary | ICD-10-CM | POA: Diagnosis not present

## 2018-11-21 ENCOUNTER — Ambulatory Visit (INDEPENDENT_AMBULATORY_CARE_PROVIDER_SITE_OTHER): Payer: Medicare Other | Admitting: Otolaryngology

## 2018-11-21 DIAGNOSIS — H7011 Chronic mastoiditis, right ear: Secondary | ICD-10-CM

## 2018-12-06 DIAGNOSIS — F419 Anxiety disorder, unspecified: Secondary | ICD-10-CM | POA: Diagnosis not present

## 2018-12-06 DIAGNOSIS — Z0000 Encounter for general adult medical examination without abnormal findings: Secondary | ICD-10-CM | POA: Diagnosis not present

## 2018-12-06 DIAGNOSIS — Z6838 Body mass index (BMI) 38.0-38.9, adult: Secondary | ICD-10-CM | POA: Diagnosis not present

## 2018-12-06 DIAGNOSIS — Z1339 Encounter for screening examination for other mental health and behavioral disorders: Secondary | ICD-10-CM | POA: Diagnosis not present

## 2018-12-06 DIAGNOSIS — Z7189 Other specified counseling: Secondary | ICD-10-CM | POA: Diagnosis not present

## 2018-12-06 DIAGNOSIS — E7800 Pure hypercholesterolemia, unspecified: Secondary | ICD-10-CM | POA: Diagnosis not present

## 2018-12-06 DIAGNOSIS — R5383 Other fatigue: Secondary | ICD-10-CM | POA: Diagnosis not present

## 2018-12-06 DIAGNOSIS — Z299 Encounter for prophylactic measures, unspecified: Secondary | ICD-10-CM | POA: Diagnosis not present

## 2018-12-06 DIAGNOSIS — Z1331 Encounter for screening for depression: Secondary | ICD-10-CM | POA: Diagnosis not present

## 2018-12-06 DIAGNOSIS — Z1211 Encounter for screening for malignant neoplasm of colon: Secondary | ICD-10-CM | POA: Diagnosis not present

## 2018-12-07 DIAGNOSIS — Z79899 Other long term (current) drug therapy: Secondary | ICD-10-CM | POA: Diagnosis not present

## 2018-12-07 DIAGNOSIS — E7800 Pure hypercholesterolemia, unspecified: Secondary | ICD-10-CM | POA: Diagnosis not present

## 2018-12-07 DIAGNOSIS — F419 Anxiety disorder, unspecified: Secondary | ICD-10-CM | POA: Diagnosis not present

## 2018-12-07 DIAGNOSIS — Z125 Encounter for screening for malignant neoplasm of prostate: Secondary | ICD-10-CM | POA: Diagnosis not present

## 2018-12-07 DIAGNOSIS — R5383 Other fatigue: Secondary | ICD-10-CM | POA: Diagnosis not present

## 2018-12-14 ENCOUNTER — Other Ambulatory Visit: Payer: Self-pay

## 2018-12-14 ENCOUNTER — Ambulatory Visit (INDEPENDENT_AMBULATORY_CARE_PROVIDER_SITE_OTHER): Payer: Medicare Other | Admitting: *Deleted

## 2018-12-14 DIAGNOSIS — I4821 Permanent atrial fibrillation: Secondary | ICD-10-CM | POA: Diagnosis not present

## 2018-12-14 DIAGNOSIS — Z5181 Encounter for therapeutic drug level monitoring: Secondary | ICD-10-CM

## 2018-12-14 LAB — POCT INR: INR: 2 (ref 2.0–3.0)

## 2018-12-14 NOTE — Patient Instructions (Signed)
Continue coumadin 1 1/2 tablets daily  Recheck in 6 weeks 

## 2018-12-16 DIAGNOSIS — I10 Essential (primary) hypertension: Secondary | ICD-10-CM | POA: Diagnosis not present

## 2018-12-16 DIAGNOSIS — I4891 Unspecified atrial fibrillation: Secondary | ICD-10-CM | POA: Diagnosis not present

## 2018-12-16 DIAGNOSIS — E119 Type 2 diabetes mellitus without complications: Secondary | ICD-10-CM | POA: Diagnosis not present

## 2019-01-02 DIAGNOSIS — Z23 Encounter for immunization: Secondary | ICD-10-CM | POA: Diagnosis not present

## 2019-01-13 DIAGNOSIS — I10 Essential (primary) hypertension: Secondary | ICD-10-CM | POA: Diagnosis not present

## 2019-01-13 DIAGNOSIS — I4891 Unspecified atrial fibrillation: Secondary | ICD-10-CM | POA: Diagnosis not present

## 2019-01-13 DIAGNOSIS — E119 Type 2 diabetes mellitus without complications: Secondary | ICD-10-CM | POA: Diagnosis not present

## 2019-01-25 ENCOUNTER — Other Ambulatory Visit: Payer: Self-pay

## 2019-01-25 ENCOUNTER — Ambulatory Visit (INDEPENDENT_AMBULATORY_CARE_PROVIDER_SITE_OTHER): Payer: Medicare Other | Admitting: *Deleted

## 2019-01-25 DIAGNOSIS — I482 Chronic atrial fibrillation, unspecified: Secondary | ICD-10-CM | POA: Diagnosis not present

## 2019-01-25 DIAGNOSIS — Z5181 Encounter for therapeutic drug level monitoring: Secondary | ICD-10-CM

## 2019-01-25 DIAGNOSIS — I4821 Permanent atrial fibrillation: Secondary | ICD-10-CM | POA: Diagnosis not present

## 2019-01-25 LAB — POCT INR: INR: 2.5 (ref 2.0–3.0)

## 2019-01-25 NOTE — Patient Instructions (Signed)
Continue coumadin 1 1/2 tablets daily  Recheck in 6 weeks 

## 2019-01-27 DIAGNOSIS — E1142 Type 2 diabetes mellitus with diabetic polyneuropathy: Secondary | ICD-10-CM | POA: Diagnosis not present

## 2019-01-27 DIAGNOSIS — L851 Acquired keratosis [keratoderma] palmaris et plantaris: Secondary | ICD-10-CM | POA: Diagnosis not present

## 2019-01-27 DIAGNOSIS — B351 Tinea unguium: Secondary | ICD-10-CM | POA: Diagnosis not present

## 2019-02-06 DIAGNOSIS — Z6838 Body mass index (BMI) 38.0-38.9, adult: Secondary | ICD-10-CM | POA: Diagnosis not present

## 2019-02-06 DIAGNOSIS — I4891 Unspecified atrial fibrillation: Secondary | ICD-10-CM | POA: Diagnosis not present

## 2019-02-06 DIAGNOSIS — Z299 Encounter for prophylactic measures, unspecified: Secondary | ICD-10-CM | POA: Diagnosis not present

## 2019-02-06 DIAGNOSIS — E1165 Type 2 diabetes mellitus with hyperglycemia: Secondary | ICD-10-CM | POA: Diagnosis not present

## 2019-02-06 DIAGNOSIS — I10 Essential (primary) hypertension: Secondary | ICD-10-CM | POA: Diagnosis not present

## 2019-02-06 DIAGNOSIS — E1142 Type 2 diabetes mellitus with diabetic polyneuropathy: Secondary | ICD-10-CM | POA: Diagnosis not present

## 2019-03-08 ENCOUNTER — Other Ambulatory Visit: Payer: Self-pay

## 2019-03-08 ENCOUNTER — Ambulatory Visit (INDEPENDENT_AMBULATORY_CARE_PROVIDER_SITE_OTHER): Payer: Medicare Other | Admitting: *Deleted

## 2019-03-08 DIAGNOSIS — I4821 Permanent atrial fibrillation: Secondary | ICD-10-CM

## 2019-03-08 DIAGNOSIS — Z5181 Encounter for therapeutic drug level monitoring: Secondary | ICD-10-CM | POA: Diagnosis not present

## 2019-03-08 LAB — POCT INR: INR: 2.8 (ref 2.0–3.0)

## 2019-03-08 NOTE — Patient Instructions (Signed)
Continue coumadin 1 1/2 tablets daily  Recheck in 6 weeks 

## 2019-03-15 DIAGNOSIS — I10 Essential (primary) hypertension: Secondary | ICD-10-CM | POA: Diagnosis not present

## 2019-03-15 DIAGNOSIS — E119 Type 2 diabetes mellitus without complications: Secondary | ICD-10-CM | POA: Diagnosis not present

## 2019-03-15 DIAGNOSIS — I4891 Unspecified atrial fibrillation: Secondary | ICD-10-CM | POA: Diagnosis not present

## 2019-04-19 ENCOUNTER — Ambulatory Visit (INDEPENDENT_AMBULATORY_CARE_PROVIDER_SITE_OTHER): Payer: Medicare Other | Admitting: *Deleted

## 2019-04-19 ENCOUNTER — Other Ambulatory Visit: Payer: Self-pay

## 2019-04-19 DIAGNOSIS — Z5181 Encounter for therapeutic drug level monitoring: Secondary | ICD-10-CM

## 2019-04-19 DIAGNOSIS — I4821 Permanent atrial fibrillation: Secondary | ICD-10-CM

## 2019-04-19 LAB — POCT INR: INR: 2.5 (ref 2.0–3.0)

## 2019-04-19 NOTE — Patient Instructions (Signed)
Continue coumadin 1 1/2 tablets daily  Recheck in 6 weeks 

## 2019-04-27 DIAGNOSIS — Z6841 Body Mass Index (BMI) 40.0 and over, adult: Secondary | ICD-10-CM | POA: Diagnosis not present

## 2019-04-27 DIAGNOSIS — Z789 Other specified health status: Secondary | ICD-10-CM | POA: Diagnosis not present

## 2019-04-27 DIAGNOSIS — I10 Essential (primary) hypertension: Secondary | ICD-10-CM | POA: Diagnosis not present

## 2019-04-27 DIAGNOSIS — E1165 Type 2 diabetes mellitus with hyperglycemia: Secondary | ICD-10-CM | POA: Diagnosis not present

## 2019-04-27 DIAGNOSIS — C9111 Chronic lymphocytic leukemia of B-cell type in remission: Secondary | ICD-10-CM | POA: Diagnosis not present

## 2019-04-27 DIAGNOSIS — I4891 Unspecified atrial fibrillation: Secondary | ICD-10-CM | POA: Diagnosis not present

## 2019-04-27 DIAGNOSIS — Z299 Encounter for prophylactic measures, unspecified: Secondary | ICD-10-CM | POA: Diagnosis not present

## 2019-05-01 DIAGNOSIS — Z23 Encounter for immunization: Secondary | ICD-10-CM | POA: Diagnosis not present

## 2019-05-12 DIAGNOSIS — E1142 Type 2 diabetes mellitus with diabetic polyneuropathy: Secondary | ICD-10-CM | POA: Diagnosis not present

## 2019-05-12 DIAGNOSIS — L851 Acquired keratosis [keratoderma] palmaris et plantaris: Secondary | ICD-10-CM | POA: Diagnosis not present

## 2019-05-12 DIAGNOSIS — B351 Tinea unguium: Secondary | ICD-10-CM | POA: Diagnosis not present

## 2019-05-23 DIAGNOSIS — I10 Essential (primary) hypertension: Secondary | ICD-10-CM | POA: Diagnosis not present

## 2019-05-23 DIAGNOSIS — E119 Type 2 diabetes mellitus without complications: Secondary | ICD-10-CM | POA: Diagnosis not present

## 2019-05-23 DIAGNOSIS — I4891 Unspecified atrial fibrillation: Secondary | ICD-10-CM | POA: Diagnosis not present

## 2019-05-25 ENCOUNTER — Telehealth: Payer: Self-pay | Admitting: Cardiology

## 2019-05-25 NOTE — Telephone Encounter (Signed)

## 2019-05-31 ENCOUNTER — Ambulatory Visit (INDEPENDENT_AMBULATORY_CARE_PROVIDER_SITE_OTHER): Payer: Medicare Other | Admitting: *Deleted

## 2019-05-31 ENCOUNTER — Other Ambulatory Visit: Payer: Self-pay

## 2019-05-31 DIAGNOSIS — I4821 Permanent atrial fibrillation: Secondary | ICD-10-CM | POA: Diagnosis not present

## 2019-05-31 DIAGNOSIS — Z5181 Encounter for therapeutic drug level monitoring: Secondary | ICD-10-CM

## 2019-05-31 DIAGNOSIS — Z23 Encounter for immunization: Secondary | ICD-10-CM | POA: Diagnosis not present

## 2019-05-31 LAB — POCT INR: INR: 2.7 (ref 2.0–3.0)

## 2019-05-31 NOTE — Patient Instructions (Signed)
Continue coumadin 1 1/2 tablets daily  Recheck in 6 weeks 

## 2019-06-01 ENCOUNTER — Telehealth: Payer: Medicare Other | Admitting: Cardiology

## 2019-06-02 DIAGNOSIS — C9111 Chronic lymphocytic leukemia of B-cell type in remission: Secondary | ICD-10-CM | POA: Diagnosis not present

## 2019-06-02 DIAGNOSIS — Z6841 Body Mass Index (BMI) 40.0 and over, adult: Secondary | ICD-10-CM | POA: Diagnosis not present

## 2019-06-02 DIAGNOSIS — E1142 Type 2 diabetes mellitus with diabetic polyneuropathy: Secondary | ICD-10-CM | POA: Diagnosis not present

## 2019-06-02 DIAGNOSIS — I4891 Unspecified atrial fibrillation: Secondary | ICD-10-CM | POA: Diagnosis not present

## 2019-06-02 DIAGNOSIS — I10 Essential (primary) hypertension: Secondary | ICD-10-CM | POA: Diagnosis not present

## 2019-06-02 DIAGNOSIS — E1165 Type 2 diabetes mellitus with hyperglycemia: Secondary | ICD-10-CM | POA: Diagnosis not present

## 2019-06-02 DIAGNOSIS — Z299 Encounter for prophylactic measures, unspecified: Secondary | ICD-10-CM | POA: Diagnosis not present

## 2019-06-19 DIAGNOSIS — I4891 Unspecified atrial fibrillation: Secondary | ICD-10-CM | POA: Diagnosis not present

## 2019-06-19 DIAGNOSIS — E119 Type 2 diabetes mellitus without complications: Secondary | ICD-10-CM | POA: Diagnosis not present

## 2019-06-19 DIAGNOSIS — I10 Essential (primary) hypertension: Secondary | ICD-10-CM | POA: Diagnosis not present

## 2019-06-20 ENCOUNTER — Telehealth (INDEPENDENT_AMBULATORY_CARE_PROVIDER_SITE_OTHER): Payer: Medicare Other | Admitting: Cardiology

## 2019-06-20 ENCOUNTER — Encounter: Payer: Self-pay | Admitting: *Deleted

## 2019-06-20 ENCOUNTER — Encounter: Payer: Self-pay | Admitting: Cardiology

## 2019-06-20 VITALS — Ht 72.0 in | Wt 274.0 lb

## 2019-06-20 DIAGNOSIS — I4821 Permanent atrial fibrillation: Secondary | ICD-10-CM

## 2019-06-20 DIAGNOSIS — R600 Localized edema: Secondary | ICD-10-CM

## 2019-06-20 DIAGNOSIS — I10 Essential (primary) hypertension: Secondary | ICD-10-CM

## 2019-06-20 NOTE — Patient Instructions (Addendum)

## 2019-06-20 NOTE — Progress Notes (Signed)
Virtual Visit via Telephone Note   This visit type was conducted due to national recommendations for restrictions regarding the COVID-19 Pandemic (e.g. social distancing) in an effort to limit this patient's exposure and mitigate transmission in our community.  Due to his co-morbid illnesses, this patient is at least at moderate risk for complications without adequate follow up.  This format is felt to be most appropriate for this patient at this time.  The patient did not have access to video technology/had technical difficulties with video requiring transitioning to audio format only (telephone).  All issues noted in this document were discussed and addressed.  No physical exam could be performed with this format.  Please refer to the patient's chart for his  consent to telehealth for [facility].   The patient was identified using 2 identifiers.  Date:  06/20/2019   ID:  [name] [name], DOB Mar 02, 1935, MRN [id number]  Patient Location: Home Provider Location: Office  PCP:  [name], MD  Cardiologist:  [name], MD Electrophysiologist:  None   Evaluation Performed:  Follow-Up Visit  Chief Complaint:  Cardiac follow-up  History of Present Illness:    [name] [name] is an 85 y.o. male last seen in July 2020.  We spoke by phone today.  He tells me that he has been doing very well overall.  He has already had the second dose of the coronavirus vaccine, still tends to stay around the house and does wear a mask when he goes out.  He does not report any progressive shortness of breath or leg swelling, no palpitations.  He remains on Coumadin with follow-up in the anticoagulation clinic.  Last INR was 2.7.  He denies any obvious bleeding problems or changes in stool.  He reports having recent lab work with Dr. Manuella [name], we are requesting the results for review.  I reviewed his medications which are outlined below.  Past Medical History:  Diagnosis Date  . Atrial fibrillation ([place])    . Chronic lymphocytic leukemia ([place])   . Coronary atherosclerosis of native coronary artery    Nonobstructive  . Essential hypertension   . Hyperlipidemia   . IgA nephropathy   . Type 2 diabetes mellitus ([place])   . UTI (urinary tract infection)    Past Surgical History:  Procedure Laterality Date  . AMPUTATION Left 09/15/2012   Procedure: PARTIAL AMPUTATION 3rd TOE LEFT FOOT;  Surgeon: Marcheta Grammes, DPM;  [facility];  Service: Orthopedics;  Laterality: Left;  . AMPUTATION Left 04/20/2013   Procedure: PARTIAL AMPUTATION 2ND TOE LEFT FOOT;  Surgeon: Marcheta Grammes, DPM;  [facility];  Service: Orthopedics;  Laterality: Left;  . ANAL FISSURE REPAIR    . APPLICATION OF WOUND VAC Right 09/09/2016   Procedure: APPLICATION OF WOUND VAC;  Surgeon: Caprice Beaver, DPM;  [facility]: [facility];  Service: Podiatry;  Laterality: Right;  . CATARACT EXTRACTION W/PHACO Right 09/30/2015   Procedure: CATARACT EXTRACTION PHACO AND INTRAOCULAR LENS PLACEMENT (IOC);  Surgeon: Tonny Branch, MD;  [facility]: [facility];  Service: Ophthalmology;  Laterality: Right;  CDE: 11.78  . CATARACT EXTRACTION W/PHACO Left 10/31/2015   Procedure: CATARACT EXTRACTION PHACO AND INTRAOCULAR LENS PLACEMENT LEFT EYE; CDE:  9.10;  Surgeon: Tonny Branch, MD;  [facility];  Service: Ophthalmology;  Laterality: Left;  . COLONOSCOPY  08/19/2011   Procedure: COLONOSCOPY;  Surgeon: Rogene Houston, MD;  [facility];  Service: Endoscopy;  Laterality: N/A;  930  . COLONOSCOPY N/A  09/19/2014   Procedure: COLONOSCOPY;  Surgeon: Rogene Houston, MD;  [facility];  Service: Endoscopy;  Laterality: N/A;  1055  . FEMORAL HERNIA REPAIR    . INCISION AND DRAINAGE Right 09/09/2016   Procedure: DEBRIDEMENT WOUND RT heel with wound vac attachment;  Surgeon: Caprice Beaver, DPM;  Location: [facility];  Service: Podiatry;  Laterality: Right;  . INCISION AND DRAINAGE ABSCESS Right 02/16/2018   Procedure:  INCISION AND DRAINAGE ABSCESS;  Surgeon: Leta Baptist, MD;  Location: [place];  Service: ENT;  Laterality: Right;  . INCISION AND DRAINAGE OF WOUND Right 08/12/2016   Procedure: DEBRIDEMENT WOUND RIGHT HEEL;  Surgeon: Caprice Beaver, DPM;  [facility];  Service: Podiatry;  Laterality: Right;  right heel  . MASS EXCISION Right 09/28/2017   Procedure: EXCISION OF EXTERNAL AUDITORY CANAL MASS;  Surgeon: Leta Baptist, MD;  Location: [place];  Service: ENT;  Laterality: Right;  . MASTOIDECTOMY Right 02/16/2018   Procedure: REVISION MASTOIDECTOMY;  Surgeon: Leta Baptist, MD;  Location: [facility];  Service: ENT;  Laterality: Right;  . Open repair left quadriceps tendon.  08/22/07   Dr. Aline Brochure  . Right total knee replacement    . TRANSURETHRAL RESECTION OF PROSTATE    . TYMPANOMASTOIDECTOMY Right 11/10/2017   Procedure: RIGHT TYMPANOMASTOIDECTOMY;  Surgeon: Leta Baptist, MD;  Location: [place];  Service: ENT;  Laterality: Right;  . WOUND EXPLORATION Right 08/12/2016   Procedure: EXPLORATION OF WOUND FOR FOREIGN BODY RIGHT HEEL;  Surgeon: Caprice Beaver, DPM;  [facility];  Service: Podiatry;  Laterality: Right;  right heel     Current Meds  Medication Sig  . Ascorbic Acid (VITAMIN C) 1000 MG tablet Take 1,000 mg by mouth daily with breakfast.   . atorvastatin (LIPITOR) 10 MG tablet Take 10 mg by mouth every evening.  . Calcium Carb-Cholecalciferol (CALCIUM 600+D) 600-800 MG-UNIT TABS Take 2 tablets by mouth daily with breakfast.  . cholecalciferol (VITAMIN D) 1000 units tablet Take 1,000 Units by mouth every morning.  . ciclopirox (LOPROX) 0.77 % cream Apply 1 application topically every morning. APPLY TO THE FEET  . Coenzyme Q10 (CO Q-10) 100 MG CAPS Take 100 mg by mouth daily with breakfast.   . digoxin (LANOXIN) 0.125 MG tablet Take 0.125 mg by mouth daily with breakfast.   . finasteride (PROSCAR) 5 MG tablet Take 5 mg by mouth every morning.   . furosemide (LASIX) 20  MG tablet Take 40 mg by mouth daily.   Marland Kitchen HUMALOG KWIKPEN 100 UNIT/ML KwikPen Inject 1-9 Units into the skin 3 (three) times daily. CBG 121 - 150: 1 unit,  CBG 151 - 200: 2 units,  CBG 201 - 250: 3 units,  CBG 251 - 300: 5 units,  CBG 301 - 350: 7 units,  CBG 351 - 400: 9 units  . Insulin Detemir (LEVEMIR FLEXTOUCH) 100 UNIT/ML Pen Inject 20 Units into the skin daily. (Patient taking differently: Inject 60 Units into the skin daily. )  . lisinopril (PRINIVIL,ZESTRIL) 20 MG tablet Take 40 mg by mouth daily with supper.  Marland Kitchen LORazepam (ATIVAN) 1 MG tablet Take 1 mg by mouth 2 (two) times daily as needed.  . Multiple Vitamin (MULTIVITAMIN WITH MINERALS) TABS tablet Take 1 tablet by mouth daily with breakfast.  . Omega-3 Fatty Acids (FISH OIL) 1200 MG CAPS Take 2 capsules by mouth 2 (two) times daily.  . pioglitazone (ACTOS) 15 MG tablet Take 7.5 mg by mouth every evening.   Marland Kitchen  polyethylene glycol (MIRALAX / GLYCOLAX) packet Take 17 g by mouth daily.  . potassium chloride SA (K-DUR,KLOR-CON) 20 MEQ tablet Take 40 mEq by mouth daily. Take starting 01/07/2018   . VICTOZA 18 MG/3ML SOPN Inject 1.8 mg into the skin daily with breakfast.  . vitamin E 400 UNIT capsule Take 400 Units by mouth every morning.  . warfarin (COUMADIN) 5 MG tablet Take 1 1/2 tablets (7.5mg ) daily or as directed by anticoagulation clinic  . [DISCONTINUED] insulin aspart (NOVOLOG FLEXPEN) 100 UNIT/ML FlexPen Inject 0-4 Units into the skin 3 (three) times daily with meals. CBG 121 - 150: 1 unit,  CBG 151 - 200: 2 units,  CBG 201 - 250: 3 units,  CBG 251 - 300: 5 units,  CBG 301 - 350: 7 units,  CBG 351 - 400: 9 units (Patient taking differently: Inject 0-4 Units into the skin 3 (three) times daily with meals. CBG 121 - 150: 1 unit,  CBG 151 - 200: 2 units,  CBG 201 - 250: 3 units,  CBG 251 - 300: 5 units,  CBG 301 - 350: 7 units,  CBG 351 - 400: 9 units HUMALOG)     Allergies:   Erythromycin and Penicillins   ROS:   Hearing loss.  Prior  CV studies:   The following studies were reviewed today:  Echocardiogram 08/04/2017: Study Conclusions  - Left ventricle: The cavity size was normal. Wall thickness was increased in a pattern of mild LVH. Systolic function was normal. The estimated ejection fraction was in the range of 60% to 65%. Wall motion was normal; there were no regional wall motion abnormalities. The study is not technically sufficient to allow evaluation of LV diastolic function. - Aortic valve: Mildly calcified annulus. Mildly thickened leaflets. There was mild stenosis. There was mild regurgitation. Mean gradient (S): 10 mm Hg. Valve area (VTI): 1.7 cm^2. Valve area (Vmax): 1.74 cm^2. Valve area (Vmean): 1.71 cm^2. - Mitral valve: Mildly calcified annulus. Mildly thickened leaflets. - Left atrium: The atrium was severely dilated. - Right ventricle: The cavity size was mildly dilated. - Right atrium: The atrium was moderately dilated. - Technically adequate study.  Labs/Other Tests and Data Reviewed:    EKG:  An ECG dated 02/15/2018 was personally reviewed today and demonstrated:  Atrial fibrillation with right bundle branch block and left anterior fascicular block.  Recent Labs:  February 2020: BUN 29, creatinine 0.88, potassium 4.3  Wt Readings from Last 3 Encounters:  06/20/19 274 lb (124.3 kg)  10/27/18 251 lb (113.9 kg)  05/09/18 242 lb (109.8 kg)     Objective:    Vital Signs:  Ht 6' (1.829 m)   Wt 274 lb (124.3 kg)   BMI 37.16 kg/m    Not able to obtain vital signs today. Patient spoke in full sentences, not obviously short of breath. No audible wheezing or coughing.  ASSESSMENT & PLAN:    1.  Permanent atrial fibrillation.  CHA2DS2-VASc or is 5.  He continues on Coumadin which he has tolerated long-term, PT/INR is followed by Dr. Manuella [name].  He reports no bleeding problems and is asymptomatic in terms of palpitations on Lanoxin.  2.  History of recurring lower leg edema,  presently stable by report on low-dose diuretics with potassium supplements.  Requesting recent lab work from Dr. Manuella [name].   Time:   Today, I have spent 7 minutes with the patient with telehealth technology discussing the above problems.     Medication Adjustments/Labs and Tests Ordered: Current medicines  are reviewed at length with the patient today.  Concerns regarding medicines are outlined above.   Tests Ordered: No orders of the defined types were placed in this encounter.   Medication Changes: No orders of the defined types were placed in this encounter.   Follow Up:  In Person 6 months in the [place] office.  Signed, [name], MD  06/20/2019 11:37 AM    [place]

## 2019-07-12 ENCOUNTER — Ambulatory Visit (INDEPENDENT_AMBULATORY_CARE_PROVIDER_SITE_OTHER): Payer: Medicare Other | Admitting: *Deleted

## 2019-07-12 ENCOUNTER — Other Ambulatory Visit: Payer: Self-pay

## 2019-07-12 DIAGNOSIS — I4821 Permanent atrial fibrillation: Secondary | ICD-10-CM | POA: Diagnosis not present

## 2019-07-12 DIAGNOSIS — Z5181 Encounter for therapeutic drug level monitoring: Secondary | ICD-10-CM

## 2019-07-12 LAB — POCT INR: INR: 1.8 — AB (ref 2.0–3.0)

## 2019-07-12 NOTE — Patient Instructions (Signed)
Take 2 tablets tonight and tomorrow then resume 1 1/2 tablets daily Recheck in 4 weeks

## 2019-07-21 DIAGNOSIS — B351 Tinea unguium: Secondary | ICD-10-CM | POA: Diagnosis not present

## 2019-07-21 DIAGNOSIS — L851 Acquired keratosis [keratoderma] palmaris et plantaris: Secondary | ICD-10-CM | POA: Diagnosis not present

## 2019-07-21 DIAGNOSIS — E1142 Type 2 diabetes mellitus with diabetic polyneuropathy: Secondary | ICD-10-CM | POA: Diagnosis not present

## 2019-07-31 DIAGNOSIS — H7011 Chronic mastoiditis, right ear: Secondary | ICD-10-CM | POA: Diagnosis not present

## 2019-07-31 DIAGNOSIS — H838X3 Other specified diseases of inner ear, bilateral: Secondary | ICD-10-CM | POA: Diagnosis not present

## 2019-07-31 DIAGNOSIS — H6121 Impacted cerumen, right ear: Secondary | ICD-10-CM | POA: Diagnosis not present

## 2019-07-31 DIAGNOSIS — H903 Sensorineural hearing loss, bilateral: Secondary | ICD-10-CM | POA: Diagnosis not present

## 2019-08-09 ENCOUNTER — Other Ambulatory Visit: Payer: Self-pay

## 2019-08-09 ENCOUNTER — Ambulatory Visit (INDEPENDENT_AMBULATORY_CARE_PROVIDER_SITE_OTHER): Payer: Medicare Other | Admitting: *Deleted

## 2019-08-09 DIAGNOSIS — Z5181 Encounter for therapeutic drug level monitoring: Secondary | ICD-10-CM | POA: Diagnosis not present

## 2019-08-09 DIAGNOSIS — I4821 Permanent atrial fibrillation: Secondary | ICD-10-CM

## 2019-08-09 LAB — POCT INR: INR: 2.6 (ref 2.0–3.0)

## 2019-08-09 NOTE — Patient Instructions (Signed)
Continue warfarin 1 1/2  tablets daily Recheck in 4 weeks 

## 2019-09-01 DIAGNOSIS — I10 Essential (primary) hypertension: Secondary | ICD-10-CM | POA: Diagnosis not present

## 2019-09-01 DIAGNOSIS — C9111 Chronic lymphocytic leukemia of B-cell type in remission: Secondary | ICD-10-CM | POA: Diagnosis not present

## 2019-09-01 DIAGNOSIS — E1165 Type 2 diabetes mellitus with hyperglycemia: Secondary | ICD-10-CM | POA: Diagnosis not present

## 2019-09-01 DIAGNOSIS — Z299 Encounter for prophylactic measures, unspecified: Secondary | ICD-10-CM | POA: Diagnosis not present

## 2019-09-01 DIAGNOSIS — I4891 Unspecified atrial fibrillation: Secondary | ICD-10-CM | POA: Diagnosis not present

## 2019-09-01 DIAGNOSIS — E1142 Type 2 diabetes mellitus with diabetic polyneuropathy: Secondary | ICD-10-CM | POA: Diagnosis not present

## 2019-09-03 DIAGNOSIS — E119 Type 2 diabetes mellitus without complications: Secondary | ICD-10-CM | POA: Diagnosis not present

## 2019-09-03 DIAGNOSIS — I4891 Unspecified atrial fibrillation: Secondary | ICD-10-CM | POA: Diagnosis not present

## 2019-09-03 DIAGNOSIS — I10 Essential (primary) hypertension: Secondary | ICD-10-CM | POA: Diagnosis not present

## 2019-09-06 ENCOUNTER — Ambulatory Visit (INDEPENDENT_AMBULATORY_CARE_PROVIDER_SITE_OTHER): Payer: Medicare Other | Admitting: *Deleted

## 2019-09-06 ENCOUNTER — Other Ambulatory Visit: Payer: Self-pay

## 2019-09-06 DIAGNOSIS — I4821 Permanent atrial fibrillation: Secondary | ICD-10-CM | POA: Diagnosis not present

## 2019-09-06 DIAGNOSIS — Z5181 Encounter for therapeutic drug level monitoring: Secondary | ICD-10-CM

## 2019-09-06 LAB — POCT INR: INR: 3.3 — AB (ref 2.0–3.0)

## 2019-09-06 NOTE — Patient Instructions (Signed)
Take 1/2 tablet tonight then resume 1 1/2 tablets daily Recheck in 4 weeks

## 2019-09-28 DIAGNOSIS — G510 Bell's palsy: Secondary | ICD-10-CM | POA: Diagnosis not present

## 2019-09-28 DIAGNOSIS — Z794 Long term (current) use of insulin: Secondary | ICD-10-CM | POA: Diagnosis not present

## 2019-09-28 DIAGNOSIS — E119 Type 2 diabetes mellitus without complications: Secondary | ICD-10-CM | POA: Diagnosis not present

## 2019-09-28 DIAGNOSIS — Z7984 Long term (current) use of oral hypoglycemic drugs: Secondary | ICD-10-CM | POA: Diagnosis not present

## 2019-09-28 DIAGNOSIS — Z961 Presence of intraocular lens: Secondary | ICD-10-CM | POA: Diagnosis not present

## 2019-09-28 DIAGNOSIS — H524 Presbyopia: Secondary | ICD-10-CM | POA: Diagnosis not present

## 2019-10-04 ENCOUNTER — Ambulatory Visit (INDEPENDENT_AMBULATORY_CARE_PROVIDER_SITE_OTHER): Payer: Medicare Other | Admitting: *Deleted

## 2019-10-04 DIAGNOSIS — I4821 Permanent atrial fibrillation: Secondary | ICD-10-CM | POA: Diagnosis not present

## 2019-10-04 DIAGNOSIS — I4891 Unspecified atrial fibrillation: Secondary | ICD-10-CM | POA: Diagnosis not present

## 2019-10-04 DIAGNOSIS — I10 Essential (primary) hypertension: Secondary | ICD-10-CM | POA: Diagnosis not present

## 2019-10-04 DIAGNOSIS — I482 Chronic atrial fibrillation, unspecified: Secondary | ICD-10-CM | POA: Diagnosis not present

## 2019-10-04 DIAGNOSIS — Z5181 Encounter for therapeutic drug level monitoring: Secondary | ICD-10-CM

## 2019-10-04 DIAGNOSIS — E119 Type 2 diabetes mellitus without complications: Secondary | ICD-10-CM | POA: Diagnosis not present

## 2019-10-04 LAB — POCT INR: INR: 3.4 — AB (ref 2.0–3.0)

## 2019-10-04 NOTE — Patient Instructions (Signed)
Hold warfarin tonight then decrease dose to 1 1/2 tablets daily except 1 tablet on Wednesdays and Saturdays Recheck in 4 weeks

## 2019-10-25 ENCOUNTER — Ambulatory Visit (INDEPENDENT_AMBULATORY_CARE_PROVIDER_SITE_OTHER): Payer: Medicare Other | Admitting: *Deleted

## 2019-10-25 DIAGNOSIS — I4821 Permanent atrial fibrillation: Secondary | ICD-10-CM | POA: Diagnosis not present

## 2019-10-25 DIAGNOSIS — Z5181 Encounter for therapeutic drug level monitoring: Secondary | ICD-10-CM

## 2019-10-25 LAB — POCT INR: INR: 2.7 (ref 2.0–3.0)

## 2019-10-25 NOTE — Patient Instructions (Signed)
Continue warfarin 1 1/2 tablets daily except 1 tablet on Wednesdays and Saturdays Recheck in 4 weeks 

## 2019-11-03 DIAGNOSIS — I4891 Unspecified atrial fibrillation: Secondary | ICD-10-CM | POA: Diagnosis not present

## 2019-11-03 DIAGNOSIS — E119 Type 2 diabetes mellitus without complications: Secondary | ICD-10-CM | POA: Diagnosis not present

## 2019-11-03 DIAGNOSIS — I10 Essential (primary) hypertension: Secondary | ICD-10-CM | POA: Diagnosis not present

## 2019-11-07 DIAGNOSIS — E119 Type 2 diabetes mellitus without complications: Secondary | ICD-10-CM | POA: Diagnosis not present

## 2019-11-07 DIAGNOSIS — I4891 Unspecified atrial fibrillation: Secondary | ICD-10-CM | POA: Diagnosis not present

## 2019-11-07 DIAGNOSIS — I10 Essential (primary) hypertension: Secondary | ICD-10-CM | POA: Diagnosis not present

## 2019-11-10 DIAGNOSIS — N401 Enlarged prostate with lower urinary tract symptoms: Secondary | ICD-10-CM | POA: Diagnosis not present

## 2019-11-10 DIAGNOSIS — R3914 Feeling of incomplete bladder emptying: Secondary | ICD-10-CM | POA: Diagnosis not present

## 2019-11-22 ENCOUNTER — Ambulatory Visit (INDEPENDENT_AMBULATORY_CARE_PROVIDER_SITE_OTHER): Payer: Medicare Other | Admitting: *Deleted

## 2019-11-22 DIAGNOSIS — I4821 Permanent atrial fibrillation: Secondary | ICD-10-CM | POA: Diagnosis not present

## 2019-11-22 DIAGNOSIS — Z5181 Encounter for therapeutic drug level monitoring: Secondary | ICD-10-CM | POA: Diagnosis not present

## 2019-11-22 LAB — POCT INR: INR: 2.5 (ref 2.0–3.0)

## 2019-11-22 NOTE — Patient Instructions (Signed)
Continue warfarin 1 1/2 tablets daily except 1 tablet on Wednesdays and Saturdays Recheck in 4 weeks

## 2019-11-24 ENCOUNTER — Other Ambulatory Visit: Payer: Self-pay | Admitting: Cardiology

## 2019-12-08 DIAGNOSIS — E1142 Type 2 diabetes mellitus with diabetic polyneuropathy: Secondary | ICD-10-CM | POA: Diagnosis not present

## 2019-12-08 DIAGNOSIS — B351 Tinea unguium: Secondary | ICD-10-CM | POA: Diagnosis not present

## 2019-12-08 DIAGNOSIS — L851 Acquired keratosis [keratoderma] palmaris et plantaris: Secondary | ICD-10-CM | POA: Diagnosis not present

## 2019-12-21 ENCOUNTER — Encounter: Payer: Self-pay | Admitting: *Deleted

## 2019-12-21 ENCOUNTER — Ambulatory Visit (INDEPENDENT_AMBULATORY_CARE_PROVIDER_SITE_OTHER): Payer: Medicare Other | Admitting: *Deleted

## 2019-12-21 ENCOUNTER — Ambulatory Visit (INDEPENDENT_AMBULATORY_CARE_PROVIDER_SITE_OTHER): Payer: Medicare Other | Admitting: Cardiology

## 2019-12-21 ENCOUNTER — Encounter: Payer: Self-pay | Admitting: Cardiology

## 2019-12-21 ENCOUNTER — Other Ambulatory Visit: Payer: Self-pay

## 2019-12-21 VITALS — BP 128/60 | HR 64 | Ht 72.0 in | Wt 264.0 lb

## 2019-12-21 DIAGNOSIS — I4821 Permanent atrial fibrillation: Secondary | ICD-10-CM | POA: Diagnosis not present

## 2019-12-21 DIAGNOSIS — I350 Nonrheumatic aortic (valve) stenosis: Secondary | ICD-10-CM | POA: Diagnosis not present

## 2019-12-21 DIAGNOSIS — Z5181 Encounter for therapeutic drug level monitoring: Secondary | ICD-10-CM | POA: Diagnosis not present

## 2019-12-21 LAB — POCT INR: INR: 2.9 (ref 2.0–3.0)

## 2019-12-21 NOTE — Patient Instructions (Signed)
Continue warfarin 1 1/2 tablets daily except 1 tablet on Wednesdays and Saturdays Recheck in 6 weeks

## 2019-12-21 NOTE — Patient Instructions (Signed)

## 2019-12-21 NOTE — Progress Notes (Signed)
Cardiology Office Note  Date: 12/21/2019   ID: MERRIL NAGY, DOB 1935/01/26, MRN [id number]  PCP:  [name], MD  Cardiologist:  [name], MD Electrophysiologist:  None   Chief Complaint  Patient presents with  . Cardiac follow-up    History of Present Illness: [name] [name] is an 85 y.o. male last assessed via telehealth encounter in March.  He is here today for a routine visit.  He does not report any sense of palpitations or chest pain.  States that he has been working outside, took down his outdoor shutters to sand and paint.  I reviewed his medications which are outlined below.  He does not report any intolerances.  I personally reviewed his ECG today which shows rate controlled atrial fibrillation with right bundle branch block and left anterior fascicular block with increased voltage.  He continues on Coumadin with follow-up in the anticoagulation clinic.  INR was 2.9 today.  He does not report any spontaneous bleeding problems.  Plans to have further lab work with PCP at next wellness check.  Past Medical History:  Diagnosis Date  . Atrial fibrillation ([place])   . Chronic lymphocytic leukemia ([place])   . Coronary atherosclerosis of native coronary artery    Nonobstructive  . Essential hypertension   . Hyperlipidemia   . IgA nephropathy   . Type 2 diabetes mellitus ([place])   . UTI (urinary tract infection)     Past Surgical History:  Procedure Laterality Date  . AMPUTATION Left 09/15/2012   Procedure: PARTIAL AMPUTATION 3rd TOE LEFT FOOT;  Surgeon: Marcheta Grammes, DPM;  Location: [facility];  Service: Orthopedics;  Laterality: Left;  . AMPUTATION Left 04/20/2013   Procedure: PARTIAL AMPUTATION 2ND TOE LEFT FOOT;  Surgeon: Marcheta Grammes, DPM;  Location: [facility];  Service: Orthopedics;  Laterality: Left;  . ANAL FISSURE REPAIR    . APPLICATION OF WOUND VAC Right 09/09/2016   Procedure: APPLICATION OF WOUND VAC;  Surgeon: Caprice Beaver, DPM;   Location: [facility];  Service: Podiatry;  Laterality: Right;  . CATARACT EXTRACTION W/PHACO Right 09/30/2015   Procedure: CATARACT EXTRACTION PHACO AND INTRAOCULAR LENS PLACEMENT (IOC);  Surgeon: Tonny Branch, MD;  Location: [facility];  Service: Ophthalmology;  Laterality: Right;  CDE: 11.78  . CATARACT EXTRACTION W/PHACO Left 10/31/2015   Procedure: CATARACT EXTRACTION PHACO AND INTRAOCULAR LENS PLACEMENT LEFT EYE; CDE:  9.10;  Surgeon: Tonny Branch, MD;  Location: [facility];  Service: Ophthalmology;  Laterality: Left;  . COLONOSCOPY  08/19/2011   Procedure: COLONOSCOPY;  Surgeon: Rogene Houston, MD;  [facility];  Service: Endoscopy;  Laterality: N/A;  930  . COLONOSCOPY N/A 09/19/2014   Procedure: COLONOSCOPY;  Surgeon: Rogene Houston, MD;  [facility];  Service: Endoscopy;  Laterality: N/A;  1055  . FEMORAL HERNIA REPAIR    . INCISION AND DRAINAGE Right 09/09/2016   Procedure: DEBRIDEMENT WOUND RT heel with wound vac attachment;  Surgeon: Caprice Beaver, DPM;  Location: [facility];  Service: Podiatry;  Laterality: Right;  . INCISION AND DRAINAGE ABSCESS Right 02/16/2018   Procedure: INCISION AND DRAINAGE ABSCESS;  Surgeon: Leta Baptist, MD;  Location: [facility];  Service: ENT;  Laterality: Right;  . INCISION AND DRAINAGE OF WOUND Right 08/12/2016   Procedure: DEBRIDEMENT WOUND RIGHT HEEL;  Surgeon: Caprice Beaver, DPM;  Location: [facility];  Service: Podiatry;  Laterality: Right;  right heel  . MASS EXCISION Right 09/28/2017   Procedure: EXCISION OF EXTERNAL AUDITORY  CANAL MASS;  Surgeon: Leta Baptist, MD;  Location: [facility];  Service: ENT;  Laterality: Right;  . MASTOIDECTOMY Right 02/16/2018   Procedure: REVISION MASTOIDECTOMY;  Surgeon: Leta Baptist, MD;  Location[facility];  Service: ENT;  Laterality: Right;  . Open repair left quadriceps tendon.  08/22/07   Dr. Aline Brochure  . Right total knee replacement    . TRANSURETHRAL RESECTION OF PROSTATE    . TYMPANOMASTOIDECTOMY Right  11/10/2017   Procedure: RIGHT TYMPANOMASTOIDECTOMY;  Surgeon: Leta Baptist, MD;  Location: [facility];  Service: ENT;  Laterality: Right;  . WOUND EXPLORATION Right 08/12/2016   Procedure: EXPLORATION OF WOUND FOR FOREIGN BODY RIGHT HEEL;  Surgeon: Caprice Beaver, DPM;  Location: [facility];  Service: Podiatry;  Laterality: Right;  right heel    Current Outpatient Medications  Medication Sig Dispense Refill  . amLODipine (NORVASC) 10 MG tablet Take 10 mg by mouth daily.    . Ascorbic Acid (VITAMIN C) 1000 MG tablet Take 1,000 mg by mouth daily with breakfast.     . atorvastatin (LIPITOR) 10 MG tablet Take 10 mg by mouth every evening.    . Calcium Carb-Cholecalciferol (CALCIUM 600+D) 600-800 MG-UNIT TABS Take 2 tablets by mouth daily with breakfast.    . cholecalciferol (VITAMIN D) 1000 units tablet Take 1,000 Units by mouth every morning.    . ciclopirox (LOPROX) 0.77 % cream Apply 1 application topically every morning. APPLY TO THE FEET    . Coenzyme Q10 (CO Q-10) 100 MG CAPS Take 100 mg by mouth daily with breakfast.     . digoxin (LANOXIN) 0.125 MG tablet Take 0.125 mg by mouth daily with breakfast.     . finasteride (PROSCAR) 5 MG tablet Take 5 mg by mouth every morning.     . furosemide (LASIX) 20 MG tablet Take 40 mg by mouth daily.     Marland Kitchen HUMALOG KWIKPEN 100 UNIT/ML KwikPen Inject 1-9 Units into the skin 3 (three) times daily. CBG 121 - 150: 1 unit,  CBG 151 - 200: 2 units,  CBG 201 - 250: 3 units,  CBG 251 - 300: 5 units,  CBG 301 - 350: 7 units,  CBG 351 - 400: 9 units    . Insulin Detemir (LEVEMIR FLEXTOUCH) 100 UNIT/ML Pen Inject 20 Units into the skin daily. (Patient taking differently: Inject 60 Units into the skin daily. )    . lisinopril (PRINIVIL,ZESTRIL) 20 MG tablet Take 40 mg by mouth daily with supper.    Marland Kitchen LORazepam (ATIVAN) 1 MG tablet Take 1 mg by mouth 2 (two) times daily as needed.    . Multiple Vitamin (MULTIVITAMIN WITH MINERALS) TABS tablet Take 1 tablet by  mouth daily with breakfast.    . Omega-3 Fatty Acids (FISH OIL) 1200 MG CAPS Take 2 capsules by mouth 2 (two) times daily.    . pioglitazone (ACTOS) 15 MG tablet Take 7.5 mg by mouth every evening.     . polyethylene glycol (MIRALAX / GLYCOLAX) packet Take 17 g by mouth daily.    . potassium chloride SA (K-DUR,KLOR-CON) 20 MEQ tablet Take 40 mEq by mouth daily. Take starting 01/07/2018     . VICTOZA 18 MG/3ML SOPN Inject 1.8 mg into the skin daily with breakfast.    . vitamin E 400 UNIT capsule Take 400 Units by mouth every morning.    . warfarin (COUMADIN) 5 MG tablet TAKE 1 & 1/2 (ONE & ONE-HALF) TABLETS BY MOUTH ONCE DAILY OR  AS  DIRECTED  BY  ANTICOAGULATION  CLINIC 135 tablet 0   No current facility-administered medications for this visit.   Allergies:  Erythromycin and Penicillins   ROS:  Chronic hearing loss.  Physical Exam: VS:  BP 128/60   Pulse 64   Ht 6' (1.829 m)   Wt 264 lb (119.7 kg)   SpO2 95%   BMI 35.80 kg/m , BMI Body mass index is 35.8 kg/m.  Wt Readings from Last 3 Encounters:  12/21/19 264 lb (119.7 kg)  06/20/19 274 lb (124.3 kg)  10/27/18 251 lb (113.9 kg)    General: Patient appears comfortable at rest. HEENT: Conjunctiva and lids normal, wearing a mask. Neck: Supple, no elevated JVP or carotid bruits, no thyromegaly. Lungs: Clear to auscultation, nonlabored breathing at rest. Cardiac: Irregularly irregular, no gallop. Abdomen: Soft, nontender, bowel sounds present. Extremities: Mild ankle edema.  ECG:  An ECG dated 02/15/2018 was personally reviewed today and demonstrated:  Atrial fibrillation with right bundle branch block and left anterior fascicular block.  Recent Labwork:  September 2020: Hemoglobin 13.1, platelets 133, TSH 1.94, cholesterol 117, triglycerides 66, HDL 43, LDL 60, BUN 31, creatinine 0.80, potassium 4.0, AST 23, ALT 28 May 2019: Hemoglobin A1c 5.9%  Other Studies Reviewed Today:  Echocardiogram 08/04/2017: Study  Conclusions  - Left ventricle: The cavity size was normal. Wall thickness was increased in a pattern of mild LVH. Systolic function was normal. The estimated ejection fraction was in the range of 60% to 65%. Wall motion was normal; there were no regional wall motion abnormalities. The study is not technically sufficient to allow evaluation of LV diastolic function. - Aortic valve: Mildly calcified annulus. Mildly thickened leaflets. There was mild stenosis. There was mild regurgitation. Mean gradient (S): 10 mm Hg. Valve area (VTI): 1.7 cm^2. Valve area (Vmax): 1.74 cm^2. Valve area (Vmean): 1.71 cm^2. - Mitral valve: Mildly calcified annulus. Mildly thickened leaflets. - Left atrium: The atrium was severely dilated. - Right ventricle: The cavity size was mildly dilated. - Right atrium: The atrium was moderately dilated. - Technically adequate study.  Assessment and Plan:  1.  Permanent atrial fibrillation with CHA2DS2-VASc score of 5.  He reports no active sense of palpitations and heart rate is controlled by ECG.  He continues on Coumadin for stroke prophylaxis, INR 2.9 and no reported spontaneous bleeding problems.  Continue low-dose Lanoxin which he has tolerated well.  2.  Mild aortic stenosis, cardiac murmur stable on examination.  Last echocardiogram was in 2019.  Medication Adjustments/Labs and Tests Ordered: Current medicines are reviewed at length with the patient today.  Concerns regarding medicines are outlined above.   Tests Ordered: Orders Placed This Encounter  Procedures  . EKG 12-Lead    Medication Changes: No orders of the defined types were placed in this encounter.   Disposition:  Follow up 6 months in the [facility] office.  Signed, Satira Sark, MD, [facility] 12/21/2019 2:46 PM    [facility] [address], [place], [place] [address] Phone: [phone]; Fax: [phone]

## 2019-12-25 DIAGNOSIS — L0100 Impetigo, unspecified: Secondary | ICD-10-CM | POA: Diagnosis not present

## 2019-12-29 DIAGNOSIS — E1165 Type 2 diabetes mellitus with hyperglycemia: Secondary | ICD-10-CM | POA: Diagnosis not present

## 2019-12-29 DIAGNOSIS — Z0000 Encounter for general adult medical examination without abnormal findings: Secondary | ICD-10-CM | POA: Diagnosis not present

## 2019-12-29 DIAGNOSIS — Z79899 Other long term (current) drug therapy: Secondary | ICD-10-CM | POA: Diagnosis not present

## 2019-12-29 DIAGNOSIS — Z1339 Encounter for screening examination for other mental health and behavioral disorders: Secondary | ICD-10-CM | POA: Diagnosis not present

## 2019-12-29 DIAGNOSIS — Z6838 Body mass index (BMI) 38.0-38.9, adult: Secondary | ICD-10-CM | POA: Diagnosis not present

## 2019-12-29 DIAGNOSIS — Z125 Encounter for screening for malignant neoplasm of prostate: Secondary | ICD-10-CM | POA: Diagnosis not present

## 2019-12-29 DIAGNOSIS — E7800 Pure hypercholesterolemia, unspecified: Secondary | ICD-10-CM | POA: Diagnosis not present

## 2019-12-29 DIAGNOSIS — Z7189 Other specified counseling: Secondary | ICD-10-CM | POA: Diagnosis not present

## 2019-12-29 DIAGNOSIS — R5383 Other fatigue: Secondary | ICD-10-CM | POA: Diagnosis not present

## 2019-12-29 DIAGNOSIS — C9111 Chronic lymphocytic leukemia of B-cell type in remission: Secondary | ICD-10-CM | POA: Diagnosis not present

## 2019-12-29 DIAGNOSIS — Z299 Encounter for prophylactic measures, unspecified: Secondary | ICD-10-CM | POA: Diagnosis not present

## 2019-12-29 DIAGNOSIS — Z1331 Encounter for screening for depression: Secondary | ICD-10-CM | POA: Diagnosis not present

## 2020-01-04 DIAGNOSIS — I10 Essential (primary) hypertension: Secondary | ICD-10-CM | POA: Diagnosis not present

## 2020-01-04 DIAGNOSIS — E119 Type 2 diabetes mellitus without complications: Secondary | ICD-10-CM | POA: Diagnosis not present

## 2020-01-04 DIAGNOSIS — I4891 Unspecified atrial fibrillation: Secondary | ICD-10-CM | POA: Diagnosis not present

## 2020-01-08 DIAGNOSIS — L0100 Impetigo, unspecified: Secondary | ICD-10-CM | POA: Diagnosis not present

## 2020-01-08 DIAGNOSIS — L304 Erythema intertrigo: Secondary | ICD-10-CM | POA: Diagnosis not present

## 2020-01-10 ENCOUNTER — Ambulatory Visit (INDEPENDENT_AMBULATORY_CARE_PROVIDER_SITE_OTHER): Payer: Medicare Other | Admitting: *Deleted

## 2020-01-10 DIAGNOSIS — Z5181 Encounter for therapeutic drug level monitoring: Secondary | ICD-10-CM

## 2020-01-10 DIAGNOSIS — S61412A Laceration without foreign body of left hand, initial encounter: Secondary | ICD-10-CM | POA: Diagnosis not present

## 2020-01-10 DIAGNOSIS — I4821 Permanent atrial fibrillation: Secondary | ICD-10-CM | POA: Diagnosis not present

## 2020-01-10 LAB — POCT INR: INR: 3 (ref 2.0–3.0)

## 2020-01-10 NOTE — Patient Instructions (Signed)
Hold warfarin tonight (due to skin tear on arm) then resume 1 1/2 tablets daily except 1 tablet on Wednesdays and Saturdays Recheck on 10/28 as scheduled

## 2020-01-14 DIAGNOSIS — Z23 Encounter for immunization: Secondary | ICD-10-CM | POA: Diagnosis not present

## 2020-01-22 DIAGNOSIS — L0100 Impetigo, unspecified: Secondary | ICD-10-CM | POA: Diagnosis not present

## 2020-01-30 DIAGNOSIS — H838X3 Other specified diseases of inner ear, bilateral: Secondary | ICD-10-CM | POA: Diagnosis not present

## 2020-01-30 DIAGNOSIS — H903 Sensorineural hearing loss, bilateral: Secondary | ICD-10-CM | POA: Diagnosis not present

## 2020-01-30 DIAGNOSIS — H7011 Chronic mastoiditis, right ear: Secondary | ICD-10-CM | POA: Diagnosis not present

## 2020-02-01 ENCOUNTER — Ambulatory Visit (INDEPENDENT_AMBULATORY_CARE_PROVIDER_SITE_OTHER): Payer: Medicare Other | Admitting: *Deleted

## 2020-02-01 DIAGNOSIS — Z5181 Encounter for therapeutic drug level monitoring: Secondary | ICD-10-CM

## 2020-02-01 DIAGNOSIS — I4821 Permanent atrial fibrillation: Secondary | ICD-10-CM

## 2020-02-01 LAB — POCT INR: INR: 3.7 — AB (ref 2.0–3.0)

## 2020-02-01 NOTE — Patient Instructions (Signed)
Hold warfarin tonight (on doxycycline qd for naval sore) then resume 1 1/2 tablets daily except 1 tablet on Wednesdays and Saturdays Recheck on 02/15/20  Has a f/u appt Monday with Dr Tarri Glenn.   Pt will call back if he continues Doxycycline

## 2020-02-02 DIAGNOSIS — I4891 Unspecified atrial fibrillation: Secondary | ICD-10-CM | POA: Diagnosis not present

## 2020-02-02 DIAGNOSIS — E119 Type 2 diabetes mellitus without complications: Secondary | ICD-10-CM | POA: Diagnosis not present

## 2020-02-02 DIAGNOSIS — I10 Essential (primary) hypertension: Secondary | ICD-10-CM | POA: Diagnosis not present

## 2020-02-05 DIAGNOSIS — K429 Umbilical hernia without obstruction or gangrene: Secondary | ICD-10-CM | POA: Diagnosis not present

## 2020-02-05 DIAGNOSIS — D239 Other benign neoplasm of skin, unspecified: Secondary | ICD-10-CM | POA: Diagnosis not present

## 2020-02-07 IMAGING — MR MR HEAD WO/W CM
7 of 16 series · 25 of 48 positions shown · IV contrast (multihance)
Comparison: CT temporal bones October 16, 2017

CLINICAL DATA: RIGHT external auditory canal abscess drainage.
RIGHT facial droop, history of Bell's palsy. History of
hypertension, hyperlipidemia, diabetes, leukemia.

EXAM:
MRI HEAD WITHOUT AND WITH CONTRAST
TECHNIQUE: Multiplanar, multiecho pulse sequences of the brain and surrounding
structures were obtained without and with intravenous contrast.
CONTRAST:  20mL MULTIHANCE GADOBENATE DIMEGLUMINE 529 MG/ML IV SOLN

[Series 3: DWI · axial · 3.0mm · 0.73mm/px · z∈[-91,+69]mm · 3 of 55 slices shown (1 of 4)]
[im 1/55]
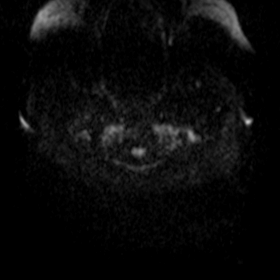
[im 28/55]
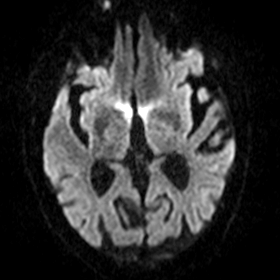
[im 55/55]
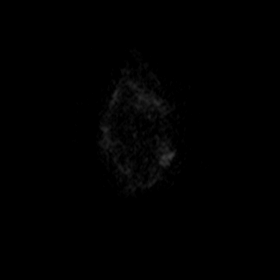

[Series 4: DWI · axial · 3.0mm · 0.76mm/px · z∈[-91,+70]mm · 4 of 55 slices shown (2 of 4)]
[im 1/55]
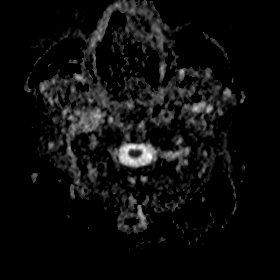
[im 19/55]
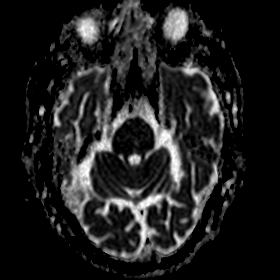
[im 37/55]
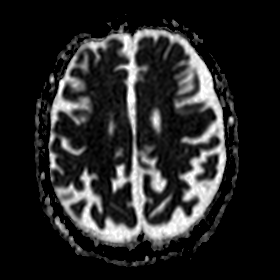
[im 55/55]
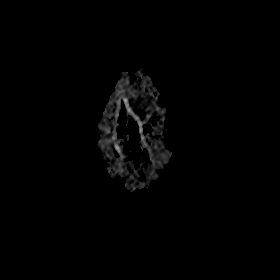

[Series 5: DWI · coronal · 5.0mm · 0.51mm/px · 3 of 34 slices shown (3 of 4)]
[im 1/34]
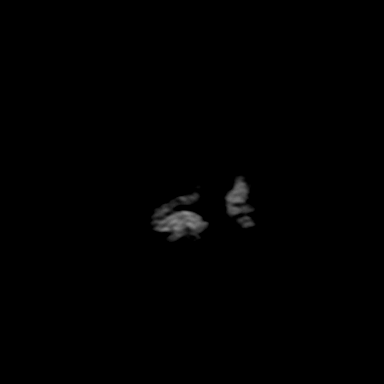
[im 17/34]
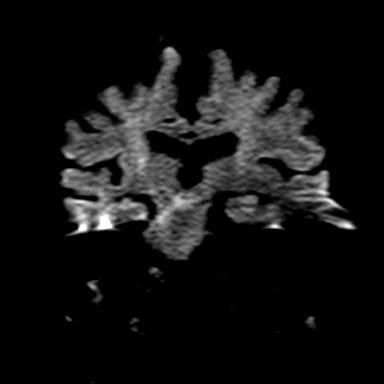
[im 34/34]
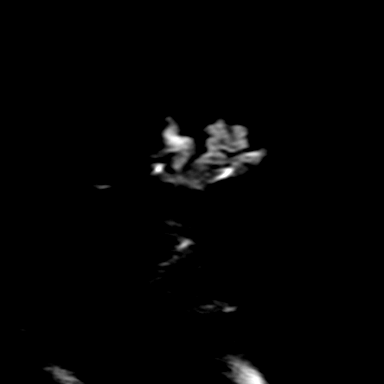

[Series 6: DWI · coronal · 5.0mm · 0.52mm/px · 3 of 34 slices shown (4 of 4)]
[im 1/34]
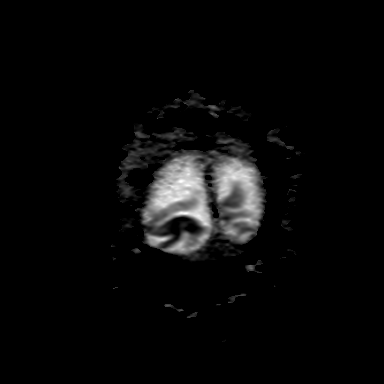
[im 17/34]
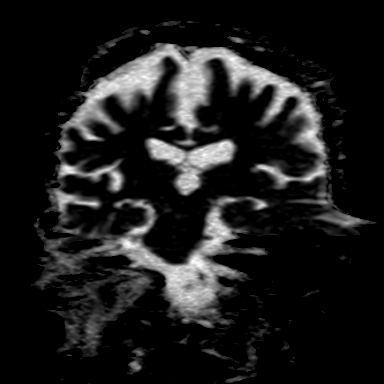
[im 34/34]
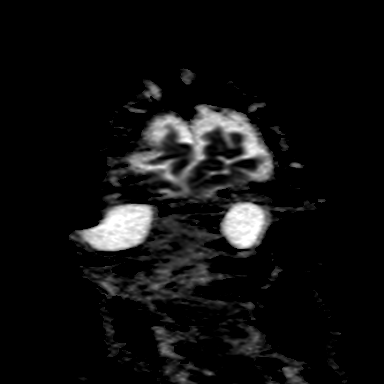

[Series 7: T2 · axial · 5.0mm · 0.44mm/px · z∈[-70,+71]mm · 2 of 23 slices shown]
[im 1/23]
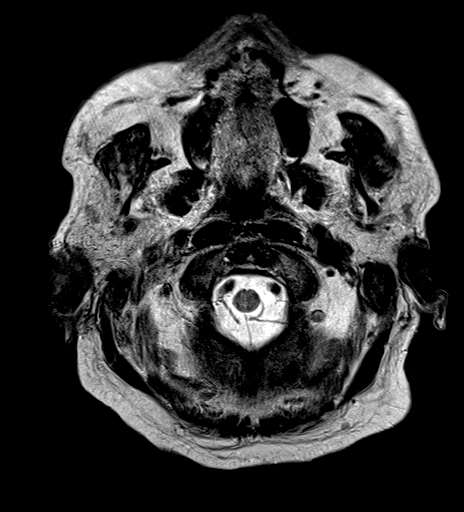
[im 23/23]
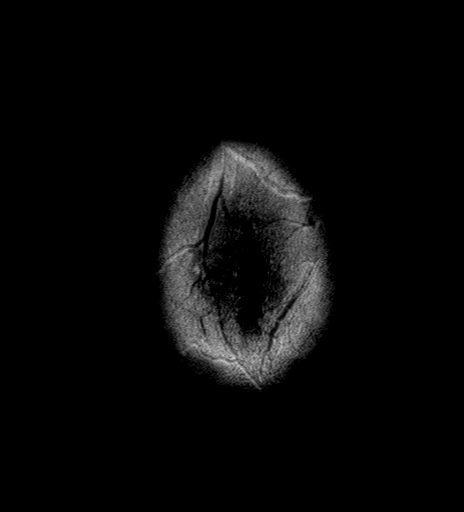

[Series 9: FLAIR · axial · 3.0mm · 0.35mm/px · z∈[-71,+71]mm · 4 of 49 slices shown]
[im 1/49]
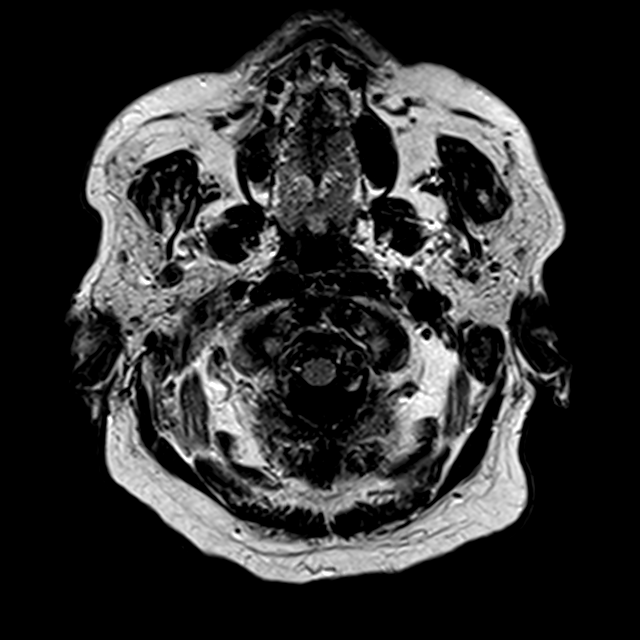
[im 17/49]
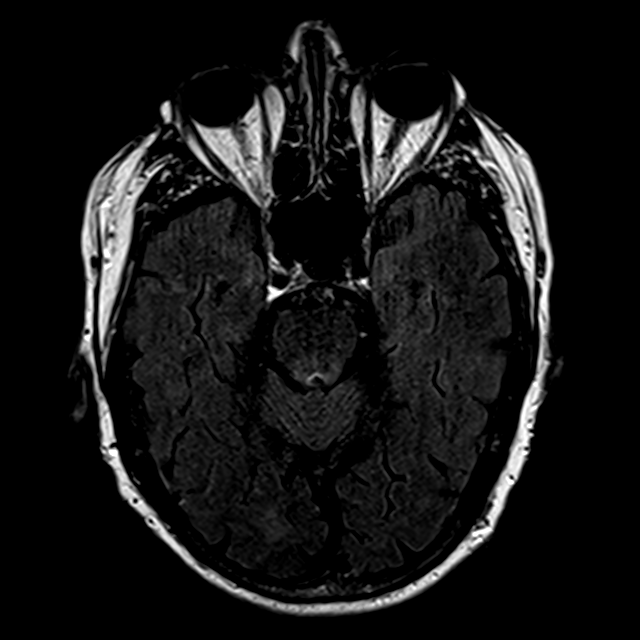
[im 33/49]
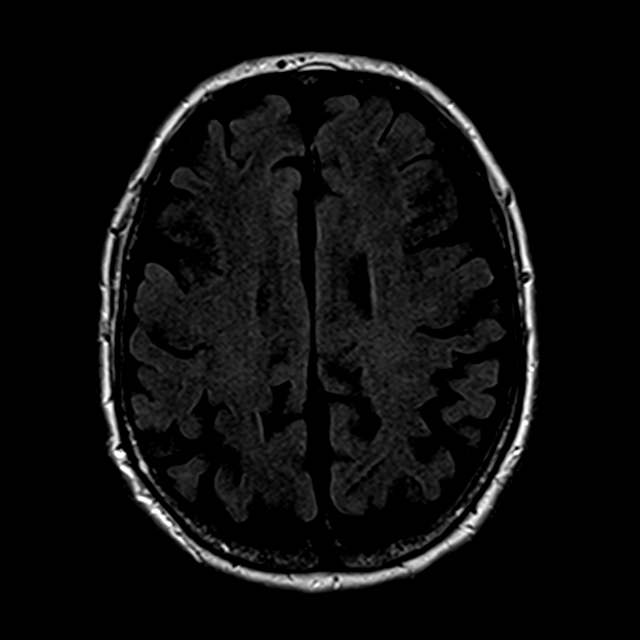
[im 49/49]
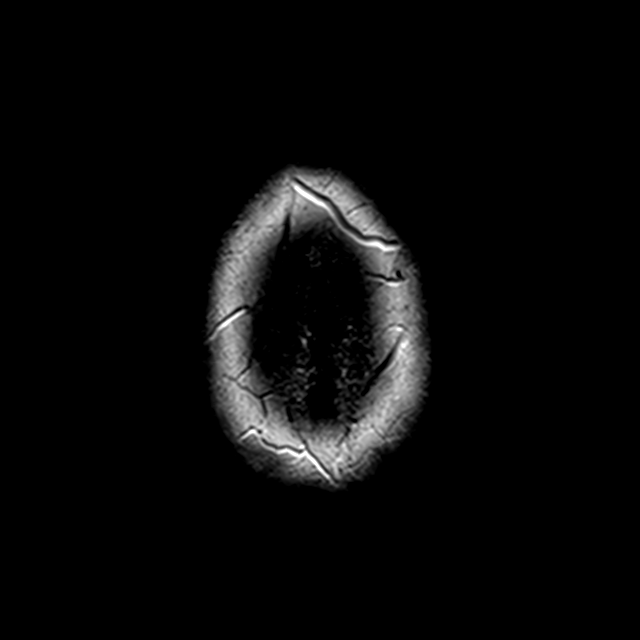

[Series 18: T1 post-contrast · axial · 2.0mm · 0.44mm/px · z∈[-78,+78]mm · 6 of 80 slices shown]
[im 1/80]
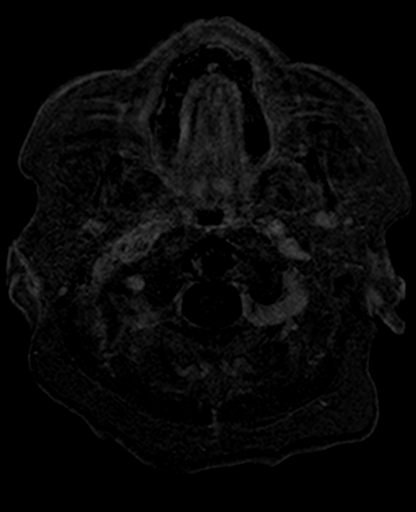
[im 16/80]
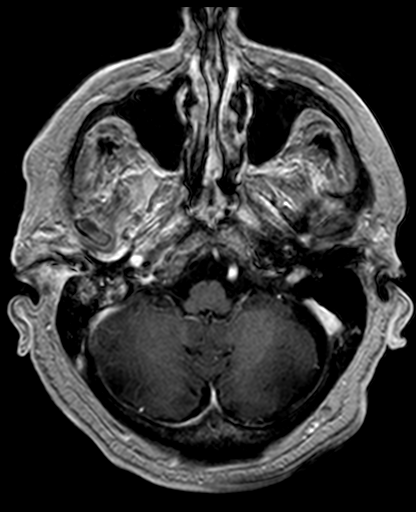
[im 32/80]
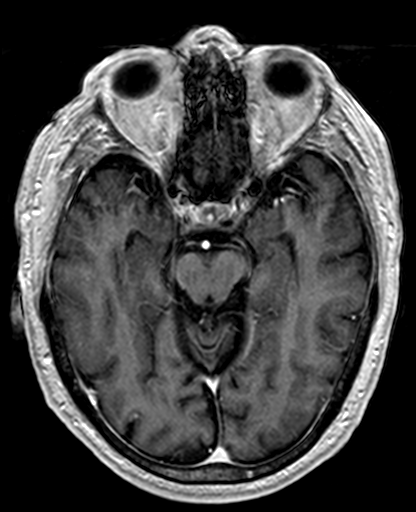
[im 48/80]
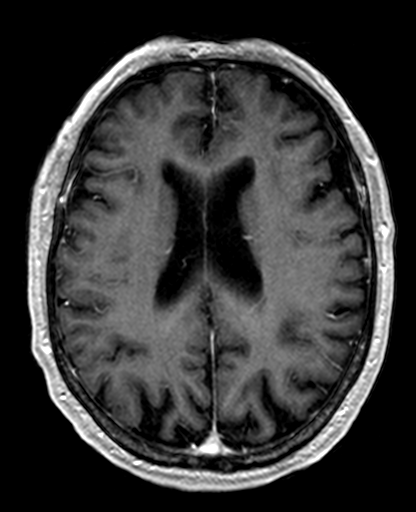
[im 64/80]
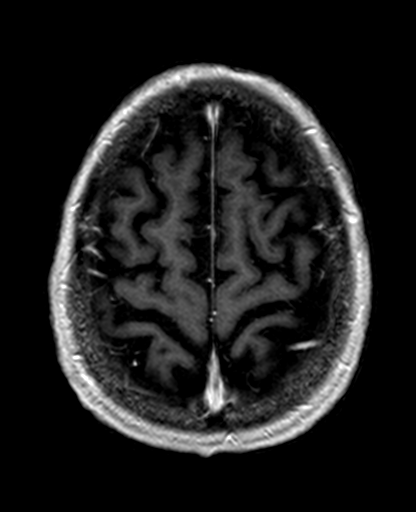
[im 80/80]
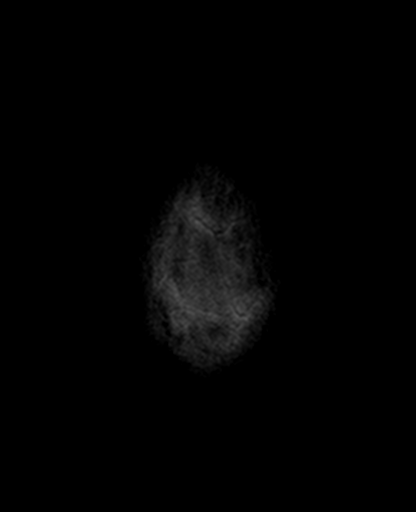

[25 of 48 positions shown; findings below may reference images not displayed]

FINDINGS: INTERNAL AUDITORY CANALS: No cerebellar pontine angle masses. Normal
course, caliber and signal of the bilateral seventh and eighth
cranial nerves without abnormal enhancement. Symmetric, normal
internal auditory canals. Normal appearance of the inner ear
structures.

INTRACRANIAL CONTENTS: No reduced diffusion to suggest acute
ischemia, typical infection or hypercellular tumor. No
susceptibility artifact to suggest hemorrhage. The ventricles and
sulci are normal for patient's age. A few subcentimeter
supratentorial white matter FLAIR T2 hyperintensities seen with
chronic small vessel ischemic changes, less than expected for age.
No suspicious parenchymal signal, mass lesions, mass effect. No
abnormal intraparenchymal or extra-axial enhancement. No abnormal
extra-axial fluid collections. No extra-axial masses.

VASCULAR: Normal major intracranial vascular flow voids present at
skull base. Patent dural venous sinuses though not tailored for
evaluation.

SKULL AND UPPER CERVICAL SPINE: No abnormal sellar expansion. No
suspicious calvarial bone marrow signal. Craniocervical junction
maintained.

SINUSES/ ORBITS: RIGHT mastoid effusion. Partially imaged right
mastoid tip 11 mm rim enhancing fluid collection with patchy right
inferior mastoid enhancement. Paranasal sinuses are well aerated.
The included ocular globes and orbital contents are non-suspicious.
Status post bilateral ocular lens implants.

OTHER: Abnormally thickened right external auditory canal soft
tissues with homogeneous enhancement. Soft tissue enhancement about
the mastoid tip (series 18, image 7) and may affect the extracranial
facial nerve.
IMPRESSION: 1. Normal MRI of the head with without contrast.
2. Subcentimeter fluid collections RIGHT mastoid air cells
concerning for abscess. RIGHT mastoiditis. Enhancement about the
mastoid tip consistent with cellulitis and potential Laudelino Smirnova
abscess, incompletely evaluated.
3. RIGHT otitis externus.
4. These results will be called to the ordering clinician or
representative by the Radiologist Assistant, and communication
documented in the PACS or zVision Dashboard.

## 2020-02-08 ENCOUNTER — Other Ambulatory Visit: Payer: Self-pay | Admitting: *Deleted

## 2020-02-08 MED ORDER — WARFARIN SODIUM 5 MG PO TABS: ORAL_TABLET | ORAL | 3 refills | Status: DC

## 2020-02-15 ENCOUNTER — Ambulatory Visit (INDEPENDENT_AMBULATORY_CARE_PROVIDER_SITE_OTHER): Payer: Medicare Other | Admitting: *Deleted

## 2020-02-15 DIAGNOSIS — I4821 Permanent atrial fibrillation: Secondary | ICD-10-CM

## 2020-02-15 DIAGNOSIS — Z5181 Encounter for therapeutic drug level monitoring: Secondary | ICD-10-CM | POA: Diagnosis not present

## 2020-02-15 LAB — POCT INR: INR: 3 (ref 2.0–3.0)

## 2020-02-15 NOTE — Patient Instructions (Signed)
Decrease warfarin to 1 1/2 tablets daily except 1 tablet on Mondays, Wednesdays and Fridays Recheck in 3.5 wks

## 2020-02-20 DIAGNOSIS — L851 Acquired keratosis [keratoderma] palmaris et plantaris: Secondary | ICD-10-CM | POA: Diagnosis not present

## 2020-02-20 DIAGNOSIS — B351 Tinea unguium: Secondary | ICD-10-CM | POA: Diagnosis not present

## 2020-02-20 DIAGNOSIS — E1142 Type 2 diabetes mellitus with diabetic polyneuropathy: Secondary | ICD-10-CM | POA: Diagnosis not present

## 2020-02-26 DIAGNOSIS — L02216 Cutaneous abscess of umbilicus: Secondary | ICD-10-CM | POA: Diagnosis not present

## 2020-02-26 DIAGNOSIS — L0100 Impetigo, unspecified: Secondary | ICD-10-CM | POA: Diagnosis not present

## 2020-03-05 DIAGNOSIS — E119 Type 2 diabetes mellitus without complications: Secondary | ICD-10-CM | POA: Diagnosis not present

## 2020-03-05 DIAGNOSIS — I4891 Unspecified atrial fibrillation: Secondary | ICD-10-CM | POA: Diagnosis not present

## 2020-03-05 DIAGNOSIS — I10 Essential (primary) hypertension: Secondary | ICD-10-CM | POA: Diagnosis not present

## 2020-03-12 ENCOUNTER — Ambulatory Visit (INDEPENDENT_AMBULATORY_CARE_PROVIDER_SITE_OTHER): Payer: Medicare Other | Admitting: *Deleted

## 2020-03-12 DIAGNOSIS — Z5181 Encounter for therapeutic drug level monitoring: Secondary | ICD-10-CM

## 2020-03-12 DIAGNOSIS — I4821 Permanent atrial fibrillation: Secondary | ICD-10-CM | POA: Diagnosis not present

## 2020-03-12 LAB — POCT INR: INR: 2.4 (ref 2.0–3.0)

## 2020-03-12 NOTE — Patient Instructions (Signed)
Continue warfarin 1 1/2 tablets daily except 1 tablet on Mondays, Wednesdays and Fridays Recheck in 4 wks  

## 2020-04-02 DIAGNOSIS — C9111 Chronic lymphocytic leukemia of B-cell type in remission: Secondary | ICD-10-CM | POA: Diagnosis not present

## 2020-04-02 DIAGNOSIS — I4891 Unspecified atrial fibrillation: Secondary | ICD-10-CM | POA: Diagnosis not present

## 2020-04-02 DIAGNOSIS — I10 Essential (primary) hypertension: Secondary | ICD-10-CM | POA: Diagnosis not present

## 2020-04-02 DIAGNOSIS — Z299 Encounter for prophylactic measures, unspecified: Secondary | ICD-10-CM | POA: Diagnosis not present

## 2020-04-02 DIAGNOSIS — Z6839 Body mass index (BMI) 39.0-39.9, adult: Secondary | ICD-10-CM | POA: Diagnosis not present

## 2020-04-02 DIAGNOSIS — E1165 Type 2 diabetes mellitus with hyperglycemia: Secondary | ICD-10-CM | POA: Diagnosis not present

## 2020-04-04 DIAGNOSIS — I10 Essential (primary) hypertension: Secondary | ICD-10-CM | POA: Diagnosis not present

## 2020-04-04 DIAGNOSIS — I4891 Unspecified atrial fibrillation: Secondary | ICD-10-CM | POA: Diagnosis not present

## 2020-04-04 DIAGNOSIS — E119 Type 2 diabetes mellitus without complications: Secondary | ICD-10-CM | POA: Diagnosis not present

## 2020-04-09 ENCOUNTER — Ambulatory Visit (INDEPENDENT_AMBULATORY_CARE_PROVIDER_SITE_OTHER): Payer: Medicare Other | Admitting: *Deleted

## 2020-04-09 DIAGNOSIS — I4821 Permanent atrial fibrillation: Secondary | ICD-10-CM

## 2020-04-09 DIAGNOSIS — Z5181 Encounter for therapeutic drug level monitoring: Secondary | ICD-10-CM

## 2020-04-09 LAB — POCT INR: INR: 1.3 — AB (ref 2.0–3.0)

## 2020-04-09 NOTE — Patient Instructions (Signed)
Take warfarin 2 1/2 tablets tonight, 2 tablets tomorrow night then resume 1 1/2 tablets daily except 1 tablet on Mondays, Wednesdays and Fridays Recheck in 1 wk

## 2020-04-16 ENCOUNTER — Ambulatory Visit (INDEPENDENT_AMBULATORY_CARE_PROVIDER_SITE_OTHER): Payer: Medicare Other | Admitting: *Deleted

## 2020-04-16 ENCOUNTER — Other Ambulatory Visit: Payer: Self-pay

## 2020-04-16 DIAGNOSIS — I4821 Permanent atrial fibrillation: Secondary | ICD-10-CM | POA: Diagnosis not present

## 2020-04-16 DIAGNOSIS — Z5181 Encounter for therapeutic drug level monitoring: Secondary | ICD-10-CM

## 2020-04-16 LAB — POCT INR: INR: 2.2 (ref 2.0–3.0)

## 2020-04-16 NOTE — Patient Instructions (Signed)
Continue warfarin 1 1/2 tablets daily except 1 tablet on Mondays, Wednesdays and Fridays Recheck in 3 wks

## 2020-04-30 DIAGNOSIS — B351 Tinea unguium: Secondary | ICD-10-CM | POA: Diagnosis not present

## 2020-04-30 DIAGNOSIS — E1142 Type 2 diabetes mellitus with diabetic polyneuropathy: Secondary | ICD-10-CM | POA: Diagnosis not present

## 2020-04-30 DIAGNOSIS — L851 Acquired keratosis [keratoderma] palmaris et plantaris: Secondary | ICD-10-CM | POA: Diagnosis not present

## 2020-05-07 ENCOUNTER — Ambulatory Visit (INDEPENDENT_AMBULATORY_CARE_PROVIDER_SITE_OTHER): Payer: Medicare Other | Admitting: *Deleted

## 2020-05-07 DIAGNOSIS — I4821 Permanent atrial fibrillation: Secondary | ICD-10-CM | POA: Diagnosis not present

## 2020-05-07 DIAGNOSIS — Z5181 Encounter for therapeutic drug level monitoring: Secondary | ICD-10-CM | POA: Diagnosis not present

## 2020-05-07 LAB — POCT INR: INR: 1.1 — AB (ref 2.0–3.0)

## 2020-05-07 NOTE — Patient Instructions (Signed)
Take warfarin 2 tablets tonight then increase dose to 1 1/2 tablets daily Recheck in 1 wk

## 2020-05-14 ENCOUNTER — Ambulatory Visit (INDEPENDENT_AMBULATORY_CARE_PROVIDER_SITE_OTHER): Payer: Medicare Other | Admitting: *Deleted

## 2020-05-14 DIAGNOSIS — I4821 Permanent atrial fibrillation: Secondary | ICD-10-CM | POA: Diagnosis not present

## 2020-05-14 DIAGNOSIS — Z5181 Encounter for therapeutic drug level monitoring: Secondary | ICD-10-CM

## 2020-05-14 LAB — POCT INR: INR: 2.1 (ref 2.0–3.0)

## 2020-05-14 NOTE — Patient Instructions (Signed)
Continue warfarin 1 1/2 tablets daily Recheck in 10 days

## 2020-05-23 ENCOUNTER — Ambulatory Visit (INDEPENDENT_AMBULATORY_CARE_PROVIDER_SITE_OTHER): Payer: Medicare Other | Admitting: *Deleted

## 2020-05-23 DIAGNOSIS — Z5181 Encounter for therapeutic drug level monitoring: Secondary | ICD-10-CM | POA: Diagnosis not present

## 2020-05-23 DIAGNOSIS — I4821 Permanent atrial fibrillation: Secondary | ICD-10-CM | POA: Diagnosis not present

## 2020-05-23 LAB — POCT INR: INR: 2.3 (ref 2.0–3.0)

## 2020-05-23 NOTE — Patient Instructions (Signed)
Continue warfarin 1 1/2 tablets daily. Recheck in 2 wks 

## 2020-06-03 DIAGNOSIS — F419 Anxiety disorder, unspecified: Secondary | ICD-10-CM | POA: Diagnosis not present

## 2020-06-03 DIAGNOSIS — I4891 Unspecified atrial fibrillation: Secondary | ICD-10-CM | POA: Diagnosis not present

## 2020-06-03 DIAGNOSIS — E1165 Type 2 diabetes mellitus with hyperglycemia: Secondary | ICD-10-CM | POA: Diagnosis not present

## 2020-06-03 DIAGNOSIS — E7800 Pure hypercholesterolemia, unspecified: Secondary | ICD-10-CM | POA: Diagnosis not present

## 2020-06-06 ENCOUNTER — Ambulatory Visit (INDEPENDENT_AMBULATORY_CARE_PROVIDER_SITE_OTHER): Payer: Medicare Other | Admitting: *Deleted

## 2020-06-06 DIAGNOSIS — I4821 Permanent atrial fibrillation: Secondary | ICD-10-CM

## 2020-06-06 DIAGNOSIS — Z5181 Encounter for therapeutic drug level monitoring: Secondary | ICD-10-CM

## 2020-06-06 LAB — POCT INR: INR: 2.4 (ref 2.0–3.0)

## 2020-06-06 NOTE — Patient Instructions (Signed)
Continue warfarin 1 1/2 tablets daily Recheck in 4 wks

## 2020-06-27 ENCOUNTER — Encounter: Payer: Self-pay | Admitting: Cardiology

## 2020-06-27 ENCOUNTER — Encounter: Payer: Self-pay | Admitting: *Deleted

## 2020-06-27 ENCOUNTER — Ambulatory Visit (INDEPENDENT_AMBULATORY_CARE_PROVIDER_SITE_OTHER): Payer: Medicare Other | Admitting: Cardiology

## 2020-06-27 VITALS — BP 132/52 | HR 63 | Ht 72.0 in | Wt 276.0 lb

## 2020-06-27 DIAGNOSIS — I4821 Permanent atrial fibrillation: Secondary | ICD-10-CM

## 2020-06-27 DIAGNOSIS — I350 Nonrheumatic aortic (valve) stenosis: Secondary | ICD-10-CM | POA: Diagnosis not present

## 2020-06-27 NOTE — Patient Instructions (Addendum)

## 2020-06-27 NOTE — Progress Notes (Signed)
Cardiology Office Note  Date: 06/27/2020   ID: [name] [name], DOB 1934-08-26, MRN [id number]  PCP:  [name], MD  Cardiologist:  [name], MD Electrophysiologist:  None   Chief Complaint  Patient presents with  . Cardiac follow-up    History of Present Illness: [name] [name] is an 86 y.o. male last seen in September 2021.  He is here for a routine visit.  He does not report any obvious angina symptoms, no sense of palpitations or unusual shortness of breath.  I reviewed his medications which are outlined below.  Today's blood pressure is adequately controlled.  He has remained on Lanoxin over time, tolerating well for control of atrial fibrillation.  He continues to follow regularly with Dr. Manuella Ghazi for routine lab work.  He remains on Coumadin with follow-up in the anticoagulation clinic, recent INR 2.4.  No spontaneous bleeding problems beyond an occasional nosebleed.  We did talk about getting a follow-up echocardiogram done prior to his next visit for follow-up of aortic stenosis.  Past Medical History:  Diagnosis Date  . Atrial fibrillation ([place])   . Chronic lymphocytic leukemia ([place])   . Coronary atherosclerosis of native coronary artery    Nonobstructive  . Essential hypertension   . Hyperlipidemia   . IgA nephropathy   . Type 2 diabetes mellitus ([place])   . UTI (urinary tract infection)     Past Surgical History:  Procedure Laterality Date  . AMPUTATION Left 09/15/2012   Procedure: PARTIAL AMPUTATION 3rd TOE LEFT FOOT;  Surgeon: Marcheta Grammes, DPM;  [facility];  Service: Orthopedics;  Laterality: Left;  . AMPUTATION Left 04/20/2013   Procedure: PARTIAL AMPUTATION 2ND TOE LEFT FOOT;  Surgeon: Marcheta Grammes, DPM;  [facility];  Service: Orthopedics;  Laterality: Left;  . ANAL FISSURE REPAIR    . APPLICATION OF WOUND VAC Right 09/09/2016   Procedure: APPLICATION OF WOUND VAC;  Surgeon: Caprice Beaver, DPM;  [facility];   Service: Podiatry;  Laterality: Right;  . CATARACT EXTRACTION W/PHACO Right 09/30/2015   Procedure: CATARACT EXTRACTION PHACO AND INTRAOCULAR LENS PLACEMENT (IOC);  Surgeon: Tonny Branch, MD;  [facility];  Service: Ophthalmology;  Laterality: Right;  CDE: 11.78  . CATARACT EXTRACTION W/PHACO Left 10/31/2015   Procedure: CATARACT EXTRACTION PHACO AND INTRAOCULAR LENS PLACEMENT LEFT EYE; CDE:  9.10;  Surgeon: Tonny Branch, MD;  [facility];  Service: Ophthalmology;  Laterality: Left;  . COLONOSCOPY  08/19/2011   Procedure: COLONOSCOPY;  Surgeon: Rogene Houston, MD;  [facility];  Service: Endoscopy;  Laterality: N/A;  930  . COLONOSCOPY N/A 09/19/2014   Procedure: COLONOSCOPY;  Surgeon: Rogene Houston, MD;  [facility];  Service: Endoscopy;  Laterality: N/A;  1055  . FEMORAL HERNIA REPAIR    . INCISION AND DRAINAGE Right 09/09/2016   Procedure: DEBRIDEMENT WOUND RT heel with wound vac attachment;  Surgeon: Caprice Beaver, DPM;  [facility];  Service: Podiatry;  Laterality: Right;  . INCISION AND DRAINAGE ABSCESS Right 02/16/2018   Procedure: INCISION AND DRAINAGE ABSCESS;  Surgeon: Leta Baptist, MD;  Location: [place];  Service: ENT;  Laterality: Right;  . INCISION AND DRAINAGE OF WOUND Right 08/12/2016   Procedure: DEBRIDEMENT WOUND RIGHT HEEL;  Surgeon: Caprice Beaver, DPM;  [facility];  Service: Podiatry;  Laterality: Right;  right heel  . MASS EXCISION Right 09/28/2017   Procedure: EXCISION OF EXTERNAL AUDITORY CANAL MASS;  Surgeon: Leta Baptist, MD;  Location: [place];  Service: ENT;  Laterality: Right;  . MASTOIDECTOMY Right 02/16/2018   Procedure: REVISION MASTOIDECTOMY;  Surgeon: Leta Baptist, MD;  [facility]: [facility];  Service: ENT;  Laterality: Right;  . Open repair left quadriceps tendon.  08/22/07   Dr. Aline Brochure  . Right total knee replacement    . TRANSURETHRAL RESECTION OF PROSTATE    . TYMPANOMASTOIDECTOMY Right 11/10/2017   Procedure:  RIGHT TYMPANOMASTOIDECTOMY;  Surgeon: Leta Baptist, MD;  Location: [place];  Service: ENT;  Laterality: Right;  . WOUND EXPLORATION Right 08/12/2016   Procedure: EXPLORATION OF WOUND FOR FOREIGN BODY RIGHT HEEL;  Surgeon: Caprice Beaver, DPM;  [facility];  Service: Podiatry;  Laterality: Right;  right heel    Current Outpatient Medications  Medication Sig Dispense Refill  . amLODipine (NORVASC) 10 MG tablet Take 10 mg by mouth daily.    . Ascorbic Acid (VITAMIN C) 1000 MG tablet Take 1,000 mg by mouth daily with breakfast.    . atorvastatin (LIPITOR) 10 MG tablet Take 10 mg by mouth every evening.    . Calcium Carb-Cholecalciferol 600-800 MG-UNIT TABS Take 2 tablets by mouth daily with breakfast.    . cholecalciferol (VITAMIN D) 1000 units tablet Take 1,000 Units by mouth every morning.    . ciclopirox (LOPROX) 0.77 % cream Apply 1 application topically every morning. APPLY TO THE FEET    . Coenzyme Q10 (CO Q-10) 100 MG CAPS Take 100 mg by mouth daily with breakfast.     . digoxin (LANOXIN) 0.125 MG tablet Take 0.125 mg by mouth daily with breakfast.     . finasteride (PROSCAR) 5 MG tablet Take 5 mg by mouth every morning.     . furosemide (LASIX) 20 MG tablet Take 40 mg by mouth daily.     Marland Kitchen HUMALOG KWIKPEN 100 UNIT/ML KwikPen Inject 1-9 Units into the skin 3 (three) times daily. CBG 121 - 150: 1 unit,  CBG 151 - 200: 2 units,  CBG 201 - 250: 3 units,  CBG 251 - 300: 5 units,  CBG 301 - 350: 7 units,  CBG 351 - 400: 9 units    . Insulin Detemir (LEVEMIR FLEXTOUCH) 100 UNIT/ML Pen Inject 20 Units into the skin daily. (Patient taking differently: Inject 60 Units into the skin daily.)    . lisinopril (PRINIVIL,ZESTRIL) 20 MG tablet Take 40 mg by mouth daily with supper.    Marland Kitchen LORazepam (ATIVAN) 1 MG tablet Take 1 mg by mouth 2 (two) times daily as needed.    . Multiple Vitamin (MULTIVITAMIN WITH MINERALS) TABS tablet Take 1 tablet by mouth daily with breakfast.    . Omega-3  Fatty Acids (FISH OIL) 1200 MG CAPS Take 2 capsules by mouth 2 (two) times daily.    . pioglitazone (ACTOS) 15 MG tablet Take 7.5 mg by mouth every evening.     . polyethylene glycol (MIRALAX / GLYCOLAX) packet Take 17 g by mouth daily.    . potassium chloride SA (K-DUR,KLOR-CON) 20 MEQ tablet Take 40 mEq by mouth daily. Take starting 01/07/2018    . VICTOZA 18 MG/3ML SOPN Inject 1.8 mg into the skin daily with breakfast.    . vitamin E 400 UNIT capsule Take 400 Units by mouth every morning.    . warfarin (COUMADIN) 5 MG tablet TAKE 1 & 1/2 (ONE & ONE-HALF) TABLETS BY MOUTH ONCE DAILY EXCEPT 1 TABLET ON WEDNESDAYS AND SATURDAYS OR  AS  DIRECTED  BY  ANTICOAGULATION  CLINIC 135 tablet 3   No current facility-administered medications for this visit.   Allergies:  Erythromycin and Penicillins   ROS: Chronic hearing loss.  Physical Exam: VS:  BP (!) 132/52   Pulse 63   Ht 6' (1.829 m)   Wt 276 lb (125.2 kg)   SpO2 96%   BMI 37.43 kg/m , BMI Body mass index is 37.43 kg/m.  Wt Readings from Last 3 Encounters:  06/27/20 276 lb (125.2 kg)  12/21/19 264 lb (119.7 kg)  06/20/19 274 lb (124.3 kg)    General: Patient appears comfortable at rest. HEENT: Conjunctiva and lids normal, wearing a mask. Neck: Supple, no elevated JVP or carotid bruits, no thyromegaly. Lungs: Clear to auscultation, nonlabored breathing at rest. Cardiac: Irregularly irregular, no S3, 2/6 systolic murmur, no pericardial rub. Extremities: Mild lower leg edema.  ECG:  An ECG dated 12/21/2019 was personally reviewed today and demonstrated:  Rate controlled atrial fibrillation with right bundle branch block and left anterior fascicular block, increased voltage.  Recent Labwork: No results found for requested labs within last 8760 hours.     Component Value Date/Time   CHOL 124 10/06/2016 0500   TRIG 150 (H) 10/06/2016 0500   HDL 23 (L) 10/06/2016 0500   CHOLHDL 5.4 10/06/2016 0500   VLDL 30 10/06/2016 0500    LDLCALC 71 10/06/2016 0500    Other Studies Reviewed Today:  Echocardiogram 08/04/2017: Study Conclusions  - Left ventricle: The cavity size was normal. Wall thickness was increased in a pattern of mild LVH. Systolic function was normal. The estimated ejection fraction was in the range of 60% to 65%. Wall motion was normal; there were no regional wall motion abnormalities. The study is not technically sufficient to allow evaluation of LV diastolic function. - Aortic valve: Mildly calcified annulus. Mildly thickened leaflets. There was mild stenosis. There was mild regurgitation. Mean gradient (S): 10 mm Hg. Valve area (VTI): 1.7 cm^2. Valve area (Vmax): 1.74 cm^2. Valve area (Vmean): 1.71 cm^2. - Mitral valve: Mildly calcified annulus. Mildly thickened leaflets. - Left atrium: The atrium was severely dilated. - Right ventricle: The cavity size was mildly dilated. - Right atrium: The atrium was moderately dilated. - Technically adequate study.  Assessment and Plan:  1.  Permanent atrial fibrillation.  CHA2DS2-VASc score is 5.  He is doing well with good heart rate control on Lanoxin which he has tolerated long-term.  He also continues on Coumadin for stroke prophylaxis with follow-up in the anticoagulation clinic.  Recent INR therapeutic.  2.  Aortic stenosis, mild by last assessment in 2019.  We will obtain a follow-up echocardiogram just prior to his next visit in 6 months.  Medication Adjustments/Labs and Tests Ordered: Current medicines are reviewed at length with the patient today.  Concerns regarding medicines are outlined above.   Tests Ordered: Orders Placed This Encounter  Procedures  . ECHOCARDIOGRAM COMPLETE    Medication Changes: No orders of the defined types were placed in this encounter.   Disposition:  Follow up 6 months in the nose.  Signed, Satira Sark, MD, [facility] 06/27/2020 2:29 PM    [place] [facility] [place], [place], [place] [address] Phone: [phone]; Fax: [phone]

## 2020-07-04 ENCOUNTER — Ambulatory Visit (INDEPENDENT_AMBULATORY_CARE_PROVIDER_SITE_OTHER): Payer: Medicare Other | Admitting: *Deleted

## 2020-07-04 ENCOUNTER — Other Ambulatory Visit: Payer: Self-pay

## 2020-07-04 DIAGNOSIS — Z5181 Encounter for therapeutic drug level monitoring: Secondary | ICD-10-CM

## 2020-07-04 DIAGNOSIS — I4821 Permanent atrial fibrillation: Secondary | ICD-10-CM

## 2020-07-04 LAB — POCT INR: INR: 2.3 (ref 2.0–3.0)

## 2020-07-04 NOTE — Patient Instructions (Signed)
Continue warfarin 1 1/2 tablets daily Recheck in 6 wks

## 2020-07-05 DIAGNOSIS — Z6841 Body Mass Index (BMI) 40.0 and over, adult: Secondary | ICD-10-CM | POA: Diagnosis not present

## 2020-07-05 DIAGNOSIS — E1165 Type 2 diabetes mellitus with hyperglycemia: Secondary | ICD-10-CM | POA: Diagnosis not present

## 2020-07-05 DIAGNOSIS — C9111 Chronic lymphocytic leukemia of B-cell type in remission: Secondary | ICD-10-CM | POA: Diagnosis not present

## 2020-07-05 DIAGNOSIS — I4891 Unspecified atrial fibrillation: Secondary | ICD-10-CM | POA: Diagnosis not present

## 2020-07-05 DIAGNOSIS — E1142 Type 2 diabetes mellitus with diabetic polyneuropathy: Secondary | ICD-10-CM | POA: Diagnosis not present

## 2020-07-05 DIAGNOSIS — Z299 Encounter for prophylactic measures, unspecified: Secondary | ICD-10-CM | POA: Diagnosis not present

## 2020-07-05 DIAGNOSIS — I10 Essential (primary) hypertension: Secondary | ICD-10-CM | POA: Diagnosis not present

## 2020-07-05 DIAGNOSIS — R52 Pain, unspecified: Secondary | ICD-10-CM | POA: Diagnosis not present

## 2020-07-09 DIAGNOSIS — B351 Tinea unguium: Secondary | ICD-10-CM | POA: Diagnosis not present

## 2020-07-09 DIAGNOSIS — E1142 Type 2 diabetes mellitus with diabetic polyneuropathy: Secondary | ICD-10-CM | POA: Diagnosis not present

## 2020-07-09 DIAGNOSIS — L84 Corns and callosities: Secondary | ICD-10-CM | POA: Diagnosis not present

## 2020-07-31 DIAGNOSIS — H7011 Chronic mastoiditis, right ear: Secondary | ICD-10-CM | POA: Diagnosis not present

## 2020-07-31 DIAGNOSIS — H903 Sensorineural hearing loss, bilateral: Secondary | ICD-10-CM | POA: Diagnosis not present

## 2020-08-03 DIAGNOSIS — F419 Anxiety disorder, unspecified: Secondary | ICD-10-CM | POA: Diagnosis not present

## 2020-08-03 DIAGNOSIS — E1142 Type 2 diabetes mellitus with diabetic polyneuropathy: Secondary | ICD-10-CM | POA: Diagnosis not present

## 2020-08-03 DIAGNOSIS — E7800 Pure hypercholesterolemia, unspecified: Secondary | ICD-10-CM | POA: Diagnosis not present

## 2020-08-03 DIAGNOSIS — I10 Essential (primary) hypertension: Secondary | ICD-10-CM | POA: Diagnosis not present

## 2020-08-15 ENCOUNTER — Ambulatory Visit (INDEPENDENT_AMBULATORY_CARE_PROVIDER_SITE_OTHER): Payer: Medicare Other | Admitting: *Deleted

## 2020-08-15 DIAGNOSIS — I4821 Permanent atrial fibrillation: Secondary | ICD-10-CM | POA: Diagnosis not present

## 2020-08-15 DIAGNOSIS — Z5181 Encounter for therapeutic drug level monitoring: Secondary | ICD-10-CM | POA: Diagnosis not present

## 2020-08-15 LAB — POCT INR: INR: 2.5 (ref 2.0–3.0)

## 2020-08-15 NOTE — Patient Instructions (Signed)
Continue warfarin 1 1/2 tablets daily Recheck in 6 wks

## 2020-09-17 DIAGNOSIS — E1142 Type 2 diabetes mellitus with diabetic polyneuropathy: Secondary | ICD-10-CM | POA: Diagnosis not present

## 2020-09-17 DIAGNOSIS — L84 Corns and callosities: Secondary | ICD-10-CM | POA: Diagnosis not present

## 2020-09-17 DIAGNOSIS — L309 Dermatitis, unspecified: Secondary | ICD-10-CM | POA: Diagnosis not present

## 2020-09-17 DIAGNOSIS — B351 Tinea unguium: Secondary | ICD-10-CM | POA: Diagnosis not present

## 2020-09-26 ENCOUNTER — Other Ambulatory Visit: Payer: Self-pay

## 2020-09-26 ENCOUNTER — Ambulatory Visit (INDEPENDENT_AMBULATORY_CARE_PROVIDER_SITE_OTHER): Payer: Medicare Other | Admitting: *Deleted

## 2020-09-26 DIAGNOSIS — I4821 Permanent atrial fibrillation: Secondary | ICD-10-CM | POA: Diagnosis not present

## 2020-09-26 DIAGNOSIS — Z5181 Encounter for therapeutic drug level monitoring: Secondary | ICD-10-CM | POA: Diagnosis not present

## 2020-09-26 LAB — POCT INR: INR: 2.5 (ref 2.0–3.0)

## 2020-09-26 NOTE — Patient Instructions (Signed)
Continue warfarin 1 1/2 tablets daily Recheck in 6 wks

## 2020-10-03 DIAGNOSIS — F419 Anxiety disorder, unspecified: Secondary | ICD-10-CM | POA: Diagnosis not present

## 2020-10-03 DIAGNOSIS — E1142 Type 2 diabetes mellitus with diabetic polyneuropathy: Secondary | ICD-10-CM | POA: Diagnosis not present

## 2020-10-03 DIAGNOSIS — I10 Essential (primary) hypertension: Secondary | ICD-10-CM | POA: Diagnosis not present

## 2020-10-03 DIAGNOSIS — E7800 Pure hypercholesterolemia, unspecified: Secondary | ICD-10-CM | POA: Diagnosis not present

## 2020-10-08 DIAGNOSIS — Z6841 Body Mass Index (BMI) 40.0 and over, adult: Secondary | ICD-10-CM | POA: Diagnosis not present

## 2020-10-08 DIAGNOSIS — Z299 Encounter for prophylactic measures, unspecified: Secondary | ICD-10-CM | POA: Diagnosis not present

## 2020-10-08 DIAGNOSIS — C9111 Chronic lymphocytic leukemia of B-cell type in remission: Secondary | ICD-10-CM | POA: Diagnosis not present

## 2020-10-08 DIAGNOSIS — E1165 Type 2 diabetes mellitus with hyperglycemia: Secondary | ICD-10-CM | POA: Diagnosis not present

## 2020-10-08 DIAGNOSIS — I10 Essential (primary) hypertension: Secondary | ICD-10-CM | POA: Diagnosis not present

## 2020-10-08 DIAGNOSIS — I4891 Unspecified atrial fibrillation: Secondary | ICD-10-CM | POA: Diagnosis not present

## 2020-10-21 DIAGNOSIS — U071 COVID-19: Secondary | ICD-10-CM | POA: Diagnosis not present

## 2020-10-21 DIAGNOSIS — J399 Disease of upper respiratory tract, unspecified: Secondary | ICD-10-CM | POA: Diagnosis not present

## 2020-10-21 DIAGNOSIS — Z6841 Body Mass Index (BMI) 40.0 and over, adult: Secondary | ICD-10-CM | POA: Diagnosis not present

## 2020-10-21 DIAGNOSIS — Z299 Encounter for prophylactic measures, unspecified: Secondary | ICD-10-CM | POA: Diagnosis not present

## 2020-10-21 DIAGNOSIS — Z713 Dietary counseling and surveillance: Secondary | ICD-10-CM | POA: Diagnosis not present

## 2020-10-28 DIAGNOSIS — Z23 Encounter for immunization: Secondary | ICD-10-CM | POA: Diagnosis not present

## 2020-11-07 ENCOUNTER — Ambulatory Visit (INDEPENDENT_AMBULATORY_CARE_PROVIDER_SITE_OTHER): Payer: Medicare Other | Admitting: *Deleted

## 2020-11-07 DIAGNOSIS — I4821 Permanent atrial fibrillation: Secondary | ICD-10-CM | POA: Diagnosis not present

## 2020-11-07 DIAGNOSIS — Z5181 Encounter for therapeutic drug level monitoring: Secondary | ICD-10-CM

## 2020-11-07 LAB — POCT INR: INR: 3.5 — AB (ref 2.0–3.0)

## 2020-11-07 NOTE — Patient Instructions (Signed)
Hold warfarin tonight then resume 1 1/2 tablets daily Recheck in 4 wks

## 2020-11-14 DIAGNOSIS — H16211 Exposure keratoconjunctivitis, right eye: Secondary | ICD-10-CM | POA: Diagnosis not present

## 2020-11-14 DIAGNOSIS — H0223A Paralytic lagophthalmos right eye, upper and lower eyelids: Secondary | ICD-10-CM | POA: Diagnosis not present

## 2020-11-14 DIAGNOSIS — H524 Presbyopia: Secondary | ICD-10-CM | POA: Diagnosis not present

## 2020-11-14 DIAGNOSIS — E109 Type 1 diabetes mellitus without complications: Secondary | ICD-10-CM | POA: Diagnosis not present

## 2020-11-14 DIAGNOSIS — Z961 Presence of intraocular lens: Secondary | ICD-10-CM | POA: Diagnosis not present

## 2020-11-14 DIAGNOSIS — Z794 Long term (current) use of insulin: Secondary | ICD-10-CM | POA: Diagnosis not present

## 2020-11-26 DIAGNOSIS — B351 Tinea unguium: Secondary | ICD-10-CM | POA: Diagnosis not present

## 2020-11-26 DIAGNOSIS — E1142 Type 2 diabetes mellitus with diabetic polyneuropathy: Secondary | ICD-10-CM | POA: Diagnosis not present

## 2020-11-26 DIAGNOSIS — L84 Corns and callosities: Secondary | ICD-10-CM | POA: Diagnosis not present

## 2020-12-04 DIAGNOSIS — F419 Anxiety disorder, unspecified: Secondary | ICD-10-CM | POA: Diagnosis not present

## 2020-12-04 DIAGNOSIS — E1142 Type 2 diabetes mellitus with diabetic polyneuropathy: Secondary | ICD-10-CM | POA: Diagnosis not present

## 2020-12-04 DIAGNOSIS — E7800 Pure hypercholesterolemia, unspecified: Secondary | ICD-10-CM | POA: Diagnosis not present

## 2020-12-04 DIAGNOSIS — I10 Essential (primary) hypertension: Secondary | ICD-10-CM | POA: Diagnosis not present

## 2020-12-05 ENCOUNTER — Other Ambulatory Visit: Payer: Self-pay

## 2020-12-05 ENCOUNTER — Ambulatory Visit (INDEPENDENT_AMBULATORY_CARE_PROVIDER_SITE_OTHER): Payer: Medicare Other | Admitting: *Deleted

## 2020-12-05 DIAGNOSIS — Z5181 Encounter for therapeutic drug level monitoring: Secondary | ICD-10-CM

## 2020-12-05 DIAGNOSIS — I4821 Permanent atrial fibrillation: Secondary | ICD-10-CM

## 2020-12-05 LAB — POCT INR: INR: 3.2 — AB (ref 2.0–3.0)

## 2020-12-05 NOTE — Patient Instructions (Signed)
Decrease warfarin to 1 1/2 daily except 1 tablet on Mondays and Thursdays Recheck in 4 wks

## 2021-01-02 ENCOUNTER — Ambulatory Visit (INDEPENDENT_AMBULATORY_CARE_PROVIDER_SITE_OTHER): Payer: Medicare Other | Admitting: *Deleted

## 2021-01-02 ENCOUNTER — Ambulatory Visit (INDEPENDENT_AMBULATORY_CARE_PROVIDER_SITE_OTHER): Payer: Medicare Other

## 2021-01-02 DIAGNOSIS — Z5181 Encounter for therapeutic drug level monitoring: Secondary | ICD-10-CM

## 2021-01-02 DIAGNOSIS — I350 Nonrheumatic aortic (valve) stenosis: Secondary | ICD-10-CM

## 2021-01-02 DIAGNOSIS — I4821 Permanent atrial fibrillation: Secondary | ICD-10-CM

## 2021-01-02 LAB — ECHOCARDIOGRAM COMPLETE
AR max vel: 2.16 cm2
AV Area VTI: 2.13 cm2
AV Area mean vel: 2.01 cm2
AV Mean grad: 12 mmHg
AV Peak grad: 21 mmHg
AV Vena cont: 0.54 cm
Ao pk vel: 2.29 m/s
Area-P 1/2: 4.21 cm2
Calc EF: 58.7 %
P 1/2 time: 371 msec
S' Lateral: 3.75 cm
Single Plane A2C EF: 59.4 %
Single Plane A4C EF: 58 %

## 2021-01-02 LAB — POCT INR: INR: 2.3 (ref 2.0–3.0)

## 2021-01-02 MED ORDER — WARFARIN SODIUM 5 MG PO TABS: ORAL_TABLET | ORAL | 3 refills | Status: DC

## 2021-01-02 NOTE — Patient Instructions (Signed)
Continue warfarin 1 1/2 daily except 1 tablet on Mondays and Thursdays Recheck in 4 wks

## 2021-01-03 ENCOUNTER — Encounter: Payer: Self-pay | Admitting: Cardiology

## 2021-01-03 DIAGNOSIS — Z7189 Other specified counseling: Secondary | ICD-10-CM | POA: Diagnosis not present

## 2021-01-03 DIAGNOSIS — Z1339 Encounter for screening examination for other mental health and behavioral disorders: Secondary | ICD-10-CM | POA: Diagnosis not present

## 2021-01-03 DIAGNOSIS — Z0000 Encounter for general adult medical examination without abnormal findings: Secondary | ICD-10-CM | POA: Diagnosis not present

## 2021-01-03 DIAGNOSIS — Z125 Encounter for screening for malignant neoplasm of prostate: Secondary | ICD-10-CM | POA: Diagnosis not present

## 2021-01-03 DIAGNOSIS — Z1331 Encounter for screening for depression: Secondary | ICD-10-CM | POA: Diagnosis not present

## 2021-01-03 DIAGNOSIS — Z79899 Other long term (current) drug therapy: Secondary | ICD-10-CM | POA: Diagnosis not present

## 2021-01-03 DIAGNOSIS — I10 Essential (primary) hypertension: Secondary | ICD-10-CM | POA: Diagnosis not present

## 2021-01-03 DIAGNOSIS — Z6841 Body Mass Index (BMI) 40.0 and over, adult: Secondary | ICD-10-CM | POA: Diagnosis not present

## 2021-01-03 DIAGNOSIS — R5383 Other fatigue: Secondary | ICD-10-CM | POA: Diagnosis not present

## 2021-01-03 DIAGNOSIS — Z299 Encounter for prophylactic measures, unspecified: Secondary | ICD-10-CM | POA: Diagnosis not present

## 2021-01-03 DIAGNOSIS — E7800 Pure hypercholesterolemia, unspecified: Secondary | ICD-10-CM | POA: Diagnosis not present

## 2021-01-03 DIAGNOSIS — Z23 Encounter for immunization: Secondary | ICD-10-CM | POA: Diagnosis not present

## 2021-01-07 ENCOUNTER — Telehealth: Payer: Self-pay | Admitting: *Deleted

## 2021-01-07 NOTE — Telephone Encounter (Signed)
Patient informed. Copy sent to PCP °

## 2021-01-07 NOTE — Telephone Encounter (Signed)
-----   Message from Satira Sark, MD sent at 01/02/2021  5:29 PM EDT ----- Results reviewed.  LVEF normal at 55 to 60%.  Aortic valve calcified with mild aortic stenosis and mild to moderate aortic regurgitation.  Keep follow-up as planned.

## 2021-01-15 DIAGNOSIS — Z299 Encounter for prophylactic measures, unspecified: Secondary | ICD-10-CM | POA: Diagnosis not present

## 2021-01-15 DIAGNOSIS — E1165 Type 2 diabetes mellitus with hyperglycemia: Secondary | ICD-10-CM | POA: Diagnosis not present

## 2021-01-15 DIAGNOSIS — Z6841 Body Mass Index (BMI) 40.0 and over, adult: Secondary | ICD-10-CM | POA: Diagnosis not present

## 2021-01-15 DIAGNOSIS — M545 Low back pain, unspecified: Secondary | ICD-10-CM | POA: Diagnosis not present

## 2021-01-15 DIAGNOSIS — Z87891 Personal history of nicotine dependence: Secondary | ICD-10-CM | POA: Diagnosis not present

## 2021-01-29 ENCOUNTER — Encounter: Payer: Self-pay | Admitting: Urology

## 2021-01-29 ENCOUNTER — Ambulatory Visit (INDEPENDENT_AMBULATORY_CARE_PROVIDER_SITE_OTHER): Payer: Medicare Other | Admitting: Urology

## 2021-01-29 ENCOUNTER — Other Ambulatory Visit: Payer: Self-pay

## 2021-01-29 VITALS — BP 164/54 | HR 66

## 2021-01-29 DIAGNOSIS — R3915 Urgency of urination: Secondary | ICD-10-CM | POA: Diagnosis not present

## 2021-01-29 DIAGNOSIS — N400 Enlarged prostate without lower urinary tract symptoms: Secondary | ICD-10-CM | POA: Diagnosis not present

## 2021-01-29 LAB — MICROSCOPIC EXAMINATION
Bacteria, UA: NONE SEEN
RBC, Urine: NONE SEEN /hpf (ref 0–2)
Renal Epithel, UA: NONE SEEN /hpf
WBC, UA: NONE SEEN /hpf (ref 0–5)

## 2021-01-29 LAB — URINALYSIS, ROUTINE W REFLEX MICROSCOPIC
Bilirubin, UA: NEGATIVE
Glucose, UA: NEGATIVE
Ketones, UA: NEGATIVE
Leukocytes,UA: NEGATIVE
Nitrite, UA: NEGATIVE
RBC, UA: NEGATIVE
Specific Gravity, UA: 1.015 (ref 1.005–1.030)
Urobilinogen, Ur: 0.2 mg/dL (ref 0.2–1.0)
pH, UA: 7 (ref 5.0–7.5)

## 2021-01-29 LAB — BLADDER SCAN AMB NON-IMAGING: Scan Result: 2

## 2021-01-29 MED ORDER — FINASTERIDE 5 MG PO TABS: 5.0000 mg | ORAL_TABLET | Freq: Every morning | ORAL | 3 refills | Status: DC

## 2021-01-29 NOTE — Progress Notes (Signed)
01/29/2021 1:41 PM   [name] 1934/07/14 [id number]  Referring provider: [name], MD [address] [place],  [place] [address]  Urinary urgency   HPI: Mr Schubach is a 86yo here for evaluation of BPh with urinary urgency. He has a hx of TURP 15 years ago with Dr. Clide Deutscher. Urine stream strong. No urinary hesitancy. He has urinary urgency which he control with timed voiding. No urge incontinence. No hematuria or dysuria. IPSS 10 QOL 0. He is currently on finasteride 5mg  daily. He previously saw Dr. Junious Silk at [facility]. No other complaints    PMH: Past Medical History:  Diagnosis Date   Atrial fibrillation ([place])    Chronic lymphocytic leukemia ([place])    Coronary atherosclerosis of native coronary artery    Nonobstructive   Essential hypertension    Hyperlipidemia    IgA nephropathy    Type 2 diabetes mellitus ([place])    UTI (urinary tract infection)     Surgical History: Past Surgical History:  Procedure Laterality Date   AMPUTATION Left 09/15/2012   Procedure: PARTIAL AMPUTATION 3rd TOE LEFT FOOT;  Surgeon: Marcheta Grammes, DPM;  [facility];  Service: Orthopedics;  Laterality: Left;   AMPUTATION Left 04/20/2013   Procedure: PARTIAL AMPUTATION 2ND TOE LEFT FOOT;  Surgeon: Marcheta Grammes, DPM;  [facility];  Service: Orthopedics;  Laterality: Left;   ANAL FISSURE REPAIR     APPLICATION OF WOUND VAC Right 09/09/2016   Procedure: APPLICATION OF WOUND VAC;  Surgeon: Caprice Beaver, DPM;  [facility];  Service: Podiatry;  Laterality: Right;   CATARACT EXTRACTION W/PHACO Right 09/30/2015   Procedure: CATARACT EXTRACTION PHACO AND INTRAOCULAR LENS PLACEMENT (IOC);  Surgeon: Tonny Branch, MD;  [facility];  Service: Ophthalmology;  Laterality: Right;  CDE: 11.78   CATARACT EXTRACTION W/PHACO Left 10/31/2015   Procedure: CATARACT EXTRACTION PHACO AND INTRAOCULAR LENS PLACEMENT LEFT EYE; CDE:  9.10;  Surgeon: Tonny Branch, MD;  [facility];   Service: Ophthalmology;  Laterality: Left;   COLONOSCOPY  08/19/2011   Procedure: COLONOSCOPY;  Surgeon: Rogene Houston, MD;  [facility];  Service: Endoscopy;  Laterality: N/A;  930   COLONOSCOPY N/A 09/19/2014   Procedure: COLONOSCOPY;  Surgeon: Rogene Houston, MD;  [facility];  Service: Endoscopy;  Laterality: N/A;  1055   FEMORAL HERNIA REPAIR     INCISION AND DRAINAGE Right 09/09/2016   Procedure: DEBRIDEMENT WOUND RT heel with wound vac attachment;  Surgeon: Caprice Beaver, DPM;  [facility]: [facility];  Service: Podiatry;  Laterality: Right;   INCISION AND DRAINAGE ABSCESS Right 02/16/2018   Procedure: INCISION AND DRAINAGE ABSCESS;  Surgeon: Leta Baptist, MD;  Location: [place];  Service: ENT;  Laterality: Right;   INCISION AND DRAINAGE OF WOUND Right 08/12/2016   Procedure: DEBRIDEMENT WOUND RIGHT HEEL;  Surgeon: Caprice Beaver, DPM;  [facility];  Service: Podiatry;  Laterality: Right;  right heel   MASS EXCISION Right 09/28/2017   Procedure: EXCISION OF EXTERNAL AUDITORY CANAL MASS;  Surgeon: Leta Baptist, MD;  Location: [place];  Service: ENT;  Laterality: Right;   MASTOIDECTOMY Right 02/16/2018   Procedure: REVISION MASTOIDECTOMY;  Surgeon: Leta Baptist, MD;  Location: [place];  Service: ENT;  Laterality: Right;   Open repair left quadriceps tendon.  08/22/07   Dr. Aline Brochure   Right total knee replacement     TRANSURETHRAL RESECTION OF PROSTATE     TYMPANOMASTOIDECTOMY Right 11/10/2017  Procedure: RIGHT TYMPANOMASTOIDECTOMY;  Surgeon: Leta Baptist, MD;  Location: [place];  Service: ENT;  Laterality: Right;   WOUND EXPLORATION Right 08/12/2016   Procedure: EXPLORATION OF WOUND FOR FOREIGN BODY RIGHT HEEL;  Surgeon: Caprice Beaver, DPM;  [facility];  Service: Podiatry;  Laterality: Right;  right heel    Home Medications:  Allergies as of 01/29/2021       Reactions   Erythromycin Other (See Comments)   Penicillins Hives, Other  (See Comments)   Has patient had a PCN reaction causing immediate rash, facial/tongue/throat swelling, SOB or lightheadedness with hypotension: No Has patient had a PCN reaction causing severe rash involving mucus membranes or skin necrosis: No Has patient had a PCN reaction that required hospitalization No Has patient had a PCN reaction occurring within the last 10 years: No If all of the above answers are "NO", then may proceed with Cephalosporin use. Has tolerated Rocephin        Medication List        Accurate as of January 29, 2021  1:41 PM. If you have any questions, ask your nurse or doctor.          Accu-Chek SmartView test strip Generic drug: glucose blood USE 1 STRIP TO CHECK GLUCOSE THREE TIMES DAILY FOR FLUCTUATING BLOOD SUGAR   amLODipine 10 MG tablet Commonly known as: NORVASC Take 10 mg by mouth daily.   atorvastatin 10 MG tablet Commonly known as: LIPITOR Take 10 mg by mouth every evening.   Calcium Carb-Cholecalciferol 600-800 MG-UNIT Tabs Take 2 tablets by mouth daily with breakfast.   cholecalciferol 1000 units tablet Commonly known as: VITAMIN D Take 1,000 Units by mouth every morning.   ciclopirox 0.77 % cream Commonly known as: LOPROX Apply 1 application topically every morning. APPLY TO THE FEET   Co Q-10 100 MG Caps Take 100 mg by mouth daily with breakfast.   digoxin 0.125 MG tablet Commonly known as: LANOXIN Take 0.125 mg by mouth daily with breakfast.   finasteride 5 MG tablet Commonly known as: PROSCAR Take 5 mg by mouth every morning.   Fish Oil 1200 MG Caps Take 2 capsules by mouth 2 (two) times daily.   furosemide 20 MG tablet Commonly known as: LASIX Take 40 mg by mouth daily.   furosemide 40 MG tablet Commonly known as: LASIX Take 40 mg by mouth daily.   HumaLOG KwikPen 100 UNIT/ML KwikPen Generic drug: insulin lispro Inject 1-9 Units into the skin 3 (three) times daily. CBG 121 - 150: 1 unit,  CBG 151 - 200: 2  units,  CBG 201 - 250: 3 units,  CBG 251 - 300: 5 units,  CBG 301 - 350: 7 units,  CBG 351 - 400: 9 units   insulin detemir 100 UNIT/ML FlexPen Commonly known as: Levemir FlexTouch Inject 20 Units into the skin daily. What changed: how much to take   lisinopril 20 MG tablet Commonly known as: ZESTRIL Take 40 mg by mouth daily with supper.   LORazepam 1 MG tablet Commonly known as: ATIVAN Take 1 mg by mouth 2 (two) times daily as needed.   multivitamin with minerals Tabs tablet Take 1 tablet by mouth daily with breakfast.   pioglitazone 15 MG tablet Commonly known as: ACTOS Take 7.5 mg by mouth every evening.   polyethylene glycol 17 g packet Commonly known as: MIRALAX / GLYCOLAX Take 17 g by mouth daily.   potassium chloride SA 20 MEQ tablet Commonly known as: KLOR-CON  Take 40 mEq by mouth daily. Take starting 01/07/2018   Victoza 18 MG/3ML Sopn Generic drug: liraglutide Inject 1.8 mg into the skin daily with breakfast.   vitamin C 1000 MG tablet Take 1,000 mg by mouth daily with breakfast.   vitamin E 180 MG (400 UNITS) capsule Take 400 Units by mouth every morning.   warfarin 5 MG tablet Commonly known as: COUMADIN Take as directed by the anticoagulation clinic. If you are unsure how to take this medication, talk to your nurse or doctor. Original instructions: TAKE 1 & 1/2 (ONE & ONE-HALF) TABLETS BY MOUTH ONCE DAILY EXCEPT 1 TABLET ON WEDNESDAYS AND SATURDAYS OR  AS  DIRECTED  BY  ANTICOAGULATION  CLINIC        Allergies:  Allergies  Allergen Reactions   Erythromycin Other (See Comments)   Penicillins Hives and Other (See Comments)    Has patient had a PCN reaction causing immediate rash, facial/tongue/throat swelling, SOB or lightheadedness with hypotension: No Has patient had a PCN reaction causing severe rash involving mucus membranes or skin necrosis: No Has patient had a PCN reaction that required hospitalization No Has patient had a PCN reaction  occurring within the last 10 years: No If all of the above answers are "NO", then may proceed with Cephalosporin use.  Has tolerated Rocephin    Family History: Family History  Problem Relation Age of Onset   Atrial fibrillation Mother     Social History:  reports that he quit smoking about 57 years ago. His smoking use included cigarettes. He has never used smokeless tobacco. He reports that he does not drink alcohol and does not use drugs.  ROS: All other review of systems were reviewed and are negative except what is noted above in HPI  Physical Exam: BP (!) 164/54   Pulse 66   Constitutional:  Alert and oriented, No acute distress. HEENT: [place] AT, moist mucus membranes.  Trachea midline, no masses. Cardiovascular: No clubbing, cyanosis, or edema. Respiratory: Normal respiratory effort, no increased work of breathing. GI: Abdomen is soft, nontender, nondistended, no abdominal masses GU: No CVA tenderness.   Lymph: No cervical or inguinal lymphadenopathy. Skin: No rashes, bruises or suspicious lesions. Neurologic: Grossly intact, no focal deficits, moving all 4 extremities. Psychiatric: Normal mood and affect.  Laboratory Data: Lab Results  Component Value Date   WBC 11.1 (H) 03/26/2018   HGB 10.7 (L) 03/26/2018   HCT 34.1 (L) 03/26/2018   MCV 90.2 03/26/2018   PLT 153 03/26/2018    Lab Results  Component Value Date   CREATININE 0.76 03/31/2018    No results found for: PSA  No results found for: TESTOSTERONE  Lab Results  Component Value Date   HGBA1C 6.0 (H) 03/31/2018    Urinalysis    Component Value Date/Time   COLORURINE YELLOW 12/14/2017 0050   APPEARANCEUR CLEAR 12/14/2017 0050   LABSPEC 1.008 12/14/2017 0050   PHURINE 6.0 12/14/2017 0050   GLUCOSEU NEGATIVE 12/14/2017 0050   HGBUR NEGATIVE 12/14/2017 0050   BILIRUBINUR NEGATIVE 12/14/2017 0050   KETONESUR NEGATIVE 12/14/2017 0050   PROTEINUR NEGATIVE 12/14/2017 0050   NITRITE NEGATIVE  12/14/2017 0050   LEUKOCYTESUR NEGATIVE 12/14/2017 0050    Lab Results  Component Value Date   BACTERIA NONE SEEN 09/17/2016    Pertinent Imaging:  No results found for this or any previous visit.  No results found for this or any previous visit.  No results found for this or any previous visit.  No  results found for this or any previous visit.  Results for orders placed during the hospital encounter of 06/20/16  US Renal  Narrative CLINICAL DATA:  Gross hematuria and urinary tract infections  EXAM: RENAL / URINARY TRACT ULTRASOUND COMPLETE  COMPARISON:  None.  FINDINGS: Right Kidney:  Length: 13.6 cm. Echogenicity and renal cortical thickness are within normal limits. No perinephric fluid or hydronephrosis visualized. There is a cyst arising from the upper pole the right kidney measuring 5.3 x 3.9 x 4.3 cm. A cyst arising from the lower pole of the right kidney measures 2.3 x 2.6 x 2.7 cm. No sonographically demonstrable calculus or ureterectasis.  Left Kidney:  Length: 13.2 cm. Echogenicity and renal cortical thickness are within normal limits. No perinephric fluid or hydronephrosis visualized. There is a cyst arising from the upper pole of the left kidney measuring 2.6 x 2.4 x 3.7 cm. There is a nearby cyst measuring 1.9 x 1.7 x 1.7 cm. No sonographically demonstrable calculus or ureterectasis.  Bladder:  There is irregular debris noted in the urinary bladder. Urinary bladder wall appears mildly thickened. Flow from the distal ureters is seen in the bladder. The patient was unable to void.  IMPRESSION: Complex debris noted within the urinary bladder with mild urinary bladder wall thickening. Suspect cystitis. Note that patient was unable to void; a finding concerning for bladder outlet obstruction. Etiology for bladder outlet obstruction uncertain.  Cysts noted in each kidney.  No obstructing focus in either kidney.   Electronically Signed By:  Lowella Grip III M.D. On: 06/23/2016 11:18  No results found for this or any previous visit.  No results found for this or any previous visit.  No results found for this or any previous visit.   Assessment & Plan:    1. Benign prostatic hyperplasia, unspecified whether lower urinary tract symptoms present =continue finasteride 5mg  daily - Urinalysis, Routine w reflex microscopic - BLADDER SCAN AMB NON-IMAGING  2. Urinary urgency -Continue timed voiding. Continue finasteride 5mg  daily   No follow-ups on file.  Nicolette Bang, MD  [facility] [place]

## 2021-01-29 NOTE — Patient Instructions (Signed)
Benign Prostatic Hyperplasia Benign prostatic hyperplasia (BPH) is an enlarged prostate gland that is caused by the normal aging process and not by cancer. The prostate is a walnut-sized gland that is involved in the production of semen. It is located in front of the rectum and below the bladder. The bladder stores urine and the urethra is the tube that carries the urine out of the body. The prostate may get bigger as a man gets older. An enlarged prostate can press on the urethra. This can make it harder to pass urine. The build-up of urine in the bladder can cause infection. Back pressure and infection may progress to bladder damage and kidney (renal) failure. What are the causes? This condition is part of a normal aging process. However, not all men develop problems from this condition. If the prostate enlarges away from the urethra, urine flow will not be blocked. If it enlarges toward the urethra and compresses it, there will be problems passing urine. What increases the risk? This condition is more likely to develop in men over the age of 50 years. What are the signs or symptoms? Symptoms of this condition include: Getting up often during the night to urinate. Needing to urinate frequently during the day. Difficulty starting urine flow. Decrease in size and strength of your urine stream. Leaking (dribbling) after urinating. Inability to pass urine. This needs immediate treatment. Inability to completely empty your bladder. Pain when you pass urine. This is more common if there is also an infection. Urinary tract infection (UTI). How is this diagnosed? This condition is diagnosed based on your medical history, a physical exam, and your symptoms. Tests will also be done, such as: A post-void bladder scan. This measures any amount of urine that may remain in your bladder after you finish urinating. A digital rectal exam. In a rectal exam, your health care provider checks your prostate by  putting a lubricated, gloved finger into your rectum to feel the back of your prostate gland. This exam detects the size of your gland and any abnormal lumps or growths. An exam of your urine (urinalysis). A prostate specific antigen (PSA) screening. This is a blood test used to screen for prostate cancer. An ultrasound. This test uses sound waves to electronically produce a picture of your prostate gland. Your health care provider may refer you to a specialist in kidney and prostate diseases (urologist). How is this treated? Once symptoms begin, your health care provider will monitor your condition (active surveillance or watchful waiting). Treatment for this condition will depend on the severity of your condition. Treatment may include: Observation and yearly exams. This may be the only treatment needed if your condition and symptoms are mild. Medicines to relieve your symptoms, including: Medicines to shrink the prostate. Medicines to relax the muscle of the prostate. Surgery in severe cases. Surgery may include: Prostatectomy. In this procedure, the prostate tissue is removed completely through an open incision or with a laparoscope or robotics. Transurethral resection of the prostate (TURP). In this procedure, a tool is inserted through the opening at the tip of the penis (urethra). It is used to cut away tissue of the inner core of the prostate. The pieces are removed through the same opening of the penis. This removes the blockage. Transurethral incision (TUIP). In this procedure, small cuts are made in the prostate. This lessens the prostate's pressure on the urethra. Transurethral microwave thermotherapy (TUMT). This procedure uses microwaves to create heat. The heat destroys and removes a   small amount of prostate tissue. Transurethral needle ablation (TUNA). This procedure uses radio frequencies to destroy and remove a small amount of prostate tissue. Interstitial laser coagulation (ILC).  This procedure uses a laser to destroy and remove a small amount of prostate tissue. Transurethral electrovaporization (TUVP). This procedure uses electrodes to destroy and remove a small amount of prostate tissue. Prostatic urethral lift. This procedure inserts an implant to push the lobes of the prostate away from the urethra. Follow these instructions at home: Take over-the-counter and prescription medicines only as told by your health care provider. Monitor your symptoms for any changes. Contact your health care provider with any changes. Avoid drinking large amounts of liquid before going to bed or out in public. Avoid or reduce how much caffeine or alcohol you drink. Give yourself time when you urinate. Keep all follow-up visits as told by your health care provider. This is important. Contact a health care provider if: You have unexplained back pain. Your symptoms do not get better with treatment. You develop side effects from the medicine you are taking. Your urine becomes very dark or has a bad smell. Your lower abdomen becomes distended and you have trouble passing your urine. Get help right away if: You have a fever or chills. You suddenly cannot urinate. You feel lightheaded, or very dizzy, or you faint. There are large amounts of blood or clots in the urine. Your urinary problems become hard to manage. You develop moderate to severe low back or flank pain. The flank is the side of your body between the ribs and the hip. These symptoms may represent a serious problem that is an emergency. Do not wait to see if the symptoms will go away. Get medical help right away. Call your local emergency services (911 in the [place] Do not drive yourself to the hospital. Summary Benign prostatic hyperplasia (BPH) is an enlarged prostate that is caused by the normal aging process and not by cancer. An enlarged prostate can press on the urethra. This can make it hard to pass urine. This  condition is part of a normal aging process and is more likely to develop in men over the age of 50 years. Get help right away if you suddenly cannot urinate. This information is not intended to replace advice given to you by your health care provider. Make sure you discuss any questions you have with your health care provider. Document Revised: 07/03/2020 Document Reviewed: 11/30/2019 Elsevier Patient Education  2022 [employer]  

## 2021-01-29 NOTE — Progress Notes (Signed)
post void residual=2  Urological Symptom Review  Patient is experiencing the following symptoms: Frequent urination Hard to postpone urination Leakage of urine Stream starts and stops Erection problems (male only) Kidney stones   Review of Systems  Gastrointestinal (upper)  : Negative for upper GI symptoms  Gastrointestinal (lower) : Negative for lower GI symptoms  Constitutional : Negative for symptoms  Skin: Negative for skin symptoms  Eyes: Negative for eye symptoms  Ear/Nose/Throat : Negative for Ear/Nose/Throat symptoms  Hematologic/Lymphatic: Easy bruising  Cardiovascular : Leg swelling  Respiratory : Negative for respiratory symptoms  Endocrine: Negative for endocrine symptoms  Musculoskeletal: Negative for musculoskeletal symptoms  Neurological: Negative for neurological symptoms  Psychologic: Negative for psychiatric symptoms

## 2021-02-04 DIAGNOSIS — S90819A Abrasion, unspecified foot, initial encounter: Secondary | ICD-10-CM | POA: Diagnosis not present

## 2021-02-04 DIAGNOSIS — E1142 Type 2 diabetes mellitus with diabetic polyneuropathy: Secondary | ICD-10-CM | POA: Diagnosis not present

## 2021-02-05 ENCOUNTER — Ambulatory Visit (INDEPENDENT_AMBULATORY_CARE_PROVIDER_SITE_OTHER): Payer: Medicare Other | Admitting: *Deleted

## 2021-02-05 DIAGNOSIS — I4821 Permanent atrial fibrillation: Secondary | ICD-10-CM | POA: Diagnosis not present

## 2021-02-05 DIAGNOSIS — Z5181 Encounter for therapeutic drug level monitoring: Secondary | ICD-10-CM | POA: Diagnosis not present

## 2021-02-05 LAB — POCT INR: INR: 2.1 (ref 2.0–3.0)

## 2021-02-05 NOTE — Patient Instructions (Signed)
Continue warfarin 1 1/2 daily except 1 tablet on Mondays and Thursdays Recheck in 5 wks

## 2021-02-13 DIAGNOSIS — Z20822 Contact with and (suspected) exposure to covid-19: Secondary | ICD-10-CM | POA: Diagnosis not present

## 2021-02-14 DIAGNOSIS — L309 Dermatitis, unspecified: Secondary | ICD-10-CM | POA: Diagnosis not present

## 2021-02-21 DIAGNOSIS — L309 Dermatitis, unspecified: Secondary | ICD-10-CM | POA: Diagnosis not present

## 2021-02-24 NOTE — Progress Notes (Signed)
Cardiology Office Note  Date: 02/25/2021   ID: [name] [name], DOB [birth year], MRN [id number]  PCP:  [name], MD  Cardiologist:  [name], MD Electrophysiologist:  None   Chief Complaint  Patient presents with   Cardiac follow-up    History of Present Illness: [name] [name] is an 86 y.o. male last seen in March.  He is here for a routine visit.  He does not describe any sense of palpitations or chest pain with typical activities.  He continues on Coumadin with follow-up in the anticoagulation clinic.  Last INR was 2.1.  No reported spontaneous bleeding problems.  I personally reviewed his ECG today which shows rate controlled atrial fibrillation.  He remains on Lanoxin.  Follow-up echocardiogram in September revealed LVEF 55 to 60% range with mild to moderate aortic regurgitation and mild aortic stenosis, mean gradient 12 mmHg.  We went over these results again today.  Requesting interval lab work from Dr. Manuella Ghazi.  Past Medical History:  Diagnosis Date   Atrial fibrillation ([place])    Chronic lymphocytic leukemia ([place])    Coronary atherosclerosis of native coronary artery    Nonobstructive   Essential hypertension    Hyperlipidemia    IgA nephropathy    Type 2 diabetes mellitus ([place])    UTI (urinary tract infection)     Past Surgical History:  Procedure Laterality Date   AMPUTATION Left 09/15/2012   Procedure: PARTIAL AMPUTATION 3rd TOE LEFT FOOT;  Surgeon: Marcheta Grammes, DPM;  Location: [facility];  Service: Orthopedics;  Laterality: Left;   AMPUTATION Left 04/20/2013   Procedure: PARTIAL AMPUTATION 2ND TOE LEFT FOOT;  Surgeon: Marcheta Grammes, DPM;  Location: [facility];  Service: Orthopedics;  Laterality: Left;   ANAL FISSURE REPAIR     APPLICATION OF WOUND VAC Right 09/09/2016   Procedure: APPLICATION OF WOUND VAC;  Surgeon: Caprice Beaver, DPM;  Location: [facility];  Service: Podiatry;  Laterality: Right;   CATARACT EXTRACTION W/PHACO Right 09/30/2015    Procedure: CATARACT EXTRACTION PHACO AND INTRAOCULAR LENS PLACEMENT (IOC);  Surgeon: Tonny Branch, MD;  Location: [facility];  Service: Ophthalmology;  Laterality: Right;  CDE: 11.78   CATARACT EXTRACTION W/PHACO Left 10/31/2015   Procedure: CATARACT EXTRACTION PHACO AND INTRAOCULAR LENS PLACEMENT LEFT EYE; CDE:  9.10;  Surgeon: Tonny Branch, MD;  Location: [facility];  Service: Ophthalmology;  Laterality: Left;   COLONOSCOPY  08/19/2011   Procedure: COLONOSCOPY;  Surgeon: Rogene Houston, MD;  [facility];  Service: Endoscopy;  Laterality: N/A;  930   COLONOSCOPY N/A 09/19/2014   Procedure: COLONOSCOPY;  Surgeon: Rogene Houston, MD;  [facility];  Service: Endoscopy;  Laterality: N/A;  1055   FEMORAL HERNIA REPAIR     INCISION AND DRAINAGE Right 09/09/2016   Procedure: DEBRIDEMENT WOUND RT heel with wound vac attachment;  Surgeon: Caprice Beaver, DPM;  Location: [facility];  Service: Podiatry;  Laterality: Right;   INCISION AND DRAINAGE ABSCESS Right 02/16/2018   Procedure: INCISION AND DRAINAGE ABSCESS;  Surgeon: Leta Baptist, MD;  Location: [facility];  Service: ENT;  Laterality: Right;   INCISION AND DRAINAGE OF WOUND Right 08/12/2016   Procedure: DEBRIDEMENT WOUND RIGHT HEEL;  Surgeon: Caprice Beaver, DPM;  Location: [facility];  Service: Podiatry;  Laterality: Right;  right heel   MASS EXCISION Right 09/28/2017   Procedure: EXCISION OF EXTERNAL AUDITORY CANAL MASS;  Surgeon: Leta Baptist, MD;  Location: [facility];  Service: ENT;  Laterality: Right;   MASTOIDECTOMY Right 02/16/2018   Procedure: REVISION MASTOIDECTOMY;  Surgeon: Leta Baptist, MD;  Location: [facility];  Service: ENT;  Laterality: Right;   Open repair left quadriceps tendon.  08/22/07   Dr. Aline Brochure   Right total knee replacement     TRANSURETHRAL RESECTION OF PROSTATE     TYMPANOMASTOIDECTOMY Right 11/10/2017   Procedure: RIGHT TYMPANOMASTOIDECTOMY;  Surgeon: Leta Baptist, MD;  Location: [facility];  Service:  ENT;  Laterality: Right;   WOUND EXPLORATION Right 08/12/2016   Procedure: EXPLORATION OF WOUND FOR FOREIGN BODY RIGHT HEEL;  Surgeon: Caprice Beaver, DPM;  Location: [facility];  Service: Podiatry;  Laterality: Right;  right heel    Current Outpatient Medications  Medication Sig Dispense Refill   ACCU-CHEK SMARTVIEW test strip USE 1 STRIP TO CHECK GLUCOSE THREE TIMES DAILY FOR FLUCTUATING BLOOD SUGAR     amLODipine (NORVASC) 10 MG tablet Take 10 mg by mouth daily.     Ascorbic Acid (VITAMIN C) 1000 MG tablet Take 1,000 mg by mouth daily with breakfast.     atorvastatin (LIPITOR) 10 MG tablet Take 10 mg by mouth every evening.     Calcium Carb-Cholecalciferol 600-800 MG-UNIT TABS Take 2 tablets by mouth daily with breakfast.     cholecalciferol (VITAMIN D) 1000 units tablet Take 1,000 Units by mouth every morning.     ciclopirox (LOPROX) 0.77 % cream Apply 1 application topically every morning. APPLY TO THE FEET     Coenzyme Q10 (CO Q-10) 100 MG CAPS Take 100 mg by mouth daily with breakfast.      digoxin (LANOXIN) 0.125 MG tablet Take 0.125 mg by mouth daily with breakfast.      finasteride (PROSCAR) 5 MG tablet Take 1 tablet (5 mg total) by mouth every morning. 90 tablet 3   furosemide (LASIX) 20 MG tablet Take 40 mg by mouth daily.      furosemide (LASIX) 40 MG tablet Take 40 mg by mouth daily.     HUMALOG KWIKPEN 100 UNIT/ML KwikPen Inject 1-9 Units into the skin 3 (three) times daily. CBG 121 - 150: 1 unit,  CBG 151 - 200: 2 units,  CBG 201 - 250: 3 units,  CBG 251 - 300: 5 units,  CBG 301 - 350: 7 units,  CBG 351 - 400: 9 units     Insulin Detemir (LEVEMIR FLEXTOUCH) 100 UNIT/ML Pen Inject 20 Units into the skin daily. (Patient taking differently: Inject 60 Units into the skin daily.)     lisinopril (PRINIVIL,ZESTRIL) 20 MG tablet Take 40 mg by mouth daily with supper.     LORazepam (ATIVAN) 1 MG tablet Take 1 mg by mouth 2 (two) times daily as needed.     Multiple Vitamin (MULTIVITAMIN  WITH MINERALS) TABS tablet Take 1 tablet by mouth daily with breakfast.     Omega-3 Fatty Acids (FISH OIL) 1200 MG CAPS Take 2 capsules by mouth 2 (two) times daily.     pioglitazone (ACTOS) 15 MG tablet Take 7.5 mg by mouth every evening.      polyethylene glycol (MIRALAX / GLYCOLAX) packet Take 17 g by mouth daily.     potassium chloride SA (K-DUR,KLOR-CON) 20 MEQ tablet Take 40 mEq by mouth daily. Take starting 01/07/2018     VICTOZA 18 MG/3ML SOPN Inject 1.8 mg into the skin daily with breakfast.     vitamin E 400 UNIT capsule Take 400 Units by mouth every morning.     warfarin (COUMADIN) 5  MG tablet TAKE 1 & 1/2 (ONE & ONE-HALF) TABLETS BY MOUTH ONCE DAILY EXCEPT 1 TABLET ON WEDNESDAYS AND SATURDAYS OR  AS  DIRECTED  BY  ANTICOAGULATION  CLINIC 135 tablet 3   No current facility-administered medications for this visit.   Allergies:  Erythromycin and Penicillins   ROS: Chronic hearing loss.  Physical Exam: VS:  BP 120/68   Pulse 73   Resp 18   Ht 6' (1.829 m)   Wt 269 lb (122 kg)   SpO2 94%   BMI 36.48 kg/m , BMI Body mass index is 36.48 kg/m.  Wt Readings from Last 3 Encounters:  02/25/21 269 lb (122 kg)  06/27/20 276 lb (125.2 kg)  12/21/19 264 lb (119.7 kg)    General: Patient appears comfortable at rest. HEENT: Conjunctiva and lids normal, wearing a mask. Neck: Supple, no elevated JVP or carotid bruits, no thyromegaly. Lungs: Clear to auscultation, nonlabored breathing at rest. Cardiac: Irregularly irregular, no S3, 2/6 systolic murmur, no pericardial rub. Extremities: Chronic appearing leg edema.  ECG:  An ECG dated 12/21/2019 was personally reviewed today and demonstrated:  Atrial fibrillation with right bundle branch block and left anterior fascicular block.  Recent Labwork:  September 2021: Hemoglobin 14.2, platelets 162, BUN 22, creatinine 0.87, potassium 4.4, AST 20, ALT 50, cholesterol 135, triglycerides 83, HDL 43, LDL 76, TSH 2.17 March 2020: Hemoglobin  A1c 5.7%  Other Studies Reviewed Today:  Echocardiogram 01/02/2021:  1. Left ventricular ejection fraction, by estimation, is 55 to 60%. The  left ventricle has normal function. The left ventricle has no regional  wall motion abnormalities. There is mild left ventricular hypertrophy.  Left ventricular diastolic parameters  are indeterminate.   2. Right ventricular systolic function is normal. The right ventricular  size is normal. There is moderately elevated pulmonary artery systolic  pressure.   3. Left atrial size was severely dilated.   4. Right atrial size was severely dilated.   5. The mitral valve is abnormal. Mild mitral valve regurgitation. No  evidence of mitral stenosis. Moderate mitral annular calcification.   6. The tricuspid valve is abnormal.   7. The aortic valve is tricuspid. There is moderate calcification of the  aortic valve. There is moderate thickening of the aortic valve. Aortic  valve regurgitation is mild to moderate. Mild aortic valve stenosis.  Aortic valve mean gradient measures 12.0   mmHg. Aortic valve peak gradient measures 21.0 mmHg. Aortic valve area,  by VTI measures 2.13 cm.   8. The inferior vena cava is dilated in size with >50% respiratory  variability, suggesting right atrial pressure of 8 mmHg.   Assessment and Plan:  1.  Permanent atrial fibrillation with CHA2DS2-VASc score of 5.  He is asymptomatic without palpitations and remains on Lanoxin.  Continue Coumadin for stroke prophylaxis.  Recent INR therapeutic.  No reported spontaneous bleeding problems.  2.  Mild aortic stenosis with mild to moderate aortic regurgitation.  Continue to follow clinically for now with anticipated repeat echocardiogram around September of next year.  Medication Adjustments/Labs and Tests Ordered: Current medicines are reviewed at length with the patient today.  Concerns regarding medicines are outlined above.   Tests Ordered: Orders Placed This Encounter   Procedures   EKG 12-Lead    Medication Changes: No orders of the defined types were placed in this encounter.   Disposition:  Follow up  6 months.  Signed, Satira Sark, MD, [facility] 02/25/2021 1:30 PM    [facility] [place], [place], [place] [address] Phone: [phone]; Fax: [phone]

## 2021-02-25 ENCOUNTER — Encounter: Payer: Self-pay | Admitting: Cardiology

## 2021-02-25 ENCOUNTER — Ambulatory Visit (INDEPENDENT_AMBULATORY_CARE_PROVIDER_SITE_OTHER): Payer: Medicare Other | Admitting: Cardiology

## 2021-02-25 ENCOUNTER — Encounter: Payer: Self-pay | Admitting: *Deleted

## 2021-02-25 VITALS — BP 120/68 | HR 73 | Resp 18 | Ht 72.0 in | Wt 269.0 lb

## 2021-02-25 DIAGNOSIS — I350 Nonrheumatic aortic (valve) stenosis: Secondary | ICD-10-CM | POA: Diagnosis not present

## 2021-02-25 DIAGNOSIS — I4821 Permanent atrial fibrillation: Secondary | ICD-10-CM | POA: Diagnosis not present

## 2021-02-25 NOTE — Patient Instructions (Addendum)

## 2021-03-03 ENCOUNTER — Ambulatory Visit (INDEPENDENT_AMBULATORY_CARE_PROVIDER_SITE_OTHER): Payer: Medicare Other | Admitting: Urology

## 2021-03-03 ENCOUNTER — Other Ambulatory Visit: Payer: Self-pay

## 2021-03-03 ENCOUNTER — Encounter: Payer: Self-pay | Admitting: Urology

## 2021-03-03 VITALS — BP 152/68 | HR 71 | Temp 98.5°F

## 2021-03-03 DIAGNOSIS — N400 Enlarged prostate without lower urinary tract symptoms: Secondary | ICD-10-CM

## 2021-03-03 DIAGNOSIS — R3915 Urgency of urination: Secondary | ICD-10-CM | POA: Diagnosis not present

## 2021-03-03 DIAGNOSIS — N3000 Acute cystitis without hematuria: Secondary | ICD-10-CM

## 2021-03-03 LAB — URINALYSIS, ROUTINE W REFLEX MICROSCOPIC
Bilirubin, UA: NEGATIVE
Glucose, UA: NEGATIVE
Ketones, UA: NEGATIVE
Nitrite, UA: NEGATIVE
Specific Gravity, UA: 1.01 (ref 1.005–1.030)
Urobilinogen, Ur: 1 mg/dL (ref 0.2–1.0)
pH, UA: 7 (ref 5.0–7.5)

## 2021-03-03 LAB — MICROSCOPIC EXAMINATION
Bacteria, UA: NONE SEEN
Epithelial Cells (non renal): NONE SEEN /hpf (ref 0–10)
Renal Epithel, UA: NONE SEEN /hpf

## 2021-03-03 LAB — BLADDER SCAN AMB NON-IMAGING: PVR: 248 WU

## 2021-03-03 MED ORDER — TAMSULOSIN HCL 0.4 MG PO CAPS: 0.4000 mg | ORAL_CAPSULE | Freq: Every day | ORAL | 11 refills | Status: DC

## 2021-03-03 MED ORDER — NITROFURANTOIN MONOHYD MACRO 100 MG PO CAPS: 100.0000 mg | ORAL_CAPSULE | Freq: Two times a day (BID) | ORAL | 0 refills | Status: DC

## 2021-03-03 NOTE — Patient Instructions (Signed)

## 2021-03-03 NOTE — Progress Notes (Signed)
bUrological Symptom Review  Patient is experiencing the following symptoms: Frequent urination Hard to postpone urination Get up at night to urinate Leakage of urine Urinary tract infection   Review of Systems  Gastrointestinal (upper)  : Negative for upper GI symptoms  Gastrointestinal (lower) : Negative for lower GI symptoms  Constitutional : Negative for symptoms  Skin: Negative for skin symptoms  Eyes: Negative for eye symptoms  Ear/Nose/Throat : Negative for Ear/Nose/Throat symptoms  Hematologic/Lymphatic: Negative for Hematologic/Lymphatic symptoms  Cardiovascular : Leg swelling  Respiratory : Negative for respiratory symptoms  Endocrine: Negative for endocrine symptoms  Musculoskeletal: Negative for musculoskeletal symptoms  Neurological: Negative for neurological symptoms  Psychologic: Negative for psychiatric symptoms

## 2021-03-03 NOTE — Progress Notes (Signed)
03/03/2021 12:10 PM   [name] [name] [birth year] [id number]  Referring provider: [name], MD [address] [place],  [place] [address]  Followup BPh   HPI: Mr Koerber is a 86yo here for followup for BPh and urinary urgency. He has been having worsening urgency, urinary frequency, nocturia and dysuria over the past 3-4 days. UA is concerning for infection. Urine stream is currently weak. He has straining to urinate. He has a feeling of incomplete emptying   PMH: Past Medical History:  Diagnosis Date   Atrial fibrillation ([place])    Chronic lymphocytic leukemia ([place])    Coronary atherosclerosis of native coronary artery    Nonobstructive   Essential hypertension    Hyperlipidemia    IgA nephropathy    Type 2 diabetes mellitus ([place])    UTI (urinary tract infection)     Surgical History: Past Surgical History:  Procedure Laterality Date   AMPUTATION Left 09/15/2012   Procedure: PARTIAL AMPUTATION 3rd TOE LEFT FOOT;  Surgeon: Marcheta Grammes, DPM;  Location: [facility];  Service: Orthopedics;  Laterality: Left;   AMPUTATION Left 04/20/2013   Procedure: PARTIAL AMPUTATION 2ND TOE LEFT FOOT;  Surgeon: Marcheta Grammes, DPM;  Location: [facility];  Service: Orthopedics;  Laterality: Left;   ANAL FISSURE REPAIR     APPLICATION OF WOUND VAC Right 09/09/2016   Procedure: APPLICATION OF WOUND VAC;  Surgeon: Caprice Beaver, DPM;  Location: [facility];  Service: Podiatry;  Laterality: Right;   CATARACT EXTRACTION W/PHACO Right 09/30/2015   Procedure: CATARACT EXTRACTION PHACO AND INTRAOCULAR LENS PLACEMENT (IOC);  Surgeon: Tonny Branch, MD;  Location: [facility];  Service: Ophthalmology;  Laterality: Right;  CDE: 11.78   CATARACT EXTRACTION W/PHACO Left 10/31/2015   Procedure: CATARACT EXTRACTION PHACO AND INTRAOCULAR LENS PLACEMENT LEFT EYE; CDE:  9.10;  Surgeon: Tonny Branch, MD;  Location: [facility];  Service: Ophthalmology;  Laterality: Left;   COLONOSCOPY  08/19/2011   Procedure: COLONOSCOPY;   Surgeon: Rogene Houston, MD;  [facility];  Service: Endoscopy;  Laterality: N/A;  930   COLONOSCOPY N/A 09/19/2014   Procedure: COLONOSCOPY;  Surgeon: Rogene Houston, MD;  [facility];  Service: Endoscopy;  Laterality: N/A;  1055   FEMORAL HERNIA REPAIR     INCISION AND DRAINAGE Right 09/09/2016   Procedure: DEBRIDEMENT WOUND RT heel with wound vac attachment;  Surgeon: Caprice Beaver, DPM;  Location: [facility];  Service: Podiatry;  Laterality: Right;   INCISION AND DRAINAGE ABSCESS Right 02/16/2018   Procedure: INCISION AND DRAINAGE ABSCESS;  Surgeon: Leta Baptist, MD;  Location: [facility];  Service: ENT;  Laterality: Right;   INCISION AND DRAINAGE OF WOUND Right 08/12/2016   Procedure: DEBRIDEMENT WOUND RIGHT HEEL;  Surgeon: Caprice Beaver, DPM;  Location: [facility];  Service: Podiatry;  Laterality: Right;  right heel   MASS EXCISION Right 09/28/2017   Procedure: EXCISION OF EXTERNAL AUDITORY CANAL MASS;  Surgeon: Leta Baptist, MD;  Location: [facility];  Service: ENT;  Laterality: Right;   MASTOIDECTOMY Right 02/16/2018   Procedure: REVISION MASTOIDECTOMY;  Surgeon: Leta Baptist, MD;  Location: [facility];  Service: ENT;  Laterality: Right;   Open repair left quadriceps tendon.  08/22/07   Dr. Aline Brochure   Right total knee replacement     TRANSURETHRAL RESECTION OF PROSTATE     TYMPANOMASTOIDECTOMY Right 11/10/2017   Procedure: RIGHT TYMPANOMASTOIDECTOMY;  Surgeon: Leta Baptist, MD;  Location: [facility];  Service: ENT;  Laterality: Right;  WOUND EXPLORATION Right 08/12/2016   Procedure: EXPLORATION OF WOUND FOR FOREIGN BODY RIGHT HEEL;  Surgeon: Caprice Beaver, DPM;  Location: [facility];  Service: Podiatry;  Laterality: Right;  right heel    Home Medications:  Allergies as of 03/03/2021       Reactions   Erythromycin Other (See Comments)   Penicillins Hives, Other (See Comments)   Has patient had a PCN reaction causing immediate rash, facial/tongue/throat  swelling, SOB or lightheadedness with hypotension: No Has patient had a PCN reaction causing severe rash involving mucus membranes or skin necrosis: No Has patient had a PCN reaction that required hospitalization No Has patient had a PCN reaction occurring within the last 10 years: No If all of the above answers are "NO", then may proceed with Cephalosporin use. Has tolerated Rocephin        Medication List        Accurate as of March 03, 2021 12:10 PM. If you have any questions, ask your nurse or doctor.          Accu-Chek SmartView test strip Generic drug: glucose blood USE 1 STRIP TO CHECK GLUCOSE THREE TIMES DAILY FOR FLUCTUATING BLOOD SUGAR   amLODipine 10 MG tablet Commonly known as: NORVASC Take 10 mg by mouth daily.   atorvastatin 10 MG tablet Commonly known as: LIPITOR Take 10 mg by mouth every evening.   Calcium Carb-Cholecalciferol 600-800 MG-UNIT Tabs Take 2 tablets by mouth daily with breakfast.   cholecalciferol 1000 units tablet Commonly known as: VITAMIN D Take 1,000 Units by mouth every morning.   ciclopirox 0.77 % cream Commonly known as: LOPROX Apply 1 application topically every morning. APPLY TO THE FEET   Co Q-10 100 MG Caps Take 100 mg by mouth daily with breakfast.   digoxin 0.125 MG tablet Commonly known as: LANOXIN Take 0.125 mg by mouth daily with breakfast.   finasteride 5 MG tablet Commonly known as: PROSCAR Take 1 tablet (5 mg total) by mouth every morning.   Fish Oil 1200 MG Caps Take 2 capsules by mouth 2 (two) times daily.   furosemide 20 MG tablet Commonly known as: LASIX Take 40 mg by mouth daily.   furosemide 40 MG tablet Commonly known as: LASIX Take 40 mg by mouth daily.   HumaLOG KwikPen 100 UNIT/ML KwikPen Generic drug: insulin lispro Inject 1-9 Units into the skin 3 (three) times daily. CBG 121 - 150: 1 unit,  CBG 151 - 200: 2 units,  CBG 201 - 250: 3 units,  CBG 251 - 300: 5 units,  CBG 301 - 350: 7 units,   CBG 351 - 400: 9 units   insulin detemir 100 UNIT/ML FlexPen Commonly known as: Levemir FlexTouch Inject 20 Units into the skin daily. What changed: how much to take   lisinopril 20 MG tablet Commonly known as: ZESTRIL Take 40 mg by mouth daily with supper.   LORazepam 1 MG tablet Commonly known as: ATIVAN Take 1 mg by mouth 2 (two) times daily as needed.   multivitamin with minerals Tabs tablet Take 1 tablet by mouth daily with breakfast.   nitrofurantoin (macrocrystal-monohydrate) 100 MG capsule Commonly known as: MACROBID Take 1 capsule (100 mg total) by mouth every 12 (twelve) hours. Started by: Nicolette Bang, MD   pioglitazone 15 MG tablet Commonly known as: ACTOS Take 7.5 mg by mouth every evening.   polyethylene glycol 17 g packet Commonly known as: MIRALAX / GLYCOLAX Take 17 g by mouth daily.   potassium chloride  SA 20 MEQ tablet Commonly known as: KLOR-CON Take 40 mEq by mouth daily. Take starting 01/07/2018   tamsulosin 0.4 MG Caps capsule Commonly known as: FLOMAX Take 1 capsule (0.4 mg total) by mouth daily after supper. Started by: Nicolette Bang, MD   Victoza 18 MG/3ML Sopn Generic drug: liraglutide Inject 1.8 mg into the skin daily with breakfast.   vitamin C 1000 MG tablet Take 1,000 mg by mouth daily with breakfast.   vitamin E 180 MG (400 UNITS) capsule Take 400 Units by mouth every morning.   warfarin 5 MG tablet Commonly known as: COUMADIN Take as directed by the anticoagulation clinic. If you are unsure how to take this medication, talk to your nurse or doctor. Original instructions: TAKE 1 & 1/2 (ONE & ONE-HALF) TABLETS BY MOUTH ONCE DAILY EXCEPT 1 TABLET ON WEDNESDAYS AND SATURDAYS OR  AS  DIRECTED  BY  ANTICOAGULATION  CLINIC        Allergies:  Allergies  Allergen Reactions   Erythromycin Other (See Comments)   Penicillins Hives and Other (See Comments)    Has patient had a PCN reaction causing immediate rash,  facial/tongue/throat swelling, SOB or lightheadedness with hypotension: No Has patient had a PCN reaction causing severe rash involving mucus membranes or skin necrosis: No Has patient had a PCN reaction that required hospitalization No Has patient had a PCN reaction occurring within the last 10 years: No If all of the above answers are "NO", then may proceed with Cephalosporin use.  Has tolerated Rocephin    Family History: Family History  Problem Relation Age of Onset   Atrial fibrillation Mother     Social History:  reports that he quit smoking about 57 years ago. His smoking use included cigarettes. He has never used smokeless tobacco. He reports that he does not drink alcohol and does not use drugs.  ROS: All other review of systems were reviewed and are negative except what is noted above in HPI  Physical Exam: BP (!) 152/68   Pulse 71   Temp 98.5 F (36.9 C)   Constitutional:  Alert and oriented, No acute distress. HEENT: [place] AT, moist mucus membranes.  Trachea midline, no masses. Cardiovascular: No clubbing, cyanosis, or edema. Respiratory: Normal respiratory effort, no increased work of breathing. GI: Abdomen is soft, nontender, nondistended, no abdominal masses GU: No CVA tenderness.  Lymph: No cervical or inguinal lymphadenopathy. Skin: No rashes, bruises or suspicious lesions. Neurologic: Grossly intact, no focal deficits, moving all 4 extremities. Psychiatric: Normal mood and affect.  Laboratory Data: Lab Results  Component Value Date   WBC 11.1 (H) 03/26/2018   HGB 10.7 (L) 03/26/2018   HCT 34.1 (L) 03/26/2018   MCV 90.2 03/26/2018   PLT 153 03/26/2018    Lab Results  Component Value Date   CREATININE 0.76 03/31/2018    No results found for: PSA  No results found for: TESTOSTERONE  Lab Results  Component Value Date   HGBA1C 6.0 (H) 03/31/2018    Urinalysis    Component Value Date/Time   COLORURINE YELLOW 12/14/2017 0050   APPEARANCEUR Clear  01/29/2021 1356   LABSPEC 1.008 12/14/2017 0050   PHURINE 6.0 12/14/2017 0050   GLUCOSEU Negative 01/29/2021 1356   HGBUR NEGATIVE 12/14/2017 0050   BILIRUBINUR Negative 01/29/2021 1356   KETONESUR NEGATIVE 12/14/2017 0050   PROTEINUR 1+ (A) 01/29/2021 1356   PROTEINUR NEGATIVE 12/14/2017 0050   NITRITE Negative 01/29/2021 1356   NITRITE NEGATIVE 12/14/2017 0050   LEUKOCYTESUR Negative  01/29/2021 1356    Lab Results  Component Value Date   LABMICR See below: 01/29/2021   WBCUA None seen 01/29/2021   LABEPIT 0-10 01/29/2021   MUCUS Present 01/29/2021   BACTERIA None seen 01/29/2021    Pertinent Imaging:  No results found for this or any previous visit.  No results found for this or any previous visit.  No results found for this or any previous visit.  No results found for this or any previous visit.  Results for orders placed during the hospital encounter of 06/20/16  US Renal  Narrative CLINICAL DATA:  Gross hematuria and urinary tract infections  EXAM: RENAL / URINARY TRACT ULTRASOUND COMPLETE  COMPARISON:  None.  FINDINGS: Right Kidney:  Length: 13.6 cm. Echogenicity and renal cortical thickness are within normal limits. No perinephric fluid or hydronephrosis visualized. There is a cyst arising from the upper pole the right kidney measuring 5.3 x 3.9 x 4.3 cm. A cyst arising from the lower pole of the right kidney measures 2.3 x 2.6 x 2.7 cm. No sonographically demonstrable calculus or ureterectasis.  Left Kidney:  Length: 13.2 cm. Echogenicity and renal cortical thickness are within normal limits. No perinephric fluid or hydronephrosis visualized. There is a cyst arising from the upper pole of the left kidney measuring 2.6 x 2.4 x 3.7 cm. There is a nearby cyst measuring 1.9 x 1.7 x 1.7 cm. No sonographically demonstrable calculus or ureterectasis.  Bladder:  There is irregular debris noted in the urinary bladder. Urinary bladder wall appears  mildly thickened. Flow from the distal ureters is seen in the bladder. The patient was unable to void.  IMPRESSION: Complex debris noted within the urinary bladder with mild urinary bladder wall thickening. Suspect cystitis. Note that patient was unable to void; a finding concerning for bladder outlet obstruction. Etiology for bladder outlet obstruction uncertain.  Cysts noted in each kidney.  No obstructing focus in either kidney.   Electronically Signed By: Lowella Grip III M.D. On: 06/23/2016 11:18  No results found for this or any previous visit.  No results found for this or any previous visit.  No results found for this or any previous visit.   Assessment & Plan:    1. Benign prostatic hyperplasia, unspecified whether lower urinary tract symptoms present -Flomax 0.4mg  daily - BLADDER SCAN AMB NON-IMAGING  2. Urinary urgency -Likely related to UTI - BLADDER SCAN AMB NON-IMAGING  3. Acute cystitis without hematuria Macrobid 100mg  BID for 7 days - Urine Culture   No follow-ups on file.  Nicolette Bang, MD  [facility] [place]

## 2021-03-06 LAB — URINE CULTURE

## 2021-03-07 ENCOUNTER — Telehealth: Payer: Self-pay

## 2021-03-07 DIAGNOSIS — B353 Tinea pedis: Secondary | ICD-10-CM | POA: Diagnosis not present

## 2021-03-07 DIAGNOSIS — L309 Dermatitis, unspecified: Secondary | ICD-10-CM | POA: Diagnosis not present

## 2021-03-07 DIAGNOSIS — L308 Other specified dermatitis: Secondary | ICD-10-CM | POA: Diagnosis not present

## 2021-03-07 DIAGNOSIS — N3000 Acute cystitis without hematuria: Secondary | ICD-10-CM

## 2021-03-07 MED ORDER — CIPROFLOXACIN HCL 250 MG PO TABS: 250.0000 mg | ORAL_TABLET | Freq: Two times a day (BID) | ORAL | 0 refills | Status: DC

## 2021-03-07 NOTE — Telephone Encounter (Signed)
-----   Message from Cleon Gustin, MD sent at 03/07/2021  8:48 AM EST ----- Cipro 250mg  BID for 7 days ----- Message ----- From: Iris Pert, LPN Sent: [id number]   9:37 AM EST To: Cleon Gustin, MD  Patient started on macrobid

## 2021-03-07 NOTE — Telephone Encounter (Signed)
Rx sent.  Patient called with no answer, no way to leave  message.

## 2021-03-07 NOTE — Telephone Encounter (Signed)
-----   Message from Cleon Gustin, MD sent at 03/07/2021  8:48 AM EST ----- Cipro 250mg  BID for 7 days ----- Message ----- From: Iris Pert, LPN Sent: 01/06/130   9:37 AM EST To: Cleon Gustin, MD  Patient started on macrobid

## 2021-03-07 NOTE — Telephone Encounter (Signed)
Patient's wife called and made aware. Voiced understanding.

## 2021-03-12 ENCOUNTER — Ambulatory Visit (INDEPENDENT_AMBULATORY_CARE_PROVIDER_SITE_OTHER): Payer: Medicare Other | Admitting: *Deleted

## 2021-03-12 DIAGNOSIS — I4821 Permanent atrial fibrillation: Secondary | ICD-10-CM | POA: Diagnosis not present

## 2021-03-12 DIAGNOSIS — Z5181 Encounter for therapeutic drug level monitoring: Secondary | ICD-10-CM

## 2021-03-12 LAB — POCT INR: INR: 2.9 (ref 2.0–3.0)

## 2021-03-12 NOTE — Patient Instructions (Signed)
Continue warfarin 1 1/2 daily except 1 tablet on Mondays and Thursdays Recheck in 6 wks

## 2021-03-21 DIAGNOSIS — L309 Dermatitis, unspecified: Secondary | ICD-10-CM | POA: Diagnosis not present

## 2021-04-15 DIAGNOSIS — B351 Tinea unguium: Secondary | ICD-10-CM | POA: Diagnosis not present

## 2021-04-15 DIAGNOSIS — L851 Acquired keratosis [keratoderma] palmaris et plantaris: Secondary | ICD-10-CM | POA: Diagnosis not present

## 2021-04-15 DIAGNOSIS — L97512 Non-pressure chronic ulcer of other part of right foot with fat layer exposed: Secondary | ICD-10-CM | POA: Diagnosis not present

## 2021-04-15 DIAGNOSIS — E1142 Type 2 diabetes mellitus with diabetic polyneuropathy: Secondary | ICD-10-CM | POA: Diagnosis not present

## 2021-04-16 ENCOUNTER — Encounter: Payer: Self-pay | Admitting: Urology

## 2021-04-16 ENCOUNTER — Other Ambulatory Visit: Payer: Self-pay

## 2021-04-16 ENCOUNTER — Ambulatory Visit (INDEPENDENT_AMBULATORY_CARE_PROVIDER_SITE_OTHER): Payer: Medicare Other | Admitting: Urology

## 2021-04-16 VITALS — BP 128/64 | HR 69

## 2021-04-16 DIAGNOSIS — R3915 Urgency of urination: Secondary | ICD-10-CM | POA: Diagnosis not present

## 2021-04-16 DIAGNOSIS — N400 Enlarged prostate without lower urinary tract symptoms: Secondary | ICD-10-CM

## 2021-04-16 DIAGNOSIS — N3000 Acute cystitis without hematuria: Secondary | ICD-10-CM | POA: Diagnosis not present

## 2021-04-16 LAB — BLADDER SCAN AMB NON-IMAGING: Scan Result: 3

## 2021-04-16 NOTE — Progress Notes (Signed)
04/16/2021 2:17 PM   [name] [name] [birth year] [id number]  Referring provider: [name], MD [place],   [id number]  Followup BPH   HPI: Mr Proch is a 87yo here for followup for BPH with urinary urgency. IPSS 8 QOl 2 on flomax 0.4mg  daily. Urine stream strong. No strainign to urinate. No dysuria. No hematuria. The urinary urgency resolved after macrobid and since starting flomax 0.4mg  daily.   PMH: Past Medical History:  Diagnosis Date   Atrial fibrillation ([place])    Chronic lymphocytic leukemia ([place])    Coronary atherosclerosis of native coronary artery    Nonobstructive   Essential hypertension    Hyperlipidemia    IgA nephropathy    Type 2 diabetes mellitus ([place])    UTI (urinary tract infection)     Surgical History: Past Surgical History:  Procedure Laterality Date   AMPUTATION Left 09/15/2012   Procedure: PARTIAL AMPUTATION 3rd TOE LEFT FOOT;  Surgeon: Marcheta Grammes, DPM;  [facility];  Service: Orthopedics;  Laterality: Left;   AMPUTATION Left 04/20/2013   Procedure: PARTIAL AMPUTATION 2ND TOE LEFT FOOT;  Surgeon: Marcheta Grammes, DPM;  [facility];  Service: Orthopedics;  Laterality: Left;   ANAL FISSURE REPAIR     APPLICATION OF WOUND VAC Right 09/09/2016   Procedure: APPLICATION OF WOUND VAC;  Surgeon: Caprice Beaver, DPM;  [facility]: [facility];  Service: Podiatry;  Laterality: Right;   CATARACT EXTRACTION W/PHACO Right 09/30/2015   Procedure: CATARACT EXTRACTION PHACO AND INTRAOCULAR LENS PLACEMENT (IOC);  Surgeon: Tonny Branch, MD;  [facility]: [facility];  Service: Ophthalmology;  Laterality: Right;  CDE: 11.78   CATARACT EXTRACTION W/PHACO Left 10/31/2015   Procedure: CATARACT EXTRACTION PHACO AND INTRAOCULAR LENS PLACEMENT LEFT EYE; CDE:  9.10;  Surgeon: Tonny Branch, MD;  [facility];  Service: Ophthalmology;  Laterality: Left;   COLONOSCOPY  08/19/2011   Procedure: COLONOSCOPY;  Surgeon: Rogene Houston, MD;  [facility];  Service: Endoscopy;  Laterality: N/A;  930   COLONOSCOPY N/A 09/19/2014   Procedure: COLONOSCOPY;  Surgeon: Rogene Houston, MD;  [facility];  Service: Endoscopy;  Laterality: N/A;  1055   FEMORAL HERNIA REPAIR     INCISION AND DRAINAGE Right 09/09/2016   Procedure: DEBRIDEMENT WOUND RT heel with wound vac attachment;  Surgeon: Caprice Beaver, DPM;  [facility];  Service: Podiatry;  Laterality: Right;   INCISION AND DRAINAGE ABSCESS Right 02/16/2018   Procedure: INCISION AND DRAINAGE ABSCESS;  Surgeon: Leta Baptist, MD;  Location: [place];  Service: ENT;  Laterality: Right;   INCISION AND DRAINAGE OF WOUND Right 08/12/2016   Procedure: DEBRIDEMENT WOUND RIGHT HEEL;  Surgeon: Caprice Beaver, DPM;  [facility];  Service: Podiatry;  Laterality: Right;  right heel   MASS EXCISION Right 09/28/2017   Procedure: EXCISION OF EXTERNAL AUDITORY CANAL MASS;  Surgeon: Leta Baptist, MD;  Location: [place];  Service: ENT;  Laterality: Right;   MASTOIDECTOMY Right 02/16/2018   Procedure: REVISION MASTOIDECTOMY;  Surgeon: Leta Baptist, MD;  Location: [place];  Service: ENT;  Laterality: Right;   Open repair left quadriceps tendon.  08/22/07   Dr. Aline Brochure   Right total knee replacement     TRANSURETHRAL RESECTION OF PROSTATE     TYMPANOMASTOIDECTOMY Right 11/10/2017   Procedure: RIGHT TYMPANOMASTOIDECTOMY;  Surgeon: Leta Baptist, MD;  Location: [place];  Service: ENT;  Laterality: Right;   WOUND EXPLORATION Right 08/12/2016  Procedure: EXPLORATION OF WOUND FOR FOREIGN BODY RIGHT HEEL;  Surgeon: Caprice Beaver, DPM;  [facility];  Service: Podiatry;  Laterality: Right;  right heel    Home Medications:  Allergies as of 04/16/2021       Reactions   Erythromycin Other (See Comments)   Penicillins Hives, Other (See Comments)   Has patient had a PCN reaction causing immediate rash, facial/tongue/throat swelling, SOB or lightheadedness with hypotension:  No Has patient had a PCN reaction causing severe rash involving mucus membranes or skin necrosis: No Has patient had a PCN reaction that required hospitalization No Has patient had a PCN reaction occurring within the last 10 years: No If all of the above answers are "NO", then may proceed with Cephalosporin use. Has tolerated Rocephin        Medication List        Accurate as of April 16, 2021  2:17 PM. If you have any questions, ask your nurse or doctor.          Accu-Chek SmartView test strip Generic drug: glucose blood USE 1 STRIP TO CHECK GLUCOSE THREE TIMES DAILY FOR FLUCTUATING BLOOD SUGAR   amLODipine 10 MG tablet Commonly known as: NORVASC Take 10 mg by mouth daily.   atorvastatin 10 MG tablet Commonly known as: LIPITOR Take 10 mg by mouth every evening.   Calcium Carb-Cholecalciferol 600-800 MG-UNIT Tabs Take 2 tablets by mouth daily with breakfast.   cholecalciferol 1000 units tablet Commonly known as: VITAMIN D Take 1,000 Units by mouth every morning.   ciclopirox 0.77 % cream Commonly known as: LOPROX Apply 1 application topically every morning. APPLY TO THE FEET   ciprofloxacin 250 MG tablet Commonly known as: Cipro Take 1 tablet (250 mg total) by mouth 2 (two) times daily.   Co Q-10 100 MG Caps Take 100 mg by mouth daily with breakfast.   digoxin 0.125 MG tablet Commonly known as: LANOXIN Take 0.125 mg by mouth daily with breakfast.   finasteride 5 MG tablet Commonly known as: PROSCAR Take 1 tablet (5 mg total) by mouth every morning.   Fish Oil 1200 MG Caps Take 2 capsules by mouth 2 (two) times daily.   furosemide 20 MG tablet Commonly known as: LASIX Take 40 mg by mouth daily.   furosemide 40 MG tablet Commonly known as: LASIX Take 40 mg by mouth daily.   HumaLOG KwikPen 100 UNIT/ML KwikPen Generic drug: insulin lispro Inject 1-9 Units into the skin 3 (three) times daily. CBG 121 - 150: 1 unit,  CBG 151 - 200: 2 units,  CBG 201  - 250: 3 units,  CBG 251 - 300: 5 units,  CBG 301 - 350: 7 units,  CBG 351 - 400: 9 units   insulin detemir 100 UNIT/ML FlexPen Commonly known as: Levemir FlexTouch Inject 20 Units into the skin daily. What changed: how much to take   lisinopril 20 MG tablet Commonly known as: ZESTRIL Take 40 mg by mouth daily with supper.   LORazepam 1 MG tablet Commonly known as: ATIVAN Take 1 mg by mouth 2 (two) times daily as needed.   multivitamin with minerals Tabs tablet Take 1 tablet by mouth daily with breakfast.   pioglitazone 15 MG tablet Commonly known as: ACTOS Take 7.5 mg by mouth every evening.   polyethylene glycol 17 g packet Commonly known as: MIRALAX / GLYCOLAX Take 17 g by mouth daily.   potassium chloride SA 20 MEQ tablet Commonly known as: KLOR-CON M Take 40  mEq by mouth daily. Take starting 01/07/2018   tamsulosin 0.4 MG Caps capsule Commonly known as: FLOMAX Take 1 capsule (0.4 mg total) by mouth daily after supper.   Victoza 18 MG/3ML Sopn Generic drug: liraglutide Inject 1.8 mg into the skin daily with breakfast.   vitamin C 1000 MG tablet Take 1,000 mg by mouth daily with breakfast.   vitamin E 180 MG (400 UNITS) capsule Take 400 Units by mouth every morning.   warfarin 5 MG tablet Commonly known as: COUMADIN Take as directed by the anticoagulation clinic. If you are unsure how to take this medication, talk to your nurse or doctor. Original instructions: TAKE 1 & 1/2 (ONE & ONE-HALF) TABLETS BY MOUTH ONCE DAILY EXCEPT 1 TABLET ON WEDNESDAYS AND SATURDAYS OR  AS  DIRECTED  BY  ANTICOAGULATION  CLINIC        Allergies:  Allergies  Allergen Reactions   Erythromycin Other (See Comments)   Penicillins Hives and Other (See Comments)    Has patient had a PCN reaction causing immediate rash, facial/tongue/throat swelling, SOB or lightheadedness with hypotension: No Has patient had a PCN reaction causing severe rash involving mucus membranes or skin  necrosis: No Has patient had a PCN reaction that required hospitalization No Has patient had a PCN reaction occurring within the last 10 years: No If all of the above answers are "NO", then may proceed with Cephalosporin use.  Has tolerated Rocephin    Family History: Family History  Problem Relation Age of Onset   Atrial fibrillation Mother     Social History:  reports that he quit smoking about 58 years ago. His smoking use included cigarettes. He has never used smokeless tobacco. He reports that he does not drink alcohol and does not use drugs.  ROS: All other review of systems were reviewed and are negative except what is noted above in HPI  Physical Exam: BP 128/64    Pulse 69   Constitutional:  Alert and oriented, No acute distress. HEENT: [place] AT, moist mucus membranes.  Trachea midline, no masses. Cardiovascular: No clubbing, cyanosis, or edema. Respiratory: Normal respiratory effort, no increased work of breathing. GI: Abdomen is soft, nontender, nondistended, no abdominal masses GU: No CVA tenderness.  Lymph: No cervical or inguinal lymphadenopathy. Skin: No rashes, bruises or suspicious lesions. Neurologic: Grossly intact, no focal deficits, moving all 4 extremities. Psychiatric: Normal mood and affect.  Laboratory Data: Lab Results  Component Value Date   WBC 11.1 (H) 03/26/2018   HGB 10.7 (L) 03/26/2018   HCT 34.1 (L) 03/26/2018   MCV 90.2 03/26/2018   PLT 153 03/26/2018    Lab Results  Component Value Date   CREATININE 0.76 03/31/2018    No results found for: PSA  No results found for: TESTOSTERONE  Lab Results  Component Value Date   HGBA1C 6.0 (H) 03/31/2018    Urinalysis    Component Value Date/Time   COLORURINE YELLOW 12/14/2017 0050   APPEARANCEUR Clear 03/03/2021 1341   LABSPEC 1.008 12/14/2017 0050   PHURINE 6.0 12/14/2017 0050   GLUCOSEU Negative 03/03/2021 1341   HGBUR NEGATIVE 12/14/2017 0050   BILIRUBINUR Negative 03/03/2021 1341    KETONESUR NEGATIVE 12/14/2017 0050   PROTEINUR Trace (A) 03/03/2021 1341   PROTEINUR NEGATIVE 12/14/2017 0050   NITRITE Negative 03/03/2021 1341   NITRITE NEGATIVE 12/14/2017 0050   LEUKOCYTESUR 1+ (A) 03/03/2021 1341    Lab Results  Component Value Date   LABMICR See below: 03/03/2021   WBCUA 11-30 (  A) 03/03/2021   LABEPIT None seen 03/03/2021   MUCUS Present 03/03/2021   BACTERIA None seen 03/03/2021    Pertinent Imaging:  No results found for this or any previous visit.  No results found for this or any previous visit.  No results found for this or any previous visit.  No results found for this or any previous visit.  Results for orders placed during the hospital encounter of 06/20/16  US Renal  Narrative CLINICAL DATA:  Gross hematuria and urinary tract infections  EXAM: RENAL / URINARY TRACT ULTRASOUND COMPLETE  COMPARISON:  None.  FINDINGS: Right Kidney:  Length: 13.6 cm. Echogenicity and renal cortical thickness are within normal limits. No perinephric fluid or hydronephrosis visualized. There is a cyst arising from the upper pole the right kidney measuring 5.3 x 3.9 x 4.3 cm. A cyst arising from the lower pole of the right kidney measures 2.3 x 2.6 x 2.7 cm. No sonographically demonstrable calculus or ureterectasis.  Left Kidney:  Length: 13.2 cm. Echogenicity and renal cortical thickness are within normal limits. No perinephric fluid or hydronephrosis visualized. There is a cyst arising from the upper pole of the left kidney measuring 2.6 x 2.4 x 3.7 cm. There is a nearby cyst measuring 1.9 x 1.7 x 1.7 cm. No sonographically demonstrable calculus or ureterectasis.  Bladder:  There is irregular debris noted in the urinary bladder. Urinary bladder wall appears mildly thickened. Flow from the distal ureters is seen in the bladder. The patient was unable to void.  IMPRESSION: Complex debris noted within the urinary bladder with mild  urinary bladder wall thickening. Suspect cystitis. Note that patient was unable to void; a finding concerning for bladder outlet obstruction. Etiology for bladder outlet obstruction uncertain.  Cysts noted in each kidney.  No obstructing focus in either kidney.   Electronically Signed By: Lowella Grip III M.D. On: 06/23/2016 11:18  No results found for this or any previous visit.  No results found for this or any previous visit.  No results found for this or any previous visit.   Assessment & Plan:    1. Benign prostatic hyperplasia, unspecified whether lower urinary tract symptoms present -Continue floma x0.4mg  daily - Urinalysis, Routine w reflex microscopic - BLADDER SCAN AMB NON-IMAGING  2. Acute cystitis without hematuria -resolved  3. Urinary urgency -continue flomax 0.4mg  daily   No follow-ups on file.  Nicolette Bang, MD  [facility] [place]

## 2021-04-16 NOTE — Patient Instructions (Signed)

## 2021-04-16 NOTE — Progress Notes (Signed)
post void residual=3  Urological Symptom Review  Patient is experiencing the following symptoms: Frequent urination Erection problems   Review of Systems  Gastrointestinal (upper)  : Negative for upper GI symptoms  Gastrointestinal (lower) : Negative for lower GI symptoms  Constitutional : Negative for symptoms  Skin: Negative for skin symptoms  Eyes: Negative for eye symptoms  Ear/Nose/Throat : Negative for Ear/Nose/Throat symptoms  Hematologic/Lymphatic: Negative for Hematologic/Lymphatic symptoms  Cardiovascular : Leg/Feet swelling  Respiratory : Negative for respiratory symptoms  Endocrine: Negative for endocrine symptoms  Musculoskeletal: Negative for musculoskeletal symptoms  Neurological: Negative for neurological symptoms  Psychologic: Negative for psychiatric symptoms

## 2021-04-17 LAB — MICROSCOPIC EXAMINATION
Epithelial Cells (non renal): NONE SEEN /hpf (ref 0–10)
RBC, Urine: NONE SEEN /hpf (ref 0–2)
Renal Epithel, UA: NONE SEEN /hpf
WBC, UA: NONE SEEN /hpf (ref 0–5)

## 2021-04-17 LAB — URINALYSIS, ROUTINE W REFLEX MICROSCOPIC
Bilirubin, UA: NEGATIVE
Glucose, UA: NEGATIVE
Ketones, UA: NEGATIVE
Leukocytes,UA: NEGATIVE
Nitrite, UA: NEGATIVE
RBC, UA: NEGATIVE
Specific Gravity, UA: 1.015 (ref 1.005–1.030)
Urobilinogen, Ur: 1 mg/dL (ref 0.2–1.0)
pH, UA: 5.5 (ref 5.0–7.5)

## 2021-04-22 DIAGNOSIS — L97512 Non-pressure chronic ulcer of other part of right foot with fat layer exposed: Secondary | ICD-10-CM | POA: Diagnosis not present

## 2021-04-22 DIAGNOSIS — E1142 Type 2 diabetes mellitus with diabetic polyneuropathy: Secondary | ICD-10-CM | POA: Diagnosis not present

## 2021-04-23 ENCOUNTER — Ambulatory Visit (INDEPENDENT_AMBULATORY_CARE_PROVIDER_SITE_OTHER): Payer: Medicare Other | Admitting: *Deleted

## 2021-04-23 DIAGNOSIS — Z5181 Encounter for therapeutic drug level monitoring: Secondary | ICD-10-CM

## 2021-04-23 DIAGNOSIS — I4821 Permanent atrial fibrillation: Secondary | ICD-10-CM

## 2021-04-23 LAB — POCT INR: INR: 2.3 (ref 2.0–3.0)

## 2021-04-23 NOTE — Patient Instructions (Signed)
Continue warfarin 1 1/2 daily except 1 tablet on Mondays and Thursdays Recheck in 6 wks

## 2021-04-29 DIAGNOSIS — Z20822 Contact with and (suspected) exposure to covid-19: Secondary | ICD-10-CM | POA: Diagnosis not present

## 2021-04-29 DIAGNOSIS — Z20828 Contact with and (suspected) exposure to other viral communicable diseases: Secondary | ICD-10-CM | POA: Diagnosis not present

## 2021-05-06 DIAGNOSIS — I10 Essential (primary) hypertension: Secondary | ICD-10-CM | POA: Diagnosis not present

## 2021-05-06 DIAGNOSIS — L97512 Non-pressure chronic ulcer of other part of right foot with fat layer exposed: Secondary | ICD-10-CM | POA: Diagnosis not present

## 2021-05-06 DIAGNOSIS — E782 Mixed hyperlipidemia: Secondary | ICD-10-CM | POA: Diagnosis not present

## 2021-05-06 DIAGNOSIS — E1142 Type 2 diabetes mellitus with diabetic polyneuropathy: Secondary | ICD-10-CM | POA: Diagnosis not present

## 2021-05-07 ENCOUNTER — Other Ambulatory Visit: Payer: Self-pay

## 2021-05-07 DIAGNOSIS — R3915 Urgency of urination: Secondary | ICD-10-CM

## 2021-05-07 DIAGNOSIS — E1165 Type 2 diabetes mellitus with hyperglycemia: Secondary | ICD-10-CM | POA: Diagnosis not present

## 2021-05-07 DIAGNOSIS — Z6841 Body Mass Index (BMI) 40.0 and over, adult: Secondary | ICD-10-CM | POA: Diagnosis not present

## 2021-05-07 DIAGNOSIS — C9111 Chronic lymphocytic leukemia of B-cell type in remission: Secondary | ICD-10-CM | POA: Diagnosis not present

## 2021-05-07 DIAGNOSIS — I10 Essential (primary) hypertension: Secondary | ICD-10-CM | POA: Diagnosis not present

## 2021-05-07 DIAGNOSIS — Z789 Other specified health status: Secondary | ICD-10-CM | POA: Diagnosis not present

## 2021-05-07 DIAGNOSIS — Z299 Encounter for prophylactic measures, unspecified: Secondary | ICD-10-CM | POA: Diagnosis not present

## 2021-05-07 MED ORDER — TAMSULOSIN HCL 0.4 MG PO CAPS: 0.4000 mg | ORAL_CAPSULE | Freq: Every day | ORAL | 3 refills | Status: DC

## 2021-05-20 DIAGNOSIS — L97512 Non-pressure chronic ulcer of other part of right foot with fat layer exposed: Secondary | ICD-10-CM | POA: Diagnosis not present

## 2021-05-20 DIAGNOSIS — E1142 Type 2 diabetes mellitus with diabetic polyneuropathy: Secondary | ICD-10-CM | POA: Diagnosis not present

## 2021-06-03 DIAGNOSIS — L97512 Non-pressure chronic ulcer of other part of right foot with fat layer exposed: Secondary | ICD-10-CM | POA: Diagnosis not present

## 2021-06-03 DIAGNOSIS — E782 Mixed hyperlipidemia: Secondary | ICD-10-CM | POA: Diagnosis not present

## 2021-06-03 DIAGNOSIS — E1142 Type 2 diabetes mellitus with diabetic polyneuropathy: Secondary | ICD-10-CM | POA: Diagnosis not present

## 2021-06-03 DIAGNOSIS — I10 Essential (primary) hypertension: Secondary | ICD-10-CM | POA: Diagnosis not present

## 2021-06-04 ENCOUNTER — Ambulatory Visit (INDEPENDENT_AMBULATORY_CARE_PROVIDER_SITE_OTHER): Payer: Medicare Other | Admitting: *Deleted

## 2021-06-04 DIAGNOSIS — Z5181 Encounter for therapeutic drug level monitoring: Secondary | ICD-10-CM

## 2021-06-04 DIAGNOSIS — I4821 Permanent atrial fibrillation: Secondary | ICD-10-CM | POA: Diagnosis not present

## 2021-06-04 LAB — POCT INR: INR: 2.6 (ref 2.0–3.0)

## 2021-06-04 MED ORDER — WARFARIN SODIUM 5 MG PO TABS: ORAL_TABLET | ORAL | 3 refills | Status: DC

## 2021-06-04 NOTE — Patient Instructions (Signed)
Continue warfarin 1 1/2 daily except 1 tablet on Mondays and Thursdays ?Recheck in 6 wks ?

## 2021-06-13 DIAGNOSIS — Z20822 Contact with and (suspected) exposure to covid-19: Secondary | ICD-10-CM | POA: Diagnosis not present

## 2021-06-17 DIAGNOSIS — B351 Tinea unguium: Secondary | ICD-10-CM | POA: Diagnosis not present

## 2021-06-17 DIAGNOSIS — E1142 Type 2 diabetes mellitus with diabetic polyneuropathy: Secondary | ICD-10-CM | POA: Diagnosis not present

## 2021-06-17 DIAGNOSIS — L851 Acquired keratosis [keratoderma] palmaris et plantaris: Secondary | ICD-10-CM | POA: Diagnosis not present

## 2021-06-17 DIAGNOSIS — L97512 Non-pressure chronic ulcer of other part of right foot with fat layer exposed: Secondary | ICD-10-CM | POA: Diagnosis not present

## 2021-06-21 DIAGNOSIS — Z20822 Contact with and (suspected) exposure to covid-19: Secondary | ICD-10-CM | POA: Diagnosis not present

## 2021-07-04 ENCOUNTER — Encounter: Payer: Self-pay | Admitting: *Deleted

## 2021-07-08 DIAGNOSIS — L97511 Non-pressure chronic ulcer of other part of right foot limited to breakdown of skin: Secondary | ICD-10-CM | POA: Diagnosis not present

## 2021-07-08 DIAGNOSIS — E1142 Type 2 diabetes mellitus with diabetic polyneuropathy: Secondary | ICD-10-CM | POA: Diagnosis not present

## 2021-07-09 DIAGNOSIS — Z20822 Contact with and (suspected) exposure to covid-19: Secondary | ICD-10-CM | POA: Diagnosis not present

## 2021-07-12 DIAGNOSIS — Z20822 Contact with and (suspected) exposure to covid-19: Secondary | ICD-10-CM | POA: Diagnosis not present

## 2021-07-16 ENCOUNTER — Ambulatory Visit (INDEPENDENT_AMBULATORY_CARE_PROVIDER_SITE_OTHER): Payer: Medicare Other | Admitting: *Deleted

## 2021-07-16 DIAGNOSIS — Z5181 Encounter for therapeutic drug level monitoring: Secondary | ICD-10-CM | POA: Diagnosis not present

## 2021-07-16 DIAGNOSIS — I4821 Permanent atrial fibrillation: Secondary | ICD-10-CM

## 2021-07-16 LAB — POCT INR: INR: 2.5 (ref 2.0–3.0)

## 2021-07-16 NOTE — Patient Instructions (Signed)
Continue warfarin 1 1/2 daily except 1 tablet on Mondays and Thursdays ?Recheck in 6 wks ?

## 2021-07-29 DIAGNOSIS — E1142 Type 2 diabetes mellitus with diabetic polyneuropathy: Secondary | ICD-10-CM | POA: Diagnosis not present

## 2021-07-29 DIAGNOSIS — L97511 Non-pressure chronic ulcer of other part of right foot limited to breakdown of skin: Secondary | ICD-10-CM | POA: Diagnosis not present

## 2021-08-02 DIAGNOSIS — Z20822 Contact with and (suspected) exposure to covid-19: Secondary | ICD-10-CM | POA: Diagnosis not present

## 2021-08-06 DIAGNOSIS — Z20822 Contact with and (suspected) exposure to covid-19: Secondary | ICD-10-CM | POA: Diagnosis not present

## 2021-08-07 DIAGNOSIS — F419 Anxiety disorder, unspecified: Secondary | ICD-10-CM | POA: Diagnosis not present

## 2021-08-07 DIAGNOSIS — E1165 Type 2 diabetes mellitus with hyperglycemia: Secondary | ICD-10-CM | POA: Diagnosis not present

## 2021-08-07 DIAGNOSIS — Z6841 Body Mass Index (BMI) 40.0 and over, adult: Secondary | ICD-10-CM | POA: Diagnosis not present

## 2021-08-07 DIAGNOSIS — Z299 Encounter for prophylactic measures, unspecified: Secondary | ICD-10-CM | POA: Diagnosis not present

## 2021-08-07 DIAGNOSIS — I10 Essential (primary) hypertension: Secondary | ICD-10-CM | POA: Diagnosis not present

## 2021-08-08 DIAGNOSIS — Z1151 Encounter for screening for human papillomavirus (HPV): Secondary | ICD-10-CM | POA: Diagnosis not present

## 2021-08-08 DIAGNOSIS — Z20828 Contact with and (suspected) exposure to other viral communicable diseases: Secondary | ICD-10-CM | POA: Diagnosis not present

## 2021-08-08 DIAGNOSIS — Z20822 Contact with and (suspected) exposure to covid-19: Secondary | ICD-10-CM | POA: Diagnosis not present

## 2021-08-10 DIAGNOSIS — Z20822 Contact with and (suspected) exposure to covid-19: Secondary | ICD-10-CM | POA: Diagnosis not present

## 2021-08-12 DIAGNOSIS — Z20828 Contact with and (suspected) exposure to other viral communicable diseases: Secondary | ICD-10-CM | POA: Diagnosis not present

## 2021-08-19 ENCOUNTER — Encounter: Payer: Self-pay | Admitting: Cardiology

## 2021-08-19 ENCOUNTER — Ambulatory Visit (INDEPENDENT_AMBULATORY_CARE_PROVIDER_SITE_OTHER): Payer: Medicare Other | Admitting: Cardiology

## 2021-08-19 VITALS — BP 124/62 | HR 55 | Ht 72.0 in | Wt 270.4 lb

## 2021-08-19 DIAGNOSIS — I350 Nonrheumatic aortic (valve) stenosis: Secondary | ICD-10-CM | POA: Diagnosis not present

## 2021-08-19 DIAGNOSIS — I4821 Permanent atrial fibrillation: Secondary | ICD-10-CM

## 2021-08-19 NOTE — Progress Notes (Signed)
? ? ?Cardiology Office Note ? ?Date: 08/19/2021  ? ?ID: [name] [name], DOB March 31, 1935, MRN [id number] ? ?PCP:  [name], MD  ?Cardiologist:  [name], MD ?Electrophysiologist:  None  ? ?Chief Complaint  ?Patient presents with  ? Cardiac follow-up  ? ? ?History of Present Illness: ?[name] [name] is an 87 y.o. male last seen in November 2022.  He is here for a routine visit.  He does not report any sense of palpitations, no angina symptoms with typical ADLs.  Uses a cane, does not report any falls. ? ?He remains on Coumadin with follow-up in the anticoagulation clinic.  INR was 2.5 in April.  He has occasional nosebleeds, otherwise no major bleeding issues. ? ?I reviewed his medications which are noted below. ? ?Last echocardiogram was in timbre 2022.  At that time LVEF was 55 to 60% and he had mild to moderate aortic stenosis and regurgitation, mean gradient 12 mmHg.  We discussed getting a follow-up study around the time of his next visit. ? ?Past Medical History:  ?Diagnosis Date  ? Atrial fibrillation ([place])   ? Chronic lymphocytic leukemia ([place])   ? Coronary atherosclerosis of native coronary artery   ? Nonobstructive  ? Essential hypertension   ? Hyperlipidemia   ? IgA nephropathy   ? Type 2 diabetes mellitus ([place])   ? UTI (urinary tract infection)   ? ? ?Past Surgical History:  ?Procedure Laterality Date  ? AMPUTATION Left 09/15/2012  ? Procedure: PARTIAL AMPUTATION 3rd TOE LEFT FOOT;  Surgeon: Marcheta Grammes, DPM;  [facility];  Service: Orthopedics;  Laterality: Left;  ? AMPUTATION Left 04/20/2013  ? Procedure: PARTIAL AMPUTATION 2ND TOE LEFT FOOT;  Surgeon: Marcheta Grammes, DPM;  [facility];  Service: Orthopedics;  Laterality: Left;  ? ANAL FISSURE REPAIR    ? APPLICATION OF WOUND VAC Right 09/09/2016  ? Procedure: APPLICATION OF WOUND VAC;  Surgeon: Caprice Beaver, DPM;  [facility];  Service: Podiatry;  Laterality: Right;  ? CATARACT EXTRACTION W/PHACO Right 09/30/2015   ? Procedure: CATARACT EXTRACTION PHACO AND INTRAOCULAR LENS PLACEMENT (IOC);  Surgeon: Tonny Branch, MD;  [facility];  Service: Ophthalmology;  Laterality: Right;  CDE: 11.78  ? CATARACT EXTRACTION W/PHACO Left 10/31/2015  ? Procedure: CATARACT EXTRACTION PHACO AND INTRAOCULAR LENS PLACEMENT LEFT EYE; CDE:  9.10;  Surgeon: Tonny Branch, MD;  [facility];  Service: Ophthalmology;  Laterality: Left;  ? COLONOSCOPY  08/19/2011  ? Procedure: COLONOSCOPY;  Surgeon: Rogene Houston, MD;  [facility];  Service: Endoscopy;  Laterality: N/A;  930  ? COLONOSCOPY N/A 09/19/2014  ? Procedure: COLONOSCOPY;  Surgeon: Rogene Houston, MD;  [facility];  Service: Endoscopy;  Laterality: N/A;  1055  ? FEMORAL HERNIA REPAIR    ? INCISION AND DRAINAGE Right 09/09/2016  ? Procedure: DEBRIDEMENT WOUND RT heel with wound vac attachment;  Surgeon: Caprice Beaver, DPM;  [facility];  Service: Podiatry;  Laterality: Right;  ? INCISION AND DRAINAGE ABSCESS Right 02/16/2018  ? Procedure: INCISION AND DRAINAGE ABSCESS;  Surgeon: Leta Baptist, MD;  Location: [place];  Service: ENT;  Laterality: Right;  ? INCISION AND DRAINAGE OF WOUND Right 08/12/2016  ? Procedure: DEBRIDEMENT WOUND RIGHT HEEL;  Surgeon: Caprice Beaver, DPM;  [facility];  Service: Podiatry;  Laterality: Right;  right heel  ? MASS EXCISION Right 09/28/2017  ? Procedure: EXCISION OF EXTERNAL AUDITORY CANAL MASS;  Surgeon: Leta Baptist, MD;  Location: [place];  Service: ENT;  Laterality: Right;  ? MASTOIDECTOMY Right 02/16/2018  ? Procedure: REVISION MASTOIDECTOMY;  Surgeon: Leta Baptist, MD;  [facility]: [facility];  Service: ENT;  Laterality: Right;  ? Open repair left quadriceps tendon.  08/22/07  ? Dr. Aline Brochure  ? Right total knee replacement    ? TRANSURETHRAL RESECTION OF PROSTATE    ? TYMPANOMASTOIDECTOMY Right 11/10/2017  ? Procedure: RIGHT TYMPANOMASTOIDECTOMY;  Surgeon: Leta Baptist, MD;  Location: [place];  Service:  ENT;  Laterality: Right;  ? WOUND EXPLORATION Right 08/12/2016  ? Procedure: EXPLORATION OF WOUND FOR FOREIGN BODY RIGHT HEEL;  Surgeon: Caprice Beaver, DPM;  [facility];  Service: Podiatry;  Laterality: Right;  right heel  ? ? ?Current Outpatient Medications  ?Medication Sig Dispense Refill  ? ACCU-CHEK SMARTVIEW test strip USE 1 STRIP TO CHECK GLUCOSE THREE TIMES DAILY FOR FLUCTUATING BLOOD SUGAR    ? amLODipine (NORVASC) 10 MG tablet Take 10 mg by mouth daily.    ? Ascorbic Acid (VITAMIN C) 1000 MG tablet Take 1,000 mg by mouth daily with breakfast.    ? atorvastatin (LIPITOR) 10 MG tablet Take 10 mg by mouth every evening.    ? Calcium Carb-Cholecalciferol 600-800 MG-UNIT TABS Take 2 tablets by mouth daily with breakfast.    ? cholecalciferol (VITAMIN D) 1000 units tablet Take 1,000 Units by mouth every morning.    ? ciclopirox (LOPROX) 0.77 % cream Apply 1 application topically every morning. APPLY TO THE FEET    ? Coenzyme Q10 (CO Q-10) 100 MG CAPS Take 100 mg by mouth daily with breakfast.     ? digoxin (LANOXIN) 0.125 MG tablet Take 0.125 mg by mouth daily with breakfast.     ? finasteride (PROSCAR) 5 MG tablet Take 1 tablet (5 mg total) by mouth every morning. 90 tablet 3  ? furosemide (LASIX) 40 MG tablet Take 40 mg by mouth daily.    ? HUMALOG KWIKPEN 100 UNIT/ML KwikPen Inject 1-9 Units into the skin 3 (three) times daily. CBG 121 - 150: 1 unit,  CBG 151 - 200: 2 units,  CBG 201 - 250: 3 units,  CBG 251 - 300: 5 units,  CBG 301 - 350: 7 units,  CBG 351 - 400: 9 units    ? Insulin Detemir (LEVEMIR FLEXTOUCH) 100 UNIT/ML Pen Inject 20 Units into the skin daily. (Patient taking differently: Inject 60 Units into the skin daily.)    ? lisinopril (PRINIVIL,ZESTRIL) 20 MG tablet Take 40 mg by mouth daily with supper.    ? LORazepam (ATIVAN) 1 MG tablet Take 1 mg by mouth 2 (two) times daily as needed.    ? Multiple Vitamin (MULTIVITAMIN WITH MINERALS) TABS tablet Take 1 tablet by mouth daily with  breakfast.    ? Omega-3 Fatty Acids (FISH OIL) 1200 MG CAPS Take 2 capsules by mouth 2 (two) times daily.    ? pioglitazone (ACTOS) 15 MG tablet Take 7.5 mg by mouth every evening.     ? polyethylene glycol (MIRALAX / GLYCOLAX) packet Take 17 g by mouth daily.    ? potassium chloride SA (K-DUR,KLOR-CON) 20 MEQ tablet Take 40 mEq by mouth daily. Take starting 01/07/2018    ? tamsulosin (FLOMAX) 0.4 MG CAPS capsule Take 1 capsule (0.4 mg total) by mouth daily after supper. 90 capsule 3  ? VICTOZA 18 MG/3ML SOPN Inject 1.8 mg into the skin daily with breakfast.    ? vitamin E 400 UNIT  capsule Take 400 Units by mouth every morning.    ? warfarin (COUMADIN) 5 MG tablet TAKE 1 & 1/2 (ONE & ONE-HALF) TABLETS BY MOUTH ONCE DAILY EXCEPT 1 TABLET ON MONDAYS AND THURSDAYS OR  AS  DIRECTED  BY  ANTICOAGULATION  CLINIC 135 tablet 3  ? ?No current facility-administered medications for this visit.  ? ?Allergies:  Erythromycin and Penicillins  ? ?ROS: No palpitations or syncope.  Chronic hearing loss. ? ?Physical Exam: ?VS:  BP 124/62   Pulse (!) 55   Ht 6' (1.829 m)   Wt 270 lb 6.4 oz (122.7 kg)   SpO2 97%   BMI 36.67 kg/m? , BMI Body mass index is 36.67 kg/m?. ? ?Wt Readings from Last 3 Encounters:  ?08/19/21 270 lb 6.4 oz (122.7 kg)  ?02/25/21 269 lb (122 kg)  ?06/27/20 276 lb (125.2 kg)  ?  ?General: Patient appears comfortable at rest. ?HEENT: Conjunctiva and lids normal. ?Neck: Supple, no elevated JVP or carotid bruits, no thyromegaly. ?Lungs: Clear to auscultation, nonlabored breathing at rest. ?Cardiac: Irregularly irregular, no S3, 2/6 systolic murmur. ?Extremities: Stable, chronic appearing leg edema. ? ?ECG:  An ECG dated 02/25/2021 was personally reviewed today and demonstrated:  Atrial fibrillation with right bundle branch block and left anterior fascicular block, increased voltage. ? ?Recent Labwork: ? ?October 2022: Hemoglobin A1c 5.8%, hemoglobin 14.2, platelets 184, TSH 2.88, cholesterol 165, triglycerides 76,  HDL 51, LDL 100, BUN 32, creatinine 1.11, potassium 4.3, AST 23, ALT 14 ? ?Other Studies Reviewed Today: ? ?Echocardiogram 01/02/2021: ? 1. Left ventricular ejection fraction, by estimation, is 55 to 60%. The  ?lef

## 2021-08-19 NOTE — Patient Instructions (Signed)

## 2021-08-22 DIAGNOSIS — H903 Sensorineural hearing loss, bilateral: Secondary | ICD-10-CM | POA: Diagnosis not present

## 2021-08-22 DIAGNOSIS — H838X3 Other specified diseases of inner ear, bilateral: Secondary | ICD-10-CM | POA: Diagnosis not present

## 2021-08-22 DIAGNOSIS — H7011 Chronic mastoiditis, right ear: Secondary | ICD-10-CM | POA: Diagnosis not present

## 2021-08-26 DIAGNOSIS — E1142 Type 2 diabetes mellitus with diabetic polyneuropathy: Secondary | ICD-10-CM | POA: Diagnosis not present

## 2021-08-26 DIAGNOSIS — L851 Acquired keratosis [keratoderma] palmaris et plantaris: Secondary | ICD-10-CM | POA: Diagnosis not present

## 2021-08-26 DIAGNOSIS — B351 Tinea unguium: Secondary | ICD-10-CM | POA: Diagnosis not present

## 2021-09-03 ENCOUNTER — Ambulatory Visit (INDEPENDENT_AMBULATORY_CARE_PROVIDER_SITE_OTHER): Payer: Medicare Other | Admitting: *Deleted

## 2021-09-03 DIAGNOSIS — Z5181 Encounter for therapeutic drug level monitoring: Secondary | ICD-10-CM | POA: Diagnosis not present

## 2021-09-03 DIAGNOSIS — I4821 Permanent atrial fibrillation: Secondary | ICD-10-CM

## 2021-09-03 LAB — POCT INR: INR: 2.7 (ref 2.0–3.0)

## 2021-09-03 NOTE — Patient Instructions (Signed)
Continue warfarin 1 1/2 daily except 1 tablet on Mondays and Thursdays Recheck in 7 wks

## 2021-09-09 DIAGNOSIS — E1142 Type 2 diabetes mellitus with diabetic polyneuropathy: Secondary | ICD-10-CM | POA: Diagnosis not present

## 2021-09-30 DIAGNOSIS — Z794 Long term (current) use of insulin: Secondary | ICD-10-CM | POA: Diagnosis not present

## 2021-09-30 DIAGNOSIS — Z961 Presence of intraocular lens: Secondary | ICD-10-CM | POA: Diagnosis not present

## 2021-09-30 DIAGNOSIS — E109 Type 1 diabetes mellitus without complications: Secondary | ICD-10-CM | POA: Diagnosis not present

## 2021-09-30 DIAGNOSIS — H02231 Paralytic lagophthalmos right upper eyelid: Secondary | ICD-10-CM | POA: Diagnosis not present

## 2021-10-15 ENCOUNTER — Encounter: Payer: Self-pay | Admitting: Urology

## 2021-10-15 ENCOUNTER — Ambulatory Visit (INDEPENDENT_AMBULATORY_CARE_PROVIDER_SITE_OTHER): Payer: Medicare Other | Admitting: Urology

## 2021-10-15 VITALS — BP 128/60 | HR 59

## 2021-10-15 DIAGNOSIS — R3915 Urgency of urination: Secondary | ICD-10-CM | POA: Diagnosis not present

## 2021-10-15 DIAGNOSIS — N400 Enlarged prostate without lower urinary tract symptoms: Secondary | ICD-10-CM

## 2021-10-15 DIAGNOSIS — N401 Enlarged prostate with lower urinary tract symptoms: Secondary | ICD-10-CM

## 2021-10-15 LAB — MICROSCOPIC EXAMINATION
Bacteria, UA: NONE SEEN
Epithelial Cells (non renal): NONE SEEN /hpf (ref 0–10)
RBC, Urine: NONE SEEN /hpf (ref 0–2)
WBC, UA: NONE SEEN /hpf (ref 0–5)

## 2021-10-15 LAB — URINALYSIS, ROUTINE W REFLEX MICROSCOPIC
Bilirubin, UA: NEGATIVE
Glucose, UA: NEGATIVE
Ketones, UA: NEGATIVE
Leukocytes,UA: NEGATIVE
Nitrite, UA: NEGATIVE
RBC, UA: NEGATIVE
Specific Gravity, UA: 1.01 (ref 1.005–1.030)
Urobilinogen, Ur: 0.2 mg/dL (ref 0.2–1.0)
pH, UA: 5.5 (ref 5.0–7.5)

## 2021-10-15 LAB — BLADDER SCAN AMB NON-IMAGING: Scan Result: 6

## 2021-10-15 MED ORDER — TAMSULOSIN HCL 0.4 MG PO CAPS: 0.4000 mg | ORAL_CAPSULE | Freq: Every day | ORAL | 3 refills | Status: DC

## 2021-10-15 MED ORDER — FINASTERIDE 5 MG PO TABS: 5.0000 mg | ORAL_TABLET | Freq: Every morning | ORAL | 3 refills | Status: DC

## 2021-10-15 NOTE — Progress Notes (Signed)
post void residual=6

## 2021-10-15 NOTE — Progress Notes (Signed)
10/15/2021 2:38 PM   [name] 1934/07/22 [id number]  Referring provider: [name], MD [address] [place],  [place] [address]  Followup BPH  HPI: [name] is a 87yo here for followup for BPH and urinary urgency. IPSS 11 QOl 1 on flomax 0.'4mg'$  daily and finasteride '5mg'$  daily. Nocturia 1x. No straining to urinate. Urine stream strong.  He has urinary urgency once a day but no urge incontinence. No other complaints today   PMH: Past Medical History:  Diagnosis Date   Atrial fibrillation ([place])    Chronic lymphocytic leukemia ([place])    Coronary atherosclerosis of native coronary artery    Nonobstructive   Essential hypertension    Hyperlipidemia    IgA nephropathy    Type 2 diabetes mellitus ([place])    UTI (urinary tract infection)     Surgical History: Past Surgical History:  Procedure Laterality Date   AMPUTATION Left 09/15/2012   Procedure: PARTIAL AMPUTATION 3rd TOE LEFT FOOT;  Surgeon: Marcheta Grammes, DPM;  Location: [facility];  Service: Orthopedics;  Laterality: Left;   AMPUTATION Left 04/20/2013   Procedure: PARTIAL AMPUTATION 2ND TOE LEFT FOOT;  Surgeon: Marcheta Grammes, DPM;  Location: [facility];  Service: Orthopedics;  Laterality: Left;   ANAL FISSURE REPAIR     APPLICATION OF WOUND VAC Right 09/09/2016   Procedure: APPLICATION OF WOUND VAC;  Surgeon: Caprice Beaver, DPM;  Location: [facility];  Service: Podiatry;  Laterality: Right;   CATARACT EXTRACTION W/PHACO Right 09/30/2015   Procedure: CATARACT EXTRACTION PHACO AND INTRAOCULAR LENS PLACEMENT (IOC);  Surgeon: Tonny Branch, MD;  Location: [facility];  Service: Ophthalmology;  Laterality: Right;  CDE: 11.78   CATARACT EXTRACTION W/PHACO Left 10/31/2015   Procedure: CATARACT EXTRACTION PHACO AND INTRAOCULAR LENS PLACEMENT LEFT EYE; CDE:  9.10;  Surgeon: Tonny Branch, MD;  Location: [facility];  Service: Ophthalmology;  Laterality: Left;   COLONOSCOPY  08/19/2011   Procedure: COLONOSCOPY;  Surgeon: Rogene Houston, MD;   [facility];  Service: Endoscopy;  Laterality: N/A;  930   COLONOSCOPY N/A 09/19/2014   Procedure: COLONOSCOPY;  Surgeon: Rogene Houston, MD;  [facility];  Service: Endoscopy;  Laterality: N/A;  1055   FEMORAL HERNIA REPAIR     INCISION AND DRAINAGE Right 09/09/2016   Procedure: DEBRIDEMENT WOUND RT heel with wound vac attachment;  Surgeon: Caprice Beaver, DPM;  Location: [facility];  Service: Podiatry;  Laterality: Right;   INCISION AND DRAINAGE ABSCESS Right 02/16/2018   Procedure: INCISION AND DRAINAGE ABSCESS;  Surgeon: Leta Baptist, MD;  Location: [facility];  Service: ENT;  Laterality: Right;   INCISION AND DRAINAGE OF WOUND Right 08/12/2016   Procedure: DEBRIDEMENT WOUND RIGHT HEEL;  Surgeon: Caprice Beaver, DPM;  Location: [facility];  Service: Podiatry;  Laterality: Right;  right heel   MASS EXCISION Right 09/28/2017   Procedure: EXCISION OF EXTERNAL AUDITORY CANAL MASS;  Surgeon: Leta Baptist, MD;  Location: [facility];  Service: ENT;  Laterality: Right;   MASTOIDECTOMY Right 02/16/2018   Procedure: REVISION MASTOIDECTOMY;  Surgeon: Leta Baptist, MD;  Location: [facility];  Service: ENT;  Laterality: Right;   Open repair left quadriceps tendon.  08/22/07   Dr. Aline Brochure   Right total knee replacement     TRANSURETHRAL RESECTION OF PROSTATE     TYMPANOMASTOIDECTOMY Right 11/10/2017   Procedure: RIGHT TYMPANOMASTOIDECTOMY;  Surgeon: Leta Baptist, MD;  Location: [facility];  Service: ENT;  Laterality: Right;  WOUND EXPLORATION Right 08/12/2016   Procedure: EXPLORATION OF WOUND FOR FOREIGN BODY RIGHT HEEL;  Surgeon: Caprice Beaver, DPM;  Location: [facility];  Service: Podiatry;  Laterality: Right;  right heel    Home Medications:  Allergies as of 10/15/2021       Reactions   Erythromycin Other (See Comments)   Penicillins Hives, Other (See Comments)   Has patient had a PCN reaction causing immediate rash, facial/tongue/throat swelling, SOB or lightheadedness  with hypotension: No Has patient had a PCN reaction causing severe rash involving mucus membranes or skin necrosis: No Has patient had a PCN reaction that required hospitalization No Has patient had a PCN reaction occurring within the last 10 years: No If all of the above answers are "NO", then may proceed with Cephalosporin use. Has tolerated Rocephin        Medication List        Accurate as of October 15, 2021  2:38 PM. If you have any questions, ask your nurse or doctor.          Accu-Chek SmartView test strip Generic drug: glucose blood USE 1 STRIP TO CHECK GLUCOSE THREE TIMES DAILY FOR FLUCTUATING BLOOD SUGAR   amLODipine 10 MG tablet Commonly known as: NORVASC Take 10 mg by mouth daily.   atorvastatin 10 MG tablet Commonly known as: LIPITOR Take 10 mg by mouth every evening.   Calcium Carb-Cholecalciferol 600-800 MG-UNIT Tabs Take 2 tablets by mouth daily with breakfast.   cholecalciferol 1000 units tablet Commonly known as: VITAMIN D Take 1,000 Units by mouth every morning.   ciclopirox 0.77 % cream Commonly known as: LOPROX Apply 1 application topically every morning. APPLY TO THE FEET   Co Q-10 100 MG Caps Take 100 mg by mouth daily with breakfast.   digoxin 0.125 MG tablet Commonly known as: LANOXIN Take 0.125 mg by mouth daily with breakfast.   finasteride 5 MG tablet Commonly known as: PROSCAR Take 1 tablet (5 mg total) by mouth every morning.   Fish Oil 1200 MG Caps Take 2 capsules by mouth 2 (two) times daily.   furosemide 40 MG tablet Commonly known as: LASIX Take 40 mg by mouth daily.   HumaLOG KwikPen 100 UNIT/ML KwikPen Generic drug: insulin lispro Inject 1-9 Units into the skin 3 (three) times daily. CBG 121 - 150: 1 unit,  CBG 151 - 200: 2 units,  CBG 201 - 250: 3 units,  CBG 251 - 300: 5 units,  CBG 301 - 350: 7 units,  CBG 351 - 400: 9 units   insulin detemir 100 UNIT/ML FlexPen Commonly known as: Levemir FlexTouch Inject 20 Units  into the skin daily. What changed: how much to take   lisinopril 20 MG tablet Commonly known as: ZESTRIL Take 40 mg by mouth daily with supper.   LORazepam 1 MG tablet Commonly known as: ATIVAN Take 1 mg by mouth 2 (two) times daily as needed.   multivitamin with minerals Tabs tablet Take 1 tablet by mouth daily with breakfast.   pioglitazone 15 MG tablet Commonly known as: ACTOS Take 7.5 mg by mouth every evening.   polyethylene glycol 17 g packet Commonly known as: MIRALAX / GLYCOLAX Take 17 g by mouth daily.   potassium chloride SA 20 MEQ tablet Commonly known as: KLOR-CON M Take 40 mEq by mouth daily. Take starting 01/07/2018   tamsulosin 0.4 MG Caps capsule Commonly known as: FLOMAX Take 1 capsule (0.4 mg total) by mouth daily after supper.   Victoza  18 MG/3ML Sopn Generic drug: liraglutide Inject 1.8 mg into the skin daily with breakfast.   vitamin C 1000 MG tablet Take 1,000 mg by mouth daily with breakfast.   vitamin E 180 MG (400 UNITS) capsule Take 400 Units by mouth every morning.   warfarin 5 MG tablet Commonly known as: COUMADIN Take as directed by the anticoagulation clinic. If you are unsure how to take this medication, talk to your nurse or doctor. Original instructions: TAKE 1 & 1/2 (ONE & ONE-HALF) TABLETS BY MOUTH ONCE DAILY EXCEPT 1 TABLET ON MONDAYS AND THURSDAYS OR  AS  DIRECTED  BY  ANTICOAGULATION  CLINIC        Allergies:  Allergies  Allergen Reactions   Erythromycin Other (See Comments)   Penicillins Hives and Other (See Comments)    Has patient had a PCN reaction causing immediate rash, facial/tongue/throat swelling, SOB or lightheadedness with hypotension: No Has patient had a PCN reaction causing severe rash involving mucus membranes or skin necrosis: No Has patient had a PCN reaction that required hospitalization No Has patient had a PCN reaction occurring within the last 10 years: No If all of the above answers are "NO", then may  proceed with Cephalosporin use.  Has tolerated Rocephin    Family History: Family History  Problem Relation Age of Onset   Atrial fibrillation Mother     Social History:  reports that he quit smoking about 58 years ago. His smoking use included cigarettes. He has never used smokeless tobacco. He reports that he does not drink alcohol and does not use drugs.  ROS: All other review of systems were reviewed and are negative except what is noted above in HPI  Physical Exam: BP 128/60   Pulse (!) 59   Constitutional:  Alert and oriented, No acute distress. HEENT: [place] AT, moist mucus membranes.  Trachea midline, no masses. Cardiovascular: No clubbing, cyanosis, or edema. Respiratory: Normal respiratory effort, no increased work of breathing. GI: Abdomen is soft, nontender, nondistended, no abdominal masses GU: No CVA tenderness.  Lymph: No cervical or inguinal lymphadenopathy. Skin: No rashes, bruises or suspicious lesions. Neurologic: Grossly intact, no focal deficits, moving all 4 extremities. Psychiatric: Normal mood and affect.  Laboratory Data: Lab Results  Component Value Date   WBC 11.1 (H) 03/26/2018   HGB 10.7 (L) 03/26/2018   HCT 34.1 (L) 03/26/2018   MCV 90.2 03/26/2018   PLT 153 03/26/2018    Lab Results  Component Value Date   CREATININE 0.76 03/31/2018    No results found for: "PSA"  No results found for: "TESTOSTERONE"  Lab Results  Component Value Date   HGBA1C 6.0 (H) 03/31/2018    Urinalysis    Component Value Date/Time   COLORURINE YELLOW 12/14/2017 0050   APPEARANCEUR Clear 04/16/2021 1610   LABSPEC 1.008 12/14/2017 0050   PHURINE 6.0 12/14/2017 0050   GLUCOSEU Negative 04/16/2021 1610   HGBUR NEGATIVE 12/14/2017 0050   BILIRUBINUR Negative 04/16/2021 1610   KETONESUR NEGATIVE 12/14/2017 0050   PROTEINUR 2+ (A) 04/16/2021 1610   PROTEINUR NEGATIVE 12/14/2017 0050   NITRITE Negative 04/16/2021 1610   NITRITE NEGATIVE 12/14/2017 0050    LEUKOCYTESUR Negative 04/16/2021 1610    Lab Results  Component Value Date   LABMICR See below: 04/16/2021   WBCUA None seen 04/16/2021   LABEPIT None seen 04/16/2021   MUCUS Present 04/16/2021   BACTERIA Few 04/16/2021    Pertinent Imaging:  No results found for this or any previous visit.  No results found for this or any previous visit.  No results found for this or any previous visit.  No results found for this or any previous visit.  Results for orders placed during the hospital encounter of 06/20/16  US Renal  Narrative CLINICAL DATA:  Gross hematuria and urinary tract infections  EXAM: RENAL / URINARY TRACT ULTRASOUND COMPLETE  COMPARISON:  None.  FINDINGS: Right Kidney:  Length: 13.6 cm. Echogenicity and renal cortical thickness are within normal limits. No perinephric fluid or hydronephrosis visualized. There is a cyst arising from the upper pole the right kidney measuring 5.3 x 3.9 x 4.3 cm. A cyst arising from the lower pole of the right kidney measures 2.3 x 2.6 x 2.7 cm. No sonographically demonstrable calculus or ureterectasis.  Left Kidney:  Length: 13.2 cm. Echogenicity and renal cortical thickness are within normal limits. No perinephric fluid or hydronephrosis visualized. There is a cyst arising from the upper pole of the left kidney measuring 2.6 x 2.4 x 3.7 cm. There is a nearby cyst measuring 1.9 x 1.7 x 1.7 cm. No sonographically demonstrable calculus or ureterectasis.  Bladder:  There is irregular debris noted in the urinary bladder. Urinary bladder wall appears mildly thickened. Flow from the distal ureters is seen in the bladder. The patient was unable to void.  IMPRESSION: Complex debris noted within the urinary bladder with mild urinary bladder wall thickening. Suspect cystitis. Note that patient was unable to void; a finding concerning for bladder outlet obstruction. Etiology for bladder outlet obstruction uncertain.  Cysts  noted in each kidney.  No obstructing focus in either kidney.   Electronically Signed By: Lowella Grip III M.D. On: 06/23/2016 11:18  No results found for this or any previous visit.  No results found for this or any previous visit.  No results found for this or any previous visit.   Assessment & Plan:    1. Urinary urgency -Continue finasteride and flomax  - BLADDER SCAN AMB NON-IMAGING - Urinalysis, Routine w reflex microscopic  2. Benign prostatic hyperplasia, unspecified whether lower urinary tract symptoms present -Continue finasteride and flomax   No follow-ups on file.  Nicolette Bang, MD  [facility] [place]

## 2021-10-15 NOTE — Patient Instructions (Signed)

## 2021-10-22 ENCOUNTER — Ambulatory Visit (INDEPENDENT_AMBULATORY_CARE_PROVIDER_SITE_OTHER): Payer: Medicare Other | Admitting: *Deleted

## 2021-10-22 DIAGNOSIS — I4821 Permanent atrial fibrillation: Secondary | ICD-10-CM | POA: Diagnosis not present

## 2021-10-22 DIAGNOSIS — Z5181 Encounter for therapeutic drug level monitoring: Secondary | ICD-10-CM | POA: Diagnosis not present

## 2021-10-22 LAB — POCT INR: INR: 1.7 — AB (ref 2.0–3.0)

## 2021-10-22 NOTE — Patient Instructions (Signed)
Take warfarin 2 tablets tonight and tomorrow night then resume 1 1/2 tablets daily except 1 tablet on Mondays and Thursdays Recheck in 3 wks

## 2021-11-03 DIAGNOSIS — F419 Anxiety disorder, unspecified: Secondary | ICD-10-CM | POA: Diagnosis not present

## 2021-11-03 DIAGNOSIS — E1142 Type 2 diabetes mellitus with diabetic polyneuropathy: Secondary | ICD-10-CM | POA: Diagnosis not present

## 2021-11-03 DIAGNOSIS — I10 Essential (primary) hypertension: Secondary | ICD-10-CM | POA: Diagnosis not present

## 2021-11-03 DIAGNOSIS — E7800 Pure hypercholesterolemia, unspecified: Secondary | ICD-10-CM | POA: Diagnosis not present

## 2021-11-04 DIAGNOSIS — B351 Tinea unguium: Secondary | ICD-10-CM | POA: Diagnosis not present

## 2021-11-04 DIAGNOSIS — E1142 Type 2 diabetes mellitus with diabetic polyneuropathy: Secondary | ICD-10-CM | POA: Diagnosis not present

## 2021-11-04 DIAGNOSIS — Z794 Long term (current) use of insulin: Secondary | ICD-10-CM | POA: Diagnosis not present

## 2021-11-04 DIAGNOSIS — L97519 Non-pressure chronic ulcer of other part of right foot with unspecified severity: Secondary | ICD-10-CM | POA: Diagnosis not present

## 2021-11-04 DIAGNOSIS — L84 Corns and callosities: Secondary | ICD-10-CM | POA: Diagnosis not present

## 2021-11-04 DIAGNOSIS — E11621 Type 2 diabetes mellitus with foot ulcer: Secondary | ICD-10-CM | POA: Diagnosis not present

## 2021-11-04 DIAGNOSIS — Z7985 Long-term (current) use of injectable non-insulin antidiabetic drugs: Secondary | ICD-10-CM | POA: Diagnosis not present

## 2021-11-05 ENCOUNTER — Telehealth: Payer: Self-pay | Admitting: Orthopedic Surgery

## 2021-11-05 NOTE — Telephone Encounter (Signed)
Called patient and relayed. Patient requests to see Dr Aline Brochure tomorrow, as said he had the surgery by Dr Theda Sers, who is long ago retired.

## 2021-11-05 NOTE — Telephone Encounter (Signed)
See me tomorrow or urgent care at [facility] where surgery was done

## 2021-11-05 NOTE — Telephone Encounter (Signed)
Patient called (on line at this time) relaying that his right knee, since the night before last, is 'locked up'; said 'can hardly bend it because it is swollen'- said now using a walker but can hardly walk. States this is the knee that he's had a total knee replacement many years ago in [place].  Said that Dr Aline Brochure did surgery on his left knee. Based on current scheduling, please advise, if office, or emergency room.

## 2021-11-06 ENCOUNTER — Encounter: Payer: Self-pay | Admitting: Orthopedic Surgery

## 2021-11-06 ENCOUNTER — Ambulatory Visit (INDEPENDENT_AMBULATORY_CARE_PROVIDER_SITE_OTHER): Payer: Medicare Other | Admitting: Orthopedic Surgery

## 2021-11-06 ENCOUNTER — Ambulatory Visit (INDEPENDENT_AMBULATORY_CARE_PROVIDER_SITE_OTHER): Payer: Medicare Other

## 2021-11-06 VITALS — BP 132/52 | HR 67 | Ht 72.0 in | Wt 263.0 lb

## 2021-11-06 DIAGNOSIS — G8929 Other chronic pain: Secondary | ICD-10-CM

## 2021-11-06 DIAGNOSIS — Z96651 Presence of right artificial knee joint: Secondary | ICD-10-CM

## 2021-11-06 DIAGNOSIS — M25561 Pain in right knee: Secondary | ICD-10-CM

## 2021-11-06 NOTE — Patient Instructions (Signed)
Use walker next 2 weeks

## 2021-11-06 NOTE — Progress Notes (Signed)
Chief Complaint  Patient presents with   Knee Pain    R/ hurting for 2 days now. Knee replacement 23 years ago. Just got up  and knee locked up, couldn't put weight on it then, but can now. Knee is swollen.   [name] [name] is 87 years old he had a right total knee done in [facility] by Dr. 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  This seems to be temporally associated with a change in his warfarin therapy  He initially had trouble weightbearing and getting up and down from a seated position.  However now he can use his walker he had been using a cane and he can get up independently.  His examination shows a swollen right knee no erythema well-healed incision distally has significant varicosities and peripheral edema  He has a positive drawer test at 90 degrees of flexion no collateral ligament instability.  No tenderness around the joint  With his permission we attempted an aspiration of the right knee.  After sterile prep and confirmation of site we used an 18-gauge needle we aspirated about 5 cc of clotted blood I tried to put some lidocaine in it to dissolve some of the clot and reaspirate but I could not get any other blood out there was no purulence  We recommended he use his walker for 2 weeks  We gave him the number to [facility] for further follow-up if needed.  He walked out with his walker weightbearing as tolerated

## 2021-11-10 DIAGNOSIS — Z299 Encounter for prophylactic measures, unspecified: Secondary | ICD-10-CM | POA: Diagnosis not present

## 2021-11-10 DIAGNOSIS — Z6838 Body mass index (BMI) 38.0-38.9, adult: Secondary | ICD-10-CM | POA: Diagnosis not present

## 2021-11-10 DIAGNOSIS — Z0000 Encounter for general adult medical examination without abnormal findings: Secondary | ICD-10-CM | POA: Diagnosis not present

## 2021-11-10 DIAGNOSIS — Z713 Dietary counseling and surveillance: Secondary | ICD-10-CM | POA: Diagnosis not present

## 2021-11-10 DIAGNOSIS — I10 Essential (primary) hypertension: Secondary | ICD-10-CM | POA: Diagnosis not present

## 2021-11-10 DIAGNOSIS — E1165 Type 2 diabetes mellitus with hyperglycemia: Secondary | ICD-10-CM | POA: Diagnosis not present

## 2021-11-12 ENCOUNTER — Ambulatory Visit (INDEPENDENT_AMBULATORY_CARE_PROVIDER_SITE_OTHER): Payer: Medicare Other | Admitting: *Deleted

## 2021-11-12 DIAGNOSIS — I4821 Permanent atrial fibrillation: Secondary | ICD-10-CM

## 2021-11-12 DIAGNOSIS — Z5181 Encounter for therapeutic drug level monitoring: Secondary | ICD-10-CM

## 2021-11-12 LAB — POCT INR: INR: 3.8 — AB (ref 2.0–3.0)

## 2021-11-12 NOTE — Patient Instructions (Signed)
Hold warfarin tonight then resume 1 1/2 tablets daily except 1 tablet on Mondays and Thursdays Recheck in 1 wk

## 2021-11-18 DIAGNOSIS — L97519 Non-pressure chronic ulcer of other part of right foot with unspecified severity: Secondary | ICD-10-CM | POA: Diagnosis not present

## 2021-11-18 DIAGNOSIS — E1142 Type 2 diabetes mellitus with diabetic polyneuropathy: Secondary | ICD-10-CM | POA: Diagnosis not present

## 2021-11-19 ENCOUNTER — Ambulatory Visit (INDEPENDENT_AMBULATORY_CARE_PROVIDER_SITE_OTHER): Payer: Medicare Other | Admitting: *Deleted

## 2021-11-19 DIAGNOSIS — I4821 Permanent atrial fibrillation: Secondary | ICD-10-CM | POA: Diagnosis not present

## 2021-11-19 DIAGNOSIS — Z5181 Encounter for therapeutic drug level monitoring: Secondary | ICD-10-CM | POA: Diagnosis not present

## 2021-11-19 LAB — POCT INR: INR: 2.8 (ref 2.0–3.0)

## 2021-11-19 NOTE — Patient Instructions (Signed)
Continue warfarin 1 1/2 tablets daily except 1 tablet on Mondays and Thursdays Recheck in 3 wk

## 2021-12-10 ENCOUNTER — Ambulatory Visit: Payer: Medicare Other | Attending: Cardiology | Admitting: *Deleted

## 2021-12-10 DIAGNOSIS — Z5181 Encounter for therapeutic drug level monitoring: Secondary | ICD-10-CM | POA: Insufficient documentation

## 2021-12-10 DIAGNOSIS — I4821 Permanent atrial fibrillation: Secondary | ICD-10-CM | POA: Insufficient documentation

## 2021-12-10 LAB — POCT INR: INR: 2.7 (ref 2.0–3.0)

## 2021-12-10 NOTE — Patient Instructions (Signed)
Continue warfarin 1 1/2 tablets daily except 1 tablet on Mondays and Thursdays Recheck in 4 wk

## 2022-01-07 ENCOUNTER — Ambulatory Visit: Payer: Medicare Other | Attending: Cardiology | Admitting: *Deleted

## 2022-01-07 DIAGNOSIS — Z5181 Encounter for therapeutic drug level monitoring: Secondary | ICD-10-CM

## 2022-01-07 DIAGNOSIS — I4821 Permanent atrial fibrillation: Secondary | ICD-10-CM

## 2022-01-07 LAB — POCT INR: INR: 3.2 — AB (ref 2.0–3.0)

## 2022-01-07 NOTE — Patient Instructions (Signed)
Take 1/2 tablet tonight then resume 1 1/2 tablets daily except 1 tablet on Mondays and Thursdays Recheck in 4 wk

## 2022-01-13 DIAGNOSIS — L84 Corns and callosities: Secondary | ICD-10-CM | POA: Diagnosis not present

## 2022-01-13 DIAGNOSIS — B351 Tinea unguium: Secondary | ICD-10-CM | POA: Diagnosis not present

## 2022-01-13 DIAGNOSIS — E1142 Type 2 diabetes mellitus with diabetic polyneuropathy: Secondary | ICD-10-CM | POA: Diagnosis not present

## 2022-01-15 DIAGNOSIS — Z299 Encounter for prophylactic measures, unspecified: Secondary | ICD-10-CM | POA: Diagnosis not present

## 2022-01-15 DIAGNOSIS — Z0000 Encounter for general adult medical examination without abnormal findings: Secondary | ICD-10-CM | POA: Diagnosis not present

## 2022-01-15 DIAGNOSIS — Z1339 Encounter for screening examination for other mental health and behavioral disorders: Secondary | ICD-10-CM | POA: Diagnosis not present

## 2022-01-15 DIAGNOSIS — Z7189 Other specified counseling: Secondary | ICD-10-CM | POA: Diagnosis not present

## 2022-01-15 DIAGNOSIS — I10 Essential (primary) hypertension: Secondary | ICD-10-CM | POA: Diagnosis not present

## 2022-01-15 DIAGNOSIS — Z23 Encounter for immunization: Secondary | ICD-10-CM | POA: Diagnosis not present

## 2022-01-15 DIAGNOSIS — E7800 Pure hypercholesterolemia, unspecified: Secondary | ICD-10-CM | POA: Diagnosis not present

## 2022-01-15 DIAGNOSIS — Z1331 Encounter for screening for depression: Secondary | ICD-10-CM | POA: Diagnosis not present

## 2022-01-15 DIAGNOSIS — Z79899 Other long term (current) drug therapy: Secondary | ICD-10-CM | POA: Diagnosis not present

## 2022-01-15 DIAGNOSIS — R5383 Other fatigue: Secondary | ICD-10-CM | POA: Diagnosis not present

## 2022-01-15 DIAGNOSIS — Z6838 Body mass index (BMI) 38.0-38.9, adult: Secondary | ICD-10-CM | POA: Diagnosis not present

## 2022-01-16 DIAGNOSIS — E7800 Pure hypercholesterolemia, unspecified: Secondary | ICD-10-CM | POA: Diagnosis not present

## 2022-01-16 DIAGNOSIS — Z79899 Other long term (current) drug therapy: Secondary | ICD-10-CM | POA: Diagnosis not present

## 2022-01-16 DIAGNOSIS — R5383 Other fatigue: Secondary | ICD-10-CM | POA: Diagnosis not present

## 2022-01-16 DIAGNOSIS — Z125 Encounter for screening for malignant neoplasm of prostate: Secondary | ICD-10-CM | POA: Diagnosis not present

## 2022-01-27 DIAGNOSIS — Z299 Encounter for prophylactic measures, unspecified: Secondary | ICD-10-CM | POA: Diagnosis not present

## 2022-01-27 DIAGNOSIS — Z713 Dietary counseling and surveillance: Secondary | ICD-10-CM | POA: Diagnosis not present

## 2022-01-27 DIAGNOSIS — J019 Acute sinusitis, unspecified: Secondary | ICD-10-CM | POA: Diagnosis not present

## 2022-01-27 DIAGNOSIS — Z6838 Body mass index (BMI) 38.0-38.9, adult: Secondary | ICD-10-CM | POA: Diagnosis not present

## 2022-01-27 DIAGNOSIS — R5383 Other fatigue: Secondary | ICD-10-CM | POA: Diagnosis not present

## 2022-01-28 ENCOUNTER — Ambulatory Visit: Payer: Medicare Other | Admitting: Urology

## 2022-02-04 ENCOUNTER — Ambulatory Visit: Payer: Medicare Other | Attending: Cardiology | Admitting: *Deleted

## 2022-02-04 DIAGNOSIS — I4821 Permanent atrial fibrillation: Secondary | ICD-10-CM

## 2022-02-04 DIAGNOSIS — Z5181 Encounter for therapeutic drug level monitoring: Secondary | ICD-10-CM

## 2022-02-04 LAB — POCT INR: INR: 3 (ref 2.0–3.0)

## 2022-02-04 NOTE — Patient Instructions (Signed)
Continue warfarin 1 1/2 tablets daily except 1 tablet on Mondays and Thursdays Recheck in 5 wk

## 2022-02-17 DIAGNOSIS — Z6838 Body mass index (BMI) 38.0-38.9, adult: Secondary | ICD-10-CM | POA: Diagnosis not present

## 2022-02-17 DIAGNOSIS — I10 Essential (primary) hypertension: Secondary | ICD-10-CM | POA: Diagnosis not present

## 2022-02-17 DIAGNOSIS — Z299 Encounter for prophylactic measures, unspecified: Secondary | ICD-10-CM | POA: Diagnosis not present

## 2022-02-17 DIAGNOSIS — Z713 Dietary counseling and surveillance: Secondary | ICD-10-CM | POA: Diagnosis not present

## 2022-02-17 DIAGNOSIS — E1165 Type 2 diabetes mellitus with hyperglycemia: Secondary | ICD-10-CM | POA: Diagnosis not present

## 2022-02-19 ENCOUNTER — Ambulatory Visit: Payer: Medicare Other | Attending: Cardiology

## 2022-02-19 DIAGNOSIS — I350 Nonrheumatic aortic (valve) stenosis: Secondary | ICD-10-CM | POA: Diagnosis not present

## 2022-02-19 LAB — ECHOCARDIOGRAM COMPLETE
AR max vel: 1.87 cm2
AV Area VTI: 1.68 cm2
AV Area mean vel: 1.85 cm2
AV Mean grad: 17.8 mmHg
AV Peak grad: 28.9 mmHg
Ao pk vel: 2.69 m/s
Calc EF: 61.8 %
MV M vel: 3.54 m/s
MV Peak grad: 50.2 mmHg
P 1/2 time: 650 msec
S' Lateral: 3.1 cm
Single Plane A2C EF: 63.3 %
Single Plane A4C EF: 62.4 %

## 2022-03-01 NOTE — Progress Notes (Signed)
Cardiology Office Note  Date: 03/02/2022   ID: [name] [name], DOB [birth year], MRN [id number]  PCP:  [name], MD  Cardiologist:  [name], MD Electrophysiologist:  None   Chief Complaint  Patient presents with   Cardiac follow-up    History of Present Illness: [name] [name] is an 87 y.o. male who last seen in May.  He is here for a routine visit.  Reports no sense of palpitations or chest pain.  Stable NYHA class II dyspnea with typical activities.  He continues on Coumadin with follow-up in the anticoagulation clinic.  Last INR was 3.0.  He does not report any spontaneous bleeding problems.  Recent echocardiogram revealed LVEF 60 to 65%, normal estimated RVSP, severe left atrial enlargement, mild mitral regurgitation, and mild aortic stenosis with mean gradient approximately 18 mmHg and dimensionless index 0.44.  Also mild aortic regurgitation.  I discussed the results with him today.  I personally reviewed his ECG today which shows rate controlled atrial fibrillation with right bundle branch block and left anterior fascicular block.  Past Medical History:  Diagnosis Date   Atrial fibrillation ([place])    Chronic lymphocytic leukemia ([place])    Coronary atherosclerosis of native coronary artery    Nonobstructive   Essential hypertension    Hyperlipidemia    IgA nephropathy    Type 2 diabetes mellitus ([place])    UTI (urinary tract infection)     Past Surgical History:  Procedure Laterality Date   AMPUTATION Left 09/15/2012   Procedure: PARTIAL AMPUTATION 3rd TOE LEFT FOOT;  Surgeon: Marcheta Grammes, DPM;  Location: [facility];  Service: Orthopedics;  Laterality: Left;   AMPUTATION Left 04/20/2013   Procedure: PARTIAL AMPUTATION 2ND TOE LEFT FOOT;  Surgeon: Marcheta Grammes, DPM;  Location: [facility];  Service: Orthopedics;  Laterality: Left;   ANAL FISSURE REPAIR     APPLICATION OF WOUND VAC Right 09/09/2016   Procedure: APPLICATION OF WOUND VAC;  Surgeon:  Caprice Beaver, DPM;  Location: [facility];  Service: Podiatry;  Laterality: Right;   CATARACT EXTRACTION W/PHACO Right 09/30/2015   Procedure: CATARACT EXTRACTION PHACO AND INTRAOCULAR LENS PLACEMENT (IOC);  Surgeon: Tonny Branch, MD;  Location: [facility];  Service: Ophthalmology;  Laterality: Right;  CDE: 11.78   CATARACT EXTRACTION W/PHACO Left 10/31/2015   Procedure: CATARACT EXTRACTION PHACO AND INTRAOCULAR LENS PLACEMENT LEFT EYE; CDE:  9.10;  Surgeon: Tonny Branch, MD;  Location: [facility];  Service: Ophthalmology;  Laterality: Left;   COLONOSCOPY  08/19/2011   Procedure: COLONOSCOPY;  Surgeon: Rogene Houston, MD;  [facility];  Service: Endoscopy;  Laterality: N/A;  930   COLONOSCOPY N/A 09/19/2014   Procedure: COLONOSCOPY;  Surgeon: Rogene Houston, MD;  [facility];  Service: Endoscopy;  Laterality: N/A;  1055   FEMORAL HERNIA REPAIR     INCISION AND DRAINAGE Right 09/09/2016   Procedure: DEBRIDEMENT WOUND RT heel with wound vac attachment;  Surgeon: Caprice Beaver, DPM;  Location: [facility];  Service: Podiatry;  Laterality: Right;   INCISION AND DRAINAGE ABSCESS Right 02/16/2018   Procedure: INCISION AND DRAINAGE ABSCESS;  Surgeon: Leta Baptist, MD;  Location: [place];  Service: ENT;  Laterality: Right;   INCISION AND DRAINAGE OF WOUND Right 08/12/2016   Procedure: DEBRIDEMENT WOUND RIGHT HEEL;  Surgeon: Caprice Beaver, DPM;  Location: [facility];  Service: Podiatry;  Laterality: Right;  right heel   MASS EXCISION Right 09/28/2017   Procedure: EXCISION OF EXTERNAL AUDITORY CANAL  MASS;  Surgeon: Leta Baptist, MD;  Location: [place];  Service: ENT;  Laterality: Right;   MASTOIDECTOMY Right 02/16/2018   Procedure: REVISION MASTOIDECTOMY;  Surgeon: Leta Baptist, MD;  Location: [place];  Service: ENT;  Laterality: Right;   Open repair left quadriceps tendon.  08/22/07   Dr. Aline Brochure   Right total knee replacement     TRANSURETHRAL RESECTION OF PROSTATE     TYMPANOMASTOIDECTOMY  Right 11/10/2017   Procedure: RIGHT TYMPANOMASTOIDECTOMY;  Surgeon: Leta Baptist, MD;  Location: [place];  Service: ENT;  Laterality: Right;   WOUND EXPLORATION Right 08/12/2016   Procedure: EXPLORATION OF WOUND FOR FOREIGN BODY RIGHT HEEL;  Surgeon: Caprice Beaver, DPM;  Location: [facility];  Service: Podiatry;  Laterality: Right;  right heel    Current Outpatient Medications  Medication Sig Dispense Refill   ACCU-CHEK SMARTVIEW test strip USE 1 STRIP TO CHECK GLUCOSE THREE TIMES DAILY FOR FLUCTUATING BLOOD SUGAR     amLODipine (NORVASC) 10 MG tablet Take 10 mg by mouth daily.     Ascorbic Acid (VITAMIN C) 1000 MG tablet Take 1,000 mg by mouth daily with breakfast.     atorvastatin (LIPITOR) 10 MG tablet Take 10 mg by mouth every evening.     Calcium Carb-Cholecalciferol 600-800 MG-UNIT TABS Take 2 tablets by mouth daily with breakfast.     cholecalciferol (VITAMIN D) 1000 units tablet Take 1,000 Units by mouth every morning.     ciclopirox (LOPROX) 0.77 % cream Apply 1 application topically every morning. APPLY TO THE FEET     Coenzyme Q10 (CO Q-10) 100 MG CAPS Take 100 mg by mouth daily with breakfast.      digoxin (LANOXIN) 0.125 MG tablet Take 0.125 mg by mouth daily with breakfast.      finasteride (PROSCAR) 5 MG tablet Take 1 tablet (5 mg total) by mouth every morning. 90 tablet 3   furosemide (LASIX) 40 MG tablet Take 40 mg by mouth daily.     HUMALOG KWIKPEN 100 UNIT/ML KwikPen Inject 1-9 Units into the skin 3 (three) times daily. CBG 121 - 150: 1 unit,  CBG 151 - 200: 2 units,  CBG 201 - 250: 3 units,  CBG 251 - 300: 5 units,  CBG 301 - 350: 7 units,  CBG 351 - 400: 9 units     Insulin Detemir (LEVEMIR FLEXTOUCH) 100 UNIT/ML Pen Inject 20 Units into the skin daily. (Patient taking differently: Inject 60 Units into the skin daily.)     lisinopril (PRINIVIL,ZESTRIL) 20 MG tablet Take 40 mg by mouth daily with supper.     lisinopril (ZESTRIL) 40 MG tablet Take 1 tablet every  day by oral route.     LORazepam (ATIVAN) 1 MG tablet Take 1 mg by mouth 2 (two) times daily as needed.     Multiple Vitamin (MULTIVITAMIN WITH MINERALS) TABS tablet Take 1 tablet by mouth daily with breakfast.     Omega-3 Fatty Acids (FISH OIL) 1200 MG CAPS Take 2 capsules by mouth 2 (two) times daily.     oxyCODONE-acetaminophen (PERCOCET/ROXICET) 5-325 MG tablet      pioglitazone (ACTOS) 15 MG tablet Take 7.5 mg by mouth every evening.      polyethylene glycol (MIRALAX / GLYCOLAX) packet Take 17 g by mouth daily.     potassium chloride SA (K-DUR,KLOR-CON) 20 MEQ tablet Take 40 mEq by mouth daily. Take starting 01/07/2018     tamsulosin (FLOMAX) 0.4 MG CAPS capsule Take 1 capsule (  0.4 mg total) by mouth daily after supper. 90 capsule 3   VICTOZA 18 MG/3ML SOPN Inject 1.8 mg into the skin daily with breakfast.     vitamin E 400 UNIT capsule Take 400 Units by mouth every morning.     warfarin (COUMADIN) 5 MG tablet TAKE 1 & 1/2 (ONE & ONE-HALF) TABLETS BY MOUTH ONCE DAILY EXCEPT 1 TABLET ON MONDAYS AND THURSDAYS OR  AS  DIRECTED  BY  ANTICOAGULATION  CLINIC 135 tablet 3   No current facility-administered medications for this visit.   Allergies:  Erythromycin and Penicillins   ROS:  No syncope.  Physical Exam: VS:  BP (!) 138/52   Pulse (!) 59   Ht 6' (1.829 m)   Wt 275 lb 3.2 oz (124.8 kg)   SpO2 93%   BMI 37.32 kg/m , BMI Body mass index is 37.32 kg/m.  Wt Readings from Last 3 Encounters:  03/02/22 275 lb 3.2 oz (124.8 kg)  11/06/21 263 lb (119.3 kg)  08/19/21 270 lb 6.4 oz (122.7 kg)    General: Patient appears comfortable at rest. HEENT: Conjunctiva and lids normal. Neck: Supple, no elevated JVP or carotid bruits. Lungs: Clear to auscultation, nonlabored breathing at rest. Cardiac: Irregularly irregular, 2 3/6 systolic murmur. Extremities: Stable appearing lower leg edema.  ECG:  An ECG dated 02/25/2021 was personally reviewed today and demonstrated:  Atrial fibrillation  with right bundle branch block and left anterior fascicular block, increased voltage.  Recent Labwork:  October 2023: Hemoglobin 12.1, platelets 144, BUN 23, creatinine 0.93, potassium 4.2, AST 18, ALT 12, cholesterol 137, triglycerides 66, HDL 42, LDL 81, TSH 2.5  Other Studies Reviewed Today:  Echocardiogram 02/19/2022:  1. Left ventricular ejection fraction, by estimation, is 60 to 65%. The  left ventricle has normal function. The left ventricle has no regional  wall motion abnormalities. There is mild left ventricular hypertrophy.  Left ventricular diastolic parameters  are indeterminate. The average left ventricular global longitudinal strain  is -25.2 %. The global longitudinal strain is normal.   2. RV-RA gradient 27 mmHg suggests normal to mildly increased RVSP  depending on CVP. Right ventricular systolic function is normal. The right  ventricular size is normal.   3. Left atrial size was severely dilated.   4. Right atrial size was moderately dilated.   5. The mitral valve is grossly normal. Mild mitral valve regurgitation.   6. The aortic valve is tricuspid. There is moderate calcification of the  aortic valve. Aortic valve regurgitation is mild. Mild aortic valve  stenosis. Aortic regurgitation PHT measures 650 msec. Aortic valve mean  gradient measures 17.8 mmHg.  Dimentionless index 0.44.   7. Unable to estimate CVP.   Assessment and Plan:  1.  Permanent atrial fibrillation with CHA2DS2-VASc score of 5.  He is asymptomatic in terms of palpitations and remains on Lanoxin as well as Coumadin.  Last INR 3.0.  ECG reviewed.  No changes were made today.  2.  Mild aortic stenosis and mild aortic regurgitation by follow-up echocardiogram.  He is asymptomatic at this time, LVEF remains normal at 60 to 65%.  Continue to follow clinically, can consider reimaging in the next 2 to 3 years, sooner if needed.  Medication Adjustments/Labs and Tests Ordered: Current medicines are  reviewed at length with the patient today.  Concerns regarding medicines are outlined above.   Tests Ordered: Orders Placed This Encounter  Procedures   EKG 12-Lead    Medication Changes: No orders of the  defined types were placed in this encounter.   Disposition:  Follow up  6 months.  Signed, Satira Sark, MD, [facility] 03/02/2022 10:43 AM    [facility] [place], [place], [place] [address] Phone: [phone]; Fax: [phone]

## 2022-03-02 ENCOUNTER — Ambulatory Visit: Payer: Medicare Other | Attending: Cardiology | Admitting: Cardiology

## 2022-03-02 ENCOUNTER — Encounter: Payer: Self-pay | Admitting: Cardiology

## 2022-03-02 VITALS — BP 138/52 | HR 59 | Ht 72.0 in | Wt 275.2 lb

## 2022-03-02 DIAGNOSIS — I350 Nonrheumatic aortic (valve) stenosis: Secondary | ICD-10-CM | POA: Insufficient documentation

## 2022-03-02 DIAGNOSIS — I4821 Permanent atrial fibrillation: Secondary | ICD-10-CM | POA: Insufficient documentation

## 2022-03-02 NOTE — Patient Instructions (Signed)

## 2022-03-05 ENCOUNTER — Telehealth: Payer: Self-pay

## 2022-03-05 NOTE — Telephone Encounter (Signed)
Patient called stating that he received a bill for $16.00 Account # [id number].He said that he didn't think he should have to pay this.

## 2022-03-11 ENCOUNTER — Ambulatory Visit: Payer: Medicare Other | Attending: Cardiology | Admitting: *Deleted

## 2022-03-11 DIAGNOSIS — I4821 Permanent atrial fibrillation: Secondary | ICD-10-CM

## 2022-03-11 DIAGNOSIS — Z5181 Encounter for therapeutic drug level monitoring: Secondary | ICD-10-CM

## 2022-03-11 LAB — POCT INR: INR: 3.5 — AB (ref 2.0–3.0)

## 2022-03-11 MED ORDER — WARFARIN SODIUM 5 MG PO TABS: ORAL_TABLET | ORAL | 3 refills | Status: DC

## 2022-03-11 NOTE — Patient Instructions (Signed)
Hold warfarin tonight then decrease dose to 1 1/2 tablets daily except 1 tablet on Mondays, Wednesdays and Fridays Recheck in 5 wk

## 2022-03-23 ENCOUNTER — Telehealth: Payer: Self-pay | Admitting: Cardiology

## 2022-03-23 NOTE — Telephone Encounter (Signed)
Vicky, Can you help with this.  I know you tried to help him before. Thanks

## 2022-03-23 NOTE — Telephone Encounter (Signed)
Pt states he would like to discuss his coumadin appt with Edrick Oh, RN. He states that he was denied coverage for his appt and would like to discuss this.

## 2022-03-23 NOTE — Telephone Encounter (Signed)
Patient was calling back. Please advise  

## 2022-03-24 DIAGNOSIS — B351 Tinea unguium: Secondary | ICD-10-CM | POA: Diagnosis not present

## 2022-03-24 DIAGNOSIS — L84 Corns and callosities: Secondary | ICD-10-CM | POA: Diagnosis not present

## 2022-03-24 DIAGNOSIS — E1142 Type 2 diabetes mellitus with diabetic polyneuropathy: Secondary | ICD-10-CM | POA: Diagnosis not present

## 2022-03-24 NOTE — Telephone Encounter (Signed)
Spoke with Lucy Chris thru billing. Suanne Marker is going to have to check with Glenard Haring in regards to this charge.  Called and spoke with Mrs Deskin. Explained to her that our billing department is looking into this matter. She understood and appreciated the telephone call.

## 2022-04-15 ENCOUNTER — Ambulatory Visit: Payer: Medicare Other | Attending: Cardiology | Admitting: *Deleted

## 2022-04-15 DIAGNOSIS — I4821 Permanent atrial fibrillation: Secondary | ICD-10-CM

## 2022-04-15 DIAGNOSIS — Z5181 Encounter for therapeutic drug level monitoring: Secondary | ICD-10-CM | POA: Diagnosis not present

## 2022-04-15 LAB — POCT INR: INR: 2.6 (ref 2.0–3.0)

## 2022-04-15 NOTE — Patient Instructions (Signed)
Continue warfarin 1 1/2 tablets daily except 1 tablet on Mondays, Wednesdays and Fridays Recheck in 6 wk 

## 2022-05-22 DIAGNOSIS — Z23 Encounter for immunization: Secondary | ICD-10-CM | POA: Diagnosis not present

## 2022-05-26 DIAGNOSIS — E1165 Type 2 diabetes mellitus with hyperglycemia: Secondary | ICD-10-CM | POA: Diagnosis not present

## 2022-05-26 DIAGNOSIS — I4891 Unspecified atrial fibrillation: Secondary | ICD-10-CM | POA: Diagnosis not present

## 2022-05-26 DIAGNOSIS — I10 Essential (primary) hypertension: Secondary | ICD-10-CM | POA: Diagnosis not present

## 2022-05-26 DIAGNOSIS — Z299 Encounter for prophylactic measures, unspecified: Secondary | ICD-10-CM | POA: Diagnosis not present

## 2022-05-26 DIAGNOSIS — C9111 Chronic lymphocytic leukemia of B-cell type in remission: Secondary | ICD-10-CM | POA: Diagnosis not present

## 2022-05-27 ENCOUNTER — Ambulatory Visit: Payer: Medicare Other | Attending: Cardiology | Admitting: *Deleted

## 2022-05-27 DIAGNOSIS — I4821 Permanent atrial fibrillation: Secondary | ICD-10-CM | POA: Diagnosis not present

## 2022-05-27 DIAGNOSIS — Z5181 Encounter for therapeutic drug level monitoring: Secondary | ICD-10-CM | POA: Diagnosis not present

## 2022-05-27 LAB — POCT INR: INR: 2.6 (ref 2.0–3.0)

## 2022-05-27 NOTE — Patient Instructions (Signed)
Continue warfarin 1 1/2 tablets daily except 1 tablet on Mondays, Wednesdays and Fridays Recheck in 6 wk

## 2022-06-01 DIAGNOSIS — I10 Essential (primary) hypertension: Secondary | ICD-10-CM | POA: Diagnosis not present

## 2022-06-01 DIAGNOSIS — Z6839 Body mass index (BMI) 39.0-39.9, adult: Secondary | ICD-10-CM | POA: Diagnosis not present

## 2022-06-01 DIAGNOSIS — R350 Frequency of micturition: Secondary | ICD-10-CM | POA: Diagnosis not present

## 2022-06-01 DIAGNOSIS — N419 Inflammatory disease of prostate, unspecified: Secondary | ICD-10-CM | POA: Diagnosis not present

## 2022-06-01 DIAGNOSIS — Z299 Encounter for prophylactic measures, unspecified: Secondary | ICD-10-CM | POA: Diagnosis not present

## 2022-06-02 DIAGNOSIS — B351 Tinea unguium: Secondary | ICD-10-CM | POA: Diagnosis not present

## 2022-06-02 DIAGNOSIS — L84 Corns and callosities: Secondary | ICD-10-CM | POA: Diagnosis not present

## 2022-06-02 DIAGNOSIS — E1142 Type 2 diabetes mellitus with diabetic polyneuropathy: Secondary | ICD-10-CM | POA: Diagnosis not present

## 2022-06-04 ENCOUNTER — Encounter: Payer: Self-pay | Admitting: Radiology

## 2022-07-08 ENCOUNTER — Ambulatory Visit: Payer: Medicare Other | Attending: Cardiology | Admitting: *Deleted

## 2022-07-08 DIAGNOSIS — Z5181 Encounter for therapeutic drug level monitoring: Secondary | ICD-10-CM | POA: Diagnosis not present

## 2022-07-08 DIAGNOSIS — I4821 Permanent atrial fibrillation: Secondary | ICD-10-CM | POA: Diagnosis not present

## 2022-07-08 LAB — POCT INR: INR: 2.5 (ref 2.0–3.0)

## 2022-07-08 NOTE — Patient Instructions (Signed)
Continue warfarin 1 1/2 tablets daily except 1 tablet on Mondays, Wednesdays and Fridays Recheck in 6 wk 

## 2022-08-03 ENCOUNTER — Telehealth: Payer: Self-pay | Admitting: Medical Oncology

## 2022-08-03 NOTE — Telephone Encounter (Signed)
error-wrong pt

## 2022-08-11 DIAGNOSIS — L84 Corns and callosities: Secondary | ICD-10-CM | POA: Diagnosis not present

## 2022-08-11 DIAGNOSIS — E1142 Type 2 diabetes mellitus with diabetic polyneuropathy: Secondary | ICD-10-CM | POA: Diagnosis not present

## 2022-08-11 DIAGNOSIS — B351 Tinea unguium: Secondary | ICD-10-CM | POA: Diagnosis not present

## 2022-08-20 ENCOUNTER — Ambulatory Visit: Payer: Medicare Other | Attending: Cardiology | Admitting: *Deleted

## 2022-08-20 DIAGNOSIS — I4821 Permanent atrial fibrillation: Secondary | ICD-10-CM | POA: Diagnosis not present

## 2022-08-20 DIAGNOSIS — Z5181 Encounter for therapeutic drug level monitoring: Secondary | ICD-10-CM | POA: Diagnosis not present

## 2022-08-20 LAB — POCT INR: POC INR: 3.1

## 2022-08-20 NOTE — Patient Instructions (Addendum)
Description   Take 1/2 a tablet of warfarin today (2.5mg ) and then continue warfarin 1 1/2 (7.5mg ) tablets daily except 1 tablet (5mg ) on Mondays, Wednesdays and Fridays Recheck in 4 wk

## 2022-08-22 ENCOUNTER — Other Ambulatory Visit: Payer: Self-pay | Admitting: Urology

## 2022-08-22 DIAGNOSIS — R3915 Urgency of urination: Secondary | ICD-10-CM

## 2022-08-22 DIAGNOSIS — N400 Enlarged prostate without lower urinary tract symptoms: Secondary | ICD-10-CM

## 2022-08-27 DIAGNOSIS — H7011 Chronic mastoiditis, right ear: Secondary | ICD-10-CM | POA: Diagnosis not present

## 2022-08-27 DIAGNOSIS — H903 Sensorineural hearing loss, bilateral: Secondary | ICD-10-CM | POA: Diagnosis not present

## 2022-08-27 DIAGNOSIS — H838X3 Other specified diseases of inner ear, bilateral: Secondary | ICD-10-CM | POA: Diagnosis not present

## 2022-09-01 DIAGNOSIS — I10 Essential (primary) hypertension: Secondary | ICD-10-CM | POA: Diagnosis not present

## 2022-09-01 DIAGNOSIS — E1165 Type 2 diabetes mellitus with hyperglycemia: Secondary | ICD-10-CM | POA: Diagnosis not present

## 2022-09-01 DIAGNOSIS — Z299 Encounter for prophylactic measures, unspecified: Secondary | ICD-10-CM | POA: Diagnosis not present

## 2022-09-14 NOTE — Progress Notes (Unsigned)
Cardiology Office Note  Date: 09/15/2022   ID: [name] [name], DOB [birth year], MRN [id number]  History of Present Illness: [name] [name] is an 88 y.o. male last seen in November 2023.  He is here for a routine visit.  He does not report any sense of palpitations at this time, no exertional chest pain with typical activities.  He is using a walker, denies any recent falls.  He remains on Coumadin with follow-up in the anticoagulation clinic.  No reported spontaneous bleeding problems, INR 3.2 today with adjustments made.  I went over his medications which are otherwise stable from a cardiac perspective.  He continues to follow with Dr. Sherryll Burger.  Echocardiogram from November of last year showed mild aortic stenosis and mild aortic regurgitation, asymptomatic at this time.  Physical Exam: VS:  BP (!) 132/58   Pulse (!) 53   Ht 6' (1.829 m)   Wt 270 lb 3.2 oz (122.6 kg)   SpO2 97%   BMI 36.65 kg/m , BMI Body mass index is 36.65 kg/m.  Wt Readings from Last 3 Encounters:  09/15/22 270 lb 3.2 oz (122.6 kg)  03/02/22 275 lb 3.2 oz (124.8 kg)  11/06/21 263 lb (119.3 kg)    General: Patient appears comfortable at rest. HEENT: Conjunctiva and lids normal. Neck: Supple, no elevated JVP or carotid bruits. Lungs: Clear to auscultation, nonlabored breathing at rest. Cardiac: Irregularly irregular with 2-3/6 systolic murmur.. Extremities: Stable lower leg edema.  ECG:  An ECG dated 03/02/2022 was personally reviewed today and demonstrated:  Rate controlled atrial fibrillation with right bundle branch block and left anterior fascicular block.  Labwork:  October 2023: Hemoglobin 12.1, platelets 144, BUN 23, creatinine 0.93, potassium 4.2, AST 18, ALT 12, cholesterol 137, triglycerides 66, HDL 42, LDL 81, TSH 2.5  Other Studies Reviewed Today:  Echocardiogram 02/19/2022:  1. Left ventricular ejection fraction, by estimation, is 60 to 65%. The  left ventricle has normal function. The left  ventricle has no regional  wall motion abnormalities. There is mild left ventricular hypertrophy.  Left ventricular diastolic parameters  are indeterminate. The average left ventricular global longitudinal strain  is -25.2 %. The global longitudinal strain is normal.   2. RV-RA gradient 27 mmHg suggests normal to mildly increased RVSP  depending on CVP. Right ventricular systolic function is normal. The right  ventricular size is normal.   3. Left atrial size was severely dilated.   4. Right atrial size was moderately dilated.   5. The mitral valve is grossly normal. Mild mitral valve regurgitation.   6. The aortic valve is tricuspid. There is moderate calcification of the  aortic valve. Aortic valve regurgitation is mild. Mild aortic valve  stenosis. Aortic regurgitation PHT measures 650 msec. Aortic valve mean  gradient measures 17.8 mmHg.  Dimentionless index 0.44.   7. Unable to estimate CVP.   Assessment and Plan:  1.  Permanent atrial fibrillation with CHA2DS2-VASc score of 5.  Asymptomatic with no sense of palpitations and heart rate controlled on current regimen.  He remains on Coumadin for stroke prophylaxis with follow-up in the anticoagulation clinic, also on low-dose Lanoxin which she has tolerated long-term.  2.  Degenerative calcific aortic stenosis, mild by echocardiogram in November 2023, mean AV gradient approximately 18 mmHg at that time.  Also has mild aortic regurgitation.  Asymptomatic, no indication for follow-up echocardiogram this year.  3.  Essential hypertension.  Blood pressure control is reasonable today.  Continue Norvasc and lisinopril.  4.  Mixed hyperlipidemia on Lipitor.  LDL 81 in October 2023.  Disposition:  Follow up  6 months.  Signed, Jonelle Sidle, M.D., F.A.C.C. [facility]

## 2022-09-15 ENCOUNTER — Ambulatory Visit: Payer: Medicare Other | Attending: Cardiology | Admitting: Cardiology

## 2022-09-15 ENCOUNTER — Encounter: Payer: Self-pay | Admitting: Cardiology

## 2022-09-15 ENCOUNTER — Ambulatory Visit (INDEPENDENT_AMBULATORY_CARE_PROVIDER_SITE_OTHER): Payer: Medicare Other | Admitting: *Deleted

## 2022-09-15 VITALS — BP 132/58 | HR 53 | Ht 72.0 in | Wt 270.2 lb

## 2022-09-15 DIAGNOSIS — I10 Essential (primary) hypertension: Secondary | ICD-10-CM | POA: Insufficient documentation

## 2022-09-15 DIAGNOSIS — Z5181 Encounter for therapeutic drug level monitoring: Secondary | ICD-10-CM

## 2022-09-15 DIAGNOSIS — I4821 Permanent atrial fibrillation: Secondary | ICD-10-CM | POA: Diagnosis not present

## 2022-09-15 DIAGNOSIS — I350 Nonrheumatic aortic (valve) stenosis: Secondary | ICD-10-CM | POA: Diagnosis not present

## 2022-09-15 LAB — POCT INR: INR: 3.2 — AB (ref 2.0–3.0)

## 2022-09-15 NOTE — Patient Instructions (Addendum)

## 2022-09-15 NOTE — Patient Instructions (Signed)
Decrease warfarin to 1 tablet daily except 1 1/2 tablets on Sundays and Thursdays Recheck in 4 wk

## 2022-09-30 DIAGNOSIS — R058 Other specified cough: Secondary | ICD-10-CM | POA: Diagnosis not present

## 2022-09-30 DIAGNOSIS — I10 Essential (primary) hypertension: Secondary | ICD-10-CM | POA: Diagnosis not present

## 2022-09-30 DIAGNOSIS — Z299 Encounter for prophylactic measures, unspecified: Secondary | ICD-10-CM | POA: Diagnosis not present

## 2022-09-30 DIAGNOSIS — J40 Bronchitis, not specified as acute or chronic: Secondary | ICD-10-CM | POA: Diagnosis not present

## 2022-10-13 ENCOUNTER — Ambulatory Visit: Payer: Medicare Other | Attending: Cardiology | Admitting: *Deleted

## 2022-10-13 DIAGNOSIS — Z5181 Encounter for therapeutic drug level monitoring: Secondary | ICD-10-CM | POA: Diagnosis not present

## 2022-10-13 DIAGNOSIS — I4821 Permanent atrial fibrillation: Secondary | ICD-10-CM | POA: Diagnosis not present

## 2022-10-13 LAB — POCT INR: INR: 2.6 (ref 2.0–3.0)

## 2022-10-13 NOTE — Patient Instructions (Signed)
Continue warfarin 1 tablet daily except 1 1/2 tablets on Sundays and Thursdays Recheck in 4 wk

## 2022-10-20 DIAGNOSIS — B351 Tinea unguium: Secondary | ICD-10-CM | POA: Diagnosis not present

## 2022-10-20 DIAGNOSIS — E1142 Type 2 diabetes mellitus with diabetic polyneuropathy: Secondary | ICD-10-CM | POA: Diagnosis not present

## 2022-10-20 DIAGNOSIS — L84 Corns and callosities: Secondary | ICD-10-CM | POA: Diagnosis not present

## 2022-10-21 ENCOUNTER — Ambulatory Visit (INDEPENDENT_AMBULATORY_CARE_PROVIDER_SITE_OTHER): Payer: Medicare Other | Admitting: Urology

## 2022-10-21 VITALS — BP 121/65 | HR 68

## 2022-10-21 DIAGNOSIS — N400 Enlarged prostate without lower urinary tract symptoms: Secondary | ICD-10-CM

## 2022-10-21 DIAGNOSIS — R3915 Urgency of urination: Secondary | ICD-10-CM | POA: Diagnosis not present

## 2022-10-21 DIAGNOSIS — N401 Enlarged prostate with lower urinary tract symptoms: Secondary | ICD-10-CM

## 2022-10-21 LAB — URINALYSIS, ROUTINE W REFLEX MICROSCOPIC
Bilirubin, UA: NEGATIVE
Glucose, UA: NEGATIVE
Ketones, UA: NEGATIVE
Nitrite, UA: NEGATIVE
RBC, UA: NEGATIVE
Specific Gravity, UA: 1.015 (ref 1.005–1.030)
Urobilinogen, Ur: 1 mg/dL (ref 0.2–1.0)
pH, UA: 6 (ref 5.0–7.5)

## 2022-10-21 LAB — MICROSCOPIC EXAMINATION
Bacteria, UA: NONE SEEN
RBC, Urine: NONE SEEN /hpf (ref 0–2)

## 2022-10-21 LAB — BLADDER SCAN AMB NON-IMAGING: Scan Result: 3

## 2022-10-21 MED ORDER — TAMSULOSIN HCL 0.4 MG PO CAPS
0.4000 mg | ORAL_CAPSULE | Freq: Every day | ORAL | 3 refills | Status: DC
Start: 2022-10-21 — End: 2023-10-19

## 2022-10-21 MED ORDER — FINASTERIDE 5 MG PO TABS: 5.0000 mg | ORAL_TABLET | Freq: Every morning | ORAL | 3 refills | Status: DC

## 2022-10-21 NOTE — Progress Notes (Signed)
10/21/2022 2:40 PM   [name] 17-Feb-1935 [id number]  Referring provider: Kirstie Peri, MD [address] [place],  [place] [address]  Followup BPh with urinary urgency   HPI: Mr Napieralski is a 31JJ here for followup for BPH with urinary urgency. IPSS 8 QOl 2 on flomax and finasteride. No straining to urinate. Uirne stream strong. He has mild urgency but no urge incontinence. Nocturia 1-2x. No other complaints today   PMH: Past Medical History:  Diagnosis Date   Atrial fibrillation (HCC)    Chronic lymphocytic leukemia (HCC)    Coronary atherosclerosis of native coronary artery    Nonobstructive   Essential hypertension    Hyperlipidemia    IgA nephropathy    Type 2 diabetes mellitus (HCC)    UTI (urinary tract infection)     Surgical History: Past Surgical History:  Procedure Laterality Date   AMPUTATION Left 09/15/2012   Procedure: PARTIAL AMPUTATION 3rd TOE LEFT FOOT;  Surgeon: Dallas Schimke, DPM;  Location: [facility];  Service: Orthopedics;  Laterality: Left;   AMPUTATION Left 04/20/2013   Procedure: PARTIAL AMPUTATION 2ND TOE LEFT FOOT;  Surgeon: Dallas Schimke, DPM;  Location: [facility];  Service: Orthopedics;  Laterality: Left;   ANAL FISSURE REPAIR     APPLICATION OF WOUND VAC Right 09/09/2016   Procedure: APPLICATION OF WOUND VAC;  Surgeon: Ferman Hamming, DPM;  Location: [facility];  Service: Podiatry;  Laterality: Right;   CATARACT EXTRACTION W/PHACO Right 09/30/2015   Procedure: CATARACT EXTRACTION PHACO AND INTRAOCULAR LENS PLACEMENT (IOC);  Surgeon: Gemma Payor, MD;  Location: [facility];  Service: Ophthalmology;  Laterality: Right;  CDE: 11.78   CATARACT EXTRACTION W/PHACO Left 10/31/2015   Procedure: CATARACT EXTRACTION PHACO AND INTRAOCULAR LENS PLACEMENT LEFT EYE; CDE:  9.10;  Surgeon: Gemma Payor, MD;  Location: [facility];  Service: Ophthalmology;  Laterality: Left;   COLONOSCOPY  08/19/2011   Procedure: COLONOSCOPY;  Surgeon: Malissa Hippo, MD;  [facility]: [facility];  Service: Endoscopy;  Laterality: N/A;  930   COLONOSCOPY N/A 09/19/2014   Procedure: COLONOSCOPY;  Surgeon: Malissa Hippo, MD;  [facility];  Service: Endoscopy;  Laterality: N/A;  1055   FEMORAL HERNIA REPAIR     INCISION AND DRAINAGE Right 09/09/2016   Procedure: DEBRIDEMENT WOUND RT heel with wound vac attachment;  Surgeon: Ferman Hamming, DPM;  Location: [facility];  Service: Podiatry;  Laterality: Right;   INCISION AND DRAINAGE ABSCESS Right 02/16/2018   Procedure: INCISION AND DRAINAGE ABSCESS;  Surgeon: Newman Pies, MD;  Location: [facility];  Service: ENT;  Laterality: Right;   INCISION AND DRAINAGE OF WOUND Right 08/12/2016   Procedure: DEBRIDEMENT WOUND RIGHT HEEL;  Surgeon: Ferman Hamming, DPM;  Location: [facility];  Service: Podiatry;  Laterality: Right;  right heel   MASS EXCISION Right 09/28/2017   Procedure: EXCISION OF EXTERNAL AUDITORY CANAL MASS;  Surgeon: Newman Pies, MD;  Location: [facility];  Service: ENT;  Laterality: Right;   MASTOIDECTOMY Right 02/16/2018   Procedure: REVISION MASTOIDECTOMY;  Surgeon: Newman Pies, MD;  Location: [facility];  Service: ENT;  Laterality: Right;   Open repair left quadriceps tendon.  08/22/07   Dr. Romeo Apple   Right total knee replacement     TRANSURETHRAL RESECTION OF PROSTATE     TYMPANOMASTOIDECTOMY Right 11/10/2017   Procedure: RIGHT TYMPANOMASTOIDECTOMY;  Surgeon: Newman Pies, MD;  Location: [facility];  Service: ENT;  Laterality: Right;   WOUND EXPLORATION Right 08/12/2016  Procedure: EXPLORATION OF WOUND FOR FOREIGN BODY RIGHT HEEL;  Surgeon: Ferman Hamming, DPM;  Location: [facility];  Service: Podiatry;  Laterality: Right;  right heel    Home Medications:  Allergies as of 10/21/2022       Reactions   Erythromycin Other (See Comments)   Penicillins Hives, Other (See Comments)   Has patient had a PCN reaction causing immediate rash, facial/tongue/throat swelling, SOB or lightheadedness with  hypotension: No Has patient had a PCN reaction causing severe rash involving mucus membranes or skin necrosis: No Has patient had a PCN reaction that required hospitalization No Has patient had a PCN reaction occurring within the last 10 years: No If all of the above answers are "NO", then may proceed with Cephalosporin use. Has tolerated Rocephin        Medication List        Accurate as of October 21, 2022  2:40 PM. If you have any questions, ask your nurse or doctor.          Accu-Chek SmartView test strip Generic drug: glucose blood USE 1 STRIP TO CHECK GLUCOSE THREE TIMES DAILY FOR FLUCTUATING BLOOD SUGAR   amLODipine 10 MG tablet Commonly known as: NORVASC Take 10 mg by mouth daily.   atorvastatin 10 MG tablet Commonly known as: LIPITOR Take 10 mg by mouth every evening.   Calcium Carb-Cholecalciferol 600-800 MG-UNIT Tabs Take 2 tablets by mouth daily with breakfast.   cholecalciferol 1000 units tablet Commonly known as: VITAMIN D Take 1,000 Units by mouth every morning.   ciclopirox 0.77 % cream Commonly known as: LOPROX Apply 1 application topically every morning. APPLY TO THE FEET   Co Q-10 100 MG Caps Take 100 mg by mouth daily with breakfast.   digoxin 0.125 MG tablet Commonly known as: LANOXIN Take 0.125 mg by mouth daily with breakfast.   finasteride 5 MG tablet Commonly known as: PROSCAR Take 1 tablet (5 mg total) by mouth every morning.   Fish Oil 1200 MG Caps Take 2 capsules by mouth 2 (two) times daily.   furosemide 40 MG tablet Commonly known as: LASIX Take 40 mg by mouth daily.   HumaLOG KwikPen 100 UNIT/ML KwikPen Generic drug: insulin lispro Inject 1-9 Units into the skin 3 (three) times daily. CBG 121 - 150: 1 unit,  CBG 151 - 200: 2 units,  CBG 201 - 250: 3 units,  CBG 251 - 300: 5 units,  CBG 301 - 350: 7 units,  CBG 351 - 400: 9 units   insulin detemir 100 UNIT/ML FlexPen Commonly known as: Levemir FlexTouch Inject 20 Units into  the skin daily. What changed: how much to take   lisinopril 40 MG tablet Commonly known as: ZESTRIL Take 1 tablet every day by oral route.   LORazepam 1 MG tablet Commonly known as: ATIVAN Take 1 mg by mouth 2 (two) times daily as needed.   multivitamin with minerals Tabs tablet Take 1 tablet by mouth daily with breakfast.   polyethylene glycol 17 g packet Commonly known as: MIRALAX / GLYCOLAX Take 17 g by mouth daily.   potassium chloride SA 20 MEQ tablet Commonly known as: KLOR-CON M Take 40 mEq by mouth daily. Take starting 01/07/2018   tamsulosin 0.4 MG Caps capsule Commonly known as: FLOMAX Take 1 capsule (0.4 mg total) by mouth daily after supper. What changed: See the new instructions.   Victoza 18 MG/3ML Sopn Generic drug: liraglutide Inject 1.8 mg into the skin daily with breakfast.  vitamin C 1000 MG tablet Take 1,000 mg by mouth daily with breakfast.   vitamin E 180 MG (400 UNITS) capsule Take 400 Units by mouth every morning.   warfarin 5 MG tablet Commonly known as: COUMADIN Take as directed by the anticoagulation clinic. If you are unsure how to take this medication, talk to your nurse or doctor. Original instructions: TAKE 1 & 1/2 (ONE & ONE-HALF) TABLETS BY MOUTH ONCE DAILY EXCEPT 1 TABLET ON MONDAYS, WEDNESDAYS AND FRIDAYS OR  AS  DIRECTED  BY  ANTICOAGULATION  CLINIC        Allergies:  Allergies  Allergen Reactions   Erythromycin Other (See Comments)   Penicillins Hives and Other (See Comments)    Has patient had a PCN reaction causing immediate rash, facial/tongue/throat swelling, SOB or lightheadedness with hypotension: No Has patient had a PCN reaction causing severe rash involving mucus membranes or skin necrosis: No Has patient had a PCN reaction that required hospitalization No Has patient had a PCN reaction occurring within the last 10 years: No If all of the above answers are "NO", then may proceed with Cephalosporin use.  Has  tolerated Rocephin    Family History: Family History  Problem Relation Age of Onset   Atrial fibrillation Mother     Social History:  reports that he quit smoking about 59 years ago. His smoking use included cigarettes. He started smoking about 69 years ago. He has never used smokeless tobacco. He reports that he does not drink alcohol and does not use drugs.  ROS: All other review of systems were reviewed and are negative except what is noted above in HPI  Physical Exam: BP 121/65   Pulse 68   Constitutional:  Alert and oriented, No acute distress. HEENT: [place] AT, moist mucus membranes.  Trachea midline, no masses. Cardiovascular: No clubbing, cyanosis, or edema. Respiratory: Normal respiratory effort, no increased work of breathing. GI: Abdomen is soft, nontender, nondistended, no abdominal masses GU: No CVA tenderness.  Lymph: No cervical or inguinal lymphadenopathy. Skin: No rashes, bruises or suspicious lesions. Neurologic: Grossly intact, no focal deficits, moving all 4 extremities. Psychiatric: Normal mood and affect.  Laboratory Data: Lab Results  Component Value Date   WBC 11.1 (H) 03/26/2018   HGB 10.7 (L) 03/26/2018   HCT 34.1 (L) 03/26/2018   MCV 90.2 03/26/2018   PLT 153 03/26/2018    Lab Results  Component Value Date   CREATININE 0.76 03/31/2018    No results found for: "PSA"  No results found for: "TESTOSTERONE"  Lab Results  Component Value Date   HGBA1C 6.0 (H) 03/31/2018    Urinalysis    Component Value Date/Time   COLORURINE YELLOW 12/14/2017 0050   APPEARANCEUR Clear 10/15/2021 1543   LABSPEC 1.008 12/14/2017 0050   PHURINE 6.0 12/14/2017 0050   GLUCOSEU Negative 10/15/2021 1543   HGBUR NEGATIVE 12/14/2017 0050   BILIRUBINUR Negative 10/15/2021 1543   KETONESUR NEGATIVE 12/14/2017 0050   PROTEINUR 2+ (A) 10/15/2021 1543   PROTEINUR NEGATIVE 12/14/2017 0050   NITRITE Negative 10/15/2021 1543   NITRITE NEGATIVE 12/14/2017 0050    LEUKOCYTESUR Negative 10/15/2021 1543    Lab Results  Component Value Date   LABMICR See below: 10/15/2021   WBCUA None seen 10/15/2021   LABEPIT None seen 10/15/2021   MUCUS Present 10/15/2021   BACTERIA None seen 10/15/2021    Pertinent Imaging:  No results found for this or any previous visit.  No results found for this or any previous  visit.  No results found for this or any previous visit.  No results found for this or any previous visit.  Results for orders placed during the hospital encounter of 06/20/16  US Renal  Narrative CLINICAL DATA:  Gross hematuria and urinary tract infections  EXAM: RENAL / URINARY TRACT ULTRASOUND COMPLETE  COMPARISON:  None.  FINDINGS: Right Kidney:  Length: 13.6 cm. Echogenicity and renal cortical thickness are within normal limits. No perinephric fluid or hydronephrosis visualized. There is a cyst arising from the upper pole the right kidney measuring 5.3 x 3.9 x 4.3 cm. A cyst arising from the lower pole of the right kidney measures 2.3 x 2.6 x 2.7 cm. No sonographically demonstrable calculus or ureterectasis.  Left Kidney:  Length: 13.2 cm. Echogenicity and renal cortical thickness are within normal limits. No perinephric fluid or hydronephrosis visualized. There is a cyst arising from the upper pole of the left kidney measuring 2.6 x 2.4 x 3.7 cm. There is a nearby cyst measuring 1.9 x 1.7 x 1.7 cm. No sonographically demonstrable calculus or ureterectasis.  Bladder:  There is irregular debris noted in the urinary bladder. Urinary bladder wall appears mildly thickened. Flow from the distal ureters is seen in the bladder. The patient was unable to void.  IMPRESSION: Complex debris noted within the urinary bladder with mild urinary bladder wall thickening. Suspect cystitis. Note that patient was unable to void; a finding concerning for bladder outlet obstruction. Etiology for bladder outlet obstruction  uncertain.  Cysts noted in each kidney.  No obstructing focus in either kidney.   Electronically Signed By: Bretta Bang III M.D. On: 06/23/2016 11:18  No valid procedures specified. No results found for this or any previous visit.  No results found for this or any previous visit.   Assessment & Plan:    1. Benign prostatic hyperplasia, unspecified whether lower urinary tract symptoms present  - Urinalysis, Routine w reflex microscopic - BLADDER SCAN AMB NON-IMAGING - finasteride (PROSCAR) 5 MG tablet; Take 1 tablet (5 mg total) by mouth every morning.  Dispense: 90 tablet; Refill: 3  2. Urinary urgency  - tamsulosin (FLOMAX) 0.4 MG CAPS capsule; Take 1 capsule (0.4 mg total) by mouth daily after supper.  Dispense: 90 capsule; Refill: 3 - finasteride (PROSCAR) 5 MG tablet; Take 1 tablet (5 mg total) by mouth every morning.  Dispense: 90 tablet; Refill: 3   No follow-ups on file.  Wilkie Aye, MD  [facility] [place]

## 2022-10-23 ENCOUNTER — Emergency Department (HOSPITAL_COMMUNITY)
Admission: EM | Admit: 2022-10-23 | Discharge: 2022-10-23 | Disposition: A | Discharge status: Home / Self Care | Payer: Medicare Other | Attending: Student | Admitting: Student

## 2022-10-23 ENCOUNTER — Emergency Department (HOSPITAL_COMMUNITY): Payer: Medicare Other

## 2022-10-23 ENCOUNTER — Other Ambulatory Visit: Payer: Self-pay

## 2022-10-23 ENCOUNTER — Encounter (HOSPITAL_COMMUNITY): Payer: Self-pay | Admitting: Emergency Medicine

## 2022-10-23 DIAGNOSIS — Z79899 Other long term (current) drug therapy: Secondary | ICD-10-CM | POA: Diagnosis not present

## 2022-10-23 DIAGNOSIS — I2510 Atherosclerotic heart disease of native coronary artery without angina pectoris: Secondary | ICD-10-CM | POA: Insufficient documentation

## 2022-10-23 DIAGNOSIS — E119 Type 2 diabetes mellitus without complications: Secondary | ICD-10-CM | POA: Insufficient documentation

## 2022-10-23 DIAGNOSIS — L98491 Non-pressure chronic ulcer of skin of other sites limited to breakdown of skin: Secondary | ICD-10-CM | POA: Diagnosis not present

## 2022-10-23 DIAGNOSIS — R58 Hemorrhage, not elsewhere classified: Secondary | ICD-10-CM | POA: Diagnosis present

## 2022-10-23 DIAGNOSIS — Z794 Long term (current) use of insulin: Secondary | ICD-10-CM | POA: Insufficient documentation

## 2022-10-23 DIAGNOSIS — I4891 Unspecified atrial fibrillation: Secondary | ICD-10-CM | POA: Insufficient documentation

## 2022-10-23 DIAGNOSIS — K429 Umbilical hernia without obstruction or gangrene: Secondary | ICD-10-CM | POA: Insufficient documentation

## 2022-10-23 DIAGNOSIS — K8020 Calculus of gallbladder without cholecystitis without obstruction: Secondary | ICD-10-CM | POA: Diagnosis not present

## 2022-10-23 DIAGNOSIS — I10 Essential (primary) hypertension: Secondary | ICD-10-CM | POA: Diagnosis not present

## 2022-10-23 DIAGNOSIS — K4090 Unilateral inguinal hernia, without obstruction or gangrene, not specified as recurrent: Secondary | ICD-10-CM | POA: Diagnosis not present

## 2022-10-23 DIAGNOSIS — Z7901 Long term (current) use of anticoagulants: Secondary | ICD-10-CM | POA: Diagnosis not present

## 2022-10-23 LAB — COMPREHENSIVE METABOLIC PANEL
ALT: 33 U/L (ref 0–44)
AST: 30 U/L (ref 15–41)
Albumin: 3.9 g/dL (ref 3.5–5.0)
Alkaline Phosphatase: 82 U/L (ref 38–126)
Anion gap: 10 (ref 5–15)
BUN: 22 mg/dL (ref 8–23)
CO2: 26 mmol/L (ref 22–32)
Calcium: 9.5 mg/dL (ref 8.9–10.3)
Chloride: 102 mmol/L (ref 98–111)
Creatinine, Ser: 0.86 mg/dL (ref 0.61–1.24)
GFR, Estimated: >60 mL/min (ref >60)
Glucose, Bld: 89 mg/dL (ref 70–99)
Potassium: 4.2 mmol/L (ref 3.5–5.1)
Sodium: 138 mmol/L (ref 135–145)
Total Bilirubin: 1.2 mg/dL (ref 0.3–1.2)
Total Protein: 6.8 g/dL (ref 6.5–8.1)

## 2022-10-23 LAB — URINALYSIS, ROUTINE W REFLEX MICROSCOPIC
Bilirubin Urine: NEGATIVE
Glucose, UA: NEGATIVE mg/dL
Hgb urine dipstick: NEGATIVE
Ketones, ur: NEGATIVE mg/dL
Leukocytes,Ua: NEGATIVE
Nitrite: NEGATIVE
Protein, ur: NEGATIVE mg/dL
Specific Gravity, Urine: 1.004 — ABNORMAL LOW (ref 1.005–1.030)
pH: 7 (ref 5.0–8.0)

## 2022-10-23 LAB — CBC WITH DIFFERENTIAL/PLATELET
Abs Immature Granulocytes: 0.03 10*3/uL (ref 0.00–0.07)
Basophils Absolute: 0.1 10*3/uL (ref 0.0–0.1)
Basophils Relative: 1 %
Eosinophils Absolute: 0.3 10*3/uL (ref 0.0–0.5)
Eosinophils Relative: 2 %
HCT: 42.5 % (ref 39.0–52.0)
Hemoglobin: 13.8 g/dL (ref 13.0–17.0)
Immature Granulocytes: 0 %
Lymphocytes Relative: 59 %
Lymphs Abs: 6.6 10*3/uL — ABNORMAL HIGH (ref 0.7–4.0)
MCH: 29.2 pg (ref 26.0–34.0)
MCHC: 32.5 g/dL (ref 30.0–36.0)
MCV: 90 fL (ref 80.0–100.0)
Monocytes Absolute: 1 10*3/uL (ref 0.1–1.0)
Monocytes Relative: 9 %
Neutro Abs: 3.2 10*3/uL (ref 1.7–7.7)
Neutrophils Relative %: 29 %
Platelets: 164 10*3/uL (ref 150–400)
RBC: 4.72 MIL/uL (ref 4.22–5.81)
RDW: 14.9 % (ref 11.5–15.5)
WBC Morphology: ABNORMAL
WBC: 10.9 10*3/uL — ABNORMAL HIGH (ref 4.0–10.5)
nRBC: 0 % (ref 0.0–0.2)

## 2022-10-23 LAB — PROTIME-INR
INR: 2.1 — ABNORMAL HIGH (ref 0.8–1.2)
Prothrombin Time: 23.8 seconds — ABNORMAL HIGH (ref 11.4–15.2)

## 2022-10-23 MED ORDER — TRIPLE ANTIBIOTIC 3.5-400-5000 EX OINT
TOPICAL_OINTMENT | Freq: Once | CUTANEOUS | Status: AC
  Administered 2022-10-23: 1 via CUTANEOUS
  Filled 2022-10-23: qty 1

## 2022-10-23 MED ORDER — IOHEXOL 300 MG/ML  SOLN
100.0000 mL | Freq: Once | INTRAMUSCULAR | Status: AC | PRN
  Administered 2022-10-23: 100 mL via INTRAVENOUS

## 2022-10-23 NOTE — ED Provider Notes (Signed)
[place] EMERGENCY DEPARTMENT AT [facility] Provider Note   CSN: [id number] Arrival date & time: 10/23/22  1728     History  Chief Complaint  Patient presents with   Abnormal bleeding   Umbilical Hernia    [name] [name] is a 88 y.o. male with a history including CAD, type 2 diabetes history of CLL, atrial fibrillation on Coumadin presenting for evaluation of bleeding from his umbilicus.  He describes a longstanding history of umbilical hernia which has been present for years, but has gotten larger in recent weeks.  He denies pain at the site, but has noted increasingly prominent blood vessels at the site and tonight he had significant bleeding, describing a "fountain" of blood coming from the site, he applied pressure and it has since stopped bleeding.  Of note, he has reached out to Dr. Lovell Sheehan general surgery prior to today has appointment mid August for evaluation of this hernia.   The history is provided by the patient and the spouse.       Home Medications Prior to Admission medications   Medication Sig Start Date End Date Taking? Authorizing Provider  ACCU-CHEK SMARTVIEW test strip USE 1 STRIP TO CHECK GLUCOSE THREE TIMES DAILY FOR FLUCTUATING BLOOD SUGAR 01/09/21   [provider]  amLODipine (NORVASC) 10 MG tablet Take 10 mg by mouth daily. 01/24/19   [provider]  Ascorbic Acid (VITAMIN C) 1000 MG tablet Take 1,000 mg by mouth daily with breakfast.    [provider]  atorvastatin (LIPITOR) 10 MG tablet Take 10 mg by mouth every evening.    [provider]  Calcium Carb-Cholecalciferol 600-800 MG-UNIT TABS Take 2 tablets by mouth daily with breakfast.    [provider]  cholecalciferol (VITAMIN D) 1000 units tablet Take 1,000 Units by mouth every morning.    [provider]  ciclopirox (LOPROX) 0.77 % cream Apply 1 application topically every morning. APPLY TO THE FEET    [provider]  Coenzyme  Q10 (CO Q-10) 100 MG CAPS Take 100 mg by mouth daily with breakfast.     [provider]  digoxin (LANOXIN) 0.125 MG tablet Take 0.125 mg by mouth daily with breakfast.     [provider]  finasteride (PROSCAR) 5 MG tablet Take 1 tablet (5 mg total) by mouth every morning. 10/21/22   McKenzie, Mardene Celeste, MD  furosemide (LASIX) 40 MG tablet Take 40 mg by mouth daily. 01/03/21   [provider]  HUMALOG KWIKPEN 100 UNIT/ML KwikPen Inject 1-9 Units into the skin 3 (three) times daily. CBG 121 - 150: 1 unit,  CBG 151 - 200: 2 units,  CBG 201 - 250: 3 units,  CBG 251 - 300: 5 units,  CBG 301 - 350: 7 units,  CBG 351 - 400: 9 units 03/28/19   [provider]  Insulin Detemir (LEVEMIR FLEXTOUCH) 100 UNIT/ML Pen Inject 20 Units into the skin daily. Patient taking differently: Inject 60 Units into the skin daily. 02/21/18   Mikhail, Nita Sells, DO  lisinopril (ZESTRIL) 40 MG tablet Take 1 tablet every day by oral route.    [provider]  LORazepam (ATIVAN) 1 MG tablet Take 1 mg by mouth 2 (two) times daily as needed. 05/27/19   [provider]  Multiple Vitamin (MULTIVITAMIN WITH MINERALS) TABS tablet Take 1 tablet by mouth daily with breakfast.    [provider]  Omega-3 Fatty Acids (FISH OIL) 1200 MG CAPS Take 2 capsules by  mouth 2 (two) times daily.    [provider]  polyethylene glycol (MIRALAX / GLYCOLAX) packet Take 17 g by mouth daily.    [provider]  potassium chloride SA (K-DUR,KLOR-CON) 20 MEQ tablet Take 40 mEq by mouth daily. Take starting 01/07/2018    [provider]  tamsulosin (FLOMAX) 0.4 MG CAPS capsule Take 1 capsule (0.4 mg total) by mouth daily after supper. 10/21/22   McKenzie, Mardene Celeste, MD  VICTOZA 18 MG/3ML SOPN Inject 1.8 mg into the skin daily with breakfast. 06/05/16   [provider]  vitamin E 400 UNIT capsule Take 400 Units by mouth every morning.    [provider]   warfarin (COUMADIN) 5 MG tablet TAKE 1 & 1/2 (ONE & ONE-HALF) TABLETS BY MOUTH ONCE DAILY EXCEPT 1 TABLET ON MONDAYS, WEDNESDAYS AND FRIDAYS OR  AS  DIRECTED  BY  ANTICOAGULATION  CLINIC 03/11/22   Jonelle Sidle, MD      Allergies    Erythromycin and Penicillins    Review of Systems   Review of Systems  Constitutional:  Negative for fever.  HENT:  Negative for congestion and sore throat.   Eyes: Negative.   Respiratory:  Negative for chest tightness and shortness of breath.   Cardiovascular:  Negative for chest pain.  Gastrointestinal:  Positive for abdominal distention. Negative for abdominal pain, nausea and vomiting.  Genitourinary: Negative.   Musculoskeletal:  Negative for arthralgias, joint swelling and neck pain.  Skin:  Positive for wound. Negative for rash.  Neurological:  Negative for dizziness, weakness, light-headedness, numbness and headaches.  Psychiatric/Behavioral: Negative.    All other systems reviewed and are negative.   Physical Exam Updated Vital Signs BP (!) 153/63   Pulse (!) 58   Temp 98.5 F (36.9 C) (Oral)   Resp 17   Ht 6' (1.829 m)   Wt 122.5 kg   SpO2 94%   BMI 36.62 kg/m  Physical Exam Vitals and nursing note reviewed.  Constitutional:      Appearance: He is well-developed.  HENT:     Head: Normocephalic and atraumatic.  Eyes:     Conjunctiva/sclera: Conjunctivae normal.  Cardiovascular:     Rate and Rhythm: Normal rate and regular rhythm.     Heart sounds: Normal heart sounds.  Pulmonary:     Effort: Pulmonary effort is normal.     Breath sounds: Normal breath sounds. No wheezing.  Abdominal:     General: Bowel sounds are normal.     Palpations: Abdomen is soft.     Tenderness: There is no abdominal tenderness. There is no guarding.     Hernia: A hernia is present.     Comments: Large soft umbilical hernia, nontender.  There is a 1 cm shallow ulceration with dried blood.  There are multiple superficial blood vessels easily  visualized, no active bleeding.  Bilateral abdominal wall skin changes consistent with chronic insulin injections.  Musculoskeletal:        General: Normal range of motion.     Cervical back: Normal range of motion.  Skin:    General: Skin is warm and dry.  Neurological:     Mental Status: He is alert.     ED Results / Procedures / Treatments   Labs (all labs ordered are listed, but only abnormal results are displayed) Labs Reviewed  PROTIME-INR - Abnormal; Notable for the following components:      Result Value   Prothrombin Time 23.8 (*)  INR 2.1 (*)    All other components within normal limits  CBC WITH DIFFERENTIAL/PLATELET - Abnormal; Notable for the following components:   WBC 10.9 (*)    Lymphs Abs 6.6 (*)    All other components within normal limits  URINALYSIS, ROUTINE W REFLEX MICROSCOPIC - Abnormal; Notable for the following components:   Specific Gravity, Urine 1.004 (*)    All other components within normal limits  COMPREHENSIVE METABOLIC PANEL    EKG None  Radiology CT ABDOMEN PELVIS W CONTRAST  Result Date: 10/23/2022 CLINICAL DATA:  Hernia suspected, abdominal wall EXAM: CT ABDOMEN AND PELVIS WITH CONTRAST TECHNIQUE: Multidetector CT imaging of the abdomen and pelvis was performed using the standard protocol following bolus administration of intravenous contrast. RADIATION DOSE REDUCTION: This exam was performed according to the departmental dose-optimization program which includes automated exposure control, adjustment of the mA and/or kV according to patient size and/or use of iterative reconstruction technique. CONTRAST:  OMNIPAQUE IOHEXOL 300 MG/ML  SOLN COMPARISON:  None Available. FINDINGS: Lower chest: Mitral annular and coronary artery calcification. Hepatobiliary: No focal liver abnormality. Calcified gallstone noted within the gallbladder lumen. No gallbladder wall thickening or pericholecystic fluid. No biliary dilatation. Pancreas: Diffusely  atrophic. No focal lesion. Otherwise normal pancreatic contour. No surrounding inflammatory changes. No main pancreatic ductal dilatation. Spleen: Normal in size without focal abnormality. Adrenals/Urinary Tract: No adrenal nodule bilaterally. Bilateral kidneys enhance symmetrically. Fluid density lesion of the kidneys likely represent simple renal cysts. Simple renal cysts, in the absence of clinically indicated signs/symptoms, require no independent follow-up. No hydronephrosis. No hydroureter. The urinary bladder is unremarkable. On delayed imaging, there is no urothelial wall thickening and there are no filling defects in the opacified portions of the bilateral collecting systems or ureters. Stomach/Bowel: Stomach is within normal limits. No evidence of bowel wall thickening or dilatation. Appendix appears normal. Vascular/Lymphatic: No abdominal aorta or iliac aneurysm. Severe atherosclerotic plaque of the aorta and its branches. No abdominal, pelvic, or inguinal lymphadenopathy. Reproductive: Prostate is unremarkable.  The sec knee clips noted. Other: No intraperitoneal free fluid. No intraperitoneal free gas. No organized fluid collection. Musculoskeletal: Moderate to large volume umbilical hernia with an abdominal defect of 4.4 x 2.8 cm. Hernia contains a long loop of small bowel. Subcutaneus soft tissue edema and dermal thickening of the pannus laterally to the hernia sac. Small fat containing left inguinal hernia. No suspicious lytic or blastic osseous lesions. No acute displaced fracture. Multilevel severe degenerative changes of the spine. IMPRESSION: 1. Moderate to large volume umbilical hernia containing a long loop of small bowel. No findings suggest associated ischemia, bowel obstruction, incarceration. 2. Subcutaneus soft tissue edema and dermal thickening of the pannus laterally to the hernia sac. Finding likely related to medication injection. Correlate with physical exam for infection-less likely  given dermis along midline does not appear thickened. 3. Cholelithiasis with no CT finding of acute cholecystitis. 4. Small fat containing left inguinal hernia. 5.  Aortic Atherosclerosis (ICD10-I70.0). Electronically Signed   By: Tish Frederickson M.D.   On: 10/23/2022 20:53    Procedures Procedures    Medications Ordered in ED Medications  neomycin-bacitracin-polymyxin [phone] OINT (has no administration in time range)  iohexol (OMNIPAQUE) 300 MG/ML solution 100 mL (100 mLs Intravenous Contrast Given 10/23/22 2020)    ED Course/ Medical Decision Making/ A&P                             Medical  Decision Making Patient presenting with a chronic umbilical hernia, it is soft and nontender, complaint today of bleeding from the site, which is currently resolved.  He has a small ulceration of his skin which appears may have eroded a small vessel which has resolved with gentle pressure.  Labs and CT imaging per below.  There is no incarceration of this hernia, he will need follow-up care with general surgery, no indication for admission today, he was advised to reach out to Dr. Lovell Sheehan for closer office visit if possible.  Precautions outlined.  Amount and/or Complexity of Data Reviewed Labs: ordered. Radiology: ordered.           Final Clinical Impression(s) / ED Diagnoses Final diagnoses:  Umbilical hernia without obstruction and without gangrene  Skin ulcer, limited to breakdown of skin [facility])    Rx / DC Orders ED Discharge Orders     None         Victoriano Lain 10/23/22 2135    Glendora Score, MD 10/24/22 1253

## 2022-10-23 NOTE — Discharge Instructions (Addendum)
Your labs and CT scan are reassuring today.  I recommend local skin care at the site including an antibiotic ointment of choice such as Neosporin applied twice daily, keep the site covered to avoid irritation and friction from your clothing.

## 2022-10-23 NOTE — ED Triage Notes (Signed)
Pt via POV c/o bleeding from the navel r/t large umbilical hernia. Bleeding started today; pt denies pain. Pt takes warfarin with most recent coags drawn July 9. Bleeding currently controlled in triage with gauze in place over pt's navel.

## 2022-10-26 DIAGNOSIS — Z961 Presence of intraocular lens: Secondary | ICD-10-CM | POA: Diagnosis not present

## 2022-10-26 DIAGNOSIS — E109 Type 1 diabetes mellitus without complications: Secondary | ICD-10-CM | POA: Diagnosis not present

## 2022-10-26 DIAGNOSIS — H02201 Unspecified lagophthalmos right upper eyelid: Secondary | ICD-10-CM | POA: Diagnosis not present

## 2022-10-27 ENCOUNTER — Encounter: Payer: Self-pay | Admitting: Urology

## 2022-10-27 NOTE — Patient Instructions (Signed)

## 2022-10-29 ENCOUNTER — Ambulatory Visit (INDEPENDENT_AMBULATORY_CARE_PROVIDER_SITE_OTHER): Payer: Medicare Other | Admitting: General Surgery

## 2022-10-29 ENCOUNTER — Encounter: Payer: Self-pay | Admitting: General Surgery

## 2022-10-29 VITALS — BP 153/72 | HR 56 | Temp 98.2°F | Resp 14 | Ht 72.0 in | Wt 257.0 lb

## 2022-10-29 DIAGNOSIS — K429 Umbilical hernia without obstruction or gangrene: Secondary | ICD-10-CM

## 2022-10-29 NOTE — Progress Notes (Signed)
[name] [name]; [id number]; [birth year]   HPI Patient is an 88 year old white male who was referred to my care by the emergency room and Dr. Sherryll Burger for evaluation and treatment of an umbilical hernia.  He was recently seen in the emergency room for skin irritation and bleeding of the skin from the umbilical hernia.  He is on Coumadin for atrial fibrillation.  He states he has had the hernia for some time but has only recently become irritated.  He states that the inflammation on either side of the hernia is from medication injections.  He denies any nausea or vomiting.  He states the bleeding has somewhat decreased.  The hernia does stay stuck out. Past Medical History:  Diagnosis Date   Atrial fibrillation (HCC)    Chronic lymphocytic leukemia (HCC)    Coronary atherosclerosis of native coronary artery    Nonobstructive   Essential hypertension    Hyperlipidemia    IgA nephropathy    Type 2 diabetes mellitus (HCC)    UTI (urinary tract infection)     Past Surgical History:  Procedure Laterality Date   AMPUTATION Left 09/15/2012   Procedure: PARTIAL AMPUTATION 3rd TOE LEFT FOOT;  Surgeon: Dallas Schimke, DPM;  Location: [facility];  Service: Orthopedics;  Laterality: Left;   AMPUTATION Left 04/20/2013   Procedure: PARTIAL AMPUTATION 2ND TOE LEFT FOOT;  Surgeon: Dallas Schimke, DPM;  Location: [facility];  Service: Orthopedics;  Laterality: Left;   ANAL FISSURE REPAIR     APPLICATION OF WOUND VAC Right 09/09/2016   Procedure: APPLICATION OF WOUND VAC;  Surgeon: Ferman Hamming, DPM;  Location: [facility];  Service: Podiatry;  Laterality: Right;   CATARACT EXTRACTION W/PHACO Right 09/30/2015   Procedure: CATARACT EXTRACTION PHACO AND INTRAOCULAR LENS PLACEMENT (IOC);  Surgeon: Gemma Payor, MD;  Location: [facility];  Service: Ophthalmology;  Laterality: Right;  CDE: 11.78   CATARACT EXTRACTION W/PHACO Left 10/31/2015   Procedure: CATARACT EXTRACTION PHACO AND INTRAOCULAR LENS PLACEMENT LEFT EYE;  CDE:  9.10;  Surgeon: Gemma Payor, MD;  Location: [facility];  Service: Ophthalmology;  Laterality: Left;   COLONOSCOPY  08/19/2011   Procedure: COLONOSCOPY;  Surgeon: Malissa Hippo, MD;  [facility];  Service: Endoscopy;  Laterality: N/A;  930   COLONOSCOPY N/A 09/19/2014   Procedure: COLONOSCOPY;  Surgeon: Malissa Hippo, MD;  [facility];  Service: Endoscopy;  Laterality: N/A;  1055   FEMORAL HERNIA REPAIR     INCISION AND DRAINAGE Right 09/09/2016   Procedure: DEBRIDEMENT WOUND RT heel with wound vac attachment;  Surgeon: Ferman Hamming, DPM;  Location: [facility];  Service: Podiatry;  Laterality: Right;   INCISION AND DRAINAGE ABSCESS Right 02/16/2018   Procedure: INCISION AND DRAINAGE ABSCESS;  Surgeon: Newman Pies, MD;  Location: [facility];  Service: ENT;  Laterality: Right;   INCISION AND DRAINAGE OF WOUND Right 08/12/2016   Procedure: DEBRIDEMENT WOUND RIGHT HEEL;  Surgeon: Ferman Hamming, DPM;  Location: [facility];  Service: Podiatry;  Laterality: Right;  right heel   MASS EXCISION Right 09/28/2017   Procedure: EXCISION OF EXTERNAL AUDITORY CANAL MASS;  Surgeon: Newman Pies, MD;  Location: [facility];  Service: ENT;  Laterality: Right;   MASTOIDECTOMY Right 02/16/2018   Procedure: REVISION MASTOIDECTOMY;  Surgeon: Newman Pies, MD;  Location: [facility];  Service: ENT;  Laterality: Right;   Open repair left quadriceps tendon.  08/22/07   Dr. Romeo Apple   Right total knee replacement  TRANSURETHRAL RESECTION OF PROSTATE     TYMPANOMASTOIDECTOMY Right 11/10/2017   Procedure: RIGHT TYMPANOMASTOIDECTOMY;  Surgeon: Newman Pies, MD;  Location: [facility];  Service: ENT;  Laterality: Right;   WOUND EXPLORATION Right 08/12/2016   Procedure: EXPLORATION OF WOUND FOR FOREIGN BODY RIGHT HEEL;  Surgeon: Ferman Hamming, DPM;  Location: [facility];  Service: Podiatry;  Laterality: Right;  right heel    Family History  Problem Relation Age of Onset   Atrial fibrillation  Mother     Current Outpatient Medications on File Prior to Visit  Medication Sig Dispense Refill   ACCU-CHEK SMARTVIEW test strip USE 1 STRIP TO CHECK GLUCOSE THREE TIMES DAILY FOR FLUCTUATING BLOOD SUGAR     amLODipine (NORVASC) 10 MG tablet Take 10 mg by mouth daily.     Ascorbic Acid (VITAMIN C) 1000 MG tablet Take 1,000 mg by mouth daily with breakfast.     atorvastatin (LIPITOR) 10 MG tablet Take 10 mg by mouth every evening.     Calcium Carb-Cholecalciferol 600-800 MG-UNIT TABS Take 2 tablets by mouth daily with breakfast.     cholecalciferol (VITAMIN D) 1000 units tablet Take 1,000 Units by mouth every morning.     ciclopirox (LOPROX) 0.77 % cream Apply 1 application topically every morning. APPLY TO THE FEET     Coenzyme Q10 (CO Q-10) 100 MG CAPS Take 100 mg by mouth daily with breakfast.      digoxin (LANOXIN) 0.125 MG tablet Take 0.125 mg by mouth daily with breakfast.      finasteride (PROSCAR) 5 MG tablet Take 1 tablet (5 mg total) by mouth every morning. 90 tablet 3   furosemide (LASIX) 40 MG tablet Take 40 mg by mouth daily.     HUMALOG KWIKPEN 100 UNIT/ML KwikPen Inject 1-9 Units into the skin 3 (three) times daily. CBG 121 - 150: 1 unit,  CBG 151 - 200: 2 units,  CBG 201 - 250: 3 units,  CBG 251 - 300: 5 units,  CBG 301 - 350: 7 units,  CBG 351 - 400: 9 units     Insulin Detemir (LEVEMIR FLEXTOUCH) 100 UNIT/ML Pen Inject 20 Units into the skin daily. (Patient taking differently: Inject 60 Units into the skin daily.)     lisinopril (ZESTRIL) 40 MG tablet Take 1 tablet every day by oral route.     LORazepam (ATIVAN) 1 MG tablet Take 1 mg by mouth 2 (two) times daily as needed.     Multiple Vitamin (MULTIVITAMIN WITH MINERALS) TABS tablet Take 1 tablet by mouth daily with breakfast.     Omega-3 Fatty Acids (FISH OIL) 1200 MG CAPS Take 2 capsules by mouth 2 (two) times daily.     polyethylene glycol (MIRALAX / GLYCOLAX) packet Take 17 g by mouth daily.     potassium chloride SA  (K-DUR,KLOR-CON) 20 MEQ tablet Take 40 mEq by mouth daily. Take starting 01/07/2018     tamsulosin (FLOMAX) 0.4 MG CAPS capsule Take 1 capsule (0.4 mg total) by mouth daily after supper. 90 capsule 3   VICTOZA 18 MG/3ML SOPN Inject 1.8 mg into the skin daily with breakfast.     vitamin E 400 UNIT capsule Take 400 Units by mouth every morning.     warfarin (COUMADIN) 5 MG tablet TAKE 1 & 1/2 (ONE & ONE-HALF) TABLETS BY MOUTH ONCE DAILY EXCEPT 1 TABLET ON MONDAYS, WEDNESDAYS AND FRIDAYS OR  AS  DIRECTED  BY  ANTICOAGULATION  CLINIC 135 tablet 3   No  current facility-administered medications on file prior to visit.    Allergies  Allergen Reactions   Erythromycin Other (See Comments)   Penicillins Hives and Other (See Comments)    Has patient had a PCN reaction causing immediate rash, facial/tongue/throat swelling, SOB or lightheadedness with hypotension: No Has patient had a PCN reaction causing severe rash involving mucus membranes or skin necrosis: No Has patient had a PCN reaction that required hospitalization No Has patient had a PCN reaction occurring within the last 10 years: No If all of the above answers are "NO", then may proceed with Cephalosporin use.  Has tolerated Rocephin    Social History   Substance and Sexual Activity  Alcohol Use No   Alcohol/week: 0.0 standard drinks of alcohol    Social History   Tobacco Use  Smoking Status Former   Current packs/day: 0.00   Types: Cigarettes   Start date: 04/06/1953   Quit date: 04/07/1963   Years since quitting: 59.6  Smokeless Tobacco Never    Review of Systems  Constitutional:  Positive for malaise/fatigue.  HENT:  Positive for sinus pain.   Eyes:  Positive for blurred vision.  Respiratory: Negative.    Cardiovascular: Negative.   Gastrointestinal: Negative.   Genitourinary:  Positive for frequency.  Musculoskeletal: Negative.   Neurological: Negative.   Endo/Heme/Allergies:  Bruises/bleeds easily.   Psychiatric/Behavioral: Negative.      Objective   Vitals:   10/29/22 0849  BP: (!) 153/72  Pulse: (!) 56  Resp: 14  Temp: 98.2 F (36.8 C)  SpO2: 96%    Physical Exam Vitals reviewed.  Constitutional:      Appearance: Normal appearance. He is obese. He is not ill-appearing.  HENT:     Head: Normocephalic and atraumatic.  Cardiovascular:     Rate and Rhythm: Normal rate. Rhythm irregular.     Heart sounds: Normal heart sounds. No murmur heard.    No friction rub. No gallop.  Pulmonary:     Effort: Pulmonary effort is normal. No respiratory distress.     Breath sounds: Normal breath sounds. No stridor. No wheezing or rhonchi.  Abdominal:     General: Bowel sounds are normal. There is no distension.     Palpations: Abdomen is soft. There is no mass.     Tenderness: There is no abdominal tenderness. There is no guarding or rebound.     Hernia: A hernia is present.     Comments: Patient has a large umbilical hernia with very thin skin overlying the sac.  I can see peristalsis of the bowel through the skin.  No active bleeding is noted.  The skin is erythematous but I think this is secondary to its thinness.  Skin:    General: Skin is warm and dry.  Neurological:     Mental Status: He is alert and oriented to person, place, and time.     Media Information  Document Information  Photos    10/29/2022 09:08  Attached To:  Office Visit on 10/29/22 with Franky Macho, MD  Source Information  Franky Macho, MD  [facility]  Document History    CT scan images personally reviewed Assessment  Umbilical hernia with significant loss of domain, multiple comorbidities including Coumadin usage for atrial fibrillation Plan  I told the patient that repair of this hernia was outside my realm of expertise.  I have referred him to Dr. Barnetta Chapel at [facility] for further evaluation and treatment.  He will be seeing  the patient later this month.   Follow-up here as needed.

## 2022-10-30 ENCOUNTER — Telehealth: Payer: Self-pay

## 2022-10-30 NOTE — Telephone Encounter (Signed)
Transition Care Management Unsuccessful Follow-up Telephone Call  Date of discharge and from where:  [name] [name] 7/19  Attempts:  1st Attempt  Reason for unsuccessful TCM follow-up call:  No answer/busy   [name] [name] [facility] Guide, [facility] [phone] [address] [place], [place], [place] [address] Phone: [phone] Email: [email or web address]

## 2022-10-30 NOTE — Telephone Encounter (Signed)
Transition Care Management Unsuccessful Follow-up Telephone Call  Date of discharge and from where:   [facility]  Attempts:  2nd  Reason for unsuccessful TCM follow-up call:  No answer/busy   [facility] Guide, [facility] [phone] [address] [place], [place], [place] [address] Phone: [phone] Email: [email or web address]

## 2022-11-10 ENCOUNTER — Ambulatory Visit: Payer: Medicare Other | Attending: Cardiology | Admitting: *Deleted

## 2022-11-10 DIAGNOSIS — F419 Anxiety disorder, unspecified: Secondary | ICD-10-CM | POA: Diagnosis not present

## 2022-11-10 DIAGNOSIS — I4821 Permanent atrial fibrillation: Secondary | ICD-10-CM | POA: Insufficient documentation

## 2022-11-10 DIAGNOSIS — Z5181 Encounter for therapeutic drug level monitoring: Secondary | ICD-10-CM | POA: Insufficient documentation

## 2022-11-10 DIAGNOSIS — I10 Essential (primary) hypertension: Secondary | ICD-10-CM | POA: Diagnosis not present

## 2022-11-10 DIAGNOSIS — Z299 Encounter for prophylactic measures, unspecified: Secondary | ICD-10-CM | POA: Diagnosis not present

## 2022-11-10 LAB — POCT INR: INR: 2.4 (ref 2.0–3.0)

## 2022-11-10 NOTE — Patient Instructions (Signed)
Continue warfarin 1 tablet daily except 1 1/2 tablets on Sundays and Thursdays Recheck in 4 wk

## 2022-11-19 ENCOUNTER — Ambulatory Visit: Payer: Medicare Other | Admitting: General Surgery

## 2022-11-26 DIAGNOSIS — K429 Umbilical hernia without obstruction or gangrene: Secondary | ICD-10-CM | POA: Diagnosis not present

## 2022-12-01 ENCOUNTER — Other Ambulatory Visit: Payer: Self-pay | Admitting: Surgery

## 2022-12-01 DIAGNOSIS — K420 Umbilical hernia with obstruction, without gangrene: Secondary | ICD-10-CM | POA: Diagnosis not present

## 2022-12-02 ENCOUNTER — Telehealth: Payer: Self-pay | Admitting: Cardiology

## 2022-12-02 NOTE — Telephone Encounter (Signed)
Please advise holding Coumadin prior to hernia surgery.  Thank you!  DW

## 2022-12-02 NOTE — Telephone Encounter (Signed)
   Pre-operative Risk Assessment    Patient Name: [name] [name]  DOB: [birth year] MRN: [id number]{     Request for Surgical Clearance{ 1. What type of surgery is being performed? Enter name of procedure below and number of teeth if dental extraction.    Procedure:   hernia surger  2. When is this surgery scheduled? Press F2 to enter date below and place date in Reason for Visit (see directions below).  Date of Surgery:  Clearance TBD                         3. What is the name of the Surgeon, the Surgeon's Group or Practice, phone and fax number?  Press F2 and list below  Surgeon:  [name] Surgeon's Group or Practice Name:  [facility] Phone number:  [phone]  Fax number:  [phone] Scarlett Presto CMA   Type of Clearance Requested:   - Pharmacy:  Hold Warfarin (Coumadin) .   Type of Anesthesia:  General    Additional requests/questions:  Please advise surgeon/provider what medications should be held.  Signed, Vallarie Mare   12/02/2022, 3:30 PM

## 2022-12-04 ENCOUNTER — Telehealth: Payer: Self-pay | Admitting: *Deleted

## 2022-12-04 NOTE — Telephone Encounter (Signed)
Patient with diagnosis of afib on warfarin for anticoagulation.    Procedure: hernia surgery Date of procedure: TBD   CHA2DS2-VASc Score = 6   This indicates a 9.7% annual risk of stroke. The patient's score is based upon: CHF History: 1 HTN History: 1 Diabetes History: 1 Stroke History: 0 Vascular Disease History: 1 Age Score: 2 Gender Score: 0      Per office protocol, patient can hold warfarin for 5 days prior to procedure.   Patient will NOT need bridging with Lovenox (enoxaparin) around procedure.  **This guidance is not considered finalized until pre-operative APP has relayed final recommendations.**

## 2022-12-04 NOTE — Telephone Encounter (Signed)
   Name: [name] [name]  DOB: 08-22-1934  MRN: [id number]  Primary Cardiologist: Nona Dell, MD   Preoperative team, please contact this patient and set up a phone call appointment for further preoperative risk assessment. Please obtain consent and complete medication review. Thank you for your help.  I confirm that guidance regarding antiplatelet and oral anticoagulation therapy has been completed and, if necessary, noted below.   Per office protocol, patient can hold warfarin for 5 days prior to procedure.   Patient will NOT need bridging with Lovenox (enoxaparin) around procedure. Please resume warfarin as soon as possible postprocedure, at the discretion of the surgeon.     Joylene Grapes, NP 12/04/2022, 11:20 AM [facility]

## 2022-12-04 NOTE — Telephone Encounter (Signed)
S/w DPR pt's wife who states the pt is HOA. Tele pre op appt has been made for 12/15/22 @ 9:40. Med rec and consent are done.

## 2022-12-04 NOTE — Telephone Encounter (Signed)
S/w DPR pt's wife who states the pt is HOA. Tele pre op appt has been made for 12/15/22 @ 9:40. Med rec and consent are done.     Patient Consent for Virtual Visit        [name] [name] has provided verbal consent on 12/04/2022 for a virtual visit (video or telephone).   CONSENT FOR VIRTUAL VISIT FOR:  [name] [name]  By participating in this virtual visit I agree to the following:  I hereby voluntarily request, consent and authorize [facility] and its employed or contracted physicians, physician assistants, nurse practitioners or other licensed health care professionals (the Practitioner), to provide me with telemedicine health care services (the "Services") as deemed necessary by the treating Practitioner. I acknowledge and consent to receive the Services by the Practitioner via telemedicine. I understand that the telemedicine visit will involve communicating with the Practitioner through live audiovisual communication technology and the disclosure of certain medical information by electronic transmission. I acknowledge that I have been given the opportunity to request an in-person assessment or other available alternative prior to the telemedicine visit and am voluntarily participating in the telemedicine visit.  I understand that I have the right to withhold or withdraw my consent to the use of telemedicine in the course of my care at any time, without affecting my right to future care or treatment, and that the Practitioner or I may terminate the telemedicine visit at any time. I understand that I have the right to inspect all information obtained and/or recorded in the course of the telemedicine visit and may receive copies of available information for a reasonable fee.  I understand that some of the potential risks of receiving the Services via telemedicine include:  Delay or interruption in medical evaluation due to technological equipment failure or disruption; Information  transmitted may not be sufficient (e.g. poor resolution of images) to allow for appropriate medical decision making by the Practitioner; and/or  In rare instances, security protocols could fail, causing a breach of personal health information.  Furthermore, I acknowledge that it is my responsibility to provide information about my medical history, conditions and care that is complete and accurate to the best of my ability. I acknowledge that Practitioner's advice, recommendations, and/or decision may be based on factors not within their control, such as incomplete or inaccurate data provided by me or distortions of diagnostic images or specimens that may result from electronic transmissions. I understand that the practice of medicine is not an exact science and that Practitioner makes no warranties or guarantees regarding treatment outcomes. I acknowledge that a copy of this consent can be made available to me via my patient portal [facility] MyChart), or I can request a printed copy by calling the office of [facility].    I understand that my insurance will be billed for this visit.   I have read or had this consent read to me. I understand the contents of this consent, which adequately explains the benefits and risks of the Services being provided via telemedicine.  I have been provided ample opportunity to ask questions regarding this consent and the Services and have had my questions answered to my satisfaction. I give my informed consent for the services to be provided through the use of telemedicine in my medical care

## 2022-12-08 ENCOUNTER — Ambulatory Visit: Payer: Medicare Other | Attending: Cardiology | Admitting: *Deleted

## 2022-12-08 DIAGNOSIS — I4821 Permanent atrial fibrillation: Secondary | ICD-10-CM | POA: Diagnosis not present

## 2022-12-08 DIAGNOSIS — Z5181 Encounter for therapeutic drug level monitoring: Secondary | ICD-10-CM | POA: Insufficient documentation

## 2022-12-08 LAB — POCT INR: INR: 1.8 — AB (ref 2.0–3.0)

## 2022-12-08 NOTE — Patient Instructions (Signed)
Take warfarin 2 tablets tonight then resume 1 tablet daily except 1 1/2 tablets on Sundays and Thursdays Recheck in 3 wk Pending Hernia Repair.  Will hold warfarin 5 days before procedure.

## 2022-12-14 NOTE — Progress Notes (Unsigned)
Virtual Visit via Telephone Note   Because of [name] [name]'s co-morbid illnesses, he is at least at moderate risk for complications without adequate follow up.  This format is felt to be most appropriate for this patient at this time.  The patient did not have access to video technology/had technical difficulties with video requiring transitioning to audio format only (telephone).  All issues noted in this document were discussed and addressed.  No physical exam could be performed with this format.  Please refer to the patient's chart for his consent to telehealth for [facility].  Evaluation Performed:  Preoperative cardiovascular risk assessment _____________   Date:  12/14/2022   Patient ID:  [name] [name], DOB April 29, 1934, MRN [id number] Patient Location:  Home Provider location:   Office  Primary Care Provider:  Kirstie Peri, MD Primary Cardiologist:  [name] Dell, MD  Chief Complaint / Patient Profile   88 y.o. y/o male with a h/o permanent atrial fibrillation, degenerative calcified aortic stenosis which was mild by echo in November 2023, hypertension, and mixed hyperlipidemia.  He is pending hernia repair by Dr. Abigail [name], [facility] surgery on date to be determined and presents today for telephonic preoperative cardiovascular risk assessment.  History of Present Illness    [name] [name] is a 88 y.o. male who presents via audio/video conferencing for a telehealth visit today.  Pt was last seen in cardiology clinic on 09/15/2022 by [name] [name]..  At that time [name] [name] was doing well heart rate was well-controlled, CHA2DS2-VASc score 5 remaining on Coumadin, annual echoes for aortic stenosis are planned.  Blood pressure was well-controlled..  The patient is now pending procedure as outlined above. Since his last visit, he ***  Past Medical History    Past Medical History:  Diagnosis Date   Atrial fibrillation (HCC)    Chronic lymphocytic leukemia  (HCC)    Coronary atherosclerosis of native coronary artery    Nonobstructive   Essential hypertension    Hyperlipidemia    IgA nephropathy    Type 2 diabetes mellitus (HCC)    UTI (urinary tract infection)    Past Surgical History:  Procedure Laterality Date   AMPUTATION Left 09/15/2012   Procedure: PARTIAL AMPUTATION 3rd TOE LEFT FOOT;  Surgeon: [name] [name], DPM;  Location: [facility];  Service: Orthopedics;  Laterality: Left;   AMPUTATION Left 04/20/2013   Procedure: PARTIAL AMPUTATION 2ND TOE LEFT FOOT;  Surgeon: [name] [name], DPM;  Location: [facility];  Service: Orthopedics;  Laterality: Left;   ANAL FISSURE REPAIR     APPLICATION OF WOUND VAC Right 09/09/2016   Procedure: APPLICATION OF WOUND VAC;  Surgeon: [name] [name], DPM;  Location: [facility];  Service: Podiatry;  Laterality: Right;   CATARACT EXTRACTION W/PHACO Right 09/30/2015   Procedure: CATARACT EXTRACTION PHACO AND INTRAOCULAR LENS PLACEMENT (IOC);  Surgeon: [name] Payor, MD;  Location: [facility];  Service: Ophthalmology;  Laterality: Right;  CDE: 11.78   CATARACT EXTRACTION W/PHACO Left 10/31/2015   Procedure: CATARACT EXTRACTION PHACO AND INTRAOCULAR LENS PLACEMENT LEFT EYE; CDE:  9.10;  Surgeon: [name] Payor, MD;  Location: [facility];  Service: Ophthalmology;  Laterality: Left;   COLONOSCOPY  08/19/2011   Procedure: COLONOSCOPY;  Surgeon: [name] Hippo, MD;  [facility];  Service: Endoscopy;  Laterality: N/A;  930   COLONOSCOPY N/A 09/19/2014   Procedure: COLONOSCOPY;  Surgeon: [name] Hippo, MD;  [facility];  Service: Endoscopy;  Laterality: N/A;  1055   FEMORAL HERNIA REPAIR     INCISION AND DRAINAGE Right 09/09/2016   Procedure: DEBRIDEMENT WOUND RT heel with wound vac attachment;  Surgeon: [name] [name], DPM;  Location: [facility];  Service: Podiatry;  Laterality: Right;   INCISION AND DRAINAGE ABSCESS Right 02/16/2018   Procedure: INCISION AND DRAINAGE ABSCESS;  Surgeon: [name] Pies, MD;   Location: [facility];  Service: ENT;  Laterality: Right;   INCISION AND DRAINAGE OF WOUND Right 08/12/2016   Procedure: DEBRIDEMENT WOUND RIGHT HEEL;  Surgeon: [name] [name], DPM;  Location: [facility];  Service: Podiatry;  Laterality: Right;  right heel   MASS EXCISION Right 09/28/2017   Procedure: EXCISION OF EXTERNAL AUDITORY CANAL MASS;  Surgeon: [name] Pies, MD;  Location: [facility];  Service: ENT;  Laterality: Right;   MASTOIDECTOMY Right 02/16/2018   Procedure: REVISION MASTOIDECTOMY;  Surgeon: [name] Pies, MD;  Location: [facility];  Service: ENT;  Laterality: Right;   Open repair left quadriceps tendon.  08/22/07   [name] [name]   Right total knee replacement     TRANSURETHRAL RESECTION OF PROSTATE     TYMPANOMASTOIDECTOMY Right 11/10/2017   Procedure: RIGHT TYMPANOMASTOIDECTOMY;  Surgeon: [name] Pies, MD;  Location: [facility];  Service: ENT;  Laterality: Right;   WOUND EXPLORATION Right 08/12/2016   Procedure: EXPLORATION OF WOUND FOR FOREIGN BODY RIGHT HEEL;  Surgeon: [name] [name], DPM;  Location: [facility];  Service: Podiatry;  Laterality: Right;  right heel    Allergies  Allergies  Allergen Reactions   Erythromycin Other (See Comments)   Penicillins Hives and Other (See Comments)    Has patient had a PCN reaction causing immediate rash, facial/tongue/throat swelling, SOB or lightheadedness with hypotension: No Has patient had a PCN reaction causing severe rash involving mucus membranes or skin necrosis: No Has patient had a PCN reaction that required hospitalization No Has patient had a PCN reaction occurring within the last 10 years: No If all of the above answers are "NO", then may proceed with Cephalosporin use.  Has tolerated Rocephin    Home Medications    Prior to Admission medications   Medication Sig Start Date End Date Taking? Authorizing Provider  ACCU-CHEK SMARTVIEW test strip USE 1 STRIP TO CHECK GLUCOSE THREE TIMES DAILY FOR FLUCTUATING BLOOD  SUGAR 01/09/21   [provider]  amLODipine (NORVASC) 10 MG tablet Take 10 mg by mouth daily. 01/24/19   [provider]  Ascorbic Acid (VITAMIN C) 1000 MG tablet Take 1,000 mg by mouth daily with breakfast.    [provider]  atorvastatin (LIPITOR) 10 MG tablet Take 10 mg by mouth every evening.    [provider]  Calcium Carb-Cholecalciferol 600-800 MG-UNIT TABS Take 2 tablets by mouth daily with breakfast.    [provider]  cholecalciferol (VITAMIN D) 1000 units tablet Take 1,000 Units by mouth every morning.    [provider]  ciclopirox (LOPROX) 0.77 % cream Apply 1 application topically every morning. APPLY TO THE FEET    [provider]  Coenzyme Q10 (CO Q-10) 100 MG CAPS Take 100 mg by mouth daily with breakfast.     [provider]  digoxin (LANOXIN) 0.125 MG tablet Take 0.125 mg by mouth daily with breakfast.     [provider]  finasteride (PROSCAR) 5 MG tablet Take 1 tablet (5 mg total) by mouth every morning. 10/21/22   McKenzie, Mardene Celeste, MD  furosemide (LASIX) 40 MG tablet Take 40 mg by  mouth daily. 01/03/21   [provider]  HUMALOG KWIKPEN 100 UNIT/ML KwikPen Inject 1-9 Units into the skin 3 (three) times daily. CBG 121 - 150: 1 unit,  CBG 151 - 200: 2 units,  CBG 201 - 250: 3 units,  CBG 251 - 300: 5 units,  CBG 301 - 350: 7 units,  CBG 351 - 400: 9 units 03/28/19   [provider]  Insulin Detemir (LEVEMIR FLEXTOUCH) 100 UNIT/ML Pen Inject 20 Units into the skin daily. Patient taking differently: Inject 60 Units into the skin daily. 02/21/18   Mikhail, Nita Sells, DO  lisinopril (ZESTRIL) 40 MG tablet Take 1 tablet every day by oral route.    [provider]  LORazepam (ATIVAN) 1 MG tablet Take 1 mg by mouth 2 (two) times daily as needed. 05/27/19   [provider]  Multiple Vitamin (MULTIVITAMIN WITH MINERALS) TABS tablet Take 1 tablet by mouth daily with  breakfast.    [provider]  Omega-3 Fatty Acids (FISH OIL) 1200 MG CAPS Take 2 capsules by mouth 2 (two) times daily.    [provider]  OZEMPIC, 0.25 OR 0.5 MG/DOSE, 2 MG/3ML SOPN Inject into the skin. ONCE A WEEK AS DIRECTED PER PT'S WIFE 09/18/22   [provider]  polyethylene glycol (MIRALAX / GLYCOLAX) packet Take 17 g by mouth daily.    [provider]  potassium chloride SA (K-DUR,KLOR-CON) 20 MEQ tablet Take 40 mEq by mouth daily. Take starting 01/07/2018    [provider]  tamsulosin (FLOMAX) 0.4 MG CAPS capsule Take 1 capsule (0.4 mg total) by mouth daily after supper. 10/21/22   McKenzie, Mardene Celeste, MD  VICTOZA 18 MG/3ML SOPN Inject 1.8 mg into the skin daily with breakfast. Patient not taking: Reported on 12/04/2022 06/05/16   [provider]  vitamin E 400 UNIT capsule Take 400 Units by mouth every morning.    [provider]  warfarin (COUMADIN) 5 MG tablet TAKE 1 & 1/2 (ONE & ONE-HALF) TABLETS BY MOUTH ONCE DAILY EXCEPT 1 TABLET ON MONDAYS, WEDNESDAYS AND FRIDAYS OR  AS  DIRECTED  BY  ANTICOAGULATION  CLINIC 03/11/22   Jonelle Sidle, MD    Physical Exam    Vital Signs:  [name] [name] does not have vital signs available for review today.***  Given telephonic nature of communication, physical exam is limited. AAOx3. NAD. Normal affect.  Speech and respirations are unlabored.  Accessory Clinical Findings    None  Assessment & Plan    1.  Preoperative Cardiovascular Risk Assessment: According to the Revised Cardiac Risk Index (RCRI), his    His   according to the [facility] Activity Status Index (DASI).   Per office protocol, patient can hold warfarin for 5 days prior to procedure.   Patient will NOT need bridging with Lovenox (enoxaparin) around procedure.  The patient was advised that if he develops new symptoms prior to surgery to contact our office to arrange for a follow-up visit, and he verbalized  understanding.  Therefore, based on ACC/AHA guidelines, patient would be at acceptable risk for the planned procedure without further cardiovascular testing. I will route this recommendation to the requesting party via Epic fax function.   A copy of this note will be routed to requesting surgeon.  Time:   Today, I have spent *** minutes with the patient with telehealth technology discussing medical history, symptoms, and management plan.     Joni Reining, NP  12/14/2022, 9:09 AM

## 2022-12-15 ENCOUNTER — Ambulatory Visit: Payer: Medicare Other | Attending: Cardiology

## 2022-12-15 DIAGNOSIS — Z299 Encounter for prophylactic measures, unspecified: Secondary | ICD-10-CM | POA: Diagnosis not present

## 2022-12-15 DIAGNOSIS — Z01818 Encounter for other preprocedural examination: Secondary | ICD-10-CM

## 2022-12-15 DIAGNOSIS — I10 Essential (primary) hypertension: Secondary | ICD-10-CM | POA: Diagnosis not present

## 2022-12-15 DIAGNOSIS — Z01810 Encounter for preprocedural cardiovascular examination: Secondary | ICD-10-CM

## 2022-12-15 DIAGNOSIS — I700 Atherosclerosis of aorta: Secondary | ICD-10-CM | POA: Diagnosis not present

## 2022-12-15 DIAGNOSIS — K429 Umbilical hernia without obstruction or gangrene: Secondary | ICD-10-CM | POA: Diagnosis not present

## 2022-12-15 DIAGNOSIS — E1165 Type 2 diabetes mellitus with hyperglycemia: Secondary | ICD-10-CM | POA: Diagnosis not present

## 2022-12-21 NOTE — Patient Instructions (Addendum)
SURGICAL WAITING ROOM VISITATION Patients having surgery or a procedure may have no more than 2 support people in the waiting area - these visitors may rotate in the visitor waiting room.   Due to an increase in RSV and influenza rates and associated hospitalizations, children ages 12 and under may not visit patients in [facility] hospitals. If the patient needs to stay at the hospital during part of their recovery, the visitor guidelines for inpatient rooms apply.  PRE-OP VISITATION  Pre-op nurse will coordinate an appropriate time for 1 support person to accompany the patient in pre-op.  This support person may not rotate.  This visitor will be contacted when the time is appropriate for the visitor to come back in the pre-op area.  Please refer to the [facility] website for the visitor guidelines for Inpatients (after your surgery is over and you are in a regular room).  You are not required to quarantine at this time prior to your surgery. However, you must do this: Hand Hygiene often Do NOT share personal items Notify your provider if you are in close contact with someone who has COVID or you develop fever 100.4 or greater, new onset of sneezing, cough, sore throat, shortness of breath or body aches.  If you test positive for Covid or have been in contact with anyone that has tested positive in the last 10 days please notify you surgeon.    Your procedure is scheduled on:  Tuesday  December 29, 2022  Report to [facility] Main Entrance: [place] entrance where the [employer] is available.   Report to admitting at:  05:45   AM  Call this number if you have any questions or problems the morning of surgery [phone]  Do not eat food after Midnight the night prior to your surgery/procedure.  After Midnight you may have the following liquids until  05:00 AM DAY OF SURGERY  Clear Liquid Diet Water Black Coffee (sugar ok, NO MILK/CREAM OR CREAMERS)  Tea (sugar ok, NO  MILK/CREAM OR CREAMERS) regular and decaf                             Plain Jell-O  with no fruit (NO RED)                                           Fruit ices (not with fruit pulp, NO RED)                                     Popsicles (NO RED)                                                                  Juice: NO CITRUS JUICES: only apple, WHITE grape, WHITE cranberry Sports drinks like Gatorade or Powerade (NO RED)             FOLLOW BOWEL PREP AND ANY ADDITIONAL PRE OP INSTRUCTIONS YOU RECEIVED FROM YOUR SURGEON'S OFFICE!!!   Oral Hygiene is also important to reduce your risk  of infection.        Remember - BRUSH YOUR TEETH THE MORNING OF SURGERY WITH YOUR REGULAR TOOTHPASTE  Do NOT smoke after Midnight the night before surgery.  How to Manage Your Diabetes Before and After Surgery   Why is it important to control my blood sugar before and after surgery? Improving blood sugar levels before and after surgery helps healing and can limit problems. A way of improving blood sugar control is eating a healthy diet by:  Eating less sugar and carbohydrates  Increasing activity/exercise  Talking with your doctor about reaching your blood sugar goals High blood sugars (greater than 180 mg/dL) can raise your risk of infections and slow your recovery, so you will need to focus on controlling your diabetes during the weeks before surgery. Make sure that the doctor who takes care of your diabetes knows about your planned surgery including the date and location.  How do I manage my blood sugar before surgery? Check your blood sugar at least 4 times a day, starting 2 days before surgery, to make sure that the level is not too high or low. Check your blood sugar the morning of your surgery when you wake up and every 2 hours until you get to the Short Stay unit. If your blood sugar is less than 70 mg/dL, you will need to treat for low blood sugar: Do not take insulin. Treat a low blood sugar  (less than 70 mg/dL) with  cup of clear juice (cranberry or apple), 4 glucose tablets, OR glucose gel. Recheck blood sugar in 15 minutes after treatment (to make sure it is greater than 70 mg/dL). If your blood sugar is not greater than 70 mg/dL on recheck, call [phone] for further instructions. Report your blood sugar to the short stay nurse when you get to Short Stay.  If you are admitted to the hospital after surgery: Your blood sugar will be checked by the staff and you will probably be given insulin after surgery (instead of oral diabetes medicines) to make sure you have good blood sugar levels. The goal for blood sugar control after surgery is 80-180 mg/dL.   WHAT DO I DO ABOUT MY DIABETES MEDICATION?  Semaglutide  (Ozempic)  injection on Saturdays   Instructions: Hold x 7 days prior to DOS.  Last injection was on 12-19-2022    Insulin Detemir  (Levemir)-  58 units daily;     Day BEFORE Surgery: If am 100% dose, if Dinner/bedtime the 50% dose or 29 units.     DAY OF SURGERY:  Take 50% or 29 units in the morning.    Humalog Kwik Pen:   Day before Surgery:  Take usual dose, no bedtime dose    DAY OF SURGERY:  if CBG greater than 220 mg/dl, may take 1/2 of usual correction dose.    IF you have any questions, call the nurse at [phone]    STOP TAKING all Vitamins, Herbs and supplements 1 week before your surgery.   Take ONLY these medicines the morning of surgery with A SIP OF WATER: amlodipine, Digoxin (Lanoxin), Finasteride (Proscar).  You may take Tylenol if needed for pain.                     You may not have any metal on your body including jewelry, and body piercing  Do not wear  lotions, powders, cologne, or deodorant   Men may shave face and neck.  Contacts, Hearing Aids, dentures  or bridgework may not be worn into surgery. DENTURES WILL BE REMOVED PRIOR TO SURGERY PLEASE DO NOT APPLY "Poly grip" OR ADHESIVES!!!  You may bring a small overnight bag with you  on the day of surgery, only pack items that are not valuable. [facility] IS NOT RESPONSIBLE   FOR VALUABLES THAT ARE LOST OR STOLEN.   Patients discharged on the day of surgery will not be allowed to drive home.  Someone NEEDS to stay with you for the first 24 hours after anesthesia.  Do not bring your home medications to the hospital. The Pharmacy will dispense medications listed on your medication list to you during your admission in the Hospital.  Special Instructions: Bring a copy of your healthcare power of attorney and living will documents the day of surgery, if you wish to have them scanned into your [facility] Records- EPIC  Please read over the following fact sheets you were given: IF YOU HAVE QUESTIONS ABOUT YOUR PRE-OP INSTRUCTIONS, PLEASE CALL [phone]   [facility] - Preparing for Surgery Before surgery, you can play an important role.  Because skin is not sterile, your skin needs to be as free of germs as possible.  You can reduce the number of germs on your skin by washing with CHG (chlorahexidine gluconate) soap before surgery.  CHG is an antiseptic cleaner which kills germs and bonds with the skin to continue killing germs even after washing. Please DO NOT use if you have an allergy to CHG or antibacterial soaps.  If your skin becomes reddened/irritated stop using the CHG and inform your nurse when you arrive at Short Stay. Do not shave (including legs and underarms) for at least 48 hours prior to the first CHG shower.  You may shave your face/neck.  Please follow these instructions carefully:  1.  Shower with CHG Soap the night before surgery and the  morning of surgery.  2.  If you choose to wash your hair, wash your hair first as usual with your normal  shampoo.  3.  After you shampoo, rinse your hair and body thoroughly to remove the shampoo.                             4.  Use CHG as you would any other liquid soap.  You can apply chg directly to the skin and  wash.  Gently with a scrungie or clean washcloth.  5.  Apply the CHG Soap to your body ONLY FROM THE NECK DOWN.   Do not use on face/ open                           Wound or open sores. Avoid contact with eyes, ears mouth and genitals (private parts).                       Wash face,  Genitals (private parts) with your normal soap.             6.  Wash thoroughly, paying special attention to the area where your  surgery  will be performed.  7.  Thoroughly rinse your body with warm water from the neck down.  8.  DO NOT shower/wash with your normal soap after using and rinsing off the CHG Soap.            9.  Pat yourself dry with a clean  towel.            10.  Wear clean pajamas.            11.  Place clean sheets on your bed the night of your first shower and do not  sleep with pets.  ON THE DAY OF SURGERY : Do not apply any lotions/deodorants the morning of surgery.  Please wear clean clothes to the hospital/surgery center.     FAILURE TO FOLLOW THESE INSTRUCTIONS MAY RESULT IN THE CANCELLATION OF YOUR SURGERY  PATIENT SIGNATURE_________________________________  NURSE SIGNATURE__________________________________  ________________________________________________________________________

## 2022-12-21 NOTE — Progress Notes (Signed)
COVID Vaccine received:  []  No [x]  Yes Date of any COVID positive Test in last 90 days:  PCP - Kirstie Peri, MD Cardiologist - Nona Dell, MD and Joni Reining, NP   cardiac clearance in 12-15-22  Note in Epic  Chest x-ray - 02-19-2018   1v  Epic EKG -  10-23-2022  Epic Stress Test -  ECHO - 02-19-2022   Epic Cardiac Cath -   PCR screen: []  Ordered & Completed           []   No Order but Needs PROFEND           [x]   N/A for this surgery  Surgery Plan:  []  Ambulatory   [x]  Outpatient in bed  []  Admit  Anesthesia:    [x]  General  []  Spinal  []   Choice []   MAC  Bowel Prep - [x]  No  []   Yes ______  Pacemaker / ICD device [x]  No []  Yes   Spinal Cord Stimulator:[x]  No []  Yes       History of Sleep Apnea? [x]  No []  Yes   CPAP used?- [x]  No []  Yes    Does the patient monitor blood sugar?  []  No []  Yes  []  N/A  Patient has: []  NO Hx DM   []  Pre-DM   []  DM1  [x]   DM2 Last A1c was:  6.0  on   03-31-2018   Does patient have a [employer] or Dexacom? []  No []  Yes   Fasting Blood Sugar Ranges-  Checks Blood Sugar _____ times a day  Diabetic medications/ instructions:   GLP1 agonist / usual dose - Semaglutide  (Ozempic)  injection on Saturdays   GLP1 instructions: Hold x 7 days prior to DOS.  Last injection was on 12-19-2022   Insulin Detemir  (Levemir)-  58 units daily;     Day BEFORE Surgery: If am 100% dose, if Dinner/bedtime the 50% dose or 29 units.     DAY OF SURGERY:  Take 50% or 29 units in the morning.   Humalog Kwik Pen:  Day before Surgery:  Take usual dose, no bedtime dose   DAY OF SURGERY:  if CBG greater than 200 mg/dl, may take 1/2 of usual correction dose.   Blood Thinner / Instructions:  Coumadin  Per clearance by Lyman Bishop, PA, patient can hold warfarin for 5 days prior to procedure.   Patient will NOT need bridging with Lovenox (enoxaparin) around procedure. Aspirin Instructions: None  ERAS Protocol Ordered: []  No  [x]  Yes PRE-SURGERY []  ENSURE  []  G2    [x]  No Drink Ordered Patient is to be NPO after: 05:00    Comments:   Activity level: Patient is able / unable to climb a flight of stairs without difficulty; []  No CP  []  No SOB, but would have ___   Patient can / can not perform ADLs without assistance.   Anesthesia review: DM2, A.fib, mild AS ([place]), HTN, CLL, anemia, CHF,  HOH.   Patient denies shortness of breath, fever, cough and chest pain at PAT appointment.  Patient verbalized understanding and agreement to the Pre-Surgical Instructions that were given to them at this PAT appointment. Patient was also educated of the need to review these PAT instructions again prior to his surgery.I reviewed the appropriate phone numbers to call if they have any and questions or concerns.

## 2022-12-22 ENCOUNTER — Encounter (HOSPITAL_COMMUNITY): Payer: Self-pay

## 2022-12-22 ENCOUNTER — Encounter (HOSPITAL_COMMUNITY)
Admission: RE | Admit: 2022-12-22 | Discharge: 2022-12-22 | Disposition: A | Discharge status: Home / Self Care | Payer: Medicare Other | Source: Ambulatory Visit | Attending: Surgery | Admitting: Surgery

## 2022-12-22 ENCOUNTER — Other Ambulatory Visit: Payer: Self-pay

## 2022-12-22 VITALS — BP 131/61 | HR 54 | Temp 98.6°F | Resp 18 | Ht 72.0 in | Wt 260.0 lb

## 2022-12-22 DIAGNOSIS — Z01818 Encounter for other preprocedural examination: Secondary | ICD-10-CM | POA: Diagnosis present

## 2022-12-22 DIAGNOSIS — I10 Essential (primary) hypertension: Secondary | ICD-10-CM | POA: Insufficient documentation

## 2022-12-22 DIAGNOSIS — Z01812 Encounter for preprocedural laboratory examination: Secondary | ICD-10-CM | POA: Diagnosis not present

## 2022-12-22 DIAGNOSIS — E1169 Type 2 diabetes mellitus with other specified complication: Secondary | ICD-10-CM | POA: Insufficient documentation

## 2022-12-22 HISTORY — DX: Anemia, unspecified: D64.9

## 2022-12-22 HISTORY — DX: Heart failure, unspecified: I50.9

## 2022-12-22 HISTORY — DX: Unspecified osteoarthritis, unspecified site: M19.90

## 2022-12-22 HISTORY — DX: Nonrheumatic aortic (valve) stenosis: I35.0

## 2022-12-22 HISTORY — DX: Bell's palsy: G51.0

## 2022-12-22 HISTORY — DX: Cardiac murmur, unspecified: R01.1

## 2022-12-22 HISTORY — DX: Personal history of urinary calculi: Z87.442

## 2022-12-22 HISTORY — DX: Unspecified hearing loss, unspecified ear: H91.90

## 2022-12-22 LAB — BASIC METABOLIC PANEL
Anion gap: 9 (ref 5–15)
BUN: 27 mg/dL — ABNORMAL HIGH (ref 8–23)
CO2: 28 mmol/L (ref 22–32)
Calcium: 9.7 mg/dL (ref 8.9–10.3)
Chloride: 101 mmol/L (ref 98–111)
Creatinine, Ser: 0.82 mg/dL (ref 0.61–1.24)
GFR, Estimated: >60 mL/min (ref >60)
Glucose, Bld: 65 mg/dL — ABNORMAL LOW (ref 70–99)
Potassium: 4.2 mmol/L (ref 3.5–5.1)
Sodium: 138 mmol/L (ref 135–145)

## 2022-12-22 LAB — CBC
HCT: 43.2 % (ref 39.0–52.0)
Hemoglobin: 13.9 g/dL (ref 13.0–17.0)
MCH: 29.5 pg (ref 26.0–34.0)
MCHC: 32.2 g/dL (ref 30.0–36.0)
MCV: 91.7 fL (ref 80.0–100.0)
Platelets: 165 10*3/uL (ref 150–400)
RBC: 4.71 MIL/uL (ref 4.22–5.81)
RDW: 15.1 % (ref 11.5–15.5)
WBC: 11.4 10*3/uL — ABNORMAL HIGH (ref 4.0–10.5)
nRBC: 0 % (ref 0.0–0.2)

## 2022-12-22 LAB — GLUCOSE, CAPILLARY: Glucose-Capillary: 71 mg/dL (ref 70–99)

## 2022-12-23 NOTE — Anesthesia Preprocedure Evaluation (Addendum)
Anesthesia Evaluation  Patient identified by MRN, date of birth, ID band Patient awake    Reviewed: Allergy & Precautions, NPO status , Patient's Chart, lab work & pertinent test results  History of Anesthesia Complications Negative for: history of anesthetic complications  Airway Mallampati: II  TM Distance: >3 FB Neck ROM: Full    Dental  (+) Missing,    Pulmonary former smoker   Pulmonary exam normal        Cardiovascular hypertension, Pt. on medications + CAD and +CHF  Normal cardiovascular exam+ dysrhythmias (on Coumadin) Atrial Fibrillation   Echo 02/19/2022: EF 60-65%, mild LVH, mild pHTN, severe LAE, moderate RAE, mild MR, mild AS      Neuro/Psych   Anxiety        GI/Hepatic negative GI ROS, Neg liver ROS,,,  Endo/Other  diabetes (on Ozempic), Type 2, Insulin Dependent    Renal/GU      Musculoskeletal  (+) Arthritis ,    Abdominal   Peds  Hematology negative hematology ROS (+)   Anesthesia Other Findings Day of surgery medications reviewed with patient.  Reproductive/Obstetrics                              Anesthesia Physical Anesthesia Plan  ASA: 3  Anesthesia Plan: General   Post-op Pain Management: Tylenol PO (pre-op)*   Induction: Intravenous  PONV Risk Score and Plan: 2 and Treatment may vary due to age or medical condition, Ondansetron and Dexamethasone  Airway Management Planned: Oral ETT  Additional Equipment: None  Intra-op Plan:   Post-operative Plan: Extubation in OR  Informed Consent: I have reviewed the patients History and Physical, chart, labs and discussed the procedure including the risks, benefits and alternatives for the proposed anesthesia with the patient or authorized representative who has indicated his/her understanding and acceptance.     Dental advisory given  Plan Discussed with: CRNA  Anesthesia Plan Comments: (See PAT note  12/22/22, Christeen Douglas, PA-C)        Anesthesia Quick Evaluation

## 2022-12-23 NOTE — Progress Notes (Signed)
Anesthesia Chart Review   Case: [id number] Date/Time: 12/29/22 0745   Procedure: OPEN UMBILICAL HERNIA REPAIR WITH MESH   Anesthesia type: General   Pre-op diagnosis: UMBILICAL HERNIA   Location: [facility]OM 04 / [facility]   Surgeons: [name], MD       DISCUSSION:88 y.o. former smoker with h/o HTN, nonobstructive CAD, CHF, mild AS, permanent atrial fibrillation, RBBB, CLL, anemia, DM II, Bell's Palsy, umbilical hernia scheduled for above procedure 12/29/2022 with Dr. [name].   Pt seen by cardiology 12/15/2022. Per OV note, "According to the Revised Cardiac Risk Index (RCRI), his Perioperative Risk of Major Cardiac Event is (%): 0.9   His Functional Capacity in METs is: 5.72 according to the Duke Activity Status Index (DASI).  Activity is limited by weight, musculoskeletal issues, and use of walker for ambulation.   Per office protocol, patient can hold warfarin for 5 days prior to procedure.   Patient will NOT need bridging with Lovenox (enoxaparin) around procedure.   The patient was advised that if he develops new symptoms prior to surgery to contact our office to arrange for a follow-up visit, and he verbalized understanding.  They also know to follow-up with Coumadin clinic in [place] concerning restarting warfarin therapy through Ronnie Derby, RN post procedure.   Therefore, based on ACC/AHA guidelines, patient would be at acceptable risk for the planned procedure without further cardiovascular testing. I will route this recommendation to the requesting party via Epic fax function."  Anticipate pt can proceed with planned procedure barring acute status change.   VS: BP 131/61 Comment: right arm sitting  Pulse (!) 54   Temp 37 C (Oral)   Resp 18   Ht 6' (1.829 m)   Wt 117.9 kg   SpO2 97%   BMI 35.26 kg/m   PROVIDERS: Kirstie Peri, MD is PCP   Nona Dell, MD is Cardiologist  LABS: Labs reviewed: Acceptable for surgery. (all labs ordered are listed, but  only abnormal results are displayed)  Labs Reviewed  BASIC METABOLIC PANEL - Abnormal; Notable for the following components:      Result Value   Glucose, Bld 65 (*)    BUN 27 (*)    All other components within normal limits  CBC - Abnormal; Notable for the following components:   WBC 11.4 (*)    All other components within normal limits  GLUCOSE, CAPILLARY  HEMOGLOBIN A1C     IMAGES:   EKG:   CV: Echo 02/19/2022  1. Left ventricular ejection fraction, by estimation, is 60 to 65%. The  left ventricle has normal function. The left ventricle has no regional  wall motion abnormalities. There is mild left ventricular hypertrophy.  Left ventricular diastolic parameters  are indeterminate. The average left ventricular global longitudinal strain  is -25.2 %. The global longitudinal strain is normal.   2. RV-RA gradient 27 mmHg suggests normal to mildly increased RVSP  depending on CVP. Right ventricular systolic function is normal. The right  ventricular size is normal.   3. Left atrial size was severely dilated.   4. Right atrial size was moderately dilated.   5. The mitral valve is grossly normal. Mild mitral valve regurgitation.   6. The aortic valve is tricuspid. There is moderate calcification of the  aortic valve. Aortic valve regurgitation is mild. Mild aortic valve  stenosis. Aortic regurgitation PHT measures 650 msec. Aortic valve mean  gradient measures 17.8 mmHg.  Dimentionless index 0.44.   7. Unable to estimate CVP.  Comparison(s): Prior images reviewed side by side. LVEF remains normal at  60-65%. Mild aortic stenosis with mild aortic regurgitation.  Past Medical History:  Diagnosis Date   Anemia    Aortic stenosis    mild   Arthritis    Atrial fibrillation (HCC)    Bell's palsy    right side of face, noticable when he smalls   CHF (congestive heart failure) (HCC)    Chronic lymphocytic leukemia (HCC)    Coronary atherosclerosis of native coronary artery     Nonobstructive   Dysrhythmia    A.fib   Essential hypertension    Heart murmur    History of kidney stones    HOH (hard of hearing)    Hyperlipidemia    IgA nephropathy    IgA nephropathy    Type 2 diabetes mellitus (HCC)    UTI (urinary tract infection)     Past Surgical History:  Procedure Laterality Date   AMPUTATION Left 09/15/2012   Procedure: PARTIAL AMPUTATION 3rd TOE LEFT FOOT;  Surgeon: Dallas Schimke, DPM;  Location: [facility];  Service: Orthopedics;  Laterality: Left;   AMPUTATION Left 04/20/2013   Procedure: PARTIAL AMPUTATION 2ND TOE LEFT FOOT;  Surgeon: Dallas Schimke, DPM;  Location: [facility];  Service: Orthopedics;  Laterality: Left;   ANAL FISSURE REPAIR     APPLICATION OF WOUND VAC Right 09/09/2016   Procedure: APPLICATION OF WOUND VAC;  Surgeon: Ferman Hamming, DPM;  Location: [facility];  Service: Podiatry;  Laterality: Right;   CATARACT EXTRACTION W/PHACO Right 09/30/2015   Procedure: CATARACT EXTRACTION PHACO AND INTRAOCULAR LENS PLACEMENT (IOC);  Surgeon: Gemma Payor, MD;  Location: [facility];  Service: Ophthalmology;  Laterality: Right;  CDE: 11.78   CATARACT EXTRACTION W/PHACO Left 10/31/2015   Procedure: CATARACT EXTRACTION PHACO AND INTRAOCULAR LENS PLACEMENT LEFT EYE; CDE:  9.10;  Surgeon: Gemma Payor, MD;  Location: [facility];  Service: Ophthalmology;  Laterality: Left;   COLONOSCOPY  08/19/2011   Procedure: COLONOSCOPY;  Surgeon: Malissa Hippo, MD;  [facility];  Service: Endoscopy;  Laterality: N/A;  930   COLONOSCOPY N/A 09/19/2014   Procedure: COLONOSCOPY;  Surgeon: Malissa Hippo, MD;  [facility];  Service: Endoscopy;  Laterality: N/A;  1055   FEMORAL HERNIA REPAIR     INCISION AND DRAINAGE Right 09/09/2016   Procedure: DEBRIDEMENT WOUND RT heel with wound vac attachment;  Surgeon: Ferman Hamming, DPM;  Location: [facility];  Service: Podiatry;  Laterality: Right;   INCISION AND DRAINAGE ABSCESS Right 02/16/2018    Procedure: INCISION AND DRAINAGE ABSCESS;  Surgeon: Newman Pies, MD;  Location: [facility];  Service: ENT;  Laterality: Right;   INCISION AND DRAINAGE OF WOUND Right 08/12/2016   Procedure: DEBRIDEMENT WOUND RIGHT HEEL;  Surgeon: Ferman Hamming, DPM;  Location: [facility];  Service: Podiatry;  Laterality: Right;  right heel   MASS EXCISION Right 09/28/2017   Procedure: EXCISION OF EXTERNAL AUDITORY CANAL MASS;  Surgeon: Newman Pies, MD;  Location: [facility];  Service: ENT;  Laterality: Right;   MASTOIDECTOMY Right 02/16/2018   Procedure: REVISION MASTOIDECTOMY;  Surgeon: Newman Pies, MD;  Location: [facility];  Service: ENT;  Laterality: Right;   Open repair left quadriceps tendon.  08/22/2007   Dr. Romeo Apple   Right total knee replacement     TONSILLECTOMY     TRANSURETHRAL RESECTION OF PROSTATE     TYMPANOMASTOIDECTOMY Right 11/10/2017   Procedure: RIGHT TYMPANOMASTOIDECTOMY;  Surgeon: Newman Pies, MD;  Location: [facility];  Service: ENT;  Laterality: Right;   WOUND EXPLORATION Right 08/12/2016   Procedure: EXPLORATION OF WOUND FOR FOREIGN BODY RIGHT HEEL;  Surgeon: Ferman Hamming, DPM;  Location: [facility];  Service: Podiatry;  Laterality: Right;  right heel    MEDICATIONS:  ACCU-CHEK SMARTVIEW test strip   acetaminophen (TYLENOL) 500 MG tablet   amLODipine (NORVASC) 10 MG tablet   Ascorbic Acid (VITAMIN C) 1000 MG tablet   atorvastatin (LIPITOR) 10 MG tablet   Calcium Carb-Cholecalciferol 600-800 MG-UNIT TABS   cholecalciferol (VITAMIN D) 1000 units tablet   ciclopirox (LOPROX) 0.77 % cream   Coenzyme Q10 (CO Q-10) 100 MG CAPS   digoxin (LANOXIN) 0.125 MG tablet   finasteride (PROSCAR) 5 MG tablet   furosemide (LASIX) 40 MG tablet   HUMALOG KWIKPEN 100 UNIT/ML KwikPen   Insulin Detemir (LEVEMIR FLEXTOUCH) 100 UNIT/ML Pen   lisinopril (ZESTRIL) 40 MG tablet   LORazepam (ATIVAN) 1 MG tablet   Multiple Vitamin (MULTIVITAMIN WITH MINERALS) TABS tablet   Omega-3 Fatty  Acids (FISH OIL) 1200 MG CAPS   polyethylene glycol (MIRALAX / GLYCOLAX) packet   potassium chloride SA (K-DUR,KLOR-CON) 20 MEQ tablet   Semaglutide, 1 MG/DOSE, (OZEMPIC, 1 MG/DOSE,) 2 MG/1.5ML SOPN   tamsulosin (FLOMAX) 0.4 MG CAPS capsule   triamcinolone cream (KENALOG) 0.1 %   vitamin E 400 UNIT capsule   warfarin (COUMADIN) 5 MG tablet   No current facility-administered medications for this encounter.      Jodell Cipro Ward, PA-C [facility] [phone]

## 2022-12-24 DIAGNOSIS — E1142 Type 2 diabetes mellitus with diabetic polyneuropathy: Secondary | ICD-10-CM | POA: Diagnosis not present

## 2022-12-24 DIAGNOSIS — L84 Corns and callosities: Secondary | ICD-10-CM | POA: Diagnosis not present

## 2022-12-24 DIAGNOSIS — B351 Tinea unguium: Secondary | ICD-10-CM | POA: Diagnosis not present

## 2022-12-24 LAB — HEMOGLOBIN A1C
Hgb A1c MFr Bld: 6.2 % — ABNORMAL HIGH (ref 4.8–5.6)
Mean Plasma Glucose: 131 mg/dL

## 2022-12-28 NOTE — H&P (Signed)
REFERRING PHYSICIAN: Axel Filler, MD PROVIDER: Wayne Both, MD MRN: [id number] DOB: [birth year] DATE OF ENCOUNTER: 12/01/2022 Subjective   Chief Complaint: New Consultation  History of Present Illness: [name] [name] is a 88 y.o. male who is seen today as an office consultation for evaluation of a large in the hernia  This is an 88 year old gentleman who is referred here for evaluation of a large umbilical hernia. He was actually seen by surgeons at [facility] and surgery was scheduled for October 2 but his wife is very uncomfortable trying to drive to [facility] for the surgery. They requested to be seen here so surgery could be done in [place]. He has had the hernia for many years but is getting larger and causing more discomfort. He is also started to have some skin breakdown. He is on Coumadin for A-fib. He underwent a recent CT scan of the abdomen and pelvis showing the hernia containing small bowel but no evidence of obstruction. He denies chest pain or shortness of breath  Review of Systems: A complete review of systems was obtained from the patient. I have reviewed this information and discussed as appropriate with the patient. See HPI as well for other ROS.  ROS   Medical History: Past Medical History:  Diagnosis Date  Arthritis  Diabetes mellitus without complication (CMS/HHS-HCC)  Hypertension   There is no problem list on file for this patient.  Past Surgical History:  Procedure Laterality Date  Anal Fissure repair  CATARACT EXTRACTION  Ear surgery  HERNIA REPAIR  JOINT REPLACEMENT    Allergies  Allergen Reactions  Erythromycin Other (See Comments) and Rash  Penicillins Hives and Rash  Has patient had a PCN reaction causing immediate rash, facial/tongue/throat swelling, SOB or lightheadedness with hypotension: No Has patient had a PCN reaction causing severe rash involving mucus membranes or skin necrosis: No Has patient had a PCN reaction that  required hospitalization No Has patient had a PCN reaction occurring within the last 10 years: No If all of the above answers are "NO", then may proceed with Cephalosporin use. Has tolerated Rocephin  Has patient had a PCN reaction causing immediate rash, facial/tongue/throat swelling, SOB or lightheadedness with hypotension: No Has patient had a PCN reaction causing severe rash involving mucus membranes or skin necrosis: No Has patient had a PCN reaction that required hospitalization No Has patient had a PCN reaction occurring within the last 10 years: No If all of the above answers are "NO", then may proceed with Cephalosporin use.  Has tolerated Rocephin   Current Outpatient Medications on File Prior to Visit  Medication Sig Dispense Refill  amLODIPine (NORVASC) 10 MG tablet Take 10 mg by mouth once daily  atorvastatin (LIPITOR) 10 MG tablet Take 1 tablet by mouth once daily  calcium carbonate-vitamin D3 600 mg-20 mcg (800 unit) Tab  cholecalciferol (VITAMIN D3) 1000 unit tablet Take 1,000 Units by mouth every morning  ciclopirox-skin cleanser no.40 0.77 % Cmpk Apply topically  co-enzyme Q-10, ubiquinone, 100 mg capsule Take by mouth  digoxin (LANOXIN) 0.125 MG tablet Take 125 mcg by mouth once daily  DOCOSAHEXAENOIC ACID ORAL  finasteride (PROSCAR) 5 mg tablet Take 1 tablet by mouth once daily  FUROsemide (LASIX) 40 MG tablet Take 40 mg by mouth once daily  insulin LISPRO (ADMELOG, HUMALOG) injection (concentration 100 units/mL) Inject subcutaneously  lisinopriL (ZESTRIL) 40 MG tablet Take 1 tablet by mouth once daily  LORazepam (ATIVAN) 1 MG tablet Take by mouth 2 (two) times daily as needed  OZEMPIC 0.25 mg or 0.5 mg (2 mg/3 mL) pen injector Inject subcutaneously every 7 (seven) days  polyethylene glycol (MIRALAX) packet Take 17 g by mouth once daily  potassium chloride (KLOR-CON M20) 20 MEQ ER tablet Take 20 mEq by mouth once daily  tamsulosin (FLOMAX) 0.4 mg capsule Take by mouth   vitamin E 400 UNIT capsule Take 400 Units by mouth once daily  warfarin (COUMADIN) 5 MG tablet Take by mouth   No current facility-administered medications on file prior to visit.   Family History  Problem Relation Age of Onset  Colon cancer Mother  Colon cancer Father    Social History   Tobacco Use  Smoking Status Never  Smokeless Tobacco Never    Social History   Socioeconomic History  Marital status: Married  Tobacco Use  Smoking status: Never  Smokeless tobacco: Never  Substance and Sexual Activity  Alcohol use: Never  Drug use: Never   Objective:   Vitals:  12/01/22 1444 12/01/22 1445  BP: 138/80  Pulse: 76  Temp: 36.7 C (98.1 F)  SpO2: 98%  Weight: (!) 117.7 kg (259 lb 6.4 oz)  Height: 182.9 cm (6')  PainSc: 0-No pain  PainLoc: Abdomen   Body mass index is 35.18 kg/m.  Physical Exam   He appears well on exam.  His abdomen is soft and obese. There is a large, chronically incarcerated but nontender umbilical hernia. There is started to be some slight skin breakdown at the hernia.  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 I agree with the surgeon who saw him at [facility] regarding an open repair with mesh. I explained the surgical procedure in detail. We discussed the risks which includes but is not limited to bleeding, infection, injury to surrounding structures, use of mesh, risk of hernia recurrence, cardiopulmonary issues with anesthesia, postoperative recovery, etc. He will need to stop his Coumadin 5 days preoperatively. He and his wife understand and agree to proceed with surgery which will be scheduled soon as  possible

## 2022-12-29 ENCOUNTER — Observation Stay (HOSPITAL_COMMUNITY)
Admission: RE | Admit: 2022-12-29 | Discharge: 2022-12-30 | Disposition: A | Discharge status: Home / Self Care | Payer: Medicare Other | Attending: Surgery | Admitting: Surgery

## 2022-12-29 ENCOUNTER — Ambulatory Visit (HOSPITAL_COMMUNITY): Payer: Medicare Other | Admitting: Physician Assistant

## 2022-12-29 ENCOUNTER — Ambulatory Visit (HOSPITAL_BASED_OUTPATIENT_CLINIC_OR_DEPARTMENT_OTHER): Payer: Medicare Other | Admitting: Anesthesiology

## 2022-12-29 ENCOUNTER — Encounter (HOSPITAL_COMMUNITY): Payer: Self-pay | Admitting: Surgery

## 2022-12-29 ENCOUNTER — Other Ambulatory Visit: Payer: Self-pay

## 2022-12-29 ENCOUNTER — Encounter (HOSPITAL_COMMUNITY)
Admission: RE | Disposition: A | Discharge status: Home / Self Care | Payer: Self-pay | Source: Home / Self Care | Attending: Surgery

## 2022-12-29 DIAGNOSIS — I10 Essential (primary) hypertension: Secondary | ICD-10-CM | POA: Insufficient documentation

## 2022-12-29 DIAGNOSIS — K420 Umbilical hernia with obstruction, without gangrene: Principal | ICD-10-CM | POA: Diagnosis present

## 2022-12-29 DIAGNOSIS — I4891 Unspecified atrial fibrillation: Secondary | ICD-10-CM | POA: Diagnosis not present

## 2022-12-29 DIAGNOSIS — E119 Type 2 diabetes mellitus without complications: Secondary | ICD-10-CM | POA: Insufficient documentation

## 2022-12-29 DIAGNOSIS — I110 Hypertensive heart disease with heart failure: Secondary | ICD-10-CM | POA: Diagnosis not present

## 2022-12-29 DIAGNOSIS — K429 Umbilical hernia without obstruction or gangrene: Secondary | ICD-10-CM | POA: Diagnosis not present

## 2022-12-29 DIAGNOSIS — Z7901 Long term (current) use of anticoagulants: Secondary | ICD-10-CM | POA: Diagnosis not present

## 2022-12-29 DIAGNOSIS — I5032 Chronic diastolic (congestive) heart failure: Secondary | ICD-10-CM | POA: Diagnosis not present

## 2022-12-29 DIAGNOSIS — Z794 Long term (current) use of insulin: Secondary | ICD-10-CM | POA: Insufficient documentation

## 2022-12-29 DIAGNOSIS — I2510 Atherosclerotic heart disease of native coronary artery without angina pectoris: Secondary | ICD-10-CM

## 2022-12-29 DIAGNOSIS — I509 Heart failure, unspecified: Secondary | ICD-10-CM | POA: Diagnosis not present

## 2022-12-29 DIAGNOSIS — Z87891 Personal history of nicotine dependence: Secondary | ICD-10-CM

## 2022-12-29 DIAGNOSIS — E1169 Type 2 diabetes mellitus with other specified complication: Secondary | ICD-10-CM

## 2022-12-29 HISTORY — PX: UMBILICAL HERNIA REPAIR: SHX196

## 2022-12-29 LAB — GLUCOSE, CAPILLARY
Glucose-Capillary: 98 mg/dL (ref 70–99)
Glucose-Capillary: 89 mg/dL (ref 70–99)
Glucose-Capillary: 108 mg/dL — ABNORMAL HIGH (ref 70–99)
Glucose-Capillary: 225 mg/dL — ABNORMAL HIGH (ref 70–99)
Glucose-Capillary: 259 mg/dL — ABNORMAL HIGH (ref 70–99)

## 2022-12-29 SURGERY — REPAIR, HERNIA, UMBILICAL, ADULT
Anesthesia: General

## 2022-12-29 MED ORDER — PROPOFOL 10 MG/ML IV BOLUS
INTRAVENOUS | Status: AC
  Filled 2022-12-29: qty 20

## 2022-12-29 MED ORDER — DROPERIDOL 2.5 MG/ML IJ SOLN: 0.6250 mg | Freq: Once | INTRAMUSCULAR | Status: DC | PRN

## 2022-12-29 MED ORDER — LIDOCAINE 2% (20 MG/ML) 5 ML SYRINGE
INTRAMUSCULAR | Status: DC | PRN
  Administered 2022-12-29: 100 mg via INTRAVENOUS

## 2022-12-29 MED ORDER — OXYCODONE HCL 5 MG PO TABS
ORAL_TABLET | ORAL | Status: AC
  Filled 2022-12-29: qty 1

## 2022-12-29 MED ORDER — DEXAMETHASONE SODIUM PHOSPHATE 10 MG/ML IJ SOLN
INTRAMUSCULAR | Status: DC | PRN
  Administered 2022-12-29: 4 mg via INTRAVENOUS

## 2022-12-29 MED ORDER — OXYCODONE HCL 5 MG/5ML PO SOLN: 5.0000 mg | Freq: Once | ORAL | Status: AC | PRN

## 2022-12-29 MED ORDER — CIPROFLOXACIN IN D5W 400 MG/200ML IV SOLN
400.0000 mg | INTRAVENOUS | Status: AC
  Administered 2022-12-29: 400 mg via INTRAVENOUS
  Filled 2022-12-29: qty 200

## 2022-12-29 MED ORDER — CHLORHEXIDINE GLUCONATE 0.12 % MT SOLN
15.0000 mL | Freq: Once | OROMUCOSAL | Status: AC
  Administered 2022-12-29: 15 mL via OROMUCOSAL

## 2022-12-29 MED ORDER — EPHEDRINE SULFATE-NACL 50-0.9 MG/10ML-% IV SOSY
PREFILLED_SYRINGE | INTRAVENOUS | Status: DC | PRN
Start: 2022-12-29 — End: 2022-12-29
  Administered 2022-12-29 (×2): 10 mg via INTRAVENOUS

## 2022-12-29 MED ORDER — FENTANYL CITRATE (PF) 100 MCG/2ML IJ SOLN
INTRAMUSCULAR | Status: AC
  Filled 2022-12-29: qty 2

## 2022-12-29 MED ORDER — TAMSULOSIN HCL 0.4 MG PO CAPS
0.4000 mg | ORAL_CAPSULE | Freq: Every day | ORAL | Status: DC
  Administered 2022-12-29: 0.4 mg via ORAL
  Filled 2022-12-29: qty 1

## 2022-12-29 MED ORDER — TRAMADOL HCL 50 MG PO TABS: 50.0000 mg | ORAL_TABLET | Freq: Four times a day (QID) | ORAL | Status: DC | PRN

## 2022-12-29 MED ORDER — ONDANSETRON HCL 4 MG/2ML IJ SOLN: 4.0000 mg | Freq: Four times a day (QID) | INTRAMUSCULAR | Status: DC | PRN

## 2022-12-29 MED ORDER — PHENYLEPHRINE 80 MCG/ML (10ML) SYRINGE FOR IV PUSH (FOR BLOOD PRESSURE SUPPORT)
PREFILLED_SYRINGE | INTRAVENOUS | Status: AC
  Filled 2022-12-29: qty 10

## 2022-12-29 MED ORDER — HYDROMORPHONE HCL 1 MG/ML IJ SOLN: 0.5000 mg | INTRAMUSCULAR | Status: DC | PRN

## 2022-12-29 MED ORDER — ORAL CARE MOUTH RINSE: 15.0000 mL | Freq: Once | OROMUCOSAL | Status: AC

## 2022-12-29 MED ORDER — POTASSIUM CHLORIDE IN NACL 20-0.9 MEQ/L-% IV SOLN
INTRAVENOUS | Status: DC
  Filled 2022-12-29: qty 1000

## 2022-12-29 MED ORDER — ROCURONIUM BROMIDE 10 MG/ML (PF) SYRINGE
PREFILLED_SYRINGE | INTRAVENOUS | Status: AC
  Filled 2022-12-29: qty 10

## 2022-12-29 MED ORDER — ONDANSETRON HCL 4 MG/2ML IJ SOLN
INTRAMUSCULAR | Status: AC
  Filled 2022-12-29: qty 2

## 2022-12-29 MED ORDER — BUPIVACAINE HCL (PF) 0.5 % IJ SOLN
INTRAMUSCULAR | Status: DC | PRN
  Administered 2022-12-29: 20 mL

## 2022-12-29 MED ORDER — BUPIVACAINE-EPINEPHRINE (PF) 0.5% -1:200000 IJ SOLN
INTRAMUSCULAR | Status: DC | PRN
Start: 2022-12-29 — End: 2022-12-29

## 2022-12-29 MED ORDER — ACETAMINOPHEN 500 MG PO TABS
1000.0000 mg | ORAL_TABLET | Freq: Once | ORAL | Status: DC
Start: 2022-12-29 — End: 2022-12-29

## 2022-12-29 MED ORDER — OXYCODONE HCL 5 MG PO TABS
5.0000 mg | ORAL_TABLET | Freq: Once | ORAL | Status: AC | PRN
  Administered 2022-12-29: 5 mg via ORAL

## 2022-12-29 MED ORDER — FENTANYL CITRATE PF 50 MCG/ML IJ SOSY
PREFILLED_SYRINGE | INTRAMUSCULAR | Status: AC
  Filled 2022-12-29: qty 1

## 2022-12-29 MED ORDER — SUGAMMADEX SODIUM 200 MG/2ML IV SOLN
INTRAVENOUS | Status: DC | PRN
  Administered 2022-12-29: 200 mg via INTRAVENOUS

## 2022-12-29 MED ORDER — BUPIVACAINE LIPOSOME 1.3 % IJ SUSP
INTRAMUSCULAR | Status: DC | PRN
Start: 2022-12-29 — End: 2022-12-29

## 2022-12-29 MED ORDER — LISINOPRIL 20 MG PO TABS: 40.0000 mg | ORAL_TABLET | Freq: Every day | ORAL | Status: DC

## 2022-12-29 MED ORDER — DEXAMETHASONE SODIUM PHOSPHATE 10 MG/ML IJ SOLN
INTRAMUSCULAR | Status: AC
  Filled 2022-12-29: qty 1

## 2022-12-29 MED ORDER — DIGOXIN 125 MCG PO TABS
0.1250 mg | ORAL_TABLET | Freq: Every day | ORAL | Status: DC
  Administered 2022-12-29: 0.125 mg via ORAL
  Filled 2022-12-29 (×2): qty 1

## 2022-12-29 MED ORDER — BUPIVACAINE HCL (PF) 0.5 % IJ SOLN
INTRAMUSCULAR | Status: AC
  Filled 2022-12-29: qty 30

## 2022-12-29 MED ORDER — ACETAMINOPHEN 500 MG PO TABS
1000.0000 mg | ORAL_TABLET | ORAL | Status: AC
  Administered 2022-12-29: 1000 mg via ORAL
  Filled 2022-12-29: qty 2

## 2022-12-29 MED ORDER — INSULIN ASPART 100 UNIT/ML IJ SOLN
0.0000 [IU] | Freq: Every day | INTRAMUSCULAR | Status: DC
  Administered 2022-12-29: 3 [IU] via SUBCUTANEOUS

## 2022-12-29 MED ORDER — CO Q-10 100 MG PO CAPS: 100.0000 mg | ORAL_CAPSULE | Freq: Every day | ORAL | Status: DC

## 2022-12-29 MED ORDER — 0.9 % SODIUM CHLORIDE (POUR BTL) OPTIME
TOPICAL | Status: DC | PRN
Start: 2022-12-29 — End: 2022-12-29
  Administered 2022-12-29: 1000 mL

## 2022-12-29 MED ORDER — ONDANSETRON 4 MG PO TBDP: 4.0000 mg | ORAL_TABLET | Freq: Four times a day (QID) | ORAL | Status: DC | PRN

## 2022-12-29 MED ORDER — CHLORHEXIDINE GLUCONATE CLOTH 2 % EX PADS: 6.0000 | MEDICATED_PAD | Freq: Once | CUTANEOUS | Status: DC

## 2022-12-29 MED ORDER — ENOXAPARIN SODIUM 40 MG/0.4ML IJ SOSY
40.0000 mg | PREFILLED_SYRINGE | INTRAMUSCULAR | Status: DC
  Administered 2022-12-30: 40 mg via SUBCUTANEOUS
  Filled 2022-12-29: qty 0.4

## 2022-12-29 MED ORDER — EPHEDRINE 5 MG/ML INJ
INTRAVENOUS | Status: AC
  Filled 2022-12-29: qty 5

## 2022-12-29 MED ORDER — LIDOCAINE HCL (PF) 2 % IJ SOLN
INTRAMUSCULAR | Status: AC
  Filled 2022-12-29: qty 5

## 2022-12-29 MED ORDER — DIPHENHYDRAMINE HCL 12.5 MG/5ML PO ELIX: 12.5000 mg | ORAL_SOLUTION | Freq: Four times a day (QID) | ORAL | Status: DC | PRN

## 2022-12-29 MED ORDER — ROCURONIUM BROMIDE 50 MG/5ML IV SOSY
PREFILLED_SYRINGE | INTRAVENOUS | Status: DC | PRN
  Administered 2022-12-29: 70 mg via INTRAVENOUS

## 2022-12-29 MED ORDER — AMLODIPINE BESYLATE 10 MG PO TABS
10.0000 mg | ORAL_TABLET | Freq: Every day | ORAL | Status: DC
  Administered 2022-12-29: 10 mg via ORAL
  Filled 2022-12-29: qty 1

## 2022-12-29 MED ORDER — OXYCODONE HCL 5 MG PO TABS: 5.0000 mg | ORAL_TABLET | ORAL | Status: DC | PRN

## 2022-12-29 MED ORDER — LACTATED RINGERS IV SOLN: INTRAVENOUS | Status: DC

## 2022-12-29 MED ORDER — FENTANYL CITRATE (PF) 100 MCG/2ML IJ SOLN
INTRAMUSCULAR | Status: DC | PRN
Start: 2022-12-29 — End: 2022-12-29
  Administered 2022-12-29: 25 ug via INTRAVENOUS
  Administered 2022-12-29: 50 ug via INTRAVENOUS

## 2022-12-29 MED ORDER — GLYCOPYRROLATE 0.2 MG/ML IJ SOLN
INTRAMUSCULAR | Status: DC | PRN
Start: 2022-12-29 — End: 2022-12-29
  Administered 2022-12-29: .2 mg via INTRAVENOUS

## 2022-12-29 MED ORDER — ACETAMINOPHEN 500 MG PO TABS: 1000.0000 mg | ORAL_TABLET | Freq: Four times a day (QID) | ORAL | Status: DC

## 2022-12-29 MED ORDER — FINASTERIDE 5 MG PO TABS
5.0000 mg | ORAL_TABLET | Freq: Every morning | ORAL | Status: DC
  Administered 2022-12-29: 5 mg via ORAL
  Filled 2022-12-29: qty 1

## 2022-12-29 MED ORDER — FUROSEMIDE 40 MG PO TABS
40.0000 mg | ORAL_TABLET | Freq: Every day | ORAL | Status: DC
  Filled 2022-12-29: qty 1

## 2022-12-29 MED ORDER — FENTANYL CITRATE PF 50 MCG/ML IJ SOSY
25.0000 ug | PREFILLED_SYRINGE | INTRAMUSCULAR | Status: DC | PRN
  Administered 2022-12-29: 50 ug via INTRAVENOUS

## 2022-12-29 MED ORDER — DIPHENHYDRAMINE HCL 50 MG/ML IJ SOLN: 12.5000 mg | Freq: Four times a day (QID) | INTRAMUSCULAR | Status: DC | PRN

## 2022-12-29 MED ORDER — PROPOFOL 10 MG/ML IV BOLUS
INTRAVENOUS | Status: DC | PRN
  Administered 2022-12-29: 60 mg via INTRAVENOUS
  Administered 2022-12-29: 140 mg via INTRAVENOUS

## 2022-12-29 MED ORDER — ACETAMINOPHEN 500 MG PO TABS
1000.0000 mg | ORAL_TABLET | Freq: Four times a day (QID) | ORAL | Status: DC
  Administered 2022-12-29 – 2022-12-30 (×4): 1000 mg via ORAL
  Filled 2022-12-29 (×4): qty 2

## 2022-12-29 MED ORDER — ONDANSETRON HCL 4 MG/2ML IJ SOLN
INTRAMUSCULAR | Status: DC | PRN
  Administered 2022-12-29: 4 mg via INTRAVENOUS

## 2022-12-29 MED ORDER — INSULIN ASPART 100 UNIT/ML IJ SOLN
0.0000 [IU] | Freq: Three times a day (TID) | INTRAMUSCULAR | Status: DC
  Administered 2022-12-29 – 2022-12-30 (×2): 4 [IU] via SUBCUTANEOUS

## 2022-12-29 SURGICAL SUPPLY — 34 items
ADH SKN CLS APL DERMABOND .7 (GAUZE/BANDAGES/DRESSINGS) ×1
APL PRP STRL LF DISP 70% ISPRP (MISCELLANEOUS) ×1
BAG COUNTER SPONGE SURGICOUNT (BAG) IMPLANT
BAG SPNG CNTER NS LX DISP (BAG)
BINDER ABDOMINAL 12 ML 46-62 (SOFTGOODS) IMPLANT
BLADE SURG 15 STRL LF DISP TIS (BLADE) ×2 IMPLANT
BLADE SURG 15 STRL SS (BLADE) ×1
CHLORAPREP W/TINT 26 (MISCELLANEOUS) ×2 IMPLANT
DERMABOND ADVANCED .7 DNX12 (GAUZE/BANDAGES/DRESSINGS) ×2 IMPLANT
DRAPE LAPAROSCOPIC ABDOMINAL (DRAPES) ×2 IMPLANT
ELECT PENCIL ROCKER SW 15FT (MISCELLANEOUS) IMPLANT
ELECT REM PT RETURN 15FT ADLT (MISCELLANEOUS) ×2 IMPLANT
GAUZE SPONGE 4X4 12PLY STRL (GAUZE/BANDAGES/DRESSINGS) IMPLANT
GLOVE BIO SURGEON STRL SZ7.5 (GLOVE) ×2 IMPLANT
GOWN STRL REUS W/ TWL XL LVL3 (GOWN DISPOSABLE) ×4 IMPLANT
GOWN STRL REUS W/TWL XL LVL3 (GOWN DISPOSABLE) ×2
KIT BASIN OR (CUSTOM PROCEDURE TRAY) ×2 IMPLANT
KIT TURNOVER KIT A (KITS) IMPLANT
MESH VENTRALEX ST 2.5 CRC MED (Mesh General) IMPLANT
NDL HYPO 22X1.5 SAFETY MO (MISCELLANEOUS) ×2 IMPLANT
NEEDLE HYPO 22X1.5 SAFETY MO (MISCELLANEOUS) ×1
PACK BASIC VI WITH GOWN DISP (CUSTOM PROCEDURE TRAY) ×2 IMPLANT
PENCIL SMOKE EVACUATOR (MISCELLANEOUS) IMPLANT
SPIKE FLUID TRANSFER (MISCELLANEOUS) IMPLANT
SUT MNCRL AB 4-0 PS2 18 (SUTURE) ×2 IMPLANT
SUT NOVA NAB DX-16 0-1 5-0 T12 (SUTURE) ×2 IMPLANT
SUT SILK 2 0 (SUTURE) ×1
SUT SILK 2-0 18XBRD TIE 12 (SUTURE) IMPLANT
SUT VIC AB 3-0 SH 27 (SUTURE) ×1
SUT VIC AB 3-0 SH 27X BRD (SUTURE) ×2 IMPLANT
SUT VICRYL 2 0 18 UND BR (SUTURE) IMPLANT
SYR 20ML LL LF (SYRINGE) ×2 IMPLANT
TOWEL OR 17X26 10 PK STRL BLUE (TOWEL DISPOSABLE) ×2 IMPLANT
TOWEL OR NON WOVEN STRL DISP B (DISPOSABLE) ×2 IMPLANT

## 2022-12-29 NOTE — OR Nursing (Signed)
Patient states he was told to bring in a recording of his last few CBG readings.  12/28/22: 99 84 78  12/29/22: 120

## 2022-12-29 NOTE — Op Note (Signed)
   [name] [name] 12/29/2022   Pre-op Diagnosis: INCARCERATED UMBILICAL HERNIA (5 CM FASCIAL DEFECT)     Post-op Diagnosis: INCARCERATED UMBILICAL HERNIA (3 CM FASCIAL DEFECT)  Procedure(s): OPEN REPAIR INCARCERATED UMBILICAL HERNIA REPAIR WITH MESH (6.4 CM ROUND PROLENE ST) 3 CM FASCIAL DEFECT  Surgeon(s): [name], MD  Anesthesia: General  Staff:  Circulator: Lorie Apley, RN Scrub Person: Earnest Conroy  Estimated Blood Loss: Minimal               Findings: Preoperatively it was estimated patient had a 5 cm fascial defect.  At the time of surgery was found to have incarcerated small bowel and omentum in his umbilical hernia but the fascial defect was only 3 cm.  It was repaired with a piece of 6.4 cm round ventral Prolene patch from [facility]  Procedure: The patient was brought to the operating identifies correct patient.  He was placed upon on the operating table and general anesthesia was induced.  His abdomen was prepped and draped in the usual sterile fashion.  He had a large incarcerated umbilical hernia.  There was an extensive mount of redundant skin.  I performed an elliptical incision longitudinally at the umbilicus with a scalpel.  I then took this down to the subcutaneous tissue and excised the overlying skin.  The hernia sac was then entered and found to contain small bowel and omentum.  The small bowel was easily reduced back into the abdominal cavity.  I then excised the hernia sac with the cautery as well as 2-0 silk ties.  I was then able to free of the omentum and placed a back into the abdominal cavity as well.  I then measured the fascial defect which was only 3 cm in size.  I brought a 6.4 cm round ventral Prolene patch onto the field.  It was placed through the fascial opening and then pulled up against the peritoneum with the ties.  I then sutured the mesh in place circumferentially with #1 Novafil sutures.  I then closed the fascia over the top of the  mesh with figure-of-eight #1 Novafil sutures as well.  Wide coverage and easy closure of the fascia was achieved.  I anesthetized the fascia circumferentially with Marcaine.  Hemostasis appeared to be achieved.  I then closed the subcutaneous tissue with interrupted 3-0 Vicryl sutures and closed the skin with a running 4-0 Monocryl.  Dermabond was then applied.  The patient tolerated the procedure well.  All the counts were correct at the end of the procedure.  The patient was then extubated in the operating room and taken in a stable condition to the recovery room.          [name]   Date: 12/29/2022  Time: 8:27 AM

## 2022-12-29 NOTE — Interval H&P Note (Signed)
History and Physical Interval Note:no change in H and P  12/29/2022 6:42 AM  [name] [name]  has presented today for surgery, with the diagnosis of UMBILICAL HERNIA.  The various methods of treatment have been discussed with the patient and family. After consideration of risks, benefits and other options for treatment, the patient has consented to  Procedure(s): OPEN UMBILICAL HERNIA REPAIR WITH MESH (N/A) as a surgical intervention.  The patient's history has been reviewed, patient examined, no change in status, stable for surgery.  I have reviewed the patient's chart and labs.  Questions were answered to the patient's satisfaction.     [name]

## 2022-12-29 NOTE — Plan of Care (Signed)
Problem: Education: Goal: Ability to describe self-care measures that may prevent or decrease complications (Diabetes Survival Skills Education) will improve Outcome: Progressing   Problem: Nutritional: Goal: Maintenance of adequate nutrition will improve Outcome: Progressing

## 2022-12-29 NOTE — Anesthesia Procedure Notes (Deleted)
Anesthesia Regional Block: Interscalene brachial plexus block   Pre-Anesthetic Checklist: , timeout performed,  Correct Patient, Correct Site, Correct Laterality,  Correct Procedure, Correct Position, site marked,  Risks and benefits discussed,  Pre-op evaluation,  At surgeon's request and post-op pain management  Laterality: Right  Prep: Maximum Sterile Barrier Precautions used, chloraprep       Needles:  Injection technique: Single-shot  Needle Type: Echogenic Stimulator Needle     Needle Length: 4cm  Needle Gauge: 22     Additional Needles:   Procedures:,,,, ultrasound used (permanent image in chart),,    Narrative:  Start time: 12/29/2022 7:11 AM End time: 12/29/2022 7:14 AM Injection made incrementally with aspirations every 5 mL.  Performed by: Personally  Anesthesiologist: Kaylyn Layer, MD  Additional Notes: Risks, benefits, and alternative discussed. Patient gave consent for procedure. Patient prepped and draped in sterile fashion. Sedation administered, patient remains easily responsive to voice. Relevant anatomy identified with ultrasound guidance. Local anesthetic given in 5cc increments with no signs or symptoms of intravascular injection. No pain or paraesthesias with injection. Patient monitored throughout procedure with signs of LAST or immediate complications. Tolerated well. Ultrasound image placed in chart.  [name] Greenhouse, MD

## 2022-12-29 NOTE — Transfer of Care (Signed)
Immediate Anesthesia Transfer of Care Note  Patient: [name] [name]  Procedure(s) Performed: OPEN UMBILICAL HERNIA REPAIR WITH MESH  Patient Location: PACU  Anesthesia Type:General  Level of Consciousness: drowsy and patient cooperative  Airway & Oxygen Therapy: Patient Spontanous Breathing and Patient connected to face mask oxygen  Post-op Assessment: Report given to RN and Post -op Vital signs reviewed and stable  Post vital signs: Reviewed and stable  Last Vitals:  Vitals Value Taken Time  BP    Temp    Pulse 66 12/29/22 0829  Resp 18 12/29/22 0829  SpO2 98 % 12/29/22 0829  Vitals shown include unfiled device data.  Last Pain:  Vitals:   12/29/22 0557  TempSrc: Oral         Complications: No notable events documented.

## 2022-12-29 NOTE — Anesthesia Procedure Notes (Signed)
Procedure Name: Intubation Date/Time: 12/29/2022 7:33 AM  Performed by: Adria Dill, CRNAPre-anesthesia Checklist: Patient identified, Emergency Drugs available, Suction available and Patient being monitored Patient Re-evaluated:Patient Re-evaluated prior to induction Oxygen Delivery Method: Circle system utilized Preoxygenation: Pre-oxygenation with 100% oxygen Induction Type: IV induction Ventilation: Mask ventilation without difficulty Laryngoscope Size: Miller and 3 Grade View: Grade I Tube type: Oral Tube size: 7.5 mm Number of attempts: 1 Airway Equipment and Method: Stylet and Oral airway Placement Confirmation: ETT inserted through vocal cords under direct vision, positive ETCO2 and breath sounds checked- equal and bilateral Secured at: 22 cm Tube secured with: Tape Dental Injury: Teeth and Oropharynx as per pre-operative assessment

## 2022-12-29 NOTE — Anesthesia Postprocedure Evaluation (Signed)
Anesthesia Post Note  Patient: [name] [name]  Procedure(s) Performed: OPEN UMBILICAL HERNIA REPAIR WITH MESH     Patient location during evaluation: PACU Anesthesia Type: General Level of consciousness: awake and alert Pain management: pain level controlled Vital Signs Assessment: post-procedure vital signs reviewed and stable Respiratory status: spontaneous breathing, nonlabored ventilation and respiratory function stable Cardiovascular status: blood pressure returned to baseline Postop Assessment: no apparent nausea or vomiting Anesthetic complications: no   No notable events documented.  Last Vitals:  Vitals:   12/29/22 0915 12/29/22 0930  BP: (!) 151/72 (!) 162/60  Pulse: 63 64  Resp: 13 17  Temp:  36.5 C  SpO2: 98% 98%    Last Pain:  Vitals:   12/29/22 0930  TempSrc:   PainSc: 3                  Shanda Howells

## 2022-12-30 ENCOUNTER — Encounter (HOSPITAL_COMMUNITY): Payer: Self-pay | Admitting: Surgery

## 2022-12-30 DIAGNOSIS — K420 Umbilical hernia with obstruction, without gangrene: Secondary | ICD-10-CM | POA: Diagnosis not present

## 2022-12-30 DIAGNOSIS — I4891 Unspecified atrial fibrillation: Secondary | ICD-10-CM | POA: Diagnosis not present

## 2022-12-30 DIAGNOSIS — E119 Type 2 diabetes mellitus without complications: Secondary | ICD-10-CM | POA: Diagnosis not present

## 2022-12-30 DIAGNOSIS — I10 Essential (primary) hypertension: Secondary | ICD-10-CM | POA: Diagnosis not present

## 2022-12-30 DIAGNOSIS — Z7901 Long term (current) use of anticoagulants: Secondary | ICD-10-CM | POA: Diagnosis not present

## 2022-12-30 DIAGNOSIS — Z794 Long term (current) use of insulin: Secondary | ICD-10-CM | POA: Diagnosis not present

## 2022-12-30 LAB — CBC
HCT: 38.6 % — ABNORMAL LOW (ref 39.0–52.0)
Hemoglobin: 12.3 g/dL — ABNORMAL LOW (ref 13.0–17.0)
MCH: 28.7 pg (ref 26.0–34.0)
MCHC: 31.9 g/dL (ref 30.0–36.0)
MCV: 90.2 fL (ref 80.0–100.0)
Platelets: 131 10*3/uL — ABNORMAL LOW (ref 150–400)
RBC: 4.28 MIL/uL (ref 4.22–5.81)
RDW: 15.1 % (ref 11.5–15.5)
WBC: 10.3 10*3/uL (ref 4.0–10.5)
nRBC: 0 % (ref 0.0–0.2)

## 2022-12-30 LAB — BASIC METABOLIC PANEL
Anion gap: 7 (ref 5–15)
BUN: 22 mg/dL (ref 8–23)
CO2: 26 mmol/L (ref 22–32)
Calcium: 9 mg/dL (ref 8.9–10.3)
Chloride: 105 mmol/L (ref 98–111)
Creatinine, Ser: 0.8 mg/dL (ref 0.61–1.24)
GFR, Estimated: >60 mL/min (ref >60)
Glucose, Bld: 186 mg/dL — ABNORMAL HIGH (ref 70–99)
Potassium: 4.2 mmol/L (ref 3.5–5.1)
Sodium: 138 mmol/L (ref 135–145)

## 2022-12-30 LAB — GLUCOSE, CAPILLARY: Glucose-Capillary: 163 mg/dL — ABNORMAL HIGH (ref 70–99)

## 2022-12-30 MED ORDER — TRAMADOL HCL 50 MG PO TABS: 50.0000 mg | ORAL_TABLET | Freq: Four times a day (QID) | ORAL | 0 refills | Status: AC | PRN

## 2022-12-30 MED ORDER — ALUM & MAG HYDROXIDE-SIMETH 200-200-20 MG/5ML PO SUSP
30.0000 mL | Freq: Four times a day (QID) | ORAL | Status: DC | PRN
  Administered 2022-12-30: 30 mL via ORAL
  Filled 2022-12-30: qty 30

## 2022-12-30 NOTE — Care Management Important Message (Signed)
Important Message  Patient Details IM Letter given. Name: [name] [name] MRN: [id number] Date of Birth: May 23, 1934   Important Message Given:  Yes - Medicare IM     [name] 12/30/2022, 9:30 AM

## 2022-12-30 NOTE — Progress Notes (Signed)
Patient ID: [name] [name], male   DOB: Nov 19, 1934, 88 y.o.   MRN: [id number]   Doing well Plan d/c home

## 2022-12-30 NOTE — Discharge Instructions (Signed)
[facility] _______[facility]  UMBILICAL OR INGUINAL HERNIA REPAIR: POST OP INSTRUCTIONS  Always review your discharge instruction sheet given to you by the facility where your surgery was performed. IF YOU HAVE DISABILITY OR FAMILY LEAVE FORMS, YOU MUST BRING THEM TO THE OFFICE FOR PROCESSING.   DO NOT GIVE THEM TO YOUR DOCTOR.  1. A  prescription for pain medication may be given to you upon discharge.  Take your pain medication as prescribed, if needed.  If narcotic pain medicine is not needed, then you may take acetaminophen (Tylenol) or ibuprofen (Advil) as needed. 2. Take your usually prescribed medications unless otherwise directed. If you need a refill on your pain medication, please contact your pharmacy.  They will contact our office to request authorization. Prescriptions will not be filled after 5 pm or on week-ends. 3. You should follow a light diet the first 24 hours after arrival home, such as soup and crackers, etc.  Be sure to include lots of fluids daily.  Resume your normal diet the day after surgery. 4.Most patients will experience some swelling and bruising around the umbilicus or in the groin and scrotum.  Ice packs and reclining will help.  Swelling and bruising can take several days to resolve.  6. It is common to experience some constipation if taking pain medication after surgery.  Increasing fluid intake and taking a stool softener (such as Colace) will usually help or prevent this problem from occurring.  A mild laxative (Milk of Magnesia or Miralax) should be taken according to package directions if there are no bowel movements after 48 hours. 7. Unless discharge instructions indicate otherwise, you may remove your bandages 24-48 hours after surgery, and you may shower at that time.  You may have steri-strips (small skin tapes) in place directly over the incision.  These strips should be left on the skin for 7-10 days.  If your surgeon used skin glue on the  incision, you may shower in 24 hours.  The glue will flake off over the next 2-3 weeks.  Any sutures or staples will be removed at the office during your follow-up visit. 8. ACTIVITIES:  You may resume regular (light) daily activities beginning the next day--such as daily self-care, walking, climbing stairs--gradually increasing activities as tolerated.  You may have sexual intercourse when it is comfortable.  Refrain from any heavy lifting or straining until approved by your doctor.  a.You may drive when you are no longer taking prescription pain medication, you can comfortably wear a seatbelt, and you can safely maneuver your car and apply brakes. b.RETURN TO WORK:   _____________________________________________  9.You should see your doctor in the office for a follow-up appointment approximately 2-3 weeks after your surgery.  Make sure that you call for this appointment within a day or two after you arrive home to insure a convenient appointment time. 10.OTHER INSTRUCTIONS: _YOU MAY SHOWER STARTING TODAY NO LIFTING MORE THAN 15 POUNDS FOR 4 TO 6 WEEKS________________________    _____________________________________  WHEN TO CALL YOUR DOCTOR: Fever over 101.0 Inability to urinate Nausea and/or vomiting Extreme swelling or bruising Continued bleeding from incision. Increased pain, redness, or drainage from the incision  The clinic staff is available to answer your questions during regular business hours.  Please don't hesitate to call and ask to speak to one of the nurses for clinical concerns.  If you have a medical emergency, go to the nearest emergency room or call 911.  A surgeon from [facility]  is always on call at the hospital   [address], [place], [place]  [address] ?  [address] [address], [place], [place]   [address] [phone] ? [phone] ? FAX [phone] Web site: [email or web address]

## 2022-12-30 NOTE — Plan of Care (Signed)
Problem: Fluid Volume: Goal: Ability to maintain a balanced intake and output will improve Outcome: Progressing   Problem: Education: Goal: Knowledge of General Education information will improve Description: Including pain rating scale, medication(s)/side effects and non-pharmacologic comfort measures Outcome: Progressing

## 2022-12-30 NOTE — Discharge Summary (Signed)
Physician Discharge Summary  Patient ID: [name] [name] MRN: [id number] DOB/AGE: November 08, 1934 88 y.o.  Admit date: 12/29/2022 Discharge date: 12/30/2022  Admission Diagnoses:  Discharge Diagnoses:  Principal Problem:   Incarcerated umbilical hernia   Discharged Condition: good  Hospital Course: Uneventful post op recovery.  Discharged home POD#1  Consults: None  Significant Diagnostic Studies:   Treatments: open repair incarcerated umbilical hernia with mesh  Discharge Exam: Blood pressure (!) 146/54, pulse (!) 54, temperature 97.6 F (36.4 C), temperature source Oral, resp. rate 18, height 6' (1.829 m), weight 117.9 kg, SpO2 95%. Awake and alert Abdomen soft, incision clean, non-tender  Disposition: Discharge disposition: 01-Home or Self Care        Allergies as of 12/30/2022       Reactions   Penicillins Hives, Other (See Comments)   Has tolerated Rocephin   Erythromycin Rash        Medication List     TAKE these medications    Accu-Chek SmartView test strip Generic drug: glucose blood USE 1 STRIP TO CHECK GLUCOSE THREE TIMES DAILY FOR FLUCTUATING BLOOD SUGAR   acetaminophen 500 MG tablet Commonly known as: TYLENOL Take 1,000 mg by mouth every 6 (six) hours as needed for moderate pain.   amLODipine 10 MG tablet Commonly known as: NORVASC Take 10 mg by mouth daily.   atorvastatin 10 MG tablet Commonly known as: LIPITOR Take 10 mg by mouth every evening.   Calcium Carb-Cholecalciferol 600-800 MG-UNIT Tabs Take 2 tablets by mouth daily with breakfast.   cholecalciferol 1000 units tablet Commonly known as: VITAMIN D Take 1,000 Units by mouth every morning.   ciclopirox 0.77 % cream Commonly known as: LOPROX Apply 1 application  topically daily as needed (fungus). APPLY TO THE FEET   Co Q-10 100 MG Caps Take 100 mg by mouth daily with breakfast.   digoxin 0.125 MG tablet Commonly known as: LANOXIN Take 0.125 mg by mouth daily with  breakfast.   finasteride 5 MG tablet Commonly known as: PROSCAR Take 1 tablet (5 mg total) by mouth every morning.   Fish Oil 1200 MG Caps Take 2 capsules by mouth 2 (two) times daily.   furosemide 40 MG tablet Commonly known as: LASIX Take 40 mg by mouth daily.   HumaLOG KwikPen 100 UNIT/ML KwikPen Generic drug: insulin lispro Inject 4-10 Units into the skin 3 (three) times daily before meals.   insulin detemir 100 UNIT/ML FlexPen Commonly known as: Levemir FlexTouch Inject 20 Units into the skin daily. What changed: how much to take   lisinopril 40 MG tablet Commonly known as: ZESTRIL Take 40 mg by mouth daily.   LORazepam 1 MG tablet Commonly known as: ATIVAN Take 1 mg by mouth at bedtime.   multivitamin with minerals Tabs tablet Take 1 tablet by mouth daily with breakfast.   Ozempic (1 MG/DOSE) 2 MG/1.5ML Sopn Generic drug: Semaglutide (1 MG/DOSE) Inject 1 mg into the skin every Saturday.   polyethylene glycol 17 g packet Commonly known as: MIRALAX / GLYCOLAX Take 17 g by mouth 2 (two) times daily.   potassium chloride SA 20 MEQ tablet Commonly known as: KLOR-CON M Take 40 mEq by mouth daily.   tamsulosin 0.4 MG Caps capsule Commonly known as: FLOMAX Take 1 capsule (0.4 mg total) by mouth daily after supper.   traMADol 50 MG tablet Commonly known as: Ultram Take 1 tablet (50 mg total) by mouth every 6 (six) hours as needed for moderate pain or severe pain.  triamcinolone cream 0.1 % Commonly known as: KENALOG Apply 1 Application topically 2 (two) times daily as needed (rash).   vitamin C 1000 MG tablet Take 1,000 mg by mouth daily with breakfast.   vitamin E 180 MG (400 UNITS) capsule Take 400 Units by mouth every morning.   warfarin 5 MG tablet Commonly known as: COUMADIN Take as directed. If you are unsure how to take this medication, talk to your nurse or doctor. Original instructions: TAKE 1 & 1/2 (ONE & ONE-HALF) TABLETS BY MOUTH ONCE DAILY  EXCEPT 1 TABLET ON MONDAYS, WEDNESDAYS AND FRIDAYS OR  AS  DIRECTED  BY  ANTICOAGULATION  CLINIC What changed: additional instructions        Follow-up Information     [name], MD. Schedule an appointment as soon as possible for a visit in 4 week(s).   Specialty: General Surgery Contact information: [address] [place] [address] [phone]                 Signed: [name] 12/30/2022, 6:58 AM

## 2022-12-30 NOTE — Plan of Care (Signed)

## 2023-01-07 ENCOUNTER — Ambulatory Visit: Payer: Medicare Other | Attending: Cardiology | Admitting: *Deleted

## 2023-01-07 DIAGNOSIS — I4821 Permanent atrial fibrillation: Secondary | ICD-10-CM | POA: Insufficient documentation

## 2023-01-07 DIAGNOSIS — Z5181 Encounter for therapeutic drug level monitoring: Secondary | ICD-10-CM | POA: Diagnosis not present

## 2023-01-07 LAB — POCT INR: INR: 1.6 — AB (ref 2.0–3.0)

## 2023-01-07 NOTE — Patient Instructions (Signed)
Take warfarin 2 tablets tonight, take 1 1/2 tomorrow night then resume 1 tablet daily except 1 1/2 tablets on Sundays and Thursdays Recheck in 1 wk Was off warfarin 6 days for surgery.

## 2023-01-13 ENCOUNTER — Ambulatory Visit: Payer: Medicare Other | Attending: Cardiology | Admitting: *Deleted

## 2023-01-13 DIAGNOSIS — Z5181 Encounter for therapeutic drug level monitoring: Secondary | ICD-10-CM | POA: Insufficient documentation

## 2023-01-13 DIAGNOSIS — I4821 Permanent atrial fibrillation: Secondary | ICD-10-CM | POA: Diagnosis not present

## 2023-01-13 LAB — POCT INR: INR: 2.4 (ref 2.0–3.0)

## 2023-01-13 NOTE — Patient Instructions (Signed)
Continue warfarin 1 tablet daily except 1 1/2 tablets on Sundays and Thursdays Recheck in 4 wks

## 2023-01-20 DIAGNOSIS — Z7189 Other specified counseling: Secondary | ICD-10-CM | POA: Diagnosis not present

## 2023-01-20 DIAGNOSIS — Z23 Encounter for immunization: Secondary | ICD-10-CM | POA: Diagnosis not present

## 2023-01-20 DIAGNOSIS — Z0000 Encounter for general adult medical examination without abnormal findings: Secondary | ICD-10-CM | POA: Diagnosis not present

## 2023-01-20 DIAGNOSIS — Z299 Encounter for prophylactic measures, unspecified: Secondary | ICD-10-CM | POA: Diagnosis not present

## 2023-01-20 DIAGNOSIS — Z79899 Other long term (current) drug therapy: Secondary | ICD-10-CM | POA: Diagnosis not present

## 2023-01-20 DIAGNOSIS — Z1339 Encounter for screening examination for other mental health and behavioral disorders: Secondary | ICD-10-CM | POA: Diagnosis not present

## 2023-01-20 DIAGNOSIS — Z1331 Encounter for screening for depression: Secondary | ICD-10-CM | POA: Diagnosis not present

## 2023-01-20 DIAGNOSIS — R5383 Other fatigue: Secondary | ICD-10-CM | POA: Diagnosis not present

## 2023-01-20 DIAGNOSIS — I10 Essential (primary) hypertension: Secondary | ICD-10-CM | POA: Diagnosis not present

## 2023-01-20 DIAGNOSIS — E7800 Pure hypercholesterolemia, unspecified: Secondary | ICD-10-CM | POA: Diagnosis not present

## 2023-01-21 DIAGNOSIS — Z125 Encounter for screening for malignant neoplasm of prostate: Secondary | ICD-10-CM | POA: Diagnosis not present

## 2023-01-21 DIAGNOSIS — R5383 Other fatigue: Secondary | ICD-10-CM | POA: Diagnosis not present

## 2023-01-21 DIAGNOSIS — Z79899 Other long term (current) drug therapy: Secondary | ICD-10-CM | POA: Diagnosis not present

## 2023-01-21 DIAGNOSIS — E7800 Pure hypercholesterolemia, unspecified: Secondary | ICD-10-CM | POA: Diagnosis not present

## 2023-01-25 DIAGNOSIS — K420 Umbilical hernia with obstruction, without gangrene: Secondary | ICD-10-CM | POA: Diagnosis not present

## 2023-01-25 DIAGNOSIS — Z09 Encounter for follow-up examination after completed treatment for conditions other than malignant neoplasm: Secondary | ICD-10-CM | POA: Diagnosis not present

## 2023-02-10 ENCOUNTER — Ambulatory Visit: Payer: Medicare Other | Attending: Cardiology | Admitting: *Deleted

## 2023-02-10 DIAGNOSIS — Z5181 Encounter for therapeutic drug level monitoring: Secondary | ICD-10-CM | POA: Diagnosis not present

## 2023-02-10 DIAGNOSIS — I4821 Permanent atrial fibrillation: Secondary | ICD-10-CM | POA: Diagnosis not present

## 2023-02-10 LAB — POCT INR: INR: 1.9 — AB (ref 2.0–3.0)

## 2023-02-10 NOTE — Patient Instructions (Signed)
Take warfarin 1 1/2 tablets tonight then increase dose to 1 tablet daily except 1 1/2 tablets on Sundays, Tuesdays and Thursdays Recheck in 3 wks

## 2023-02-15 ENCOUNTER — Other Ambulatory Visit: Payer: Self-pay | Admitting: Cardiology

## 2023-02-15 NOTE — Telephone Encounter (Signed)
Refill request for warfarin:  Last INR was 1.9 on 02/10/23 Next INR due 03/08/23 LOV was 12/15/22  Refill approved.

## 2023-02-16 DIAGNOSIS — E1169 Type 2 diabetes mellitus with other specified complication: Secondary | ICD-10-CM | POA: Diagnosis not present

## 2023-02-16 DIAGNOSIS — Z299 Encounter for prophylactic measures, unspecified: Secondary | ICD-10-CM | POA: Diagnosis not present

## 2023-02-16 DIAGNOSIS — I4891 Unspecified atrial fibrillation: Secondary | ICD-10-CM | POA: Diagnosis not present

## 2023-02-16 DIAGNOSIS — I10 Essential (primary) hypertension: Secondary | ICD-10-CM | POA: Diagnosis not present

## 2023-02-16 DIAGNOSIS — Z6835 Body mass index (BMI) 35.0-35.9, adult: Secondary | ICD-10-CM | POA: Diagnosis not present

## 2023-03-08 ENCOUNTER — Ambulatory Visit: Payer: Medicare Other | Attending: Cardiology | Admitting: *Deleted

## 2023-03-08 DIAGNOSIS — Z5181 Encounter for therapeutic drug level monitoring: Secondary | ICD-10-CM

## 2023-03-08 DIAGNOSIS — I4821 Permanent atrial fibrillation: Secondary | ICD-10-CM | POA: Diagnosis not present

## 2023-03-08 LAB — POCT INR: INR: 1.7 — AB (ref 2.0–3.0)

## 2023-03-08 MED ORDER — WARFARIN SODIUM 5 MG PO TABS: ORAL_TABLET | ORAL | 3 refills | Status: DC

## 2023-03-08 NOTE — Patient Instructions (Signed)
Take warfarin 2 tablets tonight then increase dose to 1 1/2 tablets daily except 1 tablet on Mondays, Wednesdays and Fridays Recheck in 2 wks

## 2023-03-11 DIAGNOSIS — B351 Tinea unguium: Secondary | ICD-10-CM | POA: Diagnosis not present

## 2023-03-11 DIAGNOSIS — E1142 Type 2 diabetes mellitus with diabetic polyneuropathy: Secondary | ICD-10-CM | POA: Diagnosis not present

## 2023-03-11 DIAGNOSIS — L84 Corns and callosities: Secondary | ICD-10-CM | POA: Diagnosis not present

## 2023-03-22 ENCOUNTER — Ambulatory Visit: Payer: Medicare Other | Attending: Cardiology | Admitting: *Deleted

## 2023-03-22 DIAGNOSIS — I4821 Permanent atrial fibrillation: Secondary | ICD-10-CM | POA: Diagnosis not present

## 2023-03-22 DIAGNOSIS — Z5181 Encounter for therapeutic drug level monitoring: Secondary | ICD-10-CM | POA: Diagnosis not present

## 2023-03-22 LAB — POCT INR: INR: 2.5 (ref 2.0–3.0)

## 2023-03-22 NOTE — Patient Instructions (Signed)
Continue warfarin 1 1/2 tablets daily except 1 tablet on Mondays, Wednesdays and Fridays Recheck in 4 wks  

## 2023-03-24 ENCOUNTER — Ambulatory Visit: Payer: Medicare Other | Admitting: Cardiology

## 2023-04-12 ENCOUNTER — Other Ambulatory Visit: Payer: Self-pay | Admitting: *Deleted

## 2023-04-12 DIAGNOSIS — E1169 Type 2 diabetes mellitus with other specified complication: Secondary | ICD-10-CM | POA: Diagnosis not present

## 2023-04-12 DIAGNOSIS — I10 Essential (primary) hypertension: Secondary | ICD-10-CM | POA: Diagnosis not present

## 2023-04-12 DIAGNOSIS — Z299 Encounter for prophylactic measures, unspecified: Secondary | ICD-10-CM | POA: Diagnosis not present

## 2023-04-12 MED ORDER — WARFARIN SODIUM 5 MG PO TABS: ORAL_TABLET | ORAL | 2 refills | Status: DC

## 2023-04-12 NOTE — Telephone Encounter (Signed)
 Refill request for warfarin:  Last INR was 2.5 on 03/22/23 Next INR due 04/19/23 LOV was 12/15/22  Refill approved.

## 2023-04-15 ENCOUNTER — Telehealth: Payer: Self-pay | Admitting: Cardiology

## 2023-04-15 MED ORDER — WARFARIN SODIUM 5 MG PO TABS: ORAL_TABLET | ORAL | 2 refills | Status: DC

## 2023-04-15 NOTE — Telephone Encounter (Signed)
 Pt c/o medication issue:  1. Name of Medication:   warfarin (COUMADIN ) 5 MG tablet    2. How are you currently taking this medication (dosage and times per day)? As written   3. Are you having a reaction (difficulty breathing--STAT)? No   4. What is your medication issue? [facility] called in to clarify the dosage for pt's medication. She states Reference #: [id number]

## 2023-04-15 NOTE — Telephone Encounter (Signed)
 Refill request for warfarin:  Last INR was 2.5 on 03/22/23 Next INR due 04/18/22 LOV was 12/15/22  Refill approved.

## 2023-04-19 ENCOUNTER — Ambulatory Visit: Payer: Medicare Other | Attending: Cardiology | Admitting: *Deleted

## 2023-04-19 DIAGNOSIS — I4821 Permanent atrial fibrillation: Secondary | ICD-10-CM | POA: Diagnosis not present

## 2023-04-19 DIAGNOSIS — Z5181 Encounter for therapeutic drug level monitoring: Secondary | ICD-10-CM | POA: Diagnosis not present

## 2023-04-19 LAB — POCT INR: INR: 1.9 — AB (ref 2.0–3.0)

## 2023-04-19 MED ORDER — WARFARIN SODIUM 5 MG PO TABS: ORAL_TABLET | ORAL | 2 refills | Status: DC

## 2023-04-19 NOTE — Patient Instructions (Signed)
 Take warfarin 1 1/2 tablets tonight then resume 1 1/2 tablets daily except 1 tablet on Mondays, Wednesdays and Fridays Recheck in 3 wks

## 2023-05-03 ENCOUNTER — Ambulatory Visit: Payer: Medicare Other | Attending: Cardiology | Admitting: Cardiology

## 2023-05-03 ENCOUNTER — Encounter: Payer: Self-pay | Admitting: Cardiology

## 2023-05-03 VITALS — BP 138/64 | HR 51 | Ht 72.0 in | Wt 244.6 lb

## 2023-05-03 DIAGNOSIS — I10 Essential (primary) hypertension: Secondary | ICD-10-CM | POA: Diagnosis not present

## 2023-05-03 DIAGNOSIS — I350 Nonrheumatic aortic (valve) stenosis: Secondary | ICD-10-CM | POA: Diagnosis not present

## 2023-05-03 DIAGNOSIS — I4821 Permanent atrial fibrillation: Secondary | ICD-10-CM | POA: Insufficient documentation

## 2023-05-03 DIAGNOSIS — E782 Mixed hyperlipidemia: Secondary | ICD-10-CM | POA: Diagnosis not present

## 2023-05-03 NOTE — Patient Instructions (Addendum)
Medication Instructions:  Your physician recommends that you continue on your current medications as directed. Please refer to the Current Medication list given to you today.  Labwork: none  Testing/Procedures: Your physician has requested that you have an echocardiogram in 6 months. Echocardiography is a painless test that uses sound waves to create images of your heart. It provides your doctor with information about the size and shape of your heart and how well your heart's chambers and valves are working. This procedure takes approximately one hour. There are no restrictions for this procedure. Please do NOT wear cologne, perfume, aftershave, or lotions (deodorant is allowed). Please arrive 15 minutes prior to your appointment time.  Please note: We ask at that you not bring children with you during ultrasound (echo/ vascular) testing. Due to room size and safety concerns, children are not allowed in the ultrasound rooms during exams. Our front office staff cannot provide observation of children in our lobby area while testing is being conducted. An adult accompanying a patient to their appointment will only be allowed in the ultrasound room at the discretion of the ultrasound technician under special circumstances. We apologize for any inconvenience.  Follow-Up: Your physician recommends that you schedule a follow-up appointment in: 6 months  Any Other Special Instructions Will Be Listed Below (If Applicable).  If you need a refill on your cardiac medications before your next appointment, please call your pharmacy.

## 2023-05-03 NOTE — Progress Notes (Signed)
    Cardiology Office Note  Date: 05/03/2023   ID: [name] [name], DOB Dec 19, 1934, MRN [id number]  History of Present Illness: [name] [name] is an 89 y.o. male last seen in June 2024.  He is here for a follow-up visit.  He does not report any sense of palpitations, no exertional chest pain.  He uses a walker to ambulate, does not describe any recent falls.  Chart review finds umbilical hernia repair with Dr. Magnus Ivan back in September 2024.  I reviewed his medications.  Current regimen includes Norvasc, Lipitor, Lanoxin, Lasix, lisinopril, omega-3 supplements, Ozempic, potassium supplement, and Coumadin.  He follows in the anticoagulation clinic.  He does not report any spontaneous bleeding problems.  We discussed getting a follow-up echocardiogram around the time of his next visit.  Physical Exam: VS:  BP 138/64   Pulse (!) 51   Ht 6' (1.829 m)   Wt 244 lb 9.6 oz (110.9 kg)   SpO2 98%   BMI 33.17 kg/m , BMI Body mass index is 33.17 kg/m.  Wt Readings from Last 3 Encounters:  05/03/23 244 lb 9.6 oz (110.9 kg)  12/29/22 260 lb (117.9 kg)  12/22/22 260 lb (117.9 kg)    General: Patient appears comfortable at rest. HEENT: Conjunctiva and lids normal. Neck: Supple, no elevated JVP or carotid bruits. Lungs: Clear to auscultation, nonlabored breathing at rest. Cardiac: Irregularly irregular, 3/6 systolic murmur, no gallop. Extremities: Stable lower leg edema.  ECG:  An ECG dated 10/23/2022 was personally reviewed today and demonstrated:  Atrial fibrillation with right bundle branch block, left anterior fascicular block, PVC.  Labwork: 10/23/2022: ALT 33; AST 30 12/30/2022: BUN 22; Creatinine, Ser 0.80; Hemoglobin 12.3; Platelets 131; Potassium 4.2; Sodium 138  October 2024: Hemoglobin 14, platelets 164, BUN 24, creatinine 1.03, potassium 3.9, AST 21, ALT 15, cholesterol 160, triglycerides 90, HDL 47, LDL 96, TSH 2.14  Other Studies Reviewed Today:  No interval cardiac testing for  review today.  Assessment and Plan:  1.  Permanent atrial fibrillation with CHA2DS2-VASc score of 5.  He remains asymptomatic and continues with strategy of heart rate control and anticoagulation, currently on Lanoxin and Coumadin.  Keep follow-up in the anticoagulation clinic.   2.  Degenerative calcific aortic stenosis, mild by echocardiogram in November 2023, mean AV gradient approximately 18 mmHg at that time.  Also has mild aortic regurgitation.  Plan to update echocardiogram for next visit in 6 months.   3.  Primary hypertension.  No change made in current regimen which includes lisinopril and Norvasc.   4.  Mixed hyperlipidemia.  LDL 96 in October 2024.  He continues on Lipitor.  Disposition:  Follow up  6 months.  Signed, Jonelle Sidle, M.D., F.A.C.C. [facility]

## 2023-05-10 ENCOUNTER — Ambulatory Visit: Payer: Medicare Other | Attending: Cardiology | Admitting: *Deleted

## 2023-05-10 DIAGNOSIS — Z5181 Encounter for therapeutic drug level monitoring: Secondary | ICD-10-CM | POA: Insufficient documentation

## 2023-05-10 DIAGNOSIS — I4821 Permanent atrial fibrillation: Secondary | ICD-10-CM | POA: Insufficient documentation

## 2023-05-10 LAB — POCT INR: INR: 2.4 (ref 2.0–3.0)

## 2023-05-10 NOTE — Patient Instructions (Signed)
Continue warfarin 1 1/2 tablets daily except 1 tablet on Mondays, Wednesdays and Fridays Recheck in 4 wks  

## 2023-05-20 DIAGNOSIS — B351 Tinea unguium: Secondary | ICD-10-CM | POA: Diagnosis not present

## 2023-05-20 DIAGNOSIS — L84 Corns and callosities: Secondary | ICD-10-CM | POA: Diagnosis not present

## 2023-05-20 DIAGNOSIS — E1142 Type 2 diabetes mellitus with diabetic polyneuropathy: Secondary | ICD-10-CM | POA: Diagnosis not present

## 2023-06-01 ENCOUNTER — Encounter: Payer: Medicare Other | Attending: Physician Assistant | Admitting: Physician Assistant

## 2023-06-01 DIAGNOSIS — E11622 Type 2 diabetes mellitus with other skin ulcer: Secondary | ICD-10-CM | POA: Insufficient documentation

## 2023-06-01 DIAGNOSIS — I10 Essential (primary) hypertension: Secondary | ICD-10-CM | POA: Insufficient documentation

## 2023-06-01 DIAGNOSIS — L98492 Non-pressure chronic ulcer of skin of other sites with fat layer exposed: Secondary | ICD-10-CM | POA: Insufficient documentation

## 2023-06-01 DIAGNOSIS — I480 Paroxysmal atrial fibrillation: Secondary | ICD-10-CM | POA: Diagnosis not present

## 2023-06-01 DIAGNOSIS — S31114A Laceration without foreign body of abdominal wall, left lower quadrant without penetration into peritoneal cavity, initial encounter: Secondary | ICD-10-CM | POA: Diagnosis not present

## 2023-06-01 DIAGNOSIS — X58XXXA Exposure to other specified factors, initial encounter: Secondary | ICD-10-CM | POA: Diagnosis not present

## 2023-06-07 ENCOUNTER — Ambulatory Visit: Payer: Medicare Other | Attending: Cardiology | Admitting: *Deleted

## 2023-06-07 DIAGNOSIS — Z5181 Encounter for therapeutic drug level monitoring: Secondary | ICD-10-CM | POA: Diagnosis not present

## 2023-06-07 DIAGNOSIS — I4821 Permanent atrial fibrillation: Secondary | ICD-10-CM | POA: Diagnosis not present

## 2023-06-07 LAB — POCT INR: INR: 1.9 — AB (ref 2.0–3.0)

## 2023-06-07 NOTE — Patient Instructions (Signed)
 Take warfarin 1 1/2 tablets tonight then resume 1 1/2 tablets daily except 1 tablet on Mondays, Wednesdays and Fridays Recheck in 4 wks

## 2023-06-15 ENCOUNTER — Encounter: Payer: Medicare Other | Attending: Physician Assistant | Admitting: Physician Assistant

## 2023-06-15 DIAGNOSIS — L98492 Non-pressure chronic ulcer of skin of other sites with fat layer exposed: Secondary | ICD-10-CM | POA: Insufficient documentation

## 2023-06-15 DIAGNOSIS — E11622 Type 2 diabetes mellitus with other skin ulcer: Secondary | ICD-10-CM | POA: Insufficient documentation

## 2023-06-15 DIAGNOSIS — S31114A Laceration without foreign body of abdominal wall, left lower quadrant without penetration into peritoneal cavity, initial encounter: Secondary | ICD-10-CM | POA: Diagnosis not present

## 2023-06-15 DIAGNOSIS — I480 Paroxysmal atrial fibrillation: Secondary | ICD-10-CM | POA: Insufficient documentation

## 2023-06-15 DIAGNOSIS — I10 Essential (primary) hypertension: Secondary | ICD-10-CM | POA: Insufficient documentation

## 2023-06-23 ENCOUNTER — Ambulatory Visit: Admitting: Physician Assistant

## 2023-07-05 ENCOUNTER — Ambulatory Visit: Attending: Cardiology | Admitting: *Deleted

## 2023-07-05 DIAGNOSIS — Z5181 Encounter for therapeutic drug level monitoring: Secondary | ICD-10-CM | POA: Diagnosis not present

## 2023-07-05 DIAGNOSIS — I4821 Permanent atrial fibrillation: Secondary | ICD-10-CM | POA: Insufficient documentation

## 2023-07-05 LAB — POCT INR: INR: 1.9 — AB (ref 2.0–3.0)

## 2023-07-05 NOTE — Patient Instructions (Signed)
 Take warfarin 1 1/2 tablets tonight then increase dose to 1 1/2 tablets daily except 1 tablet on Mondays and Fridays Recheck in 3 wks

## 2023-07-14 DIAGNOSIS — E1165 Type 2 diabetes mellitus with hyperglycemia: Secondary | ICD-10-CM | POA: Diagnosis not present

## 2023-07-14 DIAGNOSIS — I10 Essential (primary) hypertension: Secondary | ICD-10-CM | POA: Diagnosis not present

## 2023-07-14 DIAGNOSIS — I4891 Unspecified atrial fibrillation: Secondary | ICD-10-CM | POA: Diagnosis not present

## 2023-07-14 DIAGNOSIS — Z299 Encounter for prophylactic measures, unspecified: Secondary | ICD-10-CM | POA: Diagnosis not present

## 2023-07-14 DIAGNOSIS — C9111 Chronic lymphocytic leukemia of B-cell type in remission: Secondary | ICD-10-CM | POA: Diagnosis not present

## 2023-07-26 ENCOUNTER — Ambulatory Visit: Attending: Cardiology | Admitting: *Deleted

## 2023-07-26 DIAGNOSIS — I4821 Permanent atrial fibrillation: Secondary | ICD-10-CM | POA: Insufficient documentation

## 2023-07-26 DIAGNOSIS — Z5181 Encounter for therapeutic drug level monitoring: Secondary | ICD-10-CM | POA: Diagnosis not present

## 2023-07-26 LAB — POCT INR: INR: 2.1 (ref 2.0–3.0)

## 2023-07-26 NOTE — Patient Instructions (Signed)
 Continue warfarin 1 1/2 tablets daily except 1 tablet on Mondays and Fridays Recheck in 4 wks

## 2023-07-29 DIAGNOSIS — B351 Tinea unguium: Secondary | ICD-10-CM | POA: Diagnosis not present

## 2023-07-29 DIAGNOSIS — E1142 Type 2 diabetes mellitus with diabetic polyneuropathy: Secondary | ICD-10-CM | POA: Diagnosis not present

## 2023-07-29 DIAGNOSIS — L84 Corns and callosities: Secondary | ICD-10-CM | POA: Diagnosis not present

## 2023-08-23 ENCOUNTER — Ambulatory Visit: Attending: Cardiology | Admitting: *Deleted

## 2023-08-23 DIAGNOSIS — Z5181 Encounter for therapeutic drug level monitoring: Secondary | ICD-10-CM

## 2023-08-23 DIAGNOSIS — I4821 Permanent atrial fibrillation: Secondary | ICD-10-CM | POA: Diagnosis not present

## 2023-08-23 LAB — POCT INR: INR: 1.9 — AB (ref 2.0–3.0)

## 2023-08-23 NOTE — Patient Instructions (Signed)
 Increase warfarin 1 1/2 tablets daily except 1 tablet on Fridays Recheck in 5 wks

## 2023-08-31 ENCOUNTER — Ambulatory Visit (INDEPENDENT_AMBULATORY_CARE_PROVIDER_SITE_OTHER): Payer: Medicare Other | Admitting: Otolaryngology

## 2023-08-31 ENCOUNTER — Encounter (INDEPENDENT_AMBULATORY_CARE_PROVIDER_SITE_OTHER): Payer: Self-pay | Admitting: Otolaryngology

## 2023-08-31 ENCOUNTER — Ambulatory Visit (INDEPENDENT_AMBULATORY_CARE_PROVIDER_SITE_OTHER): Payer: Medicare Other | Admitting: Audiology

## 2023-08-31 ENCOUNTER — Ambulatory Visit (INDEPENDENT_AMBULATORY_CARE_PROVIDER_SITE_OTHER): Payer: Self-pay | Admitting: Audiology

## 2023-08-31 VITALS — BP 181/71 | HR 64

## 2023-08-31 DIAGNOSIS — G510 Bell's palsy: Secondary | ICD-10-CM | POA: Diagnosis not present

## 2023-08-31 DIAGNOSIS — H903 Sensorineural hearing loss, bilateral: Secondary | ICD-10-CM

## 2023-08-31 DIAGNOSIS — H7011 Chronic mastoiditis, right ear: Secondary | ICD-10-CM

## 2023-08-31 DIAGNOSIS — H9190 Unspecified hearing loss, unspecified ear: Secondary | ICD-10-CM

## 2023-08-31 NOTE — Progress Notes (Unsigned)
 Patient ID: [name] [name], male   DOB: Dec 28, 1934, 89 y.o.   MRN: [id number]  Follow up: Chronic mastoiditis, otitis externa, facial paralysis, hearing loss  HPI: The patient is an 89 year old male who returns today for his follow-up evaluation. 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 The patient also developed right facial nerve paralysis, secondary to his infection. The patient returns today reporting no new difficulty since his last visit. He still has difficulty with his hearing, even with his hearing aids. No otalgia or otorrhea is noted. No other ENT, GI, or respiratory issue noted since the last visit.   Exam: General: Communicates with some difficulty, well nourished, no acute distress. Head: Normocephalic, no evidence injury, no tenderness, facial buttresses intact without stepoff. Eyes: PERRL, EOMI. No scleral icterus, conjunctivae clear. Right facial paralysis is noted. No nystagmus at any point of gaze. Ears: Auricles well formed without lesions. The right mastoid is nontender. The left ear is normal. Nose: External evaluation reveals normal support and skin without lesions. Dorsum is intact. Anterior rhinoscopy reveals mildly congested mucosa over anterior aspect of inferior turbinates and intact septum. No purulence noted. Oral:  Oral cavity and oropharynx are intact, symmetric, without erythema or edema. Mucosa is moist without lesions. Neck: Full range of motion without pain. There is no significant lymphadenopathy. No masses palpable. Thyroid  bed within normal limits to palpation. Parotid glands and submandibular glands equal bilaterally without mass. Trachea is midline. Gait wide based. Vestibular: No nystagmus at any point of gaze. CN 2-12 are otherwise normal. The cerebellar examination is unremarkable.   Assessment: 1. The patient is doing well status post multiple right mastoidectomy  procedures. No infection is noted today.  2. The patient continues to have right facial nerve paralysis.  3.  Subjectively stable bilateral severe sensorineural hearing loss.  Plan: 1. The physical exam findings are reviewed with the patient.  2.  Dry ear precautions on the right side. 3.  Continue the use of his hearing aids. 4.  Recheck in 12 months.

## 2023-08-31 NOTE — Progress Notes (Signed)
  [address] [place], [place] [address] [phone]  Hearing Aid Check     KAIEN PEZZULLO comes for a scheduled appointment for a hearing aid check.   Accompanied IO:[id number] to the booth   Right Left  Hearing aid manufacturer Phonak Naida P70-UP SN: [id number] Marlea Silvers P70-UP SN: [id number]  Hearing aid style Behind the ear Behind the ear  Hearing aid battery [id number]  Receiver N/A N/A  Dome/ custom earpiece Patient uses a CFA 43M tubing attached to an adult sized EAR Link insert tip. Patient uses a CFA 43M tubing attached to a jumbo sized EAR Link insert tip.  Warranty expiration date 05-21-2022 05-21-2022  Loss and Damage expired expired  Additional accessories Expiration date    Initial fitting date 03-07-2020 03-07-2020  Device was fit at: [facility]  OF NOTE: Patient was oriented about ideally using custom earmolds with the BTE hearing aids. Patient said he liked the foam tips better. Attempted  using the "foam test tips in #13 heavy wall tubing", but it did not fit well in his ears.   Chief complaint: Patient reports needs more insert tips and  CFA 43M tubing.   Actions taken: Changed the right side for the patient. Provided patient with some supplies. He says he wants more, I will have to order more.   Of note- When I went online and saw the cost I decided to confirm with the patient if he wants to have those ordered and how many and the extra fee it will be.   Services fee: $30 was paid at checkout.   Recommend: Return for a hearing aid check , as needed. Return for a hearing evaluation and to see an ENT, if concerns with hearing changes arise.    Myrka Sylva MARIE LEROUX-MARTINEZ, AUD

## 2023-09-01 DIAGNOSIS — H7011 Chronic mastoiditis, right ear: Secondary | ICD-10-CM | POA: Insufficient documentation

## 2023-09-01 DIAGNOSIS — H903 Sensorineural hearing loss, bilateral: Secondary | ICD-10-CM | POA: Insufficient documentation

## 2023-09-27 ENCOUNTER — Ambulatory Visit: Attending: Cardiology | Admitting: *Deleted

## 2023-09-27 DIAGNOSIS — I4821 Permanent atrial fibrillation: Secondary | ICD-10-CM | POA: Insufficient documentation

## 2023-09-27 DIAGNOSIS — Z5181 Encounter for therapeutic drug level monitoring: Secondary | ICD-10-CM | POA: Insufficient documentation

## 2023-09-27 LAB — POCT INR: INR: 2.4 (ref 2.0–3.0)

## 2023-09-27 NOTE — Patient Instructions (Signed)
 Continue warfarin 1 1/2 tablets daily except 1 tablet on Fridays Recheck in 6 wks

## 2023-10-07 DIAGNOSIS — E1142 Type 2 diabetes mellitus with diabetic polyneuropathy: Secondary | ICD-10-CM | POA: Diagnosis not present

## 2023-10-07 DIAGNOSIS — L84 Corns and callosities: Secondary | ICD-10-CM | POA: Diagnosis not present

## 2023-10-07 DIAGNOSIS — B351 Tinea unguium: Secondary | ICD-10-CM | POA: Diagnosis not present

## 2023-10-15 ENCOUNTER — Ambulatory Visit (INDEPENDENT_AMBULATORY_CARE_PROVIDER_SITE_OTHER): Admitting: Audiology

## 2023-10-18 ENCOUNTER — Other Ambulatory Visit: Payer: Self-pay | Admitting: Urology

## 2023-10-18 DIAGNOSIS — N400 Enlarged prostate without lower urinary tract symptoms: Secondary | ICD-10-CM

## 2023-10-18 DIAGNOSIS — R3915 Urgency of urination: Secondary | ICD-10-CM

## 2023-10-19 DIAGNOSIS — E119 Type 2 diabetes mellitus without complications: Secondary | ICD-10-CM | POA: Diagnosis not present

## 2023-10-19 DIAGNOSIS — Z299 Encounter for prophylactic measures, unspecified: Secondary | ICD-10-CM | POA: Diagnosis not present

## 2023-10-19 DIAGNOSIS — I4891 Unspecified atrial fibrillation: Secondary | ICD-10-CM | POA: Diagnosis not present

## 2023-10-19 DIAGNOSIS — C9111 Chronic lymphocytic leukemia of B-cell type in remission: Secondary | ICD-10-CM | POA: Diagnosis not present

## 2023-10-19 DIAGNOSIS — I10 Essential (primary) hypertension: Secondary | ICD-10-CM | POA: Diagnosis not present

## 2023-10-27 ENCOUNTER — Ambulatory Visit (INDEPENDENT_AMBULATORY_CARE_PROVIDER_SITE_OTHER): Payer: Medicare Other | Admitting: Urology

## 2023-10-27 ENCOUNTER — Encounter: Payer: Self-pay | Admitting: Urology

## 2023-10-27 VITALS — BP 147/57 | HR 71

## 2023-10-27 DIAGNOSIS — R3915 Urgency of urination: Secondary | ICD-10-CM

## 2023-10-27 DIAGNOSIS — N400 Enlarged prostate without lower urinary tract symptoms: Secondary | ICD-10-CM

## 2023-10-27 MED ORDER — FINASTERIDE 5 MG PO TABS
5.0000 mg | ORAL_TABLET | Freq: Every morning | ORAL | 3 refills | Status: AC
Start: 2023-10-27 — End: ?

## 2023-10-27 MED ORDER — TAMSULOSIN HCL 0.4 MG PO CAPS
0.4000 mg | ORAL_CAPSULE | Freq: Every day | ORAL | 3 refills | Status: AC
Start: 2023-10-27 — End: ?

## 2023-10-27 NOTE — Progress Notes (Signed)
 10/27/2023 3:04 PM   [name] [name] 10-31-1934 [id number]  Referring provider: Maree Isles, MD [address] [place],  [place] [address]  No chief complaint on file.   HPI: [name] [name] is a 89yo here for followup for BPH and urinary urgency. IPSS 7 QOl 1 on flomax  0.4mg  and finasteride . Urine stream strong.  No straining to urinate. Nocturia 1-2x. His urinary urgency is now rare on flomax  0.4mg  daily. No dysuria or hematuria.    PMH: Past Medical History:  Diagnosis Date   Anemia    Aortic stenosis    mild   Arthritis    Atrial fibrillation (HCC)    Bell's palsy    right side of face, noticable when he smalls   CHF (congestive heart failure) (HCC)    Chronic lymphocytic leukemia (HCC)    Coronary atherosclerosis of native coronary artery    Nonobstructive   Dysrhythmia    A.fib   Essential hypertension    Heart murmur    History of kidney stones    HOH (hard of hearing)    Hyperlipidemia    IgA nephropathy    IgA nephropathy    Type 2 diabetes mellitus (HCC)    UTI (urinary tract infection)     Surgical History: Past Surgical History:  Procedure Laterality Date   AMPUTATION Left 09/15/2012   Procedure: PARTIAL AMPUTATION 3rd TOE LEFT FOOT;  Surgeon: Morene Donley Anon, DPM;  Location: [facility];  Service: Orthopedics;  Laterality: Left;   AMPUTATION Left 04/20/2013   Procedure: PARTIAL AMPUTATION 2ND TOE LEFT FOOT;  Surgeon: Morene Donley Anon, DPM;  Location: [facility];  Service: Orthopedics;  Laterality: Left;   ANAL FISSURE REPAIR     APPLICATION OF WOUND VAC Right 09/09/2016   Procedure: APPLICATION OF WOUND VAC;  Surgeon: Anon Morene, DPM;  Location: [facility];  Service: Podiatry;  Laterality: Right;   CATARACT EXTRACTION W/PHACO Right 09/30/2015   Procedure: CATARACT EXTRACTION PHACO AND INTRAOCULAR LENS PLACEMENT (IOC);  Surgeon: Cherene Mania, MD;  Location: [facility];  Service: Ophthalmology;  Laterality: Right;  CDE: 11.78   CATARACT EXTRACTION W/PHACO Left  10/31/2015   Procedure: CATARACT EXTRACTION PHACO AND INTRAOCULAR LENS PLACEMENT LEFT EYE; CDE:  9.10;  Surgeon: Cherene Mania, MD;  Location: [facility];  Service: Ophthalmology;  Laterality: Left;   COLONOSCOPY  08/19/2011   Procedure: COLONOSCOPY;  Surgeon: Claudis RAYMOND Rivet, MD;  [facility];  Service: Endoscopy;  Laterality: N/A;  930   COLONOSCOPY N/A 09/19/2014   Procedure: COLONOSCOPY;  Surgeon: Claudis RAYMOND Rivet, MD;  [facility];  Service: Endoscopy;  Laterality: N/A;  1055   FEMORAL HERNIA REPAIR     INCISION AND DRAINAGE Right 09/09/2016   Procedure: DEBRIDEMENT WOUND RT heel with wound vac attachment;  Surgeon: Anon Morene, DPM;  Location: [facility];  Service: Podiatry;  Laterality: Right;   INCISION AND DRAINAGE ABSCESS Right 02/16/2018   Procedure: INCISION AND DRAINAGE ABSCESS;  Surgeon: Karis Clunes, MD;  Location: [facility];  Service: ENT;  Laterality: Right;   INCISION AND DRAINAGE OF WOUND Right 08/12/2016   Procedure: DEBRIDEMENT WOUND RIGHT HEEL;  Surgeon: Anon Morene, DPM;  Location: [facility];  Service: Podiatry;  Laterality: Right;  right heel   MASS EXCISION Right 09/28/2017   Procedure: EXCISION OF EXTERNAL AUDITORY CANAL MASS;  Surgeon: Karis Clunes, MD;  Location: [facility];  Service: ENT;  Laterality: Right;   MASTOIDECTOMY Right 02/16/2018   Procedure: REVISION MASTOIDECTOMY;  Surgeon:  Karis Clunes, MD;  Location: [facility];  Service: ENT;  Laterality: Right;   Open repair left quadriceps tendon.  08/22/2007   Dr. Margrette   Right total knee replacement     TONSILLECTOMY     TRANSURETHRAL RESECTION OF PROSTATE     TYMPANOMASTOIDECTOMY Right 11/10/2017   Procedure: RIGHT TYMPANOMASTOIDECTOMY;  Surgeon: Karis Clunes, MD;  Location: [facility];  Service: ENT;  Laterality: Right;   UMBILICAL HERNIA REPAIR N/A 12/29/2022   Procedure: OPEN UMBILICAL HERNIA REPAIR WITH MESH;  Surgeon: Vernetta Berg, MD;  Location: [facility];  Service:  General;  Laterality: N/A;   WOUND EXPLORATION Right 08/12/2016   Procedure: EXPLORATION OF WOUND FOR FOREIGN BODY RIGHT HEEL;  Surgeon: Blinda Katz, DPM;  Location: [facility];  Service: Podiatry;  Laterality: Right;  right heel    Home Medications:  Allergies as of 10/27/2023       Reactions   Penicillins Hives, Other (See Comments)   Has tolerated Rocephin    Erythromycin Rash        Medication List        Accurate as of October 27, 2023  3:04 PM. If you have any questions, ask your nurse or doctor.          Accu-Chek SmartView test strip Generic drug: glucose blood USE 1 STRIP TO CHECK GLUCOSE THREE TIMES DAILY FOR FLUCTUATING BLOOD SUGAR   acetaminophen  500 MG tablet Commonly known as: TYLENOL  Take 1,000 mg by mouth every 6 (six) hours as needed for moderate pain.   amLODipine  10 MG tablet Commonly known as: NORVASC  Take 10 mg by mouth daily.   atorvastatin  10 MG tablet Commonly known as: LIPITOR Take 10 mg by mouth every evening.   Calcium  Carb-Cholecalciferol  600-800 MG-UNIT Tabs Take 2 tablets by mouth daily with breakfast.   cholecalciferol  1000 units tablet Commonly known as: VITAMIN D  Take 1,000 Units by mouth every morning.   ciclopirox  0.77 % cream Commonly known as: LOPROX  Apply 1 application  topically daily as needed (fungus). APPLY TO THE FEET   Co Q-10 100 MG Caps Take 100 mg by mouth daily with breakfast.   digoxin  0.125 MG tablet Commonly known as: LANOXIN  Take 0.125 mg by mouth daily with breakfast.   finasteride  5 MG tablet Commonly known as: PROSCAR  TAKE 1 TABLET BY MOUTH EVERY  MORNING   Fish Oil 1200 MG Caps Take 2 capsules by mouth 2 (two) times daily.   furosemide  40 MG tablet Commonly known as: LASIX  Take 40 mg by mouth daily.   HumaLOG KwikPen 100 UNIT/ML KwikPen Generic drug: insulin  lispro Inject 4-10 Units into the skin 3 (three) times daily before meals.   lisinopril  40 MG tablet Commonly known as:  ZESTRIL  Take 40 mg by mouth daily.   LORazepam  1 MG tablet Commonly known as: ATIVAN  Take 1 mg by mouth at bedtime.   multivitamin with minerals Tabs tablet Take 1 tablet by mouth daily with breakfast.   Ozempic (1 MG/DOSE) 2 MG/1.5ML Sopn Generic drug: Semaglutide (1 MG/DOSE) Inject 1 mg into the skin every Saturday.   Ozempic (1 MG/DOSE) 4 MG/3ML Sopn Generic drug: Semaglutide (1 MG/DOSE)   polyethylene glycol 17 g packet Commonly known as: MIRALAX  / GLYCOLAX  Take 17 g by mouth 2 (two) times daily.   potassium chloride  SA 20 MEQ tablet Commonly known as: KLOR-CON  M Take 40 mEq by mouth daily.   tamsulosin  0.4 MG Caps capsule Commonly known as: FLOMAX  TAKE 1 CAPSULE BY MOUTH DAILY  AFTER SUPPER   traMADol  50 MG tablet Commonly known as: Ultram  Take 1 tablet (50 mg total) by mouth every 6 (six) hours as needed for moderate pain or severe pain.   Tresiba 100 UNIT/ML Soln Generic drug: Insulin  Degludec Inject 30 Units/day into the skin daily at 6 (six) AM.   triamcinolone cream 0.1 % Commonly known as: KENALOG Apply 1 Application topically 2 (two) times daily as needed (rash).   vitamin C  1000 MG tablet Take 1,000 mg by mouth daily with breakfast.   vitamin E  180 MG (400 UNITS) capsule Take 400 Units by mouth every morning.   warfarin 5 MG tablet Commonly known as: COUMADIN  Take as directed by the anticoagulation clinic. If you are unsure how to take this medication, talk to your nurse or doctor. Original instructions: TAKE 1 TO 1 1/2 TABLETS BY MOUTH  AS DIRECTED BY ANTICOAGULATION  CLINIC        Allergies:  Allergies  Allergen Reactions   Penicillins Hives and Other (See Comments)    Has tolerated Rocephin    Erythromycin Rash    Family History: Family History  Problem Relation Age of Onset   Atrial fibrillation Mother     Social History:  reports that he quit smoking about 60 years ago. His smoking use included cigarettes. He started smoking about  70 years ago. He has never used smokeless tobacco. He reports that he does not drink alcohol  and does not use drugs.  ROS: All other review of systems were reviewed and are negative except what is noted above in HPI  Physical Exam: BP (!) 147/57   Pulse 71   Constitutional:  Alert and oriented, No acute distress. HEENT: [place]  AT, moist mucus membranes.  Trachea midline, no masses. Cardiovascular: No clubbing, cyanosis, or edema. Respiratory: Normal respiratory effort, no increased work of breathing. GI: Abdomen is soft, nontender, nondistended, no abdominal masses GU: No CVA tenderness.  Lymph: No cervical or inguinal lymphadenopathy. Skin: No rashes, bruises or suspicious lesions. Neurologic: Grossly intact, no focal deficits, moving all 4 extremities. Psychiatric: Normal mood and affect.  Laboratory Data: Lab Results  Component Value Date   WBC 10.3 12/30/2022   HGB 12.3 (L) 12/30/2022   HCT 38.6 (L) 12/30/2022   MCV 90.2 12/30/2022   PLT 131 (L) 12/30/2022    Lab Results  Component Value Date   CREATININE 0.80 12/30/2022    No results found for: PSA  No results found for: TESTOSTERONE  Lab Results  Component Value Date   HGBA1C 6.2 (H) 12/22/2022    Urinalysis    Component Value Date/Time   COLORURINE YELLOW 10/23/2022 1958   APPEARANCEUR CLEAR 10/23/2022 1958   APPEARANCEUR Clear 10/21/2022 1412   LABSPEC 1.004 (L) 10/23/2022 1958   PHURINE 7.0 10/23/2022 1958   GLUCOSEU NEGATIVE 10/23/2022 1958   HGBUR NEGATIVE 10/23/2022 1958   BILIRUBINUR NEGATIVE 10/23/2022 1958   BILIRUBINUR Negative 10/21/2022 1412   KETONESUR NEGATIVE 10/23/2022 1958   PROTEINUR NEGATIVE 10/23/2022 1958   NITRITE NEGATIVE 10/23/2022 1958   LEUKOCYTESUR NEGATIVE 10/23/2022 1958    Lab Results  Component Value Date   LABMICR See below: 10/21/2022   WBCUA 0-5 10/21/2022   LABEPIT 0-10 10/21/2022   MUCUS Present 10/15/2021   BACTERIA None seen 10/21/2022    Pertinent  Imaging:  No results found for this or any previous visit.  No results found for this or any previous visit.  No results found for this or any previous visit.  No results  found for this or any previous visit.  Results for orders placed during the hospital encounter of 06/20/16  US  Renal  Narrative CLINICAL DATA:  Gross hematuria and urinary tract infections  EXAM: RENAL / URINARY TRACT ULTRASOUND COMPLETE  COMPARISON:  None.  FINDINGS: Right Kidney:  Length: 13.6 cm. Echogenicity and renal cortical thickness are within normal limits. No perinephric fluid or hydronephrosis visualized. There is a cyst arising from the upper pole the right kidney measuring 5.3 x 3.9 x 4.3 cm. A cyst arising from the lower pole of the right kidney measures 2.3 x 2.6 x 2.7 cm. No sonographically demonstrable calculus or ureterectasis.  Left Kidney:  Length: 13.2 cm. Echogenicity and renal cortical thickness are within normal limits. No perinephric fluid or hydronephrosis visualized. There is a cyst arising from the upper pole of the left kidney measuring 2.6 x 2.4 x 3.7 cm. There is a nearby cyst measuring 1.9 x 1.7 x 1.7 cm. No sonographically demonstrable calculus or ureterectasis.  Bladder:  There is irregular debris noted in the urinary bladder. Urinary bladder wall appears mildly thickened. Flow from the distal ureters is seen in the bladder. The patient was unable to void.  IMPRESSION: Complex debris noted within the urinary bladder with mild urinary bladder wall thickening. Suspect cystitis. Note that patient was unable to void; a finding concerning for bladder outlet obstruction. Etiology for bladder outlet obstruction uncertain.  Cysts noted in each kidney.  No obstructing focus in either kidney.   Electronically Signed By: Elsie Repine III M.D. On: 06/23/2016 11:18  No results found for this or any previous visit.  No results found for this or any previous  visit.  No results found for this or any previous visit.   Assessment & Plan:    1. Benign prostatic hyperplasia, unspecified whether lower urinary tract symptoms present (Primary) Continue flomax  0.4mg  daily and finasteride  5mg  daily - BLADDER SCAN AMB NON-IMAGING  2. Urinary urgency Continue flomax  0.4mg  daily and finsteride 5mg  daily   No follow-ups on file.  Belvie Clara, MD  [facility] [place]

## 2023-10-27 NOTE — Patient Instructions (Signed)

## 2023-10-28 ENCOUNTER — Ambulatory Visit: Payer: Medicare Other | Attending: Cardiology

## 2023-10-28 DIAGNOSIS — I350 Nonrheumatic aortic (valve) stenosis: Secondary | ICD-10-CM | POA: Insufficient documentation

## 2023-11-01 ENCOUNTER — Ambulatory Visit: Payer: Self-pay | Admitting: Cardiology

## 2023-11-01 LAB — ECHOCARDIOGRAM COMPLETE
AR max vel: 1.28 cm2
AV Area VTI: 1.32 cm2
AV Area mean vel: 1.56 cm2
AV Mean grad: 25.3 mmHg
AV Peak grad: 51.3 mmHg
AV Vena cont: 0.7 cm
Ao pk vel: 3.58 m/s
Calc EF: 57.3 %
MV VTI: 3.25 cm2
P 1/2 time: 452 ms
S' Lateral: 3.2 cm
Single Plane A2C EF: 54.3 %
Single Plane A4C EF: 60.5 %

## 2023-11-08 ENCOUNTER — Ambulatory Visit: Attending: Cardiology | Admitting: *Deleted

## 2023-11-08 DIAGNOSIS — I4821 Permanent atrial fibrillation: Secondary | ICD-10-CM | POA: Insufficient documentation

## 2023-11-08 DIAGNOSIS — Z5181 Encounter for therapeutic drug level monitoring: Secondary | ICD-10-CM | POA: Insufficient documentation

## 2023-11-08 LAB — POCT INR: INR: 2.2 (ref 2.0–3.0)

## 2023-11-08 MED ORDER — WARFARIN SODIUM 5 MG PO TABS: ORAL_TABLET | ORAL | 2 refills | Status: AC

## 2023-11-08 NOTE — Patient Instructions (Signed)
 Continue warfarin 1 1/2 tablets daily except 1 tablet on Fridays Recheck in 6 wks

## 2023-11-15 ENCOUNTER — Ambulatory Visit (INDEPENDENT_AMBULATORY_CARE_PROVIDER_SITE_OTHER): Admitting: Audiology

## 2023-11-15 DIAGNOSIS — H9042 Sensorineural hearing loss, unilateral, left ear, with unrestricted hearing on the contralateral side: Secondary | ICD-10-CM | POA: Diagnosis not present

## 2023-11-15 DIAGNOSIS — H9071 Mixed conductive and sensorineural hearing loss, unilateral, right ear, with unrestricted hearing on the contralateral side: Secondary | ICD-10-CM | POA: Diagnosis not present

## 2023-11-15 DIAGNOSIS — H90A22 Sensorineural hearing loss, unilateral, left ear, with restricted hearing on the contralateral side: Secondary | ICD-10-CM

## 2023-11-15 DIAGNOSIS — H90A31 Mixed conductive and sensorineural hearing loss, unilateral, right ear with restricted hearing on the contralateral side: Secondary | ICD-10-CM

## 2023-11-15 NOTE — Progress Notes (Signed)
 INR 2.2; Please see anticoagulation encounter

## 2023-11-15 NOTE — Progress Notes (Signed)
  [address], [address] [place], [place] [address] [phone]  Audiological Evaluation    Name: [name] [name]     DOB:   Mar 04, 1935      MRN:   [id number]                                                                                     Service Date: 11/15/2023     Accompanied by: unaccompanied to the booth   Patient comes today after Dr. Karis, ENT sent a referral for a hearing evaluation due to concerns with hearing loss.   Symptoms Yes Details  Hearing loss  [x]  Known hearing loss in both ears. Previous audiohgram was completed on 08-27-22 at Dr. Rojean clinic  Tinnitus  []    Ear pain/ infections/pressure  []    Balance problems  []    Noise exposure history  []    Previous ear surgeries  []    Family history of hearing loss  []    Amplification  [x]  Has a set of hearing aids that were fit at Dr.Teoh's clinic  Other  []      Otoscopy: Right ear: Clear external ear canal and notable landmarks visualized on the tympanic membrane. Surgical right external ear canal. Left ear:  Clear external ear canal and notable landmarks visualized on the tympanic membrane.  Tympanometry: Right ear: Could not obtain seal; middle ear status is unable to be determined at this time.. Left ear: Type A- Normal external ear canal volume with normal middle ear pressure and tympanic membrane compliance.     Pure tone Audiometry: Right ear- Moderately severe to severe mixed hearing loss from 125 Hz - 8000 Hz. Left ear-  Mild to severe sensorineural hearing loss from 125 Hz - 8000 Hz.  Speech Audiometry: Right ear- Speech Reception Threshold (SRT) was obtained at 80 dBHL. Left ear-Speech Reception Threshold (SRT) was obtained at 70 dBHL.   Word Recognition Score Tested using NU-6 (recorded) Right ear: 60% was obtained at a presentation level of 100 dBHL with contralateral masking which is deemed as  poor. Left ear: 60% was obtained at a presentation level of 100 dBHL with contralateral masking  which is deemed as  poor.   The hearing test results were completed under headphones and results are deemed to be of good to fair reliability. Test technique:  conventional    Impression: There is not a significant difference in pure-tone thresholds between ears., There is not a significant difference in the word recognition score in between ears.  Hearing seems stable when compared to audiogram from 08-27-22.   Recommendations: Follow up with ENT as scheduled for today. Return for a hearing evaluation if concerns with hearing changes arise or per MD recommendation.   Curlee Bogan MARIE LEROUX-MARTINEZ, AUD

## 2023-11-23 ENCOUNTER — Encounter: Payer: Self-pay | Admitting: Audiology

## 2023-12-01 ENCOUNTER — Other Ambulatory Visit (HOSPITAL_BASED_OUTPATIENT_CLINIC_OR_DEPARTMENT_OTHER): Payer: Self-pay

## 2023-12-01 ENCOUNTER — Encounter: Payer: Self-pay | Admitting: Cardiology

## 2023-12-01 ENCOUNTER — Ambulatory Visit: Attending: Cardiology | Admitting: Cardiology

## 2023-12-01 VITALS — BP 132/62 | HR 60 | Ht 72.0 in | Wt 260.2 lb

## 2023-12-01 DIAGNOSIS — I4821 Permanent atrial fibrillation: Secondary | ICD-10-CM | POA: Diagnosis not present

## 2023-12-01 DIAGNOSIS — E782 Mixed hyperlipidemia: Secondary | ICD-10-CM | POA: Insufficient documentation

## 2023-12-01 DIAGNOSIS — I350 Nonrheumatic aortic (valve) stenosis: Secondary | ICD-10-CM | POA: Insufficient documentation

## 2023-12-01 DIAGNOSIS — I10 Essential (primary) hypertension: Secondary | ICD-10-CM | POA: Insufficient documentation

## 2023-12-01 MED ORDER — FUROSEMIDE 40 MG PO TABS
60.0000 mg | ORAL_TABLET | Freq: Every day | ORAL | 3 refills | Status: AC
  Filled 2023-12-01: qty 135, 90d supply, fill #0

## 2023-12-01 NOTE — Progress Notes (Signed)
 Cardiology Office Note  Date: 12/01/2023   ID: THEON SOBOTKA, DOB October 27, 1934, MRN [id number]  History of Present Illness: [name] [name] is an 89 y.o. male last seen in January.  He is here for a follow-up visit with his daughter.  He does not report any worsening shortness of breath, no sense of palpitations or chest pain.  Has had somewhat more swelling in his left lower leg, this has been chronic in the setting of venous stasis as well.  We discussed his medications which are overall stable from a cardiac perspective.  He does not report any spontaneous bleeding problems on Coumadin  with recent INR in therapeutic range.  Heart rate is well-controlled today.  I reviewed his ECG today which shows rate controlled atrial fibrillation with right bundle branch block and left anterior fascicular block.  We did go over the results of his echocardiogram.  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  Physical Exam: VS:  BP 132/62 (BP Location: Left Arm)   Pulse 60   Ht 6' (1.829 m)   Wt 260 lb 3.2 oz (118 kg)   SpO2 98%   BMI 35.29 kg/m , BMI Body mass index is 35.29 kg/m.  Wt Readings from Last 3 Encounters:  12/01/23 260 lb 3.2 oz (118 kg)  05/03/23 244 lb 9.6 oz (110.9 kg)  12/29/22 260 lb (117.9 kg)    General: Patient appears comfortable at rest. HEENT: Conjunctiva and lids normal. Neck: Supple, no elevated JVP or carotid bruits. Lungs: Clear to auscultation, nonlabored breathing at rest. Cardiac: Irregularly irregular, 2/6 systolic murmur without gallop. Extremities: Lower leg edema, left greater than right and with associated venous stasis.  ECG:  An ECG dated 10/23/2022 was personally reviewed today and demonstrated:  Atrial fibrillation with right bundle branch block, left anterior fascicular block, PVC.  Labwork: 12/30/2022: BUN 22; Creatinine, Ser 0.80; Hemoglobin 12.3; Platelets 131; Potassium 4.2;  Sodium 138   Other Studies Reviewed Today:  Echocardiogram 10/28/2023:  1. Left ventricular ejection fraction, by estimation, is 60 to 65%. The  left ventricle has normal function. The left ventricle has no regional  wall motion abnormalities. There is mild concentric left ventricular  hypertrophy. Left ventricular diastolic  parameters are indeterminate.   2. Right ventricular systolic function is normal. The right ventricular  size is normal. There is severely elevated pulmonary artery systolic  pressure. The estimated right ventricular systolic pressure is 65.8 mmHg.   3. Left atrial size was severely dilated.   4. Right atrial size was mildly dilated.   5. The mitral valve is degenerative. Mild mitral valve regurgitation. The  mean mitral valve gradient is 3.0 mmHg.   6. Tricuspid valve regurgitation is mild to moderate.   7. The aortic valve is tricuspid. There is moderate calcification of the  aortic valve. Aortic valve regurgitation is mild. Moderate aortic valve  stenosis. Aortic regurgitation PHT measures 452 msec. Aortic valve mean  gradient measures 25.3 mmHg.  Dimentionless index 0.32.   8. The inferior vena cava is dilated in size with >50% respiratory  variability, suggesting right atrial pressure of 8 mmHg.   Assessment and Plan:  1.  Permanent atrial fibrillation with CHA2DS2-VASc score of 5.  He remains asymptomatic and continues with strategy of heart rate control and anticoagulation, currently on Lanoxin  and Coumadin .  Recent INR 2.2.  No changes made today.   2.  Degenerative calcific aortic stenosis,  moderate by echocardiogram done recently in July.  Mean AV gradient up to 25 mmHg and dimensionless index 0.32.  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  We will plan to update echocardiogram next year, clinical visit in 6 months.  Increase Lasix  to 60 mg daily for now with increasing leg edema.  3.  Primary hypertension.  Continue  Norvasc  10 mg daily and lisinopril  40 mg daily.   4.  Mixed hyperlipidemia.  LDL 96 and HDL 47 in October 2024.  Currently on Lipitor 10 mg daily.  Disposition:  Follow up 6 months.  Signed, Jayson JUDITHANN Sierras, M.D., F.A.C.C. [facility]

## 2023-12-01 NOTE — Patient Instructions (Addendum)
 Medication Instructions:  Your physician has recommended you make the following change in your medication: Increase furosemide  to 60 mg daily Continue all other medications as prescribed.  Labwork: none  Testing/Procedures: none  Follow-Up: Your physician recommends that you schedule a follow-up appointment in: 6 months  Any Other Special Instructions Will Be Listed Below (If Applicable).  If you need a refill on your cardiac medications before your next appointment, please call your pharmacy.

## 2023-12-13 DIAGNOSIS — L573 Poikiloderma of Civatte: Secondary | ICD-10-CM | POA: Diagnosis not present

## 2023-12-13 DIAGNOSIS — D485 Neoplasm of uncertain behavior of skin: Secondary | ICD-10-CM | POA: Diagnosis not present

## 2023-12-13 DIAGNOSIS — L814 Other melanin hyperpigmentation: Secondary | ICD-10-CM | POA: Diagnosis not present

## 2023-12-13 DIAGNOSIS — D0421 Carcinoma in situ of skin of right ear and external auricular canal: Secondary | ICD-10-CM | POA: Diagnosis not present

## 2023-12-20 ENCOUNTER — Ambulatory Visit: Attending: Cardiology | Admitting: *Deleted

## 2023-12-20 DIAGNOSIS — Z5181 Encounter for therapeutic drug level monitoring: Secondary | ICD-10-CM | POA: Diagnosis not present

## 2023-12-20 DIAGNOSIS — I4821 Permanent atrial fibrillation: Secondary | ICD-10-CM | POA: Insufficient documentation

## 2023-12-20 LAB — POCT INR: INR: 2.9 (ref 2.0–3.0)

## 2023-12-20 NOTE — Patient Instructions (Signed)
 Continue warfarin 1 1/2 tablets daily except 1 tablet on Fridays Recheck in 6 wks

## 2023-12-20 NOTE — Progress Notes (Signed)
 INR 2.9; Please see anticoagulation encounter

## 2023-12-23 DIAGNOSIS — B351 Tinea unguium: Secondary | ICD-10-CM | POA: Diagnosis not present

## 2023-12-23 DIAGNOSIS — L84 Corns and callosities: Secondary | ICD-10-CM | POA: Diagnosis not present

## 2023-12-23 DIAGNOSIS — E1142 Type 2 diabetes mellitus with diabetic polyneuropathy: Secondary | ICD-10-CM | POA: Diagnosis not present

## 2024-01-11 DIAGNOSIS — Z1331 Encounter for screening for depression: Secondary | ICD-10-CM | POA: Diagnosis not present

## 2024-01-11 DIAGNOSIS — I350 Nonrheumatic aortic (valve) stenosis: Secondary | ICD-10-CM | POA: Diagnosis not present

## 2024-01-11 DIAGNOSIS — Z1339 Encounter for screening examination for other mental health and behavioral disorders: Secondary | ICD-10-CM | POA: Diagnosis not present

## 2024-01-11 DIAGNOSIS — Z0000 Encounter for general adult medical examination without abnormal findings: Secondary | ICD-10-CM | POA: Diagnosis not present

## 2024-01-11 DIAGNOSIS — Z79899 Other long term (current) drug therapy: Secondary | ICD-10-CM | POA: Diagnosis not present

## 2024-01-11 DIAGNOSIS — E7800 Pure hypercholesterolemia, unspecified: Secondary | ICD-10-CM | POA: Diagnosis not present

## 2024-01-11 DIAGNOSIS — Z299 Encounter for prophylactic measures, unspecified: Secondary | ICD-10-CM | POA: Diagnosis not present

## 2024-01-11 DIAGNOSIS — Z7189 Other specified counseling: Secondary | ICD-10-CM | POA: Diagnosis not present

## 2024-01-11 DIAGNOSIS — Z125 Encounter for screening for malignant neoplasm of prostate: Secondary | ICD-10-CM | POA: Diagnosis not present

## 2024-01-11 DIAGNOSIS — I10 Essential (primary) hypertension: Secondary | ICD-10-CM | POA: Diagnosis not present

## 2024-01-11 DIAGNOSIS — R5383 Other fatigue: Secondary | ICD-10-CM | POA: Diagnosis not present

## 2024-01-11 DIAGNOSIS — Z23 Encounter for immunization: Secondary | ICD-10-CM | POA: Diagnosis not present

## 2024-01-13 DIAGNOSIS — D0421 Carcinoma in situ of skin of right ear and external auricular canal: Secondary | ICD-10-CM | POA: Diagnosis not present

## 2024-01-13 DIAGNOSIS — C44222 Squamous cell carcinoma of skin of right ear and external auricular canal: Secondary | ICD-10-CM | POA: Diagnosis not present

## 2024-01-31 ENCOUNTER — Ambulatory Visit: Attending: Cardiology | Admitting: *Deleted

## 2024-01-31 DIAGNOSIS — I4821 Permanent atrial fibrillation: Secondary | ICD-10-CM | POA: Diagnosis not present

## 2024-01-31 DIAGNOSIS — Z5181 Encounter for therapeutic drug level monitoring: Secondary | ICD-10-CM | POA: Diagnosis not present

## 2024-01-31 LAB — POCT INR: INR: 2.8 (ref 2.0–3.0)

## 2024-01-31 NOTE — Progress Notes (Signed)
 INR-2.8 Please see anticoagulation encounter

## 2024-01-31 NOTE — Patient Instructions (Signed)
 Continue warfarin 1 1/2 tablets daily except 1 tablet on Fridays Recheck in 6 wks

## 2024-02-15 DIAGNOSIS — I10 Essential (primary) hypertension: Secondary | ICD-10-CM | POA: Diagnosis not present

## 2024-02-15 DIAGNOSIS — Z299 Encounter for prophylactic measures, unspecified: Secondary | ICD-10-CM | POA: Diagnosis not present

## 2024-02-15 DIAGNOSIS — I4891 Unspecified atrial fibrillation: Secondary | ICD-10-CM | POA: Diagnosis not present

## 2024-02-15 DIAGNOSIS — J069 Acute upper respiratory infection, unspecified: Secondary | ICD-10-CM | POA: Diagnosis not present

## 2024-02-15 DIAGNOSIS — E7800 Pure hypercholesterolemia, unspecified: Secondary | ICD-10-CM | POA: Diagnosis not present

## 2024-02-15 DIAGNOSIS — E1169 Type 2 diabetes mellitus with other specified complication: Secondary | ICD-10-CM | POA: Diagnosis not present

## 2024-02-15 DIAGNOSIS — E119 Type 2 diabetes mellitus without complications: Secondary | ICD-10-CM | POA: Diagnosis not present

## 2024-03-13 ENCOUNTER — Ambulatory Visit

## 2024-03-14 DIAGNOSIS — L84 Corns and callosities: Secondary | ICD-10-CM | POA: Diagnosis not present

## 2024-03-14 DIAGNOSIS — E1142 Type 2 diabetes mellitus with diabetic polyneuropathy: Secondary | ICD-10-CM | POA: Diagnosis not present

## 2024-03-14 DIAGNOSIS — B351 Tinea unguium: Secondary | ICD-10-CM | POA: Diagnosis not present

## 2024-03-15 ENCOUNTER — Ambulatory Visit: Attending: Cardiology | Admitting: *Deleted

## 2024-03-15 DIAGNOSIS — Z5181 Encounter for therapeutic drug level monitoring: Secondary | ICD-10-CM | POA: Insufficient documentation

## 2024-03-15 DIAGNOSIS — I4821 Permanent atrial fibrillation: Secondary | ICD-10-CM | POA: Insufficient documentation

## 2024-03-15 LAB — POCT INR: INR: 3.2 — AB (ref 2.0–3.0)

## 2024-03-15 NOTE — Patient Instructions (Signed)
 Take warfarin 1/2 tablet tonight then resume 1 1/2 tablets daily except 1 tablet on Fridays Recheck in 6 wks

## 2024-03-15 NOTE — Progress Notes (Signed)
 INR 3.2 Please see anticoagulation encounter

## 2024-04-26 ENCOUNTER — Ambulatory Visit: Attending: Cardiology | Admitting: *Deleted

## 2024-04-26 DIAGNOSIS — I4821 Permanent atrial fibrillation: Secondary | ICD-10-CM | POA: Insufficient documentation

## 2024-04-26 DIAGNOSIS — Z5181 Encounter for therapeutic drug level monitoring: Secondary | ICD-10-CM | POA: Insufficient documentation

## 2024-04-26 LAB — POCT INR: INR: 2.4 (ref 2.0–3.0)

## 2024-04-26 NOTE — Patient Instructions (Signed)
 Continue warfarin 1 1/2 tablets daily except 1 tablet on Fridays Recheck in 6 wks

## 2024-04-26 NOTE — Progress Notes (Signed)
 INR 2.4.

## 2024-06-07 ENCOUNTER — Ambulatory Visit

## 2024-10-30 ENCOUNTER — Ambulatory Visit: Admitting: Urology
